# Patient Record
Sex: Female | Born: 1965 | Race: White | Hispanic: No | Marital: Married | Smoking: Never smoker
Health system: Southern US, Community
[De-identification: ages and names within clinical notes are randomized; demographics above are authoritative.]

## PROBLEM LIST (undated history)

## (undated) DIAGNOSIS — R569 Unspecified convulsions: Secondary | ICD-10-CM

## (undated) DIAGNOSIS — F419 Anxiety disorder, unspecified: Secondary | ICD-10-CM

## (undated) DIAGNOSIS — F32A Depression, unspecified: Secondary | ICD-10-CM

## (undated) DIAGNOSIS — M797 Fibromyalgia: Secondary | ICD-10-CM

## (undated) DIAGNOSIS — I209 Angina pectoris, unspecified: Secondary | ICD-10-CM

## (undated) DIAGNOSIS — M1990 Unspecified osteoarthritis, unspecified site: Secondary | ICD-10-CM

## (undated) DIAGNOSIS — Z803 Family history of malignant neoplasm of breast: Secondary | ICD-10-CM

## (undated) DIAGNOSIS — Z973 Presence of spectacles and contact lenses: Secondary | ICD-10-CM

## (undated) DIAGNOSIS — K859 Acute pancreatitis without necrosis or infection, unspecified: Secondary | ICD-10-CM

## (undated) DIAGNOSIS — Z8041 Family history of malignant neoplasm of ovary: Secondary | ICD-10-CM

## (undated) DIAGNOSIS — J189 Pneumonia, unspecified organism: Secondary | ICD-10-CM

## (undated) DIAGNOSIS — Q625 Duplication of ureter: Secondary | ICD-10-CM

## (undated) DIAGNOSIS — Z9889 Other specified postprocedural states: Secondary | ICD-10-CM

## (undated) DIAGNOSIS — R112 Nausea with vomiting, unspecified: Secondary | ICD-10-CM

## (undated) DIAGNOSIS — N200 Calculus of kidney: Secondary | ICD-10-CM

## (undated) DIAGNOSIS — E66811 Obesity, class 1: Secondary | ICD-10-CM

## (undated) DIAGNOSIS — E669 Obesity, unspecified: Secondary | ICD-10-CM

## (undated) DIAGNOSIS — Z800 Family history of malignant neoplasm of digestive organs: Secondary | ICD-10-CM

## (undated) DIAGNOSIS — T7840XA Allergy, unspecified, initial encounter: Secondary | ICD-10-CM

## (undated) DIAGNOSIS — IMO0001 Reserved for inherently not codable concepts without codable children: Secondary | ICD-10-CM

## (undated) DIAGNOSIS — I10 Essential (primary) hypertension: Secondary | ICD-10-CM

## (undated) DIAGNOSIS — Z87442 Personal history of urinary calculi: Secondary | ICD-10-CM

## (undated) DIAGNOSIS — S92009A Unspecified fracture of unspecified calcaneus, initial encounter for closed fracture: Secondary | ICD-10-CM

## (undated) DIAGNOSIS — I878 Other specified disorders of veins: Secondary | ICD-10-CM

## (undated) DIAGNOSIS — E119 Type 2 diabetes mellitus without complications: Secondary | ICD-10-CM

## (undated) DIAGNOSIS — C801 Malignant (primary) neoplasm, unspecified: Secondary | ICD-10-CM

## (undated) HISTORY — DX: Unspecified fracture of unspecified calcaneus, initial encounter for closed fracture: S92.009A

## (undated) HISTORY — PX: OTHER SURGICAL HISTORY: SHX169

## (undated) HISTORY — PX: TONSILLECTOMY: SHX5217

## (undated) HISTORY — PX: ABDOMINAL HYSTERECTOMY: SHX81

## (undated) HISTORY — PX: TONSILLECTOMY: SUR1361

## (undated) HISTORY — PX: KNEE CARTILAGE SURGERY: SHX688

## (undated) HISTORY — PX: LAMINECTOMY: SHX219

## (undated) HISTORY — DX: Family history of malignant neoplasm of digestive organs: Z80.0

## (undated) HISTORY — PX: CHOLECYSTECTOMY: SHX55

## (undated) HISTORY — DX: Obesity, class 1: E66.811

## (undated) HISTORY — DX: Allergy, unspecified, initial encounter: T78.40XA

## (undated) HISTORY — DX: Obesity, unspecified: E66.9

## (undated) HISTORY — PX: KNEE SURGERY: SHX244

## (undated) HISTORY — PX: SPINAL FUSION: SHX223

## (undated) HISTORY — DX: Family history of malignant neoplasm of ovary: Z80.41

## (undated) HISTORY — DX: Family history of malignant neoplasm of breast: Z80.3

---

## 1984-09-08 DIAGNOSIS — K859 Acute pancreatitis without necrosis or infection, unspecified: Secondary | ICD-10-CM

## 1984-09-08 DIAGNOSIS — R569 Unspecified convulsions: Secondary | ICD-10-CM

## 1984-09-08 HISTORY — DX: Acute pancreatitis without necrosis or infection, unspecified: K85.90

## 1984-09-08 HISTORY — DX: Unspecified convulsions: R56.9

## 1987-05-04 ENCOUNTER — Encounter: Payer: Self-pay | Admitting: Internal Medicine

## 1993-09-08 HISTORY — PX: BUNIONECTOMY: SHX129

## 1998-05-14 ENCOUNTER — Inpatient Hospital Stay (HOSPITAL_COMMUNITY): Admission: AD | Admit: 1998-05-14 | Discharge: 1998-05-20 | Payer: Self-pay | Admitting: Obstetrics and Gynecology

## 1998-06-27 ENCOUNTER — Encounter: Payer: Self-pay | Admitting: Urology

## 1998-06-27 ENCOUNTER — Ambulatory Visit (HOSPITAL_COMMUNITY): Admission: RE | Admit: 1998-06-27 | Discharge: 1998-06-27 | Payer: Self-pay | Admitting: Urology

## 1998-07-06 ENCOUNTER — Inpatient Hospital Stay (HOSPITAL_COMMUNITY): Admission: AD | Admit: 1998-07-06 | Discharge: 1998-07-06 | Payer: Self-pay | Admitting: Obstetrics and Gynecology

## 1998-07-10 ENCOUNTER — Inpatient Hospital Stay (HOSPITAL_COMMUNITY): Admission: AD | Admit: 1998-07-10 | Discharge: 1998-07-10 | Payer: Self-pay | Admitting: Obstetrics and Gynecology

## 1998-07-17 ENCOUNTER — Inpatient Hospital Stay (HOSPITAL_COMMUNITY): Admission: AD | Admit: 1998-07-17 | Discharge: 1998-07-17 | Payer: Self-pay | Admitting: *Deleted

## 1998-07-19 ENCOUNTER — Inpatient Hospital Stay (HOSPITAL_COMMUNITY): Admission: AD | Admit: 1998-07-19 | Discharge: 1998-07-22 | Payer: Self-pay | Admitting: Obstetrics and Gynecology

## 1998-07-19 ENCOUNTER — Inpatient Hospital Stay (HOSPITAL_COMMUNITY): Admission: AD | Admit: 1998-07-19 | Discharge: 1998-07-19 | Payer: Self-pay | Admitting: Obstetrics and Gynecology

## 1998-07-26 ENCOUNTER — Encounter (HOSPITAL_COMMUNITY): Admission: RE | Admit: 1998-07-26 | Discharge: 1998-10-24 | Payer: Self-pay | Admitting: *Deleted

## 1999-08-12 ENCOUNTER — Ambulatory Visit (HOSPITAL_COMMUNITY): Admission: RE | Admit: 1999-08-12 | Discharge: 1999-08-12 | Payer: Self-pay | Admitting: Internal Medicine

## 1999-08-12 ENCOUNTER — Encounter (INDEPENDENT_AMBULATORY_CARE_PROVIDER_SITE_OTHER): Payer: Self-pay

## 1999-08-13 ENCOUNTER — Encounter: Payer: Self-pay | Admitting: Emergency Medicine

## 1999-08-13 ENCOUNTER — Emergency Department (HOSPITAL_COMMUNITY): Admission: EM | Admit: 1999-08-13 | Discharge: 1999-08-14 | Payer: Self-pay | Admitting: Emergency Medicine

## 1999-08-23 ENCOUNTER — Inpatient Hospital Stay (HOSPITAL_COMMUNITY): Admission: EM | Admit: 1999-08-23 | Discharge: 1999-08-26 | Payer: Self-pay | Admitting: Emergency Medicine

## 1999-08-23 ENCOUNTER — Encounter (INDEPENDENT_AMBULATORY_CARE_PROVIDER_SITE_OTHER): Payer: Self-pay | Admitting: *Deleted

## 1999-08-24 ENCOUNTER — Encounter: Payer: Self-pay | Admitting: Internal Medicine

## 1999-08-24 ENCOUNTER — Encounter (INDEPENDENT_AMBULATORY_CARE_PROVIDER_SITE_OTHER): Payer: Self-pay | Admitting: *Deleted

## 1999-09-03 ENCOUNTER — Encounter: Payer: Self-pay | Admitting: Internal Medicine

## 1999-09-03 ENCOUNTER — Ambulatory Visit (HOSPITAL_COMMUNITY): Admission: RE | Admit: 1999-09-03 | Discharge: 1999-09-03 | Payer: Self-pay | Admitting: Internal Medicine

## 1999-12-29 ENCOUNTER — Emergency Department (HOSPITAL_COMMUNITY): Admission: EM | Admit: 1999-12-29 | Discharge: 1999-12-30 | Payer: Self-pay

## 2000-01-03 ENCOUNTER — Encounter: Payer: Self-pay | Admitting: Urology

## 2000-01-03 ENCOUNTER — Ambulatory Visit (HOSPITAL_COMMUNITY): Admission: RE | Admit: 2000-01-03 | Discharge: 2000-01-03 | Payer: Self-pay | Admitting: Urology

## 2003-07-27 ENCOUNTER — Encounter: Payer: Self-pay | Admitting: Internal Medicine

## 2003-08-17 ENCOUNTER — Encounter: Admission: RE | Admit: 2003-08-17 | Discharge: 2003-08-17 | Payer: Self-pay | Admitting: Internal Medicine

## 2003-08-17 ENCOUNTER — Encounter (INDEPENDENT_AMBULATORY_CARE_PROVIDER_SITE_OTHER): Payer: Self-pay | Admitting: *Deleted

## 2003-10-27 ENCOUNTER — Encounter: Payer: Self-pay | Admitting: Internal Medicine

## 2004-06-08 ENCOUNTER — Emergency Department (HOSPITAL_COMMUNITY): Admission: EM | Admit: 2004-06-08 | Discharge: 2004-06-09 | Payer: Self-pay | Admitting: Emergency Medicine

## 2005-03-02 ENCOUNTER — Emergency Department (HOSPITAL_COMMUNITY): Admission: EM | Admit: 2005-03-02 | Discharge: 2005-03-03 | Payer: Self-pay | Admitting: Emergency Medicine

## 2005-05-09 ENCOUNTER — Encounter: Admission: RE | Admit: 2005-05-09 | Discharge: 2005-05-09 | Payer: Self-pay | Admitting: Neurosurgery

## 2005-06-08 HISTORY — PX: LUMBAR LAMINECTOMY: SHX95

## 2005-06-12 ENCOUNTER — Encounter: Admission: RE | Admit: 2005-06-12 | Discharge: 2005-06-12 | Payer: Self-pay | Admitting: Neurosurgery

## 2005-07-01 ENCOUNTER — Ambulatory Visit (HOSPITAL_COMMUNITY): Admission: RE | Admit: 2005-07-01 | Discharge: 2005-07-02 | Payer: Self-pay | Admitting: Neurosurgery

## 2005-10-30 ENCOUNTER — Ambulatory Visit: Payer: Self-pay | Admitting: Family Medicine

## 2005-12-04 ENCOUNTER — Ambulatory Visit (HOSPITAL_BASED_OUTPATIENT_CLINIC_OR_DEPARTMENT_OTHER): Admission: RE | Admit: 2005-12-04 | Discharge: 2005-12-04 | Payer: Self-pay | Admitting: Urology

## 2006-04-08 ENCOUNTER — Encounter: Admission: RE | Admit: 2006-04-08 | Discharge: 2006-04-08 | Payer: Self-pay | Admitting: Neurosurgery

## 2007-08-02 ENCOUNTER — Ambulatory Visit: Payer: Self-pay | Admitting: Internal Medicine

## 2007-08-02 DIAGNOSIS — R42 Dizziness and giddiness: Secondary | ICD-10-CM | POA: Insufficient documentation

## 2007-08-02 DIAGNOSIS — J069 Acute upper respiratory infection, unspecified: Secondary | ICD-10-CM | POA: Insufficient documentation

## 2007-08-02 LAB — CONVERTED CEMR LAB: Rapid Strep: NEGATIVE

## 2007-08-03 ENCOUNTER — Telehealth (INDEPENDENT_AMBULATORY_CARE_PROVIDER_SITE_OTHER): Payer: Self-pay | Admitting: Internal Medicine

## 2007-09-06 ENCOUNTER — Ambulatory Visit (HOSPITAL_BASED_OUTPATIENT_CLINIC_OR_DEPARTMENT_OTHER): Admission: RE | Admit: 2007-09-06 | Discharge: 2007-09-06 | Payer: Self-pay | Admitting: Urology

## 2007-11-24 ENCOUNTER — Ambulatory Visit: Payer: Self-pay | Admitting: Family Medicine

## 2007-11-24 DIAGNOSIS — J309 Allergic rhinitis, unspecified: Secondary | ICD-10-CM | POA: Insufficient documentation

## 2007-11-24 DIAGNOSIS — J019 Acute sinusitis, unspecified: Secondary | ICD-10-CM | POA: Insufficient documentation

## 2007-11-26 ENCOUNTER — Encounter: Payer: Self-pay | Admitting: Internal Medicine

## 2007-11-26 DIAGNOSIS — K861 Other chronic pancreatitis: Secondary | ICD-10-CM | POA: Insufficient documentation

## 2007-11-26 DIAGNOSIS — J45909 Unspecified asthma, uncomplicated: Secondary | ICD-10-CM | POA: Insufficient documentation

## 2007-11-26 DIAGNOSIS — N200 Calculus of kidney: Secondary | ICD-10-CM | POA: Insufficient documentation

## 2007-11-26 DIAGNOSIS — K219 Gastro-esophageal reflux disease without esophagitis: Secondary | ICD-10-CM | POA: Insufficient documentation

## 2007-11-26 DIAGNOSIS — M129 Arthropathy, unspecified: Secondary | ICD-10-CM | POA: Insufficient documentation

## 2007-12-27 ENCOUNTER — Ambulatory Visit: Payer: Self-pay | Admitting: Family Medicine

## 2007-12-27 DIAGNOSIS — R55 Syncope and collapse: Secondary | ICD-10-CM | POA: Insufficient documentation

## 2007-12-27 DIAGNOSIS — L255 Unspecified contact dermatitis due to plants, except food: Secondary | ICD-10-CM | POA: Insufficient documentation

## 2008-01-11 ENCOUNTER — Ambulatory Visit: Payer: Self-pay | Admitting: Family Medicine

## 2008-01-11 DIAGNOSIS — R002 Palpitations: Secondary | ICD-10-CM | POA: Insufficient documentation

## 2008-03-21 ENCOUNTER — Ambulatory Visit: Payer: Self-pay | Admitting: Family Medicine

## 2008-04-04 ENCOUNTER — Encounter: Admission: RE | Admit: 2008-04-04 | Discharge: 2008-04-04 | Payer: Self-pay | Admitting: Sports Medicine

## 2008-05-16 ENCOUNTER — Ambulatory Visit: Payer: Self-pay | Admitting: Family Medicine

## 2008-05-16 DIAGNOSIS — R21 Rash and other nonspecific skin eruption: Secondary | ICD-10-CM | POA: Insufficient documentation

## 2008-05-17 ENCOUNTER — Telehealth (INDEPENDENT_AMBULATORY_CARE_PROVIDER_SITE_OTHER): Payer: Self-pay | Admitting: Internal Medicine

## 2008-06-14 ENCOUNTER — Encounter: Admission: RE | Admit: 2008-06-14 | Discharge: 2008-06-14 | Payer: Self-pay | Admitting: Sports Medicine

## 2008-06-21 ENCOUNTER — Emergency Department (HOSPITAL_COMMUNITY): Admission: EM | Admit: 2008-06-21 | Discharge: 2008-06-21 | Payer: Self-pay | Admitting: Emergency Medicine

## 2008-08-15 ENCOUNTER — Ambulatory Visit: Payer: Self-pay | Admitting: Family Medicine

## 2009-01-01 ENCOUNTER — Ambulatory Visit: Payer: Self-pay | Admitting: Family Medicine

## 2009-01-01 ENCOUNTER — Encounter (INDEPENDENT_AMBULATORY_CARE_PROVIDER_SITE_OTHER): Payer: Self-pay | Admitting: *Deleted

## 2009-01-01 DIAGNOSIS — I10 Essential (primary) hypertension: Secondary | ICD-10-CM | POA: Insufficient documentation

## 2009-01-10 ENCOUNTER — Ambulatory Visit: Payer: Self-pay | Admitting: Family Medicine

## 2009-01-23 ENCOUNTER — Ambulatory Visit: Payer: Self-pay | Admitting: Family Medicine

## 2009-03-01 ENCOUNTER — Telehealth (INDEPENDENT_AMBULATORY_CARE_PROVIDER_SITE_OTHER): Payer: Self-pay | Admitting: Internal Medicine

## 2009-03-02 ENCOUNTER — Ambulatory Visit: Payer: Self-pay | Admitting: Family Medicine

## 2009-05-10 ENCOUNTER — Ambulatory Visit: Payer: Self-pay | Admitting: Family Medicine

## 2009-05-10 LAB — CONVERTED CEMR LAB: Rapid Strep: NEGATIVE

## 2009-05-31 ENCOUNTER — Ambulatory Visit: Payer: Self-pay | Admitting: Family Medicine

## 2009-09-27 ENCOUNTER — Ambulatory Visit: Payer: Self-pay | Admitting: Family Medicine

## 2009-09-27 ENCOUNTER — Telehealth: Payer: Self-pay | Admitting: Family Medicine

## 2009-09-28 LAB — CONVERTED CEMR LAB
BUN: 12 mg/dL (ref 6–23)
CO2: 29 meq/L (ref 19–32)
Calcium: 8.8 mg/dL (ref 8.4–10.5)
Chloride: 103 meq/L (ref 96–112)
Creatinine, Ser: 0.6 mg/dL (ref 0.4–1.2)
GFR calc non Af Amer: 115.03 mL/min (ref >60)
Glucose, Bld: 101 mg/dL — ABNORMAL HIGH (ref 70–99)
Potassium: 3.8 meq/L (ref 3.5–5.1)
Sodium: 139 meq/L (ref 135–145)

## 2009-11-05 ENCOUNTER — Telehealth: Payer: Self-pay | Admitting: Family Medicine

## 2009-11-05 ENCOUNTER — Telehealth (INDEPENDENT_AMBULATORY_CARE_PROVIDER_SITE_OTHER): Payer: Self-pay | Admitting: *Deleted

## 2009-11-05 ENCOUNTER — Emergency Department (HOSPITAL_COMMUNITY): Admission: EM | Admit: 2009-11-05 | Discharge: 2009-11-05 | Payer: Self-pay | Admitting: Emergency Medicine

## 2009-11-05 DIAGNOSIS — R079 Chest pain, unspecified: Secondary | ICD-10-CM | POA: Insufficient documentation

## 2009-11-09 ENCOUNTER — Ambulatory Visit: Payer: Self-pay | Admitting: Cardiovascular Disease

## 2009-11-12 ENCOUNTER — Telehealth: Payer: Self-pay | Admitting: Family Medicine

## 2009-11-12 ENCOUNTER — Ambulatory Visit: Payer: Self-pay | Admitting: Internal Medicine

## 2009-11-12 ENCOUNTER — Encounter (INDEPENDENT_AMBULATORY_CARE_PROVIDER_SITE_OTHER): Payer: Self-pay | Admitting: *Deleted

## 2009-11-12 DIAGNOSIS — A088 Other specified intestinal infections: Secondary | ICD-10-CM | POA: Insufficient documentation

## 2009-12-17 ENCOUNTER — Ambulatory Visit: Payer: Self-pay | Admitting: Family Medicine

## 2009-12-24 ENCOUNTER — Telehealth: Payer: Self-pay | Admitting: Cardiovascular Disease

## 2010-01-22 DIAGNOSIS — R110 Nausea: Secondary | ICD-10-CM | POA: Insufficient documentation

## 2010-01-22 DIAGNOSIS — K2960 Other gastritis without bleeding: Secondary | ICD-10-CM | POA: Insufficient documentation

## 2010-01-22 DIAGNOSIS — Z87442 Personal history of urinary calculi: Secondary | ICD-10-CM | POA: Insufficient documentation

## 2010-01-22 DIAGNOSIS — F411 Generalized anxiety disorder: Secondary | ICD-10-CM | POA: Insufficient documentation

## 2010-01-22 DIAGNOSIS — K7689 Other specified diseases of liver: Secondary | ICD-10-CM | POA: Insufficient documentation

## 2010-01-22 DIAGNOSIS — R197 Diarrhea, unspecified: Secondary | ICD-10-CM | POA: Insufficient documentation

## 2010-01-22 DIAGNOSIS — F339 Major depressive disorder, recurrent, unspecified: Secondary | ICD-10-CM | POA: Insufficient documentation

## 2010-01-25 ENCOUNTER — Ambulatory Visit: Payer: Self-pay | Admitting: Internal Medicine

## 2010-01-25 DIAGNOSIS — M797 Fibromyalgia: Secondary | ICD-10-CM | POA: Insufficient documentation

## 2010-01-30 ENCOUNTER — Telehealth: Payer: Self-pay | Admitting: Family Medicine

## 2010-02-19 ENCOUNTER — Ambulatory Visit: Payer: Self-pay | Admitting: Internal Medicine

## 2010-02-19 ENCOUNTER — Encounter (INDEPENDENT_AMBULATORY_CARE_PROVIDER_SITE_OTHER): Payer: Self-pay | Admitting: *Deleted

## 2010-02-20 ENCOUNTER — Telehealth: Payer: Self-pay | Admitting: Internal Medicine

## 2010-02-21 ENCOUNTER — Encounter: Payer: Self-pay | Admitting: Internal Medicine

## 2010-04-11 ENCOUNTER — Telehealth: Payer: Self-pay | Admitting: Family Medicine

## 2010-04-25 ENCOUNTER — Telehealth: Payer: Self-pay | Admitting: Family Medicine

## 2010-04-26 ENCOUNTER — Ambulatory Visit: Payer: Self-pay | Admitting: Family Medicine

## 2010-04-26 DIAGNOSIS — R51 Headache: Secondary | ICD-10-CM | POA: Insufficient documentation

## 2010-04-26 DIAGNOSIS — R519 Headache, unspecified: Secondary | ICD-10-CM | POA: Insufficient documentation

## 2010-05-20 ENCOUNTER — Ambulatory Visit: Payer: Self-pay | Admitting: Family Medicine

## 2010-05-22 ENCOUNTER — Telehealth: Payer: Self-pay | Admitting: Family Medicine

## 2010-05-27 ENCOUNTER — Ambulatory Visit: Payer: Self-pay | Admitting: Family Medicine

## 2010-06-04 ENCOUNTER — Ambulatory Visit: Payer: Self-pay | Admitting: Family Medicine

## 2010-06-04 LAB — HM PAP SMEAR: HM Pap smear: NORMAL

## 2010-06-06 ENCOUNTER — Telehealth: Payer: Self-pay | Admitting: Family Medicine

## 2010-06-12 ENCOUNTER — Telehealth: Payer: Self-pay | Admitting: Family Medicine

## 2010-07-08 ENCOUNTER — Ambulatory Visit: Payer: Self-pay | Admitting: Family Medicine

## 2010-07-08 DIAGNOSIS — H6590 Unspecified nonsuppurative otitis media, unspecified ear: Secondary | ICD-10-CM

## 2010-09-28 ENCOUNTER — Encounter: Payer: Self-pay | Admitting: Internal Medicine

## 2010-09-28 ENCOUNTER — Encounter: Payer: Self-pay | Admitting: Neurosurgery

## 2010-10-07 ENCOUNTER — Ambulatory Visit
Admission: RE | Admit: 2010-10-07 | Discharge: 2010-10-07 | Payer: Self-pay | Source: Home / Self Care | Attending: Family Medicine | Admitting: Family Medicine

## 2010-10-07 ENCOUNTER — Encounter: Payer: Self-pay | Admitting: Family Medicine

## 2010-10-08 NOTE — Letter (Signed)
Summary: EGD Instructions  [facility]  [address] [place], [place] [address]   Phone: [phone]  Fax: [phone]       Melissa Allen    [birth year]    MRN: [id number]       Procedure Day /Date: 02/19/10 Tuesday     Arrival Time: 8:30 am     Procedure Time: 9:30 am     Location of Procedure:                    _ x _ [facility] (4th Floor)  PREPARATION FOR ENDOSCOPY   On 02/19/10 THE DAY OF THE PROCEDURE:  1.   No solid foods, milk or milk products are allowed after midnight the night before your procedure.  2.   Do not drink anything colored red or purple.  Avoid juices with pulp.  No orange juice.  3.  You may drink clear liquids until 7:30 am, which is 2 hours before your procedure.                                                                                                CLEAR LIQUIDS INCLUDE: Water Jello Ice Popsicles Tea (sugar ok, no milk/cream) Powdered fruit flavored drinks Coffee (sugar ok, no milk/cream) Gatorade Juice: apple, white grape, white cranberry  Lemonade Clear bullion, consomm, broth Carbonated beverages (any kind) Strained chicken noodle soup Hard Candy   MEDICATION INSTRUCTIONS  Unless otherwise instructed, you should take regular prescription medications with a small sip of water as early as possible the morning of your procedure.                   OTHER INSTRUCTIONS  You will need a responsible adult at least 46 years of age to accompany you and drive you home.   This person must remain in the waiting room during your procedure.  Wear loose fitting clothing that is easily removed.  Leave jewelry and other valuables at home.  However, you may wish to bring a book to read or an iPod/MP3 player to listen to music as you wait for your procedure to start.  Remove all body piercing jewelry and leave at home.  Total time from sign-in until discharge is approximately 2-3 hours.  You should go  home directly after your procedure and rest.  You can resume normal activities the day after your procedure.  The day of your procedure you should not:   Drive   Make legal decisions   Operate machinery   Drink alcohol   Return to work  You will receive specific instructions about eating, activities and medications before you leave.    The above instructions have been reviewed and explained to me by  Lamona Curl CMA [name])  Jan 25, 2010 5:16 PM     I fully understand and can verbalize these instructions _____________________________ Date 01/25/10

## 2010-10-08 NOTE — Assessment & Plan Note (Signed)
Summary: sinus problems   Vital Signs:  Patient profile:   45 year old female Height:      62.75 inches Weight:      162.4 pounds BMI:     29.10 Temp:     98.4 degrees F oral Pulse rate:   80 / minute Pulse rhythm:   regular BP sitting:   110 / 70  (left arm) Cuff size:   regular  Vitals Entered By: Benny Lennert CMA [name]) (December 17, 2009 11:30 AM)  History of Present Illness: Chief complaint sinus problems  45 year old female:  Threw up this morning no cong recently  Felt kind of weird this morning BP normal this morning  Felt a little dizzy. Face on R hurts   Some R sinus pain A little bit of runny some HA some R ear pain no diarrhea  REVIEW OF SYSTEMS GEN: Acute illness details above. CV: No chest pain or SOB GI: No noted N or V Otherwise, pertinent positives and negatives are noted in the HPI.   Allergies: 1)  ! Codeine 2)  ! * Tape Adhesive 3)  ! Hydrocodone 4)  ! Vicodin  Past History:  Past medical, surgical, family and social histories (including risk factors) reviewed, and no changes noted (except as noted below).  Past Medical History: Reviewed history from 11/12/2009 and no changes required. Allergic rhinitis Asthma GERD Calcaneal fracture -   MD rooster: ortho- [place] at [employer] [facility] [employer] [facility]  Past Surgical History: Reviewed history from 11/26/2007 and no changes required. tonsilectomy age 8 bunionectomy R) 13 years ago (11/04/05) 1st visit cholecystectomy 20 yrs ago back surg 10/06-- laminectomy L 4-5 --Ray knee surg x 2 Murphy/ Fannie Knee compressed pronator R)-- Sypher  Family History: Reviewed history and no changes required.  Social History: Reviewed history from 11/26/2007 and no changes required. 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  Throat clear, w/o exudate, + R LAD, R TM clear, L TM - good landmarks, No fluid present. rhinnorhea.  Left frontal and maxillary sinuses: nonTender Right frontal and maxillary sinuses: Tender, R  Neck: No ant or post LAD  CV: RRR, No M/G/R  Pulm: Breathing comfortably in no resp distress. no w/c/r  Abd: S,NT,ND,+BS  Extr: no c/c/e  Psych: full affect, pleasant    Impression & Recommendations:  Problem # 1:  SINUSITIS - ACUTE-NOS (ICD-461.9)  Her updated medication list for this problem includes:    Amoxicillin 500 Mg Tabs (Amoxicillin) .Marland KitchenMarland KitchenMarland KitchenMarland Kitchen 3 tabs by mouth two times a day (high dose sinusitis)  Complete Medication List: 1)  Sprintec 28 0.25-35 Mg-mcg Tabs (Norgestimate-eth estradiol) .... Take 1 tablet by mouth once a day 2)  Gabapentin 100 Mg Caps (Gabapentin) .... 2 by mouth once daily 3)  Trazodone Hcl 100 Mg Tabs (Trazodone hcl) .... Take 1 tablet at bedtime 4)  Cymbalta 60 Mg Cpep (Duloxetine hcl) .... Take 1 tab by mouth at bedtime 5)  Lisinopril 5 Mg Tabs (Lisinopril) .Marland Kitchen.. 1 daily forbp 6)  B-100 Tabs (Vitamins-lipotropics) .... Otc as directed. 7)  Hydrochlorothiazide 25 Mg Tabs (Hydrochlorothiazide) .... Take 1 tab by mouth every morning 8)  Nitrostat 0.4 Mg Subl (Nitroglycerin) .Marland Kitchen.. 1 tablet under tongue at onset of chest pain; you may repeat every 5 minutes for up to 3 doses. 9)  Diltiazem Hcl 30 Mg Tabs (Diltiazem hcl) .Marland Kitchen.. 1 tab by  mouth every 6 hours as needed 10)  Promethazine Hcl 25 Mg Tabs (Promethazine hcl) .Marland Kitchen.. 1 by mouth every 4 hours as needed for nausea or vomitting 11)  Amoxicillin 500 Mg Tabs (Amoxicillin) .... 3 tabs by mouth two times a day (high dose sinusitis) 12)  Diflucan 150 Mg Tabs (Fluconazole) .... Once daily  Patient Instructions: 1)  SINUSITIS 2)  Sinuses are cavities in facial skeleton that drain to nose. Impaired drainage and obstruction of sinus passages main cause. 3)  Treatment: 4)  1. Take all Antibiotics 5)  2. Open nasal and  sinus canals: Oral decongestant: Sudafed. (CAUTION IF HIGH BLOOD PRESSURE) 6)  3. Steam inhalation 7)  4. Humidifier in room 8)  5. Frequent nasal saline irrigation 9)  6. Moist heat compresses to face 10)  7. Tylenol or Ibuprofen for pain and fever, follow directions on bottle.  Prescriptions: DIFLUCAN 150 MG TABS (FLUCONAZOLE) once daily  #1 x 1   Entered and Authorized by:   Hannah Beat MD   Signed by:   Hannah Beat MD on 12/17/2009   Method used:   Electronically to        [facility]       [address]       [place], [place]  [address]       Ph: [phone]       Fax: [phone]   RxID:   [id number] AMOXICILLIN 500 MG TABS (AMOXICILLIN) 3 tabs by mouth two times a day (high dose sinusitis)  #60 x 0   Entered and Authorized by:   Hannah Beat MD   Signed by:   Hannah Beat MD on 12/17/2009   Method used:   Electronically to        [facility]       [address]       [place], [place]  [address]       Ph: [phone]       Fax: [phone]   RxID:   [id number]   Current Allergies (reviewed today): ! CODEINE ! * TAPE ADHESIVE ! HYDROCODONE ! VICODIN

## 2010-10-08 NOTE — Assessment & Plan Note (Signed)
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  She states that on Sunday, 5 days ago, she was awoken from sleep at 3 AM. She had 10 out of 10 severe mediastinal chest pain that lasted for 40 minutes. She did not seek help as she did not have any family close to her. She had a little bit of tightness during the day on Monday in her chest and at the urging of her husband, she went to [facility] emergency room. She had a chest x-ray, lab work and physical exam that was essentially normal. Through the course of the week, she denied any significant chest pain no state that it felt a little bit tight. No significant shortness of breath otherwise she feels back to normal.  She had a episode back in the fall with similar symptoms. She has a long history of GI erosions, pancreatitis though this was many years ago. She was on a cholesterol medicine though this has since been discontinued. She also has a history of fibromyalgia.  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  Preventive Screening-Counseling & Management  Alcohol-Tobacco     Alcohol drinks/day: <1     Smoking Status: never  Caffeine-Diet-Exercise     Caffeine use/day: 1 cup     Does Patient Exercise: no  Current Problems (verified): 1)  Chest Pain  (ICD-786.50) 2)  Hypertension  (ICD-401.9) 3)  Rash-nonvesicular  (ICD-782.1) 4)  Palpitations  (ICD-785.1) 5)  Contact Dermatitis&other Eczema Due To Plants  (ICD-692.6) 6)  Syncope  (ICD-780.2) 7)  Renal Calculus  (ICD-592.0) 8)  Arthritis  (ICD-716.90) 9)  Pancreatitis, Chronic  (ICD-577.1) 10)  Gerd  (ICD-530.81) 11)  Asthma  (ICD-493.90) 12)  Sinusitis- Acute-nos  (ICD-461.9) 13)  Allergic Rhinitis  (ICD-477.9) 14)  Uri   (ICD-465.9) 15)  Dizziness  (ICD-780.4)  Current Medications (verified): 1)  Sprintec 28 0.25-35 Mg-Mcg  Tabs (Norgestimate-Eth Estradiol) .... Take 1 Tablet By Mouth Once A Day 2)  Gabapentin 100 Mg Caps (Gabapentin) .... 2 By Mouth Once Daily 3)  Trazodone Hcl 100 Mg  Tabs (Trazodone Hcl) .... Take 1 Tablet At Bedtime 4)  Cymbalta 60 Mg  Cpep (Duloxetine Hcl) .... Take 1 Tab By Mouth At Bedtime 5)  Lisinopril 5 Mg Tabs (Lisinopril) .Marland Kitchen.. 1 Daily Forbp 6)  B-100  Tabs (Vitamins-Lipotropics) .... Otc As Directed. 7)  Hydrochlorothiazide 25 Mg  Tabs (Hydrochlorothiazide) .... Take 1 Tab By Mouth Every Morning  Allergies: 1)  ! Codeine 2)  ! * Tape Adhesive 3)  ! Hydrocodone 4)  ! 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  The patient denies fever, weight loss, weight gain, vision loss, decreased hearing, hoarseness, syncope, peripheral edema, prolonged cough, abdominal pain, incontinence, muscle weakness, depression, and enlarged lymph nodes.         Chest tightness  Vital Signs:  Patient profile:   45 year old female Height:      62.75 inches Weight:      166.25  pounds BMI:     29.79 Pulse rate:   96 / minute Pulse rhythm:   regular BP sitting:   124 / 86  (left arm) Cuff size:   regular  Vitals Entered By: Mercer Pod (November 09, 2009 3:13 PM)  Physical Exam  General:  well-appearing young woman in no apparent distress, HEENT exam is benign, neck is supple with no JVP or carotid bruits, heart sounds are regular with normal S1 and S2 and no murmurs appreciated, lungs are clear to auscultation with no wheezes Rales, abdominal exam is benign, she has no significant lower extremity edema, neurologic damage grossly nonfocal, skin is warm and dry, pulses are equal and symmetrical in her upper and lower extremities.    EKG  Procedure date:  11/09/2009  Findings:      normal sinus rhythm with rate of 96 beats per minute, no significant ST or T wave changes.  Impression & Recommendations:  Problem # 1:  CHEST PAIN (ICD-786.50) etiology of her chest pain is uncertain. It sounds somewhat atypical as it came on at nighttime at rest. She does have a remote GI history for a right lesion is/ulcers, pancreatitis. She's currently not on a proton pump inhibitor. She is also comment on having several months of recent minor discomfort in her left lower rib area like something is "moving". She's been active since her episode. EKG and cardiac enzymes on Monday, 24 hours after the event were normal.  her symptoms may be due to esophageal spasm or possibly even coronary spasm. It may even be due to a hiatal hernia. We have given her some nitroglycerin sublingual and diltiazem 30 mg that she did take on an as-needed basis. if she continues to have additional episodes of discomfort, we have suggested that she go to the emergency room after taking nitroglycerin. Additional items that should be ruled out include aortic dissection though we will hold off on any testing such as CT scan at this time given that she would be at low risk and is currently with no symptoms.  She also may need a EGD if she does have additional symptoms. Lastly we could always perform a stress test for repeat episodes and I have asked her to contact our office if we can be of further assistance.  Her updated medication list for this problem includes:    Lisinopril 5 Mg Tabs (Lisinopril) .Marland Kitchen... 1 daily forbp    Nitrostat 0.4 Mg Subl (Nitroglycerin) .Marland Kitchen... 1 tablet under tongue at onset of chest pain; you may repeat every 5 minutes for up to 3 doses.    Diltiazem Hcl 30 Mg Tabs (Diltiazem hcl) .Marland Kitchen... 1 tab by mouth every 6 hours as needed for chest pain  Problem # 2:  HYPERTENSION (ICD-401.9) blood pressure is well controlled on her current medication regimen.  Her updated medication list for this problem includes:    Lisinopril 5 Mg Tabs (Lisinopril) .Marland Kitchen... 1 daily forbp    Hydrochlorothiazide 25 Mg Tabs (Hydrochlorothiazide) .Marland KitchenMarland KitchenMarland KitchenMarland Kitchen  Take 1 tab by mouth every morning    Diltiazem Hcl 30 Mg Tabs (Diltiazem hcl) .Marland Kitchen... 1 tab by mouth every 6 hours as needed for chest pain Prescriptions: DILTIAZEM HCL 30 MG TABS (DILTIAZEM HCL) 1 tab by mouth every 6 hours as needed  #30 x 2   Entered by:   Charlena Cross, RN, BSN   Authorized by:   Dossie Arbour MD   Signed by:   Charlena Cross, RN, BSN on 11/09/2009   Method used:   Electronically to        [facility]       [address]       [place], [place]  [address]       Ph: [phone]       Fax: [phone]   RxID:   [id number] NITROSTAT 0.4 MG SUBL (NITROGLYCERIN) 1 tablet under tongue at onset of chest pain; you may repeat every 5 minutes for up to 3 doses.  #20 x 2   Entered by:   Charlena Cross, RN, BSN   Authorized by:   Dossie Arbour MD   Signed by:   Charlena Cross, RN, BSN on 11/09/2009   Method used:   [facility]       [address]       [place], [place]  [address]       Ph:  [phone]       Fax: [phone]   RxID:   [id number]

## 2010-10-08 NOTE — Assessment & Plan Note (Signed)
Summary: HURTS TO BREATHE   Vital Signs:  Patient profile:   45 year old female Height:      62.75 inches Weight:      175 pounds BMI:     31.36 Temp:     99.0 degrees F oral Pulse rate:   78 / minute Pulse rhythm:   regular BP sitting:   120 / 70  (right arm) Cuff size:   regular  Vitals Entered By: Linde Gillis CMA [name]) (June 04, 2010 12:20 PM) CC: hurts to breath   History of Present Illness: Pt here for pain with breathing and coughing on right side of chest. URI s/p Zpack, prednisone, finishing Augmentin. Overall feels much, much better but last two days, severe pain on right side of chest when she coughs or takes deep breaths.  No SOB>  Cough is no longer productive.     Current Medications (verified): 1)  Sprintec 28 0.25-35 Mg-Mcg  Tabs (Norgestimate-Eth Estradiol) .... Take 1 Tablet By Mouth Once A Day 2)  Gabapentin 400 Mg Caps (Gabapentin) .... One Tablet By Mouth Two Times A Day At Bedtime 3)  Trazodone Hcl 100 Mg  Tabs (Trazodone Hcl) .... Take 1 Tablet At Bedtime 4)  Cymbalta 60 Mg  Cpep (Duloxetine Hcl) .... Take 1 Tab By Mouth At Bedtime 5)  Lisinopril 10 Mg Tabs (Lisinopril) .... Take One Tablet By Mouth Daily 6)  B-100  Tabs (Vitamins-Lipotropics) .... Otc As Directed. 7)  Hydrochlorothiazide 25 Mg  Tabs (Hydrochlorothiazide) .... Take 1 Tab By Mouth Every Morning 8)  Nitrostat 0.4 Mg Subl (Nitroglycerin) .Marland Kitchen.. 1 Tablet Under Tongue At Onset of Chest Pain; You May Repeat Every 5 Minutes For Up To 3 Doses. 9)  Diltiazem Hcl 30 Mg Tabs (Diltiazem Hcl) .Marland Kitchen.. 1 Tab By Mouth Every 6 Hours As Needed 10)  Prilosec 20 Mg Cpdr (Omeprazole) .... Take 1 Tablet By Mouth Once A Day 11)  Carafate 1 Gm/31ml  Susp (Sucralfate) .Marland Kitchen.. 10 Cc Twice Daily X 2 Weeks Than As Needed 12)  Imitrex 100 Mg Tabs (Sumatriptan Succinate) .Marland Kitchen.. 1 By Mouth At The Onset of Migraine. May Repeat Dose in One Hour If Headache Not Resolved 13)  Tessalon Perles 100 Mg  Caps (Benzonatate) .Marland Kitchen.. 1  Tab By Mouth Three Times A Day As Needed Cough 14)  Proair Hfa 108 (90 Base) Mcg/act  Aers (Albuterol Sulfate) .... 2 Inh Q4h As Needed Shortness of Breath 15)  Prednisone 20 Mg Tabs (Prednisone) .... 3 Tabs By Mouth X 3 Days, 2 Tabs By Mouth X 3 Days, 1 Tab By Mouth X 2 Days Then Stop Dispense Qs 16)  Augmentin 875-125 Mg Tabs (Amoxicillin-Pot Clavulanate) .Marland Kitchen.. 1 By Mouth 2 Times Daily X 10 Days 17)  Tussin Pe 30-100 Mg/48ml Syrp (Pseudoephedrine-Guaifenesin) .... 5 Ml Two Times A Day As Needed Cough 18)  Tramadol Hcl 50 Mg  Tabs (Tramadol Hcl) .Marland Kitchen.. 1-2 Tab By Mouth Two Times A Day As Needed Pain  Allergies: 1)  ! Codeine 2)  ! * Tape Adhesive 3)  ! Hydrocodone 4)  ! Vicodin  Review of Systems      See HPI General:  Denies chills and fever. Resp:  Complains of cough; denies shortness of breath, sputum productive, and wheezing.  Physical Exam  General:  alert, well-developed, well-nourished, and cooperative to examination.    VSS, non toxic appearing. Ears:  much improved, R and L ear normal. Mouth:  teeth and gums in good repair; mucous membranes moist, without  lesions or ulcers. oropharynx clear without exudate,. Chest Wall:  Right inferior ribs TTP. Lungs:  Normal respiratory effort, chest expands symmetrically. Lungs are clear to auscultation, no crackles or wheezes. Heart:  Normal rate and regular rhythm. S1 and S2 normal without gallop, murmur, click, rub or other extra sounds. Psych:  normally interactive and good eye contact.     Impression & Recommendations:  Problem # 1:  RIB PAIN, RIGHT SIDED (ICD-786.50) Assessment New LIkely rib fracture.  Lung sounds clear, xray un necessary at this time as it will not change outcome and large percentage of rib fractures not visible on CXR. Will give cough suppressant, Tramadol as needed pain (codeine causes nausea). Follow up if symptoms worsen or do not improve over the next couple of weeks.  Complete Medication List: 1)   Sprintec 28 0.25-35 Mg-mcg Tabs (Norgestimate-eth estradiol) .... Take 1 tablet by mouth once a day 2)  Gabapentin 400 Mg Caps (Gabapentin) .... One tablet by mouth two times a day at bedtime 3)  Trazodone Hcl 100 Mg Tabs (Trazodone hcl) .... Take 1 tablet at bedtime 4)  Cymbalta 60 Mg Cpep (Duloxetine hcl) .... Take 1 tab by mouth at bedtime 5)  Lisinopril 10 Mg Tabs (Lisinopril) .... Take one tablet by mouth daily 6)  B-100 Tabs (Vitamins-lipotropics) .... Otc as directed. 7)  Hydrochlorothiazide 25 Mg Tabs (Hydrochlorothiazide) .... Take 1 tab by mouth every morning 8)  Nitrostat 0.4 Mg Subl (Nitroglycerin) .Marland Kitchen.. 1 tablet under tongue at onset of chest pain; you may repeat every 5 minutes for up to 3 doses. 9)  Diltiazem Hcl 30 Mg Tabs (Diltiazem hcl) .Marland Kitchen.. 1 tab by mouth every 6 hours as needed 10)  Prilosec 20 Mg Cpdr (Omeprazole) .... Take 1 tablet by mouth once a day 11)  Carafate 1 Gm/65ml Susp (Sucralfate) .Marland Kitchen.. 10 cc twice daily x 2 weeks than as needed 12)  Imitrex 100 Mg Tabs (Sumatriptan succinate) .Marland Kitchen.. 1 by mouth at the onset of migraine. may repeat dose in one hour if headache not resolved 13)  Tessalon Perles 100 Mg Caps (Benzonatate) .Marland Kitchen.. 1 tab by mouth three times a day as needed cough 14)  Proair Hfa 108 (90 Base) Mcg/act Aers (Albuterol sulfate) .... 2 inh q4h as needed shortness of breath 15)  Prednisone 20 Mg Tabs (Prednisone) .... 3 tabs by mouth x 3 days, 2 tabs by mouth x 3 days, 1 tab by mouth x 2 days then stop dispense qs 16)  Augmentin 875-125 Mg Tabs (Amoxicillin-pot clavulanate) .Marland Kitchen.. 1 by mouth 2 times daily x 10 days 17)  Tussin Pe 30-100 Mg/41ml Syrp (Pseudoephedrine-guaifenesin) .... 5 ml two times a day as needed cough 18)  Tramadol Hcl 50 Mg Tabs (Tramadol hcl) .Marland Kitchen.. 1-2 tab by mouth two times a day as needed pain Prescriptions: TRAMADOL HCL 50 MG  TABS (TRAMADOL HCL) 1-2 tab by mouth two times a day as needed pain  #60 x 0   Entered and Authorized by:   [name]  MD   Signed by:   [name] MD on 06/04/2010   Method used:   Print then Give to Patient   RxID:   [id number] TUSSIN PE 30-100 MG/5ML SYRP (PSEUDOEPHEDRINE-GUAIFENESIN) 5 ml two times a day as needed cough  #5 ounces x 0   Entered and Authorized by:   [name] MD   Signed by:   [name] MD on 06/04/2010   Method used:   Print then Give to Patient  RxID:   [id number]   Current Allergies (reviewed today): ! CODEINE ! * TAPE ADHESIVE ! HYDROCODONE ! VICODIN

## 2010-10-08 NOTE — Assessment & Plan Note (Signed)
Summary: BP ELEVATED, H/A,DIZZY/RI   Vital Signs:  Patient profile:   45 year old female Height:      62.75 inches Weight:      169.25 pounds BMI:     30.33 Temp:     98.7 degrees F oral Pulse rate:   88 / minute Pulse rhythm:   regular BP sitting:   142 / 94  (left arm) Cuff size:   regular  Vitals Entered By: Delilah Shan CMA (AAMA) (September 27, 2009 4:05 PM) CC: BP elevated, headache, dizzy   History of Present Illness: 45 yo with elevated BP, HA and dizziness for past few weeks. Has been taking HCTZ 12.5 mg and Lisinopril 5 mg for approx 2 years. BP at home has been in 150s/70s-90s. Has been having frontal headaches and a "floaty" feeling she had when she was first diagnosed with HTN. No focal neurological symptoms. No nasal congestion. No CP, SOB, LE edema.  Current Medications (verified): 1)  Sprintec 28 0.25-35 Mg-Mcg  Tabs (Norgestimate-Eth Estradiol) .... Take 1 Tablet By Mouth Once A Day 2)  Neurontin 100 Mg  Caps (Gabapentin) .... Take 1 Tab At Bedtime 3)  Trazodone Hcl 100 Mg  Tabs (Trazodone Hcl) .... Take 1 Tablet At Bedtime 4)  Cymbalta 60 Mg  Cpep (Duloxetine Hcl) .... Take 1 Tab By Mouth At Bedtime 5)  Lisinopril 5 Mg Tabs (Lisinopril) .Marland Kitchen.. 1 Daily Forbp 6)  B-100  Tabs (Vitamins-Lipotropics) .... Otc As Directed. 7)  Hydrochlorothiazide 25 Mg  Tabs (Hydrochlorothiazide) .... Take 1 Tab By Mouth Every Morning 8)  Hydrocodone-Acetaminophen 5-500 Mg Tabs (Hydrocodone-Acetaminophen) .Marland Kitchen.. 1 Tab Every 6 Hours As Needed For Pain. 9)  Meloxicam 15 Mg Tabs (Meloxicam) .... One By Mouth Daily  Allergies: 1)  ! Codeine 2)  ! * Tape Adhesive  Review of Systems      See HPI General:  Denies weakness. CV:  Denies chest pain or discomfort, lightheadness, near fainting, palpitations, shortness of breath with exertion, swelling of feet, swelling of hands, and weight gain. Neuro:  Complains of headaches; denies tremors, visual disturbances, and weakness.  Physical  Exam  General:  alert, well-developed, well-nourished, and well-hydrated.   Eyes:  pupils equal, pupils round, and no injection.   Neurologic:  alert & oriented X3 and gait normal.   Psych:  normally interactive and good eye contact.     Impression & Recommendations:  Problem # 1:  HYPERTENSION (ICD-401.9) Assessment Deteriorated Will increase HCTZ to 25 mg daily.  Have her keep a daily log and call me in one week.  Will also check a BMET today to rule out renal or electrolyte causes of increased BP that was previously controlled.  Pt expressed understanding with plan.   The following medications were removed from the medication list:    Hydrochlorothiazide 12.5 Mg Tabs (Hydrochlorothiazide) .Marland Kitchen... Take 1 tab  by mouth every morning Her updated medication list for this problem includes:    Lisinopril 5 Mg Tabs (Lisinopril) .Marland Kitchen... 1 daily forbp    Hydrochlorothiazide 25 Mg Tabs (Hydrochlorothiazide) .Marland Kitchen... Take 1 tab by mouth every morning  Orders: Venipuncture ([address]) TLB-BMP (Basic Metabolic Panel-BMET) (80048-METABOL)  Complete Medication List: 1)  Sprintec 28 0.25-35 Mg-mcg Tabs (Norgestimate-eth estradiol) .... Take 1 tablet by mouth once a day 2)  Neurontin 100 Mg Caps (Gabapentin) .... Take 1 tab at bedtime 3)  Trazodone Hcl 100 Mg Tabs (Trazodone hcl) .... Take 1 tablet at bedtime 4)  Cymbalta 60 Mg Cpep (Duloxetine hcl) .Marland KitchenMarland KitchenMarland Kitchen  Take 1 tab by mouth at bedtime 5)  Lisinopril 5 Mg Tabs (Lisinopril) .Marland Kitchen.. 1 daily forbp 6)  B-100 Tabs (Vitamins-lipotropics) .... Otc as directed. 7)  Hydrochlorothiazide 25 Mg Tabs (Hydrochlorothiazide) .... Take 1 tab by mouth every morning 8)  Hydrocodone-acetaminophen 5-500 Mg Tabs (Hydrocodone-acetaminophen) .Marland Kitchen.. 1 tab every 6 hours as needed for pain. 9)  Meloxicam 15 Mg Tabs (Meloxicam) .... One by mouth daily  Patient Instructions: 1)  We will increase HCTZ to 25 mg daily.  (can take two of your 12.5 mg until your run out). 2)  Check blood  pressure every day and call me in one week. Prescriptions: MELOXICAM 15 MG TABS (MELOXICAM) one by mouth daily  #30 x 0   Entered and Authorized by:   [name] MD   Signed by:   [name] MD on 09/27/2009   Method used:   Electronically to        [facility] ([facility]ail[facility]       [address]       , [place]  [address]       Ph: [phone]       Fax: [phone]   RxID:   [id number] HYDROCODONE-ACETAMINOPHEN 5-500 MG TABS (HYDROCODONE-ACETAMINOPHEN) 1 tab every 6 hours as needed for pain.  #30 x 0   Entered and Authorized by:   [name] MD   Signed by:   [name] MD on 09/27/2009   Method used:   Print then Give to Patient   RxID:   [id number] HYDROCHLOROTHIAZIDE 25 MG  TABS (HYDROCHLOROTHIAZIDE) Take 1 tab by mouth every morning  #90 x 3   Entered and Authorized by:   [name] MD   Signed by:   [name] MD on 09/27/2009   Method used:   Electronically to        [facility] ([facility])       [address]       [place], [place]  [address]       Ph: [phone]       Fax: [phone]   RxID:   [id number]   Current Allergies (reviewed today): ! CODEINE ! * TAPE ADHESIVE

## 2010-10-08 NOTE — Progress Notes (Signed)
  Called patient back this am 0800 courtesy call from procedure yesterday, patient states she is having pain "3" abdomen,but no fever,no swelling in abd. no n&v now,she did have some gas per pt. pt states she has not been to get carafate yet, so I encouraged her to get this medication and take this today also take it easy no heavy lifting and drink plenty of fluids today. Also told patient if she is not any better later today to call us back,patient seems to understand. She felt she would be ok she was going to work. Sherren Kerns RN  February 20, 2010 10:53 AM

## 2010-10-08 NOTE — Progress Notes (Signed)
----   Converted from flag ---- ---- 01/29/2010 11:58 AM, Melissa Beat MD wrote: Melissa Allen, I think that sounds reasonable. This is one of Melissa Allen's old pt's - I think Melissa Allen is her PCP now. I am going to forward on to her.   Thanks for all your help.  ---- 01/29/2010 11:00 AM, Melissa Arbour MD wrote: I was reviewing the chart on Melissa Allen. If she has a benign appearing EGD, I would consider a routine treadmill test. Don't think she would qualify for myoview given few risk factors and her age. Also if sx are frequent enough, we could consider long acting nitrates or diltiazem on regular basis for signs relief. ------------------------------

## 2010-10-08 NOTE — Letter (Signed)
Summary: Out of Work  [facility] at [facility]  [address] [place], [place] [address]   Phone: [phone]  Fax: [phone]    April 26, 2010   Employee:  Melissa Allen    To Whom It May Concern:   For Medical reasons, please excuse the above named employee from work for the following dates:  Start:   04/26/2010  End:   04/27/2010  If you need additional information, please feel free to contact our office.         Sincerely,    [name] MD

## 2010-10-08 NOTE — Progress Notes (Signed)
Summary: BP is up  Phone Note Call from Patient Call back at [phone]   Caller: Patient Call For: Melissa Mannan MD Summary of Call: Pt states her BP has been elevated.  Recent readings are 149/90, 140/99, 147/88.  She is asking if she needs to adjust her meds.  Follow-up for Phone Call        yes, let's increase her lisinopril to 10 mg daily. Please call in lisinopril 10 mg daily, #60, no refills. Melissa Mannan MD  April 11, 2010 9:30 AM  Left message on cell phone voicemail for patient to return call.  Linde Gillis CMA [name])  April 11, 2010 9:37 AM   Patient advised as instructed via telephone.  Rx sent to pharmacy.  Advised patient to keep a log of her BP readings and call us back in about 2 weeks with those readings.  Linde Gillis CMA [name])  April 11, 2010 11:47 AM      New/Updated Medications: LISINOPRIL 10 MG TABS (LISINOPRIL) take one tablet by mouth daily Prescriptions: LISINOPRIL 10 MG TABS (LISINOPRIL) take one tablet by mouth daily  #60 x 0   Entered by:   Linde Gillis CMA (AAMA)   Authorized by:   Melissa Mannan MD   Signed by:   Melissa Mannan MD on 04/11/2010   Method used:   Electronically to        [facility] ([facility])       [address]       [place], [place]  [address]       Ph: [phone]       Fax: [phone]   RxID:   [id number]   Current Allergies (reviewed today): ! CODEINE ! * TAPE ADHESIVE ! HYDROCODONE ! VICODIN

## 2010-10-08 NOTE — Progress Notes (Signed)
Summary: not any better  Phone Note Call from Patient Call back at [phone]   Caller: Patient Call For: [name] MD Summary of Call: Pt was seen on monday for a sinus infection.  She was given z-pack and tesselon and she says she is not any better.  Has a bad headache, taking imitrex for that. She says the headache is like the one she had a couple of weeks ago, that you are aware of.  I advised her to give abx more time to work but she says with z-pack she should feel better by now.  States the tesselon is not helping, has a lot of tightness and congestion in her chest.  Please advise.  [facility] [place]. Initial call taken by: Lowella Petties CMA,  May 22, 2010 8:08 AM  Follow-up for Phone Call        It has only been two days, abx need time to work.  This may also be viral, can take 7-10 days to resolve. [name] MD  May 22, 2010 8:15 AM  So [facility] to call.        Lowella Petties CMA  May 22, 2010 9:29 AM   Additional Follow-up for Phone Call Additional follow up Details #1::        Advised pt. Additional Follow-up by: Lowella Petties CMA,  May 22, 2010 1:06 PM

## 2010-10-08 NOTE — Assessment & Plan Note (Signed)
Summary: ear infection/dlo   Vital Signs:  Patient profile:   45 year old female Height:      62.75 inches Weight:      172 pounds BMI:     30.82 Temp:     98.6 degrees F oral Pulse rate:   84 / minute Pulse rhythm:   regular BP sitting:   110 / 70  (right arm) Cuff size:   regular  Vitals Entered By: Linde Gillis CMA [name]) (May 27, 2010 10:48 AM) CC: ear infection   History of Present Illness: Pt seen on 9/12 for URI symptoms.  Given Zpack, finished 3 days ago. Still coughing but less productive but sinus pressure and right ear pain are actually worse. Hearing diminished on right now. No fevers but very fatigued. Taking Sudafed with no relief of symptoms.  Current Medications (verified): 1)  Sprintec 28 0.25-35 Mg-Mcg  Tabs (Norgestimate-Eth Estradiol) .... Take 1 Tablet By Mouth Once A Day 2)  Gabapentin 400 Mg Caps (Gabapentin) .... One Tablet By Mouth Two Times A Day At Bedtime 3)  Trazodone Hcl 100 Mg  Tabs (Trazodone Hcl) .... Take 1 Tablet At Bedtime 4)  Cymbalta 60 Mg  Cpep (Duloxetine Hcl) .... Take 1 Tab By Mouth At Bedtime 5)  Lisinopril 10 Mg Tabs (Lisinopril) .... Take One Tablet By Mouth Daily 6)  B-100  Tabs (Vitamins-Lipotropics) .... Otc As Directed. 7)  Hydrochlorothiazide 25 Mg  Tabs (Hydrochlorothiazide) .... Take 1 Tab By Mouth Every Morning 8)  Nitrostat 0.4 Mg Subl (Nitroglycerin) .Marland Kitchen.. 1 Tablet Under Tongue At Onset of Chest Pain; You May Repeat Every 5 Minutes For Up To 3 Doses. 9)  Diltiazem Hcl 30 Mg Tabs (Diltiazem Hcl) .Marland Kitchen.. 1 Tab By Mouth Every 6 Hours As Needed 10)  Prilosec 20 Mg Cpdr (Omeprazole) .... Take 1 Tablet By Mouth Once A Day 11)  Carafate 1 Gm/90ml  Susp (Sucralfate) .Marland Kitchen.. 10 Cc Twice Daily X 2 Weeks Than As Needed 12)  Imitrex 100 Mg Tabs (Sumatriptan Succinate) .Marland Kitchen.. 1 By Mouth At The Onset of Migraine. May Repeat Dose in One Hour If Headache Not Resolved 13)  Tessalon Perles 100 Mg  Caps (Benzonatate) .Marland Kitchen.. 1 Tab By Mouth Three  Times A Day As Needed Cough 14)  Proair Hfa 108 (90 Base) Mcg/act  Aers (Albuterol Sulfate) .... 2 Inh Q4h As Needed Shortness of Breath 15)  Prednisone 20 Mg Tabs (Prednisone) .... 3 Tabs By Mouth X 3 Days, 2 Tabs By Mouth X 3 Days, 1 Tab By Mouth X 2 Days Then Stop Dispense Qs 16)  Augmentin 875-125 Mg Tabs (Amoxicillin-Pot Clavulanate) .Marland Kitchen.. 1 By Mouth 2 Times Daily X 10 Days  Allergies: 1)  ! Codeine 2)  ! * Tape Adhesive 3)  ! Hydrocodone 4)  ! Vicodin  Past History:  Past Medical History: Last updated: 01/22/2010 Allergic rhinitis Asthma GERD Calcaneal fracture -   MD roster: Jenna Luo at [facility]  Past Surgical History: Last updated: 01/22/2010 tonsillectomy age 39 bunionectomy R) 13 years ago (11/04/05) 1st visit cholecystectomy 20 yrs ago back surg 10/06-- laminectomy L 4-5 --Ray knee surg x 2 Murphy/ Fannie Knee compressed pronator R)-- Sypher multiple lithotripsies  Family History: Last updated: 01/25/2010 Family History of Heart Disease: Father Family History of Colon Cancer: Paternal Grandfather, Maternal Grandfather Family History of Colon Polyps: Father Family History of Uterine Cancer: Grandmother Family History of Breast Cancer: Grandmother Family History of Ovarian Cancer: Grandmother Family History of Diabetes: Mother  Social History: Last updated:  01/25/2010 Marital Status: Married Children: 3 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 ENT:  Complains of decreased hearing, earache, sinus pressure, and sore throat; denies ear discharge. Resp:  Complains of cough; denies wheezing.  Physical Exam  General:  alert,  well-developed, well-nourished, and cooperative to examination.    VSS, non toxic appearing. Ears:  L ear normal and R TM bulging.   Mouth:  teeth and gums in good repair; mucous membranes moist, without lesions or ulcers. oropharynx clear without exudate,. Lungs:  normal respiratory effort, no intercostal retractions or use of accessory muscles; scattered exp wheezes, right >left, no crackles. Heart:  Normal rate and regular rhythm. S1 and S2 normal without gallop, murmur, click, rub or other extra sounds. Extremities:  No clubbing, cyanosis, edema, or deformity noted with normal full range of motion of all joints.   Psych:  normally interactive and good eye contact.     Impression & Recommendations:  Problem # 1:  URI (ICD-465.9) Assessment Deteriorated Some signs of improvement with Zpack but right TM is now buldging with thick yellow fluid.  Will given Augmentin and short course of Prednisone.  Follow up next week, if no improvement, send to ENT for myringtomy. Her updated medication list for this problem includes:    Tessalon Perles 100 Mg Caps (Benzonatate) .Marland Kitchen... 1 tab by mouth three times a day as needed cough  Complete Medication List: 1)  Sprintec 28 0.25-35 Mg-mcg Tabs (Norgestimate-eth estradiol) .... Take 1 tablet by mouth once a day 2)  Gabapentin 400 Mg Caps (Gabapentin) .... One tablet by mouth two times a day at bedtime 3)  Trazodone Hcl 100 Mg Tabs (Trazodone hcl) .... Take 1 tablet at bedtime 4)  Cymbalta 60 Mg Cpep (Duloxetine hcl) .... Take 1 tab by mouth at bedtime 5)  Lisinopril 10 Mg Tabs (Lisinopril) .... Take one tablet by mouth daily 6)  B-100 Tabs (Vitamins-lipotropics) .... Otc as directed. 7)  Hydrochlorothiazide 25 Mg Tabs (Hydrochlorothiazide) .... Take 1 tab by mouth every morning 8)  Nitrostat 0.4 Mg Subl (Nitroglycerin) .Marland Kitchen.. 1 tablet under tongue at onset of chest pain; you may repeat every 5 minutes for up to 3 doses. 9)  Diltiazem Hcl 30 Mg Tabs  (Diltiazem hcl) .Marland Kitchen.. 1 tab by mouth every 6 hours as needed 10)  Prilosec 20 Mg Cpdr (Omeprazole) .... Take 1 tablet by mouth once a day 11)  Carafate 1 Gm/29ml Susp (Sucralfate) .Marland Kitchen.. 10 cc twice daily x 2 weeks than as needed 12)  Imitrex 100 Mg Tabs (Sumatriptan succinate) .Marland Kitchen.. 1 by mouth at the onset of migraine. may repeat dose in one hour if headache not resolved 13)  Tessalon Perles 100 Mg Caps (Benzonatate) .Marland Kitchen.. 1 tab by mouth three times a day as needed cough 14)  Proair Hfa 108 (90 Base) Mcg/act Aers (Albuterol sulfate) .... 2 inh q4h as needed shortness of breath 15)  Prednisone 20 Mg Tabs (Prednisone) .... 3 tabs by mouth x 3 days, 2 tabs by mouth x 3 days, 1 tab by mouth x 2 days then stop dispense qs 16)  Augmentin 875-125 Mg Tabs (Amoxicillin-pot clavulanate) .Marland Kitchen.. 1 by mouth 2 times  daily x 10 days Prescriptions: AUGMENTIN 875-125 MG TABS (AMOXICILLIN-POT CLAVULANATE) 1 by mouth 2 times daily x 10 days  #20 x 0   Entered and Authorized by:   [name] MD   Signed by:   [name] MD on 05/27/2010   Method used:   Electronically to        [facility] ([facility]       [address]       [place], [place]  [address]       Ph: [phone]       Fax: [phone]   RxID:   [id number] PREDNISONE 20 MG TABS (PREDNISONE) 3 tabs by mouth x 3 days, 2 tabs by mouth x 3 days, 1 tab by mouth x 2 days then stop dispense qs  #1 x 0   Entered and Authorized by:   [name] MD   Signed by:   [name] MD on 05/27/2010   Method used:   Electronically to        [facility] ([facility])       [address]       [place], [place]  [address]       Ph: [phone]       Fax: [phone]   RxID:   [id number]   Current Allergies (reviewed today): ! CODEINE ! * TAPE ADHESIVE ! HYDROCODONE ! VICODIN

## 2010-10-08 NOTE — Assessment & Plan Note (Signed)
Summary: DISCUSS BLOOD PRESSURE   Vital Signs:  Patient profile:   45 year old female Height:      62.75 inches Weight:      174.13 pounds BMI:     31.20 Temp:     98.2 degrees F oral Pulse rate:   68 / minute Pulse rhythm:   regular BP sitting:   130 / 92  (left arm) Cuff size:   regular  Vitals Entered By: Linde Gillis CMA [name]) (April 26, 2010 3:09 PM) CC: blood pressure concerns   History of Present Illness: 45 yo here to discuss blood pressure concerns.  Has been on HCTZ 25 mg and Lisinopril 10 mg daily with no issue. Past few days, she has had a headache and felt "funny."  Headache is associated with nausea and photophobia. When she checks her BP at home, the have been sporatic, anywhere for 150/110 to 122/70. No blurred vision, CP or LE edema.  Did have migraines when she was a teenager. Is under stress but no more than usual.  Current Medications (verified): 1)  Sprintec 28 0.25-35 Mg-Mcg  Tabs (Norgestimate-Eth Estradiol) .... Take 1 Tablet By Mouth Once A Day 2)  Gabapentin 400 Mg Caps (Gabapentin) .... One Tablet By Mouth Two Times A Day At Bedtime 3)  Trazodone Hcl 100 Mg  Tabs (Trazodone Hcl) .... Take 1 Tablet At Bedtime 4)  Cymbalta 60 Mg  Cpep (Duloxetine Hcl) .... Take 1 Tab By Mouth At Bedtime 5)  Lisinopril 10 Mg Tabs (Lisinopril) .... Take One Tablet By Mouth Daily 6)  B-100  Tabs (Vitamins-Lipotropics) .... Otc As Directed. 7)  Hydrochlorothiazide 25 Mg  Tabs (Hydrochlorothiazide) .... Take 1 Tab By Mouth Every Morning 8)  Nitrostat 0.4 Mg Subl (Nitroglycerin) .Marland Kitchen.. 1 Tablet Under Tongue At Onset of Chest Pain; You May Repeat Every 5 Minutes For Up To 3 Doses. 9)  Diltiazem Hcl 30 Mg Tabs (Diltiazem Hcl) .Marland Kitchen.. 1 Tab By Mouth Every 6 Hours As Needed 10)  Prilosec 20 Mg Cpdr (Omeprazole) .... Take 1 Tablet By Mouth Once A Day 11)  Carafate 1 Gm/44ml  Susp (Sucralfate) .Marland Kitchen.. 10 Cc Twice Daily X 2 Weeks Than As Needed 12)  Imitrex 100 Mg Tabs (Sumatriptan  Succinate) .Marland Kitchen.. 1 By Mouth At The Onset of Migraine. May Repeat Dose in One Hour If Headache Not Resolved  Allergies: 1)  ! Codeine 2)  ! * Tape Adhesive 3)  ! Hydrocodone 4)  ! Vicodin  Past History:  Past Medical History: Last updated: 01/22/2010 Allergic rhinitis Asthma GERD Calcaneal fracture -   MD roster: Jenna Luo at [facility]  Past Surgical History: Last updated: 01/22/2010 tonsillectomy age 72 bunionectomy R) 13 years ago (11/04/05) 1st visit cholecystectomy 20 yrs ago back surg 10/06-- laminectomy L 4-5 --Ray knee surg x 2 Murphy/ Fannie Knee compressed pronator R)-- Sypher multiple lithotripsies  Family History: Last updated: 01/25/2010 Family History of Heart Disease: Father Family History of Colon Cancer: Paternal Grandfather, Maternal Grandfather Family History of Colon Polyps: Father Family History of Uterine Cancer: Grandmother Family History of Breast Cancer: Grandmother Family History of Ovarian Cancer: Grandmother Family History of Diabetes: Mother  Social History: Last updated: 01/25/2010 Marital Status: Married Children: 3 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 Eyes:  Denies blurring. CV:  Denies chest pain or discomfort. Resp:  Denies shortness of breath. Neuro:  Complains of headaches; denies falling down, inability to speak, numbness, poor balance, seizures, sensation of room spinning, tingling, tremors, visual disturbances, and weakness.  Physical Exam  General:  alert, well-developed, well-nourished, and cooperative to examination.    Eyes:  No corneal or conjunctival inflammation noted. EOMI. Perrla. Funduscopic exam  benign, without hemorrhages, exudates or papilledema. Vision grossly normal. Lungs:  normal respiratory effort, no intercostal retractions or use of accessory muscles; normal breath sounds bilaterally - no crackles and no wheezes.    Heart:  slightly tachy rate, regular rhythm, no murmur, and no rub. BLE without edema.  normal cap refill in all 4 extremities    Extremities:  No clubbing, cyanosis, edema, or deformity noted with normal full range of motion of all joints.   Psych:  normally interactive and good eye contact.     Impression & Recommendations:  Problem # 1:  HEADACHE (ICD-784.0) Assessment New Seems most consistent with migraine headache. IM nubain given, Imitrex as needed over the weekend. Her updated medication list for this problem includes:    Imitrex 100 Mg Tabs (Sumatriptan succinate) .Marland Kitchen... 1 by mouth at the onset of migraine. may repeat dose in one hour if headache not resolved  Orders: Nubain/ Nalnuphine Inj. 10mg  (J2300) Admin of Therapeutic Inj  intramuscular or subcutaneous (04540)  Problem # 2:  HYPERTENSION (ICD-401.9) Assessment: Deteriorated Likely deteriorated from #1.  Advised to get an Omron cuff since they are usually more accurate and to bring her BP cuff to her next apponitment so we can compare. Her updated medication list for this problem includes:    Lisinopril 10 Mg Tabs (Lisinopril) .Marland Kitchen... Take one tablet by mouth daily    Hydrochlorothiazide 25 Mg Tabs (Hydrochlorothiazide) .Marland Kitchen... Take 1 tab by mouth every morning    Diltiazem Hcl 30 Mg Tabs (Diltiazem hcl) .Marland Kitchen... 1 tab by mouth every 6 hours as needed  Complete Medication List: 1)  Sprintec 28 0.25-35 Mg-mcg Tabs (Norgestimate-eth estradiol) .... Take 1 tablet by mouth once a day 2)  Gabapentin 400 Mg Caps (Gabapentin) .... One tablet by mouth two times a day at bedtime 3)  Trazodone Hcl 100 Mg Tabs (Trazodone hcl) .... Take 1 tablet at bedtime 4)  Cymbalta 60 Mg Cpep (Duloxetine hcl) .... Take 1  tab by mouth at bedtime 5)  Lisinopril 10 Mg Tabs (Lisinopril) .... Take one tablet by mouth daily 6)  B-100 Tabs (Vitamins-lipotropics) .... Otc as directed. 7)  Hydrochlorothiazide 25 Mg Tabs (Hydrochlorothiazide) .... Take 1 tab by mouth every morning 8)  Nitrostat 0.4 Mg Subl (Nitroglycerin) .Marland Kitchen.. 1 tablet under tongue at onset of chest pain; you may repeat every 5 minutes for up to 3 doses. 9)  Diltiazem Hcl 30 Mg Tabs (Diltiazem hcl) .Marland Kitchen.. 1 tab by mouth every 6 hours as needed 10)  Prilosec 20 Mg Cpdr (Omeprazole) .... Take 1 tablet by mouth once a day 11)  Carafate 1 Gm/56ml Susp (Sucralfate) .Marland Kitchen.. 10 cc twice daily x 2 weeks than as needed 12)  Imitrex 100 Mg Tabs (Sumatriptan succinate) .Marland Kitchen.. 1 by mouth at the onset of migraine. may repeat dose in one hour if headache not resolved  Patient Instructions: 1)  Omron blood pressure cuffs are more accurate. 2)  Please go to the ER of pain worsens over the weekend. Prescriptions: Willette Brace  100 MG TABS (SUMATRIPTAN SUCCINATE) 1 by mouth at the onset of migraine. May repeat dose in one hour if headache not resolved  #10 x 3   Entered and Authorized by:   [name] MD   Signed by:   [name] MD on 04/26/2010   Method used:   Electronically to        [facility]       [address]       [place], [place]  [address]       Ph: [phone]       Fax: [phone]   RxID:   [id number]   Current Allergies (reviewed today): ! CODEINE ! * TAPE ADHESIVE ! HYDROCODONE ! VICODIN   Medication Administration  Injection # 1:    Medication: Nubain/ Nalnuphine Inj. 10mg     Diagnosis: HEADACHE (ICD-784.0)    Route: IM    Site: RUOQ gluteus    Exp Date: 01/07/2011    Lot #: [id number]    Mfr: Hospira    Comments: 10 mg/mL given    Patient tolerated injection without complications    Given by: Linde Gillis CMA [name]) (April 26, 2010 3:57 PM)  Orders Added: 1)  Nubain/ Nalnuphine Inj.  10mg  [J2300] 2)  Admin of Therapeutic Inj  intramuscular or subcutaneous [96372] 3)  Est. Patient Level IV [25366]

## 2010-10-08 NOTE — Discharge Summary (Signed)
Summary: Nausea, Vomiting, Abdominal Pain                    [facility]  Patient:    Melissa Allen                       MRN: [id number] Adm. Date:  [id number] Disc. Date: 08/26/99 Attending:  Mervin Hack Dictator:   Dianah Field, P.A.                           Discharge Summary  ADMISSION DIAGNOSES:  1. Exacerbation of nausea, vomiting, abdominal pain.  2. Recent endoscopy showing bile reflux/alkaline gastritis.  3. Abdominal pain with low-grade fevers.  4. Dehydration secondary to protracted emesis.  5. 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  Periodic flares of these     symptoms.  6. Depression, anxiety.  7. Social emotional stressors, increased, contributing to exacerbation of her     gastrointestinal complaints.  8. Status post cholecystectomy.  9. History of nephrolithiasis versus ureterolithiasis, status post stent placement     with subsequent removal.  Rule out presenting symptoms secondary to     exacerbation of kidney stones.  Rule out urinary tract infection. 10. Status post childbirth x 3.  DISCHARGE DIAGNOSES:  1. Resolving exacerbation of gastrointestinal symptoms felt secondary to alkaline     gastritis.  2. Mild biliary ductal dilatation by CT imaging.  This may be normal     postcholecystectomy appearance versus pathological.  Liver function tests did     show a mild increase in total bilirubin.  Otherwise, these were within normal     limits.  3. Small hepatic and right renal cysts incidentally found on CT scan.  4. Dehydration, resolved.  CONSULTATIONS:  None.  PROCEDURES:  None.  LABORATORY DATA:  Amylase 117 and later measured 65, lipase 22.  Urinalysis showed normal specific gravity at 1.027 with no evidence for urine nitrites,  leukocyte  esterase, blood, or urine bilirubin.  Urinary ketones were increased at 80. CBC with hemoglobin of 15.7, hematocrit 44.8, and white blood cell count 11.5.  MCV  normal at 81.8 and platelets of 200.  Differential did show a left shift. Chemistries:  Sodium 138, potassium 3.6, glucose 97, BUN 14, creatinine 0.6. Bilirubin 1.1, alkaline phosphatase 63, AST 24, ALT 12.  IMAGING STUDIES:  CT scan of the abdomen and pelvis showing no evidence for pancreatic mass or pancreatitis.  Mild biliary ductal dilatation was noted, though no exact measurement given.  This made a normal postcholecystectomy appearance versus pathologic.  Small hepatic and right renal cyst noted.  In the pelvis, a  small amount of fluid in the cul-de-sac was noted.  This was probably physiologic. Otherwise, pelvic views within normal limits.  BRIEF HISTORY/HOSPITAL COURSE:  [name] is a 45 year old white female. She is a sixth grade teacher.  She has a long history of functional abdominal complaints, which have been severe at times, for which she has undergone multiple upper and  lower GI studies by Dr. Juanda Chance.  She has had CT scans, as well as ultrasounds, n the past.  She is status post cholecystectomy.  She has also been bothered by kidney stones and required stent placement in September 1999.  The patient has had recent flare of her upper GI symptoms with abdominal pain, nausea, and vomiting.  These symptoms had been quiescent for the past few years; however, there have four major deaths of people close to her and one of these being her loyal companion dog.  These deaths have occurred in the last three to four months and include two grandparents, the dog, and an aunt.  She says that her dog was "a better friend than her husband."  These losses, as well as marital strife, have contributed to emotional flares and seemed to have triggered increase in her upper GI symptoms.  She was seen by Dr.  Juanda Chance about 10 days ago and had upper endoscopy showing bile reflux gastritis.  Carafate was added to her medical regimen, which had included Prilosec and Phenergan and Mepergan as needed.  Two  days after the endoscopy, she was seen at the [facility] Emergency Room for the same symptoms with the pain being described as quite severe.  Her pain finally subsided after Dilaudid, along with some Phenergan.  She did not find Phenergan or Demerol alone as helpful as the Dilaudid.  Other new prescriptions include Lorazepam.  The patient has been seeing a clinical Child psychotherapist for marital discord and emotional problems.  She has been set up with a date to see a psychiatrist to assess for initiation of appropriate antidepressant therapy. In the meantime, she has been treated with the anxiolytics.  On the day of admission, the patient was able to eat only small amounts and keep some liquids down, but at about 2:30 in the afternoon she developed severe nausea and was continually throwing up bilious emesis.  She had been throwing up on and off for the past several days and it had moderate level abdominal pain. Symptoms became so severe that she came to the emergency room, where she was evaluated by Dr. Lina Sar and admitted for further care.  Once hospitalized on nontelemetry bed, she continued on IV fluids started in the emergency room.  Multiple labs had been obtained and were not very revealing, though she did have a leukocytosis and increased hemoglobin, which is probably secondary to some volume contraction related to her excessive emesis and inability to take adequate p.o.s.  the patient had not had any diarrhea.  In fact, she had had less output of stool owing to decreased p.o. intake.  Abdominal pain was subjectively intense, though on exam she did not display any guarding or rebound.  Over the course of about three days of admission, the patient slowly improved, o the  point where she was having some nausea but no further emesis.  The patients  bilirubin was slightly elevated and this, along with abdominal pain, prompted an abdominal pelvic CT.  This showed the ductal dilatation in the biliary ducts.  Although this is probably physiologic, it needs to be followed up.  No plans were made at present for ERCP studies.  After a couple of days, the patient was able to tolerate a diet consisting of soft foods.  IV fluids were slowly weaned and then discontinued.  Again, she was having some slight nausea on the day of discharge but was felt stable for return home.  She was to call the office for a followup office visit in about a weeks time with Dr. Juanda Chance.  DISCHARGE MEDICATIONS: 1. Prilosec 20 mg p.o. 1 to 2 daily. 2. Carafate 1 g p.o. a.c. and h.s. 3. Phenergan 25 mg p.o. q.4-6h.  p.r.n. 4. Mepergan p.r.n. 5. Ambien p.r.n. for sleep. 6. Lorazepam of unclear dosage as needed. 7. Klonopin as needed. 8. Oral contraceptive daily.  DIET:  Soft textured, bland foods until she was experiencing no nausea, at which point she could resume a regular diet.  ACTIVITIES:  As tolerated. DD:  08/26/99 TD:  08/28/99 Job: [id number]

## 2010-10-08 NOTE — Letter (Signed)
Summary: Out of Work  [facility]  [address] [place], [place] [address]   Phone: [phone]  Fax: [phone]    November 12, 2009   Employee:  MALERIE EAKINS    To Whom It May Concern:   For Medical reasons, please excuse the above named employee from work for the following dates:  Start: 11/12/09    End: 11/14/09, may return to work on Wednesday 11/14/09    If you need additional information, please feel free to contact our office.         Sincerely,    Dr. Rene Paci

## 2010-10-08 NOTE — Letter (Signed)
Summary: Patient [facility] Biopsy Results   Gastroenterology  [address] [place], [place] [address]   Phone: [phone]  Fax: [phone]        February 21, 2010 MRN: [id number]    ICELYN NAVARRETE [address] [place], [place]  [address]    Dear Ms. Neuner,  I am pleased to inform you that the biopsies taken during your recent endoscopic examination did not show any evidence of cancer upon pathologic examination.The biopsies show mild gastritis and esophagitis ddue to reflux,  Additional information/recommendations:  __No further action is needed at this time.  Please follow-up with      your primary care physician for your other healthcare needs.  __ Please call [phone] to schedule a return visit to review      your condition.  x__ Continue with the treatment plan as outlined on the day of your      exam.     Please call us if you are having persistent problems or have questions about your condition that have not been fully answered at this time.  Sincerely,  Hart Carwin MD  This letter has been electronically signed by your physician.  Appended Document: Patient Notice-Endo Biopsy Results letter mailed.

## 2010-10-08 NOTE — Progress Notes (Signed)
Summary: reporting BP readings  Phone Note Call from Patient Call back at [phone]   Caller: Patient Call For: [name] MD Summary of Call:  Pt called to report her BP readings.  She said she did well for about 4 days with the increased dose of lisinopril, but then her readings started going up.  ON 8/15 it was 147/97.  On 8/16 she got 142/97, 139/77, 118/104, 117/111, 126/84, 125/90, 139/92.  Yesterday it was 122/86, 147/108 and this morning 139/99.  She says she has a strange floaty feeling in her head, feels like a weight is on the back of her head. Please advise. Initial call taken by: Lowella Petties CMA,  April 25, 2010 9:00 AM  Follow-up for Phone Call        please have her make appt to be seen. [name] MD  April 25, 2010 9:10 AM  [facility] asking pt to call to schedule appt.        Lowella Petties CMA  April 25, 2010 9:25 AM   Additional Follow-up for Phone Call Additional follow up Details #1::        Appt scheduled                Lowella Petties CMA  April 25, 2010 11:00 AM

## 2010-10-08 NOTE — Progress Notes (Signed)
Summary: not any better  Phone Note Call from Patient Call back at Work Phone [phone]   Caller: Patient Call For: Melissa Mannan MD Summary of Call: Pt was seen the other day for cough and chest pain.  She is not any better, still has a lot of pain- even with pain meds.  Hurts to breathe.  Please advise. Initial call taken by: Lowella Petties CMA,  June 06, 2010 8:28 AM  Follow-up for Phone Call        Rib fractures take weeks to resolve.  Unfortunately there is nothing else we can do besides pain management.  If short of breath, not just because it hurts to take deep breaths, needs to be seen. Melissa Mannan MD  June 06, 2010 8:32 AM   Additional Follow-up for Phone Call Additional follow up Details #1::        Advised pt. Additional Follow-up by: Lowella Petties CMA,  June 06, 2010 8:42 AM

## 2010-10-08 NOTE — Assessment & Plan Note (Signed)
Summary: epigastric pain....em                    (4pm appt)   History of Present Illness Visit Type: consult  Primary GI MD: Lina Sar MD Primary Provider: [name], MD Requesting Provider: Antonieta Iba, MD  Chief Complaint: Chest pain, History of Present Illness:   This is a 45 year old white female with 2 episodes of chest pain. 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 The pain was in the midline and it was squeezing. It did not radiate to her back. She has a history of irritable bowel syndrome since 1986 when she was also hospitalized with pancreatitis. She had an open cholecystectomy and had recurrent episodes of nausea and vomiting. She had a normal  ERCP in 1986 and again in 2000 . The last ERCP  showed a mild distal common bile duct stricture vs. spasm. Patient's last upper endoscopy in November 2004 showed bile reflux gastritis. She has been recently evaluated by Dr.Gollan and felt to have a noncardiac chest pain; she denies any dysphagia. She has taken gabapentin, trazodone and srintec for fibromyalgia.   GI Review of Systems    Reports chest pain.      Denies abdominal pain, acid reflux, belching, bloating, dysphagia with liquids, dysphagia with solids, heartburn, loss of appetite, nausea, vomiting, vomiting blood, weight loss, and  weight gain.        Denies anal fissure, black tarry stools, change in bowel habit, constipation, diarrhea, diverticulosis, fecal incontinence, heme positive stool, hemorrhoids, irritable bowel syndrome, jaundice, light color stool, liver problems, rectal bleeding, and  rectal pain.    Current Medications (verified): 1)  Sprintec 28 0.25-35 Mg-Mcg  Tabs (Norgestimate-Eth Estradiol) .... Take 1 Tablet By Mouth Once A Day 2)  Gabapentin 400 Mg Caps (Gabapentin) .... One Tablet By Mouth Two Times A Day At Bedtime 3)  Trazodone Hcl  100 Mg  Tabs (Trazodone Hcl) .... Take 1 Tablet At Bedtime 4)  Cymbalta 60 Mg  Cpep (Duloxetine Hcl) .... Take 1 Tab By Mouth At Bedtime 5)  Lisinopril 5 Mg Tabs (Lisinopril) .Marland Kitchen.. 1 Daily Forbp 6)  B-100  Tabs (Vitamins-Lipotropics) .... Otc As Directed. 7)  Hydrochlorothiazide 25 Mg  Tabs (Hydrochlorothiazide) .... Take 1 Tab By Mouth Every Morning 8)  Nitrostat 0.4 Mg Subl (Nitroglycerin) .Marland Kitchen.. 1 Tablet Under Tongue At Onset of Chest Pain; You May Repeat Every 5 Minutes For Up To 3 Doses. 9)  Diltiazem Hcl 30 Mg Tabs (Diltiazem Hcl) .Marland Kitchen.. 1 Tab By Mouth Every 6 Hours As Needed  Allergies (verified): 1)  ! Codeine 2)  ! * Tape Adhesive 3)  ! Hydrocodone 4)  ! Vicodin  Past History:  Past Medical History: Reviewed history from 01/22/2010 and no changes required. Allergic rhinitis Asthma GERD Calcaneal fracture -   MD roster: Jenna Luo at [facility]  Past Surgical History: Reviewed history from 01/22/2010 and no changes required. tonsillectomy age 30 bunionectomy R) 13 years ago (11/04/05) 1st visit cholecystectomy 20 yrs ago back surg 10/06-- laminectomy L 4-5 --Ray knee surg x 2 Murphy/ Fannie Knee compressed pronator R)-- Sypher multiple lithotripsies  Family History: Reviewed history from 01/25/2010 and no changes required. 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  The patient denies anemia, blood in urine, breast changes/lumps, confusion, cough, coughing up blood, depression-new, fainting, fever, headaches-new, hearing problems, heart murmur, heart rhythm changes, itching, muscle pains/cramps, night sweats, nosebleeds, pregnancy symptoms, skin rash, sleeping problems, sore throat, swollen lymph glands, thirst - excessive , urination - excessive , urination changes/pain, urine leakage, and voice change.         Pertinent positive and negative review of systems were noted in the above HPI. All other ROS was otherwise negative.   Vital Signs:  Patient profile:   45 year old female Height:      62.75 inches Weight:      166 pounds BMI:     29.75 BSA:     1.78 Pulse rate:   88 / minute Pulse rhythm:   regular BP sitting:   108 / 76  (left arm) Cuff size:   regular  Vitals Entered By: Ok Anis CMA (Jan 25, 2010 3:43 PM)  Physical Exam  General:  Well developed, well nourished, no acute distress. Eyes:  PERRLA, no icterus. Mouth:  No deformity or lesions, dentition normal. Neck:  Supple; no masses or thyromegaly. Lungs:  Clear throughout to auscultation. Heart:  Regular rate and rhythm; no murmurs, rubs,  or bruits. Abdomen:  mildly protuberant abdomen. Soft. Tenderness in right middle quadrant. Post cholecystectomy scar in right upper quadrant. No fluid wave. Pulses:  Normal pulses noted. Extremities:  No clubbing, cyanosis, edema or deformities noted. Skin:  Intact without significant lesions or rashes. Psych:  Alert and cooperative. Normal mood and affect.   Impression & Recommendations:  Problem # 1:  Hx of REFLUX GASTRITIS (ICD-535.40) Patient has had recent episodes of chest pain suggestive of non-cardiac chest pain from gastroesophageal reflux. She has a long history of gastrointestinal functional disorder. We will proceed with  an upper endoscopy to assess for Barrett's esophagus, hiatal hernia or reflux esophagitis. We will start her on Prilosec 20 mg daily until she is able to schedule her  endoscopy when the  school ends on June 10.  Problem # 2:  DIARRHEA (ICD-787.91) Patient has irritable bowel syndrome by history. She has diarrhea alternating with constipation. There are no risk factors for colon cancer.   I suggest she have a screening colonoscopy at age 22.  Other Orders: EGD (EGD)  Patient Instructions: 1)  Prilosec 20 mg daily. 2)  Antireflux measures. 3)  Upper endoscopy scheduled after June 10. 4)  Consider HIDA scan to rule out possibility of common bile duct stones depending on results of upper endoscopy. 5)  Copy sent to : Dr Mariah Milling 6)  The medication list was reviewed and reconciled.  All changed / newly prescribed medications were explained.  A complete medication list was provided to the patient / caregiver. Prescriptions: PRILOSEC 20 MG CPDR (OMEPRAZOLE) Take 1 tablet by mouth once a day  #30 x 0   Entered by:   Lamona Curl CMA (AAMA)   Authorized by:   Hart Carwin MD  Signed by:   Lamona Curl CMA (AAMA) on 01/25/2010   Method used:   Electronically to        [facility] (retail)       [address]       [place], [place]  [address]       Ph: [phone]       Fax: [phone]   RxID:   [id number]

## 2010-10-08 NOTE — Procedures (Signed)
Summary: ENDOSCOPY   EGD  Procedure date:  07/27/2003  Findings:      Location: [facility]     Patient Name: Melissa Allen, Melissa Allen MRN:  Procedure Procedures: Panendoscopy (EGD) CPT: 43235.    with biopsy(s)/brushing(s). CPT: D1846139.  Personnel: Endoscopist: Kartier Bennison L. Juanda Chance, MD.  Exam Location: Exam performed in Outpatient Clinic. Outpatient  Patient Consent: Procedure, Alternatives, Risks and Benefits discussed, consent obtained, from patient. Consent was obtained by the RN.  Indications Symptoms: Weight loss. Chest Pain. Vomiting. Abdominal pain, location: epigastric.  Surveillance of: Last exam: Dec, 2000.  History  Current Medications: Patient is not currently taking Coumadin.  Pre-Exam Physical: Performed Jul 27, 2003  Cardio-pulmonary exam, HEENT exam, Abdominal exam, Extremity exam, Neurological exam, Mental status exam WNL.  Exam Exam Info: Maximum depth of insertion Duodenum, intended Duodenum. Vocal cords visualized. Gastric retroflexion performed. Images taken. ASA Classification: I. Tolerance: good.  Sedation Meds: Patient assessed and found to be appropriate for moderate (conscious) sedation. Fentanyl 100 mcg. given IV. Versed 8 mg. given IV. Cetacaine Spray 2 sprays given aerosolized.  Monitoring: BP and pulse monitoring done. Oximetry used. Supplemental O2 given  Findings - OTHER FINDING: bile reflyux in Body. RUT done, results pending.   - MUCOSAL ABNORMALITY: Body to Antrum. Erosions present. Hemorrhage (oozing). Erythematous mucosa. Friable mucosa. done, results pending.   Assessment Abnormal examination, see findings above.  Comments: no outlet obstruction Events  Unplanned Intervention: No unplanned interventions were required.  Unplanned Events: There were no complications. Plans Medication(s): Await pathology. PPI: Aciphex 20 mg BID, starting Jul 27, 2003  Carafate: 1gm QID, starting Jul 27, 2003  Zofran: 4mg  prn,  starting Jul 27, 2003  Librax: 1 QID, starting Jul 27, 2003   Comments: full liquid diet, consider adding reglan Disposition: After procedure patient sent to recovery. After recovery patient sent home.    CC: Kyra Manges.MD  This report was created from the original endoscopy report, which was reviewed and signed by the above listed endoscopist.   Appended Document: ENDOSCOPY RUT ---- NEGATIVE

## 2010-10-08 NOTE — Letter (Signed)
Summary: Out of Work  [facility]  [address] [place], [place] [address]   Phone: [phone]  Fax: [phone]    December 17, 2009   Employee:  BEKKA QIAN    To Whom It May Concern:   For Medical reasons, please excuse the above named employee from work for the following dates:  Start:   12/17/2009  End:   12/19/2009  If you need additional information, please feel free to contact our office.         Sincerely,    Hannah Beat MD

## 2010-10-08 NOTE — Procedures (Signed)
Summary: Colonoscopy/[place]  Colonoscopy/   Imported By: Sherian Rein 01/25/2010 10:51:55  _____________________________________________________________________  External Attachment:    Type:   Image     Comment:   External Document

## 2010-10-08 NOTE — Procedures (Signed)
Summary: Upper Endoscopy  Patient: Melissa Allen Note: All result statuses are Final unless otherwise noted.  Tests: (1) Upper Endoscopy (EGD)   EGD Upper Endoscopy       DONE     [facility]     [address]     [place], [place]  [address]           ENDOSCOPY PROCEDURE REPORT           PATIENT:  Melissa Allen, Melissa Allen  MR#:  [id number]     BIRTHDATE:  [birth year], 44 yrs. old  GENDER:  female           ENDOSCOPIST:  Hedwig Morton. Juanda Chance, MD     Referred by:  [name], M.D.           PROCEDURE DATE:  02/19/2010     PROCEDURE:  EGD with biopsy     ASA CLASS:  Class I     INDICATIONS:  chest pain, abdominal pain hx of pancreatitis 1986,           hx of functional abd. pain/IBS     last EGD 2004 bile gastritis           MEDICATIONS:   Versed 7 mg, Fentanyl 50 mcg     TOPICAL ANESTHETIC:  Exactacain Spray           DESCRIPTION OF PROCEDURE:   After the risks benefits and     alternatives of the procedure were thoroughly explained, informed     consent was obtained.  The LB [id number] [id number] endoscope was     introduced through the mouth and advanced to the second portion of     the duodenum, without limitations.  The instrument was slowly     withdrawn as the mucosa was fully examined.     <<PROCEDUREIMAGES>>           irregular Z-line. slightly irregular z-line but no evidence of     esophagitis or hiatal hernia With standard forceps, a biopsy was     obtained and sent to pathology (see image1 and image2).  Mild     gastritis was found. bile reflux With standard forceps, a biopsy     was obtained and sent to pathology. r/o H (see image8, image7,     image4, and image3).Pylori  Otherwise the examination was normal     (see image5 and image6).    Retroflexed views revealed no     abnormalities.    The scope was then withdrawn from the patient     and the procedure completed.           COMPLICATIONS:  None           ENDOSCOPIC IMPRESSION:     1) Irregular Z-line     2) Mild  gastritis     3) Otherwise normal examination     nothing to account for the chest pain,  possible acid or bile     reflux may cause chest pains,  would add Carafate 1 gm bid x 2     weeks,  then prn, # 30, 1 refill     continue Prelosec 20 mg po qd     RECOMMENDATIONS:     1) Await biopsy results           REPEAT EXAM:  In 0 year(s) for.  cc Dr Mariah Milling           ______________________________     Hedwig Morton.  Juanda Chance, MD           CC:           n.     eSIGNED:   Hedwig Morton. Phoenyx Paulsen at 02/19/2010 10:04 AM           Gavin Pound, [id number]  Note: An exclamation mark (!) indicates a result that was not dispersed into the flowsheet. Document Creation Date: 02/19/2010 10:05 AM _______________________________________________________________________  (1) Order result status: Final Collection or observation date-time: 02/19/2010 09:52 Requested date-time:  Receipt date-time:  Reported date-time:  Referring Physician:   Ordering Physician: Lina Sar [phone]) Specimen Source:  Source: Launa Grill Order Number: [id number] Lab site:

## 2010-10-08 NOTE — Progress Notes (Signed)
Summary: Elevated BP, h/a  Phone Note Call from Patient   Caller: Patient Call For: Dr. Dayton Martes Summary of Call: On 06/26/10 pt's BP was 140/90, pt said feeling bad, h/a, and sharp mid chest pain last night x1. today pt's BP is 150/90  with h/a, dizzy, no SOB and no chest pain today. Dr. Dayton Martes can see this afternoon at 4:00pm. Pt said was not sure could wait that long. Dr.  Dayton Martes said she would be glad to see pt at 4:00 but could not see this AM. All other providers were full this morning so Dr. Dayton Martes said if pt needed to be seen before $pm  pt could go to urgent care or ER. Pt said thought she could wait until 4pm to see Dr. Dayton Martes. Initial call taken by: Lewanda Rife LPN,  September 27, 2009 9:57 AM

## 2010-10-08 NOTE — Progress Notes (Signed)
Summary: needs referral   Phone Note Call from Patient Call back at [phone]   Caller: Patient Call For: Dr. Hetty Ely Summary of Call: Patient went to [facility] Emergency room. She said that everything that they did was negative. They told her she needed to follow up with cardiologist. She needs referral.  Initial call taken by: Melody Comas,  November 05, 2009 3:05 PM  Follow-up for Phone Call        Appt made with Dr Mariah Milling on 11/09/2009 at 2:45pm. Follow-up by: Carlton Adam,  November 05, 2009 4:49 PM  New Problems: CHEST PAIN (ICD-786.50)   New Problems: CHEST PAIN (ICD-786.50)

## 2010-10-08 NOTE — Miscellaneous (Signed)
Summary: rx for carafate  Clinical Lists Changes  Medications: Added new medication of CARAFATE 1 GM/10ML  SUSP (SUCRALFATE) 10 cc twice daily x 2 weeks than as needed - Signed Rx of CARAFATE 1 GM/10ML  SUSP (SUCRALFATE) 10 cc twice daily x 2 weeks than as needed;  #30 x 1;  Signed;  Entered by: Oda Cogan RN;  Authorized by: Hart Carwin MD;  Method used: Electronically to [facility] [address], [place], [place], [place]  [address], Ph: [phone], Fax: [phone]    Prescriptions: CARAFATE 1 GM/10ML  SUSP (SUCRALFATE) 10 cc twice daily x 2 weeks than as needed  #30 x 1   Entered by:   Oda Cogan RN   Authorized by:   Hart Carwin MD   Signed by:   Oda Cogan RN on 02/19/2010   Method used:   Electronically to        [facility]       [address]       [place], [place]  [address]       Ph: [phone]       Fax: [phone]   RxID:   [id number]

## 2010-10-08 NOTE — Assessment & Plan Note (Signed)
Summary: Gastroenterology  [facility]  MR#:  [id number] Page #  NAME:  Melissa Allen, Melissa Allen   OFFICE NO:  [id number]  DATE:  10/27/03  DOB: 2065-10-07   HISTORY OF PRESENT ILLNESS:   The patient returns today for followup of chronic abdominal pain, nausea, vomiting attributed to irritable bowel syndrome.  She had a consultation with Dr. Caryl Ada at [facility] on October 10, 2003 and her impression was that we were dealing with the possibility of irritable bowel syndrome vs malabsorption but her sprue profile was normal.  She felt that she had weight loss and chronic esophageal and epigastric pain due to irritable bowel syndrome.  Her lipase was mildly elevated but all the other tests were normal.  She advised her to stay on Aciphex 20 mg twice a day.  Her Helicobacter pylori was negative.    PHYSICAL EXAMINATION:   Blood pressure 130/86.  Pulse 80.  Weight 114 pounds which represents a two pound weight gain from last appointment four weeks ago.  She was in less distress than last time.  Skin was warm and dry.  She had some Raynaud's of her hands but good pulses.  Peripheral exam of the feet was normal.  Lungs were clear to auscultation.  COR with normal S1, normal S2.  Abdomen was soft with now minimal tenderness in the epigastrium.  Exam is improved from the last time.  Rectal exam not repeated.  Extremities:  There was trace edema bilaterally.  IMPRESSION:   1.   Irritable bowel syndrome, now somewhat improved.   2.   Mild elevation of serum lipase without clinical evidence of pancreatitis.  PLAN: 1.   Continue Aciphex 20 mg p.o. q.d. 2.   Continue small low fat meals. 3.   Return in six weeks.    Hedwig Morton. Juanda Chance, M.D.  [id number]  D:  10/27/03; T:  ; Job #[id number]

## 2010-10-08 NOTE — Letter (Signed)
Summary: Out of Work  [facility] at [facility]  [address] [place], [place] [address]   Phone: [phone]  Fax: [phone]    May 20, 2010   Employee:  ANEKA FAGERSTROM    To Whom It May Concern:   For Medical reasons, please excuse the above named employee from work for the following dates:  Start:   05/20/2010  End:   05/22/2010  If you need additional information, please feel free to contact our office.         Sincerely,    [name] MD

## 2010-10-08 NOTE — Progress Notes (Signed)
Summary: Nausea, vomiting, diarrhea  Phone Note Call from Patient Call back at Home Phone [phone]   Caller: Patient Call For: Dr. Dayton Martes  Summary of Call: Patient states that she started vomiting on Friday night.  Now she is having diarrhea, nausea, feels really flushed.  No body aches, she had chills on Friday night but does not now.  Feels hot and sweaty now.  Feels like she is dehydrated, has not been able to keep anything on her stomach, even water comes right back out of her.  I advised patient that we have no appt's but gave her the number to the [facility] office [phone], option 4 to see if they could schedule her to be seen today.   Initial call taken by: Linde Gillis CMA [facility]),  November 12, 2009 11:21 AM

## 2010-10-08 NOTE — Assessment & Plan Note (Signed)
Summary: EARS/CLE   Vital Signs:  Patient profile:   45 year old female Height:      62.75 inches Weight:      174 pounds BMI:     31.18 Temp:     98.8 degrees F oral Pulse rate:   120 / minute Pulse rhythm:   regular BP sitting:   120 / 78  (right arm) Cuff size:   regular  Vitals Entered By: Linde Gillis CMA [name]) (July 08, 2010 12:27 PM) CC: ear pain, headache   History of Present Illness: Pt here for worsening right ear pain, headache.  URI s/p Zpack, prednisone, Augmentin one month ago.   Overall, symptoms improved but over the weekend, right ear pain worsened, No discharge, decreased hearing. No fevers, chills or difficulty swallowing.  s/p rib fracture- pain improved.   Current Medications (verified): 1)  Sprintec 28 0.25-35 Mg-Mcg  Tabs (Norgestimate-Eth Estradiol) .... Take 1 Tablet By Mouth Once A Day 2)  Gabapentin 400 Mg Caps (Gabapentin) .... One Tablet By Mouth Two Times A Day At Bedtime 3)  Trazodone Hcl 100 Mg  Tabs (Trazodone Hcl) .... Take 1 Tablet At Bedtime 4)  Cymbalta 60 Mg  Cpep (Duloxetine Hcl) .... Take 1 Tab By Mouth At Bedtime 5)  Lisinopril 10 Mg Tabs (Lisinopril) .... Take One Tablet By Mouth Daily 6)  B-100  Tabs (Vitamins-Lipotropics) .... Otc As Directed. 7)  Hydrochlorothiazide 25 Mg  Tabs (Hydrochlorothiazide) .... Take 1 Tab By Mouth Every Morning 8)  Nitrostat 0.4 Mg Subl (Nitroglycerin) .Marland Kitchen.. 1 Tablet Under Tongue At Onset of Chest Pain; You May Repeat Every 5 Minutes For Up To 3 Doses. 9)  Diltiazem Hcl 30 Mg Tabs (Diltiazem Hcl) .Marland Kitchen.. 1 Tab By Mouth Every 6 Hours As Needed 10)  Prilosec 20 Mg Cpdr (Omeprazole) .... Take 1 Tablet By Mouth Once A Day 11)  Carafate 1 Gm/29ml  Susp (Sucralfate) .Marland Kitchen.. 10 Cc Twice Daily X 2 Weeks Than As Needed 12)  Imitrex 100 Mg Tabs (Sumatriptan Succinate) .Marland Kitchen.. 1 By Mouth At The Onset of Migraine. May Repeat Dose in One Hour If Headache Not Resolved 13)  Proair Hfa 108 (90 Base) Mcg/act  Aers (Albuterol  Sulfate) .... 2 Inh Q4h As Needed Shortness of Breath 14)  Tramadol Hcl 50 Mg  Tabs (Tramadol Hcl) .Marland Kitchen.. 1-2 Tab By Mouth Two Times A Day As Needed Pain 15)  Augmentin 875-125 Mg Tabs (Amoxicillin-Pot Clavulanate) .Marland Kitchen.. 1 By Mouth 2 Times Daily X 10 Days  Allergies: 1)  ! Codeine 2)  ! * Tape Adhesive 3)  ! Hydrocodone 4)  ! Vicodin  Review of Systems      See HPI General:  Denies fever. ENT:  Complains of earache, nasal congestion, and sore throat; denies decreased hearing, difficulty swallowing, and ear discharge. Resp:  Denies cough, sputum productive, and wheezing.  Physical Exam  General:  alert, well-developed, well-nourished, and cooperative to examination.    VSS, non toxic appearing. Ears:  Right ear- thick effusion, buldging L ear normal.   Nose:  nasal dischargemucosal pallor.   Mouth:  teeth and gums in good repair; mucous membranes moist, without lesions or ulcers. oropharynx clear without exudate,. Lungs:  Normal respiratory effort, chest expands symmetrically. Lungs are clear to auscultation, no crackles or wheezes. Heart:  Normal rate and regular rhythm. S1 and S2 normal without gallop, murmur, click, rub or other extra sounds. Extremities:  No clubbing, cyanosis, edema, or deformity noted with normal full range of motion of  all joints.   Psych:  normally interactive and good eye contact.      Impression & Recommendations:  Problem # 1:  OTITIS MEDIA, SEROUS, RIGHT (ICD-381.4) Assessment New Repeat course of Augmentin, Tramadol as needed pain. Follow up later this week if no improvement, will need ENT referral.  Complete Medication List: 1)  Sprintec 28 0.25-35 Mg-mcg Tabs (Norgestimate-eth estradiol) .... Take 1 tablet by mouth once a day 2)  Gabapentin 400 Mg Caps (Gabapentin) .... One tablet by mouth two times a day at bedtime 3)  Trazodone Hcl 100 Mg Tabs (Trazodone hcl) .... Take 1 tablet at bedtime 4)  Cymbalta 60 Mg Cpep (Duloxetine hcl) .... Take 1 tab  by mouth at bedtime 5)  Lisinopril 10 Mg Tabs (Lisinopril) .... Take one tablet by mouth daily 6)  B-100 Tabs (Vitamins-lipotropics) .... Otc as directed. 7)  Hydrochlorothiazide 25 Mg Tabs (Hydrochlorothiazide) .... Take 1 tab by mouth every morning 8)  Nitrostat 0.4 Mg Subl (Nitroglycerin) .Marland Kitchen.. 1 tablet under tongue at onset of chest pain; you may repeat every 5 minutes for up to 3 doses. 9)  Diltiazem Hcl 30 Mg Tabs (Diltiazem hcl) .Marland Kitchen.. 1 tab by mouth every 6 hours as needed 10)  Prilosec 20 Mg Cpdr (Omeprazole) .... Take 1 tablet by mouth once a day 11)  Carafate 1 Gm/70ml Susp (Sucralfate) .Marland Kitchen.. 10 cc twice daily x 2 weeks than as needed 12)  Imitrex 100 Mg Tabs (Sumatriptan succinate) .Marland Kitchen.. 1 by mouth at the onset of migraine. may repeat dose in one hour if headache not resolved 13)  Proair Hfa 108 (90 Base) Mcg/act Aers (Albuterol sulfate) .... 2 inh q4h as needed shortness of breath 14)  Tramadol Hcl 50 Mg Tabs (Tramadol hcl) .Marland Kitchen.. 1-2 tab by mouth two times a day as needed pain 15)  Augmentin 875-125 Mg Tabs (Amoxicillin-pot clavulanate) .Marland Kitchen.. 1 by mouth 2 times daily x 10 days Prescriptions: AUGMENTIN 875-125 MG TABS (AMOXICILLIN-POT CLAVULANATE) 1 by mouth 2 times daily x 10 days  #20 x 0   Entered and Authorized by:   [name] MD   Signed by:   [name] MD on 07/08/2010   Method used:   Electronically to        [facility]       [address]       [place], [place]  [address]       Ph: [phone]       Fax: [phone]   RxID:   [id number] IMITREX 100 MG TABS (SUMATRIPTAN SUCCINATE) 1 by mouth at the onset of migraine. May repeat dose in one hour if headache not resolved  #10 x 3   Entered and Authorized by:   [name] MD   Signed by:   [name] MD on 07/08/2010   Method used:   Electronically to        [facility]       [address]       [place],  [place]  [address]       Ph: [phone]       Fax: [phone]   RxID:   [id number] LISINOPRIL 10 MG TABS (LISINOPRIL) take one tablet by mouth daily  #60 x 6   Entered and Authorized by:   [name] MD   Signed by:   [name] MD on 07/08/2010   Method  used:   Electronically to        [facility]       [address]       [place], [place]  [address]       Ph: [phone]       Fax: [phone]   RxID:   [id number]    Orders Added: 1)  Est. Patient Level IV [[address]]    Current Allergies (reviewed today): ! CODEINE ! * TAPE ADHESIVE ! HYDROCODONE ! VICODIN

## 2010-10-08 NOTE — Assessment & Plan Note (Signed)
Summary: PER [facility] SCHED-DR ARON @ [facility]   Vital Signs:  Patient profile:   45 year old female Height:      62.75 inches (159.38 cm) Weight:      160.2 pounds (72.82 kg) O2 Sat:      97 % on Room air Temp:     98.7 degrees F (37.06 degrees C) oral Pulse rate:   89 / minute BP sitting:   100 / 78  (left arm) Cuff size:   regular  Vitals Entered By: Orlan Leavens (November 12, 2009 3:41 PM)  O2 Flow:  Room air CC: N/V and diarrhea since friday, Diarrhea Is Patient Diabetic? No Pain Assessment Patient in pain? no        Primary Care Provider:  [name], MD  CC:  N/V and diarrhea since friday and Diarrhea.  History of Present Illness:  Diarrhea      This is a 45 year old woman who presents with Diarrhea.  The symptoms began 4 days ago.  The severity is described as severe.  initially with vomitting at onset - this has since resolved but still with nausea.  The patient reports >6 stools per day, watery/unformed stools, fecal urgency, fecal soiling, and abrupt onset of symptoms, but denies blood in stool, mucus in stool, alternating diarrhea/constipation, bloating, and gassiness.  Associated symptoms include abdominal cramps, nausea, lightheadedness, and increased thirst.  The patient denies fever, abdominal pain, vomiting, joint pains, and mouth ulcers.  The symptoms are worse with any food.  The symptoms are better with fasting.  Patient's risk factors for diarrhea include sick contact, eating suspicious food, and eating shellfish just prior to onset of symptoms.    Current Medications (verified): 1)  Sprintec 28 0.25-35 Mg-Mcg  Tabs (Norgestimate-Eth Estradiol) .... Take 1 Tablet By Mouth Once A Day 2)  Gabapentin 100 Mg Caps (Gabapentin) .... 2 By Mouth Once Daily 3)  Trazodone Hcl 100 Mg  Tabs (Trazodone Hcl) .... Take 1 Tablet At Bedtime 4)  Cymbalta 60 Mg  Cpep (Duloxetine Hcl) .... Take 1 Tab By Mouth At Bedtime 5)  Lisinopril 5 Mg Tabs (Lisinopril) .Marland Kitchen.. 1  Daily Forbp 6)  B-100  Tabs (Vitamins-Lipotropics) .... Otc As Directed. 7)  Hydrochlorothiazide 25 Mg  Tabs (Hydrochlorothiazide) .... Take 1 Tab By Mouth Every Morning 8)  Nitrostat 0.4 Mg Subl (Nitroglycerin) .Marland Kitchen.. 1 Tablet Under Tongue At Onset of Chest Pain; You May Repeat Every 5 Minutes For Up To 3 Doses. 9)  Diltiazem Hcl 30 Mg Tabs (Diltiazem Hcl) .Marland Kitchen.. 1 Tab By Mouth Every 6 Hours As Needed  Allergies (verified): 1)  ! Codeine 2)  ! * Tape Adhesive 3)  ! Hydrocodone 4)  ! Vicodin  Past History:  Past Medical History: Allergic rhinitis Asthma GERD Calcaneal fracture -   MD rooster: ortho- [place] at [employer]  Review of Systems       The patient complains of anorexia.  The patient denies hoarseness, syncope, headaches, melena, and hematochezia.    Physical Exam  General:  alert, well-developed, well-nourished, and cooperative to examination.   mildly ill and tired Eyes:  vision grossly intact; pupils equal, round and reactive to light.  conjunctiva and lids normal.   no icterus Mouth:  teeth and gums in good repair; mucous membranes moist, without lesions or ulcers. oropharynx clear without exudate, no erythema.  Lungs:  normal respiratory effort, no intercostal retractions or use of accessory muscles; normal breath sounds bilaterally - no crackles and no  wheezes.    Heart:  slightly tachy rate, regular rhythm, no murmur, and no rub. BLE without edema.  normal cap refill in all 4 extremities    Abdomen:  soft, non-tender, normal bowel sounds, no distention; no masses and no appreciable hepatomegaly or splenomegaly.   Skin:  no rashes, vesicles, ulcers, or erythema. No nodules or irregularity to palpation. 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 Take 1 tablet by mouth once a day 2)  Gabapentin 100 Mg Caps (Gabapentin) .... 2 by mouth once daily 3)  Trazodone Hcl 100 Mg Tabs (Trazodone hcl) .... Take 1 tablet at bedtime 4)  Cymbalta 60 Mg Cpep (Duloxetine hcl) .... Take 1 tab by mouth at bedtime 5)  Lisinopril 5 Mg Tabs (Lisinopril) .Marland Kitchen.. 1 daily forbp 6)  B-100 Tabs (Vitamins-lipotropics) .... Otc as directed. 7)  Hydrochlorothiazide 25 Mg Tabs (Hydrochlorothiazide) .... Take 1 tab by mouth every morning 8)  Nitrostat 0.4 Mg Subl (Nitroglycerin) .Marland Kitchen.. 1 tablet under tongue at onset of chest pain; you may repeat every 5 minutes for up to 3 doses. 9)  Diltiazem Hcl 30 Mg Tabs (Diltiazem hcl) .Marland Kitchen.. 1 tab by mouth every 6 hours as needed 10)  Promethazine Hcl 25 Mg Tabs (Promethazine hcl) .Marland Kitchen.. 1 by mouth every 4 hours as needed for nausea or vomitting 11)  Lomotil 2.5-0.025 Mg Tabs (Diphenoxylate-atropine) .Marland Kitchen.. 1 by mouth every 6 hours as needed for diarrhea  Patient Instructions: 1)  it was good to see you today. 2)  medications for nausea and diarrhea symptoms as discussed - 3)  the main problem with gastroenteritis is dehydration. Drink clear liquids only for the next 24 hours, then slowly add other liquids and food as you  tolerate them. 4)  Get plenty of rest, drink lots of liquids, and use Tylenol or Ibuprofen for fever and comfort. Return in 7-10 days if you're not better:sooner if you're feeling worse. 5)  Recommended remaining out of work for next 24h - work note provided today and tomorrow 6)  if you develop worsening symptoms or fever, call us and we can reconsider antibiotics but it does not appear necessary to use any anitbiotic at this time  Prescriptions: LOMOTIL 2.5-0.025 MG TABS (DIPHENOXYLATE-ATROPINE) 1 by mouth every 6 hours as needed for diarrhea  #20 x 0   Entered and Authorized by:   Newt Lukes  MD   Signed by:   Newt Lukes MD on 11/12/2009   Method used:   Print then Give to Patient   RxID:   [id number] PROMETHAZINE HCL 25 MG TABS (PROMETHAZINE HCL) 1 by mouth every 4 hours as needed for nausea or vomitting  #20 x 1   Entered and Authorized by:   Newt Lukes MD   Signed by:   Newt Lukes MD on 11/12/2009   Method used:   Print then Give to Patient   RxID:   [id number]

## 2010-10-08 NOTE — Progress Notes (Signed)
Summary: chest pain.  ? GI origin  Phone Note Call from Patient   Summary of Call: pt called to state that she has had chest related pain today and has taken 2 NTG thus far.  Pt instructed to see ED for active CP.  Pt states that she has recently been there and work up was negative.  Based on last OV from Dr. Mariah Milling problems could be related to significant GI history.  Will refer pt. to Dr. Juanda Chance for w/u.  Initial call taken by: Charlena Cross, RN, BSN,  December 24, 2009 2:23 PM     Appended Document: chest pain.  ? GI origin appt June 9 @ 2:45 with Dr. Juanda Chance. Charlena Cross RN BSN

## 2010-10-08 NOTE — Assessment & Plan Note (Signed)
Summary: COUGHING, CONGESTION & EARS HURT / LFW   Vital Signs:  Patient profile:   45 year old female Height:      62.75 inches Weight:      162 pounds BMI:     29.03 Temp:     98.5 degrees F oral Pulse rate:   68 / minute Pulse rhythm:   regular BP sitting:   110 / 80  (left arm) Cuff size:   regular  Vitals Entered By: Linde Gillis CMA [name]) (May 20, 2010 12:26 PM) CC: cough, congestion, ear pain   History of Present Illness: 45 yo with cough, congestion, and right ear pain for over a week. Getting progressively worse. Last night had subjective fevers and chills. Cough is now productive, wheezing this morning. Sinus pressure, right greater than left. No CP or SOB.  Current Medications (verified): 1)  Sprintec 28 0.25-35 Mg-Mcg  Tabs (Norgestimate-Eth Estradiol) .... Take 1 Tablet By Mouth Once A Day 2)  Gabapentin 400 Mg Caps (Gabapentin) .... One Tablet By Mouth Two Times A Day At Bedtime 3)  Trazodone Hcl 100 Mg  Tabs (Trazodone Hcl) .... Take 1 Tablet At Bedtime 4)  Cymbalta 60 Mg  Cpep (Duloxetine Hcl) .... Take 1 Tab By Mouth At Bedtime 5)  Lisinopril 10 Mg Tabs (Lisinopril) .... Take One Tablet By Mouth Daily 6)  B-100  Tabs (Vitamins-Lipotropics) .... Otc As Directed. 7)  Hydrochlorothiazide 25 Mg  Tabs (Hydrochlorothiazide) .... Take 1 Tab By Mouth Every Morning 8)  Nitrostat 0.4 Mg Subl (Nitroglycerin) .Marland Kitchen.. 1 Tablet Under Tongue At Onset of Chest Pain; You May Repeat Every 5 Minutes For Up To 3 Doses. 9)  Diltiazem Hcl 30 Mg Tabs (Diltiazem Hcl) .Marland Kitchen.. 1 Tab By Mouth Every 6 Hours As Needed 10)  Prilosec 20 Mg Cpdr (Omeprazole) .... Take 1 Tablet By Mouth Once A Day 11)  Carafate 1 Gm/33ml  Susp (Sucralfate) .Marland Kitchen.. 10 Cc Twice Daily X 2 Weeks Than As Needed 12)  Imitrex 100 Mg Tabs (Sumatriptan Succinate) .Marland Kitchen.. 1 By Mouth At The Onset of Migraine. May Repeat Dose in One Hour If Headache Not Resolved 13)  Azithromycin 250 Mg  Tabs (Azithromycin) .... 2 By  Mouth  Today and Then 1 Daily For 4 Days 14)  Tessalon Perles 100 Mg  Caps (Benzonatate) .Marland Kitchen.. 1 Tab By Mouth Three Times A Day As Needed Cough 15)  Proair Hfa 108 (90 Base) Mcg/act  Aers (Albuterol Sulfate) .... 2 Inh Q4h As Needed Shortness of Breath  Allergies: 1)  ! Codeine 2)  ! * Tape Adhesive 3)  ! Hydrocodone 4)  ! Vicodin  Past History:  Past Medical History: Last updated: 01/22/2010 Allergic rhinitis Asthma GERD Calcaneal fracture -   MD roster: Jenna Luo at [facility]  Past Surgical History: Last updated: 01/22/2010 tonsillectomy age 47 bunionectomy R) 13 years ago (11/04/05) 1st visit cholecystectomy 20 yrs ago back surg 10/06-- laminectomy L 4-5 --Ray knee surg x 2 Murphy/ Fannie Knee compressed pronator R)-- Sypher multiple lithotripsies  Family History: Last updated: 01/25/2010 Family History of Heart Disease: Father Family History of Colon Cancer: Paternal Grandfather, Maternal Grandfather Family History of Colon Polyps: Father Family History of Uterine Cancer: Grandmother Family History of Breast Cancer: Grandmother Family History of Ovarian Cancer: Grandmother Family History of Diabetes: Mother  Social History: Last updated: 01/25/2010 Marital Status: Married Children: 3 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 ENT:  Complains of earache, nasal congestion, postnasal drainage, and sinus pressure; denies ear discharge and sore throat. CV:  Denies chest pain or discomfort. Resp:  Complains of cough, sputum productive, and wheezing; denies shortness of breath.  Physical Exam  General:  alert, well-developed, well-nourished, and  cooperative to examination.    VSS, non toxic appearing. Ears:  Right TM- thick fluid behind it, otherwise normal. Left TM normal Nose:  nasal dischargemucosal pallor.   Mouth:  teeth and gums in good repair; mucous membranes moist, without lesions or ulcers. oropharynx clear without exudate,. Lungs:  normal respiratory effort, no intercostal retractions or use of accessory muscles; scattered exp wheezes, right >left, no crackles. Heart:  Normal rate and regular rhythm. S1 and S2 normal without gallop, murmur, click, rub or other extra sounds. Skin:  no rashes, vesicles, ulcers, or erythema. No nodules or irregularity to palpation. no tenting Cervical Nodes:  No lymphadenopathy noted Psych:  normally interactive and good eye contact.     Impression & Recommendations:  Problem # 1:  URI (ICD-465.9) Assessment New Given duration and progression of symptoms, will treat with Zpack for sinusitis/bronchitis. Tessalon perles as needed cough. Albuterol as needed wheezing. Follow up if no improvement in 7-10 days. Her updated medication list for this problem includes:    Tessalon Perles 100 Mg Caps (Benzonatate) .Marland Kitchen... 1 tab by mouth three times a day as needed cough  Complete Medication List: 1)  Sprintec 28 0.25-35 Mg-mcg Tabs (Norgestimate-eth estradiol) .... Take 1 tablet by mouth once a day 2)  Gabapentin 400 Mg Caps (Gabapentin) .... One tablet by mouth two times a day at bedtime 3)  Trazodone Hcl 100 Mg Tabs (Trazodone hcl) .... Take 1 tablet at bedtime 4)  Cymbalta 60 Mg Cpep (Duloxetine hcl) .... Take 1 tab by mouth at bedtime 5)  Lisinopril 10 Mg Tabs (Lisinopril) .... Take one tablet by mouth daily 6)  B-100 Tabs (Vitamins-lipotropics) .... Otc as directed. 7)  Hydrochlorothiazide 25 Mg Tabs (Hydrochlorothiazide) .... Take 1 tab by mouth every morning 8)  Nitrostat 0.4 Mg Subl (Nitroglycerin) .Marland Kitchen.. 1 tablet under tongue at onset of chest pain; you may repeat every 5 minutes for up to  3 doses. 9)  Diltiazem Hcl 30 Mg Tabs (Diltiazem hcl) .Marland Kitchen.. 1 tab by mouth every 6 hours as needed 10)  Prilosec 20 Mg Cpdr (Omeprazole) .... Take 1 tablet by mouth once a day 11)  Carafate 1 Gm/10ml Susp (Sucralfate) .Marland Kitchen.. 10 cc twice daily x 2 weeks than as needed 12)  Imitrex 100 Mg Tabs (Sumatriptan succinate) .Marland Kitchen.. 1 by mouth at the onset of migraine. may repeat dose in one hour if headache not resolved 13)  Azithromycin 250 Mg Tabs (Azithromycin) .... 2 by  mouth today and then 1 daily for 4 days 14)  Tessalon Perles 100 Mg Caps (Benzonatate) .Marland Kitchen.. 1 tab by mouth three times a day as needed cough 15)  Proair Hfa 108 (90 Base) Mcg/act Aers (Albuterol sulfate) .... 2 inh q4h as needed shortness of breath Prescriptions: PROAIR HFA 108 (90 BASE) MCG/ACT  AERS (ALBUTEROL SULFATE) 2 inh q4h as needed shortness of breath  #1 x 0   Entered and Authorized by:   [name] MD   Signed by:  [name] MD on 05/20/2010   Method used:   Electronically to        [facility]       [address]       [place], [place]  [address]       Ph: [phone]       Fax: [phone]   RxID:   [id number] TESSALON PERLES 100 MG  CAPS (BENZONATATE) 1 tab by mouth three times a day as needed cough  #30 x 0   Entered and Authorized by:   [name] MD   Signed by:   [name] MD on 05/20/2010   Method used:   Electronically to        [facility]       [address]       [place], [place]  [address]       Ph: [phone]       Fax: [phone]   RxID:   [id number] AZITHROMYCIN 250 MG  TABS (AZITHROMYCIN) 2 by  mouth today and then 1 daily for 4 days  #6 x 0   Entered and Authorized by:   [name] MD   Signed by:   [name] MD on 05/20/2010   Method used:   Electronically to        [facility]       [address]       [place], [place]  [address]       Ph: [phone]       Fax: [phone]   RxID:   [id number]   Current Allergies (reviewed today): ! CODEINE ! * TAPE ADHESIVE ! HYDROCODONE ! VICODIN

## 2010-10-08 NOTE — Progress Notes (Signed)
Summary: wants something stronger for pain  Phone Note Call from Patient Call back at Work Phone [phone] Call back at [phone]   Caller: Patient Call For: [name] MD Summary of Call: Pt called and stated that she is still having a lot of pain, she knows that she has to give this time but the pain is bad at night and the tramadol is not helping at all.  She had vomiting last night from the pain.  She is asking if there is something else she can take.  [facility]. Initial call taken by: Lowella Petties CMA,  June 12, 2010 3:15 PM  Follow-up for Phone Call        She told me that she could not tolerate narcotics so there truly is nothing stronger that is not a narcotic.  Has she tried anything in past that she could tolerate? [name] MD  June 12, 2010 4:29 PM    Additional Follow-up for Phone Call Additional follow up Details #1::        She says she has had a demerol, tylenol combination that she can tolerate, she has this and torodol at home.  Should she try one of these?              Lowella Petties CMA  June 12, 2010 4:40 PM  Yes she can try it. [name] MD  June 12, 2010   Advised pt. Additional Follow-up by: Lowella Petties CMA,  June 13, 2010 8:12 AM

## 2010-10-08 NOTE — Progress Notes (Signed)
Summary: having chest pain   Phone Note Call from Patient Call back at [phone]   Caller: Patient Summary of Call: Patient says that she has been having chest pain for the past few days, she says that it will ease up and then it will get bad again. She says that if feels like someone is crushing her. Had shortness of breath on Saturday and Sunday, but today is just having the chest pain. II told patient that she should go to emergency room, but she says that she does not want to go to the emergency room. She want to know if she can just get a referral to go to cardiologist.  Initial call taken by: Melody Comas,  November 05, 2009 8:49 AM  Follow-up for Phone Call        Needs to be seen in the ER...could saveher damge to her heart that  could be irreversible. Pls go to ER. Follow-up by: Shaune Leeks MD,  November 05, 2009 9:04 AM  Additional Follow-up for Phone Call Additional follow up Details #1::        Advised patient to go to ER, she said that she would.  Additional Follow-up by: Melody Comas,  November 05, 2009 9:15 AM

## 2010-10-09 ENCOUNTER — Telehealth: Payer: Self-pay | Admitting: Family Medicine

## 2010-10-11 ENCOUNTER — Encounter: Payer: Self-pay | Admitting: Family Medicine

## 2010-10-11 ENCOUNTER — Ambulatory Visit (INDEPENDENT_AMBULATORY_CARE_PROVIDER_SITE_OTHER): Payer: BC Managed Care – PPO | Admitting: Family Medicine

## 2010-10-11 DIAGNOSIS — H6590 Unspecified nonsuppurative otitis media, unspecified ear: Secondary | ICD-10-CM

## 2010-10-11 NOTE — Progress Notes (Signed)
Summary: PHI  PHI   Imported By: Harlon Flor 11/12/2009 15:21:22  _____________________________________________________________________  External Attachment:    Type:   Image     Comment:   External Document

## 2010-10-16 NOTE — Assessment & Plan Note (Signed)
Summary: EARS/CLE   Vital Signs:  Patient profile:   45 year old female Height:      62.75 inches Weight:      173 pounds BMI:     31.00 Temp:     98.6 degrees F oral Pulse rate:   92 / minute Pulse rhythm:   regular BP sitting:   122 / 88  (left arm) Cuff size:   regular  Vitals Entered By: Linde Gillis CMA [name]) (October 07, 2010 10:52 AM) CC: ear pain, cough   History of Present Illness: Pt here for worsening right ear pain, headache, non productive cough. Symptoms started almost 2 weeks ago but getting worse since Saturday.  Had body aches last night.   No wheezing, CP or SOB (unless coughing). Has a proair inhaler at home but did not feel like she needed it yet. No fevers.     Current Medications (verified): 1)  Sprintec 28 0.25-35 Mg-Mcg  Tabs (Norgestimate-Eth Estradiol) .... Take 1 Tablet By Mouth Once A Day 2)  Gabapentin 400 Mg Caps (Gabapentin) .... One Tablet By Mouth Two Times A Day At Bedtime 3)  Trazodone Hcl 100 Mg  Tabs (Trazodone Hcl) .... Take 1 Tablet At Bedtime 4)  Cymbalta 60 Mg  Cpep (Duloxetine Hcl) .... Take 1 Tab By Mouth At Bedtime 5)  Lisinopril 10 Mg Tabs (Lisinopril) .... Take One Tablet By Mouth Daily 6)  B-100  Tabs (Vitamins-Lipotropics) .... Otc As Directed. 7)  Hydrochlorothiazide 25 Mg  Tabs (Hydrochlorothiazide) .... Take 1 Tab By Mouth Every Morning 8)  Nitrostat 0.4 Mg Subl (Nitroglycerin) .Marland Kitchen.. 1 Tablet Under Tongue At Onset of Chest Pain; You May Repeat Every 5 Minutes For Up To 3 Doses. 9)  Diltiazem Hcl 30 Mg Tabs (Diltiazem Hcl) .Marland Kitchen.. 1 Tab By Mouth Every 6 Hours As Needed 10)  Prilosec 20 Mg Cpdr (Omeprazole) .... Take 1 Tablet By Mouth Once A Day 11)  Carafate 1 Gm/30ml  Susp (Sucralfate) .Marland Kitchen.. 10 Cc Twice Daily X 2 Weeks Than As Needed 12)  Imitrex 100 Mg Tabs (Sumatriptan Succinate) .Marland Kitchen.. 1 By Mouth At The Onset of Migraine. May Repeat Dose in One Hour If Headache Not Resolved 13)  Proair Hfa 108 (90 Base) Mcg/act  Aers  (Albuterol Sulfate) .... 2 Inh Q4h As Needed Shortness of Breath 14)  Tramadol Hcl 50 Mg  Tabs (Tramadol Hcl) .Marland Kitchen.. 1-2 Tab By Mouth Two Times A Day As Needed Pain 15)  Augmentin 875-125 Mg Tabs (Amoxicillin-Pot Clavulanate) .Marland Kitchen.. 1 By Mouth 2 Times Daily X 10 Days 16)  Tussin Pe 30-100 Mg/76ml Syrp (Pseudoephedrine-Guaifenesin) .... 5 Ml By Mouth Two Times A Day As Needed Cough.  Allergies: 1)  ! Codeine 2)  ! * Tape Adhesive 3)  ! Hydrocodone 4)  ! Vicodin  Past History:  Past Medical History: Last updated: 01/22/2010 Allergic rhinitis Asthma GERD Calcaneal fracture -   MD roster: Jenna Luo at [facility]  Past Surgical History: Last updated: 01/22/2010 tonsillectomy age 43 bunionectomy R) 13 years ago (11/04/05) 1st visit cholecystectomy 20 yrs ago back surg 10/06-- laminectomy L 4-5 --Ray knee surg x 2 Murphy/ Fannie Knee compressed pronator R)-- Sypher multiple lithotripsies  Family History: Last updated: 01/25/2010 Family History of Heart Disease: Father Family History of Colon Cancer: Paternal Grandfather, Maternal Grandfather Family History of Colon Polyps: Father Family History of Uterine Cancer: Grandmother Family History of Breast Cancer: Grandmother Family History of Ovarian Cancer: Grandmother Family History of Diabetes: Mother  Social History: Last updated: 01/25/2010  Marital Status: Married Children: 3 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 ENT:  Complains of earache, nasal congestion, postnasal drainage, sinus pressure, and sore throat; denies ear discharge. Resp:  Complains of cough; denies shortness of breath, sputum  productive, and wheezing.  Physical Exam  General:  alert, well-developed, well-nourished, and cooperative to examination.    VSS, non toxic appearing. Head:  Normocephalic and atraumatic without obvious abnormalities. No apparent alopecia or balding. Ears:  Right ear- thick effusion, buldging L ear normal.   Nose:  nasal dischargemucosal pallor.   Mouth:  teeth and gums in good repair; mucous membranes moist, without lesions or ulcers. oropharynx clear without exudate,. Lungs:  Normal respiratory effort, chest expands symmetrically. Lungs are clear to auscultation, no crackles or wheezes. Heart:  Normal rate and regular rhythm. S1 and S2 normal without gallop, murmur, click, rub or other extra sounds. Extremities:  No clubbing, cyanosis, edema, or deformity noted with normal full range of motion of all joints.   Psych:  normally interactive and good eye contact.     Impression & Recommendations:  Problem # 1:  OTITIS MEDIA, SEROUS, RIGHT (ICD-381.4) Assessment New Augmentin x10 days. Supportive care as per pt instructions.  Complete Medication List: 1)  Sprintec 28 0.25-35 Mg-mcg Tabs (Norgestimate-eth estradiol) .... Take 1 tablet by mouth once a day 2)  Gabapentin 400 Mg Caps (Gabapentin) .... One tablet by mouth two times a day at bedtime 3)  Trazodone Hcl 100 Mg Tabs (Trazodone hcl) .... Take 1 tablet at bedtime 4)  Cymbalta 60 Mg Cpep (Duloxetine hcl) .... Take 1 tab by mouth at bedtime 5)  Lisinopril 10 Mg Tabs (Lisinopril) .... Take one tablet by mouth daily 6)  B-100 Tabs (Vitamins-lipotropics) .... Otc as directed. 7)  Hydrochlorothiazide 25 Mg Tabs (Hydrochlorothiazide) .... Take 1 tab by mouth every morning 8)  Nitrostat 0.4 Mg Subl (Nitroglycerin) .Marland Kitchen.. 1 tablet under tongue at onset of chest pain; you may repeat every 5 minutes for up to 3 doses. 9)  Diltiazem Hcl 30 Mg Tabs (Diltiazem hcl) .Marland Kitchen.. 1 tab by mouth every 6 hours as needed 10)  Prilosec 20 Mg Cpdr  (Omeprazole) .... Take 1 tablet by mouth once a day 11)  Carafate 1 Gm/53ml Susp (Sucralfate) .Marland Kitchen.. 10 cc twice daily x 2 weeks than as needed 12)  Imitrex 100 Mg Tabs (Sumatriptan succinate) .Marland Kitchen.. 1 by mouth at the onset of migraine. may repeat dose in one hour if headache not resolved 13)  Proair Hfa 108 (90 Base) Mcg/act Aers (Albuterol sulfate) .... 2 inh q4h as needed shortness of breath 14)  Tramadol Hcl 50 Mg Tabs (Tramadol hcl) .Marland Kitchen.. 1-2 tab by mouth two times a day as needed pain 15)  Augmentin 875-125 Mg Tabs (Amoxicillin-pot clavulanate) .Marland Kitchen.. 1 by mouth 2 times daily x 10 days 16)  Tussin Pe 30-100 Mg/41ml Syrp (Pseudoephedrine-guaifenesin) .... 5 ml by mouth two times a day as needed cough.  Patient Instructions: 1)  Take antibiotic as directed.  Drink lots of fluids.  Treat sympotmatically with Mucinex, nasal saline irrigation, and Tylenol/Ibuprofen. Also try claritin D or zyrtec D over the counter- two times a day  as needed ( have to sign for them at pharmacy). You can use warm compresses.  Cough suppressant at night. Call if not improving as expected in 5-7 days.  Prescriptions: AUGMENTIN 875-125 MG TABS (AMOXICILLIN-POT CLAVULANATE) 1 by mouth 2 times daily x 10 days  #20 x 0   Entered and Authorized by:   [name] MD   Signed by:   [name] MD on 10/07/2010   Method used:   [facility]        [facility]       [address]       [place], [place]  [address]       Ph: [phone]       Fax: [phone]   RxID:   [id number] TUSSIN PE 30-100 MG/5ML SYRP (PSEUDOEPHEDRINE-GUAIFENESIN) 5 ml by mouth two times a day as needed cough.  #5 ml x 0   Entered and Authorized by:   [name] MD   Signed by:   [name] MD on 10/07/2010   Method used:   Print then Give to Patient   RxID:   [id number] AUGMENTIN 875-125 MG TABS (AMOXICILLIN-POT CLAVULANATE) 1 by mouth 2 times daily x 10 days  #20 x 0   Entered and  Authorized by:   [name] MD   Signed by:   [name] MD on 10/07/2010   Method used:   Print then Give to Patient   RxID:   [id number]    Orders Added: 1)  Est. Patient Level III [56433]    Current Allergies (reviewed today): ! CODEINE ! * TAPE ADHESIVE ! HYDROCODONE ! VICODIN

## 2010-10-16 NOTE — Progress Notes (Signed)
Summary: needs something for ear pain  Phone Note Call from Patient Call back at [phone]   Caller: Patient Call For: [name] MD Summary of Call: Pt was seen on monday for ear pain.  She is taking 3 advil about every 5-6 hours and it's not helping the pain.  She is asking what else she can try.  [facility]. Initial call taken by: Lowella Petties CMA, AAMA,  October 09, 2010 9:49 AM  Follow-up for Phone Call        She is allergic to narcotics.  We can try tramadol if she would like. [name] MD  October 09, 2010 9:50 AM  Advised pt, she has some tramadol at home and will try that. Follow-up by: Lowella Petties CMA, AAMA,  October 09, 2010 11:44 AM

## 2010-10-16 NOTE — Assessment & Plan Note (Signed)
Summary: RIGHT EAR PAIN   Vital Signs:  Patient profile:   45 year old female Height:      62.75 inches Weight:      173 pounds BMI:     31.00 Temp:     98.7 degrees F oral Pulse rate:   84 / minute Pulse rhythm:   regular BP sitting:   104 / 80  (left arm) Cuff size:   regular  Vitals Entered By: Delilah Shan CMA  Dull) (October 11, 2010 2:19 PM) CC: Ear pain, throat hurts,"just don't feel good."   History of Present Illness: Prev seen and note reviewed.  On augmentin.  R thick effusion noted prev.  No fever, some chills.  Some nauea, likely from the ear pain.  Pain in ear with chewing.  L ear not painful.  cough improved.  Voice is improved.    Allergies: 1)  ! Codeine 2)  ! * Tape Adhesive 3)  ! Hydrocodone 4)  ! Vicodin  Review of Systems       See HPI.  Otherwise negative.    Physical Exam  General:  no apparent distress normocephalic atraumatic mucous membranes moist R tm w/o erythema but som noted. L tm wnl no pinna pain and mastoid not tender to palpation x2 nasal exam: stuffy R>L with erythema op wnl neck supple regular rate and rhythm clear to auscultation bilaterally    Impression & Recommendations:  Problem # 1:  OTITIS MEDIA, SEROUS, RIGHT (ICD-381.4) Improved but still likely with component of ETD.  Will start flonase and if not improved, she may need ENT eval.  call back as needed.  She feels better with local heat.  I would continue local head and use the tramadol as needed. Nontoxic, she agrees.   Orders: Prescription Created Electronically [phone])  Complete Medication List: 1)  Sprintec 28 0.25-35 Mg-mcg Tabs (Norgestimate-eth estradiol) .... Take 1 tablet by mouth once a day 2)  Gabapentin 400 Mg Caps (Gabapentin) .... One tablet by mouth two times a day at bedtime 3)  Trazodone Hcl 100 Mg Tabs (Trazodone hcl) .... Take 1 tablet at bedtime 4)  Cymbalta 60 Mg Cpep (Duloxetine hcl) .... Take 1 tab by mouth at bedtime 5)  Lisinopril 10  Mg Tabs (Lisinopril) .... Take one tablet by mouth daily 6)  B-100 Tabs (Vitamins-lipotropics) .... Otc as directed. 7)  Hydrochlorothiazide 25 Mg Tabs (Hydrochlorothiazide) .... Take 1 tab by mouth every morning 8)  Nitrostat 0.4 Mg Subl (Nitroglycerin) .Marland Kitchen.. 1 tablet under tongue at onset of chest pain; you may repeat every 5 minutes for up to 3 doses. 9)  Imitrex 100 Mg Tabs (Sumatriptan succinate) .Marland Kitchen.. 1 by mouth at the onset of migraine. may repeat dose in one hour if headache not resolved 10)  Proair Hfa 108 (90 Base) Mcg/act Aers (Albuterol sulfate) .... 2 inh q4h as needed shortness of breath 11)  Tramadol Hcl 50 Mg Tabs (Tramadol hcl) .Marland Kitchen.. 1-2 tab by mouth two times a day as needed pain 12)  Augmentin 875-125 Mg Tabs (Amoxicillin-pot clavulanate) .Marland Kitchen.. 1 by mouth 2 times daily x 10 days 13)  Tussin Pe 30-100 Mg/24ml Syrp (Pseudoephedrine-guaifenesin) .... 5 ml by mouth two times a day as needed cough. 14)  Flonase 50 Mcg/act Susp (Fluticasone propionate) .... 2 sprays per nostril per day.  Patient Instructions: 1)  Use the tramadol as needed.  Start the flonase and use 2 sprays in each nostril once daily.  Finish the antibiotics and let us know  if you aren't improved.  We may need to set you up with ENT.   Prescriptions: FLONASE 50 MCG/ACT SUSP (FLUTICASONE PROPIONATE) 2 sprays per nostril per day.  #1 x 1   Entered and Authorized by:   Crawford Givens MD   Signed by:   Crawford Givens MD on 10/11/2010   Method used:   Electronically to        [facility]       [address]       [place], [place]  [address]       Ph: [phone]       Fax: [phone]   RxID:   [id number]    Orders Added: 1)  Prescription Created Electronically [G8553] 2)  Est. Patient Level III [84132]    Current Allergies (reviewed today): ! CODEINE ! * TAPE ADHESIVE ! HYDROCODONE ! VICODIN

## 2010-10-16 NOTE — Letter (Signed)
Summary: Out of Work  [employer] at [facility]  [address] [place], [place] [address]   Phone: [phone]  Fax: [phone]    October 11, 2010   Employee:  RAENA PAU    To Whom It May Concern:   For Medical reasons, please excuse the above named employee from work for the following dates:  Start:   10/07/10  End:   return when ear pain resolved out until 10/13/10 at least.   If you need additional information, please feel free to contact our office.         Sincerely,    Crawford Givens MD

## 2010-10-16 NOTE — Letter (Signed)
Summary: Out of Work  [employer] at [facility]  [address] [place], [place] [address]   Phone: [phone]  Fax: [phone]    October 07, 2010   Employee:  SAI MOURA    To Whom It May Concern:   Ms. Gali was seen in our office today.  Please excuse her.    If you need additional information, please feel free to contact our office.         Sincerely,    [name] MD

## 2010-10-30 ENCOUNTER — Ambulatory Visit (INDEPENDENT_AMBULATORY_CARE_PROVIDER_SITE_OTHER): Payer: BC Managed Care – PPO | Admitting: Family Medicine

## 2010-10-30 ENCOUNTER — Encounter: Payer: Self-pay | Admitting: Family Medicine

## 2010-10-30 DIAGNOSIS — H6590 Unspecified nonsuppurative otitis media, unspecified ear: Secondary | ICD-10-CM

## 2010-11-05 NOTE — Assessment & Plan Note (Signed)
Summary: ear infection/alc   Vital Signs:  Patient profile:   45 year old female Height:      62.75 inches Weight:      178.25 pounds BMI:     31.94 Temp:     98.7 degrees F oral Pulse rate:   83 / minute Pulse rhythm:   regular BP sitting:   112 / 80  (left arm) Cuff size:   regular  Vitals Entered By: Linde Gillis CMA [name]) (October 30, 2010 11:59 AM) CC: right ear pain   History of Present Illness: 45 yo here for persistent right ear pain/pressure.  s/p Augmentin 1/30 for similar symptoms.  returned on 2/3, saw Dr. Para March, restarted flonase. Has not helped.  No fever, some chills. Pain in ear with chewing.  L ear not painful.  mild cough (improved). voice improved.  Current Medications (verified): 1)  Sprintec 28 0.25-35 Mg-Mcg  Tabs (Norgestimate-Eth Estradiol) .... Take 1 Tablet By Mouth Once A Day 2)  Gabapentin 400 Mg Caps (Gabapentin) .... One Tablet By Mouth Two Times A Day At Bedtime 3)  Trazodone Hcl 100 Mg  Tabs (Trazodone Hcl) .... Take 1 Tablet At Bedtime 4)  Cymbalta 60 Mg  Cpep (Duloxetine Hcl) .... Take 1 Tab By Mouth At Bedtime 5)  Lisinopril 10 Mg Tabs (Lisinopril) .... Take One Tablet By Mouth Daily 6)  B-100  Tabs (Vitamins-Lipotropics) .... Otc As Directed. 7)  Hydrochlorothiazide 25 Mg  Tabs (Hydrochlorothiazide) .... Take 1 Tab By Mouth Every Morning 8)  Nitrostat 0.4 Mg Subl (Nitroglycerin) .Marland Kitchen.. 1 Tablet Under Tongue At Onset of Chest Pain; You May Repeat Every 5 Minutes For Up To 3 Doses. 9)  Imitrex 100 Mg Tabs (Sumatriptan Succinate) .Marland Kitchen.. 1 By Mouth At The Onset of Migraine. May Repeat Dose in One Hour If Headache Not Resolved 10)  Proair Hfa 108 (90 Base) Mcg/act  Aers (Albuterol Sulfate) .... 2 Inh Q4h As Needed Shortness of Breath 11)  Azithromycin 250 Mg  Tabs (Azithromycin) .... 2 By  Mouth Today and Then 1 Daily For 4 Days 12)  Fluticasone Propionate 50 Mcg/act  Susp (Fluticasone Propionate) .... 2 Sprays Each Nostril Once  Daily  Allergies: 1)  ! Codeine 2)  ! * Tape Adhesive 3)  ! Hydrocodone 4)  ! Vicodin  Past History:  Past Medical History: Last updated: 01/22/2010 Allergic rhinitis Asthma GERD Calcaneal fracture -   MD roster: Jenna Luo at [facility]  Past Surgical History: Last updated: 01/22/2010 tonsillectomy age 45 bunionectomy R) 13 years ago (11/04/05) 1st visit cholecystectomy 20 yrs ago back surg 10/06-- laminectomy L 4-5 --Ray knee surg x 2 Murphy/ Fannie Knee compressed pronator R)-- Sypher multiple lithotripsies  Family History: Last updated: 01/25/2010 Family History of Heart Disease: Father Family History of Colon Cancer: Paternal Grandfather, Maternal Grandfather Family History of Colon Polyps: Father Family History of Uterine Cancer: Grandmother Family History of Breast Cancer: Grandmother Family History of Ovarian Cancer: Grandmother Family History of Diabetes: Mother  Social History: Last updated: 01/25/2010 Marital Status: Married Children: 3 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 ENT:  Complains of earache; denies ear discharge. Resp:  Complains of cough; denies  shortness of breath, sputum productive, and wheezing.  Physical Exam  General:  no apparent distress, non toxic normocephalic atraumatic mucous membranes moist R tm w/o erythema but som noted. L tm wnl nasal exam: stuffy R>L with erythema op wnl neck supple regular rate and rhythm clear to auscultation bilaterally    Impression & Recommendations:  Problem # 1:  OTITIS MEDIA, SEROUS, RIGHT (ICD-381.4) Assessment Unchanged At this point, will refer to ENT  for futher evaluation and tx. Zpack, continue flonase. Orders: ENT Referral (ENT)  Complete Medication List: 1)  Sprintec 28 0.25-35 Mg-mcg Tabs (Norgestimate-eth estradiol) .... Take 1 tablet by mouth once a day 2)  Gabapentin 400 Mg Caps (Gabapentin) .... One tablet by mouth two times a day at bedtime 3)  Trazodone Hcl 100 Mg Tabs (Trazodone hcl) .... Take 1 tablet at bedtime 4)  Cymbalta 60 Mg Cpep (Duloxetine hcl) .... Take 1 tab by mouth at bedtime 5)  Lisinopril 10 Mg Tabs (Lisinopril) .... Take one tablet by mouth daily 6)  B-100 Tabs (Vitamins-lipotropics) .... Otc as directed. 7)  Hydrochlorothiazide 25 Mg Tabs (Hydrochlorothiazide) .... Take 1 tab by mouth every morning 8)  Nitrostat 0.4 Mg Subl (Nitroglycerin) .Marland Kitchen.. 1 tablet under tongue at onset of chest pain; you may repeat every 5 minutes for up to 3 doses. 9)  Imitrex 100 Mg Tabs (Sumatriptan succinate) .Marland Kitchen.. 1 by mouth at the onset of migraine. may repeat dose in one hour if headache not resolved 10)  Proair Hfa 108 (90 Base) Mcg/act Aers (Albuterol sulfate) .... 2 inh q4h as needed shortness of breath 11)  Azithromycin 250 Mg Tabs (Azithromycin) .... 2 by  mouth today and then 1 daily for 4 days 12)  Fluticasone Propionate 50 Mcg/act Susp (Fluticasone propionate) .... 2 sprays each nostril once daily  Patient Instructions: 1)  please stop by to see Shirlee Limerick on your way out. Prescriptions: FLUTICASONE PROPIONATE 50 MCG/ACT  SUSP (FLUTICASONE PROPIONATE) 2 sprays each nostril once daily  #1 vial x 3   Entered and Authorized by:   [name] MD   Signed by:   [name] MD on 10/30/2010   Method used:   [facility]ron[facility] to        [facility]       [address]       [place], [place]  [address]       Ph: [phone]       Fax: [phone]   RxID:   [id number] AZITHROMYCIN 250 MG  TABS (AZITHROMYCIN) 2 by  mouth today and then 1 daily for 4 days  #6 x 0   Entered and  Authorized by:   [name] MD   Signed by:   [name] MD on 10/30/2010   Method used:   Electronically to        [facility]       [address]       [place], [place]  [address]       Ph: [phone]       Fax: [phone]   RxID:   [id number]    Orders Added: 1)  ENT Referral [ENT] 2)  Est. Patient Level IV [60109]    Current Allergies (reviewed today): ! CODEINE ! * TAPE ADHESIVE ! HYDROCODONE ! VICODIN

## 2010-11-07 ENCOUNTER — Encounter: Payer: Self-pay | Admitting: Family Medicine

## 2010-11-07 ENCOUNTER — Ambulatory Visit: Payer: BC Managed Care – PPO | Admitting: Family Medicine

## 2010-11-07 ENCOUNTER — Emergency Department (HOSPITAL_COMMUNITY): Payer: BC Managed Care – PPO

## 2010-11-07 ENCOUNTER — Emergency Department (HOSPITAL_COMMUNITY)
Admission: EM | Admit: 2010-11-07 | Discharge: 2010-11-07 | Disposition: A | Discharge status: Home / Self Care | Payer: BC Managed Care – PPO | Attending: Emergency Medicine | Admitting: Emergency Medicine

## 2010-11-07 DIAGNOSIS — R0602 Shortness of breath: Secondary | ICD-10-CM | POA: Insufficient documentation

## 2010-11-07 DIAGNOSIS — M129 Arthropathy, unspecified: Secondary | ICD-10-CM | POA: Insufficient documentation

## 2010-11-07 DIAGNOSIS — IMO0001 Reserved for inherently not codable concepts without codable children: Secondary | ICD-10-CM | POA: Insufficient documentation

## 2010-11-07 DIAGNOSIS — J45902 Unspecified asthma with status asthmaticus: Secondary | ICD-10-CM | POA: Insufficient documentation

## 2010-11-07 DIAGNOSIS — J45909 Unspecified asthma, uncomplicated: Secondary | ICD-10-CM

## 2010-11-07 DIAGNOSIS — R064 Hyperventilation: Secondary | ICD-10-CM | POA: Insufficient documentation

## 2010-11-07 DIAGNOSIS — J45901 Unspecified asthma with (acute) exacerbation: Secondary | ICD-10-CM | POA: Insufficient documentation

## 2010-11-07 DIAGNOSIS — I10 Essential (primary) hypertension: Secondary | ICD-10-CM | POA: Insufficient documentation

## 2010-11-11 ENCOUNTER — Encounter: Payer: Self-pay | Admitting: Family Medicine

## 2010-11-11 ENCOUNTER — Ambulatory Visit (INDEPENDENT_AMBULATORY_CARE_PROVIDER_SITE_OTHER): Payer: BC Managed Care – PPO | Admitting: Family Medicine

## 2010-11-11 DIAGNOSIS — I10 Essential (primary) hypertension: Secondary | ICD-10-CM

## 2010-11-11 DIAGNOSIS — J45902 Unspecified asthma with status asthmaticus: Secondary | ICD-10-CM

## 2010-11-14 ENCOUNTER — Ambulatory Visit (INDEPENDENT_AMBULATORY_CARE_PROVIDER_SITE_OTHER)
Admission: RE | Admit: 2010-11-14 | Discharge: 2010-11-14 | Disposition: A | Discharge status: Home / Self Care | Payer: BC Managed Care – PPO | Source: Ambulatory Visit | Attending: Family Medicine | Admitting: Family Medicine

## 2010-11-14 ENCOUNTER — Encounter: Payer: Self-pay | Admitting: Family Medicine

## 2010-11-14 ENCOUNTER — Ambulatory Visit (INDEPENDENT_AMBULATORY_CARE_PROVIDER_SITE_OTHER): Payer: BC Managed Care – PPO | Admitting: Family Medicine

## 2010-11-14 ENCOUNTER — Other Ambulatory Visit: Payer: Self-pay | Admitting: Family Medicine

## 2010-11-14 ENCOUNTER — Telehealth: Payer: Self-pay | Admitting: Family Medicine

## 2010-11-14 DIAGNOSIS — R059 Cough, unspecified: Secondary | ICD-10-CM | POA: Insufficient documentation

## 2010-11-14 DIAGNOSIS — R05 Cough: Secondary | ICD-10-CM

## 2010-11-14 DIAGNOSIS — I10 Essential (primary) hypertension: Secondary | ICD-10-CM

## 2010-11-14 DIAGNOSIS — J45909 Unspecified asthma, uncomplicated: Secondary | ICD-10-CM

## 2010-11-14 NOTE — Assessment & Plan Note (Signed)
Summary: Wheezing, shortness of breath   Vital Signs:  Patient profile:   45 year old female Height:      62.75 inches Temp:     99.1 degrees F tympanic Pulse rate:   150 / minute Pulse rhythm:   regular BP sitting:   160 / 100  (right arm) Cuff size:   regular  Vitals Entered By: Linde Gillis CMA [name]) (November 07, 2010 10:20 AM) CC: wheezing, shortness of breath   History of Present Illness: 45 year old female:  I am emergently pulled from an examination room to evaluate this patient in acute distress by our nursing staff.   "Felt like 2 asthma attacks this morning"  On my initial evaluation, she was breathing at 60 resps per minute. Increased work of breathing.  Feels very tight and actively wheezing. Pulse ox was 97%, Tachycardic to 120-150.  s/p 2 inhalations of albuterol at home.  Immediately initiated nebulized albuterol. Then given IM decadron, 8 mg. Placed on continuous pulse ox.  Ranged from 97-100%.  Continued to be respiring 50-60 times a minute, and with increased work of breathing. Had a sensation of tingling in her hands.  EMS called to emergently evaluate and transport to [facility].  No prior intubations or ICU stays for asthma.  Allergies: 1)  ! Codeine 2)  ! * Tape Adhesive 3)  ! Hydrocodone 4)  ! Vicodin  Past History:  Past medical, surgical, family and social histories (including risk factors) reviewed, and no changes noted (except as noted below).  Past Medical History: Reviewed history from 01/22/2010 and no changes required. Allergic rhinitis Asthma GERD Calcaneal fracture -   MD roster: Jenna Luo at [facility]  Past Surgical History: Reviewed history from 01/22/2010 and no changes required. tonsillectomy age 59 bunionectomy R) 13 years ago (11/04/05) 1st visit cholecystectomy 20 yrs ago back surg 10/06-- laminectomy L 4-5 --Ray knee surg x 2 Murphy/ Fannie Knee compressed pronator R)-- Sypher multiple  lithotripsies  Family History: Reviewed history from 01/25/2010 and no changes required. Family History of Heart Disease: Father Family History of Colon Cancer: Paternal Grandfather, Maternal Grandfather Family History of Colon Polyps: Father Family History of Uterine Cancer: Grandmother Family History of Breast Cancer: Grandmother Family History of Ovarian Cancer: Grandmother Family History of Diabetes: Mother  Social History: Reviewed history from 01/25/2010 and no changes required. 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  General:  Denies fever. CV:  Denies chest pain or discomfort. Resp:  Complains of cough, shortness of breath, and wheezing; denies sputum productive. GI:  Complains of nausea and vomiting. Psych:  Complains of anxiety.  Physical Exam  General:  alert, well-developed, and well-nourished.   Moderate respiratory distress. Head:  Normocephalic and atraumatic without obvious abnormalities. No apparent alopecia or balding. Ears:  External ear exam shows no significant lesions or deformities.  Otoscopic examination reveals clear canals, tympanic membranes are intact bilaterally without bulging, retraction, inflammation or discharge. Hearing is grossly normal bilaterally. Nose:  External nasal examination shows no deformity or inflammation. Nasal mucosa are pink and moist without lesions or exudates. Mouth:  Oral mucosa and oropharynx without lesions or exudates.  Teeth in good repair. Neck:  No deformities, masses, or tenderness noted. Lungs:  Decreased BS diffusely. increased accessory muscle and scalene use as well as  intercostal retractions. No crackles or rhonchi. Heart:  tachycardic to 130 no murmur, no gallop, and no rub.   Abdomen:  Bowel sounds positive,abdomen soft and non-tender without masses, organomegaly or hernias noted. Extremities:  tense fingers, mild tremor Neurologic:  gait normal.   Cervical Nodes:  No lymphadenopathy noted Psych:  anxious appearing   Impression & Recommendations:  Problem # 1:  STATUS ASTHMATICUS (ICD-493.91) Assessment New  Respiratory distress, minimal improvement in tachypnea and hyperventilation s/p alb nebs x 1 and alb / atrovent x 1 in the office.  Decadron given 8 mg IM.  Called EMS for emergent transport to [facility] for potential hypercapneic respiratory failure if high ventilation continues and needs high level care ASAP.  Her updated medication list for this problem includes:    Proair Hfa 108 (90 Base) Mcg/act Aers (Albuterol sulfate) .Marland Kitchen... 2 inh q4h as needed shortness of breath  Orders: Pulse Oximetry (94760)  Problem # 2:  HYPERVENTILATION (ICD-786.01) Assessment: New  Orders: Pulse Oximetry (94760)  Complete Medication List: 1)  Sprintec 28 0.25-35 Mg-mcg Tabs (Norgestimate-eth estradiol) .... Take 1 tablet by mouth once a day 2)  Gabapentin 400 Mg Caps (Gabapentin) .... One tablet by mouth two times a day at bedtime 3)  Trazodone Hcl 100 Mg Tabs (Trazodone hcl) .... Take 1 tablet at bedtime 4)  Cymbalta 60 Mg Cpep (Duloxetine hcl) .... Take 1 tab by mouth at bedtime 5)  Lisinopril 10 Mg Tabs (Lisinopril) .... Take one tablet by mouth daily 6)  B-100 Tabs (Vitamins-lipotropics) .... Otc as directed. 7)  Hydrochlorothiazide 25 Mg Tabs (Hydrochlorothiazide) .... Take 1 tab by mouth every morning 8)  Nitrostat 0.4 Mg Subl (Nitroglycerin) .Marland Kitchen.. 1 tablet under tongue at onset of chest pain; you may repeat every 5 minutes for up to 3 doses. 9)  Imitrex 100 Mg Tabs (Sumatriptan succinate) .Marland Kitchen.. 1 by mouth at the onset of migraine. may  repeat dose in one hour if headache not resolved 10)  Proair Hfa 108 (90 Base) Mcg/act Aers (Albuterol sulfate) .... 2 inh q4h as needed shortness of breath 11)  Fluticasone Propionate 50 Mcg/act Susp (Fluticasone propionate) .... 2 sprays each nostril once daily  Other Orders: Albuterol Sulfate Sol 1mg  unit dose (Z6109) Nebulizer Tx (60454)   Medication Administration  Injection # 1:    Medication: Dexamethasone Sodium Phosphate 1mg     Diagnosis: ASTHMA (ICD-493.90)    Route: IM    Site: L deltoid    Exp Date: 01/07/2012    Lot #: [id number]    Mfr: Sanofi Pasteur    Patient tolerated injection without complications    Given by: Benny Lennert CMA (AAMA) (November 07, 2010 10:26 AM)  Medication # 1:    Medication: Albuterol Sulfate Sol 1mg  unit dose    Diagnosis: ASTHMA (ICD-493.90)    Dose: 3mg      Route: inhaled    Patient tolerated medication without complications    Given by: Benny Lennert CMA [name]) (November 07, 2010 10:26 AM)  Orders Added: 1)  Albuterol Sulfate Sol 1mg  unit dose [J7613] 2)  Nebulizer Tx [94640] 3)  Est. Patient Level V [99215] 4)  Pulse Oximetry [94760]    Current Allergies (reviewed today): ! CODEINE ! * TAPE ADHESIVE ! HYDROCODONE ! VICODIN

## 2010-11-19 NOTE — Letter (Signed)
Summary: Out of Work  [facility] [employer] [facility] at [facility]  [address] [place], [place] [address]   Phone: [phone]  Fax: [phone]    November 14, 2010   Employee:  SIMONNE BOULOS    To Whom It May Concern:   For Medical reasons, please excuse the above named employee from work for the following dates:  Start:   11/14/2010  End:   11/18/2010  If you need additional information, please feel free to contact our office.         Sincerely,       Dr. [name]

## 2010-11-19 NOTE — Progress Notes (Signed)
Summary: still having SOB  Phone Note Call from Patient Call back at Work Phone [phone]   Caller: Patient Call For: [name] MD Summary of Call: Patient says that she is now finished with the prednisone, but she is still having alot of trouble breathing, last night she had to sit up to breathe. Still having some wheezing. She is asking if she needs to try something else. Uses [facility] [address]  Initial call taken by: Melody Comas,  November 14, 2010 8:43 AM  Follow-up for Phone Call        I will send in another rx for prednisone, she may just need a little more time on prednisone.  If this does not help, we need to refer to pulmonary. [name] MD  November 14, 2010 8:49 AM   Before I was able to call patient back she called and spoke with Lyla Son and scheduled appt for today at 9:45.  Follow-up by: Melody Comas,  November 14, 2010 9:01 AM    New/Updated Medications: PREDNISONE 20 MG TABS (PREDNISONE) 3 tabs by mouth x 3 days, 2 tabs by mouth x 3 days, 1 tab by mouth x 2 day, 1/2 tab by mouth x 1 day and stop. dispense qs Prescriptions: PREDNISONE 20 MG TABS (PREDNISONE) 3 tabs by mouth x 3 days, 2 tabs by mouth x 3 days, 1 tab by mouth x 2 day, 1/2 tab by mouth x 1 day and stop. dispense qs  #1 x 0   Entered and Authorized by:   [name] MD   Signed by:   [name] MD on 11/14/2010   Method used:   [facility]ronically to        [facility]       [address]       [place], [place]  [address]       Ph: [phone]       Fax: [phone]   RxID:   [id number]

## 2010-11-19 NOTE — Assessment & Plan Note (Signed)
Summary: DISCUSS ASTHMA TREATMENT   Vital Signs:  Patient profile:   45 year old female Height:      62.75 inches Weight:      182.50 pounds BMI:     32.70 Temp:     99.0 degrees F oral Pulse rate:   80 / minute Pulse rhythm:   regular BP sitting:   140 / 100  (right arm) Cuff size:   regular  Vitals Entered By: Linde Gillis CMA [name]) (November 11, 2010 3:06 PM) CC: ER follow up   History of Present Illness: 45 yo here for ER follow up.   Notes reviewed, we sent her ER via EMS on 11/07/2010 after she presented to our office with an asthma attack not responding to her inhalers. Given albuterol nebs and IM decadron in office, remain tachypneic and tachycardiac (normal O2 sats), Dr. Patsy Lager appriately called EMS to prevent hypercapneic respiratory failure.  In ER, receivied IV magnesium, Ativan and fluids. CXR negative. Sent home with by mouth prednisone, has two doses left- 20 mg tomorrow, 10 mg the following day.  Overall, feels much better.  Chest still tight. Has never had an asthma attack like this in past.  Usually only wheezes a couple of times a year when she has a URI.  Current Medications (verified): 1)  Sprintec 28 0.25-35 Mg-Mcg  Tabs (Norgestimate-Eth Estradiol) .... Take 1 Tablet By Mouth Once A Day 2)  Gabapentin 400 Mg Caps (Gabapentin) .... One Tablet By Mouth Two Times A Day At Bedtime 3)  Trazodone Hcl 100 Mg  Tabs (Trazodone Hcl) .... Take 1 Tablet At Bedtime 4)  Cymbalta 60 Mg  Cpep (Duloxetine Hcl) .... Take 1 Tab By Mouth At Bedtime 5)  B-100  Tabs (Vitamins-Lipotropics) .... Otc As Directed. 6)  Lisinopril-Hydrochlorothiazide 20-25 Mg Tabs (Lisinopril-Hydrochlorothiazide) .Marland Kitchen.. 1 Tab By Mouth Daily. 7)  Nitrostat 0.4 Mg Subl (Nitroglycerin) .Marland Kitchen.. 1 Tablet Under Tongue At Onset of Chest Pain; You May Repeat Every 5 Minutes For Up To 3 Doses. 8)  Imitrex 100 Mg Tabs (Sumatriptan Succinate) .Marland Kitchen.. 1 By Mouth At The Onset of Migraine. May Repeat Dose in One Hour If  Headache Not Resolved 9)  Proair Hfa 108 (90 Base) Mcg/act  Aers (Albuterol Sulfate) .... 2 Inh Q4h As Needed Shortness of Breath 10)  Fluticasone Propionate 50 Mcg/act  Susp (Fluticasone Propionate) .... 2 Sprays Each Nostril Once Daily 11)  Advair Diskus 250-50 Mcg/dose Misc (Fluticasone-Salmeterol) .Marland Kitchen.. 1 Puff 2 Times Daily 12)  Singulair 10 Mg Tabs (Montelukast Sodium) .Marland Kitchen.. 1 By Mouth Daily  Allergies: 1)  ! Codeine 2)  ! * Tape Adhesive 3)  ! Hydrocodone 4)  ! Vicodin  Past History:  Past Medical History: Last updated: 01/22/2010 Allergic rhinitis Asthma GERD Calcaneal fracture -   MD roster: Jenna Luo at [facility]  Past Surgical History: Last updated: 01/22/2010 tonsillectomy age 73 bunionectomy R) 13 years ago (11/04/05) 1st visit cholecystectomy 20 yrs ago back surg 10/06-- laminectomy L 4-5 --Ray knee surg x 2 Murphy/ Fannie Knee compressed pronator R)-- Sypher multiple lithotripsies  Family History: Last updated: 01/25/2010 Family History of Heart Disease: Father Family History of Colon Cancer: Paternal Grandfather, Maternal Grandfather Family History of Colon Polyps: Father Family History of Uterine Cancer: Grandmother Family History of Breast Cancer: Grandmother Family History of Ovarian Cancer: Grandmother Family History of Diabetes: Mother  Social History: Last updated: 01/25/2010 Marital Status: Married Children: 3 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 Resp:  Complains of cough and wheezing; denies shortness of breath and sputum productive.  Physical Exam  General:  alert, well-developed, and well-nourished.   VSS, no  increased WOB. Mouth:  Oral mucosa and oropharynx without lesions or exudates.  Teeth in good repair. Lungs:  Normal respiratory effort, chest expands symmetrically. Lungs are clear to auscultation, no crackles or wheezes. Heart:  tachycardic to 130 no murmur, no gallop, and no rub.   Extremities:  No clubbing, cyanosis, edema, or deformity noted with normal full range of motion of all joints.   Neurologic:  alert & oriented X3.   Psych:  anxious appearing   Impression & Recommendations:  Problem # 1:  STATUS ASTHMATICUS (ICD-493.91) Assessment Improved Discussed asthma maintenance from this point on.   Likely has a seasonal component, will add Singular and advair. Finish course of prednisone. Pt to follow up in 2 weeks. Her updated medication list for this problem includes:    Proair Hfa 108 (90 Base) Mcg/act Aers (Albuterol sulfate) .Marland Kitchen... 2 inh q4h as needed shortness of breath    Advair Diskus 250-50 Mcg/dose Misc (Fluticasone-salmeterol) .Marland Kitchen... 1 puff 2 times daily    Singulair 10 Mg Tabs (Montelukast sodium) .Marland Kitchen... 1 by mouth daily  Problem # 2:  HYPERTENSION (ICD-401.9) Assessment: Deteriorated likely due to prednisone, albuterol and asthma flare. will increase lisinopril for time being, will recheck in 2 weeks. Likely can wean back down once her asthma is better controlled. The following medications were removed from the medication list:    Lisinopril 10 Mg Tabs (Lisinopril) .Marland Kitchen... Take one tablet by mouth daily Her updated medication list for this problem includes:    Lisinopril-hydrochlorothiazide 20-25 Mg Tabs (Lisinopril-hydrochlorothiazide) .Marland Kitchen... 1 tab by mouth daily.  Complete Medication List: 1)  Sprintec 28 0.25-35 Mg-mcg Tabs (Norgestimate-eth estradiol) .... Take 1 tablet by mouth once a day 2)  Gabapentin 400 Mg Caps (Gabapentin) .... One tablet by mouth two times a day at bedtime 3)  Trazodone Hcl 100 Mg Tabs (Trazodone hcl) .... Take 1 tablet at bedtime 4)   Cymbalta 60 Mg Cpep (Duloxetine hcl) .... Take 1 tab by mouth at bedtime 5)  B-100 Tabs (Vitamins-lipotropics) .... Otc as directed. 6)  Lisinopril-hydrochlorothiazide 20-25 Mg Tabs (Lisinopril-hydrochlorothiazide) .Marland Kitchen.. 1 tab by mouth daily. 7)  Nitrostat 0.4 Mg Subl (Nitroglycerin) .Marland Kitchen.. 1 tablet under tongue at onset of chest pain; you may repeat every 5 minutes for up to 3 doses. 8)  Imitrex 100 Mg Tabs (Sumatriptan succinate) .Marland Kitchen.. 1 by mouth at the onset of migraine. may repeat dose in one hour if headache not resolved 9)  Proair Hfa 108 (90 Base) Mcg/act Aers (Albuterol sulfate) .... 2 inh q4h as needed shortness of breath 10)  Fluticasone Propionate 50 Mcg/act Susp (Fluticasone propionate) .... 2 sprays each nostril once daily 11)  Advair Diskus 250-50 Mcg/dose Misc (Fluticasone-salmeterol) .Marland Kitchen.. 1 puff 2 times daily 12)  Singulair 10 Mg Tabs (Montelukast sodium) .Marland Kitchen.. 1 by mouth daily  Patient Instructions: 1)  Good to see you, Melissa Allen. 2)  Let's start your Advair today- 1 puff twice daily. 3)  Add Singulair 10 mg daily. 4)  Call me in 2 weeks with an update. Prescriptions: LISINOPRIL-HYDROCHLOROTHIAZIDE 20-25 MG  TABS (LISINOPRIL-HYDROCHLOROTHIAZIDE) 1 tab by mouth daily.  #30 x 3   Entered and Authorized by:   [name] MD   Signed by:   [name] MD on 11/11/2010   Method used:   Electronically to        [facility]       [address]       [place], [place]  [address]       Ph: [phone]       Fax: [phone]   RxID:   [id number] ADVAIR DISKUS 250-50 MCG/DOSE MISC (FLUTICASONE-SALMETEROL) 1 puff 2 times daily  #1 x 1   Entered and Authorized by:   [name] MD   Signed by:   [name] MD on 11/11/2010   Method used:   Historical   RxID:   [id number] SINGULAIR 10 MG TABS (MONTELUKAST SODIUM) 1 by mouth daily  #90 x 3   Entered and Authorized by:   [name] MD   Signed by:   [name] MD on 11/11/2010    Method used:   Electronically to        [facility]       [address]       [place], [place]  [address]       Ph: [phone]       Fax: [phone]   RxID:   [id number]    Orders Added: 1)  Est. Patient Level IV [32355]    Current Allergies (reviewed today): ! CODEINE ! * TAPE ADHESIVE ! HYDROCODONE ! VICODIN

## 2010-11-19 NOTE — Assessment & Plan Note (Signed)
Summary: ASTHMA/CLE  BCBS   Vital Signs:  Patient profile:   45 year old female Height:      62.75 inches Weight:      182.50 pounds BMI:     32.70 O2 Sat:      98 % on Room air Temp:     98.5 degrees F oral Pulse rate:   97 / minute Pulse rhythm:   regular BP sitting:   130 / 94  (left arm) Cuff size:   regular  Vitals Entered By: Linde Gillis CMA [name]) (November 14, 2010 9:52 AM)  O2 Flow:  Room air CC: asthma   History of Present Illness: 45 yo here for persistent SOB.  Finished last dose of Prednisone and still feels SOB. Not wheezing but feels like she cannot get a deep breath.  Started Singulair 10 mg daily and Adviar 250-50 two times a day on 11/11/3010, has not noticed any improvement yet.  Has a dry cough still.    Does not feel nearly as bad as she did when she went to the ER.  Current Medications (verified): 1)  Sprintec 28 0.25-35 Mg-Mcg  Tabs (Norgestimate-Eth Estradiol) .... Take 1 Tablet By Mouth Once A Day 2)  Gabapentin 400 Mg Caps (Gabapentin) .... One Tablet By Mouth Two Times A Day At Bedtime 3)  Trazodone Hcl 100 Mg  Tabs (Trazodone Hcl) .... Take 1 Tablet At Bedtime 4)  Cymbalta 60 Mg  Cpep (Duloxetine Hcl) .... Take 1 Tab By Mouth At Bedtime 5)  B-100  Tabs (Vitamins-Lipotropics) .... Otc As Directed. 6)  Maxzide-25 37.5-25 Mg Tabs (Triamterene-Hctz) .Marland Kitchen.. 1 Tab By Mouth Daily. 7)  Nitrostat 0.4 Mg Subl (Nitroglycerin) .Marland Kitchen.. 1 Tablet Under Tongue At Onset of Chest Pain; You May Repeat Every 5 Minutes For Up To 3 Doses. 8)  Imitrex 100 Mg Tabs (Sumatriptan Succinate) .Marland Kitchen.. 1 By Mouth At The Onset of Migraine. May Repeat Dose in One Hour If Headache Not Resolved 9)  Proair Hfa 108 (90 Base) Mcg/act  Aers (Albuterol Sulfate) .... 2 Inh Q4h As Needed Shortness of Breath 10)  Fluticasone Propionate 50 Mcg/act  Susp (Fluticasone Propionate) .... 2 Sprays Each Nostril Once Daily 11)  Advair Diskus 250-50 Mcg/dose Misc (Fluticasone-Salmeterol) .Marland Kitchen.. 1 Puff 2 Times  Daily 12)  Singulair 10 Mg Tabs (Montelukast Sodium) .Marland Kitchen.. 1 By Mouth Daily 13)  Prednisone 20 Mg Tabs (Prednisone) .... As Directed  Allergies: 1)  ! Codeine 2)  ! * Tape Adhesive 3)  ! Hydrocodone 4)  ! Vicodin  Past History:  Past Medical History: Last updated: 01/22/2010 Allergic rhinitis Asthma GERD Calcaneal fracture -   MD roster: Jenna Luo at [facility]  Past Surgical History: Last updated: 01/22/2010 tonsillectomy age 14 bunionectomy R) 13 years ago (11/04/05) 1st visit cholecystectomy 20 yrs ago back surg 10/06-- laminectomy L 4-5 --Ray knee surg x 2 Murphy/ Fannie Knee compressed pronator R)-- Sypher multiple lithotripsies  Family History: Last updated: 01/25/2010 Family History of Heart Disease: Father Family History of Colon Cancer: Paternal Grandfather, Maternal Grandfather Family History of Colon Polyps: Father Family History of Uterine Cancer: Grandmother Family History of Breast Cancer: Grandmother Family History of Ovarian Cancer: Grandmother Family History of Diabetes: Mother  Social History: Last updated: 01/25/2010 Marital Status: Married Children: 3 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 ENT:  Denies nasal congestion and postnasal drainage. Resp:  Complains of cough and shortness of breath; denies sputum productive and wheezing.  Physical Exam  General:  alert, well-developed, and well-nourished.   VSS, no increased WOB. O2 sat 98% on RA Mouth:  Oral mucosa and oropharynx without lesions or exudates.  Teeth in good repair. Lungs:  Normal respiratory effort, chest expands symmetrically. Lungs are clear to auscultation, no  crackles or wheezes. Heart:  tachycardic to 97 no murmur, no gallop, and no rub.   Extremities:  No clubbing, cyanosis, edema, or deformity noted with normal full range of motion of all joints.   Psych:  anxious appearing   Impression & Recommendations:  Problem # 1:  ASTHMA (ICD-493.90) Assessment Deteriorated Lungs clear without wheezes or crackles. Given albuterol/atrovent nebs in office with mild improvement of symptoms. Since she is on an ACEI, will d/c this since we need to eliminate all possible aggrevating factors.  Also order CXR today. Extend course of prednisone and continue Singulair and Advair. If symptoms continue despite all of these changes, will refer to pulmonologist. The following medications were removed from the medication list:    Prednisone 20 Mg Tabs (Prednisone) .Marland KitchenMarland KitchenMarland KitchenMarland Kitchen 3 tabs by mouth x 3 days, 2 tabs by mouth x 3 days, 1 tab by mouth x 2 day, 1/2 tab by mouth x 1 day and stop. dispense qs Her updated medication list for this problem includes:    Proair Hfa 108 (90 Base) Mcg/act Aers (Albuterol sulfate) .Marland Kitchen... 2 inh q4h as needed shortness of breath    Advair Diskus 250-50 Mcg/dose Misc (Fluticasone-salmeterol) .Marland Kitchen... 1 puff 2 times daily    Singulair 10 Mg Tabs (Montelukast sodium) .Marland Kitchen... 1 by mouth daily    Prednisone 20 Mg Tabs (Prednisone) .Marland Kitchen... As directed  Orders: Albuterol Sulfate Sol 1mg  unit dose (Z6109) Ipratropium inhalation sol. unit dose (U0454) Nebulizer Tx (09811)  Problem # 2:  HYPERTENSION (ICD-401.9) Assessment: Unchanged see above. D/c lisinopril and add Trimaterene to HCTZ. Follow up in 2 weeks. Her updated medication list for this problem includes:    Maxzide-25 37.5-25 Mg Tabs (Triamterene-hctz) .Marland Kitchen... 1 tab by mouth daily.  Complete Medication List: 1)  Sprintec 28 0.25-35 Mg-mcg Tabs (Norgestimate-eth estradiol) .... Take 1 tablet by mouth once a day 2)  Gabapentin 400 Mg Caps (Gabapentin) .... One tablet by mouth two times a day at  bedtime 3)  Trazodone Hcl 100 Mg Tabs (Trazodone hcl) .... Take 1 tablet at bedtime 4)  Cymbalta 60 Mg Cpep (Duloxetine hcl) .... Take 1 tab by mouth at bedtime 5)  B-100 Tabs (Vitamins-lipotropics) .... Otc as directed. 6)  Maxzide-25 37.5-25 Mg Tabs (Triamterene-hctz) .Marland Kitchen.. 1 tab by mouth daily. 7)  Nitrostat 0.4 Mg Subl (Nitroglycerin) .Marland Kitchen.. 1 tablet under tongue at onset of chest pain; you may repeat every 5 minutes for up to 3 doses. 8)  Imitrex 100 Mg Tabs (Sumatriptan succinate) .Marland Kitchen.. 1 by mouth at the onset of migraine. may repeat dose in one hour if headache not resolved 9)  Proair Hfa 108 (90 Base) Mcg/act Aers (Albuterol sulfate) .... 2 inh q4h as needed shortness of breath 10)  Fluticasone Propionate 50 Mcg/act Susp (Fluticasone propionate) .... 2 sprays each nostril once daily 11)  Advair Diskus 250-50 Mcg/dose Misc (Fluticasone-salmeterol) .Marland Kitchen.. 1 puff 2 times daily 12)  Singulair 10 Mg Tabs (Montelukast  sodium) .Marland Kitchen.. 1 by mouth daily 13)  Prednisone 20 Mg Tabs (Prednisone) .... As directed  Other Orders: T-2 View CXR (71020TC)  Patient Instructions: 1)  Please stop taking the Lisinopirl-HCTZ. 2)  Start taking the Maxzide. 3)  Follow up with me in 2 weeks. Prescriptions: MAXZIDE-25 37.5-25 MG TABS (TRIAMTERENE-HCTZ) 1 tab by mouth daily.  #30 x 1   Entered and Authorized by:   [name] MD   Signed by:   [name] MD on 11/14/2010   Method used:   Electronically to        [facility]       [address]       [place], [place]  [address]       Ph: [phone]       Fax: [phone]   RxID:   [id number]    Medication Administration  Medication # 1:    Medication: Albuterol Sulfate Sol 1mg  unit dose    Diagnosis: ASTHMA (ICD-493.90)    Route: po    Exp Date: 03/08/2012    Lot #: [id number]    Mfr: Nephron    Comments: 2.5mg /25ml    Patient tolerated medication without complications    Given by: Linde Gillis  CMA [name]) (November 14, 2010 10:19 AM)  Medication # 2:    Medication: Ipratropium inhalation sol. unit dose    Diagnosis: ASTHMA (ICD-493.90)    Route: po    Exp Date: 03/08/2012    Lot #: [id number]    Mfr: Nephron    Comments: 0.5mg     Patient tolerated medication without complications    Given by: Linde Gillis CMA [name]) (November 14, 2010 10:19 AM)  Orders Added: 1)  T-2 View CXR [71020TC] 2)  Albuterol Sulfate Sol 1mg  unit dose [J7613] 3)  Ipratropium inhalation sol. unit dose [J7644] 4)  Nebulizer Tx [94640] 5)  Est. Patient Level IV [25366]    Current Allergies (reviewed today): ! CODEINE ! * TAPE ADHESIVE ! HYDROCODONE ! VICODIN

## 2010-11-23 ENCOUNTER — Encounter: Payer: Self-pay | Admitting: Family Medicine

## 2010-11-29 LAB — POCT I-STAT, CHEM 8
Chloride: 104 mEq/L (ref 96–112)
Creatinine, Ser: 0.7 mg/dL (ref 0.4–1.2)
Glucose, Bld: 88 mg/dL (ref 70–99)
Potassium: 3.6 mEq/L (ref 3.5–5.1)

## 2010-11-29 LAB — CBC
HCT: 41.8 % (ref 36.0–46.0)
Hemoglobin: 14.4 g/dL (ref 12.0–15.0)
MCHC: 34.5 g/dL (ref 30.0–36.0)
MCV: 89.2 fL (ref 78.0–100.0)
Platelets: 247 K/uL (ref 150–400)
RBC: 4.69 MIL/uL (ref 3.87–5.11)
RDW: 12.7 % (ref 11.5–15.5)
WBC: 6.6 K/uL (ref 4.0–10.5)

## 2010-11-29 LAB — DIFFERENTIAL
Basophils Absolute: 0 K/uL (ref 0.0–0.1)
Basophils Relative: 1 % (ref 0–1)
Eosinophils Absolute: 0.1 K/uL (ref 0.0–0.7)
Eosinophils Relative: 2 % (ref 0–5)
Lymphocytes Relative: 28 % (ref 12–46)
Lymphs Abs: 1.9 K/uL (ref 0.7–4.0)
Monocytes Absolute: 0.4 K/uL (ref 0.1–1.0)
Monocytes Relative: 6 % (ref 3–12)
Neutro Abs: 4.1 K/uL (ref 1.7–7.7)
Neutrophils Relative %: 63 % (ref 43–77)

## 2010-11-29 LAB — POCT CARDIAC MARKERS
CKMB, poc: <1 ng/mL — ABNORMAL LOW (ref 1.0–8.0)
Troponin i, poc: <0.05 ng/mL (ref 0.00–0.09)

## 2011-01-06 ENCOUNTER — Telehealth: Payer: Self-pay | Admitting: *Deleted

## 2011-01-06 ENCOUNTER — Ambulatory Visit (INDEPENDENT_AMBULATORY_CARE_PROVIDER_SITE_OTHER): Payer: BC Managed Care – PPO | Admitting: Family Medicine

## 2011-01-06 ENCOUNTER — Encounter: Payer: Self-pay | Admitting: *Deleted

## 2011-01-06 VITALS — BP 128/84 | HR 66 | Temp 98.8°F | Wt 174.8 lb

## 2011-01-06 DIAGNOSIS — R1110 Vomiting, unspecified: Secondary | ICD-10-CM

## 2011-01-06 MED ORDER — DULOXETINE HCL 60 MG PO CPEP: 60.0000 mg | ORAL_CAPSULE | Freq: Every day | ORAL | Status: DC

## 2011-01-06 MED ORDER — GABAPENTIN 400 MG PO CAPS: 400.0000 mg | ORAL_CAPSULE | Freq: Two times a day (BID) | ORAL | Status: DC

## 2011-01-06 MED ORDER — TRAZODONE HCL 100 MG PO TABS: 100.0000 mg | ORAL_TABLET | Freq: Every day | ORAL | Status: DC

## 2011-01-06 MED ORDER — ONDANSETRON HCL 4 MG PO TABS: 4.0000 mg | ORAL_TABLET | Freq: Three times a day (TID) | ORAL | Status: AC | PRN

## 2011-01-06 NOTE — Telephone Encounter (Signed)
Pharmacy notified.

## 2011-01-06 NOTE — Progress Notes (Signed)
  Subjective:     Melissa Allen is a 45 y.o. female who presents for evaluation of nausea and vomiting. Onset of symptoms was several days ago. Patient describes nausea as moderate. Vomiting has occurred 3 times over the past 3 days. Vomitus is described as normal gastric contents. Symptoms have been associated with diarrhea occurring several times per day.. Patient denies alcohol overuse, fever, hematemesis, melena and possibility of pregnancy. Symptoms have gradually improved. Evaluation to date has been none. Treatment to date has been none.   Ran out of her Cymbalta the day before these symptoms started. Her rheumatologist did not refill it over the weekend.   The PMH, PSH, Social History, Family History, Medications, and allergies have been reviewed in [facility], and have been updated if relevant.   Review of Systems Pertinent items are noted in HPI.   Objective:    BP 128/84  Pulse 66  Temp 98.8 F (37.1 C)  Wt 174 lb 12.8 oz (79.289 kg)  General Appearance:    Alert, cooperative, no distress, appears stated age  Head:    Normocephalic, without obvious abnormality, atraumatic  Eyes:    PERRL, conjunctiva/corneas clear, EOM's intact, fundi    benign, both eyes  Ears:    Normal TM's and external ear canals, both ears  Nose:   Nares normal, septum midline, mucosa normal, no drainage    or sinus tenderness  Throat:   Lips, mucosa, and tongue normal; teeth and gums normal  Neck:   Supple, symmetrical, trachea midline, no adenopathy;    thyroid:  no enlargement/tenderness/nodules; no carotid   bruit or JVD  Back:     Symmetric, no curvature, ROM normal, no CVA tenderness  Lungs:     Clear to auscultation bilaterally, respirations unlabored  Chest Wall:    No tenderness or deformity   Heart:    Regular rate and rhythm, S1 and S2 normal, no murmur, rub   or gallop     Abdomen:     Soft, non-tender, bowel sounds active all four quadrants,    no masses, no organomegaly  Extremities:    Extremities normal, atraumatic, no cyanosis or edema  Pulses:   2+ and symmetric all extremities  Skin:   Skin color, texture, turgor normal, no rashes or lesions  Lymph nodes:   Cervical, supraclavicular, and axillary nodes normal  Neurologic:   CNII-XII intact, normal strength, sensation and reflexes    throughout     Assessment:    Nausea and vomiting   Plan:    Antiemetics per medication orders.  I sent in refills for her Cymbalta and advised following up with rheum.

## 2011-01-06 NOTE — Telephone Encounter (Signed)
May need to clarify with patient since I am not typically the physician that prescribes this for her but I think it is 400 mg twice daily

## 2011-01-06 NOTE — Telephone Encounter (Signed)
[facility] in [place] is asking for clarification on gabapentin script, they have 2 sets of directions.

## 2011-01-14 ENCOUNTER — Other Ambulatory Visit: Payer: Self-pay | Admitting: Family Medicine

## 2011-01-21 NOTE — Op Note (Signed)
Melissa Allen, Melissa Allen               ACCOUNT NO.:  [id number]   MEDICAL RECORD NO.:  [id number]          PATIENT TYPE:  AMB   LOCATION:  [facility]                         FACILITY:  [facility]   PHYSICIAN:  Excell Seltzer. Annabell Howells, M.D.    DATE OF BIRTH:  04-23-1966   DATE OF PROCEDURE:  09/06/2007  DATE OF DISCHARGE:                               OPERATIVE REPORT   PROCEDURE:  Laparoscopic stone extraction.   PREOPERATIVE DIAGNOSIS:  Left ureterovesical junction stone.   POSTOPERATIVE DIAGNOSIS:  Left ureterovesical junction stone.   SURGEON:  Excell Seltzer. Annabell Howells, M.D.   ANESTHESIA:  General.   SPECIMEN:  Stone.   COMPLICATIONS:  None.   INDICATIONS:  [name] is a 45 year old white female with a history of  stones; who presented with severe bladder pressure and irritability.  She was found to have a 3 mm left distal ureteral stone, and elected  ureteroscopic stone extraction as her treatment option.  She was offered  observation and lithotripsy.   FINDINGS OF THE PROCEDURE:  The patient was taken to the operating room.  She received Cipro.  She was given a general anesthetic.  She was placed  in the lithotomy position.  Her perineum and genitalia were prepped with  Betadine solution.  She was draped in the usual sterile fashion.  The 6-  French short ureteroscope was passed per urethra.  Inspection revealed a  normal bladder and ureteral orifices were unremarkable.  The left  ureteral orifice was cannulated with a scope.  The stone was visualized;  it was grasped with a nitinol basket.  The scope was used to dilate the  distal ureter.  The stone was then removed without difficulty.  The  bladder was then drained using the cystoscope sheath.  She was taken  down from the lithotomy position.  Her anesthetic was reversed.  She was  moved to the recovery room in stable condition.  There were no  complications.      Excell Seltzer. Annabell Howells, M.D.  Electronically Signed     JJW/MEDQ  D:  09/06/2007  T:   09/06/2007  Job:  [id number]

## 2011-01-24 NOTE — H&P (Signed)
NAMEESTEPHANI, POPPER               ACCOUNT NO.:  [id number]   MEDICAL RECORD NO.:  [id number]          PATIENT TYPE:  AMB   LOCATION:  SDS                          FACILITY:  [facility]   PHYSICIAN:  Payton Doughty, M.D.      DATE OF BIRTH:  12/16/1965   DATE OF ADMISSION:  07/01/2005  DATE OF DISCHARGE:                                HISTORY & PHYSICAL   ADMISSION DIAGNOSIS:  Herniated disk disease L4-5.   HISTORY OF PRESENT ILLNESS:  [name] is a 45 year old right-handed white  female who says she has always been having pain in her back, mostly in her  right hip and leg. MRI demonstrates foraminal disk at L4-5 on the right.  Nothing on the right side and bladder is not affected. She has a small disk  at L1-2 which is also on the right but she declined epidural steroid  injection and is now admitted for diskectomy.   PAST MEDICAL HISTORY:  Benign.   MEDICATIONS:  Oral contraceptives and Ambien at night.   ALLERGIES:  She gets nausea with CODEINE AND VICODIN. She has been using a  lot of Advil.   PAST SURGICAL HISTORY:  Cholecystectomy 15 years ago. Tonsillectomy 15 years  ago. Pronator release in the right forearm by Dr. Marcy Salvo 10 years ago and  knee operations twice two years ago.   SOCIAL HISTORY:  She does not smoke or drink. She is a Copy.   FAMILY HISTORY:  Mom is 37, has lumbar spondylosis. Dad is 4, in good  health; he has had a myocardial infarction.   REVIEW OF SYSTEMS:  Remarkable for glasses, nasal congestion, kidney stones,  leg pain, back pain, arthritis and  nasal allergies.   PHYSICAL EXAMINATION:  HEENT:  Within normal limits.  NECK:  Full range of motion.  CHEST:  Clear.  CARDIAC EXAM:  Regular rate and rhythm.  ABDOMEN:  Nontender, no hepatosplenomegaly.  EXTREMITIES:  Without clubbing, cyanosis or edema. Peripheral pulses are  good.  GU EXAM:  Deferred.  NEUROLOGIC:  She is awake, alert and oriented. Cranial nerves are intact.  Motor  exam shows 5/5 strength throughout the upper and lower extremities.  Sensory dysesthesias at right L4-5 distribution. Reflexes are 2 at the left  knee, 1 at the right, 1 at the ankles bilaterally. Straight leg raise is  negative.   MRI shows spondylitic changes of small disk at L1-2, arthritis at foraminal  disk at 4-5,  __________ elevation of right L4 dorsal root ganglion.   CLINICAL IMPRESSION:  Right L-4 radiculopathy related to disk.   PLAN:  For lumbar laminectomy, diskectomy. This disk appears it could be  excised from inside the canal by an extraforaminal approach.  The risks and  benefits of this procedure have been discussed with her and she wishes to  proceed.           ______________________________  Payton Doughty, M.D.     MWR/MEDQ  D:  07/01/2005  T:  07/01/2005  Job:  [id number]

## 2011-01-24 NOTE — Op Note (Signed)
Melissa Allen, Melissa Allen               ACCOUNT NO.:  [id number]   MEDICAL RECORD NO.:  [id number]          PATIENT TYPE:  AMB   LOCATION:  [facility]                         FACILITY:  [facility]   PHYSICIAN:  Maretta Bees. Vonita Moss, M.D.DATE OF BIRTH:  [birth year]   DATE OF PROCEDURE:  12/04/2005  DATE OF DISCHARGE:                                 OPERATIVE REPORT   PREOPERATIVE DIAGNOSIS:  Left ureterovesical junction stone.   POSTOP DIAGNOSIS:  Left ureterovesical junction stone.   PROCEDURE:  1.  Cystoscopy.  2.  Left ureteroscopy.  3.  Ureteroscopic stone basketing.   SURGEON:  Maretta Bees. Vonita Moss, M.D.   ANESTHESIA:  General.   INDICATIONS:  This 45 year old lady with history of stones has had problems  with left flank pain; and more recently, suprapubic pain and pressure due to  a 6 x 3 mm stone in the very distal left ureter.  She has been on Flomax and  pain medicines and wishes relief.  She also has a left renal stone, and an  asymptomatic right 3 mm upper ureteral stone on the right.   DESCRIPTION OF PROCEDURE:  The patient was brought to the operating room,  placed in the lithotomy position and external genitalia were prepped and  draped in the usual fashion.  She was cystoscoped but only after undergoing  a urethral dilation.  Bladder was unremarkable except for a golden yellow  stone protruding from the left ureteral orifice.  I used the grasping  forceps and removed the distal half of the stone and then inserted the  straight, rigid ureteroscope; and then with a nitinol stone basket, I  retrieved the other half of the stone.  I then reinserted the ureteroscope  up the left ureter without any problem, and identified no residual stone  fragments.  At this point, I reinserted the cystoscope removed the stone  fragments, and then emptied the bladder and sent the patient to the recovery  room in good condition having tolerated the procedure well.      Maretta Bees. Vonita Moss, M.D.  Electronically Signed     LJP/MEDQ  D:  12/04/2005  T:  12/05/2005  Job:  [id number]

## 2011-01-24 NOTE — Op Note (Signed)
Melissa Allen, Melissa Allen NO.:  [id number]   MEDICAL RECORD NO.:  [id number]          PATIENT TYPE:  OIB   LOCATION:  3010                         FACILITY:  [facility]   PHYSICIAN:  Payton Doughty, M.D.      DATE OF BIRTH:  May 09, 1966   DATE OF PROCEDURE:  07/01/2005  DATE OF DISCHARGE:                                 OPERATIVE REPORT   PREOPERATIVE DIAGNOSIS:  Foraminal disk on the right side at L4-5.   POSTOPERATIVE DIAGNOSIS:  Foraminal disk on the right side at L4-5.   OPERATIVE PROCEDURE:  Right L4-5 laminectomy and diskectomy.   SURGEON:  Payton Doughty, M.D.   ANESTHESIA:  General endotracheal.   PREPARATION:  Betadine prep and scrub with alcohol wipe.   COMPLICATIONS:  None.   BODY OF TEXT:  This is a 45 year old right-handed white girl who has a  foraminal disk on the right at L4-5.  Taken to the operating room and  smoothly anesthetized and intubated, placed prone on the operating table.  Following shave, prep and drape in the usual sterile fashion, the skin was  infiltrated with 1% lidocaine with 1:400,000 epinephrine.  The skin was  incised from mid-L5 to mid-L4.  The laminae of L4 and L5 were exposed on the  right side in a subperiosteal plane.  Intraoperative x-ray showed that we  were under the lamina of L5.  Hemisemilaminectomy of L4 was carried out to  the top of the ligamentum flavum, which was removed in a retrograde fashion.  The lateral margin in the disk of the thecal sac was identified and the  laminectomy defect extended that direction.  The origin of the L5 root and  the L4 root were identified as they entered the neural foramen.  There was a  disk with a foraminal component extending out into the right neural foramen.  The annular fibers were divided and this disk material was removed,  resulting in decompression of the L4 root.  Several small fragments were  milked from underneath the annulus.  Following complete decompression, the  neural  foramen was carefully inspected and found to be open.  The nerve  roots and neural foramina were covered with Depo-Medrol-soaked fat.  The  subcutaneous tissue and fascia were reapproximated with 0 Vicryl in  interrupted fashion.  The subcuticular tissue was reapproximated with 2-0  Vicryl in interrupted fashion.  The skin was closed with 4-0 Vicryl in  interrupted subcuticular fashion.  Benzoin and Steri-Strips were placed,  made occlusive with Telfa and OpSite, and the patient returned to the  recovery room in good condition.           ______________________________  Payton Doughty, M.D.     MWR/MEDQ  D:  07/01/2005  T:  07/02/2005  Job:  [id number]

## 2011-01-24 NOTE — Op Note (Signed)
[place]  Patient:    Melissa Allen, Melissa Allen                      MRN: [id number] Proc. Date: 01/03/00 Adm. Date:  [id number] Disc. Date: [id number] Attending:  Evlyn Clines                           Operative Report  PROCEDURES: 1. Cystoscopy, left retrograde pyelogram with interpretation. 2. Left ureteroscopy. 3. Insertion of left stent.  PREOPERATIVE DIAGNOSIS:  Left ureteropelvic junction stone.  POSTOPERATIVE DIAGNOSIS:  Left ureteropelvic junction stone with interval passage and ureteral stricture.  SURGEON:  Excell Seltzer. Annabell Howells, M.D.  ANESTHESIA:  General.  DRAINS:  6-French x 24 cm double-J stent.  COMPLICATIONS:  None.  INDICATIONS:  [name] is a 45 year old white female, who has had a left distal ureteral stone with significant irritative voiding symptoms and pain. She has elected ureteroscopic stone extraction.  Of note, she reports that she passed a couple of little, very small fragments last night, but she continues to have urinary urgency and pain in the left lower quadrant.  This was confirmed on examination preoperatively.  It was felt that the cystoscopy and retrograde with evaluation for possible ureteroscopy was indicated.  FINDINGS AND PROCEDURE:  The patient was taken to the operating room, where a general anesthetic was induced.  She was placed in the lithotomy position and genitalia were prepped with Betadine solution.  She was draped in usual sterile fashion.  Cystoscopy was performed using the 22-French scope and the 12 and 70 degree lens.  Examination revealed a little erythema of the left ureteral orifice, but was otherwise unremarkable.  A 5-French open-end catheter was inserted and contrast was instilled in a retrograde fashion. This revealed a delicate distal ureter.  On a couple of instances, there was the suggestion of a filling defect in the distal ureter, but it drained well. Because of this suggestion  of filling defect, it was felt that ureteroscopy was indicated to rule out a small retained stone fragment.  The 6-French ureteroscope was passed without difficulty without need for dilation.  This revealed initially what I thought was a small retained stone fragment. However on attempt to basket extract this lesions, which was covered with clot, it was noted to be just a small blood clot with some slough mucosa. However, proximal to this there was a moderate ureteral stricture.  In light of need for instrumentation in ureteral stricture, it was felt that stenting was indicated.  The cystoscope was replaced, the wire was advanced through the kidney and a 6-French/24 cm double-J stent with string was passed without difficulty and this will be left in for about 48 hours.  At this point, with removal of the wire, a good coil was noted in the kidney and the bladder.  The bladder was drained, the cystoscope was removed.  The stent string was tied and cut to the appropriate length.  The patient was taken down from lithotomy position and moved to recovery in stable condition.  There were no complications. DD:  01/03/00 TD:  01/03/00 Job: [id number]

## 2011-01-24 NOTE — Discharge Summary (Signed)
[place]  Patient:    Melissa Allen                       MRN: [id number] Adm. Date:  [id number] Disc. Date: 08/26/99 Attending:  Mervin Hack Dictator:   Dianah Field, P.A.                           Discharge Summary  ADMISSION DIAGNOSES:  1. Exacerbation of nausea, vomiting, abdominal pain.  2. Recent endoscopy showing bile reflux/alkaline gastritis.  3. Abdominal pain with low-grade fevers.  4. Dehydration secondary to protracted emesis.  5. 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  Periodic flares of these     symptoms.  6. Depression, anxiety.  7. Social emotional stressors, increased, contributing to exacerbation of her     gastrointestinal complaints.  8. Status post cholecystectomy.  9. History of nephrolithiasis versus ureterolithiasis, status post stent placement     with subsequent removal.  Rule out presenting symptoms secondary to     exacerbation of kidney stones.  Rule out urinary tract infection. 10. Status post childbirth x 3.  DISCHARGE DIAGNOSES:  1. Resolving exacerbation of gastrointestinal symptoms felt secondary to alkaline     gastritis.  2. Mild biliary ductal dilatation by CT imaging.  This may be normal     postcholecystectomy appearance versus pathological.  Liver function tests did     show a mild increase in total bilirubin.  Otherwise, these were within normal     limits.  3. Small hepatic and right renal cysts incidentally found on CT scan.  4. Dehydration, resolved.  CONSULTATIONS:  None.  PROCEDURES:  None.  LABORATORY DATA:  Amylase 117 and later measured 65, lipase 22.  Urinalysis showed normal specific gravity at 1.027 with no evidence for urine nitrites, leukocyte  esterase, blood, or urine bilirubin.  Urinary  ketones were increased at 80. CBC with hemoglobin of 15.7, hematocrit 44.8, and white blood cell count 11.5.  MCV  normal at 81.8 and platelets of 200.  Differential did show a left shift. Chemistries:  Sodium 138, potassium 3.6, glucose 97, BUN 14, creatinine 0.6. Bilirubin 1.1, alkaline phosphatase 63, AST 24, ALT 12.  IMAGING STUDIES:  CT scan of the abdomen and pelvis showing no evidence for pancreatic mass or pancreatitis.  Mild biliary ductal dilatation was noted, though no exact measurement given.  This made a normal postcholecystectomy appearance versus pathologic.  Small hepatic and right renal cyst noted.  In the pelvis, a  small amount of fluid in the cul-de-sac was noted.  This was probably physiologic. Otherwise, pelvic views within normal limits.  BRIEF HISTORY/HOSPITAL COURSE:  [name] is a 45 year old white female. She is a sixth grade teacher.  She has a long history of functional abdominal complaints, which have been severe at times, for which she has undergone multiple upper and  lower GI studies by Dr. Juanda Chance.  She has had CT scans, as well as ultrasounds, n the past.  She is status post cholecystectomy.  She has also been bothered by kidney stones and required stent placement in September 1999.  The patient has had recent flare of her upper GI symptoms with abdominal pain, nausea, and vomiting.  These symptoms had been quiescent  for the past few years; however, there have four major deaths of people close to her and one of these being her loyal companion dog.  These deaths have occurred in the last three to four months and include two grandparents, the dog, and an aunt.  She says that her dog was "a better friend than her husband."  These losses, as well as marital strife, have contributed to emotional flares and seemed to have triggered increase in her upper GI symptoms.  She was seen by Dr. Juanda Chance about 10 days ago and had upper endoscopy showing  bile reflux gastritis.  Carafate was added to her medical regimen, which had included Prilosec and Phenergan and Mepergan as needed.  Two  days after the endoscopy, she was seen at the [facility] Emergency Room for the same symptoms with the pain being described as quite severe.  Her pain finally subsided after Dilaudid, along with some Phenergan.  She did not find Phenergan or Demerol alone as helpful as the Dilaudid.  Other new prescriptions include Lorazepam.  The patient has been seeing a clinical Child psychotherapist for marital discord and emotional problems.  She has been set up with a date to see a psychiatrist to assess for initiation of appropriate antidepressant therapy. In the meantime, she has been treated with the anxiolytics.  On the day of admission, the patient was able to eat only small amounts and keep some liquids down, but at about 2:30 in the afternoon she developed severe nausea and was continually throwing up bilious emesis.  She had been throwing up on and off for the past several days and it had moderate level abdominal pain. Symptoms became so severe that she came to the emergency room, where she was evaluated by Dr. Lina Sar and admitted for further care.  Once hospitalized on nontelemetry bed, she continued on IV fluids started in the emergency room.  Multiple labs had been obtained and were not very revealing, though she did have a leukocytosis and increased hemoglobin, which is probably secondary to some volume contraction related to her excessive emesis and inability to take adequate p.o.s.  the patient had not had any diarrhea.  In fact, she had had less output of stool owing to decreased p.o. intake.  Abdominal pain was subjectively intense, though on exam she did not display any guarding or rebound.  Over the course of about three days of admission, the patient slowly improved, o the point where she was having some nausea but no further emesis.   The patients  bilirubin was slightly elevated and this, along with abdominal pain, prompted an abdominal pelvic CT.  This showed the ductal dilatation in the biliary ducts.  Although this is probably physiologic, it needs to be followed up.  No plans were made at present for ERCP studies.  After a couple of days, the patient was able to tolerate a diet consisting of soft foods.  IV fluids were slowly weaned and then discontinued.  Again, she was having some slight nausea on the day of discharge but was felt stable for return home.  She was to call the office for a followup office visit in about a weeks time with Dr. Juanda Chance.  DISCHARGE MEDICATIONS: 1. Prilosec 20 mg p.o. 1 to 2 daily. 2. Carafate 1 g p.o. a.c. and h.s. 3. Phenergan 25 mg p.o. q.4-6h. p.r.n. 4. Mepergan p.r.n. 5. Ambien p.r.n. for sleep. 6. Lorazepam of unclear dosage as needed. 7. Klonopin as needed. 8. Oral contraceptive  daily.  DIET:  Soft textured, bland foods until she was experiencing no nausea, at which point she could resume a regular diet.  ACTIVITIES:  As tolerated. DD:  08/26/99 TD:  08/28/99 Job: [id number]

## 2011-02-07 ENCOUNTER — Other Ambulatory Visit: Payer: Self-pay | Admitting: Family Medicine

## 2011-02-10 ENCOUNTER — Ambulatory Visit (INDEPENDENT_AMBULATORY_CARE_PROVIDER_SITE_OTHER): Payer: BC Managed Care – PPO | Admitting: Family Medicine

## 2011-02-10 ENCOUNTER — Encounter: Payer: Self-pay | Admitting: Family Medicine

## 2011-02-10 VITALS — BP 128/84 | HR 88 | Temp 98.6°F | Wt 182.2 lb

## 2011-02-10 DIAGNOSIS — J45909 Unspecified asthma, uncomplicated: Secondary | ICD-10-CM

## 2011-02-10 MED ORDER — ALBUTEROL SULFATE (2.5 MG/3ML) 0.083% IN NEBU
2.5000 mg | INHALATION_SOLUTION | Freq: Once | RESPIRATORY_TRACT | Status: AC
  Administered 2011-02-10: 2.5 mg via RESPIRATORY_TRACT

## 2011-02-10 MED ORDER — MONTELUKAST SODIUM 10 MG PO TABS: 10.0000 mg | ORAL_TABLET | Freq: Every day | ORAL | Status: DC

## 2011-02-10 MED ORDER — PREDNISONE 10 MG PO KIT: PACK | ORAL | Status: DC

## 2011-02-10 NOTE — Assessment & Plan Note (Addendum)
D/w pt re: options.  She doesn't appear to need ER eval.  Will start prednisone, uses SABA prn, start back on singular and advair (gargle after use).  If worse tonight, then to ER.  She agrees.  No need for abx at this point.  >25 min spent with face to face with patient >50% counseling. Fu with PMD after this episode is improved BJ:YNWG term options for asthma.

## 2011-02-10 NOTE — Progress Notes (Signed)
Asthma.  Off advair and singulair.  Normally with seasonal allergies, but not this time of year.  Sx started ~1 week ago, inc in cough and wheeze in last few days. Voice change noted.  No FCNAV.  R ear pain. Minimal sputum.  Some relief with SABA.  Used SABA 10-12 puffs today with some relief that was brief.  Nonsmoker.  Was prev in ER with wheeze/asthma 3/12.  Was on prednisone with that episode and was doing well after that episode.    Came in today with RR in mid 20s in [place].  "I don't feel as bad as I did when I went to the ER."  Prev notes reviewed.  Meds, vitals, and allergies reviewed.   ROS: See HPI.  Otherwise, noncontributory.  Initially with RR in mid 20s with mild inc in wob, but was conversant and not hypoxic/cyanotic ncat Mmm R tm with SOM noted Nasal and oral exam o/w unremarkable. Initially mildly tachy but regular Dec in air movement but clear Ext well perfused  After treatment, RR slowed, inc in air movement noted with less wob- and she was more comfortable

## 2011-02-10 NOTE — Patient Instructions (Signed)
Take the prednisone with food.  Start back on the advair twice a day, gargle with use.  Take the singulair at night.  Use the albuterol every 4 hours as needed.  Make an appointment with Dr. Dayton Martes to discuss your long term asthma meds.  Take care. If you get short of breath and you inhaler doesn't help, go to the ER or dial 911.

## 2011-02-17 ENCOUNTER — Other Ambulatory Visit: Payer: Self-pay | Admitting: Family Medicine

## 2011-03-19 ENCOUNTER — Other Ambulatory Visit: Payer: Self-pay | Admitting: Family Medicine

## 2011-04-09 ENCOUNTER — Other Ambulatory Visit: Payer: Self-pay | Admitting: Family Medicine

## 2011-04-11 ENCOUNTER — Encounter: Payer: Self-pay | Admitting: Family Medicine

## 2011-05-03 ENCOUNTER — Other Ambulatory Visit: Payer: Self-pay | Admitting: Family Medicine

## 2011-05-14 ENCOUNTER — Ambulatory Visit (INDEPENDENT_AMBULATORY_CARE_PROVIDER_SITE_OTHER): Payer: BC Managed Care – PPO | Admitting: Family Medicine

## 2011-05-14 ENCOUNTER — Encounter: Payer: Self-pay | Admitting: Family Medicine

## 2011-05-14 DIAGNOSIS — J0100 Acute maxillary sinusitis, unspecified: Secondary | ICD-10-CM

## 2011-05-14 MED ORDER — AMOXICILLIN 875 MG PO TABS: 875.0000 mg | ORAL_TABLET | Freq: Two times a day (BID) | ORAL | Status: AC

## 2011-05-14 MED ORDER — TRAMADOL HCL 50 MG PO TABS: 50.0000 mg | ORAL_TABLET | Freq: Four times a day (QID) | ORAL | Status: AC | PRN

## 2011-05-14 NOTE — Patient Instructions (Signed)
Take tramadol for pain as needed.  It can make you drowsy.  Start the amoxil this weekend if you aren't improved.  Continue with your other meds in the meantime.

## 2011-05-14 NOTE — Progress Notes (Signed)
R ear pain.  R facial and tooth pain.  Started with ST Saturday.  Some postnasal gtt.  She usually has trouble this time of year with ragweed.  Congested but no rhinorrhea.  Using nasonex and her regular asthma meds along with pseudophed.  Some cough now.  R ear pain continues.  Can't pop her R ear, "it feels stopped up."  Eyes watering.  Voice change noted.  No fevers known.    Meds, vitals, and allergies reviewed.   ROS: See HPI.  Otherwise, noncontributory.  GEN: nad, alert and oriented HEENT: mucous membranes moist, tm w/o erythema, nasal exam w/o erythema, clear discharge noted,  OP with cobblestoning NECK: supple w/o LA CV: rrr.   PULM: ctab, no inc wob EXT: no edema SKIN: no acute rash

## 2011-05-14 NOTE — Assessment & Plan Note (Signed)
Hold abx for now in case this is viral, tramadol for pain and then continue other routine meds.  Nontoxic and lungs clear.  F/u prn.  She agrees.

## 2011-05-19 ENCOUNTER — Encounter: Payer: Self-pay | Admitting: Family Medicine

## 2011-05-19 ENCOUNTER — Ambulatory Visit: Payer: BC Managed Care – PPO | Admitting: Family Medicine

## 2011-05-19 ENCOUNTER — Ambulatory Visit (INDEPENDENT_AMBULATORY_CARE_PROVIDER_SITE_OTHER): Payer: BC Managed Care – PPO | Admitting: Family Medicine

## 2011-05-19 VITALS — BP 132/90 | HR 97 | Temp 98.3°F | Wt 181.0 lb

## 2011-05-19 DIAGNOSIS — J45909 Unspecified asthma, uncomplicated: Secondary | ICD-10-CM

## 2011-05-19 MED ORDER — IPRATROPIUM BROMIDE 0.02 % IN SOLN
0.5000 mg | Freq: Once | RESPIRATORY_TRACT | Status: AC
  Administered 2012-01-12: 0.5 mg via RESPIRATORY_TRACT

## 2011-05-19 MED ORDER — ALBUTEROL SULFATE (2.5 MG/3ML) 0.083% IN NEBU
2.5000 mg | INHALATION_SOLUTION | Freq: Once | RESPIRATORY_TRACT | Status: AC
  Administered 2012-01-12: 2.5 mg via RESPIRATORY_TRACT

## 2011-05-19 MED ORDER — DEXAMETHASONE SODIUM PHOSPHATE 10 MG/ML IJ SOLN
10.0000 mg | Freq: Once | INTRAMUSCULAR | Status: AC
  Administered 2011-05-19: 10 mg via INTRAMUSCULAR

## 2011-05-19 NOTE — Progress Notes (Signed)
Asthma.  Off advair and singulair.  Normally with seasonal allergies, but not this time of year.   Saw Dr. Para March last week for sinus and ear pressure.  Given amoxicillin rx to fill if symptoms progress. Symptoms worsened to wheezing and cough that were not relieved with SABA. Taking Amoxicillin as well. No fevers or chills. Cough is dry.   The PMH, PSH, Social History, Family History, Medications, and allergies have been reviewed in [facility], and have been updated if relevant.   ROS: See HPI.  Otherwise, noncontributory.  Physical exam: BP 132/90  Pulse 97  Temp(Src) 98.3 F (36.8 C) (Oral)  Wt 181 lb (82.101 kg)  LMP 05/19/2011  Initially with RR in low to mid 20s with mild inc in wob, but was conversant and not hypoxic/cyanotic Nasal and oral exam o/w unremarkable. Initially mildly tachy but regular Dec in air movement but clear Ext well perfused  After treatment, RR slowed, inc in air movement noted with less wob- and she was more comfortable   Assessment and Plan: 1. ASTHMA   New. Decadron 10 mg IM x 1 given in office today along with duo neb. Pt symptoms improved. Finish course of amoxicillin and restart Singulair.

## 2011-06-10 LAB — DIFFERENTIAL
Basophils Absolute: 0.1
Eosinophils Absolute: 0
Eosinophils Relative: 1
Lymphs Abs: 1.7

## 2011-06-10 LAB — COMPREHENSIVE METABOLIC PANEL
ALT: 18
AST: 26
CO2: 24
Chloride: 106
GFR calc Af Amer: >60
GFR calc non Af Amer: >60
Sodium: 139
Total Bilirubin: 0.9

## 2011-06-10 LAB — POCT CARDIAC MARKERS
Myoglobin, poc: 26.5
Troponin i, poc: <0.05

## 2011-06-10 LAB — D-DIMER, QUANTITATIVE: D-Dimer, Quant: 2.92 — ABNORMAL HIGH

## 2011-06-10 LAB — CBC
RBC: 4.71
WBC: 7.6

## 2011-06-13 LAB — POCT PREGNANCY, URINE
Operator id: 114531
Preg Test, Ur: NEGATIVE

## 2011-06-29 ENCOUNTER — Other Ambulatory Visit: Payer: Self-pay | Admitting: Family Medicine

## 2011-06-30 ENCOUNTER — Other Ambulatory Visit: Payer: Self-pay | Admitting: Family Medicine

## 2011-07-28 ENCOUNTER — Ambulatory Visit (INDEPENDENT_AMBULATORY_CARE_PROVIDER_SITE_OTHER): Payer: BC Managed Care – PPO | Admitting: Obstetrics & Gynecology

## 2011-07-28 ENCOUNTER — Encounter: Payer: Self-pay | Admitting: Obstetrics & Gynecology

## 2011-07-28 VITALS — BP 104/69 | HR 106 | Ht 64.0 in | Wt 180.1 lb

## 2011-07-28 DIAGNOSIS — Z1272 Encounter for screening for malignant neoplasm of vagina: Secondary | ICD-10-CM

## 2011-07-28 DIAGNOSIS — Z01419 Encounter for gynecological examination (general) (routine) without abnormal findings: Secondary | ICD-10-CM

## 2011-07-28 DIAGNOSIS — Z113 Encounter for screening for infections with a predominantly sexual mode of transmission: Secondary | ICD-10-CM

## 2011-07-28 DIAGNOSIS — Z0000 Encounter for general adult medical examination without abnormal findings: Secondary | ICD-10-CM

## 2011-07-28 MED ORDER — NORGESTIMATE-ETH ESTRADIOL 0.25-35 MG-MCG PO TABS: 1.0000 | ORAL_TABLET | Freq: Every day | ORAL | Status: DC

## 2011-07-28 NOTE — Progress Notes (Signed)
Addended by: Allie Bossier on: 07/28/2011 03:54 PM   Modules accepted: Orders
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
## 2011-08-04 ENCOUNTER — Other Ambulatory Visit (INDEPENDENT_AMBULATORY_CARE_PROVIDER_SITE_OTHER): Payer: BC Managed Care – PPO

## 2011-08-04 DIAGNOSIS — Z13220 Encounter for screening for lipoid disorders: Secondary | ICD-10-CM

## 2011-08-04 LAB — CBC WITH DIFFERENTIAL/PLATELET
Basophils Relative: 1 % (ref 0–1)
HCT: 43.2 % (ref 36.0–46.0)
Hemoglobin: 13.9 g/dL (ref 12.0–15.0)
Lymphocytes Relative: 30 % (ref 12–46)
Lymphs Abs: 2.3 10*3/uL (ref 0.7–4.0)
MCHC: 32.2 g/dL (ref 30.0–36.0)
Monocytes Absolute: 0.6 10*3/uL (ref 0.1–1.0)
Monocytes Relative: 8 % (ref 3–12)
Neutro Abs: 4.6 10*3/uL (ref 1.7–7.7)
RBC: 4.81 MIL/uL (ref 3.87–5.11)

## 2011-08-04 LAB — COMPREHENSIVE METABOLIC PANEL
Albumin: 3.9 g/dL (ref 3.5–5.2)
BUN: 11 mg/dL (ref 6–23)
CO2: 26 mEq/L (ref 19–32)
Calcium: 9.4 mg/dL (ref 8.4–10.5)
Chloride: 102 mEq/L (ref 96–112)
Glucose, Bld: 88 mg/dL (ref 70–99)
Potassium: 4.6 mEq/L (ref 3.5–5.3)
Sodium: 138 mEq/L (ref 135–145)
Total Protein: 6.5 g/dL (ref 6.0–8.3)

## 2011-08-04 LAB — LIPID PANEL
LDL Cholesterol: 141 mg/dL — ABNORMAL HIGH (ref 0–99)
Triglycerides: 159 mg/dL — ABNORMAL HIGH (ref <150)

## 2011-08-04 NOTE — Progress Notes (Signed)
Patient is here today for her fasting lab work.

## 2011-08-11 ENCOUNTER — Encounter: Payer: Self-pay | Admitting: *Deleted

## 2011-08-26 ENCOUNTER — Ambulatory Visit (INDEPENDENT_AMBULATORY_CARE_PROVIDER_SITE_OTHER): Payer: BC Managed Care – PPO | Admitting: Family Medicine

## 2011-08-26 ENCOUNTER — Encounter: Payer: Self-pay | Admitting: *Deleted

## 2011-08-26 ENCOUNTER — Encounter: Payer: Self-pay | Admitting: Family Medicine

## 2011-08-26 VITALS — BP 110/82 | HR 95 | Temp 98.5°F | Wt 181.5 lb

## 2011-08-26 DIAGNOSIS — J40 Bronchitis, not specified as acute or chronic: Secondary | ICD-10-CM

## 2011-08-26 MED ORDER — AZITHROMYCIN 250 MG PO TABS: ORAL_TABLET | ORAL | Status: AC

## 2011-08-26 MED ORDER — BENZONATATE 200 MG PO CAPS: 200.0000 mg | ORAL_CAPSULE | Freq: Two times a day (BID) | ORAL | Status: AC | PRN

## 2011-08-26 NOTE — Patient Instructions (Signed)
Take Apack as directed.  Drink lots of fluids.  Treat sympotmatically with Mucinex, nasal saline irrigation, and Tylenol/Ibuprofen. Use your inhalers regularly.  Cough suppressant at night. Call if not improving as expected in 5-7 days.

## 2011-08-26 NOTE — Progress Notes (Signed)
SUBJECTIVE:  Melissa Allen is a 45 y.o. female who complains of congestion, productive cough and myalgias for 3 days. She denies a history of fevers, nausea, shortness of breath and weakness and admits to a history of asthma. Patient denies smoke cigarettes.  Used advair and proair inhaler shortly before coming in today.  Patient Active Problem List  Diagnoses  . GASTROENTERITIS, VIRAL  . ANXIETY  . DEPRESSION  . OTITIS MEDIA, SEROUS, RIGHT  . HYPERTENSION  . Acute Sinusitis, Unspecified  . ALLERGIC RHINITIS  . ASTHMA  . GERD  . REFLUX GASTRITIS  . HEPATIC CYST  . PANCREATITIS, CHRONIC  . RENAL CALCULUS  . CONTACT DERMATITIS&OTHER ECZEMA DUE TO PLANTS  . ARTHRITIS  . FIBROMYALGIA  . SYNCOPE  . DIZZINESS  . RASH-NONVESICULAR  . HEADACHE  . PALPITATIONS  . Unspecified chest pain  . NAUSEA  . DIARRHEA  . NEPHROLITHIASIS, HX OF  . STATUS ASTHMATICUS  . HYPERVENTILATION  . COUGH  . Sinusitis, acute maxillary   Past Medical History  Diagnosis Date  . Allergy   . Asthma   . GERD (gastroesophageal reflux disease)   . Calcaneal fracture    Past Surgical History  Procedure Date  . Tonsillectomy age 24  . Bunionectomy 1995    Right  . Cholecystectomy late 1980's       . Lumbar laminectomy 10/06    L4-5, Ray  . Knee surgery     x 2, Murphy/Sue  . Lithotripsy     Multiple  . R compressed pronator     Sypher   History  Substance Use Topics  . Smoking status: Never Smoker   . Smokeless tobacco: Never Used  . Alcohol Use: 3.5 oz/week    7 drink(s) per week   Family History  Problem Relation Age of Onset  . Heart disease Father   . Colon cancer Paternal Grandfather   . Colon cancer Maternal Grandmother   . Colon polyps Father   . Uterine cancer      Grandmother  . Breast cancer      Grandmother  . Ovarian cancer      Grandmother  . Diabetes      mother   Allergies  Allergen Reactions  . Codeine     REACTION: vomiting  . Hydrocodone   .  Hydrocodone-Acetaminophen    Current Outpatient Prescriptions on File Prior to Visit  Medication Sig Dispense Refill  . albuterol (PROAIR HFA) 108 (90 BASE) MCG/ACT inhaler Inhale 2 puffs into the lungs every 4 (four) hours as needed. Shortness of breath       . CYMBALTA 60 MG capsule TAKE ONE CAPSULE BY MOUTH NIGHTLY AT BEDTIME  30 each  2  . fluticasone (FLONASE) 50 MCG/ACT nasal spray 2 sprays by Nasal route daily.        . Fluticasone-Salmeterol (ADVAIR DISKUS) 250-50 MCG/DOSE AEPB Inhale 1 puff into the lungs every 12 (twelve) hours.        . gabapentin (NEURONTIN) 400 MG capsule TAKE ONE CAPSULE BY MOUTH TWICE DAILY  60 capsule  0  . hydrochlorothiazide (,MICROZIDE/HYDRODIURIL,) 12.5 MG capsule Take 12.5 mg by mouth daily.        Marland Kitchen lisinopril (PRINIVIL,ZESTRIL) 20 MG tablet Take 20 mg by mouth daily.        . montelukast (SINGULAIR) 10 MG tablet Take 1 tablet (10 mg total) by mouth at bedtime.  30 tablet  5  . nitroGLYCERIN (NITROSTAT) 0.4 MG SL tablet Place 0.4 mg under  the tongue. 1 tablet under tongue at onset of chest pain, you may repeat every 5 minutes for up to 3 doses.       . norgestimate-ethinyl estradiol (SPRINTEC 28) 0.25-35 MG-MCG tablet Take 1 tablet by mouth daily.  1 Package  12  . SUMAtriptan (IMITREX) 100 MG tablet Take 100 mg by mouth. 1 by mouth at the onset of migraine, May repeat dose in one hour if headache not resolved.       . traZODone (DESYREL) 100 MG tablet TAKE ONE TABLET BY MOUTH NIGHTLY AT BEDTIME  30 tablet  3  . triamterene-hydrochlorothiazide (MAXZIDE-25) 37.5-25 MG per tablet TAKE ONE TABLET BY MOUTH ONE TIME DAILY  30 tablet  6  . Vitamins-Lipotropics (B-100) TABS Take 1 tablet by mouth. OTC as directed        Current Facility-Administered Medications on File Prior to Visit  Medication Dose Route Frequency Provider Last Rate Last Dose  . albuterol (PROVENTIL) (2.5 MG/3ML) 0.083% nebulizer solution 2.5 mg  2.5 mg Nebulization Once [name], MD      .  ipratropium (ATROVENT) 0.02 % nebulizer solution 0.5 mg  0.5 mg Nebulization Once [name], MD       The PMH, PSH, Social History, Family History, Medications, and allergies have been reviewed in [facility], and have been updated if relevant.    OBJECTIVE: BP 110/82  Pulse 95  Temp(Src) 98.5 F (36.9 C) (Oral)  Wt 181 lb 8 oz (82.328 kg)  LMP 07/13/2011  She appears well, vital signs are as noted. Ears normal.  Throat and pharynx normal.  Neck supple. No adenopathy in the neck. Nose is congested. Sinuses non tender. The chest is clear, without wheezes or rales,dry constant cough.  ASSESSMENT:  Bronchospasm-lungs clear currently but given her history, probable bronchitis.  PLAN: Zpack. Symptomatic therapy suggested: Tessalon perles, push fluids, rest and return office visit prn if symptoms persist or worsen.Call or return to clinic prn if these symptoms worsen or fail to improve as anticipated.

## 2011-09-09 HISTORY — PX: TENDON REPAIR: SHX5111

## 2011-09-25 ENCOUNTER — Other Ambulatory Visit: Payer: Self-pay | Admitting: Family Medicine

## 2011-09-25 ENCOUNTER — Telehealth: Payer: Self-pay

## 2011-09-25 NOTE — Telephone Encounter (Signed)
PATIENT NEEDS A REFILL AUTHORIZATION ON HER [employer] BIRTH CONTROL SHE TAKES TWO PILLS INSTEAD OF ONE AND SHE IS OUT. SHE WANTS TO KNOW IS THERE ANY WAY SHE CAN HAVE IT RE WRITTEN SO SHE WONT RUN INTO THIS ISSUES EVERY TIME SHE NEEDS IT FILLED. HER PHARMACY IS [facility] [address] IN [place].

## 2011-10-10 ENCOUNTER — Other Ambulatory Visit: Payer: Self-pay | Admitting: Dermatology

## 2011-10-24 ENCOUNTER — Other Ambulatory Visit: Payer: Self-pay | Admitting: Family Medicine

## 2011-10-31 ENCOUNTER — Encounter (HOSPITAL_COMMUNITY): Payer: Self-pay

## 2011-10-31 ENCOUNTER — Emergency Department (HOSPITAL_COMMUNITY)
Admission: EM | Admit: 2011-10-31 | Discharge: 2011-10-31 | Disposition: A | Discharge status: Home / Self Care | Payer: BC Managed Care – PPO | Source: Home / Self Care | Attending: Emergency Medicine | Admitting: Emergency Medicine

## 2011-10-31 DIAGNOSIS — I10 Essential (primary) hypertension: Secondary | ICD-10-CM

## 2011-10-31 DIAGNOSIS — H8110 Benign paroxysmal vertigo, unspecified ear: Secondary | ICD-10-CM

## 2011-10-31 LAB — POCT I-STAT, CHEM 8
BUN: 10 mg/dL (ref 6–23)
Calcium, Ion: 1.1 mmol/L — ABNORMAL LOW (ref 1.12–1.32)
Chloride: 103 mEq/L (ref 96–112)
Creatinine, Ser: 0.8 mg/dL (ref 0.50–1.10)
Glucose, Bld: 101 mg/dL — ABNORMAL HIGH (ref 70–99)
HCT: 47 % — ABNORMAL HIGH (ref 36.0–46.0)
Hemoglobin: 16 g/dL — ABNORMAL HIGH (ref 12.0–15.0)
Potassium: 3.7 mEq/L (ref 3.5–5.1)
Sodium: 137 mEq/L (ref 135–145)
TCO2: 25 mmol/L (ref 0–100)

## 2011-10-31 MED ORDER — MECLIZINE HCL 25 MG PO TABS: 25.0000 mg | ORAL_TABLET | Freq: Four times a day (QID) | ORAL | Status: AC

## 2011-10-31 NOTE — ED Notes (Signed)
I don't feel like myself, feel weak, dizzy, nauseated, but no vomiting; denies pain, but has a "blood pressure headache", fells like a blood vessel problem in back of head

## 2011-10-31 NOTE — Discharge Instructions (Signed)
Vertigo Vertigo means you feel like you or your surroundings are moving when they are not. Vertigo can be dangerous if it occurs when you are at work, driving, or performing difficult activities.   CAUSES   Vertigo occurs when there is a conflict of signals sent to your brain from the visual and sensory systems in your body. There are many different causes of vertigo, including:  Infections, especially in the inner ear.     A bad reaction to a drug or misuse of alcohol and medicines.     Withdrawal from drugs or alcohol.     Rapidly changing positions, such as lying down or rolling over in bed.     A migraine headache.     Decreased blood flow to the brain.     Increased pressure in the brain from a head injury, infection, tumor, or bleeding.  SYMPTOMS   You may feel as though the world is spinning around or you are falling to the ground. Because your balance is upset, vertigo can cause nausea and vomiting. You may have involuntary eye movements (nystagmus). DIAGNOSIS   Vertigo is usually diagnosed by physical exam. If the cause of your vertigo is unknown, your caregiver may perform imaging tests, such as an MRI scan (magnetic resonance imaging). TREATMENT   Most cases of vertigo resolve on their own, without treatment. Depending on the cause, your caregiver may prescribe certain medicines. If your vertigo is related to body position issues, your caregiver may recommend movements or procedures to correct the problem. In rare cases, if your vertigo is caused by certain inner ear problems, you may need surgery. HOME CARE INSTRUCTIONS    Follow your caregiver's instructions.     Avoid driving.     Avoid operating heavy machinery.     Avoid performing any tasks that would be dangerous to you or others during a vertigo episode.     Tell your caregiver if you notice that certain medicines seem to be causing your vertigo. Some of the medicines used to treat vertigo episodes can actually  make them worse in some people.  SEEK IMMEDIATE MEDICAL CARE IF:    Your medicines do not relieve your vertigo or are making it worse.     You develop problems with talking, walking, weakness, or using your arms, hands, or legs.     You develop severe headaches.     Your nausea or vomiting continues or gets worse.     You develop visual changes.     A family member notices behavioral changes.     Your condition gets worse.  MAKE SURE YOU:  Understand these instructions.     Will watch your condition.     Will get help right away if you are not doing well or get worse.  Document Released: 06/04/2005 Document Revised: 05/07/2011 Document Reviewed: 03/13/2011 [employer] Patient Information 2012 [employer]. 

## 2011-10-31 NOTE — ED Provider Notes (Signed)
Chief Complaint  Patient presents with  . Hypertension    History of Present Illness:  Melissa Allen is in tonight because she thinks her blood pressure might be up. For the past 2 days she's felt dizzy, lightheaded, and had a floating feeling. She denies any whirling vertigo or presyncope. She's felt somewhat nauseous. She feels heavy headed but denies any headache. She went to the pharmacy and her blood pressure was found to be 160/98. A couple days ago at home it was 150/90. She denies any diplopia or blurred vision. She did have an episode of chest pain last night lasting about 45 minutes associated with palpitations. She denies any associated nausea, diaphoresis, or shortness of breath. She was recently hospitalized with chest pain and had a complete cardiac workup which was negative. She denies any shortness of breath or cough, but her chest does feel somewhat tight. She has an inhaler and has used this a couple times. She has a history of asthma. She denies any weakness, paresthesias, or difficulty with speech, swallowing, or ambulation except that her gait is somewhat unsteady.  Review of Systems:  Other than noted above, the patient denies any of the following symptoms: Systemic:  No fever, chills, fatigue, or weight loss. Eye:  No blurred vision, visual change or diplopia. ENT:  No ear pain, tinnitus, hearing loss, nasal congestion, or rhinorrhea. Cardiac:  No chest pain, dyspnea, palpitations or syncope. Neuro:  No headache, paresthesias, weakness, trouble with speech, coordination or ambulation.  PMFSH:  Past medical history, family history, social history, meds, and allergies were reviewed.  Physical Exam:   Vital signs:  BP 170/90  Pulse 96  Temp(Src) 98.1 F (36.7 C) (Oral)  Resp 17  SpO2 97% Filed Vitals:   10/31/11 1803 10/31/11 1924 10/31/11 1925 10/31/11 1926  BP: 147/94 160/80 170/100 170/90  Pulse: 97 85 86 96  Temp: 98.1 F (36.7 C)     TempSrc: Oral     Resp: 17       SpO2: 97%      General:  Alert, oriented times 3, in no distress. Eye:  PERRL, full EOM, no nystagmus. ENT:  TMs and canals normal.  Nasal mucosa normal.  Pharynx clear. Neck:  No adenopathy, tenderness, or mass.  Thyroid normal.  No carotid bruit. Lungs:  Breath sounds clear and equal bilaterally.  No wheezes, rales or rhonchi. Heart:  Regular rhythm.  No gallops, murmers, or rubs. Neuro:  Alert and oriented times 3.  Cranial nerves intact.  No pronator drift.  Finger to nose normal.  No focal weakness.  Sensation intact to light touch.  Romberg's sign negative, gait normal.  Able to do tandem gait, but with some difficulty.  Dix-Hallpike maneuver was positive bilaterally, but more so on the right than the left.  Labs:   Results for orders placed during the hospital encounter of 10/31/11  POCT I-STAT, CHEM 8      Component Value Range   Sodium 137  135 - 145 (mEq/L)   Potassium 3.7  3.5 - 5.1 (mEq/L)   Chloride 103  96 - 112 (mEq/L)   BUN 10  6 - 23 (mg/dL)   Creatinine, Ser 1.61  0.50 - 1.10 (mg/dL)   Glucose, Bld 096 (*) 70 - 99 (mg/dL)   Calcium, Ion 0.45 (*) 1.12 - 1.32 (mmol/L)   TCO2 25  0 - 100 (mmol/L)   Hemoglobin 16.0 (*) 12.0 - 15.0 (g/dL)   HCT 40.9 (*) 81.1 - 46.0 (%)  Date: 10/31/2011  Rate: 88  Rhythm: normal sinus rhythm  QRS Axis: normal  Intervals: normal  ST/T Wave abnormalities: normal  Conduction Disutrbances:none  Narrative Interpretation: Normal sinus rhythm, low voltage QRS, otherwise normal.  Old EKG Reviewed: none available    Assessment:   Diagnoses that have been ruled out:  None  Diagnoses that are still under consideration:  None  Final diagnoses:  Benign paroxysmal positional vertigo  HYPERTENSION    Plan:   1.  The following meds were prescribed:   New Prescriptions   MECLIZINE (ANTIVERT) 25 MG TABLET    Take 1 tablet (25 mg total) by mouth 4 (four) times daily.   2.  The patient was instructed in symptomatic care and handouts  were given. 3.  The patient was told to return if becoming worse in any way, if no better in 3 or 4 days, and given some red flag symptoms that would indicate earlier return. 4.  She was given some Epley exercises to do and I suggested she followup with her primary care physician in about a week with regard to her blood pressure.   Roque Lias, MD 10/31/11 2128

## 2011-11-03 ENCOUNTER — Ambulatory Visit (INDEPENDENT_AMBULATORY_CARE_PROVIDER_SITE_OTHER): Payer: BC Managed Care – PPO | Admitting: Family Medicine

## 2011-11-03 ENCOUNTER — Encounter: Payer: Self-pay | Admitting: Family Medicine

## 2011-11-03 VITALS — BP 120/90 | HR 88 | Temp 98.3°F | Wt 192.0 lb

## 2011-11-03 DIAGNOSIS — R42 Dizziness and giddiness: Secondary | ICD-10-CM | POA: Insufficient documentation

## 2011-11-03 NOTE — Progress Notes (Signed)
46 yo with h/o HTN and HLD here for UC follow up. Was seen at UC three days ago for elevated BP and feeling dizzy, lightheaded, and had a floating feeling. She denies any whirling vertigo or presyncope.  Notes reviewed. BP at UC as below:  Vital signs: BP 170/90  Pulse 96  Temp(Src) 98.1 F (36.7 C) (Oral)  Resp 17  SpO2 97%  Filed Vitals:    10/31/11 1803  10/31/11 1924  10/31/11 1925  10/31/11 1926   BP:  147/94  160/80  170/100  170/90   Pulse:  97  85  86  96   Temp:  98.1 F (36.7 C)      TempSrc:  Oral      Resp:  17      SpO2:  97%        Dix-Hallpike maneuver was positive bilaterally, but more so on the right than the left.  Labs:  Results for orders placed during the hospital encounter of 10/31/11   POCT I-STAT, CHEM 8   Component  Value  Range    Sodium  137  135 - 145 (mEq/L)    Potassium  3.7  3.5 - 5.1 (mEq/L)    Chloride  103  96 - 112 (mEq/L)    BUN  10  6 - 23 (mg/dL)    Creatinine, Ser  1.61  0.50 - 1.10 (mg/dL)    Glucose, Bld  096 (*)  70 - 99 (mg/dL)    Calcium, Ion  0.45 (*)  1.12 - 1.32 (mmol/L)    TCO2  25  0 - 100 (mmol/L)    Hemoglobin  16.0 (*)  12.0 - 15.0 (g/dL)    HCT  40.9 (*)  81.1 - 46.0 (%)   Date: 10/31/2011  Rate: 88  Rhythm: normal sinus rhythm  QRS Axis: normal  Intervals: normal  ST/T Wave abnormalities: normal  Conduction Disutrbances:none  Narrative Interpretation: Normal sinus rhythm, low voltage QRS, otherwise normal.   Normotensive in office today and feels a little better.  ROS: No CP, SOB or HA.  Physical exam: BP 120/90  Pulse 88  Temp(Src) 98.3 F (36.8 C) (Oral)  Wt 192 lb (87.091 kg)  General:  Well-developed,well-nourished,in no acute distress; alert,appropriate and cooperative throughout examination Head:  normocephalic and atraumatic.   Eyes:  vision grossly intact, pupils equal, pupils round, and pupils reactive to light.   Ears:  R ear normal and L ear normal.   Nose:  no external deformity.   Mouth:   good dentition.   Lungs:  Normal respiratory effort, chest expands symmetrically. Lungs are clear to auscultation, no crackles or wheezes. Heart:  Normal rate and regular rhythm. S1 and S2 normal without gallop, murmur, click, rub or other extra sounds. Msk:  No deformity or scoliosis noted of thoracic or lumbar spine.   Extremities:  No clubbing, cyanosis, edema, or deformity noted with normal full range of motion of all joints.   Neurologic:  alert & oriented X3 and gait normal.   Skin:  Intact without suspicious lesions or rashes Psych:  Cognition and judgment appear intact. Alert and cooperative with normal attention span and concentration. No apparent delusions, illusions, hallucinations  Assessment and Plan: 1. Vertigo    New and now normotensive. Advised calling me in next couple to three days with an update of her symptoms, sooner if she feels worse. The patient indicates understanding of these issues and agrees with the plan.

## 2011-11-17 ENCOUNTER — Other Ambulatory Visit: Payer: Self-pay | Admitting: Family Medicine

## 2011-11-24 ENCOUNTER — Other Ambulatory Visit: Payer: Self-pay | Admitting: Family Medicine

## 2011-12-08 ENCOUNTER — Other Ambulatory Visit: Payer: Self-pay | Admitting: Dermatology

## 2011-12-12 ENCOUNTER — Other Ambulatory Visit: Payer: Self-pay | Admitting: Family Medicine

## 2011-12-15 ENCOUNTER — Ambulatory Visit (INDEPENDENT_AMBULATORY_CARE_PROVIDER_SITE_OTHER): Payer: BC Managed Care – PPO | Admitting: Obstetrics & Gynecology

## 2011-12-15 ENCOUNTER — Encounter: Payer: Self-pay | Admitting: Obstetrics & Gynecology

## 2011-12-15 VITALS — BP 148/96 | HR 101 | Ht 64.0 in | Wt 185.0 lb

## 2011-12-15 DIAGNOSIS — N938 Other specified abnormal uterine and vaginal bleeding: Secondary | ICD-10-CM

## 2011-12-15 DIAGNOSIS — N939 Abnormal uterine and vaginal bleeding, unspecified: Secondary | ICD-10-CM

## 2011-12-15 DIAGNOSIS — N926 Irregular menstruation, unspecified: Secondary | ICD-10-CM

## 2011-12-15 NOTE — Progress Notes (Signed)
Patient is here today for irregular bleeding and cramps even when her cycle is not on.  Last cycle lasted 10 days heavy.

## 2011-12-15 NOTE — Progress Notes (Signed)
  Subjective:    Patient ID: Melissa Allen, female    DOB: 1966/08/14, 46 y.o.   MRN: [id number]  HPI  Melissa Allen is a 46 yo lady who was here for her annual exam 11/12.  At that time she started taking her OCPs in a continuous fashion. She is here today because she had an episode of heavy bleeding about 2 weeks ago. She also complains of fairly bad cramps.  Review of Systems   2 weeks post op status post right ankle surgery Objective:   Physical Exam  Normal appearance of cervix Uterus tender, somewhat boggy and tender      Assessment & Plan:  DUB- I will check cervical cultures and a pelvic u/s. I have advised her to finish this pill pack in a non-continuous fashion.

## 2011-12-16 LAB — GC/CHLAMYDIA PROBE AMP, URINE: Chlamydia, Swab/Urine, PCR: NEGATIVE

## 2011-12-19 ENCOUNTER — Other Ambulatory Visit: Payer: Self-pay | Admitting: Obstetrics & Gynecology

## 2011-12-19 ENCOUNTER — Ambulatory Visit (HOSPITAL_COMMUNITY)
Admission: RE | Admit: 2011-12-19 | Discharge: 2011-12-19 | Disposition: A | Discharge status: Home / Self Care | Payer: BC Managed Care – PPO | Source: Ambulatory Visit | Attending: Obstetrics & Gynecology | Admitting: Obstetrics & Gynecology

## 2011-12-19 DIAGNOSIS — D251 Intramural leiomyoma of uterus: Secondary | ICD-10-CM | POA: Insufficient documentation

## 2011-12-19 DIAGNOSIS — N949 Unspecified condition associated with female genital organs and menstrual cycle: Secondary | ICD-10-CM | POA: Insufficient documentation

## 2011-12-19 DIAGNOSIS — D252 Subserosal leiomyoma of uterus: Secondary | ICD-10-CM | POA: Insufficient documentation

## 2011-12-19 DIAGNOSIS — N920 Excessive and frequent menstruation with regular cycle: Secondary | ICD-10-CM | POA: Insufficient documentation

## 2011-12-19 DIAGNOSIS — N938 Other specified abnormal uterine and vaginal bleeding: Secondary | ICD-10-CM

## 2011-12-25 ENCOUNTER — Ambulatory Visit (INDEPENDENT_AMBULATORY_CARE_PROVIDER_SITE_OTHER): Payer: BC Managed Care – PPO | Admitting: Obstetrics & Gynecology

## 2011-12-25 ENCOUNTER — Encounter: Payer: Self-pay | Admitting: Obstetrics & Gynecology

## 2011-12-25 VITALS — BP 141/91 | HR 121 | Ht 64.0 in | Wt 185.0 lb

## 2011-12-25 DIAGNOSIS — D219 Benign neoplasm of connective and other soft tissue, unspecified: Secondary | ICD-10-CM

## 2011-12-25 DIAGNOSIS — D259 Leiomyoma of uterus, unspecified: Secondary | ICD-10-CM

## 2011-12-25 DIAGNOSIS — R32 Unspecified urinary incontinence: Secondary | ICD-10-CM

## 2011-12-25 DIAGNOSIS — N946 Dysmenorrhea, unspecified: Secondary | ICD-10-CM

## 2011-12-25 NOTE — Progress Notes (Signed)
  Subjective:    Patient ID: Melissa Allen, female    DOB: 04-15-1966, 45 y.o.   MRN: [id number]  HPI  Melissa Allen was seen here for her annual exam recently. At that time her uterus was enlarged and she was complaining of severe cramping with and without her period and an occasion of DUB. I ordered an u/s and it showed a 4 cm fibroid. She would like to have her uterus removed. She also has mixed urinary incontinece. 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  Review of Systems     Objective:   Physical Exam        Assessment & Plan:  As above. Plan RATH/TVT/cystoscopy

## 2012-01-05 ENCOUNTER — Other Ambulatory Visit: Payer: Self-pay | Admitting: Family Medicine

## 2012-01-11 ENCOUNTER — Emergency Department (HOSPITAL_COMMUNITY)
Admission: EM | Admit: 2012-01-11 | Discharge: 2012-01-11 | Disposition: A | Discharge status: Home / Self Care | Payer: BC Managed Care – PPO | Attending: Emergency Medicine | Admitting: Emergency Medicine

## 2012-01-11 ENCOUNTER — Other Ambulatory Visit: Payer: Self-pay

## 2012-01-11 ENCOUNTER — Encounter (HOSPITAL_COMMUNITY): Payer: Self-pay | Admitting: *Deleted

## 2012-01-11 ENCOUNTER — Emergency Department (HOSPITAL_COMMUNITY): Payer: BC Managed Care – PPO

## 2012-01-11 DIAGNOSIS — I498 Other specified cardiac arrhythmias: Secondary | ICD-10-CM | POA: Insufficient documentation

## 2012-01-11 DIAGNOSIS — R110 Nausea: Secondary | ICD-10-CM | POA: Insufficient documentation

## 2012-01-11 DIAGNOSIS — R0602 Shortness of breath: Secondary | ICD-10-CM | POA: Insufficient documentation

## 2012-01-11 DIAGNOSIS — J45909 Unspecified asthma, uncomplicated: Secondary | ICD-10-CM | POA: Insufficient documentation

## 2012-01-11 MED ORDER — ONDANSETRON 4 MG PO TBDP
8.0000 mg | ORAL_TABLET | Freq: Once | ORAL | Status: AC
  Administered 2012-01-11: 8 mg via ORAL
  Filled 2012-01-11: qty 2

## 2012-01-11 MED ORDER — ALBUTEROL SULFATE (5 MG/ML) 0.5% IN NEBU
INHALATION_SOLUTION | RESPIRATORY_TRACT | Status: AC
  Administered 2012-01-11: 5 mg
  Filled 2012-01-11: qty 1

## 2012-01-11 MED ORDER — ALBUTEROL SULFATE (5 MG/ML) 0.5% IN NEBU
5.0000 mg | INHALATION_SOLUTION | Freq: Once | RESPIRATORY_TRACT | Status: AC
  Administered 2012-01-11: 5 mg via RESPIRATORY_TRACT
  Filled 2012-01-11: qty 1

## 2012-01-11 MED ORDER — PROMETHAZINE HCL 25 MG PO TABS: 25.0000 mg | ORAL_TABLET | Freq: Four times a day (QID) | ORAL | Status: DC | PRN

## 2012-01-11 MED ORDER — OXYCODONE-ACETAMINOPHEN 5-325 MG PO TABS
1.0000 | ORAL_TABLET | Freq: Once | ORAL | Status: AC
  Administered 2012-01-11: 1 via ORAL
  Filled 2012-01-11: qty 1

## 2012-01-11 MED ORDER — PROMETHAZINE HCL 25 MG PO TABS
25.0000 mg | ORAL_TABLET | Freq: Once | ORAL | Status: AC
  Administered 2012-01-11: 25 mg via ORAL
  Filled 2012-01-11: qty 1

## 2012-01-11 MED ORDER — PREDNISONE 20 MG PO TABS
60.0000 mg | ORAL_TABLET | Freq: Once | ORAL | Status: AC
  Administered 2012-01-11: 60 mg via ORAL
  Filled 2012-01-11: qty 3

## 2012-01-11 MED ORDER — IPRATROPIUM BROMIDE 0.02 % IN SOLN
RESPIRATORY_TRACT | Status: AC
  Administered 2012-01-11: 0.2 mg
  Filled 2012-01-11: qty 2.5

## 2012-01-11 MED ORDER — ONDANSETRON HCL 4 MG/2ML IJ SOLN
4.0000 mg | Freq: Once | INTRAMUSCULAR | Status: AC
  Administered 2012-01-11: 4 mg via INTRAVENOUS
  Filled 2012-01-11: qty 2

## 2012-01-11 MED ORDER — SODIUM CHLORIDE 0.9 % IV BOLUS (SEPSIS)
1000.0000 mL | Freq: Once | INTRAVENOUS | Status: AC
  Administered 2012-01-11: 1000 mL via INTRAVENOUS

## 2012-01-11 MED ORDER — MORPHINE SULFATE 4 MG/ML IJ SOLN
4.0000 mg | Freq: Once | INTRAMUSCULAR | Status: AC
  Administered 2012-01-11: 4 mg via INTRAVENOUS
  Filled 2012-01-11: qty 1

## 2012-01-11 MED ORDER — PREDNISONE 20 MG PO TABS: 40.0000 mg | ORAL_TABLET | Freq: Every day | ORAL | Status: DC

## 2012-01-11 NOTE — ED Notes (Signed)
Having cough, sob, asthma, no relief with inhalers, unable to answer questions at triage, HR 150 at triage.

## 2012-01-11 NOTE — ED Provider Notes (Signed)
History     CSN: [id number]  Arrival date & time 01/11/12  0719   First MD Initiated Contact with Patient 01/11/12 [phone]      Chief Complaint  Patient presents with  . Asthma  . Shortness of Breath     Patient is a 46 y.o. female presenting with asthma. The history is provided by the patient.  Asthma This is a recurrent problem. The current episode started 2 days ago. The problem occurs hourly. The problem has been rapidly worsening. Pertinent negatives include no chest pain. The symptoms are aggravated by exertion. The symptoms are relieved by rest. Treatments tried: albuterol. The treatment provided no relief.  Patient with cough/wheeze/shortness of breath for past 2 days.  She reports it is not improving.  She reports h/o asthma.  She denies h/o intubation.  No CP is reported.    Past Medical History  Diagnosis Date  . Allergy   . Asthma   . GERD (gastroesophageal reflux disease)   . Calcaneal fracture   . Obesity (BMI 30.0-34.9)     Past Surgical History  Procedure Date  . Tonsillectomy age 71  . Bunionectomy 1995    Right  . Cholecystectomy late 1980's       . Lumbar laminectomy 10/06    L4-5, Ray  . Knee surgery     x 2, Murphy/Sue  . Lithotripsy     Multiple  . R compressed pronator     Sypher  . Tendon repair 2013    Family History  Problem Relation Age of Onset  . Heart disease Father   . Colon cancer Paternal Grandfather   . Colon cancer Maternal Grandmother   . Colon polyps Father   . Uterine cancer      Grandmother  . Breast cancer      Grandmother  . Ovarian cancer      Grandmother  . Diabetes      mother    History  Substance Use Topics  . Smoking status: Never Smoker   . Smokeless tobacco: Never Used  . Alcohol Use: 3.5 oz/week    7 drink(s) per week    OB History    Grav Para Term Preterm Abortions TAB SAB Ect Mult Living                  Review of Systems  Constitutional: Negative for fever.  Respiratory: Positive for cough  and wheezing.   Cardiovascular: Negative for chest pain.  Psychiatric/Behavioral: Negative for confusion.  All other systems reviewed and are negative.    Allergies  Codeine; Hydrocodone; Hydrocodone-acetaminophen; and Latex  Home Medications   Current Outpatient Rx  Name Route Sig Dispense Refill  . ALBUTEROL SULFATE HFA 108 (90 BASE) MCG/ACT IN AERS Inhalation Inhale 2 puffs into the lungs every 4 (four) hours as needed. Shortness of breath     . CYMBALTA 60 MG PO CPEP  TAKE ONE CAPSULE BY MOUTH NIGHTLY AT BEDTIME 30 each 6  . FLUTICASONE PROPIONATE 50 MCG/ACT NA SUSP Nasal 2 sprays by Nasal route daily.      Marland Kitchen FLUTICASONE-SALMETEROL 250-50 MCG/DOSE IN AEPB Inhalation Inhale 1 puff into the lungs every 12 (twelve) hours.      Marland Kitchen GABAPENTIN 400 MG PO CAPS  TAKE ONE CAPSULE BY MOUTH TWICE DAILY 60 capsule 2  . MONTELUKAST SODIUM 10 MG PO TABS Oral Take 1 tablet (10 mg total) by mouth at bedtime. 30 tablet 5  . NORGESTIMATE-ETH ESTRADIOL 0.25-35 MG-MCG PO TABS  Oral Take 1 tablet by mouth daily. 1 Package 12  . TRAZODONE HCL 100 MG PO TABS  TAKE ONE TABLET BY MOUTH NIGHTLY AT BEDTIME 30 tablet 2  . TRIAMTERENE-HCTZ 37.5-25 MG PO TABS  TAKE ONE TABLET BY MOUTH ONE TIME DAILY 30 tablet 5  . B-100 PO TABS Oral Take 1 tablet by mouth daily.     Marland Kitchen NITROGLYCERIN 0.4 MG SL SUBL Sublingual Place 0.4 mg under the tongue. 1 tablet under tongue at onset of chest pain, you may repeat every 5 minutes for up to 3 doses.       BP 148/111  Pulse 149  Temp(Src) 99 F (37.2 C) (Oral)  Resp 24  SpO2 98%  LMP 12/21/2011  Physical Exam CONSTITUTIONAL: Well developed/well nourished HEAD AND FACE: Normocephalic/atraumatic EYES: EOMI/PERRL ENMT: Mucous membranes moist NECK: supple no meningeal signs SPINE:entire spine nontender CV: S1/S2 noted, no murmurs/rubs/gallops noted LUNGS: tachypneic, wheezing noted, poor air entry but she is able to speak to me ABDOMEN: soft, nontender, no rebound or  guarding GU:no cva tenderness NEURO: Pt is awake/alert, moves all extremitiesx4 EXTREMITIES: pulses normal, full ROM, no edema SKIN: warm, color normal PSYCH: no abnormalities of mood noted  ED Course  Procedures  7:58 AM Will treat with nebs and reassess.  Will follow closely 8:03 AM Pt improved, improved air entry but still with wheeze Will give another treatment 9:22 AM Her breathing has improved, able to ambulate without difficulty but now feeling nausea due to pain meds/prednisone and she is vomiting.  No pain complaints    MDM  Nursing notes reviewed and considered in documentation xrays reviewed and considered Previous records reviewed and considered         Date: 01/11/2012  Rate: 113  Rhythm: sinus tachycardia  QRS Axis: right  Intervals: normal  ST/T Wave abnormalities: nonspecific ST changes  Conduction Disutrbances:none  Narrative Interpretation:   Old EKG Reviewed: unchanged    Joya Gaskins, MD 01/12/12 [phone]

## 2012-01-11 NOTE — ED Provider Notes (Signed)
History     CSN: [id number]  Arrival date & time 01/11/12  0719   First MD Initiated Contact with Patient 01/11/12 [phone]      Chief Complaint  Patient presents with  . Asthma  . Shortness of Breath    (Consider location/radiation/quality/duration/timing/severity/associated sxs/prior treatment) HPI  Past Medical History  Diagnosis Date  . Allergy   . Asthma   . GERD (gastroesophageal reflux disease)   . Calcaneal fracture   . Obesity (BMI 30.0-34.9)     Past Surgical History  Procedure Date  . Tonsillectomy age 27  . Bunionectomy 1995    Right  . Cholecystectomy late 1980's       . Lumbar laminectomy 10/06    L4-5, Ray  . Knee surgery     x 2, Murphy/Sue  . Lithotripsy     Multiple  . R compressed pronator     Sypher  . Tendon repair 2013    Family History  Problem Relation Age of Onset  . Heart disease Father   . Colon cancer Paternal Grandfather   . Colon cancer Maternal Grandmother   . Colon polyps Father   . Uterine cancer      Grandmother  . Breast cancer      Grandmother  . Ovarian cancer      Grandmother  . Diabetes      mother    History  Substance Use Topics  . Smoking status: Never Smoker   . Smokeless tobacco: Never Used  . Alcohol Use: 3.5 oz/week    7 drink(s) per week    OB History    Grav Para Term Preterm Abortions TAB SAB Ect Mult Living                  Review of Systems  Allergies  Codeine; Hydrocodone; Hydrocodone-acetaminophen; and Latex  Home Medications   Current Outpatient Rx  Name Route Sig Dispense Refill  . ALBUTEROL SULFATE HFA 108 (90 BASE) MCG/ACT IN AERS Inhalation Inhale 2 puffs into the lungs every 4 (four) hours as needed. Shortness of breath     . CYMBALTA 60 MG PO CPEP  TAKE ONE CAPSULE BY MOUTH NIGHTLY AT BEDTIME 30 each 6  . FLUTICASONE PROPIONATE 50 MCG/ACT NA SUSP Nasal 2 sprays by Nasal route daily.      Marland Kitchen FLUTICASONE-SALMETEROL 250-50 MCG/DOSE IN AEPB Inhalation Inhale 1 puff into the lungs  every 12 (twelve) hours.      Marland Kitchen GABAPENTIN 400 MG PO CAPS  TAKE ONE CAPSULE BY MOUTH TWICE DAILY 60 capsule 2  . MONTELUKAST SODIUM 10 MG PO TABS Oral Take 1 tablet (10 mg total) by mouth at bedtime. 30 tablet 5  . NORGESTIMATE-ETH ESTRADIOL 0.25-35 MG-MCG PO TABS Oral Take 1 tablet by mouth daily. 1 Package 12  . TRAZODONE HCL 100 MG PO TABS  TAKE ONE TABLET BY MOUTH NIGHTLY AT BEDTIME 30 tablet 2  . TRIAMTERENE-HCTZ 37.5-25 MG PO TABS  TAKE ONE TABLET BY MOUTH ONE TIME DAILY 30 tablet 5  . B-100 PO TABS Oral Take 1 tablet by mouth daily.     Marland Kitchen NITROGLYCERIN 0.4 MG SL SUBL Sublingual Place 0.4 mg under the tongue. 1 tablet under tongue at onset of chest pain, you may repeat every 5 minutes for up to 3 doses.     Marland Kitchen PREDNISONE 20 MG PO TABS Oral Take 2 tablets (40 mg total) by mouth daily. 10 tablet 0  . PROMETHAZINE HCL 25 MG PO TABS Oral  Take 1 tablet (25 mg total) by mouth every 6 (six) hours as needed for nausea. 10 tablet 0    BP 120/57  Pulse 118  Temp(Src) 99 F (37.2 C) (Oral)  Resp 17  SpO2 95%  LMP 12/21/2011  Physical Exam  ED Course  Procedures (including critical care time)  Labs Reviewed - No data to display Dg Chest Port 1 View  01/11/2012  *RADIOLOGY REPORT*  Clinical Data: Shortness of breath  PORTABLE CHEST - 1 VIEW  Comparison: November 14, 2010  Findings: The cardiac silhouette, mediastinum, pulmonary vasculature are within normal limits.  Both lungs are clear. There is no acute bony abnormality.  IMPRESSION: There is no evidence of acute cardiac or pulmonary process.  Original Report Authenticated By: Brandon Melnick, M.D.     1. Asthma   2. Nausea       MDM  Pt if feeling better and tolerating po:pt is okay to go home:pt not longer wheezing:will send home with steriods        Teressa Lower, NP 01/11/12 1308

## 2012-01-12 ENCOUNTER — Encounter: Payer: Self-pay | Admitting: Family Medicine

## 2012-01-12 ENCOUNTER — Ambulatory Visit (INDEPENDENT_AMBULATORY_CARE_PROVIDER_SITE_OTHER): Payer: BC Managed Care – PPO | Admitting: Family Medicine

## 2012-01-12 VITALS — BP 120/84 | HR 126 | Temp 98.8°F | Wt 189.0 lb

## 2012-01-12 DIAGNOSIS — J45909 Unspecified asthma, uncomplicated: Secondary | ICD-10-CM

## 2012-01-12 NOTE — Progress Notes (Signed)
46 yo with h/o asthma here for ER follow up.  Notes reviewed.  Went to ER yesterday for SOB/Asthma exacerbation.  Taking her advair and singulair but this year has been a bad allergy season for her. Yesterday, dry cough got so bad, she couldn't catch her breath. CXR- neg Given po prednisone and nebs treatment. Advised to follow up here today.  Was feeling better but this morning, having a terrible dry coughing spell. Afebrile. No CP. Patient Active Problem List  Diagnoses  . GASTROENTERITIS, VIRAL  . ANXIETY  . DEPRESSION  . OTITIS MEDIA, SEROUS, RIGHT  . HYPERTENSION  . Acute Sinusitis, Unspecified  . ALLERGIC RHINITIS  . ASTHMA  . GERD  . REFLUX GASTRITIS  . HEPATIC CYST  . PANCREATITIS, CHRONIC  . RENAL CALCULUS  . CONTACT DERMATITIS&OTHER ECZEMA DUE TO PLANTS  . ARTHRITIS  . FIBROMYALGIA  . SYNCOPE  . DIZZINESS  . RASH-NONVESICULAR  . HEADACHE  . PALPITATIONS  . Unspecified chest pain  . NAUSEA  . DIARRHEA  . NEPHROLITHIASIS, HX OF  . STATUS ASTHMATICUS  . HYPERVENTILATION  . COUGH  . Sinusitis, acute maxillary  . Vertigo   Past Medical History  Diagnosis Date  . Allergy   . Asthma   . GERD (gastroesophageal reflux disease)   . Calcaneal fracture   . Obesity (BMI 30.0-34.9)    Past Surgical History  Procedure Date  . Tonsillectomy age 65  . Bunionectomy 1995    Right  . Cholecystectomy late 1980's       . Lumbar laminectomy 10/06    L4-5, Ray  . Knee surgery     x 2, Murphy/Sue  . Lithotripsy     Multiple  . R compressed pronator     Sypher  . Tendon repair 2013   History  Substance Use Topics  . Smoking status: Never Smoker   . Smokeless tobacco: Never Used  . Alcohol Use: 3.5 oz/week    7 drink(s) per week   Family History  Problem Relation Age of Onset  . Heart disease Father   . Colon cancer Paternal Grandfather   . Colon cancer Maternal Grandmother   . Colon polyps Father   . Uterine cancer      Grandmother  . Breast cancer       Grandmother  . Ovarian cancer      Grandmother  . Diabetes      mother   Allergies  Allergen Reactions  . Codeine     REACTION: vomiting  . Hydrocodone   . Hydrocodone-Acetaminophen   . Latex Hives   Current Outpatient Prescriptions on File Prior to Visit  Medication Sig Dispense Refill  . albuterol (PROAIR HFA) 108 (90 BASE) MCG/ACT inhaler Inhale 2 puffs into the lungs every 4 (four) hours as needed. Shortness of breath       . CYMBALTA 60 MG capsule TAKE ONE CAPSULE BY MOUTH NIGHTLY AT BEDTIME  30 each  6  . fluticasone (FLONASE) 50 MCG/ACT nasal spray 2 sprays by Nasal route daily.        . Fluticasone-Salmeterol (ADVAIR DISKUS) 250-50 MCG/DOSE AEPB Inhale 1 puff into the lungs every 12 (twelve) hours.        . gabapentin (NEURONTIN) 400 MG capsule TAKE ONE CAPSULE BY MOUTH TWICE DAILY  60 capsule  2  . montelukast (SINGULAIR) 10 MG tablet Take 1 tablet (10 mg total) by mouth at bedtime.  30 tablet  5  . nitroGLYCERIN (NITROSTAT) 0.4 MG SL tablet  Place 0.4 mg under the tongue. 1 tablet under tongue at onset of chest pain, you may repeat every 5 minutes for up to 3 doses.       . norgestimate-ethinyl estradiol (SPRINTEC 28) 0.25-35 MG-MCG tablet Take 1 tablet by mouth daily.  1 Package  12  . predniSONE (DELTASONE) 20 MG tablet Take 2 tablets (40 mg total) by mouth daily.  10 tablet  0  . promethazine (PHENERGAN) 25 MG tablet Take 1 tablet (25 mg total) by mouth every 6 (six) hours as needed for nausea.  10 tablet  0  . traZODone (DESYREL) 100 MG tablet TAKE ONE TABLET BY MOUTH NIGHTLY AT BEDTIME  30 tablet  2  . triamterene-hydrochlorothiazide (MAXZIDE-25) 37.5-25 MG per tablet TAKE ONE TABLET BY MOUTH ONE TIME DAILY  30 tablet  5  . Vitamins-Lipotropics (B-100) TABS Take 1 tablet by mouth daily.        Current Facility-Administered Medications on File Prior to Visit  Medication Dose Route Frequency Provider Last Rate Last Dose  . albuterol (PROVENTIL) (2.5 MG/3ML) 0.083%  nebulizer solution 2.5 mg  2.5 mg Nebulization Once Dianne Dun, MD      . ipratropium (ATROVENT) 0.02 % nebulizer solution 0.5 mg  0.5 mg Nebulization Once Dianne Dun, MD      . morphine 4 MG/ML injection 4 mg  4 mg Intravenous Once Joya Gaskins, MD   4 mg at 01/11/12 1024  . ondansetron (ZOFRAN) injection 4 mg  4 mg Intravenous Once Joya Gaskins, MD   4 mg at 01/11/12 0945  . ondansetron (ZOFRAN-ODT) disintegrating tablet 8 mg  8 mg Oral Once Joya Gaskins, MD   8 mg at 01/11/12 1047  . promethazine (PHENERGAN) tablet 25 mg  25 mg Oral Once Teressa Lower, NP   25 mg at 01/11/12 1344  . sodium chloride 0.9 % bolus 1,000 mL  1,000 mL Intravenous Once Joya Gaskins, MD   1,000 mL at 01/11/12 0940   The PMH, PSH, Social History, Family History, Medications, and allergies have been reviewed in [facility], and have been updated if relevant.     The PMH, PSH, Social History, Family History, Medications, and allergies have been reviewed in [facility], and have been updated if relevant.   ROS: See HPI.  Otherwise, noncontributory.  Physical exam: BP 120/84  Pulse 126  Temp(Src) 98.8 F (37.1 C) (Oral)  Wt 189 lb (85.73 kg)  SpO2 95%  LMP 12/21/2011  Gen:  Alert, coughing spell- cannot stop long enough to take deep breaths.  INasal and oral exam o/w unremarkable. CVS:  nitially mildly tachy but regular Resp:  After nebs, dec in air movement but clear Ext well perfused  After treatment, RR slowed, inc in air movement noted with less wob- and she was more comfortable   Assessment and Plan: 1. ASTHMA   .New- discussed ordering a nebulizer to have at home. Pt will call [employer] to see if it would be cheaper for her to rent it through a home health agency and call me back. Finish course of prednisone. No indication for abx at this time. The patient indicates understanding of these issues and agrees with the plan.

## 2012-01-12 NOTE — Patient Instructions (Signed)
Good to see you. Please finish your course of prednisone. Call your insurance company to see how they would prefer for [place] to order a nebulizer machine- i.e through [facility]?

## 2012-01-12 NOTE — ED Provider Notes (Signed)
Medical screening examination/treatment/procedure(s) were conducted as a shared visit with non-physician practitioner(s) and myself.  I personally evaluated the patient during the encounter   Joya Gaskins, MD 01/12/12 [phone]

## 2012-01-13 MED ORDER — ALBUTEROL SULFATE (5 MG/ML) 0.5% IN NEBU: 2.5000 mg | INHALATION_SOLUTION | Freq: Once | RESPIRATORY_TRACT | Status: DC

## 2012-01-13 MED ORDER — IPRATROPIUM BROMIDE 0.02 % IN SOLN: 0.5000 mg | Freq: Once | RESPIRATORY_TRACT | Status: DC

## 2012-01-13 NOTE — Progress Notes (Signed)
Addended by: Eliezer Bottom on: 01/13/2012 12:35 PM   Modules accepted: Orders

## 2012-02-01 HISTORY — PX: TENDON REPAIR: SHX5111

## 2012-02-16 ENCOUNTER — Other Ambulatory Visit: Payer: Self-pay | Admitting: Family Medicine

## 2012-02-16 ENCOUNTER — Encounter (HOSPITAL_COMMUNITY): Payer: Self-pay | Admitting: Pharmacist

## 2012-02-23 ENCOUNTER — Encounter (HOSPITAL_COMMUNITY)
Admission: RE | Admit: 2012-02-23 | Discharge: 2012-02-23 | Disposition: A | Discharge status: Home / Self Care | Payer: BC Managed Care – PPO | Source: Ambulatory Visit | Attending: Obstetrics & Gynecology | Admitting: Obstetrics & Gynecology

## 2012-02-23 ENCOUNTER — Encounter (HOSPITAL_COMMUNITY): Payer: Self-pay

## 2012-02-23 HISTORY — DX: Nausea with vomiting, unspecified: R11.2

## 2012-02-23 HISTORY — DX: Other specified postprocedural states: Z98.890

## 2012-02-23 HISTORY — DX: Angina pectoris, unspecified: I20.9

## 2012-02-23 HISTORY — DX: Fibromyalgia: M79.7

## 2012-02-23 HISTORY — DX: Essential (primary) hypertension: I10

## 2012-02-23 LAB — CBC
HCT: 43.1 % (ref 36.0–46.0)
Hemoglobin: 14.3 g/dL (ref 12.0–15.0)
MCHC: 33.2 g/dL (ref 30.0–36.0)

## 2012-02-23 LAB — BASIC METABOLIC PANEL
BUN: 12 mg/dL (ref 6–23)
GFR calc non Af Amer: 85 mL/min — ABNORMAL LOW (ref >90)
Glucose, Bld: 145 mg/dL — ABNORMAL HIGH (ref 70–99)
Potassium: 3.7 mEq/L (ref 3.5–5.1)

## 2012-02-23 NOTE — Pre-Procedure Instructions (Signed)
Pt states she is a difficult IV stick-has never needed picc line, was in ER 01/11/12-no problem when used lidocaine prior to stick, also no problem for tendon repair in May 13

## 2012-02-23 NOTE — Pre-Procedure Instructions (Signed)
Pt history reviewed with Dr Rodman Pickle, Asthma and recent ER Admit. Reviewed EKG, aware of history of angina-nonspecific 4-5 years ago with no re-occurrence.

## 2012-02-23 NOTE — Patient Instructions (Addendum)
   Your procedure is scheduled on: Monday June 24th  Enter through the Main Entrance of [facility] at:6am Pick up the phone at the desk and dial [phone] and inform us of your arrival.  Please call this number if you have any problems the morning of surgery: [phone]  Remember: Do not eat food after midnight: Sunday Do not drink clear liquids after:midnight Sunday Take these medicines the morning of surgery with a SIP OF WATER:blood pressure medicine, bring inhalers.  Do not wear jewelry, make-up, or FINGER nail polish Do not wear lotions, powders, perfumes or deodorant. Do not shave 48 hours prior to surgery. Do not bring valuables to the hospital. Contacts, dentures or bridgework may not be worn into surgery.  Leave suitcase in the car. After Surgery it may be brought to your room. For patients being admitted to the hospital, checkout time is 11:00am the day of discharge.  Patients discharged on the day of surgery will not be allowed to drive home.     Remember to use your hibiclens as instructed.Please shower with 1/2 bottle the evening before your surgery and the other 1/2 bottle the morning of surgery. Neck down avoiding private area.

## 2012-02-29 MED ORDER — CEFAZOLIN SODIUM-DEXTROSE 2-3 GM-% IV SOLR
2.0000 g | INTRAVENOUS | Status: AC
  Administered 2012-03-01: 2 g via INTRAVENOUS
  Filled 2012-02-29: qty 50

## 2012-03-01 ENCOUNTER — Encounter (HOSPITAL_COMMUNITY): Payer: Self-pay

## 2012-03-01 ENCOUNTER — Encounter (HOSPITAL_COMMUNITY)
Admission: RE | Disposition: A | Discharge status: Home / Self Care | Payer: Self-pay | Source: Ambulatory Visit | Attending: Obstetrics & Gynecology

## 2012-03-01 ENCOUNTER — Encounter (HOSPITAL_COMMUNITY): Payer: Self-pay | Admitting: Anesthesiology

## 2012-03-01 ENCOUNTER — Ambulatory Visit (HOSPITAL_COMMUNITY): Payer: BC Managed Care – PPO | Admitting: Anesthesiology

## 2012-03-01 ENCOUNTER — Ambulatory Visit (HOSPITAL_COMMUNITY)
Admission: RE | Admit: 2012-03-01 | Discharge: 2012-03-01 | Disposition: A | Discharge status: Home / Self Care | Payer: BC Managed Care – PPO | Source: Ambulatory Visit | Attending: Obstetrics & Gynecology | Admitting: Obstetrics & Gynecology

## 2012-03-01 DIAGNOSIS — N925 Other specified irregular menstruation: Secondary | ICD-10-CM

## 2012-03-01 DIAGNOSIS — N949 Unspecified condition associated with female genital organs and menstrual cycle: Secondary | ICD-10-CM

## 2012-03-01 DIAGNOSIS — Z01812 Encounter for preprocedural laboratory examination: Secondary | ICD-10-CM | POA: Insufficient documentation

## 2012-03-01 DIAGNOSIS — N393 Stress incontinence (female) (male): Secondary | ICD-10-CM

## 2012-03-01 DIAGNOSIS — N938 Other specified abnormal uterine and vaginal bleeding: Secondary | ICD-10-CM | POA: Insufficient documentation

## 2012-03-01 DIAGNOSIS — D259 Leiomyoma of uterus, unspecified: Secondary | ICD-10-CM

## 2012-03-01 DIAGNOSIS — Z01818 Encounter for other preprocedural examination: Secondary | ICD-10-CM

## 2012-03-01 HISTORY — PX: CYSTOSCOPY: SHX5120

## 2012-03-01 HISTORY — DX: Calculus of kidney: N20.0

## 2012-03-01 HISTORY — DX: Duplication of ureter: Q62.5

## 2012-03-01 HISTORY — PX: BLADDER SUSPENSION: SHX72

## 2012-03-01 LAB — PREGNANCY, URINE: Preg Test, Ur: NEGATIVE

## 2012-03-01 SURGERY — ROBOTIC ASSISTED TOTAL HYSTERECTOMY
Anesthesia: General | Site: Vagina | Wound class: Clean Contaminated

## 2012-03-01 MED ORDER — DIPHENHYDRAMINE HCL 50 MG/ML IJ SOLN: 12.5000 mg | Freq: Four times a day (QID) | INTRAMUSCULAR | Status: DC | PRN

## 2012-03-01 MED ORDER — ONDANSETRON HCL 4 MG/2ML IJ SOLN
INTRAMUSCULAR | Status: DC | PRN
  Administered 2012-03-01: 4 mg via INTRAVENOUS

## 2012-03-01 MED ORDER — ONDANSETRON HCL 4 MG/2ML IJ SOLN
INTRAMUSCULAR | Status: AC
  Filled 2012-03-01: qty 2

## 2012-03-01 MED ORDER — ARTIFICIAL TEARS OP OINT
TOPICAL_OINTMENT | OPHTHALMIC | Status: DC | PRN
  Administered 2012-03-01: 1 via OPHTHALMIC

## 2012-03-01 MED ORDER — FENTANYL CITRATE 0.05 MG/ML IJ SOLN
100.0000 ug | Freq: Once | INTRAMUSCULAR | Status: AC
  Administered 2012-03-01: 100 ug via INTRAVENOUS

## 2012-03-01 MED ORDER — DEXAMETHASONE SODIUM PHOSPHATE 4 MG/ML IJ SOLN
INTRAMUSCULAR | Status: DC | PRN
  Administered 2012-03-01: 10 mg via INTRAVENOUS

## 2012-03-01 MED ORDER — FENTANYL CITRATE 0.05 MG/ML IJ SOLN
25.0000 ug | INTRAMUSCULAR | Status: DC | PRN
  Administered 2012-03-01 (×4): 50 ug via INTRAVENOUS

## 2012-03-01 MED ORDER — NEOSTIGMINE METHYLSULFATE 1 MG/ML IJ SOLN
INTRAMUSCULAR | Status: AC
  Filled 2012-03-01: qty 10

## 2012-03-01 MED ORDER — SCOPOLAMINE 1 MG/3DAYS TD PT72
1.0000 | MEDICATED_PATCH | TRANSDERMAL | Status: DC
  Administered 2012-03-01: 1.5 mg via TRANSDERMAL

## 2012-03-01 MED ORDER — ONDANSETRON HCL 4 MG/2ML IJ SOLN: 4.0000 mg | Freq: Once | INTRAMUSCULAR | Status: DC | PRN

## 2012-03-01 MED ORDER — FENTANYL CITRATE 0.05 MG/ML IJ SOLN
INTRAMUSCULAR | Status: AC
  Filled 2012-03-01: qty 2

## 2012-03-01 MED ORDER — MEPERIDINE HCL 25 MG/ML IJ SOLN: 6.2500 mg | INTRAMUSCULAR | Status: DC | PRN

## 2012-03-01 MED ORDER — IBUPROFEN 800 MG PO TABS
800.0000 mg | ORAL_TABLET | Freq: Three times a day (TID) | ORAL | Status: DC
  Administered 2012-03-01: 800 mg via ORAL
  Filled 2012-03-01: qty 1

## 2012-03-01 MED ORDER — MIDAZOLAM HCL 2 MG/2ML IJ SOLN
INTRAMUSCULAR | Status: AC
  Filled 2012-03-01: qty 2

## 2012-03-01 MED ORDER — STERILE WATER FOR IRRIGATION IR SOLN
Status: DC | PRN
  Administered 2012-03-01: 1000 mL via INTRAVESICAL

## 2012-03-01 MED ORDER — LIDOCAINE HCL (CARDIAC) 20 MG/ML IV SOLN
INTRAVENOUS | Status: AC
  Filled 2012-03-01: qty 5

## 2012-03-01 MED ORDER — NEOSTIGMINE METHYLSULFATE 1 MG/ML IJ SOLN
INTRAMUSCULAR | Status: DC | PRN
  Administered 2012-03-01: 4 mg via INTRAVENOUS

## 2012-03-01 MED ORDER — GLYCOPYRROLATE 0.2 MG/ML IJ SOLN
INTRAMUSCULAR | Status: DC | PRN
  Administered 2012-03-01: .8 mg via INTRAVENOUS

## 2012-03-01 MED ORDER — KETOROLAC TROMETHAMINE 30 MG/ML IJ SOLN
INTRAMUSCULAR | Status: AC
  Filled 2012-03-01: qty 1

## 2012-03-01 MED ORDER — HYDROMORPHONE 0.3 MG/ML IV SOLN
INTRAVENOUS | Status: DC
  Administered 2012-03-01: 4.8 mg via INTRAVENOUS
  Administered 2012-03-01: 12:00:00 via INTRAVENOUS
  Filled 2012-03-01: qty 25

## 2012-03-01 MED ORDER — MIDAZOLAM HCL 2 MG/2ML IJ SOLN
2.0000 mg | Freq: Once | INTRAMUSCULAR | Status: AC
  Administered 2012-03-01: 2 mg via INTRAVENOUS

## 2012-03-01 MED ORDER — FLAVOXATE HCL 100 MG PO TABS
100.0000 mg | ORAL_TABLET | Freq: Three times a day (TID) | ORAL | Status: DC | PRN
  Administered 2012-03-01 (×2): 100 mg via ORAL
  Filled 2012-03-01 (×2): qty 1

## 2012-03-01 MED ORDER — BUPIVACAINE HCL (PF) 0.5 % IJ SOLN
INTRAMUSCULAR | Status: AC
  Filled 2012-03-01: qty 30

## 2012-03-01 MED ORDER — OXYCODONE HCL 10 MG PO TB12: 10.0000 mg | ORAL_TABLET | Freq: Two times a day (BID) | ORAL | Status: DC

## 2012-03-01 MED ORDER — DIPHENHYDRAMINE HCL 12.5 MG/5ML PO ELIX: 12.5000 mg | ORAL_SOLUTION | Freq: Four times a day (QID) | ORAL | Status: DC | PRN

## 2012-03-01 MED ORDER — LIDOCAINE HCL (CARDIAC) 20 MG/ML IV SOLN
INTRAVENOUS | Status: DC | PRN
  Administered 2012-03-01: 100 mg via INTRAVENOUS

## 2012-03-01 MED ORDER — MIDAZOLAM HCL 5 MG/5ML IJ SOLN
INTRAMUSCULAR | Status: DC | PRN
  Administered 2012-03-01: 2 mg via INTRAVENOUS

## 2012-03-01 MED ORDER — PROPOFOL 10 MG/ML IV EMUL
INTRAVENOUS | Status: AC
  Filled 2012-03-01: qty 20

## 2012-03-01 MED ORDER — NALOXONE HCL 0.4 MG/ML IJ SOLN: 0.4000 mg | INTRAMUSCULAR | Status: DC | PRN

## 2012-03-01 MED ORDER — ACETAMINOPHEN 10 MG/ML IV SOLN
1000.0000 mg | Freq: Four times a day (QID) | INTRAVENOUS | Status: DC
  Administered 2012-03-01: 1000 mg via INTRAVENOUS
  Filled 2012-03-01 (×4): qty 100

## 2012-03-01 MED ORDER — LACTATED RINGERS IV SOLN
INTRAVENOUS | Status: DC
  Administered 2012-03-01: 125 mL/h via INTRAVENOUS

## 2012-03-01 MED ORDER — BUPIVACAINE HCL (PF) 0.5 % IJ SOLN
INTRAMUSCULAR | Status: DC | PRN
  Administered 2012-03-01: 20 mL

## 2012-03-01 MED ORDER — EPHEDRINE 5 MG/ML INJ
INTRAVENOUS | Status: AC
  Filled 2012-03-01: qty 10

## 2012-03-01 MED ORDER — DIPHENHYDRAMINE HCL 25 MG PO CAPS
50.0000 mg | ORAL_CAPSULE | Freq: Once | ORAL | Status: AC
  Administered 2012-03-01: 50 mg via ORAL
  Filled 2012-03-01: qty 2

## 2012-03-01 MED ORDER — PHENYLEPHRINE HCL 10 MG/ML IJ SOLN
INTRAMUSCULAR | Status: DC | PRN
  Administered 2012-03-01 (×2): 80 ug via INTRAVENOUS

## 2012-03-01 MED ORDER — ROCURONIUM BROMIDE 100 MG/10ML IV SOLN
INTRAVENOUS | Status: DC | PRN
  Administered 2012-03-01: 10 mg via INTRAVENOUS
  Administered 2012-03-01: 50 mg via INTRAVENOUS

## 2012-03-01 MED ORDER — FENTANYL CITRATE 0.05 MG/ML IJ SOLN
INTRAMUSCULAR | Status: DC | PRN
  Administered 2012-03-01 (×2): 50 ug via INTRAVENOUS
  Administered 2012-03-01: 25 ug via INTRAVENOUS
  Administered 2012-03-01: 100 ug via INTRAVENOUS
  Administered 2012-03-01: 25 ug via INTRAVENOUS

## 2012-03-01 MED ORDER — KETOROLAC TROMETHAMINE 30 MG/ML IJ SOLN
15.0000 mg | Freq: Once | INTRAMUSCULAR | Status: AC | PRN
  Administered 2012-03-01: 30 mg via INTRAVENOUS

## 2012-03-01 MED ORDER — SCOPOLAMINE 1 MG/3DAYS TD PT72
MEDICATED_PATCH | TRANSDERMAL | Status: AC
  Administered 2012-03-01: 1.5 mg via TRANSDERMAL
  Filled 2012-03-01: qty 1

## 2012-03-01 MED ORDER — INDIGOTINDISULFONATE SODIUM 8 MG/ML IJ SOLN
INTRAMUSCULAR | Status: DC | PRN
  Administered 2012-03-01: 40 mg via INTRAVENOUS

## 2012-03-01 MED ORDER — EPHEDRINE SULFATE 50 MG/ML IJ SOLN
INTRAMUSCULAR | Status: DC | PRN
  Administered 2012-03-01 (×2): 5 mg via INTRAVENOUS

## 2012-03-01 MED ORDER — ROPIVACAINE HCL 5 MG/ML IJ SOLN
INTRAMUSCULAR | Status: DC | PRN
  Administered 2012-03-01: 60 mL via EPIDURAL

## 2012-03-01 MED ORDER — SODIUM CHLORIDE 0.9 % IJ SOLN: 9.0000 mL | INTRAMUSCULAR | Status: DC | PRN

## 2012-03-01 MED ORDER — ONDANSETRON HCL 4 MG/2ML IJ SOLN: 4.0000 mg | Freq: Four times a day (QID) | INTRAMUSCULAR | Status: DC | PRN

## 2012-03-01 MED ORDER — OXYCODONE HCL 10 MG PO TB12
10.0000 mg | ORAL_TABLET | Freq: Two times a day (BID) | ORAL | Status: DC
  Administered 2012-03-01: 10 mg via ORAL
  Filled 2012-03-01: qty 1

## 2012-03-01 MED ORDER — PROPOFOL 10 MG/ML IV BOLUS
INTRAVENOUS | Status: DC | PRN
  Administered 2012-03-01: 200 mg via INTRAVENOUS

## 2012-03-01 MED ORDER — ROPIVACAINE HCL 5 MG/ML IJ SOLN
INTRAMUSCULAR | Status: AC
  Filled 2012-03-01: qty 60

## 2012-03-01 MED ORDER — PROMETHAZINE HCL 25 MG PO TABS
25.0000 mg | ORAL_TABLET | Freq: Four times a day (QID) | ORAL | Status: DC | PRN
  Filled 2012-03-01: qty 1

## 2012-03-01 MED ORDER — PHENYLEPHRINE 40 MCG/ML (10ML) SYRINGE FOR IV PUSH (FOR BLOOD PRESSURE SUPPORT)
PREFILLED_SYRINGE | INTRAVENOUS | Status: AC
  Filled 2012-03-01: qty 10

## 2012-03-01 MED ORDER — GLYCOPYRROLATE 0.2 MG/ML IJ SOLN
INTRAMUSCULAR | Status: AC
  Filled 2012-03-01: qty 1

## 2012-03-01 MED ORDER — ROCURONIUM BROMIDE 50 MG/5ML IV SOLN
INTRAVENOUS | Status: AC
  Filled 2012-03-01: qty 1

## 2012-03-01 MED ORDER — IBUPROFEN 800 MG PO TABS: 800.0000 mg | ORAL_TABLET | Freq: Three times a day (TID) | ORAL | Status: AC

## 2012-03-01 MED ORDER — INDIGOTINDISULFONATE SODIUM 8 MG/ML IJ SOLN
INTRAMUSCULAR | Status: AC
  Filled 2012-03-01: qty 5

## 2012-03-01 MED ORDER — FENTANYL CITRATE 0.05 MG/ML IJ SOLN
INTRAMUSCULAR | Status: AC
  Filled 2012-03-01: qty 5

## 2012-03-01 MED ORDER — PHENYLEPHRINE HCL 10 MG/ML IJ SOLN: INTRAMUSCULAR | Status: DC | PRN

## 2012-03-01 MED ORDER — LACTATED RINGERS IR SOLN
Status: DC | PRN
  Administered 2012-03-01: 3000 mL

## 2012-03-01 MED ORDER — DEXAMETHASONE SODIUM PHOSPHATE 10 MG/ML IJ SOLN
INTRAMUSCULAR | Status: AC
  Filled 2012-03-01: qty 1

## 2012-03-01 SURGICAL SUPPLY — 86 items
BAG URINE DRAINAGE (UROLOGICAL SUPPLIES) ×5 IMPLANT
BARRIER ADHS 3X4 INTERCEED (GAUZE/BANDAGES/DRESSINGS) IMPLANT
BENZOIN TINCTURE PRP APPL 2/3 (GAUZE/BANDAGES/DRESSINGS) ×5 IMPLANT
BLADE LAPAROSCOPIC MORCELL KIT (BLADE) IMPLANT
BLADE SURG 11 STRL SS (BLADE) ×5 IMPLANT
BLADE SURG 15 STRL LF C SS BP (BLADE) ×4 IMPLANT
BLADE SURG 15 STRL SS (BLADE) ×1
CABLE HIGH FREQUENCY MONO STRZ (ELECTRODE) ×5 IMPLANT
CANISTER SUCTION 2500CC (MISCELLANEOUS) ×5 IMPLANT
CATH FOLEY 2WAY SLVR  5CC 18FR (CATHETERS) ×1
CATH FOLEY 2WAY SLVR 5CC 18FR (CATHETERS) ×4 IMPLANT
CATH FOLEY 3WAY  5CC 16FR (CATHETERS) ×1
CATH FOLEY 3WAY 5CC 16FR (CATHETERS) ×4 IMPLANT
CLOTH BEACON ORANGE TIMEOUT ST (SAFETY) ×5 IMPLANT
CONT PATH 16OZ SNAP LID 3702 (MISCELLANEOUS) ×5 IMPLANT
COVER MAYO STAND STRL (DRAPES) ×5 IMPLANT
COVER TABLE BACK 60X90 (DRAPES) ×10 IMPLANT
COVER TIP SHEARS 8 DVNC (MISCELLANEOUS) ×4 IMPLANT
COVER TIP SHEARS 8MM DA VINCI (MISCELLANEOUS) ×1
DECANTER SPIKE VIAL GLASS SM (MISCELLANEOUS) ×5 IMPLANT
DERMABOND ADVANCED (GAUZE/BANDAGES/DRESSINGS) ×2
DERMABOND ADVANCED .7 DNX12 (GAUZE/BANDAGES/DRESSINGS) ×8 IMPLANT
DEVICE TROCAR PUNCTURE CLOSURE (ENDOMECHANICALS) ×5 IMPLANT
DRAPE HUG U DISPOSABLE (DRAPE) ×5 IMPLANT
DRAPE HYSTEROSCOPY (DRAPE) ×5 IMPLANT
DRAPE LG THREE QUARTER DISP (DRAPES) ×10 IMPLANT
DRAPE MONITOR DA VINCI (DRAPE) IMPLANT
DRAPE WARM FLUID 44X44 (DRAPE) ×5 IMPLANT
ELECT REM PT RETURN 9FT ADLT (ELECTROSURGICAL) ×5
ELECTRODE REM PT RTRN 9FT ADLT (ELECTROSURGICAL) ×4 IMPLANT
EVACUATOR SMOKE 8.L (FILTER) ×5 IMPLANT
GAUZE VASELINE 3X9 (GAUZE/BANDAGES/DRESSINGS) IMPLANT
GLOVE BIOGEL PI IND STRL 6.5 (GLOVE) ×24 IMPLANT
GLOVE BIOGEL PI IND STRL 7.0 (GLOVE) ×8 IMPLANT
GLOVE BIOGEL PI INDICATOR 6.5 (GLOVE) ×6
GLOVE BIOGEL PI INDICATOR 7.0 (GLOVE) ×2
GLOVE SS N UNI LF 6.5 STRL (GLOVE) ×30 IMPLANT
GLOVE SS N UNI LF 7.0 STRL (GLOVE) ×10 IMPLANT
GOWN PREVENTION PLUS LG XLONG (DISPOSABLE) ×20 IMPLANT
GOWN STRL REIN XL XLG (GOWN DISPOSABLE) ×30 IMPLANT
HEMOSTAT SURGICEL 2X3 (HEMOSTASIS) IMPLANT
KIT ACCESSORY DA VINCI DISP (KITS) ×1
KIT ACCESSORY DVNC DISP (KITS) ×4 IMPLANT
KIT DISP ACCESSORY 4 ARM (KITS) IMPLANT
MANIPULATOR UTERINE 4.5 ZUMI (MISCELLANEOUS) IMPLANT
NEEDLE INSUFFLATION 14GA 120MM (NEEDLE) ×5 IMPLANT
NEEDLE SPNL 18GX3.5 QUINCKE PK (NEEDLE) ×5 IMPLANT
NS IRRIG 1000ML POUR BTL (IV SOLUTION) ×5 IMPLANT
OCCLUDER COLPOPNEUMO (BALLOONS) ×5 IMPLANT
PACK LAVH (CUSTOM PROCEDURE TRAY) ×5 IMPLANT
PACK VAGINAL WOMENS (CUSTOM PROCEDURE TRAY) ×5 IMPLANT
PAD PREP 24X48 CUFFED NSTRL (MISCELLANEOUS) ×10 IMPLANT
PLUG CATH AND CAP STER (CATHETERS) ×5 IMPLANT
PROTECTOR NERVE ULNAR (MISCELLANEOUS) ×10 IMPLANT
SET CYSTO W/LG BORE CLAMP LF (SET/KITS/TRAYS/PACK) ×5 IMPLANT
SET IRRIG TUBING LAPAROSCOPIC (IRRIGATION / IRRIGATOR) ×5 IMPLANT
SLING TRANS VAGINAL TAPE (Sling) ×1 IMPLANT
SLING UTERINE/ABD GYNECARE TVT (Sling) ×4 IMPLANT
SOLUTION ELECTROLUBE (MISCELLANEOUS) ×5 IMPLANT
SPONGE LAP 18X18 X RAY DECT (DISPOSABLE) IMPLANT
STRIP CLOSURE SKIN 1/2X4 (GAUZE/BANDAGES/DRESSINGS) ×5 IMPLANT
SUT VIC AB 0 CT1 27 (SUTURE) ×2
SUT VIC AB 0 CT1 27XBRD ANBCTR (SUTURE) ×8 IMPLANT
SUT VIC AB 0 CT1 27XBRD ANTBC (SUTURE) IMPLANT
SUT VIC AB 2-0 SH 27 (SUTURE) ×2
SUT VIC AB 2-0 SH 27XBRD (SUTURE) ×8 IMPLANT
SUT VIC AB 4-0 SH 27 (SUTURE)
SUT VIC AB 4-0 SH 27XANBCTRL (SUTURE) IMPLANT
SUT VICRYL 0 UR6 27IN ABS (SUTURE) ×10 IMPLANT
SUT VICRYL RAPIDE 4/0 PS 2 (SUTURE) ×10 IMPLANT
SUT VLOC 180 0 9IN  GS21 (SUTURE) ×1
SUT VLOC 180 0 9IN GS21 (SUTURE) ×4 IMPLANT
SYR 50ML LL SCALE MARK (SYRINGE) ×5 IMPLANT
SYSTEM CONVERTIBLE TROCAR (TROCAR) IMPLANT
TIP UTERINE 5.1X6CM LAV DISP (MISCELLANEOUS) IMPLANT
TIP UTERINE 6.7X10CM GRN DISP (MISCELLANEOUS) IMPLANT
TIP UTERINE 6.7X6CM WHT DISP (MISCELLANEOUS) IMPLANT
TIP UTERINE 6.7X8CM BLUE DISP (MISCELLANEOUS) ×5 IMPLANT
TOWEL OR 17X24 6PK STRL BLUE (TOWEL DISPOSABLE) ×10 IMPLANT
TRAY FOLEY CATH 14FR (SET/KITS/TRAYS/PACK) ×5 IMPLANT
TROCAR DISP BLADELESS 8 DVNC (TROCAR) ×4 IMPLANT
TROCAR DISP BLADELESS 8MM (TROCAR) ×1
TROCAR XCEL 12X100 BLDLESS (ENDOMECHANICALS) ×5 IMPLANT
TROCAR Z-THREAD 12X150 (TROCAR) ×5 IMPLANT
TUBING FILTER THERMOFLATOR (ELECTROSURGICAL) ×5 IMPLANT
WATER STERILE IRR 1000ML POUR (IV SOLUTION) ×15 IMPLANT

## 2012-03-01 NOTE — Discharge Instructions (Signed)
Hysterectomy Information  A hysterectomy is a procedure where your uterus is surgically removed. It will no longer be possible to have menstrual periods or to become pregnant. The tubes and ovaries can be removed (bilateral salpingo-oopherectomy) during this surgery as well.  REASONS FOR A HYSTERECTOMY  Persistent, abnormal bleeding.   Lasting (chronic) pelvic pain or infection.   The lining of the uterus (endometrium) starts growing outside the uterus (endometriosis).   The endometrium starts growing in the muscle of the uterus (adenomyosis).   The uterus falls down into the vagina (pelvic organ prolapse).   Symptomatic uterine fibroids.   Precancerous cells.   Cervical cancer or uterine cancer.  TYPES OF HYSTERECTOMIES  Supracervical hysterectomy. This type removes the top part of the uterus, but not the cervix.   Total hysterectomy. This type removes the uterus and cervix.   Radical hysterectomy. This type removes the uterus, cervix, and the fibrous tissue that holds the uterus in place in the pelvis (parametrium).  WAYS A HYSTERECTOMY CAN BE PERFORMED  Abdominal hysterectomy. A large surgical cut (incision) is made in the abdomen. The uterus is removed through this incision.   Vaginal hysterectomy. An incision is made in the vagina. The uterus is removed through this incision. There are no abdominal incisions.   Conventional laparoscopic hysterectomy. A thin, lighted tube with a camera (laparoscope) is inserted into 3 or 4 small incisions in the abdomen. The uterus is cut into small pieces. The small pieces are removed through the incisions, or they are removed through the vagina.   Laparoscopic assisted vaginal hysterectomy (LAVH). Three or four small incisions are made in the abdomen. Part of the surgery is performed laparoscopically and part vaginally. The uterus is removed through the vagina.   Robot-assisted laparoscopic hysterectomy. A laparoscope is inserted into 3 or 4  small incisions in the abdomen. A computer-controlled device is used to give the surgeon a 3D image. This allows for more precise movements of surgical instruments. The uterus is cut into small pieces and removed through the incisions or removed through the vagina.  RISKS OF HYSTERECTOMY   Bleeding and risk of blood transfusion. Tell your caregiver if you do not want to receive any blood products.   Blood clots in the legs or lung.   Infection.   Injury to surrounding organs.   Anesthesia problems or side effects.   Conversion to an abdominal hysterectomy.  WHAT TO EXPECT AFTER A HYSTERECTOMY  You will be given pain medicine.   You will need to have someone with you for the first 3 to 5 days after you go home.   You will need to follow up with your surgeon in 2 to 4 weeks after surgery to evaluate your progress.   You may have early menopause symptoms like hot flashes, night sweats, and insomnia.   If you had a hysterectomy for a problem that was not a cancer or a condition that could lead to cancer, then you no longer need Pap tests. However, even if you no longer need a Pap test, a regular exam is a good idea to make sure no other problems are starting.  Document Released: 02/18/2001 Document Revised: 08/14/2011 Document Reviewed: 04/05/2011 [employer] Patient Information 2012 [employer]. 

## 2012-03-01 NOTE — Op Note (Signed)
03/01/2012  9:43 AM  PATIENT:  Melissa Allen  46 y.o. female  PRE-OPERATIVE DIAGNOSIS:   DUB;fibroids;urinary incontinence  POST-OPERATIVE DIAGNOSIS:  DUB,fibroids, urinary incontinence  PROCEDURE:  Procedure(s) (LRB): ROBOTIC ASSISTED TOTAL HYSTERECTOMY (N/A) TRANSVAGINAL TAPE (TVT) PROCEDURE (N/A) CYSTOSCOPY (N/A) BILATERAL SALPINGECTOMY (Bilateral)  SURGEON:  Surgeon(s) and Role:    * Allie Bossier, MD - Primary   g  PHYSICIAN ASSISTANT:   ASSISTANTS: Scheryl Darter, MD   ANESTHESIA:   general  EBL:  Total I/O In: 600 [I.V.:600] Out: 300 [Urine:200; Blood:100]  BLOOD ADMINISTERED:none  DRAINS: none   LOCAL MEDICATIONS USED:  OTHER ropivicaine and marcaine  SPECIMEN:  Source of Specimen:  uterus and oviducts  DISPOSITION OF SPECIMEN:  PATHOLOGY  COUNTS:  YES  TOURNIQUET:  * No tourniquets in log *  DICTATION: .Dragon Dictation  PLAN OF CARE: outpatient with extended recovery  PATIENT DISPOSITION:  PACU - hemodynamically stable.   Delay start of Pharmacological VTE agent (>24hrs) due to surgical blood loss or risk of bleeding: not applicable  The risks, benefits, and alternatives of surgery were explained, understood, accepted. All questions were answered and consents were signed. I specifically told her that robotic approach has an increased incidence of vaginal cuff dehiscence.I also coated her normal risks associated with the surgery including, but not limited to, infection, bleeding, damage to bowel, bladder, ureters. In the operating room she was placed in the dorsal lithotomy position and general anesthesia was applied without complication. Her abdomen and vagina were prepped and draped in the usual sterile fashion after extensive careful tucking measures were performed. A bimanual exam revealed a 6 week's size uterus A Rum iuterine manipulator was placed. Please note that the uterus sounded to 9 cm. The cervix was dilated to accommodate the retractor. I did a  paravaginal cuff block with the dilute ropivacaine (10 cc). I placed a latex-free Foley catheter, and it drained clear urine throughout the case.  Gloves were changed and I turned my attention to the abdomen. A vertical umbilical incision was made and a varies needle was placed intraperitoneally. Low-flow CO2 was used to insufflate the abdomen to approximately 4 L. Patient abdominal pressure was always less than 15. Llaparoscopy confirmed correct placement. She was placed in steep Trendelenburg position. The pelvis was inspected. The ovaries had a normal appearance, slightly atrophic.Her uterus did appear to have a 4 cm fibroid. We then placed a 12 mm port in the right lower quadrant and two 8 mm ports approximately 12 cm lateral to the umbilicus. Prior to making the incisions, each incision site was injected with 10 mL of dilute ropivacaine. The ports were placed in the robot was docked. I then proceeded to the robotic console. The ureters position were were noted bilaterally throughout the case. The utero ovarian  ligaments were identified bilaterally and cauterized using the PK device. The round ligaments were also identified and ligated with the PK device. A bladder flap was created anteriorly, taking care to avoid damage to the bladder. An anterior colpotomy was made. This incision was carried around circumferentially.  Please note that prior to the colpotomy incision I ligated the uterine vessels bilaterally. The uterus was removed through  through the vagina. The vaginal cuff was closed with a 0 vicryl V-lock suture. Excellent hemostasis was noted at all pedicles and the vaginal cuff. Both ureters were noted to be functioning normally and of normal caliber. I then used the PK device to excise the oviducts.  We then closed the 12  mm system port site with a Endoloop closure.  The robot was undocked and all ports were removed.  The fascia at the supraumbilical camera port incision was closed with 0 Vicryl, and  the subcutaneous tissue at all sites was closed with 4-0 Vicryl suture. I then proceded with the TVT exact placement. I injected a total of 30 mL of 0.5% marcaine in the areas behind the symphysis pubis where the sling would be elevated and in the vaginal mucosa about 1 cm below the urethral meatus. I made an incision at this site and placed a bladder stylus for bladder manipulation. I disected the periurethral space with Metzenbaum scissors. I placed the TVT Exact device per standard and elevated the mesh, keeping a Kelly clamp between the mesh and the urethra (to keep it from being under tension). The vaginal mucosa was closed with 2-0 vicryl and the suprapubic sites were closed with Dermabond. She was given indigo carmine and cystoscopy was done. No bladder injury was noted and both ureteral orifices were noted to be effluxing blue urine. The instrument, sponge, and needle counts were correct. She tolerated the procedure well. She was extubated and taken to recovery in stable condition.

## 2012-03-01 NOTE — Anesthesia Procedure Notes (Signed)
Procedure Name: Intubation Date/Time: 03/01/2012 7:38 AM Performed by: Emoree Sasaki, Jannet Askew Pre-anesthesia Checklist: Patient identified, Patient being monitored, Emergency Drugs available, Timeout performed and Suction available Patient Re-evaluated:Patient Re-evaluated prior to inductionOxygen Delivery Method: Circle system utilized, Ambu bag, Simple face mask and Non-rebreather mask Preoxygenation: Pre-oxygenation with 100% oxygen Intubation Type: IV induction Ventilation: Mask ventilation without difficulty Laryngoscope Size: Mac and 3 Grade View: Grade I Tube type: Oral Tube size: 7.0 mm Number of attempts: 1 Airway Equipment and Method: Patient positioned with wedge pillow Placement Confirmation: ETT inserted through vocal cords under direct vision,  breath sounds checked- equal and bilateral and positive ETCO2 Secured at: 22 cm Dental Injury: Teeth and Oropharynx as per pre-operative assessment

## 2012-03-01 NOTE — H&P (Signed)
Melissa Allen is an 46 y.o. female. She complains of irregular periods, bleeds 5-7 days with her period. She will then experience a second bleeding episode sometime during the month. Sometimes with severe cramping. She has been on twice daily OCPs to help this problem. Her ultrasound showed a 4 cm fibroid. She does have some hot flashes and has skipped a period this year. Her embx was normal.  Her other complaint is that of GSUI. It has been present for the last year, very problematic with her asthma attacks. She would like a sling. Pertinent Gynecological History: Menses: flow is moderate Bleeding: intermenstrual bleeding Contraception: OCPs DES exposure: denies Blood transfusions: none Sexually transmitted diseases: no past history Previous GYN Procedures: n/a  Last mammogram: normal Date: 10/2011 Last pap: normal Date: 2013 OB History: G3, P3 ( 27, 25, 13)  Menstrual History: Menarche age:14No LMP recorded.    Past Medical History  Diagnosis Date  . Allergy   . Calcaneal fracture   . Obesity (BMI 30.0-34.9)   . Asthma     recent admit to ER 01/11/12 for exac of asthma  . Hypertension   . Fibromyalgia   . Anginal pain     one episode 4-5 years ago-[facility] [place]-nonspecific-no problems since-  . PONV (postoperative nausea and vomiting)   . Kidney stones   . Duplicated ureter, right     Past Surgical History  Procedure Date  . Tonsillectomy age 80  . Bunionectomy 1995    Right  . Cholecystectomy late 1980's       . Lumbar laminectomy 10/06    L4-5, Ray  . Knee surgery     x 2, Murphy/Sue  . Lithotripsy     Multiple  . R compressed pronator     Sypher  . Tendon repair 2013  . Tendon repair 02/01/12    Family History  Problem Relation Age of Onset  . Heart disease Father   . Colon cancer Paternal Grandfather   . Colon cancer Maternal Grandmother   . Colon polyps Father   . Uterine cancer      Grandmother  . Breast cancer      Grandmother  .  Ovarian cancer      Grandmother  . Diabetes      mother    Social History:  reports that she has never smoked. She has never used smokeless tobacco. She reports that she drinks about 3.5 ounces of alcohol per week. She reports that she does not use illicit drugs.  Allergies:  Allergies  Allergen Reactions  . Codeine     REACTION: vomiting  . Hydrocodone Nausea And Vomiting  . Latex Hives    Prescriptions prior to admission  Medication Sig Dispense Refill  . albuterol (PROAIR HFA) 108 (90 BASE) MCG/ACT inhaler Inhale 2 puffs into the lungs every 4 (four) hours as needed. Shortness of breath       . CYMBALTA 60 MG capsule TAKE ONE CAPSULE BY MOUTH NIGHTLY AT BEDTIME  30 each  6  . fluticasone (FLONASE) 50 MCG/ACT nasal spray 2 sprays by Nasal route daily.        . Fluticasone-Salmeterol (ADVAIR DISKUS) 250-50 MCG/DOSE AEPB Inhale 1 puff into the lungs every 12 (twelve) hours as needed. For asthma      . gabapentin (NEURONTIN) 400 MG capsule Take 400 mg by mouth at bedtime.      . norgestimate-ethinyl estradiol (SPRINTEC 28) 0.25-35 MG-MCG tablet Take 1 tablet by mouth daily.  1 Package  12  . traZODone (DESYREL) 100 MG tablet TAKE ONE TABLET BY MOUTH NIGHTLY AT BEDTIME  30 tablet  1  . triamterene-hydrochlorothiazide (MAXZIDE-25) 37.5-25 MG per tablet TAKE ONE TABLET BY MOUTH ONE TIME DAILY  30 tablet  5  . Vitamins-Lipotropics (B-100) TABS Take 1 tablet by mouth daily.       . montelukast (SINGULAIR) 10 MG tablet Take 1 tablet (10 mg total) by mouth at bedtime.  30 tablet  5  . nitroGLYCERIN (NITROSTAT) 0.4 MG SL tablet Place 0.4 mg under the tongue. 1 tablet under tongue at onset of chest pain, you may repeat every 5 minutes for up to 3 doses.       . promethazine (PHENERGAN) 25 MG tablet Take 1 tablet (25 mg total) by mouth every 6 (six) hours as needed for nausea.  10 tablet  0    Review of Systems  HENT: Neck pain: OCPs.    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 rate 16, SpO2 99.00%. Physical Exam  Heart- rrr Lungs- CTAB Abd- benign   Results for orders placed during the hospital encounter of 03/01/12 (from the past 24 hour(s))  PREGNANCY, URINE     Status: Normal   Collection Time   03/01/12  6:00 AM      Component Value Range   Preg Test, Ur NEGATIVE  NEGATIVE    No results found.  Assessment/Plan: DUB with fibroid- She would like a hysterectomy and would like the robotic approach. She underastands there are risks associated with surgery including but not limited to infection, bleeding, damage to bowel, bladder, ureters (she has 3). Risk of DVT discussed. She also is aware of the risk of laparatomy.  She would also like a sling for her incontinence. I have made her aware of the 5% risk of new onset urge incontinence as well as the risk of not being able to void temporarily and the need for a catheter. She is also aware of the risk of damage to bowel, bladder.  Eriel Dunckel C. 03/01/2012, 7:08 AM

## 2012-03-01 NOTE — Anesthesia Preprocedure Evaluation (Signed)
Anesthesia Evaluation  Patient identified by MRN, date of birth, ID band Patient awake    Reviewed: Allergy & Precautions, H&P , NPO status , Patient's Chart, lab work & pertinent test results  Airway Mallampati: III TM Distance: >3 FB Neck ROM: full    Dental No notable dental hx. (+) Teeth Intact   Pulmonary neg pulmonary ROS,    Pulmonary exam normal       Cardiovascular     Neuro/Psych negative neurological ROS     GI/Hepatic Neg liver ROS,   Endo/Other  negative endocrine ROS  Renal/GU negative Renal ROS     Musculoskeletal   Abdominal Normal abdominal exam  (+)   Peds  Hematology negative hematology ROS (+)   Anesthesia Other Findings   Reproductive/Obstetrics negative OB ROS                           Anesthesia Physical Anesthesia Plan  ASA: III  Anesthesia Plan: General   Post-op Pain Management:    Induction: Intravenous  Airway Management Planned: Oral ETT  Additional Equipment:   Intra-op Plan:   Post-operative Plan: Extubation in OR  Informed Consent: I have reviewed the patients History and Physical, chart, labs and discussed the procedure including the risks, benefits and alternatives for the proposed anesthesia with the patient or authorized representative who has indicated his/her understanding and acceptance.   Dental Advisory Given  Plan Discussed with: CRNA and Surgeon  Anesthesia Plan Comments:         Anesthesia Quick Evaluation

## 2012-03-01 NOTE — Progress Notes (Signed)
She is now about 6 hours post op. She wants to go home but is unable to void. She is willing to have go home with a catheter and have a voiding trial tomorrow. Her vital signs are stable and her physical exam is normal.

## 2012-03-01 NOTE — Transfer of Care (Signed)
Immediate Anesthesia Transfer of Care Note  Patient: Melissa Allen  Procedure(s) Performed: Procedure(s) (LRB): ROBOTIC ASSISTED TOTAL HYSTERECTOMY (N/A) TRANSVAGINAL TAPE (TVT) PROCEDURE (N/A) CYSTOSCOPY (N/A) BILATERAL SALPINGECTOMY (Bilateral)  Patient Location: PACU  Anesthesia Type: General  Level of Consciousness: awake, alert  and oriented  Airway & Oxygen Therapy: Patient Spontanous Breathing and Patient connected to nasal cannula oxygen  Post-op Assessment: Report given to PACU RN  Post vital signs: Reviewed and stable  Complications: No apparent anesthesia complications

## 2012-03-01 NOTE — Anesthesia Postprocedure Evaluation (Signed)
Anesthesia Post Note  Patient: Melissa Allen  Procedure(s) Performed: Procedure(s) (LRB): ROBOTIC ASSISTED TOTAL HYSTERECTOMY (N/A) TRANSVAGINAL TAPE (TVT) PROCEDURE (N/A) CYSTOSCOPY (N/A) BILATERAL SALPINGECTOMY (Bilateral)  Anesthesia type: General  Patient location: PACU  Post pain: Pain level controlled  Post assessment: Post-op Vital signs reviewed  Last Vitals:  Filed Vitals:   03/01/12 1030  BP: 137/79  Pulse: 99  Temp:   Resp: 19    Post vital signs: Reviewed  Level of consciousness: sedated  Complications: No apparent anesthesia complicationsfj

## 2012-03-02 ENCOUNTER — Encounter (HOSPITAL_COMMUNITY): Payer: Self-pay | Admitting: Obstetrics & Gynecology

## 2012-03-02 ENCOUNTER — Ambulatory Visit (INDEPENDENT_AMBULATORY_CARE_PROVIDER_SITE_OTHER): Payer: BC Managed Care – PPO | Admitting: Obstetrics & Gynecology

## 2012-03-02 DIAGNOSIS — Z9889 Other specified postprocedural states: Secondary | ICD-10-CM

## 2012-03-02 MED ORDER — FLAVOXATE HCL 100 MG PO TABS: 100.0000 mg | ORAL_TABLET | Freq: Three times a day (TID) | ORAL | Status: DC | PRN

## 2012-03-02 NOTE — Progress Notes (Signed)
  Subjective:    Patient ID: Sandar Krinke, female    DOB: 10-10-1965, 46 y.o.   MRN: [id number]  HPI  Mrs. Berkland is POD #1 s/p RATH/TVT exact. She is here today for a voiding trial. She voided 50 cc here and a u/s shows about 300 cc. I have offered a foley catheter now versus her going home and trying to void in the shower. She will come back in 1-2 hours to re check her bladder scan.   Review of Systems     Objective:   Physical Exam NABS, ND, NT Incisions- c/d/i       Assessment & Plan:  Voiding trial- will repeat it in 1-2 hours.   2:00 PM She was able to void a little at home but u/s here now shows about 400 cc in bladder. I will place a foley and prescribe urispas. She will come back for a voiding trial in two days.

## 2012-03-17 ENCOUNTER — Other Ambulatory Visit: Payer: Self-pay | Admitting: Family Medicine

## 2012-03-20 ENCOUNTER — Inpatient Hospital Stay (HOSPITAL_COMMUNITY): Payer: BC Managed Care – PPO

## 2012-03-20 ENCOUNTER — Inpatient Hospital Stay (HOSPITAL_COMMUNITY)
Admission: AD | Admit: 2012-03-20 | Discharge: 2012-03-20 | Disposition: A | Discharge status: Home / Self Care | Payer: BC Managed Care – PPO | Source: Ambulatory Visit | Attending: Family Medicine | Admitting: Family Medicine

## 2012-03-20 ENCOUNTER — Encounter (HOSPITAL_COMMUNITY): Payer: Self-pay | Admitting: *Deleted

## 2012-03-20 DIAGNOSIS — N949 Unspecified condition associated with female genital organs and menstrual cycle: Secondary | ICD-10-CM | POA: Insufficient documentation

## 2012-03-20 DIAGNOSIS — IMO0002 Reserved for concepts with insufficient information to code with codable children: Secondary | ICD-10-CM

## 2012-03-20 DIAGNOSIS — N925 Other specified irregular menstruation: Secondary | ICD-10-CM | POA: Insufficient documentation

## 2012-03-20 DIAGNOSIS — R109 Unspecified abdominal pain: Secondary | ICD-10-CM

## 2012-03-20 DIAGNOSIS — N938 Other specified abnormal uterine and vaginal bleeding: Secondary | ICD-10-CM | POA: Insufficient documentation

## 2012-03-20 LAB — URINE MICROSCOPIC-ADD ON

## 2012-03-20 LAB — URINALYSIS, ROUTINE W REFLEX MICROSCOPIC
Bilirubin Urine: NEGATIVE
Glucose, UA: NEGATIVE mg/dL
Nitrite: NEGATIVE
Specific Gravity, Urine: 1.025 (ref 1.005–1.030)
pH: 6 (ref 5.0–8.0)

## 2012-03-20 MED ORDER — HYDROMORPHONE HCL PF 1 MG/ML IJ SOLN
2.0000 mg | Freq: Once | INTRAMUSCULAR | Status: AC
  Administered 2012-03-20: 2 mg via INTRAMUSCULAR

## 2012-03-20 MED ORDER — DIPHENHYDRAMINE HCL 25 MG PO CAPS
25.0000 mg | ORAL_CAPSULE | Freq: Once | ORAL | Status: AC
  Administered 2012-03-20: 25 mg via ORAL
  Filled 2012-03-20: qty 1

## 2012-03-20 MED ORDER — ONDANSETRON 8 MG PO TBDP
8.0000 mg | ORAL_TABLET | Freq: Once | ORAL | Status: AC
  Administered 2012-03-20: 8 mg via ORAL
  Filled 2012-03-20: qty 1

## 2012-03-20 MED ORDER — HYDROMORPHONE HCL PF 1 MG/ML IJ SOLN
2.0000 mg | Freq: Once | INTRAMUSCULAR | Status: DC
  Filled 2012-03-20: qty 2

## 2012-03-20 MED ORDER — DIPHENHYDRAMINE HCL 50 MG/ML IJ SOLN: 25.0000 mg | Freq: Once | INTRAMUSCULAR | Status: DC

## 2012-03-20 NOTE — MAU Note (Signed)
Returned from u/s. Pain much better but nose itching some from Dilaudid. States that happened when had Dilaudid after hysterectomy. Requests Benedryl. Will notify Lilyan Punt NP.

## 2012-03-20 NOTE — Progress Notes (Signed)
Lilyan Punt NP in earlier to discuss u/s results and d/c plan.

## 2012-03-20 NOTE — MAU Note (Signed)
Had lap assist vag hyst 6/24 with bladder repair. Had alittle trouble voiding but then started voiding fine and has done well since then. Last night husband was getting out of bed and lost balance and fell back onto bed. Tried to catch himself and his elbow went into pt's lower abd. Since then pt has been having lower abd cramping. Was returning from beach this afternoon and stopped to go to [place]. HAd blood on panties and pants. Is unsure if was from bladder or vagina. Has not seen any bleeding since then.

## 2012-03-20 NOTE — Progress Notes (Signed)
Written and verbal d/c instructions given and understanding voiced. 

## 2012-03-20 NOTE — MAU Provider Note (Signed)
History     CSN: [id number]  Arrival date and time: 03/20/12 1932   First Provider Initiated Contact with Patient 03/20/12 2022      Chief Complaint  Patient presents with  . Abdominal Cramping  . Vaginal Bleeding   HPI Melissa Allen 46 y.o. Comes to MAU with red vaginal bleeding through her clothing.  Hx of lap assist vag hyst 6/24 with bladder repair.  Was out of town and last night husband was getting up out of bed.  Lost balance and fell with elbow going into client's abdomen.  Has had increasing pain since the incident and with current vaginal bleeding, client is very worried.  Has not taken any pain medication today.  Is having nausea due to pain.  OB History    Grav Para Term Preterm Abortions TAB SAB Ect Mult Living   3 3 2 1  0 0 0 0 0 3      Past Medical History  Diagnosis Date  . Allergy   . Calcaneal fracture   . Obesity (BMI 30.0-34.9)   . Asthma     recent admit to ER 01/11/12 for exac of asthma  . Hypertension   . Fibromyalgia   . Anginal pain     one episode 4-5 years ago-[facility] [place]-nonspecific-no problems since-  . PONV (postoperative nausea and vomiting)   . Kidney stones   . Duplicated ureter, right     Past Surgical History  Procedure Date  . Tonsillectomy age 67  . Bunionectomy 1995    Right  . Cholecystectomy late 1980's       . Lumbar laminectomy 10/06    L4-5, Ray  . Knee surgery     x 2, Murphy/Sue  . Lithotripsy     Multiple  . R compressed pronator     Sypher  . Tendon repair 2013  . Tendon repair 02/01/12  . Bladder suspension 03/01/2012    Procedure: TRANSVAGINAL TAPE (TVT) PROCEDURE;  Surgeon: Allie Bossier, MD;  Location: [facility];  Service: Gynecology;  Laterality: N/A;  . Cystoscopy 03/01/2012    Procedure: CYSTOSCOPY;  Surgeon: Allie Bossier, MD;  Location: [facility];  Service: Gynecology;  Laterality: N/A;  . Abdominal hysterectomy     Family History  Problem Relation Age of Onset  . Heart disease Father   . Colon  polyps Father   . Colon cancer Paternal Grandfather   . Colon cancer Maternal Grandmother   . Uterine cancer      Grandmother  . Breast cancer      Grandmother  . Ovarian cancer      Grandmother  . Diabetes      mother  . Other Neg Hx     History  Substance Use Topics  . Smoking status: Never Smoker   . Smokeless tobacco: Never Used  . Alcohol Use: 3.5 oz/week    7 drink(s) per week    Allergies:  Allergies  Allergen Reactions  . Codeine     REACTION: vomiting  . Hydrocodone Nausea And Vomiting  . Latex Hives    Prescriptions prior to admission  Medication Sig Dispense Refill  . CYMBALTA 60 MG capsule TAKE ONE CAPSULE BY MOUTH NIGHTLY AT BEDTIME  30 each  6  . flavoxATE (URISPAS) 100 MG tablet Take 1 tablet (100 mg total) by mouth 3 (three) times daily as needed.  30 tablet  0  . gabapentin (NEURONTIN) 400 MG capsule Take 400 mg by mouth at bedtime.      Marland Kitchen  ibuprofen (ADVIL,MOTRIN) 800 MG tablet Take 800 mg by mouth every 8 (eight) hours as needed.      Marland Kitchen oxyCODONE (OXYCONTIN) 10 MG 12 hr tablet Take 1 tablet (10 mg total) by mouth every 12 (twelve) hours.  30 tablet  0  . traZODone (DESYREL) 100 MG tablet TAKE ONE TABLET BY MOUTH NIGHTLY AT BEDTIME  30 tablet  0  . triamterene-hydrochlorothiazide (MAXZIDE-25) 37.5-25 MG per tablet TAKE ONE TABLET BY MOUTH ONE TIME DAILY  30 tablet  5  . Vitamins-Lipotropics (B-100) TABS Take 1 tablet by mouth daily.       Marland Kitchen albuterol (PROAIR HFA) 108 (90 BASE) MCG/ACT inhaler Inhale 2 puffs into the lungs every 4 (four) hours as needed. Shortness of breath       . fluticasone (FLONASE) 50 MCG/ACT nasal spray 2 sprays by Nasal route daily.        . Fluticasone-Salmeterol (ADVAIR DISKUS) 250-50 MCG/DOSE AEPB Inhale 1 puff into the lungs every 12 (twelve) hours as needed. For asthma      . montelukast (SINGULAIR) 10 MG tablet Take 1 tablet (10 mg total) by mouth at bedtime.  30 tablet  5  . nitroGLYCERIN (NITROSTAT) 0.4 MG SL tablet Place  0.4 mg under the tongue. 1 tablet under tongue at onset of chest pain, you may repeat every 5 minutes for up to 3 doses.       . promethazine (PHENERGAN) 25 MG tablet Take 1 tablet (25 mg total) by mouth every 6 (six) hours as needed for nausea.  10 tablet  0    Review of Systems  Constitutional: Negative for fever.  Gastrointestinal: Positive for nausea and abdominal pain. Negative for vomiting.  Genitourinary:       Vaginal bleeding   Physical Exam   Blood pressure 126/66, pulse 95, temperature 98 F (36.7 C), resp. rate 20, height 5\' 3"  (1.6 m), weight 192 lb 9.6 oz (87.363 kg), last menstrual period 12/07/2011.  Physical Exam  Nursing note and vitals reviewed. Constitutional: She is oriented to person, place, and time. She appears well-developed and well-nourished.  HENT:  Head: Normocephalic.  Eyes: EOM are normal.  Neck: Neck supple.  GI: Soft. There is tenderness.       Very tender in all quadrants with exam.  Client tearful.  Unable to fully examine.    Genitourinary:       Speculum exam done.  Very gentle exam.  Minimal bleeding seen in vagina.  Client very tender and unable to fully open speculum.  Vaginal cuff not visualized and further attempts not done as client's pain was worsening with exam.  Musculoskeletal: Normal range of motion.  Neurological: She is alert and oriented to person, place, and time.  Skin: Skin is warm and dry.  Psychiatric: She has a normal mood and affect.    MAU Course  Procedures Clinical Data: Post hysterectomy and bladder pack procedures  03/01/2012. Trauma yesterday. Bleeding.  TRANSABDOMINAL ULTRASOUND OF PELVIS  Technique: Transabdominal ultrasound examination of the pelvis was  performed including evaluation of the uterus, ovaries, adnexal  regions, and pelvic cul-de-sac.  Comparison: 12/19/2011.  Findings:  Post hysterectomy. Bladder visualized. No well-defined fluid  collection seen separate from the bladder. Evaluation for  clotted  blood or postsurgery complication is limited by ultrasound.  Ovaries not visualized.  IMPRESSION:  Post hysterectomy. Bladder visualized. No well-defined fluid  collection seen separate from the bladder.  Ovaries not visualized.   MDM 2025  Consult with Dr. Shawnie Pons re:  plan of care Dilaudid 2 mg IM given for pain and Zofran 8 mg ODT for nausea. 2250  Feeling much better from pain medication.  Nose is itching from dilaudid and Benadryl 25 mg given PO.  Assessment and Plan  Abdominal pain Vaginal bleeding S/P laparoscopic vag assisted hysterectomy on 03-01-12.  Plan Continue pain medication through the night and tomorrow. Be seen in the office on Monday if not improving Return if having vaginal bleeding or severe pain.  Marnesha Gagen 03/20/2012, 8:38 PM

## 2012-03-25 ENCOUNTER — Inpatient Hospital Stay (HOSPITAL_COMMUNITY): Payer: BC Managed Care – PPO

## 2012-03-25 ENCOUNTER — Inpatient Hospital Stay (HOSPITAL_COMMUNITY)
Admission: AD | Admit: 2012-03-25 | Discharge: 2012-03-26 | Disposition: A | Discharge status: Home / Self Care | Payer: BC Managed Care – PPO | Source: Ambulatory Visit | Attending: Family Medicine | Admitting: Family Medicine

## 2012-03-25 ENCOUNTER — Encounter (HOSPITAL_COMMUNITY): Payer: Self-pay | Admitting: *Deleted

## 2012-03-25 DIAGNOSIS — R109 Unspecified abdominal pain: Secondary | ICD-10-CM | POA: Insufficient documentation

## 2012-03-25 DIAGNOSIS — N898 Other specified noninflammatory disorders of vagina: Secondary | ICD-10-CM | POA: Insufficient documentation

## 2012-03-25 DIAGNOSIS — R1084 Generalized abdominal pain: Secondary | ICD-10-CM

## 2012-03-25 LAB — URINALYSIS, ROUTINE W REFLEX MICROSCOPIC
Glucose, UA: NEGATIVE mg/dL
Ketones, ur: NEGATIVE mg/dL
Protein, ur: NEGATIVE mg/dL
Urobilinogen, UA: 0.2 mg/dL (ref 0.0–1.0)

## 2012-03-25 MED ORDER — HYDROMORPHONE HCL PF 1 MG/ML IJ SOLN
2.0000 mg | Freq: Once | INTRAMUSCULAR | Status: AC
  Administered 2012-03-26: 2 mg via INTRAMUSCULAR
  Filled 2012-03-25: qty 2

## 2012-03-25 MED ORDER — PROMETHAZINE HCL 25 MG/ML IJ SOLN
12.5000 mg | Freq: Once | INTRAMUSCULAR | Status: AC
  Administered 2012-03-26: 12.5 mg via INTRAVENOUS
  Filled 2012-03-25: qty 1

## 2012-03-25 MED ORDER — KETOROLAC TROMETHAMINE 60 MG/2ML IM SOLN
60.0000 mg | Freq: Once | INTRAMUSCULAR | Status: AC
  Administered 2012-03-25: 60 mg via INTRAMUSCULAR
  Filled 2012-03-25: qty 2

## 2012-03-25 MED ORDER — ONDANSETRON 4 MG PO TBDP
4.0000 mg | ORAL_TABLET | Freq: Four times a day (QID) | ORAL | Status: DC | PRN
  Administered 2012-03-25: 4 mg via ORAL
  Filled 2012-03-25: qty 1

## 2012-03-25 MED ORDER — ALPRAZOLAM 0.25 MG PO TABS
0.5000 mg | ORAL_TABLET | Freq: Once | ORAL | Status: AC
  Administered 2012-03-25: 0.5 mg via ORAL
  Filled 2012-03-25 (×2): qty 1

## 2012-03-25 MED ORDER — DIPHENHYDRAMINE HCL 25 MG PO CAPS
50.0000 mg | ORAL_CAPSULE | Freq: Once | ORAL | Status: AC
  Administered 2012-03-26: 50 mg via ORAL
  Filled 2012-03-25: qty 2

## 2012-03-25 NOTE — MAU Note (Signed)
Melissa Allen IN ROOM  FOR IV  START.

## 2012-03-25 NOTE — MAU Note (Signed)
Pt s/p hysterectomy on 06/24, seen on 07/13 for vaginal bleeding. Today about 1 pm had spotting, then about 8 pm had gush of bright red bleeding, cramping and pressure.

## 2012-03-25 NOTE — MAU Provider Note (Signed)
History     CSN: [id number]  Arrival date and time: 03/25/12 2048   First Provider Initiated Contact with Patient 03/25/12 2130      Chief Complaint  Patient presents with  . Vaginal Bleeding  . Abdominal Pain   Vaginal Bleeding The patient's pertinent negatives include no genital itching. This is a new problem. The current episode started today. Associated symptoms include abdominal pain. Pertinent negatives include no chills, constipation, diarrhea, fever or vomiting. Painful intercourse: no intercourse. The symptoms are aggravated by activity. She has tried NSAIDs for the symptoms. The treatment provided mild relief. She is not sexually active. She uses nothing for contraception. Abdominal surgery: s/p RATH on 03/01/12 by Dr. Marice Potter.  Abdominal Pain Pertinent negatives include no constipation, diarrhea, fever or vomiting. Abdominal surgery: s/p RATH on 03/01/12 by Dr. Marice Potter.  Reports pain is carmpy in nature and is in the LQ's bilaterally.   Past Medical History  Diagnosis Date  . Allergy   . Calcaneal fracture   . Obesity (BMI 30.0-34.9)   . Asthma     recent admit to ER 01/11/12 for exac of asthma  . Hypertension   . Fibromyalgia   . Anginal pain     one episode 4-5 years ago-[facility] [place]-nonspecific-no problems since-  . PONV (postoperative nausea and vomiting)   . Kidney stones   . Duplicated ureter, right     Past Surgical History  Procedure Date  . Tonsillectomy age 27  . Bunionectomy 1995    Right  . Cholecystectomy late 1980's       . Lumbar laminectomy 10/06    L4-5, Ray  . Knee surgery     x 2, Murphy/Sue  . Lithotripsy     Multiple  . R compressed pronator     Sypher  . Tendon repair 2013  . Tendon repair 02/01/12  . Bladder suspension 03/01/2012    Procedure: TRANSVAGINAL TAPE (TVT) PROCEDURE;  Surgeon: Allie Bossier, MD;  Location: [facility];  Service: Gynecology;  Laterality: N/A;  . Cystoscopy 03/01/2012    Procedure: CYSTOSCOPY;  Surgeon:  Allie Bossier, MD;  Location: [facility];  Service: Gynecology;  Laterality: N/A;  . Abdominal hysterectomy     Family History  Problem Relation Age of Onset  . Heart disease Father   . Colon polyps Father   . Colon cancer Paternal Grandfather   . Colon cancer Maternal Grandmother   . Uterine cancer      Grandmother  . Breast cancer      Grandmother  . Ovarian cancer      Grandmother  . Diabetes      mother  . Other Neg Hx     History  Substance Use Topics  . Smoking status: Never Smoker   . Smokeless tobacco: Never Used  . Alcohol Use: 3.5 oz/week    7 drink(s) per week    Allergies:  Allergies  Allergen Reactions  . Codeine     REACTION: vomiting  . Hydrocodone Nausea And Vomiting  . Latex Hives    Prescriptions prior to admission  Medication Sig Dispense Refill  . albuterol (PROAIR HFA) 108 (90 BASE) MCG/ACT inhaler Inhale 2 puffs into the lungs every 4 (four) hours as needed. Shortness of breath       . CYMBALTA 60 MG capsule TAKE ONE CAPSULE BY MOUTH NIGHTLY AT BEDTIME  30 each  6  . flavoxATE (URISPAS) 100 MG tablet Take 1 tablet (100 mg total) by mouth 3 (  three) times daily as needed.  30 tablet  0  . fluticasone (FLONASE) 50 MCG/ACT nasal spray 2 sprays by Nasal route daily.        . Fluticasone-Salmeterol (ADVAIR DISKUS) 250-50 MCG/DOSE AEPB Inhale 1 puff into the lungs every 12 (twelve) hours as needed. For asthma      . gabapentin (NEURONTIN) 400 MG capsule Take 400 mg by mouth at bedtime.      Marland Kitchen ibuprofen (ADVIL,MOTRIN) 800 MG tablet Take 800 mg by mouth every 8 (eight) hours as needed.      . montelukast (SINGULAIR) 10 MG tablet Take 1 tablet (10 mg total) by mouth at bedtime.  30 tablet  5  . nitroGLYCERIN (NITROSTAT) 0.4 MG SL tablet Place 0.4 mg under the tongue. 1 tablet under tongue at onset of chest pain, you may repeat every 5 minutes for up to 3 doses.       Marland Kitchen oxyCODONE (OXYCONTIN) 10 MG 12 hr tablet Take 1 tablet (10 mg total) by mouth every 12  (twelve) hours.  30 tablet  0  . promethazine (PHENERGAN) 25 MG tablet Take 1 tablet (25 mg total) by mouth every 6 (six) hours as needed for nausea.  10 tablet  0  . traZODone (DESYREL) 100 MG tablet TAKE ONE TABLET BY MOUTH NIGHTLY AT BEDTIME  30 tablet  0  . triamterene-hydrochlorothiazide (MAXZIDE-25) 37.5-25 MG per tablet TAKE ONE TABLET BY MOUTH ONE TIME DAILY  30 tablet  5  . Vitamins-Lipotropics (B-100) TABS Take 1 tablet by mouth daily.         Review of Systems  Constitutional: Negative for fever and chills.  Respiratory: Negative for shortness of breath.   Cardiovascular: Negative for chest pain and palpitations.  Gastrointestinal: Positive for abdominal pain. Negative for vomiting, diarrhea and constipation.  Genitourinary: Positive for vaginal bleeding.  Psychiatric/Behavioral: Negative for depression. The patient is nervous/anxious.    Physical Exam   Blood pressure 144/101, pulse 113, temperature 99.2 F (37.3 C), temperature source Oral, resp. rate 20, height 5\' 3"  (1.6 m), last menstrual period 12/07/2011, SpO2 100.00%.  Physical Exam  Vitals reviewed. Constitutional: She is oriented to person, place, and time. She appears well-developed and well-nourished.  HENT:  Head: Normocephalic and atraumatic.  Eyes: No scleral icterus.  Neck: Neck supple.  Cardiovascular: Normal rate and regular rhythm.   Respiratory: Effort normal.  GI: Soft. She exhibits no distension and no mass. There is no rebound and no guarding.  Genitourinary: Vagina normal.       Vaginal cuff is intact.  There is moderate amount of bleeding in the vaginal area.  Area of TVT noted to have suture in vagina, but no evidence of erosion.   Musculoskeletal: She exhibits no edema and no tenderness.  Neurological: She is alert and oriented to person, place, and time.  Skin: Skin is warm and dry.  Psychiatric: She has a normal mood and affect.    MAU Course  Procedures  CT/Abd/Pelvis--WNL no evidence  of fluid collection IM Toradol. Required IM Dilaudid, benadryl, zofran, xanax.  Assessment and Plan  No evidence of any pathology.  Kethan Papadopoulos S 03/25/2012, 9:39 PM

## 2012-03-26 ENCOUNTER — Encounter: Payer: Self-pay | Admitting: Obstetrics & Gynecology

## 2012-03-26 ENCOUNTER — Ambulatory Visit (INDEPENDENT_AMBULATORY_CARE_PROVIDER_SITE_OTHER): Payer: BC Managed Care – PPO | Admitting: Obstetrics & Gynecology

## 2012-03-26 VITALS — BP 120/91 | HR 114 | Ht 64.0 in | Wt 188.0 lb

## 2012-03-26 DIAGNOSIS — Z9889 Other specified postprocedural states: Secondary | ICD-10-CM

## 2012-03-26 MED ORDER — IOHEXOL 300 MG/ML  SOLN
100.0000 mL | Freq: Once | INTRAMUSCULAR | Status: AC | PRN
  Administered 2012-03-26: 100 mL via INTRAVENOUS

## 2012-03-26 NOTE — MAU Note (Signed)
FEELS BETTER- NO NAUSEA-  HAS STARTED DRINKING   CONTRAST

## 2012-03-26 NOTE — MAU Note (Signed)
DRINKING 2ND CUP OF CONTRAST

## 2012-03-26 NOTE — MAU Note (Signed)
BACK FROM CT

## 2012-03-26 NOTE — Progress Notes (Signed)
  Subjective:    Patient ID: Melissa Allen, female    DOB: 15-Jan-1966, 46 y.o.   MRN: [id number]  HPI  Melissa Allen is now 3 1/2 weeks status post RATH/mid urethral sling. She is here today for follow up after a visit to the MAU last night because of what she described as heavy vaginal bleeding. A CT was normal last night. Today the vaginal bleeding has stopped.  Review of Systems     Objective:   Physical Exam  Well-healed abdominal incisions, vaginal cuff, and vaginal mucosa at site of sling. The vicryl in the sling area is dissolving. There is not a speck of blood noted. Bimanual exam is entirely normal, no weakness at vaginal cuff.      Assessment & Plan:   Post op stable. RTC 2 weeks/prn sooner.

## 2012-03-29 ENCOUNTER — Encounter: Payer: BC Managed Care – PPO | Admitting: Obstetrics & Gynecology

## 2012-04-08 ENCOUNTER — Encounter: Payer: Self-pay | Admitting: Obstetrics & Gynecology

## 2012-04-08 ENCOUNTER — Ambulatory Visit (INDEPENDENT_AMBULATORY_CARE_PROVIDER_SITE_OTHER): Payer: BC Managed Care – PPO | Admitting: Obstetrics & Gynecology

## 2012-04-08 ENCOUNTER — Encounter: Payer: BC Managed Care – PPO | Admitting: Obstetrics & Gynecology

## 2012-04-08 VITALS — BP 111/84 | HR 94 | Ht 63.0 in | Wt 189.0 lb

## 2012-04-08 DIAGNOSIS — Z9889 Other specified postprocedural states: Secondary | ICD-10-CM

## 2012-04-08 DIAGNOSIS — Z09 Encounter for follow-up examination after completed treatment for conditions other than malignant neoplasm: Secondary | ICD-10-CM

## 2012-04-08 NOTE — Progress Notes (Signed)
  Subjective:    Patient ID: Melissa Allen, female    DOB: 05-17-1966, 46 y.o.   MRN: [id number]  HPI  Mrs. Purdie is now 5 1/2 weeks status post RATH/TVT/BL salpingectomy. 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 Has not has sex yet, but husband has PO 6weeks marked on calendar. She has returned to work at her "second job".   Review of Systems     Objective:   Physical Exam  Well healed cuff, incisions. Benign bimanual exam.      Assessment & Plan:  Post op stable. May return to sex. Function next week. RTC 1 year.

## 2012-04-22 ENCOUNTER — Other Ambulatory Visit: Payer: Self-pay | Admitting: Dermatology

## 2012-04-29 ENCOUNTER — Other Ambulatory Visit: Payer: Self-pay | Admitting: Family Medicine

## 2012-04-29 NOTE — Telephone Encounter (Signed)
Yes please schedule CPX but ok to refill BP meds since she has been seen here.

## 2012-04-29 NOTE — Telephone Encounter (Signed)
Pt has not been seen here recently, except for acute visits, has been seeing gyn.  Does pt need to schedule office visit with you?

## 2012-04-30 NOTE — Telephone Encounter (Signed)
Advised patient.  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  Scheduled appt for 9/30 for 30 minute follow up.  Medicine refilled.

## 2012-04-30 NOTE — Telephone Encounter (Signed)
Left message asking pt to call back. 

## 2012-05-03 ENCOUNTER — Other Ambulatory Visit: Payer: Self-pay | Admitting: *Deleted

## 2012-05-03 MED ORDER — DULOXETINE HCL 60 MG PO CPEP: ORAL_CAPSULE | ORAL | Status: DC

## 2012-06-07 ENCOUNTER — Ambulatory Visit (INDEPENDENT_AMBULATORY_CARE_PROVIDER_SITE_OTHER): Payer: BC Managed Care – PPO | Admitting: Family Medicine

## 2012-06-07 ENCOUNTER — Encounter: Payer: Self-pay | Admitting: Family Medicine

## 2012-06-07 VITALS — BP 130/80 | HR 72 | Temp 98.3°F

## 2012-06-07 DIAGNOSIS — IMO0001 Reserved for inherently not codable concepts without codable children: Secondary | ICD-10-CM

## 2012-06-07 MED ORDER — GABAPENTIN 400 MG PO CAPS: 400.0000 mg | ORAL_CAPSULE | Freq: Every day | ORAL | Status: DC

## 2012-06-07 NOTE — Patient Instructions (Addendum)
Good to see you. Please increase your gabapentin to at least 400 mg at bedtime, you can take this up to three times daily.  Please call your insurance company to find about a nebulizer machine.

## 2012-06-07 NOTE — Progress Notes (Signed)
Subjective:    Patient ID: Melissa Allen, female    DOB: 1966/06/16, 46 y.o.   MRN: [id number]  HPI  Very pleasant 46 yo female here for follow up Fibromyalgia.  Had been followed by rheumatology for years.  Can no longer afford co pay and has not been back in years.  On Gabapentin 200 mg qhs, Trazadone 100 mg qhs and Cymbalta 60 mg daily. Lately she feels her fibromyalgia is getting worse- "I hurt everywhere." Has never been on any supplements or any other medication for fibromyalgia.  Patient Active Problem List  Diagnosis  . ANXIETY  . DEPRESSION  . HYPERTENSION  . ALLERGIC RHINITIS  . ASTHMA  . GERD  . REFLUX GASTRITIS  . HEPATIC CYST  . PANCREATITIS, CHRONIC  . RENAL CALCULUS  . CONTACT DERMATITIS&OTHER ECZEMA DUE TO PLANTS  . ARTHRITIS  . FIBROMYALGIA  . HEADACHE  . PALPITATIONS  . NEPHROLITHIASIS, HX OF   Past Medical History  Diagnosis Date  . Allergy   . Calcaneal fracture   . Obesity (BMI 30.0-34.9)   . Asthma     recent admit to ER 01/11/12 for exac of asthma  . Hypertension   . Fibromyalgia   . Anginal pain     one episode 4-5 years ago-[facility] [place]-nonspecific-no problems since-  . PONV (postoperative nausea and vomiting)   . Kidney stones   . Duplicated ureter, right    Past Surgical History  Procedure Date  . Tonsillectomy age 72  . Bunionectomy 1995    Right  . Cholecystectomy late 1980's       . Lumbar laminectomy 10/06    L4-5, Ray  . Knee surgery     x 2, Murphy/Sue  . Lithotripsy     Multiple  . R compressed pronator     Sypher  . Tendon repair 2013  . Tendon repair 02/01/12  . Bladder suspension 03/01/2012    Procedure: TRANSVAGINAL TAPE (TVT) PROCEDURE;  Surgeon: Allie Bossier, MD;  Location: [facility];  Service: Gynecology;  Laterality: N/A;  . Cystoscopy 03/01/2012    Procedure: CYSTOSCOPY;  Surgeon: Allie Bossier, MD;  Location: [facility];  Service: Gynecology;  Laterality: N/A;  . Abdominal hysterectomy    History    Substance Use Topics  . Smoking status: Never Smoker   . Smokeless tobacco: Never Used  . Alcohol Use: 3.5 oz/week    7 drink(s) per week   Family History  Problem Relation Age of Onset  . Heart disease Father   . Colon polyps Father   . Colon cancer Paternal Grandfather   . Colon cancer Maternal Grandmother   . Uterine cancer      Grandmother  . Breast cancer      Grandmother  . Ovarian cancer      Grandmother  . Diabetes      mother  . Other Neg Hx    Allergies  Allergen Reactions  . Codeine     REACTION: vomiting  . Hydrocodone Nausea And Vomiting  . Latex Hives   Current Outpatient Prescriptions on File Prior to Visit  Medication Sig Dispense Refill  . albuterol (PROAIR HFA) 108 (90 BASE) MCG/ACT inhaler Inhale 2 puffs into the lungs every 4 (four) hours as needed. Shortness of breath       . DULoxetine (CYMBALTA) 60 MG capsule Take one by mouth nightly at bedtime.  30 capsule  1  . fluticasone (FLONASE) 50 MCG/ACT nasal spray 2 sprays by Nasal route  daily.        . Fluticasone-Salmeterol (ADVAIR DISKUS) 250-50 MCG/DOSE AEPB Inhale 1 puff into the lungs every 12 (twelve) hours as needed. For asthma      . ibuprofen (ADVIL,MOTRIN) 800 MG tablet Take 800 mg by mouth every 8 (eight) hours as needed.      . montelukast (SINGULAIR) 10 MG tablet Take 1 tablet (10 mg total) by mouth at bedtime.  30 tablet  5  . nitroGLYCERIN (NITROSTAT) 0.4 MG SL tablet Place 0.4 mg under the tongue. 1 tablet under tongue at onset of chest pain, you may repeat every 5 minutes for up to 3 doses.       . OXYCONTIN 10 MG 12 hr tablet       . traZODone (DESYREL) 100 MG tablet TAKE ONE TABLET BY MOUTH NIGHTLY AT BEDTIME  30 tablet  0  . triamterene-hydrochlorothiazide (MAXZIDE-25) 37.5-25 MG per tablet TAKE ONE TABLET BY MOUTH ONE TIME DAILY  30 tablet  1  . Vitamins-Lipotropics (B-100) TABS Take 1 tablet by mouth daily.        The PMH, PSH, Social History, Family History, Medications, and  allergies have been reviewed in [facility], and have been updated if relevant.    Review of Systems    See ROS Objective:   Physical Exam BP 130/80  Pulse 72  Temp 98.3 F (36.8 C)  LMP 12/07/2011 Gen:  Alert, pleasant, NAD Resp:  CTA bilaterally CVS: RRR Psych:  Alert, pleasant, not anxious or depressed appearing     Assessment & Plan:   1. FIBROMYALGIA    Deteriorated. Given Dr. Fatima Sanger handout about fibromyalgia and supplements. Also on low dose of Gabapentin and could titrate up dose. The patient indicates understanding of these issues and agrees with the plan.

## 2012-06-11 ENCOUNTER — Other Ambulatory Visit: Payer: Self-pay | Admitting: Family Medicine

## 2012-06-24 ENCOUNTER — Encounter: Payer: Self-pay | Admitting: Obstetrics & Gynecology

## 2012-06-28 ENCOUNTER — Other Ambulatory Visit: Payer: Self-pay | Admitting: Family Medicine

## 2012-06-30 ENCOUNTER — Other Ambulatory Visit: Payer: Self-pay | Admitting: Family Medicine

## 2012-06-30 NOTE — Telephone Encounter (Signed)
Refill request for Cymbalta 60 mg. Ok to refill?

## 2012-07-08 ENCOUNTER — Other Ambulatory Visit: Payer: Self-pay | Admitting: Family Medicine

## 2012-07-08 NOTE — Telephone Encounter (Signed)
Electronic refill request

## 2012-07-09 NOTE — Telephone Encounter (Signed)
Rx called in as prescribed 

## 2012-07-09 NOTE — Telephone Encounter (Signed)
Okay to refill for a year 

## 2012-07-27 ENCOUNTER — Other Ambulatory Visit: Payer: Self-pay | Admitting: Family Medicine

## 2012-08-28 ENCOUNTER — Other Ambulatory Visit: Payer: Self-pay | Admitting: Family Medicine

## 2012-09-28 ENCOUNTER — Encounter: Payer: Self-pay | Admitting: Family Medicine

## 2012-09-28 ENCOUNTER — Ambulatory Visit (INDEPENDENT_AMBULATORY_CARE_PROVIDER_SITE_OTHER): Payer: BC Managed Care – PPO | Admitting: Family Medicine

## 2012-09-28 VITALS — BP 120/74 | HR 100 | Temp 98.7°F | Ht 63.0 in | Wt 187.0 lb

## 2012-09-28 DIAGNOSIS — R0789 Other chest pain: Secondary | ICD-10-CM | POA: Insufficient documentation

## 2012-09-28 DIAGNOSIS — R071 Chest pain on breathing: Secondary | ICD-10-CM

## 2012-09-28 MED ORDER — OXYCODONE-ACETAMINOPHEN 5-325 MG PO TABS: 1.0000 | ORAL_TABLET | Freq: Every evening | ORAL | Status: DC | PRN

## 2012-09-28 MED ORDER — MELOXICAM 15 MG PO TABS: 15.0000 mg | ORAL_TABLET | Freq: Every day | ORAL | Status: DC

## 2012-09-28 MED ORDER — LIDOCAINE 5 % EX PTCH: 1.0000 | MEDICATED_PATCH | CUTANEOUS | Status: DC

## 2012-09-28 NOTE — Assessment & Plan Note (Signed)
Possible rib fracture following injury. Pt hypersensitive to pain given fibromyalgia history. Will use NSAID during day for pain, percocet at night (cannot tolerate hydrocodone and codeine, tramadol not helpful.)  Lidoderm patch if pain still not controlled.Follow up with Dr. Dayton Martes if not improving.

## 2012-09-28 NOTE — Progress Notes (Signed)
  Subjective:    Patient ID: Melissa Allen, female    DOB: 1966/05/21, 47 y.o.   MRN: [id number]  HPI  47 year old pre-menopausal female of Dr. Dayton Martes ( had hysterectomy but still has ovaries) with fibroyalgia presents following fall against dresser  On 1/16. Tripped on something on the floor and hit left side. Saw bruising.  Pain with deep breaths, moving arms and twisting at waist. Pain scale 8/10.  Has been using advil for pain. Tried tramadol which did not help with pain. Codeine makes her nauseous.  No family history of osteoporosis. On cymbalta, gabapentin and trazodone ... For fibro, still poor control.    Review of Systems  Constitutional: Negative for fever and fatigue.  HENT: Negative for ear pain.   Eyes: Negative for pain.  Respiratory: Negative for chest tightness and shortness of breath.   Cardiovascular: Negative for chest pain, palpitations and leg swelling.  Gastrointestinal: Negative for abdominal pain.  Genitourinary: Negative for dysuria.       Objective:   Physical Exam  Constitutional: Vital signs are normal. She appears well-developed and well-nourished. She is cooperative.  Non-toxic appearance. She does not appear ill. No distress.  HENT:  Head: Normocephalic.  Right Ear: Hearing, tympanic membrane, external ear and ear canal normal. Tympanic membrane is not erythematous, not retracted and not bulging.  Left Ear: Hearing, tympanic membrane, external ear and ear canal normal. Tympanic membrane is not erythematous, not retracted and not bulging.  Nose: No mucosal edema or rhinorrhea. Right sinus exhibits no maxillary sinus tenderness and no frontal sinus tenderness. Left sinus exhibits no maxillary sinus tenderness and no frontal sinus tenderness.  Mouth/Throat: Uvula is midline, oropharynx is clear and moist and mucous membranes are normal.  Eyes: Conjunctivae normal, EOM and lids are normal. Pupils are equal, round, and reactive to light. No foreign bodies  found.  Neck: Trachea normal and normal range of motion. Neck supple. Carotid bruit is not present. No mass and no thyromegaly present.  Cardiovascular: Normal rate, regular rhythm, S1 normal, S2 normal, normal heart sounds, intact distal pulses and normal pulses.  Exam reveals no gallop and no friction rub.   No murmur heard. Pulmonary/Chest: Effort normal and breath sounds normal. Not tachypneic. No respiratory distress. She has no decreased breath sounds. She has no wheezes. She has no rhonchi. She has no rales.       Diffuse ttp over lateral chest wall, PT WEEPING in PAIN during palpation  Abdominal: Soft. Normal appearance and bowel sounds are normal. There is no tenderness.  Neurological: She is alert.  Skin: Skin is warm, dry and intact. No rash noted.       Bruise on left abdomen  Psychiatric: Her speech is normal and behavior is normal. Judgment and thought content normal. Her mood appears not anxious. Cognition and memory are normal. She does not exhibit a depressed mood.          Assessment & Plan:

## 2012-09-28 NOTE — Patient Instructions (Addendum)
Work on deep breathing. Stretch as able , ice over area. Stop advil. Start meloxicam during the day, can use percocet for severe pain or at night. If pain not controlled then can use lidoderm patches over tender area. Follow up with Dr. Dayton Martes if not improvoing in 2-3 weeks.

## 2012-10-22 ENCOUNTER — Other Ambulatory Visit: Payer: Self-pay | Admitting: Family Medicine

## 2012-11-01 ENCOUNTER — Ambulatory Visit (INDEPENDENT_AMBULATORY_CARE_PROVIDER_SITE_OTHER): Payer: BC Managed Care – PPO | Admitting: Family Medicine

## 2012-11-01 ENCOUNTER — Encounter: Payer: Self-pay | Admitting: Family Medicine

## 2012-11-01 VITALS — BP 124/90 | HR 112 | Temp 98.1°F | Wt 188.0 lb

## 2012-11-01 DIAGNOSIS — J45901 Unspecified asthma with (acute) exacerbation: Secondary | ICD-10-CM | POA: Insufficient documentation

## 2012-11-01 DIAGNOSIS — J45909 Unspecified asthma, uncomplicated: Secondary | ICD-10-CM

## 2012-11-01 MED ORDER — ALBUTEROL SULFATE (2.5 MG/3ML) 0.083% IN NEBU
2.5000 mg | INHALATION_SOLUTION | Freq: Once | RESPIRATORY_TRACT | Status: AC
  Administered 2012-11-01: 2.5 mg via RESPIRATORY_TRACT

## 2012-11-01 MED ORDER — ALBUTEROL SULFATE HFA 108 (90 BASE) MCG/ACT IN AERS: 2.0000 | INHALATION_SPRAY | RESPIRATORY_TRACT | Status: DC | PRN

## 2012-11-01 MED ORDER — IPRATROPIUM BROMIDE 0.02 % IN SOLN
0.5000 mg | Freq: Once | RESPIRATORY_TRACT | Status: AC
  Administered 2012-11-01: 0.5 mg via RESPIRATORY_TRACT

## 2012-11-01 MED ORDER — PREDNISONE 20 MG PO TABS: ORAL_TABLET | ORAL | Status: DC

## 2012-11-01 MED ORDER — BUDESONIDE-FORMOTEROL FUMARATE 160-4.5 MCG/ACT IN AERO: 2.0000 | INHALATION_SPRAY | Freq: Two times a day (BID) | RESPIRATORY_TRACT | Status: DC

## 2012-11-01 NOTE — Assessment & Plan Note (Addendum)
Has not been using advair 2/2 taste intolerance. Trial of symbicort, discussed breo. Discussed importance of controller med. Refilled albuterol.

## 2012-11-01 NOTE — Patient Instructions (Addendum)
For asthma flare - treat with steroid course.  Use albuterol 2 puffs every 4-6 hours for next 2 days. Watch for fever > 101, worsening productive cough.   If short of breath or worsening cough despite treatment, please seek urgent care. Once feeling better, try symbicort.  Very important to take daily controller medicine to help prevent flares.

## 2012-11-01 NOTE — Assessment & Plan Note (Addendum)
Very tight on presentation today - treated with albuterol/atrovent nebulizer. Afterwards, improved air movement.   Treat with prednisone course as well as regular use of albuterol. Nontoxic, no fevers, no productive cough, no indication of bacterial infection so no indication for abx. Discussed importance of controller medication - see above.

## 2012-11-01 NOTE — Progress Notes (Signed)
  Subjective:    Patient ID: Melissa Allen, female    DOB: 1966/02/03, 46 y.o.   MRN: [id number]  HPI CC: asthma flare  47 yo with h/o allergic rhinitis and asthma and ER visit for asthma exac 01/2012 presents with 3d h/o what feels like asthma flare - cough, wheezy, short of breath, chest tightness.  In am with phlegm, trouble coughing it up.  Out in yard this weekend - thinks asthma flared from this.  Asthma year round.  Denies fevers/chills, no sinus congestion or productive cough.  Used albuterol yesterday and again twice this morning.  Usually rarely uses.  Doesn't use advair prescribed 2/2 doesn't like metallic taste. Takes singulair nightly.  No smokers at home.  No sick contacts recently.  Past Medical History  Diagnosis Date  . Allergy   . Calcaneal fracture   . Obesity (BMI 30.0-34.9)   . Asthma     recent admit to ER 01/11/12 for exac of asthma  . Hypertension   . Fibromyalgia   . Anginal pain     one episode 4-5 years ago- [facility] [place]-nonspecific-no problems since-  . PONV (postoperative nausea and vomiting)   . Kidney stones   . Duplicated ureter, right      Review of Systems Per HPI    Objective:   Physical Exam  Nursing note and vitals reviewed. Constitutional: She appears well-developed and well-nourished. No distress.  HENT:  Head: Normocephalic and atraumatic.  Right Ear: Hearing, tympanic membrane, external ear and ear canal normal.  Left Ear: Hearing, tympanic membrane, external ear and ear canal normal.  Nose: Nose normal. No mucosal edema or rhinorrhea. Right sinus exhibits no maxillary sinus tenderness and no frontal sinus tenderness. Left sinus exhibits no maxillary sinus tenderness and no frontal sinus tenderness.  Mouth/Throat: Uvula is midline, oropharynx is clear and moist and mucous membranes are normal. No oropharyngeal exudate, posterior oropharyngeal edema, posterior oropharyngeal erythema or tonsillar abscesses.  Eyes:  Conjunctivae and EOM are normal. Pupils are equal, round, and reactive to light. No scleral icterus.  Neck: Normal range of motion. Neck supple.  Cardiovascular: Normal rate, regular rhythm, normal heart sounds and intact distal pulses.   No murmur heard. Pulmonary/Chest: No respiratory distress. She has decreased breath sounds. She has no wheezes. She has no rhonchi. She has no rales.  Very tight, poor air movement.  Lymphadenopathy:    She has no cervical adenopathy.  Skin: Skin is warm and dry. No rash noted.   After first breathing treatment, improved air movement but still tight. After second breathing treatment, improved air movement, breathing easier.  Able to speak in complete sentences.    Assessment & Plan:

## 2012-11-01 NOTE — Addendum Note (Signed)
Addended by: Josph Macho A on: 11/01/2012 02:07 PM   Modules accepted: Orders

## 2012-11-09 ENCOUNTER — Other Ambulatory Visit: Payer: Self-pay | Admitting: Family Medicine

## 2012-11-28 ENCOUNTER — Other Ambulatory Visit: Payer: Self-pay | Admitting: Family Medicine

## 2012-11-29 ENCOUNTER — Encounter: Payer: Self-pay | Admitting: Family Medicine

## 2012-11-29 ENCOUNTER — Ambulatory Visit (INDEPENDENT_AMBULATORY_CARE_PROVIDER_SITE_OTHER): Payer: BC Managed Care – PPO | Admitting: Family Medicine

## 2012-11-29 VITALS — BP 132/90 | HR 88 | Temp 98.1°F | Wt 187.0 lb

## 2012-11-29 DIAGNOSIS — J45909 Unspecified asthma, uncomplicated: Secondary | ICD-10-CM

## 2012-11-29 DIAGNOSIS — R030 Elevated blood-pressure reading, without diagnosis of hypertension: Secondary | ICD-10-CM | POA: Insufficient documentation

## 2012-11-29 MED ORDER — POCKET SPACER DEVI: Status: DC

## 2012-11-29 NOTE — Progress Notes (Signed)
  Subjective:    Patient ID: Melissa Allen, female    DOB: 12-May-1966, 47 y.o.   MRN: [id number]  47 yo with h/o allergic rhinitis and asthma here for ? Elevated BP.  Asthma has been worse these past few months.  Saw Dr. Reece Agar last month- treated with course of prednisone and switched to symbicort from Advair.  Having to use her albuterol quite regularly and has had some tachycardia.  Today at school, felt "swimmy headed."  RN at school checked bp and was 150/110 so she came in.  Feels better now.    Denies fevers/chills, no sinus congestion or productive cough.   No smokers at home.  No sick contacts recently.  Past Medical History  Diagnosis Date  . Allergy   . Calcaneal fracture   . Obesity (BMI 30.0-34.9)   . Asthma     recent admit to ER 01/11/12 for exac of asthma  . Hypertension   . Fibromyalgia   . Anginal pain     one episode 4-5 years ago-[facility] [place]-nonspecific-no problems since-  . PONV (postoperative nausea and vomiting)   . Kidney stones   . Duplicated ureter, right      Review of Systems Per HPI    Objective:   Physical Exam  BP 132/90  Pulse 88  Temp(Src) 98.1 F (36.7 C)  Wt 187 lb (84.823 kg)  BMI 33.13 kg/m2  LMP 12/07/2011  Constitutional: She appears well-developed and well-nourished. No distress.  HENT:  Head: Normocephalic and atraumatic.  Right Ear: Hearing, tympanic membrane, external ear and ear canal normal.  Left Ear: Hearing, tympanic membrane, external ear and ear canal normal.  Nose: Nose normal. No mucosal edema or rhinorrhea. Right sinus exhibits no maxillary sinus tenderness and no frontal sinus tenderness. Left sinus exhibits no maxillary sinus tenderness and no frontal sinus tenderness.  Mouth/Throat: Uvula is midline, oropharynx is clear and moist and mucous membranes are normal. No oropharyngeal exudate, posterior oropharyngeal edema, posterior oropharyngeal erythema or tonsillar abscesses.  Eyes: Conjunctivae and EOM  are normal. Pupils are equal, round, and reactive to light. No scleral icterus.  Neck: Normal range of motion. Neck supple.  Cardiovascular: Normal rate, regular rhythm, normal heart sounds and intact distal pulses.   No murmur heard. Pulmonary/Chest: No respiratory distress. She has no wheezes. She has no rhonchi. She has no rales.  Lymphadenopathy:    She has no cervical adenopathy.  Skin: Skin is warm and dry. No rash noted.  After first breathing treatment, improved air movement but still tight. After second breathing treatment, improved air movement, breathing easier.  Able to speak in complete sentences.    Assessment & Plan:  1. ASTHMA Lungs clear today but remains difficult to control.  Will refer to pulmonary.   - Ambulatory referral to Pulmonology  2. Elevated blood pressure (not hypertension) Normotensive today. Likely due to increased use of rescue inhaler.  She will check BP at school again tomorrow and call me with an update.

## 2012-11-29 NOTE — Patient Instructions (Addendum)
Good to see you. Shirlee Limerick will call you with your lung doctor appointment.

## 2012-11-29 NOTE — Addendum Note (Signed)
Addended by: Dianne Dun on: 11/29/2012 12:09 PM   Modules accepted: Level of Service

## 2012-12-06 ENCOUNTER — Telehealth: Payer: Self-pay | Admitting: *Deleted

## 2012-12-06 NOTE — Telephone Encounter (Signed)
Pt called stating that her BP is 168/110 at work today.  She is asking what she should do.

## 2012-12-06 NOTE — Telephone Encounter (Signed)
Patient didn't use albuterol today.  She has appt scheduled for tomorrow at 10:45.

## 2012-12-06 NOTE — Telephone Encounter (Signed)
Did she use more albuterol today?  This can often elevated heart rate and blood pressure.  Was normotensive when we saw her here last week.  I am hesitant to increase her BP medication and cause her BP to go too low if this is due to medication.

## 2012-12-07 ENCOUNTER — Encounter: Payer: Self-pay | Admitting: Family Medicine

## 2012-12-07 ENCOUNTER — Ambulatory Visit (INDEPENDENT_AMBULATORY_CARE_PROVIDER_SITE_OTHER): Payer: BC Managed Care – PPO | Admitting: Family Medicine

## 2012-12-07 VITALS — BP 140/100 | HR 72 | Temp 98.1°F | Wt 184.0 lb

## 2012-12-07 DIAGNOSIS — R030 Elevated blood-pressure reading, without diagnosis of hypertension: Secondary | ICD-10-CM

## 2012-12-07 MED ORDER — AMLODIPINE BESYLATE 5 MG PO TABS: 5.0000 mg | ORAL_TABLET | Freq: Every day | ORAL | Status: DC

## 2012-12-07 NOTE — Progress Notes (Signed)
  Subjective:    Patient ID: Melissa Allen, female    DOB: 1966-03-30, 47 y.o.   MRN: [id number]  47 yo with h/o allergic rhinitis and asthma here for ? Elevated BP.  On 3/27 BP normal here.  RN at worked checked it yesterday and it was 168/110.  She has a headache, feels swimmy headed.  No smokers at home.  No sick contacts recently.  Asthma has been worse over this past year, but has not needed her albuterol inhaler recently.  She is taking Triamterene- HCTZ 37.5-25 mg daily.  Lisinopril gave her dry cough in past.    No CP or SOB.  Past Medical History  Diagnosis Date  . Allergy   . Calcaneal fracture   . Obesity (BMI 30.0-34.9)   . Asthma     recent admit to ER 01/11/12 for exac of asthma  . Hypertension   . Fibromyalgia   . Anginal pain     one episode 4-5 years ago-[facility] [place]-nonspecific-no problems since-  . PONV (postoperative nausea and vomiting)   . Kidney stones   . Duplicated ureter, right      Review of Systems Per HPI    Objective:   Physical Exam  BP 140/100  Pulse 72  Temp(Src) 98.1 F (36.7 C)  Wt 184 lb (83.462 kg)  BMI 32.6 kg/m2  LMP 12/07/2011  Constitutional: She appears well-developed and well-nourished. No distress.  HENT:  Head: Normocephalic and atraumatic.  Right Ear: Hearing, tympanic membrane, external ear and ear canal normal.  Left Ear: Hearing, tympanic membrane, external ear and ear canal normal.  Nose: Nose normal. No mucosal edema or rhinorrhea. Right sinus exhibits no maxillary sinus tenderness and no frontal sinus tenderness. Left sinus exhibits no maxillary sinus tenderness and no frontal sinus tenderness.  Mouth/Throat: Uvula is midline, oropharynx is clear and moist and mucous membranes are normal. No oropharyngeal exudate, posterior oropharyngeal edema, posterior oropharyngeal erythema or tonsillar abscesses.  Eyes: Conjunctivae and EOM are normal. Pupils are equal, round, and reactive to light. No scleral  icterus.  Neck: Normal range of motion. Neck supple.  Cardiovascular: Normal rate, regular rhythm, normal heart sounds and intact distal pulses.   No murmur heard. Pulmonary/Chest: No respiratory distress. She has no wheezes. She has no rhonchi. She has no rales.  Lymphadenopathy:    She has no cervical adenopathy.  Skin: Skin is warm and dry. No rash noted.  After first breathing treatment, improved air movement but still tight. After second breathing treatment, improved air movement, breathing easier.  Able to speak in complete sentences.    Assessment & Plan:  1. ASTHMA Lungs clear today but remains difficult to control. She has appt with Dr. Sherene Sires this month.   2. Elevated blood pressure (not hypertension) Persistently elevated. Will add norvasc 5 mg daily.  She will have nurse check her BP and call me with readings. The patient indicates understanding of these issues and agrees with the plan.

## 2012-12-07 NOTE — Patient Instructions (Addendum)
We are adding amlodopine 5 mg daily (or norvasc) to your medications. Please have nurse at school check it- a few times a week for two weeks.   Call me with the readings.  Ranges we are looking are in 140s (to low 150s)/ 90s.

## 2012-12-13 ENCOUNTER — Encounter: Payer: Self-pay | Admitting: *Deleted

## 2012-12-13 ENCOUNTER — Telehealth: Payer: Self-pay

## 2012-12-13 ENCOUNTER — Encounter: Payer: Self-pay | Admitting: Family Medicine

## 2012-12-13 ENCOUNTER — Ambulatory Visit (INDEPENDENT_AMBULATORY_CARE_PROVIDER_SITE_OTHER): Payer: BC Managed Care – PPO | Admitting: Family Medicine

## 2012-12-13 VITALS — BP 128/80 | HR 94 | Temp 98.5°F | Wt 186.8 lb

## 2012-12-13 DIAGNOSIS — I10 Essential (primary) hypertension: Secondary | ICD-10-CM

## 2012-12-13 MED ORDER — LOSARTAN POTASSIUM 50 MG PO TABS: 50.0000 mg | ORAL_TABLET | Freq: Every day | ORAL | Status: DC

## 2012-12-13 NOTE — Assessment & Plan Note (Signed)
Persistently elevated blood pressure readings along with associated headache and lightheadedness. Has been on amlodipine 5mg  for 1 week, with no effect on BP.  Takes both bp meds in am. Will change amlodipine to losartan 50mg  daily. Return in 1 week for labwork, in 2-3 weeks for f/u with PCP. S/p hysterectomy.  Lisinopril caused dry cough. Pt agrees with plan.

## 2012-12-13 NOTE — Patient Instructions (Addendum)
I'd like [place] to stop amlodipine, start losartan 50mg  daily - sent into pharmacy. Lots of water, lots of fruits/vegetables for potassium rich foods. Return in 1 week for blood work, in 3 weeks for follow up with Dr. Dayton Martes. Good to see you today, call us with questions.

## 2012-12-13 NOTE — Telephone Encounter (Signed)
Will see today.  

## 2012-12-13 NOTE — Progress Notes (Signed)
  Subjective:    Patient ID: Melissa Allen, female    DOB: 07/14/1966, 47 y.o.   MRN: [id number]  HPI CC: HTN, HA  For last few weeks, BP has been trending upwards recently.  Last week started on amlodipine 5mg  daily.  Tolerating med well.  This morning BP 163/112.  At home and at school checks blood pressure - consistently 140-170s/95-110.  HA - associated with dizziness described as lightheadedness "swimmy headedness".  No vertigo or presyncope. Denies chest pain, leg swelling. Some SOB and chest tightness at baseline with asthma.  Good water, fruits/vegetables daily.  Avoids salt/sodium.  BP Readings from Last 3 Encounters:  12/13/12 138/90  12/07/12 140/100  11/29/12 132/90   Past Medical History  Diagnosis Date  . Allergy   . Calcaneal fracture   . Obesity (BMI 30.0-34.9)   . Asthma     recent admit to ER 01/11/12 for exac of asthma  . Hypertension   . Fibromyalgia   . Anginal pain     one episode 4-5 years ago-[facility] [place]-nonspecific-no problems since-  . PONV (postoperative nausea and vomiting)   . Kidney stones   . Duplicated ureter, right     Review of Systems Per HPI    Objective:   Physical Exam  Nursing note and vitals reviewed. Constitutional: She appears well-developed and well-nourished. No distress.  HENT:  Head: Normocephalic and atraumatic.  Mouth/Throat: Oropharynx is clear and moist. No oropharyngeal exudate.  Eyes: Conjunctivae and EOM are normal. Pupils are equal, round, and reactive to light. No scleral icterus.  Neck: Normal range of motion. Neck supple. Carotid bruit is not present. No thyromegaly present.  Cardiovascular: Normal rate, regular rhythm, normal heart sounds and intact distal pulses.   No murmur heard. Pulmonary/Chest: Effort normal and breath sounds normal. No respiratory distress. She has no wheezes. She has no rales.  Abdominal: Soft. Bowel sounds are normal. There is no tenderness.  No abd/renal bruit   Musculoskeletal: She exhibits no edema.  Lymphadenopathy:    She has no cervical adenopathy.  Skin: Skin is warm and dry. No rash noted.  Psychiatric: She has a normal mood and affect.      Assessment & Plan:

## 2012-12-13 NOTE — Telephone Encounter (Signed)
Pt seen 12/07/12; today BP 163/112; pt having dizziness,h/a on and off for several days but worse this AM. Pt taking Maxzide and amlodipine as instructed, no missed doses.No CP but some SOB but pt said she has asthma so SOB not new.pt request appt to be seen, Dr Dayton Martes not in office today; pt to see Dr Reece Agar today at 10:30. Pt said will rest till appt and if condition changes or worsens pt will callback. Pt husband will bring her to appt.

## 2012-12-15 ENCOUNTER — Encounter: Payer: Self-pay | Admitting: Internal Medicine

## 2012-12-15 ENCOUNTER — Ambulatory Visit (INDEPENDENT_AMBULATORY_CARE_PROVIDER_SITE_OTHER): Payer: BC Managed Care – PPO | Admitting: Internal Medicine

## 2012-12-15 VITALS — BP 124/84 | HR 99 | Temp 98.2°F | Ht 64.0 in | Wt 189.0 lb

## 2012-12-15 DIAGNOSIS — J45909 Unspecified asthma, uncomplicated: Secondary | ICD-10-CM

## 2012-12-15 MED ORDER — FAMOTIDINE 20 MG PO TABS: ORAL_TABLET | ORAL | Status: DC

## 2012-12-15 NOTE — Patient Instructions (Addendum)
GERD (REFLUX)  is an extremely common cause of respiratory symptoms like yours, many times with no significant heartburn at all.    It can be treated with medication, but also with lifestyle changes including avoidance of late meals, excessive alcohol, smoking cessation, and avoid fatty foods, chocolate, peppermint, colas, red wine, and acidic juices such as orange juice.  NO MINT OR MENTHOL PRODUCTS SO NO COUGH DROPS  USE SUGARLESS CANDY INSTEAD (jolley ranchers or Stover's)  NO OIL BASED VITAMINS - use powdered substitutes.  Add pepcid 20 mg one at bedtime until next office visit   Work on inhaler technique:  relax and gently blow all the way out then take a nice smooth deep breath back in, triggering the inhaler at same time you start breathing in.  Hold for up to 5 seconds if you can.  Rinse and gargle with water when done   If your mouth or throat starts to bother you,   I suggest you time the inhaler to your dental care and after using the inhaler(s) brush teeth and tongue with a baking soda containing toothpaste and when you rinse this out, gargle with it first to see if this helps your mouth and throat.     Please schedule a follow up office visit in 4 weeks, sooner if needed

## 2012-12-15 NOTE — Progress Notes (Signed)
  Subjective:    Patient ID: Melissa Allen, female    DOB: [birth year], MRN: [id number]  HPI  60 yowf   never smoker never childhood problems with onset of ? 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  12/15/2012 1st pulmonary cc seasonal symptoms as above until developing around 2007 and started attacks of asthma since around 2012 despite upper respiratory better since then using benadryl.  Maintained on symbicort now with less attacks less need for albuterol last espisode in March 2014. Prior to that in er or admitted frequently for asthma last time 02/2012.  Cough esp in am, also with singing, assoc with hoarsness worse on advair.  Sleeping ok without nocturnal  flare  of respiratory  c/o's or need for noct saba. Also denies any obvious fluctuation of symptoms with weather or environmental changes or other aggravating or alleviating factors except as outlined above.  C/o doe even when not actively wheezing but her greatest problem with sob occurs with voice use.   Review of Systems  Constitutional: Negative for fever, chills and unexpected weight change.  HENT: Positive for sneezing and voice change. Negative for ear pain, nosebleeds, congestion, sore throat, rhinorrhea, trouble swallowing, dental problem, postnasal drip and sinus pressure.   Eyes: Negative for visual disturbance.  Respiratory: Positive for cough and shortness of breath. Negative for choking.   Cardiovascular: Negative for chest pain and leg swelling.  Gastrointestinal: Negative for vomiting, abdominal pain and diarrhea.  Genitourinary: Negative for difficulty urinating.  Musculoskeletal: Negative for arthralgias.  Skin: Negative for rash.  Neurological: Negative for tremors, syncope and headaches.  Hematological: Does not bruise/bleed easily.        Objective:   Physical Exam  Wt Readings from Last 3 Encounters:  12/15/12 189 lb (85.73 kg)  12/13/12 186 lb 12 oz (84.709 kg)  12/07/12 184 lb (83.462 kg)    Obese amb wf with voice fatigue HEENT: nl dentition, turbinates, and orophanx. Nl external ear canals without cough reflex   NECK :  without JVD/Nodes/TM/ nl carotid upstrokes bilaterally   LUNGS: no acc muscle use, clear to A and P bilaterally without cough on insp or exp maneuvers   CV:  RRR  no s3 or murmur or increase in P2, no edema   ABD:  soft and nontender with nl excursion in the supine position. No bruits or organomegaly, bowel sounds nl  MS:  warm without deformities, calf tenderness, cyanosis or clubbing  SKIN: warm and dry without lesions    NEURO:  alert, approp, no deficits     pCXR 01/11/12  There is no evidence of acute cardiac or pulmonary process.      Assessment & Plan:

## 2012-12-15 NOTE — Assessment & Plan Note (Signed)
DDX of  difficult airways managment all start with A and  include Adherence, Ace Inhibitors, Acid Reflux, Active Sinus Disease, Alpha 1 Antitripsin deficiency, Anxiety masquerading as Airways dz,  ABPA,  allergy(esp in young), Aspiration (esp in elderly), Adverse effects of DPI,  Active smokers, plus two Bs  = Bronchiectasis and Beta blocker use..and one C= CHF  Adherence is always the initial "prime suspect" and is a multilayered concern that requires a "trust but verify" approach in every patient - starting with knowing how to use medications, especially inhalers, correctly, keeping up with refills and understanding the fundamental difference between maintenance and prns vs those medications only taken for a very short course and then stopped and not refilled. The proper method of use, as well as anticipated side effects, of a metered-dose inhaler are discussed and demonstrated to the patient. Improved effectiveness after extensive coaching during this visit to a level of approximately  75% but still needs lots of work.  ? Acid reflux > diet / hs pepcid to see if am cough improves  ? Allergy > no correlation at this point but need to keep in mind may be candidate for xolair if truly becomes refractory    Each maintenance medication was reviewed in detail including most importantly the difference between maintenance and as needed and under what circumstances the prns are to be used.  Please see instructions for details which were reviewed in writing and the patient given a copy.

## 2012-12-20 ENCOUNTER — Other Ambulatory Visit (INDEPENDENT_AMBULATORY_CARE_PROVIDER_SITE_OTHER): Payer: BC Managed Care – PPO

## 2012-12-20 DIAGNOSIS — I10 Essential (primary) hypertension: Secondary | ICD-10-CM

## 2012-12-20 LAB — BASIC METABOLIC PANEL
BUN: 16 mg/dL (ref 6–23)
CO2: 29 mEq/L (ref 19–32)
Chloride: 100 mEq/L (ref 96–112)
Creatinine, Ser: 0.7 mg/dL (ref 0.4–1.2)

## 2012-12-22 ENCOUNTER — Encounter: Payer: Self-pay | Admitting: Family Medicine

## 2012-12-22 ENCOUNTER — Ambulatory Visit (INDEPENDENT_AMBULATORY_CARE_PROVIDER_SITE_OTHER): Payer: BC Managed Care – PPO | Admitting: Family Medicine

## 2012-12-22 VITALS — BP 140/92 | HR 82 | Temp 98.5°F | Wt 191.5 lb

## 2012-12-22 DIAGNOSIS — I10 Essential (primary) hypertension: Secondary | ICD-10-CM

## 2012-12-22 DIAGNOSIS — R350 Frequency of micturition: Secondary | ICD-10-CM

## 2012-12-22 DIAGNOSIS — R42 Dizziness and giddiness: Secondary | ICD-10-CM

## 2012-12-22 LAB — POCT URINALYSIS DIPSTICK
Blood, UA: NEGATIVE
Glucose, UA: NEGATIVE
Nitrite, UA: NEGATIVE
Protein, UA: NEGATIVE
Spec Grav, UA: 1.02
Urobilinogen, UA: 0.2

## 2012-12-22 NOTE — Progress Notes (Signed)
  Subjective:    Patient ID: Melissa Allen, female    DOB: 20-Apr-1966, 47 y.o.   MRN: [id number]  HPI CC: UTI?  See prior note for details.  Last visit had HTN associated with HA and dizziness.  bp meds changed (amlodipine -> losartan), with improvement in blood pressure and improvement in headaches and overall improvement in lightheadedness.  Still endorses mild occasional dizziness. Denies presyncope, no vertigo, no orthostatic lightheadedness. bp at home running good (120/80 range)  Also noticed cloudy urine over last 1-2 days, noticing increased frequency.   Denies dysuria, urgency, hematuria.  No flank or abd pain, nausea/vomiting, fevers/chills.  Thinks staying hydrated with water.  H/o kidney stones but denies pain, does not feel like this.  Past Medical History  Diagnosis Date  . Allergy   . Calcaneal fracture   . Obesity (BMI 30.0-34.9)   . Asthma     recent admit to ER 01/11/12 for exac of asthma  . Hypertension   . Fibromyalgia   . Anginal pain     one episode 4-5 years ago-[facility]-nonspecific-no problems since-  . PONV (postoperative nausea and vomiting)   . Kidney stones   . Duplicated ureter, right      Review of Systems Per HPI    Objective:   Physical Exam  Nursing note and vitals reviewed. Constitutional: She is oriented to person, place, and time. She appears well-developed and well-nourished. No distress.  Obese. Body mass index is 32.85 kg/(m^2).   HENT:  Head: Normocephalic and atraumatic.  Mouth/Throat: Oropharynx is clear and moist. No oropharyngeal exudate.  Eyes: Conjunctivae and EOM are normal. Pupils are equal, round, and reactive to light. No scleral icterus.  Neck: Normal range of motion. Neck supple. Carotid bruit is not present. No thyromegaly present.  Cardiovascular: Normal rate, regular rhythm, normal heart sounds and intact distal pulses.   No murmur heard. Pulmonary/Chest: Effort normal and breath sounds normal. No  respiratory distress. She has no wheezes. She has no rales.  Abdominal: Soft. Bowel sounds are normal. She exhibits no distension. There is no tenderness. There is no rebound and no guarding.  Musculoskeletal: She exhibits no edema.  Lymphadenopathy:    She has no cervical adenopathy.  Neurological: She is alert and oriented to person, place, and time. She has normal strength. No cranial nerve deficit or sensory deficit. Coordination normal.  CN 2-12 intact Normal FTN  Skin: Skin is warm and dry. No rash noted.  Psychiatric: She has a normal mood and affect.       Assessment & Plan:

## 2012-12-22 NOTE — Assessment & Plan Note (Signed)
UA normal today. Recommended increased fluid intake (water and cranberry juice). ?mild dehydration.  No evidence of infection today.

## 2012-12-22 NOTE — Assessment & Plan Note (Signed)
Overall improved control and improved sxs on losartan - continue this.

## 2012-12-22 NOTE — Patient Instructions (Addendum)
Urine checked today and looking ok. I'd increase fluid intake (water and cranberry juice) and monitor symptoms. Good to see you today. No changes to meds today.

## 2012-12-22 NOTE — Assessment & Plan Note (Signed)
Mild , intermittent, overall improved with better BP control.  recommended continue to monitor and increase fluid intake to r/o dehydration as cause of this. Not consistent with presyncope, vertigo or imbalance. nonfocal neurological exam. Pt agrees with plan.

## 2013-01-03 ENCOUNTER — Ambulatory Visit (INDEPENDENT_AMBULATORY_CARE_PROVIDER_SITE_OTHER): Payer: BC Managed Care – PPO | Admitting: Family Medicine

## 2013-01-03 ENCOUNTER — Encounter: Payer: Self-pay | Admitting: Family Medicine

## 2013-01-03 VITALS — BP 120/80 | HR 86 | Temp 98.8°F | Wt 187.0 lb

## 2013-01-03 DIAGNOSIS — I10 Essential (primary) hypertension: Secondary | ICD-10-CM

## 2013-01-03 NOTE — Patient Instructions (Addendum)
Your blood pressure looks good. Please keep check your blood pressure and pulse twice a day.  Leave me the readings next week.

## 2013-01-03 NOTE — Progress Notes (Signed)
  Subjective:    Patient ID: Melissa Allen, female    DOB: 1966-06-10, 47 y.o.   MRN: [id number]  HPI 47 yo here for follow up HTN.    Amlodipine d/c'd on 4/16, started cozaar 50 mg daily.  Brings in BP log.  Bp much better controlled.    Good water, fruits/vegetables daily.  Avoids salt/sodium.  BP Readings from Last 3 Encounters:  01/03/13 120/80  12/22/12 140/92  12/15/12 124/84   Past Medical History  Diagnosis Date  . Allergy   . Calcaneal fracture   . Obesity (BMI 30.0-34.9)   . Asthma     recent admit to ER 01/11/12 for exac of asthma  . Hypertension   . Fibromyalgia   . Anginal pain     one episode 4-5 years ago-[facility]-nonspecific-no problems since-  . PONV (postoperative nausea and vomiting)   . Kidney stones   . Duplicated ureter, right     Review of Systems Per HPI    Objective:   Physical Exam  BP 120/80  Pulse 86  Temp(Src) 98.8 F (37.1 C)  Wt 187 lb (84.823 kg)  BMI 32.08 kg/m2  LMP 12/07/2011  Nursing note and vitals reviewed. Constitutional: She appears well-developed and well-nourished. No distress.  HENT:  Head: Normocephalic and atraumatic.  Mouth/Throat: Oropharynx is clear and moist. No oropharyngeal exudate.  Eyes: Conjunctivae and EOM are normal. Pupils are equal, round, and reactive to light. No scleral icterus.  Neck: Normal range of motion. Neck supple. Carotid bruit is not present. No thyromegaly present.  Cardiovascular: Normal rate, regular rhythm, normal heart sounds and intact distal pulses.   No murmur heard. Pulmonary/Chest: Effort normal and breath sounds normal. No respiratory distress. She has no wheezes. She has no rales.  Musculoskeletal: She exhibits no edema.  Lymphadenopathy:    She has no cervical adenopathy.  Skin: Skin is warm and dry. No rash noted.  Psychiatric: She has a normal mood and affect.      Assessment & Plan:  1. HYPERTENSION Improved.  Continue cozaar.  Has had a few elevated heart  rates, she will continue to check. She is under more stress lately at work.

## 2013-01-04 ENCOUNTER — Ambulatory Visit (INDEPENDENT_AMBULATORY_CARE_PROVIDER_SITE_OTHER): Payer: BC Managed Care – PPO | Admitting: Family Medicine

## 2013-01-04 ENCOUNTER — Encounter: Payer: Self-pay | Admitting: Family Medicine

## 2013-01-04 VITALS — BP 118/82 | HR 125 | Temp 98.3°F | Wt 187.0 lb

## 2013-01-04 DIAGNOSIS — J45909 Unspecified asthma, uncomplicated: Secondary | ICD-10-CM

## 2013-01-04 MED ORDER — DEXAMETHASONE SOD PHOSPHATE PF 10 MG/ML IJ SOLN
10.0000 mg | Freq: Once | INTRAMUSCULAR | Status: AC
  Administered 2013-01-04: 10 mg via INTRAMUSCULAR

## 2013-01-04 MED ORDER — IPRATROPIUM-ALBUTEROL 0.5-2.5 (3) MG/3ML IN SOLN: 3.0000 mL | RESPIRATORY_TRACT | Status: DC | PRN

## 2013-01-04 MED ORDER — IPRATROPIUM BROMIDE 0.02 % IN SOLN
0.5000 mg | Freq: Once | RESPIRATORY_TRACT | Status: AC
  Administered 2013-01-04: 0.5 mg via RESPIRATORY_TRACT

## 2013-01-04 MED ORDER — ALBUTEROL SULFATE (5 MG/ML) 0.5% IN NEBU
2.5000 mg | INHALATION_SOLUTION | Freq: Once | RESPIRATORY_TRACT | Status: AC
  Administered 2013-01-04: 2.5 mg via RESPIRATORY_TRACT

## 2013-01-04 NOTE — Patient Instructions (Addendum)
Good to see you, Melissa Allen. Please call me if no improvement by the end of the day. Take your prescription to [facility] for your nebulizer, I have sent the medication for the nebulizer to your pharmacy.

## 2013-01-04 NOTE — Addendum Note (Signed)
Addended by: Eliezer Bottom on: 01/04/2013 11:39 AM   Modules accepted: Orders

## 2013-01-04 NOTE — Progress Notes (Signed)
  Subjective:    Patient ID: Melissa Allen, female    DOB: 27-Feb-1966, 47 y.o.   MRN: [id number]   47 yo pleasant female with h/o allergic rhinitis and asthma here for acute onset of asthma flare - cough, wheezy, short of breath, chest tightness.  In am with phlegm, trouble coughing it up.  Out in yard this weekend - thinks asthma flared from this.  Asthma year round.  I saw her yesterday for BP follow up and lungs clear and she was asymptomatic at that time.  Woke up with persistent cough since this morning.  Cough and wheezing persisting despite albuterol use.    Denies fevers/chills, no sinus congestion or productive cough.  Takes symbicort with as needed albuterol.  Recently established with Dr. Sherene Sires.  OV note reviewed.  Added pepcid.  Past Medical History  Diagnosis Date  . Allergy   . Calcaneal fracture   . Obesity (BMI 30.0-34.9)   . Asthma     recent admit to ER 01/11/12 for exac of asthma  . Hypertension   . Fibromyalgia   . Anginal pain     one episode 4-5 years ago-[facility]-nonspecific-no problems since-  . PONV (postoperative nausea and vomiting)   . Kidney stones   . Duplicated ureter, right      Review of Systems Per HPI    Objective:   Physical Exam  BP 118/82  Pulse 125  Temp(Src) 98.3 F (36.8 C)  Wt 187 lb (84.823 kg)  BMI 32.08 kg/m2  SpO2 98%  LMP 12/07/2011  Constitutional: She appears well-developed and well-nourished. No distress.  HENT:  Head: Normocephalic and atraumatic.  Right Ear: Hearing, tympanic membrane, external ear and ear canal normal.  Left Ear: Hearing, tympanic membrane, external ear and ear canal normal.  Nose: Nose normal. No mucosal edema or rhinorrhea. Right sinus exhibits no maxillary sinus tenderness and no frontal sinus tenderness. Left sinus exhibits no maxillary sinus tenderness and no frontal sinus tenderness.  Mouth/Throat: Uvula is midline, oropharynx is clear and moist and mucous membranes are  normal. No oropharyngeal exudate, posterior oropharyngeal edema, posterior oropharyngeal erythema or tonsillar abscesses.  Eyes: Conjunctivae and EOM are normal. Pupils are equal, round, and reactive to light. No scleral icterus.  Neck: Normal range of motion. Neck supple.  Cardiovascular: Normal rate, regular rhythm, normal heart sounds and intact distal pulses.   No murmur heard. Pulmonary/Chest: No respiratory distress. She has decreased breath sounds. She has no wheezes. She has no rhonchi. She has no rales.  Very tight, poor air movement, improved after duoneb.  Unable to take deep breaths without coughing.  Lymphadenopathy:    She has no cervical adenopathy.  Skin: Skin is warm and dry. No rash noted.  After first breathing treatment, improved air movement but still tight. After second breathing treatment, improved air movement, breathing easier.  Able to speak in complete sentences.    Assessment & Plan:  1. ASTHMA Deteriorated- acute exacerbation. Continue Symbicort.  Very tight on exam, improved after duonebs. Also treat with Decadron IM in office today- she was resistant to course of oral steroids but will call if symptoms persistent.  Call or return to clinic prn if these symptoms worsen or fail to improve as anticipated. The patient indicates understanding of these issues and agrees with the plan. Tachycardic but did already have 3 doses of albuterol since early this morning.  Rx given for home nebulizer and duonebs sent to pharmacy.

## 2013-01-07 ENCOUNTER — Telehealth: Payer: Self-pay | Admitting: Internal Medicine

## 2013-01-07 NOTE — Telephone Encounter (Signed)
lmomtcb x1 

## 2013-01-07 NOTE — Telephone Encounter (Signed)
I spoke with Melissa Allen. Made her aware that MW and RB both have to agree with the switch. She stated she did not think MW spent enough time with her or listened to her reasoning she wants to changed. Please advise MW thanks

## 2013-01-07 NOTE — Telephone Encounter (Signed)
l °

## 2013-01-08 NOTE — Telephone Encounter (Signed)
Fine with me

## 2013-01-10 ENCOUNTER — Ambulatory Visit: Payer: BC Managed Care – PPO | Admitting: Internal Medicine

## 2013-01-10 NOTE — Telephone Encounter (Signed)
Ok with me 

## 2013-01-10 NOTE — Telephone Encounter (Signed)
lmomtcb x1 

## 2013-01-10 NOTE — Telephone Encounter (Signed)
Please advise Dr. Byrum thanks 

## 2013-01-11 NOTE — Telephone Encounter (Signed)
lmomtcb x 2  

## 2013-01-12 NOTE — Telephone Encounter (Signed)
LMTCB and will sign per triage protocol  

## 2013-02-12 ENCOUNTER — Other Ambulatory Visit: Payer: Self-pay | Admitting: Family Medicine

## 2013-02-21 ENCOUNTER — Ambulatory Visit (INDEPENDENT_AMBULATORY_CARE_PROVIDER_SITE_OTHER)
Admission: RE | Admit: 2013-02-21 | Discharge: 2013-02-21 | Disposition: A | Discharge status: Home / Self Care | Payer: BC Managed Care – PPO | Source: Ambulatory Visit | Attending: Family Medicine | Admitting: Family Medicine

## 2013-02-21 ENCOUNTER — Encounter: Payer: Self-pay | Admitting: Family Medicine

## 2013-02-21 ENCOUNTER — Ambulatory Visit (INDEPENDENT_AMBULATORY_CARE_PROVIDER_SITE_OTHER): Payer: BC Managed Care – PPO | Admitting: Family Medicine

## 2013-02-21 VITALS — BP 124/88 | HR 106 | Temp 98.7°F | Ht 64.0 in | Wt 182.0 lb

## 2013-02-21 DIAGNOSIS — M545 Low back pain, unspecified: Secondary | ICD-10-CM

## 2013-02-21 MED ORDER — CYCLOBENZAPRINE HCL 10 MG PO TABS: 10.0000 mg | ORAL_TABLET | Freq: Every day | ORAL | Status: DC

## 2013-02-21 MED ORDER — OXYCODONE-ACETAMINOPHEN 5-325 MG PO TABS: 1.0000 | ORAL_TABLET | ORAL | Status: DC | PRN

## 2013-02-21 NOTE — Progress Notes (Signed)
Nature conservation officer at [facility] [address] [place] [address] Phone: [phone] Fax: [phone]  Date:  02/21/2013   Name:  Melissa Allen   DOB:  08-16-1966   MRN:  [id number] Gender: female Age: 47 y.o.  Primary Physician:  [name], MD  Evaluating MD: Hannah Beat, MD   Chief Complaint: Back Pain   History of Present Illness:  Melissa Allen is a 47 y.o. pleasant patient who presents with the following:  S/p L4-5 laminectomy by Dr. Channing Mutters:  Back is really hurting her. Started for a couple of days and last night was really bad. 10/10 pain, worse than childbirth. Worse than kidney stones. Did have some hip posterior pain. No LE radiculopathy. Could not really sit down - something was catching. Feels better to be standing. No numbness or radiculopathy like she had prior to her previous spine surgery, but the pain is worse. Overall, feels terrible - almost went to the ER last night. No bowel or bladder incontinence.   Fell at school 3 weeks ago, no probs after that. None until the last few days per her report.     Patient Active Problem List   Diagnosis Date Noted  . Urine frequency 12/22/2012  . Dizziness 12/22/2012  . Asthma with acute exacerbation 11/01/2012  . Chest wall pain 09/28/2012  . HEADACHE 04/26/2010  . FIBROMYALGIA 01/25/2010  . ANXIETY 01/22/2010  . DEPRESSION 01/22/2010  . REFLUX GASTRITIS 01/22/2010  . HEPATIC CYST 01/22/2010  . NEPHROLITHIASIS, HX OF 01/22/2010  . HYPERTENSION 01/01/2009  . PALPITATIONS 01/11/2008  . CONTACT DERMATITIS&OTHER ECZEMA DUE TO PLANTS 12/27/2007  . ASTHMA 11/26/2007  . GERD 11/26/2007  . PANCREATITIS, CHRONIC 11/26/2007  . RENAL CALCULUS 11/26/2007  . ARTHRITIS 11/26/2007  . ALLERGIC RHINITIS 11/24/2007    Past Medical History  Diagnosis Date  . Allergy   . Calcaneal fracture   . Obesity (BMI 30.0-34.9)   . Asthma     recent admit to ER 01/11/12 for exac of asthma  . Hypertension   . Fibromyalgia   .  Anginal pain     one episode 4-5 years ago-[facility]-nonspecific-no problems since-  . PONV (postoperative nausea and vomiting)   . Kidney stones   . Duplicated ureter, right     Past Surgical History  Procedure Laterality Date  . Tonsillectomy  age 67  . Bunionectomy  1995    Right  . Cholecystectomy  late 1980's       . Lumbar laminectomy  10/06    L4-5, Ray  . Knee surgery      x 2, Murphy/Sue  . Lithotripsy      Multiple  . R compressed pronator      Sypher  . Tendon repair  2013  . Tendon repair  02/01/12  . Bladder suspension  03/01/2012    Procedure: TRANSVAGINAL TAPE (TVT) PROCEDURE;  Surgeon: Allie Bossier, MD;  Location: [facility];  Service: Gynecology;  Laterality: N/A;  . Cystoscopy  03/01/2012    Procedure: CYSTOSCOPY;  Surgeon: Allie Bossier, MD;  Location: [facility];  Service: Gynecology;  Laterality: N/A;  . Abdominal hysterectomy      History   Social History  . Marital Status: Married    Spouse Name: N/A    Number of Children: 3  . Years of Education: N/A   Occupational History  . 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 Smoking status:  Never Smoker   . Smokeless tobacco: Never Used  . Alcohol Use: 3.5 oz/week    7 drink(s) per week     Comment: Occasional  . Drug Use: No  . Sexually Active: Yes    Birth Control/ Protection: Surgical   Other Topics Concern  . Not on file   Social History Narrative   Caffeine use- 2 daily    Family History  Problem Relation Age of Onset  . Heart disease Father   . Colon polyps Father   . Colon cancer Paternal Grandfather   . Colon cancer Maternal Grandmother   . Uterine cancer      Grandmother  . Breast cancer      Grandmother  . Ovarian cancer      Grandmother  . Diabetes      mother  . Other Neg Hx     Allergies  Allergen Reactions  . Codeine     REACTION: vomiting  . Hydrocodone Nausea And Vomiting  . Latex Hives  . Lisinopril     Cough      Medication list has been reviewed and updated.  Outpatient Prescriptions Prior to Visit  Medication Sig Dispense Refill  . albuterol (PROAIR HFA) 108 (90 BASE) MCG/ACT inhaler Inhale 2 puffs into the lungs every 4 (four) hours as needed. Shortness of breath  1 Inhaler  11  . budesonide-formoterol (SYMBICORT) 160-4.5 MCG/ACT inhaler Inhale 2 puffs into the lungs 2 (two) times daily.  1 Inhaler  11  . cholecalciferol (VITAMIN D) 1000 UNITS tablet Take 1,000 Units by mouth daily.      Marland Kitchen co-enzyme Q-10 30 MG capsule Take 30 mg by mouth daily.      . DULoxetine (CYMBALTA) 60 MG capsule TAKE ONE CAPSULE BY MOUTH NIGHTLY AT BEDTIME  30 capsule  2  . famotidine (PEPCID) 20 MG tablet One at bedtime  30 tablet  11  . fluticasone (FLONASE) 50 MCG/ACT nasal spray Place 2 sprays into the nose daily as needed.       . gabapentin (NEURONTIN) 400 MG capsule Take 1 capsule (400 mg total) by mouth at bedtime.  30 capsule  3  . ipratropium-albuterol (DUONEB) 0.5-2.5 (3) MG/3ML SOLN Take 3 mLs by nebulization every 4 (four) hours as needed.  360 mL  1  . losartan (COZAAR) 50 MG tablet Take 1 tablet (50 mg total) by mouth daily.  30 tablet  3  . meloxicam (MOBIC) 15 MG tablet Take 15 mg by mouth daily as needed.      . montelukast (SINGULAIR) 10 MG tablet Take 1 tablet (10 mg total) by mouth at bedtime.  30 tablet  5  . Spacer/Aero-Holding Chambers (POCKET SPACER) DEVI Use as directed.  1 each  0  . thiamine (VITAMIN B-1) 100 MG tablet Take 100 mg by mouth daily.      . traZODone (DESYREL) 100 MG tablet TAKE ONE TABLET BY MOUTH NIGHTLY AT BEDTIME  30 tablet  11  . triamterene-hydrochlorothiazide (MAXZIDE-25) 37.5-25 MG per tablet TAKE ONE TABLET BY MOUTH ONE TIME DAILY  30 tablet  5   No facility-administered medications prior to visit.    Review of Systems:   GEN: No fevers, chills. Nontoxic. Primarily MSK c/o today. MSK: Detailed in the HPI GI: tolerating PO intake without difficulty Neuro: detailed  above Otherwise the pertinent positives of the ROS are noted above.    Physical Examination: BP 124/88  Pulse 106  Temp(Src) 98.7 F (37.1 C) (Oral)  Ht  5\' 4"  (1.626 m)  Wt 182 lb (82.555 kg)  BMI 31.22 kg/m2  SpO2 97%  LMP 12/07/2011  Ideal Body Weight: Weight in (lb) to have BMI = 25: 145.3   GEN: Well-developed,well-nourished,in no acute distress; alert,appropriate and cooperative throughout examination HEENT: Normocephalic and atraumatic without obvious abnormalities. Ears, externally no deformities PULM: Breathing comfortably in no respiratory distress EXT: No clubbing, cyanosis, or edema PSYCH: Normally interactive. Cooperative during the interview. Pleasant. Friendly and conversant. Not anxious or depressed appearing. Normal, full affect.  Range of motion at  the waist: Flexion, extension, lateral bending and rotation: unable to flex at all, no extension, minimal lateral bending, very little rotation.  No echymosis or edema Rises to examination table with considerable difficulty Gait: minimally antalgic  Inspection/Deformity: N Paraspinus Tenderness: l3-s1  B Ankle Dorsiflexion (L5,4): 5/5 B Great Toe Dorsiflexion (L5,4): 5/5 Heel Walk (L5): WNL Toe Walk (S1): WNL Rise/Squat (L4): WNL, mild pain  SENSORY B Medial Foot (L4): WNL B Dorsum (L5): WNL B Lateral (S1): WNL Light Touch: WNL Pinprick: WNL  REFLEXES Knee (L4): 2+ Ankle (S1): 2+  B SLR, seated: posterior buttocks pain B SLR, supine: unable to lie supine B Greater Troch: NT B Log Roll: neg B Sciatic Notch: TTP  Dg Lumbar Spine Complete  02/21/2013   *RADIOLOGY REPORT*  Clinical Data: Low back pain.  LUMBAR SPINE - COMPLETE 4+ VIEW  Comparison: 01/25/2013  Findings: Large Schmorl's node at the L2 vertebral body, stable since prior CT.  Mild degenerative spurring at T11-12 anteriorly. No fracture.  No malalignment.  Disc spaces are maintained.  IMPRESSION: No acute bony abnormality.   Original Report  Authenticated By: Charlett Nose, M.D.    Assessment and Plan:  Low back pain - Plan: DG Lumbar Spine Complete  10/10 back pain. Prior spine surgery Challenging case xr grossly normal  Cont nsaids Prn percocet Prn flexeril  Suggested steroids, but she declined.   If cont to do poorly, NSG involvement may be a good idea.  Orders Today:  Orders Placed This Encounter  Procedures  . DG Lumbar Spine Complete    Standing Status: Future     Number of Occurrences: 1     Standing Expiration Date: 04/23/2014    Order Specific Question:  Preferred imaging location?    Answer:  [facility]    Order Specific Question:  Reason for exam:    Answer:  back pain    Updated Medication List: (Includes new medications, updates to list, dose adjustments) Meds ordered this encounter  Medications  . amLODipine (NORVASC) 5 MG tablet    Sig:   . DISCONTD: cyclobenzaprine (FLEXERIL) 10 MG tablet    Sig: Take 1 tablet (10 mg total) by mouth at bedtime.    Dispense:  30 tablet    Refill:  1  . oxyCODONE-acetaminophen (PERCOCET/ROXICET) 5-325 MG per tablet    Sig: Take 1 tablet by mouth every 4 (four) hours as needed for pain.    Dispense:  50 tablet    Refill:  0  . cyclobenzaprine (FLEXERIL) 10 MG tablet    Sig: Take 1 tablet (10 mg total) by mouth at bedtime.    Dispense:  30 tablet    Refill:  1    Medications Discontinued: Medications Discontinued During This Encounter  Medication Reason  . cyclobenzaprine (FLEXERIL) 10 MG tablet Reorder      Signed, Karleen Hampshire T. Alletta Mattos, MD 02/21/2013 10:15 AM

## 2013-03-26 ENCOUNTER — Other Ambulatory Visit: Payer: Self-pay | Admitting: Family Medicine

## 2013-03-28 ENCOUNTER — Encounter: Payer: Self-pay | Admitting: Family Medicine

## 2013-03-28 ENCOUNTER — Ambulatory Visit (INDEPENDENT_AMBULATORY_CARE_PROVIDER_SITE_OTHER): Payer: BC Managed Care – PPO | Admitting: Family Medicine

## 2013-03-28 ENCOUNTER — Telehealth: Payer: Self-pay | Admitting: Family Medicine

## 2013-03-28 VITALS — BP 110/74 | HR 112 | Temp 98.3°F | Wt 189.0 lb

## 2013-03-28 DIAGNOSIS — J441 Chronic obstructive pulmonary disease with (acute) exacerbation: Secondary | ICD-10-CM

## 2013-03-28 DIAGNOSIS — J209 Acute bronchitis, unspecified: Secondary | ICD-10-CM

## 2013-03-28 MED ORDER — PREDNISONE 20 MG PO TABS: ORAL_TABLET | ORAL | Status: DC

## 2013-03-28 MED ORDER — AZITHROMYCIN 250 MG PO TABS: ORAL_TABLET | ORAL | Status: DC

## 2013-03-28 NOTE — Telephone Encounter (Signed)
Patient Information:  Caller Name: [name]  Phone: [phone]  Patient: Melissa Allen, Melissa Allen  Gender: Female  DOB: 12/29/1965  Age: 47 Years  PCP: [name] [facility])  Pregnant: No  Office Follow Up:  Does the office need to follow up with this patient?: No  Instructions For The Office: N/A  RN Note:  History of Hysterectomy. Patient states she developed chest congestion, cough. Onset 03/24/13. Patient states she is expectorating large thick green mucous. Patient states she has increased fatigue. States she has discomfort in the middle of her chest with deep inspiration. Patient states she has wheezing with cough. Care advice given per guidelines. Patient advised may take Mucinex per package instructions.  Call back parameters reviewed. Patient verbalizes understanding.  Symptoms  Reason For Call & Symptoms: Cough, expectorating green sputum  Reviewed Health History In EMR: Yes  Reviewed Medications In EMR: Yes  Reviewed Allergies In EMR: Yes  Reviewed Surgeries / Procedures: Yes  Date of Onset of Symptoms: 03/24/2013  Treatments Tried: Albuterol Inhaler  Treatments Tried Worked: No OB / GYN:  LMP: Unknown  Guideline(s) Used:  Cough  Asthma Attack  Disposition Per Guideline:   See Today in Office  Reason For Disposition Reached:   Patient wants to be seen  Advice Given:  Reassurance  Coughing is the way that our lungs remove irritants and mucus. It helps protect our lungs from getting pneumonia.  Coughing Spasms:  Drink warm fluids. Inhale warm mist (Reason: both relax the airway and loosen up the phlegm).  Prevent Dehydration:  Drink adequate liquids.  This will help soothe an irritated or dry throat and loosen up the phlegm.  Avoid Tobacco Smoke:  Smoking or being exposed to smoke makes coughs much worse.  Call Back If:  Difficulty breathing  You become worse.  Humidifier:   If the air is dry, use a cool mist humidifier to prevent drying of the upper  airway.  Drinking Liquids:  Try to drink normal amount of liquids (e.g., water). Being adequately hydrated makes it easier to cough up the sticky lung mucus.  Call Back If:  You become worse.  Patient Will Follow Care Advice:  YES  Appointment Scheduled:  03/28/2013 11:45:00 Appointment Scheduled Provider:  Hannah Beat [facility])

## 2013-03-28 NOTE — Progress Notes (Signed)
Nature conservation officer at [facility] [address] [place] [address] Phone: [phone] Fax: [phone]  Date:  03/28/2013   Name:  Melissa Allen   DOB:  1966/06/26   MRN:  [id number] Gender: female Age: 47 y.o.  Primary Physician:  [name], MD  Evaluating MD: Hannah Beat, MD   Chief Complaint: Cough, chest congestion, headache   History of Present Illness:  Melissa Allen is a 47 y.o. pleasant patient who presents with the following:  HA, coughing, using her inhaler. Using symbicort bid, and then also is using her albuterol, 1-3 hours.  Feels like cannot get any air movement. Pulse ox 98. Coughing up green chunks of mucous.   Patient Active Problem List   Diagnosis Date Noted  . Urine frequency 12/22/2012  . Dizziness 12/22/2012  . Asthma with acute exacerbation 11/01/2012  . Chest wall pain 09/28/2012  . HEADACHE 04/26/2010  . FIBROMYALGIA 01/25/2010  . ANXIETY 01/22/2010  . DEPRESSION 01/22/2010  . REFLUX GASTRITIS 01/22/2010  . HEPATIC CYST 01/22/2010  . NEPHROLITHIASIS, HX OF 01/22/2010  . HYPERTENSION 01/01/2009  . PALPITATIONS 01/11/2008  . CONTACT DERMATITIS&OTHER ECZEMA DUE TO PLANTS 12/27/2007  . ASTHMA 11/26/2007  . GERD 11/26/2007  . PANCREATITIS, CHRONIC 11/26/2007  . RENAL CALCULUS 11/26/2007  . ARTHRITIS 11/26/2007  . ALLERGIC RHINITIS 11/24/2007    Past Medical History  Diagnosis Date  . Allergy   . Calcaneal fracture   . Obesity (BMI 30.0-34.9)   . Asthma     recent admit to ER 01/11/12 for exac of asthma  . Hypertension   . Fibromyalgia   . Anginal pain     one episode 4-5 years ago-[facility]-nonspecific-no problems since-  . PONV (postoperative nausea and vomiting)   . Kidney stones   . Duplicated ureter, right     Past Surgical History  Procedure Laterality Date  . Tonsillectomy  age 30  . Bunionectomy  1995    Right  . Cholecystectomy  late 1980's       . Lumbar laminectomy  10/06    L4-5, Ray    . Knee surgery      x 2, Murphy/Sue  . Lithotripsy      Multiple  . R compressed pronator      Sypher  . Tendon repair  2013  . Tendon repair  02/01/12  . Bladder suspension  03/01/2012    Procedure: TRANSVAGINAL TAPE (TVT) PROCEDURE;  Surgeon: Allie Bossier, MD;  Location: [facility];  Service: Gynecology;  Laterality: N/A;  . Cystoscopy  03/01/2012    Procedure: CYSTOSCOPY;  Surgeon: Allie Bossier, MD;  Location: [facility];  Service: Gynecology;  Laterality: N/A;  . Abdominal hysterectomy      History   Social History  . Marital Status: Married    Spouse Name: N/A    Number of Children: 3  . Years of Education: N/A   Occupational History  . 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 Smoking status: Never Smoker   . Smokeless tobacco: Never Used  . Alcohol Use: 3.5 oz/week    7 drink(s) per week     Comment: Occasional  . Drug Use: No  . Sexually Active: Yes    Birth Control/ Protection: Surgical   Other Topics Concern  . Not on file   Social History Narrative   Caffeine use- 2 daily    Family History  Problem Relation Age of  Onset  . Heart disease Father   . Colon polyps Father   . Colon cancer Paternal Grandfather   . Colon cancer Maternal Grandmother   . Uterine cancer      Grandmother  . Breast cancer      Grandmother  . Ovarian cancer      Grandmother  . Diabetes      mother  . Other Neg Hx     Allergies  Allergen Reactions  . Codeine     REACTION: vomiting  . Hydrocodone Nausea And Vomiting  . Latex Hives  . Lisinopril     Cough     Medication list has been reviewed and updated.  Outpatient Prescriptions Prior to Visit  Medication Sig Dispense Refill  . albuterol (PROAIR HFA) 108 (90 BASE) MCG/ACT inhaler Inhale 2 puffs into the lungs every 4 (four) hours as needed. Shortness of breath  1 Inhaler  11  . amLODipine (NORVASC) 5 MG tablet       . budesonide-formoterol (SYMBICORT) 160-4.5 MCG/ACT inhaler Inhale 2 puffs  into the lungs 2 (two) times daily.  1 Inhaler  11  . cholecalciferol (VITAMIN D) 1000 UNITS tablet Take 1,000 Units by mouth daily.      Marland Kitchen co-enzyme Q-10 30 MG capsule Take 30 mg by mouth daily.      . DULoxetine (CYMBALTA) 60 MG capsule TAKE ONE CAPSULE BY MOUTH   NIGHTLY AT BEDTIME  30 capsule  2  . famotidine (PEPCID) 20 MG tablet One at bedtime  30 tablet  11  . fluticasone (FLONASE) 50 MCG/ACT nasal spray Place 2 sprays into the nose daily as needed.       . gabapentin (NEURONTIN) 400 MG capsule Take 1 capsule (400 mg total) by mouth at bedtime.  30 capsule  3  . ipratropium-albuterol (DUONEB) 0.5-2.5 (3) MG/3ML SOLN Take 3 mLs by nebulization every 4 (four) hours as needed.  360 mL  1  . losartan (COZAAR) 50 MG tablet Take 1 tablet (50 mg total) by mouth daily.  30 tablet  3  . montelukast (SINGULAIR) 10 MG tablet Take 1 tablet (10 mg total) by mouth at bedtime.  30 tablet  5  . Spacer/Aero-Holding Chambers (POCKET SPACER) DEVI Use as directed.  1 each  0  . thiamine (VITAMIN B-1) 100 MG tablet Take 100 mg by mouth daily.      . traZODone (DESYREL) 100 MG tablet TAKE ONE TABLET BY MOUTH NIGHTLY AT BEDTIME  30 tablet  11  . triamterene-hydrochlorothiazide (MAXZIDE-25) 37.5-25 MG per tablet TAKE ONE TABLET BY MOUTH ONE TIME DAILY  30 tablet  5  . cyclobenzaprine (FLEXERIL) 10 MG tablet Take 1 tablet (10 mg total) by mouth at bedtime.  30 tablet  1  . meloxicam (MOBIC) 15 MG tablet Take 15 mg by mouth daily as needed.      Marland Kitchen oxyCODONE-acetaminophen (PERCOCET/ROXICET) 5-325 MG per tablet Take 1 tablet by mouth every 4 (four) hours as needed for pain.  50 tablet  0   No facility-administered medications prior to visit.    Review of Systems:  ROS: GEN: Acute illness details above GI: Tolerating PO intake GU: maintaining adequate hydration and urination Pulm: as above Interactive and getting along well at home.  Otherwise, ROS is as per the HPI.   Physical Examination: BP 110/74   Pulse 112  Temp(Src) 98.3 F (36.8 C)  Wt 189 lb (85.73 kg)  BMI 32.43 kg/m2  SpO2 98%  LMP 12/07/2011  Ideal  Body Weight:     GEN: A and O x 3. WDWN. NAD.    ENT: Nose clear, ext NML.  No LAD.  No JVD.  TM's clear. Oropharynx clear.  PULM: Normal WOB, no distress. Decreased breath sounds with rare wheezes. CV: RRR, no M/G/R, No rubs, No JVD.   EXT: warm and well-perfused, No c/c/e. PSYCH: Pleasant and conversant.   Assessment and Plan:  Asthma, chronic obstructive, with acute exacerbation  Acute bronchitis  Steroids and abx  Orders Today:  No orders of the defined types were placed in this encounter.    Updated Medication List: (Includes new medications, updates to list, dose adjustments) Meds ordered this encounter  Medications  . predniSONE (DELTASONE) 20 MG tablet    Sig: 2 tabs po daily for 5 days, then 1 tab po daily for 3 days    Dispense:  13 tablet    Refill:  0  . azithromycin (ZITHROMAX Z-PAK) 250 MG tablet    Sig: Take 2 tablets (500 mg) on  Day 1,  followed by 1 tablet (250 mg) once daily on Days 2 through 5.    Dispense:  6 each    Refill:  0    Medications Discontinued: Medications Discontinued During This Encounter  Medication Reason  . cyclobenzaprine (FLEXERIL) 10 MG tablet Error  . meloxicam (MOBIC) 15 MG tablet Error  . oxyCODONE-acetaminophen (PERCOCET/ROXICET) 5-325 MG per tablet Error      Signed, Jaiyon Wander T. Sayre Witherington, MD 03/28/2013 12:14 PM

## 2013-03-30 ENCOUNTER — Other Ambulatory Visit: Payer: Self-pay | Admitting: Family Medicine

## 2013-04-13 ENCOUNTER — Other Ambulatory Visit: Payer: Self-pay | Admitting: Family Medicine

## 2013-04-13 NOTE — Telephone Encounter (Signed)
Received refill request electronically. Last office visit 03/28/13. See allergy/contraindication. Is it okay to refill medication?

## 2013-05-17 ENCOUNTER — Encounter: Payer: Self-pay | Admitting: Family Medicine

## 2013-05-17 ENCOUNTER — Ambulatory Visit (INDEPENDENT_AMBULATORY_CARE_PROVIDER_SITE_OTHER): Payer: BC Managed Care – PPO | Admitting: Family Medicine

## 2013-05-17 ENCOUNTER — Ambulatory Visit (INDEPENDENT_AMBULATORY_CARE_PROVIDER_SITE_OTHER)
Admission: RE | Admit: 2013-05-17 | Discharge: 2013-05-17 | Disposition: A | Discharge status: Home / Self Care | Payer: BC Managed Care – PPO | Source: Ambulatory Visit | Attending: Family Medicine | Admitting: Family Medicine

## 2013-05-17 VITALS — BP 120/82 | HR 95 | Temp 98.3°F | Ht 64.0 in | Wt 189.0 lb

## 2013-05-17 DIAGNOSIS — M25561 Pain in right knee: Secondary | ICD-10-CM | POA: Insufficient documentation

## 2013-05-17 DIAGNOSIS — M25569 Pain in unspecified knee: Secondary | ICD-10-CM

## 2013-05-17 MED ORDER — MELOXICAM 15 MG PO TABS: 15.0000 mg | ORAL_TABLET | Freq: Every day | ORAL | Status: DC

## 2013-05-17 MED ORDER — TRAMADOL HCL 50 MG PO TABS: 50.0000 mg | ORAL_TABLET | Freq: Three times a day (TID) | ORAL | Status: DC | PRN

## 2013-05-17 NOTE — Progress Notes (Signed)
Subjective:    Patient ID: Melissa Allen, female    DOB: 08/22/1966, 47 y.o.   MRN: [id number]  HPI  47 year old female pt of Dr. Elmer Sow presents following a fall yesterday. She reports that she slipped on water... She fell forward on to hands and knees... Primary impact on right knee.  immediately she noted swelling, pain and redness. She was unable to weight bear immediately after, but after a few minutes she was able to walk to car. Once she got home she took advil 600 mg, iced knee.  She was unable to sleep due to pain, most pain though is with bending knee, but also with weight bearing and walking.  No popping or clicking.  she does also have medial pain in left knee.   Swelling has gone down some.   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 Later followed by arthroscopic surgery.  Review of Systems  Constitutional: Negative for fever and fatigue.  HENT: Negative for ear pain.   Eyes: Negative for pain.  Respiratory: Negative for chest tightness and shortness of breath.   Cardiovascular: Negative for chest pain, palpitations and leg swelling.  Gastrointestinal: Negative for abdominal pain.  Genitourinary: Negative for dysuria.       Objective:   Physical Exam  Constitutional: Vital signs are normal. She appears well-developed and well-nourished. She is cooperative.  Non-toxic appearance. She does not appear ill. No distress.  HENT:  Head: Normocephalic.  Right Ear: Hearing, tympanic membrane, external ear and ear canal normal. Tympanic membrane is not erythematous, not retracted and not bulging.  Left Ear: Hearing, tympanic membrane, external ear and ear canal normal. Tympanic membrane is not erythematous, not retracted and not bulging.  Nose: No mucosal edema or rhinorrhea. Right sinus exhibits no maxillary sinus tenderness and no frontal sinus tenderness. Left sinus exhibits no maxillary sinus tenderness and no frontal sinus tenderness.   Mouth/Throat: Uvula is midline, oropharynx is clear and moist and mucous membranes are normal.  Eyes: Conjunctivae, EOM and lids are normal. Pupils are equal, round, and reactive to light. Lids are everted and swept, no foreign bodies found.  Neck: Trachea normal and normal range of motion. Neck supple. Carotid bruit is not present. No mass and no thyromegaly present.  Cardiovascular: Normal rate, regular rhythm, S1 normal, S2 normal, normal heart sounds, intact distal pulses and normal pulses.  Exam reveals no gallop and no friction rub.   No murmur heard. Pulmonary/Chest: Effort normal and breath sounds normal. Not tachypneic. No respiratory distress. She has no decreased breath sounds. She has no wheezes. She has no rhonchi. She has no rales.  Abdominal: Soft. Normal appearance and bowel sounds are normal. There is no tenderness.  Musculoskeletal:       Right knee: She exhibits decreased range of motion, swelling, effusion, ecchymosis, erythema, abnormal patellar mobility and bony tenderness. She exhibits normal meniscus and no MCL laxity. Tenderness found. No medial joint line, no lateral joint line, no MCL, no LCL and no patellar tendon tenderness noted.       Left knee: She exhibits normal range of motion, no swelling, no effusion, no bony tenderness, normal meniscus and no MCL laxity. Tenderness found. Medial joint line and lateral joint line tenderness noted. No MCL, no LCL and no patellar tendon tenderness noted.  Neurological: She is alert.  Skin: Skin is warm, dry and intact. No rash noted.  Psychiatric: Her speech is normal and behavior is normal. Judgment and  thought content normal. Her mood appears not anxious. Cognition and memory are normal. She does not exhibit a depressed mood.          Assessment & Plan:

## 2013-05-17 NOTE — Assessment & Plan Note (Signed)
Will eval with X-rays for fracture given fall. Most likely deep bone bruise. Treat with NSAIDs, ice and gentle stretching.

## 2013-05-17 NOTE — Patient Instructions (Signed)
Meloxicam for pain and swelling. Ice 2-3 times daily. Gentle stretching. We will call with X-ray results. (we will call [phone]) Follow up with PCP in 2 weeks if not better.

## 2013-05-27 ENCOUNTER — Ambulatory Visit (INDEPENDENT_AMBULATORY_CARE_PROVIDER_SITE_OTHER): Payer: BC Managed Care – PPO | Admitting: Family Medicine

## 2013-05-27 ENCOUNTER — Encounter: Payer: Self-pay | Admitting: Family Medicine

## 2013-05-27 VITALS — BP 124/84 | HR 92 | Temp 98.4°F | Wt 194.2 lb

## 2013-05-27 DIAGNOSIS — R609 Edema, unspecified: Secondary | ICD-10-CM

## 2013-05-27 LAB — BASIC METABOLIC PANEL
BUN: 15 mg/dL (ref 6–23)
CO2: 30 mEq/L (ref 19–32)
Chloride: 102 mEq/L (ref 96–112)
GFR: 84.89 mL/min (ref >60.00)
Glucose, Bld: 88 mg/dL (ref 70–99)
Potassium: 3.5 mEq/L (ref 3.5–5.1)
Sodium: 136 mEq/L (ref 135–145)

## 2013-05-27 MED ORDER — BUDESONIDE-FORMOTEROL FUMARATE 160-4.5 MCG/ACT IN AERO: 2.0000 | INHALATION_SPRAY | Freq: Two times a day (BID) | RESPIRATORY_TRACT | Status: DC

## 2013-05-27 NOTE — Progress Notes (Signed)
Needed an rx for symbicort to use with a coupon.  BLE edema.  Had been swelling for the last week, episodically.  Worse at night, better in the AM. Worse last night.  On feet a lot. Swelling is better today.  She denies salt loads. On amlodipine. No change in meds. No med change in the last week. No CP. Not SOB, other than typical allergy/asthma sx.  Hasn't used compression stockings.   Meds, vitals, and allergies reviewed.   ROS: See HPI.  Otherwise, noncontributory.  GEN: nad, alert and oriented HEENT: mucous membranes moist NECK: supple w/o LA CV: rrr. PULM: ctab, no inc wob ABD: soft, +bs EXT: trace edema in BLE SKIN: no acute rash

## 2013-05-27 NOTE — Patient Instructions (Signed)
Use some compression stockings or support hose for now.  Elevate your feet as much as possible.  Go to the lab on the way out.  We'll contact you with your lab report. Don't change your meds for now.

## 2013-05-29 DIAGNOSIS — R609 Edema, unspecified: Secondary | ICD-10-CM | POA: Insufficient documentation

## 2013-05-29 NOTE — Assessment & Plan Note (Signed)
Use compression stockings or support hose for now.  Elevate feet as much as possible.  Labs unremarkable.   Note to PCP, consider amlodipine change if continues.  Didn't change meds today as BP is okay.

## 2013-06-14 ENCOUNTER — Encounter: Payer: Self-pay | Admitting: Family Medicine

## 2013-06-14 ENCOUNTER — Ambulatory Visit (INDEPENDENT_AMBULATORY_CARE_PROVIDER_SITE_OTHER): Payer: BC Managed Care – PPO | Admitting: Family Medicine

## 2013-06-14 VITALS — BP 126/96 | HR 96 | Temp 98.3°F | Wt 186.0 lb

## 2013-06-14 DIAGNOSIS — R609 Edema, unspecified: Secondary | ICD-10-CM

## 2013-06-14 NOTE — Patient Instructions (Signed)
Stop the amlodipine for about 1 week.  Elevate your legs as much as possible.  Update [place] next week.  Take care.

## 2013-06-15 NOTE — Progress Notes (Signed)
BLE edema continues, worse as the day goes on.  Not SOB and no orthopnea.  No fevers.  No CP, no leg trauma.  Last note reviewed.  No NSAID use.  BMET wnl prev.   R TM feels full and her allergy sx are worse as she has missed some singulair doses.   Meds, vitals, and allergies reviewed.   ROS: See HPI.  Otherwise, noncontributory.  nad ncat TMs wnl rrr ctab abd soft Ext with 1+ BLE edema Normal DP pulses No calf asymmetry, no chords.

## 2013-06-15 NOTE — Assessment & Plan Note (Signed)
Unlikely to be from DVT.  All neg on well's criteria.  More likely from CCB.  Stop amlodipine, elevate legs, update [place] in a few days.  She agrees.

## 2013-06-16 ENCOUNTER — Telehealth: Payer: Self-pay | Admitting: Family Medicine

## 2013-06-16 ENCOUNTER — Ambulatory Visit (INDEPENDENT_AMBULATORY_CARE_PROVIDER_SITE_OTHER): Payer: BC Managed Care – PPO | Admitting: Family Medicine

## 2013-06-16 ENCOUNTER — Emergency Department (HOSPITAL_COMMUNITY)
Admission: EM | Admit: 2013-06-16 | Discharge: 2013-06-16 | Disposition: A | Discharge status: Home / Self Care | Payer: BC Managed Care – PPO | Attending: Emergency Medicine | Admitting: Emergency Medicine

## 2013-06-16 ENCOUNTER — Emergency Department (HOSPITAL_COMMUNITY): Payer: BC Managed Care – PPO

## 2013-06-16 ENCOUNTER — Encounter: Payer: Self-pay | Admitting: Family Medicine

## 2013-06-16 ENCOUNTER — Ambulatory Visit: Payer: Self-pay | Admitting: Internal Medicine

## 2013-06-16 VITALS — BP 110/80 | HR 116 | Temp 98.4°F | Ht 64.0 in | Wt 194.0 lb

## 2013-06-16 DIAGNOSIS — I10 Essential (primary) hypertension: Secondary | ICD-10-CM | POA: Insufficient documentation

## 2013-06-16 DIAGNOSIS — Z8781 Personal history of (healed) traumatic fracture: Secondary | ICD-10-CM | POA: Insufficient documentation

## 2013-06-16 DIAGNOSIS — J45901 Unspecified asthma with (acute) exacerbation: Secondary | ICD-10-CM | POA: Insufficient documentation

## 2013-06-16 DIAGNOSIS — M25569 Pain in unspecified knee: Secondary | ICD-10-CM | POA: Insufficient documentation

## 2013-06-16 DIAGNOSIS — M7989 Other specified soft tissue disorders: Secondary | ICD-10-CM

## 2013-06-16 DIAGNOSIS — Q628 Other congenital malformations of ureter: Secondary | ICD-10-CM | POA: Insufficient documentation

## 2013-06-16 DIAGNOSIS — E669 Obesity, unspecified: Secondary | ICD-10-CM | POA: Insufficient documentation

## 2013-06-16 DIAGNOSIS — M25561 Pain in right knee: Secondary | ICD-10-CM

## 2013-06-16 DIAGNOSIS — Z87442 Personal history of urinary calculi: Secondary | ICD-10-CM | POA: Insufficient documentation

## 2013-06-16 DIAGNOSIS — Z91040 Latex allergy status: Secondary | ICD-10-CM | POA: Insufficient documentation

## 2013-06-16 DIAGNOSIS — Z9889 Other specified postprocedural states: Secondary | ICD-10-CM | POA: Insufficient documentation

## 2013-06-16 DIAGNOSIS — M79605 Pain in left leg: Secondary | ICD-10-CM

## 2013-06-16 DIAGNOSIS — M79609 Pain in unspecified limb: Secondary | ICD-10-CM

## 2013-06-16 DIAGNOSIS — IMO0002 Reserved for concepts with insufficient information to code with codable children: Secondary | ICD-10-CM | POA: Insufficient documentation

## 2013-06-16 DIAGNOSIS — Z79899 Other long term (current) drug therapy: Secondary | ICD-10-CM | POA: Insufficient documentation

## 2013-06-16 MED ORDER — IBUPROFEN 800 MG PO TABS
800.0000 mg | ORAL_TABLET | Freq: Once | ORAL | Status: AC
  Administered 2013-06-16: 800 mg via ORAL
  Filled 2013-06-16: qty 1

## 2013-06-16 MED ORDER — FUROSEMIDE 20 MG PO TABS
40.0000 mg | ORAL_TABLET | Freq: Once | ORAL | Status: AC
  Administered 2013-06-16: 40 mg via ORAL
  Filled 2013-06-16: qty 2

## 2013-06-16 MED ORDER — FUROSEMIDE 40 MG PO TABS: 40.0000 mg | ORAL_TABLET | Freq: Every day | ORAL | Status: DC

## 2013-06-16 NOTE — Telephone Encounter (Signed)
Patient Information:  Caller Name: Delailah  Phone: [phone]  Patient: Melissa Allen, Melissa Allen  Gender: Female  DOB: [birth year]  Age: 47 Years  PCP: Crawford Givens Clelia Croft) [facility])  Pregnant: No  Office Follow Up:  Does the office need to follow up with this patient?: No  Instructions For The Office: N/A  RN Note:  Has been in the office x 2 in the past 2 weeks d/t edema of left leg and foot.  Last appt 06/13/13.  Edema if from below the knee to the top of her foot.  Area is twice the normal size.  Very red and painful.  States is has worsened since being seen in the office.  Nurse advised ED.  Pt refuses and requests appt today.  No appts at [facility] location.  Called office and spoke with Hansel Starling.  Adrienne advised scheduling at [facility] location with Rene Kocher if pt refuses ED.  Appt scheduled for today at 4:15pm.  Symptoms  Reason For Call & Symptoms: Leg edema  Reviewed Health History In EMR: Yes  Reviewed Medications In EMR: Yes  Reviewed Allergies In EMR: Yes  Reviewed Surgeries / Procedures: Yes  Date of Onset of Symptoms: 06/02/2013  Treatments Tried: elevation  Treatments Tried Worked: No OB / GYN:  LMP: Unknown  Guideline(s) Used:  Leg Swelling and Edema  Disposition Per Guideline:   Go to ED Now (or to Office with PCP Approval)  Reason For Disposition Reached:   Thigh or calf pain and only 1 side and present > 1 hour  Advice Given:  Call Back If:  You become worse.  Patient Refused Recommendation:  Patient Refused Care Advice  Pt refuses ED and requests appt.

## 2013-06-16 NOTE — Telephone Encounter (Signed)
error 

## 2013-06-16 NOTE — ED Notes (Signed)
Pt back from x-ray.

## 2013-06-16 NOTE — ED Provider Notes (Signed)
CSN: [id number]     Arrival date & time 06/16/13  1817 History   First MD Initiated Contact with Patient 06/16/13 1827     Chief Complaint  Patient presents with  . Leg Pain    L  . Leg Swelling    L   (Consider location/radiation/quality/duration/timing/severity/associated sxs/prior Treatment) The history is provided by the patient.  Melissa Allen is a 47 y.o. female here with L leg swelling. Left leg swelling progressively worse the last 2 weeks. She accidentally hit her left leg the bed frame several days ago and swelling got worse. She saw her PMD 2 days ago and was not thought to have a DVT. Today saw PMD again and sent here for DVT study. She has some shortness of breath but that's her baseline from her asthma. Denies history of blood clots or recent travel. 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    Past Medical History  Diagnosis Date  . Allergy   . Calcaneal fracture   . Obesity (BMI 30.0-34.9)   . Asthma     recent admit to ER 01/11/12 for exac of asthma  . Hypertension   . Fibromyalgia   . Anginal pain     one episode 4-5 years ago-[facility]-nonspecific-no problems since-  . PONV (postoperative nausea and vomiting)   . Kidney stones   . Duplicated ureter, right    Past Surgical History  Procedure Laterality Date  . Tonsillectomy  age 58  . Bunionectomy  1995    Right  . Cholecystectomy  late 1980's       . Lumbar laminectomy  10/06    L4-5, Ray  . Knee surgery      x 2, Murphy/Sue  . Lithotripsy      Multiple  . R compressed pronator      Sypher  . Tendon repair  2013  . Tendon repair  02/01/12  . Bladder suspension  03/01/2012    Procedure: TRANSVAGINAL TAPE (TVT) PROCEDURE;  Surgeon: Allie Bossier, MD;  Location: [facility];  Service: Gynecology;  Laterality: N/A;  . Cystoscopy  03/01/2012    Procedure: CYSTOSCOPY;  Surgeon: Allie Bossier, MD;  Location: [facility];  Service: Gynecology;  Laterality: N/A;  . Abdominal hysterectomy     Family  History  Problem Relation Age of Onset  . Heart disease Father   . Colon polyps Father   . Colon cancer Paternal Grandfather   . Colon cancer Maternal Grandmother   . Uterine cancer      Grandmother  . Breast cancer      Grandmother  . Ovarian cancer      Grandmother  . Diabetes      mother  . Other Neg Hx   . Cancer Mother     pancreatic cancer   History  Substance Use Topics  . Smoking status: Never Smoker   . Smokeless tobacco: Never Used  . Alcohol Use: 3.5 oz/week    7 drink(s) per week     Comment: Occasional   OB History   Grav Para Term Preterm Abortions TAB SAB Ect Mult Living   3 3 2 1  0 0 0 0 0 3     Review of Systems  Musculoskeletal:       Leg swelling   All other systems reviewed and are negative.    Allergies  Codeine; Hydrocodone; Latex; and Lisinopril  Home Medications   Current Outpatient Rx  Name  Route  Sig  Dispense  Refill  . albuterol (PROAIR HFA) 108 (90 BASE) MCG/ACT inhaler   Inhalation   Inhale 2 puffs into the lungs every 4 (four) hours as needed. Shortness of breath   1 Inhaler   11   . budesonide-formoterol (SYMBICORT) 160-4.5 MCG/ACT inhaler   Inhalation   Inhale 2 puffs into the lungs 2 (two) times daily.   1 Inhaler   0   . cholecalciferol (VITAMIN D) 1000 UNITS tablet   Oral   Take 1,000 Units by mouth daily.         . DULoxetine (CYMBALTA) 60 MG capsule   Oral   Take 60 mg by mouth at bedtime.         . fluticasone (FLONASE) 50 MCG/ACT nasal spray   Nasal   Place 2 sprays into the nose daily as needed for allergies.          Marland Kitchen ipratropium-albuterol (DUONEB) 0.5-2.5 (3) MG/3ML SOLN   Nebulization   Take 3 mLs by nebulization every 4 (four) hours as needed.   360 mL   1   . losartan (COZAAR) 50 MG tablet   Oral   Take 50 mg by mouth daily.         . montelukast (SINGULAIR) 10 MG tablet   Oral   Take 1 tablet (10 mg total) by mouth at bedtime.   30 tablet   5   . thiamine (VITAMIN B-1) 100 MG  tablet   Oral   Take 100 mg by mouth daily.         . traZODone (DESYREL) 100 MG tablet   Oral   Take 100 mg by mouth at bedtime.         . triamterene-hydrochlorothiazide (MAXZIDE-25) 37.5-25 MG per tablet   Oral   Take 1 tablet by mouth daily.          BP 149/98  Pulse 112  Temp(Src) 98.7 F (37.1 C) (Oral)  Resp 18  SpO2 96%  LMP 12/07/2011 Physical Exam  Nursing note and vitals reviewed. Constitutional: She is oriented to person, place, and time. She appears well-developed and well-nourished.  HENT:  Head: Normocephalic.  Mouth/Throat: Oropharynx is clear and moist.  Eyes: Conjunctivae are normal. Pupils are equal, round, and reactive to light.  Neck: Normal range of motion. Neck supple.  Cardiovascular: Normal rate, regular rhythm and normal heart sounds.   Pulmonary/Chest: Effort normal and breath sounds normal. No respiratory distress. She has no wheezes. She has no rales.  Abdominal: Soft. Bowel sounds are normal. She exhibits no distension. There is no tenderness. There is no rebound.  Musculoskeletal:  L leg swollen, mild calf tenderness. + abrasion on lower tibia with slight tenderness but no fluctuance or erythema. 2+ pulses. Nl plantar flexion. L knee nl ROM. 1+ edema R leg no calf tenderness   Neurological: She is alert and oriented to person, place, and time.  Skin: Skin is warm and dry.  Psychiatric: She has a normal mood and affect. Her behavior is normal. Judgment and thought content normal.    ED Course  Procedures (including critical care time) Labs Review Labs Reviewed - No data to display Imaging Review Dg Tibia/fibula Left  06/16/2013   CLINICAL DATA:  Left anterior lower leg swelling.  EXAM: LEFT TIBIA AND FIBULA - 2 VIEW  COMPARISON:  Left knee dated 05/17/2013.  FINDINGS: Distal soft tissue swelling, most pronounced anteriorly. No underlying bony abnormality.  IMPRESSION: Distal soft tissue swelling  without underlying bony abnormality.    Electronically Signed   By: Gordan Payment M.D.   On: 06/16/2013 19:50    EKG Interpretation   None       MDM   1. Left leg swelling   2. Bilateral knee pain    Melissa Allen is a 47 y.o. female here with L leg pain and swelling. Will get duplex to r/o DVT. Will get xray given recent injury. I doubt cellulitis.   8:11 PM Xray showed no fracture. US showed no DVT. I think she likely has dependent edema. Will give short course of lasix for diuresis. She will f/u with PMD to recheck BMP in a week.   Richardean Canal, MD 06/16/13 2012

## 2013-06-16 NOTE — Progress Notes (Signed)
  Subjective:    Patient ID: Melissa Allen, female    DOB: 04-Dec-1965, 47 y.o.   MRN: [id number]  HPI  47 year old female with history of fibromyalgia, HTN presents with worsening left leg edema.  She was seen for B leg edema on 10/7 by PCP... felt no DVT, recommended elevation and stopping amlodipine ( did not help at all)  Not SOB and no orthopnea. No fevers. No CP, no leg trauma (except hit front of ankle 2 weeks ago on metal bed frame). Last note reviewed. No NSAID use. BMET wnl prev.    Usually right leg is a little swollen but left leg is very tender and very swollen. She is having pain in calf and ankle and foot.   BP Readings from Last 3 Encounters:  06/16/13 110/80  06/14/13 126/96  05/27/13 124/84   No recent on trip, No recent surgery. No change in mobility.  Pulse is elevated today.  Nonsmoker, no OCPs.     Review of Systems  Constitutional: Negative for fever and fatigue.  HENT: Negative for ear pain.   Eyes: Negative for pain.  Respiratory: Negative for chest tightness and shortness of breath.   Cardiovascular: Negative for chest pain, palpitations and leg swelling.  Gastrointestinal: Negative for abdominal pain.  Genitourinary: Negative for dysuria.       Objective:   Physical Exam  Constitutional: Vital signs are normal. She appears well-developed and well-nourished. She is cooperative.  Non-toxic appearance. She does not appear ill. No distress.  HENT:  Head: Normocephalic.  Right Ear: Hearing, tympanic membrane, external ear and ear canal normal. Tympanic membrane is not erythematous, not retracted and not bulging.  Left Ear: Hearing, tympanic membrane, external ear and ear canal normal. Tympanic membrane is not erythematous, not retracted and not bulging.  Nose: No mucosal edema or rhinorrhea. Right sinus exhibits no maxillary sinus tenderness and no frontal sinus tenderness. Left sinus exhibits no maxillary sinus tenderness and no frontal sinus tenderness.   Mouth/Throat: Uvula is midline, oropharynx is clear and moist and mucous membranes are normal.  Eyes: Conjunctivae, EOM and lids are normal. Pupils are equal, round, and reactive to light. Lids are everted and swept, no foreign bodies found.  Neck: Trachea normal and normal range of motion. Neck supple. Carotid bruit is not present. No mass and no thyromegaly present.  Cardiovascular: Normal rate, regular rhythm, S1 normal, S2 normal, normal heart sounds, intact distal pulses and normal pulses.  Exam reveals no gallop and no friction rub.   No murmur heard. Left leg swollen and tender in calf and ankle and foot, increase warmth.  Pulmonary/Chest: Effort normal and breath sounds normal. Not tachypneic. No respiratory distress. She has no decreased breath sounds. She has no wheezes. She has no rhonchi. She has no rales.  Abdominal: Soft. Normal appearance and bowel sounds are normal. There is no tenderness.  Neurological: She is alert.  Skin: Skin is warm, dry and intact. No rash noted.  Psychiatric: Her speech is normal and behavior is normal. Judgment and thought content normal. Her mood appears not anxious. Cognition and memory are normal. She does not exhibit a depressed mood.          Assessment & Plan:  Left leg swelling and severe pain, increase warmth. Worsening. Concern for DVT. Sen for doppler STAT.  No clear triggers.  Only miuld chrinic swelling in right leg.

## 2013-06-16 NOTE — Patient Instructions (Addendum)
Stop at front to get [place] scheduled for today.

## 2013-06-16 NOTE — ED Notes (Addendum)
Pt reports swelling and LLE tenderness x 2 weeks that has progressively gotten worse and moved up her leg to the back of her calf. Pt verbalizes "she may have hit it on a bed frame." LLE and Foot are edematous and and TTP.

## 2013-06-16 NOTE — ED Notes (Signed)
US at bedside

## 2013-06-16 NOTE — Progress Notes (Signed)
Left lower extremity venous duplex completed.  Left:  No evidence of DVT, superficial thrombosis, or Baker's cyst.  Right:  Negative for DVT in the common femoral vein.  

## 2013-06-16 NOTE — ED Notes (Signed)
Pt taken to xray 

## 2013-06-22 ENCOUNTER — Other Ambulatory Visit: Payer: Self-pay | Admitting: Family Medicine

## 2013-06-22 ENCOUNTER — Other Ambulatory Visit: Payer: Self-pay | Admitting: Internal Medicine

## 2013-06-22 NOTE — Telephone Encounter (Signed)
Last filled 05/21/2013

## 2013-06-30 ENCOUNTER — Ambulatory Visit (INDEPENDENT_AMBULATORY_CARE_PROVIDER_SITE_OTHER): Payer: BC Managed Care – PPO | Admitting: Family Medicine

## 2013-06-30 ENCOUNTER — Encounter: Payer: Self-pay | Admitting: Family Medicine

## 2013-06-30 VITALS — BP 124/80 | HR 107 | Temp 98.5°F | Ht 64.0 in | Wt 193.5 lb

## 2013-06-30 DIAGNOSIS — M79605 Pain in left leg: Secondary | ICD-10-CM

## 2013-06-30 DIAGNOSIS — M7989 Other specified soft tissue disorders: Secondary | ICD-10-CM

## 2013-06-30 DIAGNOSIS — M79609 Pain in unspecified limb: Secondary | ICD-10-CM

## 2013-06-30 MED ORDER — DOXYCYCLINE HYCLATE 100 MG PO TABS: 100.0000 mg | ORAL_TABLET | Freq: Two times a day (BID) | ORAL | Status: DC

## 2013-06-30 NOTE — Assessment & Plan Note (Signed)
Now swelling is very different from before.. Very focal almost cystic like swelling in left anteriro calf.  concerning for infeciton.  Will treat with doxy x 10 days to cover for MRSA ( daughter has had MRSA in past). If not improving with antibitoics will consider repeat US but over area of concern or CT left leg. Discussed plan with Dr. Para March who will follow up with her early next week.

## 2013-06-30 NOTE — Progress Notes (Signed)
  Subjective:    Patient ID: Melissa Allen, female    DOB: 1965/12/13, 47 y.o.   MRN: [id number]  HPI  47 year old female with history of fibromyalgia, HTN presents with worsening left leg edema and pain.  She was seen for B leg edema on 10/7 by PCP... felt no DVT, recommended elevation and stopping amlodipine ( did not help at all). Not SOB and no orthopnea. No fevers. No CP, no leg trauma (except hit front of ankle 4 weeks ago on metal bed frame, she also had an abrasion  months ago in same area). Last note reviewed. No NSAID use. BMET wnl prev.   She had negative dopplers and neg X-ray tib/fib... On 10/9.  At this point swelling is primarily over left anterior tibia. Area is red and slightly bruised.  No known bites.  She is elevating it rarely. She cannot use lasix given it makes her pee too much.  Using ibuprofen occasionally, but not helping. Tramadol doesn't help either.    Review of Systems  Constitutional: Negative for fever and fatigue.  HENT: Negative for ear pain.   Eyes: Negative for pain.  Respiratory: Negative for chest tightness and shortness of breath.   Cardiovascular: Positive for leg swelling. Negative for chest pain and palpitations.  Gastrointestinal: Negative for abdominal pain.  Genitourinary: Negative for dysuria.       Objective:   Physical Exam  Constitutional: Vital signs are normal. She appears well-developed and well-nourished. She is cooperative.  Non-toxic appearance. She does not appear ill. No distress.  HENT:  Head: Normocephalic.  Right Ear: Hearing, tympanic membrane, external ear and ear canal normal. Tympanic membrane is not erythematous, not retracted and not bulging.  Left Ear: Hearing, tympanic membrane, external ear and ear canal normal. Tympanic membrane is not erythematous, not retracted and not bulging.  Nose: No mucosal edema or rhinorrhea. Right sinus exhibits no maxillary sinus tenderness and no frontal sinus tenderness. Left  sinus exhibits no maxillary sinus tenderness and no frontal sinus tenderness.  Mouth/Throat: Uvula is midline, oropharynx is clear and moist and mucous membranes are normal.  Eyes: Conjunctivae, EOM and lids are normal. Pupils are equal, round, and reactive to light. Lids are everted and swept, no foreign bodies found.  Neck: Trachea normal and normal range of motion. Neck supple. Carotid bruit is not present. No mass and no thyromegaly present.  Cardiovascular: Normal rate, regular rhythm, S1 normal, S2 normal, normal heart sounds, intact distal pulses and normal pulses.  Exam reveals no gallop and no friction rub.   No murmur heard. Bilateral slight edema  Diffusely in ankles  Pulmonary/Chest: Effort normal and breath sounds normal. Not tachypneic. No respiratory distress. She has no decreased breath sounds. She has no wheezes. She has no rhonchi. She has no rales.  Abdominal: Soft. Normal appearance and bowel sounds are normal. There is no tenderness.  Neurological: She is alert.  Skin: Skin is warm, dry and intact. No rash noted.  Focal area of swelling  Left tibia... Diameter of calf below swelling 21 cm... Diameter at swelling 23 cm  Very tender, minimal erythema, slight warmth. No central pore  Psychiatric: Her speech is normal and behavior is normal. Judgment and thought content normal. Her mood appears not anxious. Cognition and memory are normal. She does not exhibit a depressed mood.          Assessment & Plan:

## 2013-06-30 NOTE — Patient Instructions (Addendum)
Start and complete antibiotics x 10 days.  Elevated leg above heart when able.  Follow up on Monday with Dr, Para March.

## 2013-07-04 ENCOUNTER — Ambulatory Visit
Admission: RE | Admit: 2013-07-04 | Discharge: 2013-07-04 | Disposition: A | Discharge status: Home / Self Care | Payer: BC Managed Care – PPO | Source: Ambulatory Visit | Attending: Family Medicine | Admitting: Family Medicine

## 2013-07-04 ENCOUNTER — Encounter: Payer: Self-pay | Admitting: Family Medicine

## 2013-07-04 ENCOUNTER — Ambulatory Visit (INDEPENDENT_AMBULATORY_CARE_PROVIDER_SITE_OTHER): Payer: BC Managed Care – PPO | Admitting: Family Medicine

## 2013-07-04 VITALS — BP 118/78 | HR 88 | Temp 98.4°F | Wt 183.0 lb

## 2013-07-04 DIAGNOSIS — M79609 Pain in unspecified limb: Secondary | ICD-10-CM

## 2013-07-04 DIAGNOSIS — M79605 Pain in left leg: Secondary | ICD-10-CM

## 2013-07-04 MED ORDER — GADOBENATE DIMEGLUMINE 529 MG/ML IV SOLN
17.0000 mL | Freq: Once | INTRAVENOUS | Status: AC | PRN
  Administered 2013-07-04: 17 mL via INTRAVENOUS

## 2013-07-04 NOTE — Assessment & Plan Note (Signed)
I called [facility] and MR w/ and w/o would be recommended.   Will arrange for imaging.  Continue abx for now.  D/w pt.

## 2013-07-04 NOTE — Progress Notes (Signed)
Lump on anterior L lower shin isn't better.  No fevers.  On abx, but not clearly improved.  No R sided sx.  Still tender.    Meds, vitals, and allergies reviewed.   ROS: See HPI.  Otherwise, noncontributory.  nad ncat rrr ctab L lower shin with lump on anterior side, not red but ttp.  Distally nv intact but pain at the lump with ROM of the foot.

## 2013-07-04 NOTE — Patient Instructions (Signed)
Go see Shirlee Limerick about your referral. Take care.  We'll be in touch.

## 2013-07-06 ENCOUNTER — Ambulatory Visit (INDEPENDENT_AMBULATORY_CARE_PROVIDER_SITE_OTHER): Payer: BC Managed Care – PPO | Admitting: Family Medicine

## 2013-07-06 ENCOUNTER — Encounter: Payer: Self-pay | Admitting: Family Medicine

## 2013-07-06 VITALS — BP 124/88 | HR 96 | Temp 98.4°F

## 2013-07-06 DIAGNOSIS — M79605 Pain in left leg: Secondary | ICD-10-CM

## 2013-07-06 DIAGNOSIS — M79609 Pain in unspecified limb: Secondary | ICD-10-CM

## 2013-07-06 MED ORDER — OXYCODONE HCL 5 MG PO TABS: 2.5000 mg | ORAL_TABLET | Freq: Three times a day (TID) | ORAL | Status: DC | PRN

## 2013-07-06 NOTE — Patient Instructions (Signed)
Try to keep your leg elevated as much as possible, use the oxycodone for pain (1/2 tab at a time) and let me talk with Dr. Ermalene Searing.  We'll be in touch as soon as possible.  Take care.

## 2013-07-07 ENCOUNTER — Encounter: Payer: Self-pay | Admitting: Family Medicine

## 2013-07-07 NOTE — Assessment & Plan Note (Signed)
No complications to unsuccessful aspiration.  I was in the correct area with the needle placement.  The needle wasn't obstructed.  The material in the mass is likely to viscous or not liquid for aspiration.  I didn't perform wider I&D.  D/w pt.  Will d/w Dr. Ermalene Searing.  Given oxycodone for prn pain control, use 1/2 tab as needed.  We may need to refer to surgery for eval if this isn't self resolving.   Will notify pt after talking with Dr. Ermalene Searing.

## 2013-07-07 NOTE — Progress Notes (Signed)
Continues to have pain at the mass on the L anterior shin.  No fevers.  Still on abx.  MR reviewed, thought to be liquifed blood.  Here for attempted aspiration.  Informed consent d/w pt.  Discussed possible infection, bleeding, damage to nearby organs and possibly "dry tap".  She agreed to proceed as draining would likely improved her comfort level.   Meds, vitals, and allergies reviewed.   ROS: See HPI.  Otherwise, noncontributory.  nad Localized tender swelling on L anterior shin.    Prepped and draped, etoh and betadine.  Topical anesthesia with ethyl chloride Sterile tech- 18g needed advanced into the mass x2, unable to aspirate any material.  Procedure aborted, area dressed.  Complications.

## 2013-07-08 ENCOUNTER — Telehealth: Payer: Self-pay | Admitting: Family Medicine

## 2013-07-08 DIAGNOSIS — R2240 Localized swelling, mass and lump, unspecified lower limb: Secondary | ICD-10-CM

## 2013-07-08 NOTE — Telephone Encounter (Signed)
Please call pt.  I talked with Dr. Ermalene Searing about this.  We both agree that if still bothersome then we should set her up with general surgery.  The referral is in.  Thanks.

## 2013-07-08 NOTE — Telephone Encounter (Signed)
Patient advised.   She will call back on Monday with the name of who she would like to see.

## 2013-07-12 ENCOUNTER — Other Ambulatory Visit: Payer: Self-pay | Admitting: Family Medicine

## 2013-07-12 NOTE — Telephone Encounter (Signed)
Ok to refill, potassium normal

## 2013-07-12 NOTE — Telephone Encounter (Signed)
See Warnings on Medication.  Ok to refill?

## 2013-07-14 ENCOUNTER — Other Ambulatory Visit: Payer: Self-pay

## 2013-07-18 ENCOUNTER — Telehealth (INDEPENDENT_AMBULATORY_CARE_PROVIDER_SITE_OTHER): Payer: Self-pay

## 2013-07-18 ENCOUNTER — Encounter (HOSPITAL_BASED_OUTPATIENT_CLINIC_OR_DEPARTMENT_OTHER): Payer: Self-pay | Admitting: *Deleted

## 2013-07-18 ENCOUNTER — Ambulatory Visit (INDEPENDENT_AMBULATORY_CARE_PROVIDER_SITE_OTHER): Payer: BC Managed Care – PPO | Admitting: Surgery

## 2013-07-18 ENCOUNTER — Encounter (INDEPENDENT_AMBULATORY_CARE_PROVIDER_SITE_OTHER): Payer: Self-pay | Admitting: Surgery

## 2013-07-18 VITALS — BP 142/82 | HR 86 | Resp 18 | Ht 64.0 in | Wt 194.0 lb

## 2013-07-18 DIAGNOSIS — S8012XA Contusion of left lower leg, initial encounter: Secondary | ICD-10-CM

## 2013-07-18 DIAGNOSIS — S8010XA Contusion of unspecified lower leg, initial encounter: Secondary | ICD-10-CM

## 2013-07-18 DIAGNOSIS — M7989 Other specified soft tissue disorders: Secondary | ICD-10-CM

## 2013-07-18 NOTE — Progress Notes (Signed)
Patient ID: Melissa Allen, female   DOB: 12/31/1965, 47 y.o.   MRN: [id number]  Chief Complaint  Patient presents with  . Other    Eval left leg mass    HPI Melissa Allen is a 47 y.o. female.  Referred by Dr. Graham Duncan for evaluation of left lower extremity swelling  HPI This is a 47-year-old female who presents with a 4 week history of painful swelling in her left lower extremity. She remembers a history of bumping her left shin on the side of the bed during the night about 5 weeks ago. She denies any significant swelling or bruising in this area but a few days later it began swelling and has become extremely tight and painful.Plain films showed no sign of fracture.  Her study was negative for lower extremity DVT. An MRI showed a fluid collection in this area consistent with a hematoma. Attempts at aspiration were unsuccessful.  Past Medical History  Diagnosis Date  . Allergy   . Calcaneal fracture   . Obesity (BMI 30.0-34.9)   . Asthma     recent admit to ER 01/11/12 for exac of asthma  . Hypertension   . Fibromyalgia   . Anginal pain     one episode 4-5 years ago-[facility] [place]-nonspecific-no problems since-  . PONV (postoperative nausea and vomiting)   . Kidney stones   . Duplicated ureter, right     Past Surgical History  Procedure Laterality Date  . Tonsillectomy  age 21  . Bunionectomy  1995    Right  . Cholecystectomy  late 1980's       . Lumbar laminectomy  10/06    L4-5, Ray  . Knee surgery      x 2, Murphy/Sue  . Lithotripsy      Multiple  . R compressed pronator      Sypher  . Tendon repair  2013  . Tendon repair  02/01/12  . Bladder suspension  03/01/2012    Procedure: TRANSVAGINAL TAPE (TVT) PROCEDURE;  Surgeon: Myra C Dove, MD;  Location: [facility];  Service: Gynecology;  Laterality: N/A;  . Cystoscopy  03/01/2012    Procedure: CYSTOSCOPY;  Surgeon: Myra C Dove, MD;  Location: [facility];  Service: Gynecology;  Laterality: N/A;  . Abdominal  hysterectomy      Family History  Problem Relation Age of Onset  . Heart disease Father   . Colon polyps Father   . Colon cancer Paternal Grandfather   . Colon cancer Maternal Grandmother   . Uterine cancer      Grandmother  . Breast cancer      Grandmother  . Ovarian cancer      Grandmother  . Diabetes      mother  . Other Neg Hx   . Cancer Mother     pancreatic cancer    Social History History  Substance Use Topics  . Smoking status: Never Smoker   . Smokeless tobacco: Never Used  . Alcohol Use: 3.5 oz/week    7 drink(s) per week     Comment: Occasional    Allergies  Allergen Reactions  . Codeine     REACTION: vomiting  . Hydrocodone Nausea And Vomiting  . Latex Hives  . Lisinopril     Cough     Current Outpatient Prescriptions  Medication Sig Dispense Refill  . albuterol (PROAIR HFA) 108 (90 BASE) MCG/ACT inhaler Inhale 2 puffs into the lungs every 4 (four) hours as needed. Shortness of breath    1 Inhaler  11  . budesonide-formoterol (SYMBICORT) 160-4.5 MCG/ACT inhaler Inhale 2 puffs into the lungs 2 (two) times daily.  1 Inhaler  0  . cholecalciferol (VITAMIN D) 1000 UNITS tablet Take 1,000 Units by mouth daily.      . DULoxetine (CYMBALTA) 60 MG capsule Take 60 mg by mouth at bedtime.      . furosemide (LASIX) 40 MG tablet Take 1 tablet (40 mg total) by mouth daily.  15 tablet  0  . ipratropium-albuterol (DUONEB) 0.5-2.5 (3) MG/3ML SOLN Take 3 mLs by nebulization every 4 (four) hours as needed.  360 mL  1  . losartan (COZAAR) 50 MG tablet TAKE ONE TABLET BY MOUTH ONE TIME DAILY   30 tablet  5  . montelukast (SINGULAIR) 10 MG tablet Take 1 tablet (10 mg total) by mouth at bedtime.  30 tablet  5  . oxyCODONE (ROXICODONE) 5 MG immediate release tablet Take 0.5 tablets (2.5 mg total) by mouth every 8 (eight) hours as needed for pain.  20 tablet  0  . thiamine (VITAMIN B-1) 100 MG tablet Take 100 mg by mouth daily.      . traZODone (DESYREL) 100 MG tablet Take  one tablet by mouth   nightly at bedtime  30 tablet  10  . triamterene-hydrochlorothiazide (MAXZIDE-25) 37.5-25 MG per tablet Take 1 tablet by mouth daily.      . doxycycline (VIBRA-TABS) 100 MG tablet Take 1 tablet (100 mg total) by mouth 2 (two) times daily.  20 tablet  0  . fluticasone (FLONASE) 50 MCG/ACT nasal spray Place 2 sprays into the nose daily as needed for allergies.        No current facility-administered medications for this visit.    Review of Systems Review of Systems  Constitutional: Negative for fever, chills and unexpected weight change.  HENT: Negative for congestion, hearing loss, sore throat, trouble swallowing and voice change.   Eyes: Negative for visual disturbance.  Respiratory: Negative for cough and wheezing.   Cardiovascular: Negative for chest pain, palpitations and leg swelling.  Gastrointestinal: Negative for nausea, vomiting, abdominal pain, diarrhea, constipation, blood in stool, abdominal distention and anal bleeding.  Genitourinary: Negative for hematuria, vaginal bleeding and difficulty urinating.  Musculoskeletal: Positive for gait problem and myalgias. Negative for arthralgias.  Skin: Negative for rash and wound.  Neurological: Negative for seizures, syncope and headaches.  Hematological: Negative for adenopathy. Does not bruise/bleed easily.  Psychiatric/Behavioral: Negative for confusion.    Blood pressure 142/82, pulse 86, resp. rate 18, height 5' 4" (1.626 m), weight 194 lb (87.998 kg), last menstrual period 12/07/2011.  Physical Exam Physical Exam WDWN in NAD HEENT:  EOMI, sclera anicteric Neck:  No masses, no thyromegaly Lungs:  CTA bilaterally; normal respiratory effort CV:  Regular rate and rhythm; no murmurs Abd:  +bowel sounds, soft, non-tender, no masses Ext:  Well-perfused; no edema;  Anterior LLE shows 5 cm area of swelling with slight erythema;  Very tender to palpation Skin - warm and dry with no sign of jaundice  Data  Reviewed Mr Tibia Fibula Left W Wo Contrast  07/05/2013   CLINICAL DATA:  Injured leg 6 weeks ago. Persistent pain and swelling.  EXAM: MRI OF LOWER LEFT EXTREMITY WITHOUT AND WITH CONTRAST  TECHNIQUE: Multiplanar, multisequence MR imaging of the lower left extremity was performed both before and after administration of intravenous contrast.  CONTRAST:  17mL MULTIHANCE GADOBENATE DIMEGLUMINE 529 MG/ML IV SOLN  COMPARISON:  06/16/2013 radiographs.  FINDINGS: There is   a moderate-sized fluid collection overlying the distal tibial shaft. This measures 5.4 x 3.7 x 1.5 cm. No significant discussed that minimal rim-like enhancement is noted. I think this is most likely a liquified hematoma given history of previous trauma. Minimal surrounding soft tissue inflammation. The underlying tibia is normal. No fracture. The anterior tendons are intact. The ankle joints are unremarkable.  IMPRESSION: 5 cm fluid collection overlying the anterior aspect of the distal tibial shaft is most likely a liquified hematoma.  No acute bony findings.   Electronically Signed   By: Mark  Gallerani M.D.   On: 07/05/2013 10:48      Assessment    Hematoma - left anterior left leg    Plan    Incision and drainage of left leg hematoma with possible drain placement.  The surgical procedure has been discussed with the patient.  Potential risks, benefits, alternative treatments, and expected outcomes have been explained.  All of the patient's questions at this time have been answered.  The likelihood of reaching the patient's treatment goal is good.  The patient understand the proposed surgical procedure and wishes to proceed.        Timberlyn Pickford K. 07/18/2013, 3:24 PM    

## 2013-07-18 NOTE — Progress Notes (Signed)
Add on for tomorrow-will need istat and ekg-

## 2013-07-18 NOTE — Telephone Encounter (Signed)
Spoke to pt about moving her appt time up today per the request of Dr Corliss Skains for her to arrive at 2:15 to register. 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 The pt will call back to ask for me today.

## 2013-07-19 ENCOUNTER — Encounter (HOSPITAL_BASED_OUTPATIENT_CLINIC_OR_DEPARTMENT_OTHER): Payer: BC Managed Care – PPO | Admitting: Anesthesiology

## 2013-07-19 ENCOUNTER — Ambulatory Visit (HOSPITAL_BASED_OUTPATIENT_CLINIC_OR_DEPARTMENT_OTHER): Payer: BC Managed Care – PPO | Admitting: Anesthesiology

## 2013-07-19 ENCOUNTER — Ambulatory Visit (HOSPITAL_BASED_OUTPATIENT_CLINIC_OR_DEPARTMENT_OTHER)
Admission: RE | Admit: 2013-07-19 | Discharge: 2013-07-19 | Disposition: A | Discharge status: Home / Self Care | Payer: BC Managed Care – PPO | Source: Ambulatory Visit | Attending: Surgery | Admitting: Surgery

## 2013-07-19 ENCOUNTER — Encounter (HOSPITAL_BASED_OUTPATIENT_CLINIC_OR_DEPARTMENT_OTHER): Payer: Self-pay | Admitting: *Deleted

## 2013-07-19 ENCOUNTER — Encounter (HOSPITAL_BASED_OUTPATIENT_CLINIC_OR_DEPARTMENT_OTHER)
Admission: RE | Disposition: A | Discharge status: Home / Self Care | Payer: Self-pay | Source: Ambulatory Visit | Attending: Surgery

## 2013-07-19 DIAGNOSIS — IMO0002 Reserved for concepts with insufficient information to code with codable children: Secondary | ICD-10-CM | POA: Insufficient documentation

## 2013-07-19 DIAGNOSIS — J45909 Unspecified asthma, uncomplicated: Secondary | ICD-10-CM | POA: Insufficient documentation

## 2013-07-19 DIAGNOSIS — S8012XA Contusion of left lower leg, initial encounter: Secondary | ICD-10-CM

## 2013-07-19 DIAGNOSIS — E669 Obesity, unspecified: Secondary | ICD-10-CM | POA: Insufficient documentation

## 2013-07-19 DIAGNOSIS — IMO0001 Reserved for inherently not codable concepts without codable children: Secondary | ICD-10-CM | POA: Insufficient documentation

## 2013-07-19 DIAGNOSIS — Z79899 Other long term (current) drug therapy: Secondary | ICD-10-CM | POA: Insufficient documentation

## 2013-07-19 DIAGNOSIS — R229 Localized swelling, mass and lump, unspecified: Secondary | ICD-10-CM | POA: Insufficient documentation

## 2013-07-19 DIAGNOSIS — S8010XA Contusion of unspecified lower leg, initial encounter: Secondary | ICD-10-CM | POA: Insufficient documentation

## 2013-07-19 DIAGNOSIS — I10 Essential (primary) hypertension: Secondary | ICD-10-CM | POA: Insufficient documentation

## 2013-07-19 DIAGNOSIS — T148XXA Other injury of unspecified body region, initial encounter: Secondary | ICD-10-CM

## 2013-07-19 HISTORY — DX: Other specified disorders of veins: I87.8

## 2013-07-19 HISTORY — DX: Presence of spectacles and contact lenses: Z97.3

## 2013-07-19 HISTORY — PX: INCISION AND DRAINAGE ABSCESS: SHX5864

## 2013-07-19 SURGERY — INCISION AND DRAINAGE, ABSCESS
Anesthesia: General | Site: Leg Lower | Laterality: Left | Wound class: Clean

## 2013-07-19 MED ORDER — BUPIVACAINE-EPINEPHRINE 0.25% -1:200000 IJ SOLN
INTRAMUSCULAR | Status: DC | PRN
  Administered 2013-07-19: 10 mL

## 2013-07-19 MED ORDER — FENTANYL CITRATE 0.05 MG/ML IJ SOLN
INTRAMUSCULAR | Status: AC
  Filled 2013-07-19: qty 6

## 2013-07-19 MED ORDER — OXYCODONE HCL 5 MG/5ML PO SOLN: 5.0000 mg | Freq: Once | ORAL | Status: DC | PRN

## 2013-07-19 MED ORDER — ONDANSETRON HCL 4 MG/2ML IJ SOLN: INTRAMUSCULAR | Status: DC | PRN

## 2013-07-19 MED ORDER — PROPOFOL 10 MG/ML IV BOLUS
INTRAVENOUS | Status: DC | PRN
  Administered 2013-07-19: 270 mg via INTRAVENOUS

## 2013-07-19 MED ORDER — BUPIVACAINE-EPINEPHRINE PF 0.25-1:200000 % IJ SOLN
INTRAMUSCULAR | Status: AC
  Filled 2013-07-19: qty 30

## 2013-07-19 MED ORDER — OXYCODONE-ACETAMINOPHEN 5-325 MG PO TABS: 1.0000 | ORAL_TABLET | ORAL | Status: DC | PRN

## 2013-07-19 MED ORDER — FENTANYL CITRATE 0.05 MG/ML IJ SOLN
INTRAMUSCULAR | Status: DC | PRN
  Administered 2013-07-19: 100 ug via INTRAVENOUS

## 2013-07-19 MED ORDER — PROPOFOL 10 MG/ML IV BOLUS
INTRAVENOUS | Status: AC
  Filled 2013-07-19: qty 20

## 2013-07-19 MED ORDER — DEXAMETHASONE SODIUM PHOSPHATE 4 MG/ML IJ SOLN
INTRAMUSCULAR | Status: DC | PRN
  Administered 2013-07-19: 10 mg via INTRAVENOUS

## 2013-07-19 MED ORDER — HYDROMORPHONE HCL PF 1 MG/ML IJ SOLN
INTRAMUSCULAR | Status: AC
  Filled 2013-07-19: qty 1

## 2013-07-19 MED ORDER — ONDANSETRON HCL 4 MG/2ML IJ SOLN: 4.0000 mg | Freq: Once | INTRAMUSCULAR | Status: DC | PRN

## 2013-07-19 MED ORDER — MIDAZOLAM HCL 2 MG/2ML IJ SOLN
INTRAMUSCULAR | Status: AC
  Filled 2013-07-19: qty 2

## 2013-07-19 MED ORDER — HYDROMORPHONE HCL PF 1 MG/ML IJ SOLN
INTRAMUSCULAR | Status: AC
Start: 2013-07-19 — End: 2013-07-19
  Filled 2013-07-19: qty 1

## 2013-07-19 MED ORDER — LIDOCAINE HCL (CARDIAC) 20 MG/ML IV SOLN
INTRAVENOUS | Status: DC | PRN
  Administered 2013-07-19: 50 mg via INTRAVENOUS

## 2013-07-19 MED ORDER — OXYCODONE HCL 5 MG PO TABS: 5.0000 mg | ORAL_TABLET | Freq: Once | ORAL | Status: DC | PRN

## 2013-07-19 MED ORDER — MIDAZOLAM HCL 5 MG/5ML IJ SOLN
INTRAMUSCULAR | Status: DC | PRN
  Administered 2013-07-19: 2 mg via INTRAVENOUS

## 2013-07-19 MED ORDER — ONDANSETRON HCL 4 MG/2ML IJ SOLN
INTRAMUSCULAR | Status: DC | PRN
  Administered 2013-07-19: 4 mg via INTRAVENOUS

## 2013-07-19 MED ORDER — LACTATED RINGERS IV SOLN
INTRAVENOUS | Status: DC
  Administered 2013-07-19: 12:00:00 via INTRAVENOUS

## 2013-07-19 MED ORDER — HYDROMORPHONE HCL PF 1 MG/ML IJ SOLN
0.2500 mg | INTRAMUSCULAR | Status: DC | PRN
  Administered 2013-07-19 (×4): 0.5 mg via INTRAVENOUS

## 2013-07-19 MED ORDER — ONDANSETRON HCL 4 MG/2ML IJ SOLN: 4.0000 mg | INTRAMUSCULAR | Status: DC | PRN

## 2013-07-19 MED ORDER — MORPHINE SULFATE 2 MG/ML IJ SOLN: 2.0000 mg | INTRAMUSCULAR | Status: DC | PRN

## 2013-07-19 MED ORDER — CEFAZOLIN SODIUM 1-5 GM-% IV SOLN
INTRAVENOUS | Status: AC
  Filled 2013-07-19: qty 100

## 2013-07-19 MED ORDER — HYDROMORPHONE HCL PF 1 MG/ML IJ SOLN: 0.2500 mg | INTRAMUSCULAR | Status: DC | PRN

## 2013-07-19 SURGICAL SUPPLY — 44 items
BANDAGE CONFORM 3  STR LF (GAUZE/BANDAGES/DRESSINGS) ×2 IMPLANT
BENZOIN TINCTURE PRP APPL 2/3 (GAUZE/BANDAGES/DRESSINGS) IMPLANT
BLADE HEX COATED 2.75 (ELECTRODE) ×2 IMPLANT
BLADE SURG 15 STRL LF DISP TIS (BLADE) ×1 IMPLANT
BLADE SURG 15 STRL SS (BLADE) ×1
BNDG COHESIVE 3X5 TAN STRL LF (GAUZE/BANDAGES/DRESSINGS) ×2 IMPLANT
CANISTER SUCT 1200ML W/VALVE (MISCELLANEOUS) IMPLANT
CHLORAPREP W/TINT 26ML (MISCELLANEOUS) ×2 IMPLANT
COVER MAYO STAND STRL (DRAPES) ×2 IMPLANT
COVER TABLE BACK 60X90 (DRAPES) ×2 IMPLANT
DECANTER SPIKE VIAL GLASS SM (MISCELLANEOUS) IMPLANT
DRAIN PENROSE 1/4X12 LTX STRL (WOUND CARE) ×2 IMPLANT
DRAPE PED LAPAROTOMY (DRAPES) ×2 IMPLANT
DRAPE UTILITY XL STRL (DRAPES) ×2 IMPLANT
DRSG TEGADERM 4X4.75 (GAUZE/BANDAGES/DRESSINGS) IMPLANT
ELECT REM PT RETURN 9FT ADLT (ELECTROSURGICAL) ×2
ELECTRODE REM PT RTRN 9FT ADLT (ELECTROSURGICAL) ×1 IMPLANT
GAUZE SPONGE 4X4 12PLY STRL LF (GAUZE/BANDAGES/DRESSINGS) ×2 IMPLANT
GLOVE BIO SURGEON STRL SZ7 (GLOVE) IMPLANT
GLOVE BIOGEL PI IND STRL 7.5 (GLOVE) IMPLANT
GLOVE BIOGEL PI INDICATOR 7.5 (GLOVE)
GLOVE SURG SS PI 6.5 STRL IVOR (GLOVE) ×2 IMPLANT
GLOVE SURG SS PI 7.0 STRL IVOR (GLOVE) ×2 IMPLANT
GOWN PREVENTION PLUS XLARGE (GOWN DISPOSABLE) ×4 IMPLANT
NEEDLE HYPO 25X1 1.5 SAFETY (NEEDLE) ×2 IMPLANT
NS IRRIG 1000ML POUR BTL (IV SOLUTION) ×2 IMPLANT
PACK BASIN DAY SURGERY FS (CUSTOM PROCEDURE TRAY) ×2 IMPLANT
PENCIL BUTTON HOLSTER BLD 10FT (ELECTRODE) ×2 IMPLANT
SLEEVE SCD COMPRESS KNEE MED (MISCELLANEOUS) IMPLANT
SPONGE LAP 4X18 X RAY DECT (DISPOSABLE) ×2 IMPLANT
STRIP CLOSURE SKIN 1/2X4 (GAUZE/BANDAGES/DRESSINGS) IMPLANT
SUT CHROMIC 3 0 SH 27 (SUTURE) IMPLANT
SUT ETHILON 2 0 FS 18 (SUTURE) ×2 IMPLANT
SUT MON AB 4-0 PC3 18 (SUTURE) IMPLANT
SUT VIC AB 3-0 SH 27 (SUTURE) ×1
SUT VIC AB 3-0 SH 27X BRD (SUTURE) ×1 IMPLANT
SWAB COLLECTION DEVICE MRSA (MISCELLANEOUS) IMPLANT
SYR BULB 3OZ (MISCELLANEOUS) ×2 IMPLANT
SYR CONTROL 10ML LL (SYRINGE) ×2 IMPLANT
TOWEL OR 17X24 6PK STRL BLUE (TOWEL DISPOSABLE) ×2 IMPLANT
TOWEL OR NON WOVEN STRL DISP B (DISPOSABLE) IMPLANT
TUBE ANAEROBIC SPECIMEN COL (MISCELLANEOUS) IMPLANT
TUBE CONNECTING 20X1/4 (TUBING) IMPLANT
YANKAUER SUCT BULB TIP NO VENT (SUCTIONS) ×2 IMPLANT

## 2013-07-19 NOTE — Interval H&P Note (Signed)
History and Physical Interval Note:  07/19/2013 12:44 PM  Melissa Allen  has presented today for surgery, with the diagnosis of left lower exterimity hematoma  The various methods of treatment have been discussed with the patient and family. After consideration of risks, benefits and other options for treatment, the patient has consented to  Procedure(s): INCISION AND DRAINAGE LEFT LOWER EXTERMITY HEMATOMA (Left) as a surgical intervention .  The patient's history has been reviewed, patient examined, no change in status, stable for surgery.  I have reviewed the patient's chart and labs.  Questions were answered to the patient's satisfaction.     Clary Meeker K.

## 2013-07-19 NOTE — Anesthesia Preprocedure Evaluation (Signed)
Anesthesia Evaluation  Patient identified by MRN, date of birth, ID band Patient awake    Reviewed: Allergy & Precautions, H&P , NPO status , Patient's Chart, lab work & pertinent test results  History of Anesthesia Complications (+) PONV  Airway Mallampati: I TM Distance: >3 FB Neck ROM: Full    Dental   Pulmonary asthma ,          Cardiovascular hypertension, Pt. on medications     Neuro/Psych    GI/Hepatic GERD-  Medicated and Controlled,  Endo/Other    Renal/GU      Musculoskeletal  (+) Fibromyalgia -  Abdominal   Peds  Hematology   Anesthesia Other Findings   Reproductive/Obstetrics                           Anesthesia Physical Anesthesia Plan  ASA: II  Anesthesia Plan: General   Post-op Pain Management:    Induction: Intravenous  Airway Management Planned: LMA  Additional Equipment:   Intra-op Plan:   Post-operative Plan: Extubation in OR  Informed Consent: I have reviewed the patients History and Physical, chart, labs and discussed the procedure including the risks, benefits and alternatives for the proposed anesthesia with the patient or authorized representative who has indicated his/her understanding and acceptance.     Plan Discussed with: CRNA and Surgeon  Anesthesia Plan Comments:         Anesthesia Quick Evaluation

## 2013-07-19 NOTE — H&P (View-Only) (Signed)
Patient ID: Melissa Allen, female   DOB: [birth year], 47 y.o.   MRN: [id number]  Chief Complaint  Patient presents with  . Other    Eval left leg mass    HPI Melissa Allen is a 47 y.o. female.  Referred by Dr. Crawford Givens for evaluation of left lower extremity swelling  HPI This is a 47 year old female who presents with a 4 week history of painful swelling in her left lower extremity. She remembers a history of bumping her left shin on the side of the bed during the night about 5 weeks ago. She denies any significant swelling or bruising in this area but a few days later it began swelling and has become extremely tight and painful.Plain films showed no sign of fracture.  Her study was negative for lower extremity DVT. An MRI showed a fluid collection in this area consistent with a hematoma. Attempts at aspiration were unsuccessful.  Past Medical History  Diagnosis Date  . Allergy   . Calcaneal fracture   . Obesity (BMI 30.0-34.9)   . Asthma     recent admit to ER 01/11/12 for exac of asthma  . Hypertension   . Fibromyalgia   . Anginal pain     one episode 4-5 years ago-[place] [facility]-nonspecific-no problems since-  . PONV (postoperative nausea and vomiting)   . Kidney stones   . Duplicated ureter, right     Past Surgical History  Procedure Laterality Date  . Tonsillectomy  age 22  . Bunionectomy  1995    Right  . Cholecystectomy  late 1980's       . Lumbar laminectomy  10/06    L4-5, Ray  . Knee surgery      x 2, Murphy/Sue  . Lithotripsy      Multiple  . R compressed pronator      Sypher  . Tendon repair  2013  . Tendon repair  02/01/12  . Bladder suspension  03/01/2012    Procedure: TRANSVAGINAL TAPE (TVT) PROCEDURE;  Surgeon: Allie Bossier, MD;  Location: [facility];  Service: Gynecology;  Laterality: N/A;  . Cystoscopy  03/01/2012    Procedure: CYSTOSCOPY;  Surgeon: Allie Bossier, MD;  Location: [facility];  Service: Gynecology;  Laterality: N/A;  . Abdominal  hysterectomy      Family History  Problem Relation Age of Onset  . Heart disease Father   . Colon polyps Father   . Colon cancer Paternal Grandfather   . Colon cancer Maternal Grandmother   . Uterine cancer      Grandmother  . Breast cancer      Grandmother  . Ovarian cancer      Grandmother  . Diabetes      mother  . Other Neg Hx   . Cancer Mother     pancreatic cancer    Social History History  Substance Use Topics  . Smoking status: Never Smoker   . Smokeless tobacco: Never Used  . Alcohol Use: 3.5 oz/week    7 drink(s) per week     Comment: Occasional    Allergies  Allergen Reactions  . Codeine     REACTION: vomiting  . Hydrocodone Nausea And Vomiting  . Latex Hives  . Lisinopril     Cough     Current Outpatient Prescriptions  Medication Sig Dispense Refill  . albuterol (PROAIR HFA) 108 (90 BASE) MCG/ACT inhaler Inhale 2 puffs into the lungs every 4 (four) hours as needed. Shortness of breath  1 Inhaler  11  . budesonide-formoterol (SYMBICORT) 160-4.5 MCG/ACT inhaler Inhale 2 puffs into the lungs 2 (two) times daily.  1 Inhaler  0  . cholecalciferol (VITAMIN D) 1000 UNITS tablet Take 1,000 Units by mouth daily.      . DULoxetine (CYMBALTA) 60 MG capsule Take 60 mg by mouth at bedtime.      . furosemide (LASIX) 40 MG tablet Take 1 tablet (40 mg total) by mouth daily.  15 tablet  0  . ipratropium-albuterol (DUONEB) 0.5-2.5 (3) MG/3ML SOLN Take 3 mLs by nebulization every 4 (four) hours as needed.  360 mL  1  . losartan (COZAAR) 50 MG tablet TAKE ONE TABLET BY MOUTH ONE TIME DAILY   30 tablet  5  . montelukast (SINGULAIR) 10 MG tablet Take 1 tablet (10 mg total) by mouth at bedtime.  30 tablet  5  . oxyCODONE (ROXICODONE) 5 MG immediate release tablet Take 0.5 tablets (2.5 mg total) by mouth every 8 (eight) hours as needed for pain.  20 tablet  0  . thiamine (VITAMIN B-1) 100 MG tablet Take 100 mg by mouth daily.      . traZODone (DESYREL) 100 MG tablet Take  one tablet by mouth   nightly at bedtime  30 tablet  10  . triamterene-hydrochlorothiazide (MAXZIDE-25) 37.5-25 MG per tablet Take 1 tablet by mouth daily.      Marland Kitchen doxycycline (VIBRA-TABS) 100 MG tablet Take 1 tablet (100 mg total) by mouth 2 (two) times daily.  20 tablet  0  . fluticasone (FLONASE) 50 MCG/ACT nasal spray Place 2 sprays into the nose daily as needed for allergies.        No current facility-administered medications for this visit.    Review of Systems Review of Systems  Constitutional: Negative for fever, chills and unexpected weight change.  HENT: Negative for congestion, hearing loss, sore throat, trouble swallowing and voice change.   Eyes: Negative for visual disturbance.  Respiratory: Negative for cough and wheezing.   Cardiovascular: Negative for chest pain, palpitations and leg swelling.  Gastrointestinal: Negative for nausea, vomiting, abdominal pain, diarrhea, constipation, blood in stool, abdominal distention and anal bleeding.  Genitourinary: Negative for hematuria, vaginal bleeding and difficulty urinating.  Musculoskeletal: Positive for gait problem and myalgias. Negative for arthralgias.  Skin: Negative for rash and wound.  Neurological: Negative for seizures, syncope and headaches.  Hematological: Negative for adenopathy. Does not bruise/bleed easily.  Psychiatric/Behavioral: Negative for confusion.    Blood pressure 142/82, pulse 86, resp. rate 18, height 5\' 4"  (1.626 m), weight 194 lb (87.998 kg), last menstrual period 12/07/2011.  Physical Exam Physical Exam WDWN in NAD HEENT:  EOMI, sclera anicteric Neck:  No masses, no thyromegaly Lungs:  CTA bilaterally; normal respiratory effort CV:  Regular rate and rhythm; no murmurs Abd:  +bowel sounds, soft, non-tender, no masses Ext:  Well-perfused; no edema;  Anterior LLE shows 5 cm area of swelling with slight erythema;  Very tender to palpation Skin - warm and dry with no sign of jaundice  Data  Reviewed Mr Tibia Fibula Left W Wo Contrast  07/05/2013   CLINICAL DATA:  Injured leg 6 weeks ago. Persistent pain and swelling.  EXAM: MRI OF LOWER LEFT EXTREMITY WITHOUT AND WITH CONTRAST  TECHNIQUE: Multiplanar, multisequence MR imaging of the lower left extremity was performed both before and after administration of intravenous contrast.  CONTRAST:  17mL MULTIHANCE GADOBENATE DIMEGLUMINE 529 MG/ML IV SOLN  COMPARISON:  06/16/2013 radiographs.  FINDINGS: There is  a moderate-sized fluid collection overlying the distal tibial shaft. This measures 5.4 x 3.7 x 1.5 cm. No significant discussed that minimal rim-like enhancement is noted. I think this is most likely a liquified hematoma given history of previous trauma. Minimal surrounding soft tissue inflammation. The underlying tibia is normal. No fracture. The anterior tendons are intact. The ankle joints are unremarkable.  IMPRESSION: 5 cm fluid collection overlying the anterior aspect of the distal tibial shaft is most likely a liquified hematoma.  No acute bony findings.   Electronically Signed   By: Loralie Champagne M.D.   On: 07/05/2013 10:48      Assessment    Hematoma - left anterior left leg    Plan    Incision and drainage of left leg hematoma with possible drain placement.  The surgical procedure has been discussed with the patient.  Potential risks, benefits, alternative treatments, and expected outcomes have been explained.  All of the patient's questions at this time have been answered.  The likelihood of reaching the patient's treatment goal is good.  The patient understand the proposed surgical procedure and wishes to proceed.        Bryona Foxworthy K. 07/18/2013, 3:24 PM

## 2013-07-19 NOTE — Transfer of Care (Signed)
Immediate Anesthesia Transfer of Care Note  Patient: Melissa Allen  Procedure(s) Performed: Procedure(s) with comments: INCISION AND DRAINAGE LEFT LOWER EXTERMITY HEMATOMA (Left) - Excision left lower leg mass  Patient Location: PACU  Anesthesia Type:General  Level of Consciousness: awake, alert  and oriented  Airway & Oxygen Therapy: Patient Spontanous Breathing and Patient connected to face mask oxygen  Post-op Assessment: Report given to PACU RN, Post -op Vital signs reviewed and stable and Patient moving all extremities  Post vital signs: Reviewed and stable  Complications: No apparent anesthesia complications

## 2013-07-19 NOTE — Anesthesia Procedure Notes (Signed)
Procedure Name: Intubation Date/Time: 07/19/2013 1:02 PM Performed by: Zenia Resides D Pre-anesthesia Checklist: Patient identified, Emergency Drugs available, Suction available and Patient being monitored Patient Re-evaluated:Patient Re-evaluated prior to inductionOxygen Delivery Method: Circle System Utilized Preoxygenation: Pre-oxygenation with 100% oxygen Intubation Type: IV induction Ventilation: Mask ventilation without difficulty LMA: LMA inserted LMA Size: 4.0 Tube type: Oral Number of attempts: 1 Airway Equipment and Method: stylet and oral airway Placement Confirmation: ETT inserted through vocal cords under direct vision,  positive ETCO2,  breath sounds checked- equal and bilateral and CO2 detector Tube secured with: Tape Dental Injury: Teeth and Oropharynx as per pre-operative assessment

## 2013-07-19 NOTE — OR Nursing (Signed)
Dr. Corliss Skains placed a latex penrose drain in left lower leg. Aware of latex allergy, but states not a true allergy.

## 2013-07-19 NOTE — Op Note (Signed)
Pre-op Diagnosis: Left lower leg hematoma Post-op Diagnosis:  Left lower leg mass Procedure:  Excision of left lower leg mass - 4 cm - subcutaneous Surgeon:  Wynona Luna. Anesthesia:  GETT Indications:  This is a 47 yo female who presents with a four week history of an enlarging painful mass in her left leg where she had bumped it against her bed.  MRI showed a 5.4 x 3.7 x 1.5 cm fluid collection with normal underlying bone.  She presents now for excision.  Description of procedure: The patient was brought to the operating room and placed in a supine position on the operating room table. After an adequate level of general anesthesia was obtained her left leg was prepped with ChloraPrep and draped in sterile fashion. A timeout was taken to ensure the proper patient proper procedure. We infiltrated the area around the mass with 0.25% Marcaine with epinephrine. We made a longitudinal incision over the mass. Dissection was carried down through the dermis sharply. We encountered a fibrotic, gelatinous appearing mass that corresponded to the hematoma seen on the MRI. We were able to dissect around this mass. This mass was sitting on the underlying fascia but did not invade the fascia. We excised the mass and sent for pathologic examination. We inspected for hemostasis. We irrigated the wound. I placed a quarter inch Penrose drain into the cavity and secured with 2-0 nylon. The wound was then loosely closed with 2-0 nylon. A dry dressing was applied. The patient was then extubated and brought to the recovery room in stable condition. All sponge, initially, and needle counts are correct.  Wilmon Arms. Corliss Skains, MD, [facility] Surgery  General/ Trauma Surgery  07/19/2013 1:38 PM

## 2013-07-19 NOTE — Anesthesia Postprocedure Evaluation (Signed)
  Anesthesia Post-op Note  Patient: Melissa Allen  Procedure(s) Performed: Procedure(s) with comments: INCISION AND DRAINAGE LEFT LOWER EXTERMITY HEMATOMA (Left) - Excision left lower leg mass  Patient Location: PACU  Anesthesia Type:General  Level of Consciousness: awake, alert  and oriented  Airway and Oxygen Therapy: Patient Spontanous Breathing  Post-op Pain: mild  Post-op Assessment: Post-op Vital signs reviewed  Post-op Vital Signs: Reviewed  Complications: No apparent anesthesia complications

## 2013-07-20 ENCOUNTER — Telehealth (INDEPENDENT_AMBULATORY_CARE_PROVIDER_SITE_OTHER): Payer: Self-pay | Admitting: General Surgery

## 2013-07-20 ENCOUNTER — Encounter (HOSPITAL_BASED_OUTPATIENT_CLINIC_OR_DEPARTMENT_OTHER): Payer: Self-pay | Admitting: Surgery

## 2013-07-20 LAB — POCT I-STAT, CHEM 8
BUN: 11 mg/dL (ref 6–23)
Calcium, Ion: 1.15 mmol/L (ref 1.12–1.23)
Chloride: 109 mEq/L (ref 96–112)
HCT: 42 % (ref 36.0–46.0)
Hemoglobin: 14.3 g/dL (ref 12.0–15.0)
Sodium: 138 mEq/L (ref 135–145)
TCO2: 24 mmol/L (ref 0–100)

## 2013-07-20 NOTE — Telephone Encounter (Signed)
Called patient and LMOM on cell phone to let her know to let her call back up here at the office and ask for [facility], I wanted to know that her path is normal that it showed an old hematoma, no sign of cancer or anything unusual, per Dr Corliss Skains.

## 2013-07-20 NOTE — Telephone Encounter (Signed)
Pt returned call and was given the pathology update.

## 2013-07-24 ENCOUNTER — Telehealth (INDEPENDENT_AMBULATORY_CARE_PROVIDER_SITE_OTHER): Payer: Self-pay | Admitting: General Surgery

## 2013-07-24 MED ORDER — AMOXICILLIN-POT CLAVULANATE 875-125 MG PO TABS: 1.0000 | ORAL_TABLET | Freq: Two times a day (BID) | ORAL | Status: AC

## 2013-07-24 NOTE — Telephone Encounter (Signed)
She called saying that her incision was more painful and had some warmth and erythema.  The drainage is clear and not purulent. No fevers or chills. I explained that it sounds like the wound is infected and the treatment would be opening the wound and packing it.  I recommended that she come to the ER for evaluation and treatment of this but she declined for financial reasons.  She expressed understanding that this was the treatment and the recommendation but she declined and will come in to the office tomorrow.  I called in augmentin to her pharmacy and recommended that if the redness increases she should come to ER immediately.

## 2013-07-25 ENCOUNTER — Ambulatory Visit (INDEPENDENT_AMBULATORY_CARE_PROVIDER_SITE_OTHER): Payer: BC Managed Care – PPO | Admitting: Surgery

## 2013-07-25 ENCOUNTER — Encounter (INDEPENDENT_AMBULATORY_CARE_PROVIDER_SITE_OTHER): Payer: Self-pay | Admitting: Surgery

## 2013-07-25 VITALS — BP 130/92 | HR 100 | Temp 98.0°F | Resp 15 | Ht 64.0 in | Wt 194.2 lb

## 2013-07-25 DIAGNOSIS — S8012XA Contusion of left lower leg, initial encounter: Secondary | ICD-10-CM

## 2013-07-25 DIAGNOSIS — S8010XA Contusion of unspecified lower leg, initial encounter: Secondary | ICD-10-CM

## 2013-07-25 MED ORDER — OXYCODONE-ACETAMINOPHEN 5-325 MG PO TABS: 1.0000 | ORAL_TABLET | ORAL | Status: DC | PRN

## 2013-07-25 NOTE — Progress Notes (Signed)
The patient is 6 days status post excision of a old hematoma from her anterior left leg. The wound was closed with 2-0 nylon sutures over a Penrose drain. The patient is having thin serous drainage from the wound.  She has some erythema around the incision. The pain is different than before surgery but is still fairly severe.  Filed Vitals:   07/25/13 1435  BP: 130/92  Pulse: 100  Temp: 98 F (36.7 C)  Resp: 15   Her left low cavity shows the minimal edema. The skin around her incision is erythematous but this seems to be secondary to tightness and swelling rather than an infection. The drainage coming from the incision is thin and serous. After discussing it with the patient, we removed the drain as well as all the sutures. The wound will Open but should relieve the tension. She will treat this with daily dressing changes until healed. I will recheck her later this week. We refilled her pain medication. She is currently on antibiotics and should finish these.  Wilmon Arms. Corliss Skains, MD, [facility] Surgery  General/ Trauma Surgery  07/25/2013 3:55 PM

## 2013-07-25 NOTE — Telephone Encounter (Signed)
The pt called in and reports she spoke to Dr Biagio Quint this weekend.  He called in Amoxicillin for her.  Her leg is red and swollen and the penrose drain has increased drainage.  She wants to know if Dr Corliss Skains wants to see her sooner in the day than her scheduled appointment.

## 2013-07-25 NOTE — Progress Notes (Signed)
The patient is 6 days status post excision of a

## 2013-07-29 ENCOUNTER — Encounter (INDEPENDENT_AMBULATORY_CARE_PROVIDER_SITE_OTHER): Payer: Self-pay | Admitting: Surgery

## 2013-07-29 ENCOUNTER — Ambulatory Visit (INDEPENDENT_AMBULATORY_CARE_PROVIDER_SITE_OTHER): Payer: BC Managed Care – PPO | Admitting: Surgery

## 2013-07-29 VITALS — BP 132/72 | HR 112 | Resp 16 | Ht 64.0 in | Wt 193.4 lb

## 2013-07-29 DIAGNOSIS — S8010XA Contusion of unspecified lower leg, initial encounter: Secondary | ICD-10-CM

## 2013-07-29 DIAGNOSIS — M7989 Other specified soft tissue disorders: Secondary | ICD-10-CM

## 2013-07-29 DIAGNOSIS — S8012XA Contusion of left lower leg, initial encounter: Secondary | ICD-10-CM

## 2013-07-29 NOTE — Progress Notes (Signed)
The patient is here for recheck of her left leg. The wound is open and is draining some thin clear fluid. The erythema is decreasing. She still has significant swelling and tenderness around this area. She should continue keeping the leg elevated as much as possible. Neosporin and a dry dressing over the incision. Followup 2 weeks.  Wilmon Arms. Corliss Skains, MD, [facility] Surgery  General/ Trauma Surgery  07/29/2013 9:22 AM

## 2013-08-08 ENCOUNTER — Encounter (INDEPENDENT_AMBULATORY_CARE_PROVIDER_SITE_OTHER): Payer: Self-pay | Admitting: Surgery

## 2013-08-08 ENCOUNTER — Ambulatory Visit (INDEPENDENT_AMBULATORY_CARE_PROVIDER_SITE_OTHER): Payer: BC Managed Care – PPO | Admitting: Surgery

## 2013-08-08 ENCOUNTER — Encounter (INDEPENDENT_AMBULATORY_CARE_PROVIDER_SITE_OTHER): Payer: BC Managed Care – PPO | Admitting: Surgery

## 2013-08-08 DIAGNOSIS — S8012XA Contusion of left lower leg, initial encounter: Secondary | ICD-10-CM

## 2013-08-08 DIAGNOSIS — S8010XA Contusion of unspecified lower leg, initial encounter: Secondary | ICD-10-CM

## 2013-08-08 NOTE — Progress Notes (Signed)
Recheck of her leg. She still has some residual swelling with muscular tenderness medial to her incision. The edges of the incision remained mildly inflamed but slightly improved. She has some fibrinous exudate within the wound but this seems to be slowly healing. Continue with Neosporin and dry dressings. Followup in one and half weeks.  Wilmon Arms. Corliss Skains, MD, [facility] Surgery  General/ Trauma Surgery  08/08/2013 3:55 PM

## 2013-08-15 ENCOUNTER — Encounter (INDEPENDENT_AMBULATORY_CARE_PROVIDER_SITE_OTHER): Payer: BC Managed Care – PPO | Admitting: Surgery

## 2013-08-19 ENCOUNTER — Encounter (INDEPENDENT_AMBULATORY_CARE_PROVIDER_SITE_OTHER): Payer: Self-pay | Admitting: Surgery

## 2013-08-19 ENCOUNTER — Ambulatory Visit (INDEPENDENT_AMBULATORY_CARE_PROVIDER_SITE_OTHER): Payer: BC Managed Care – PPO | Admitting: Surgery

## 2013-08-19 VITALS — BP 122/80 | HR 77 | Temp 98.8°F | Resp 16 | Ht 64.0 in | Wt 194.2 lb

## 2013-08-19 DIAGNOSIS — S8010XA Contusion of unspecified lower leg, initial encounter: Secondary | ICD-10-CM

## 2013-08-19 DIAGNOSIS — S8012XA Contusion of left lower leg, initial encounter: Secondary | ICD-10-CM

## 2013-08-19 DIAGNOSIS — M7989 Other specified soft tissue disorders: Secondary | ICD-10-CM

## 2013-08-19 NOTE — Progress Notes (Signed)
Her leg incision is much improved.  It is still fairly swollen and tender but there is much less erythema. The drainage has almost completely stopped. She does have some surrounding tenderness but she states that this is improved from last visit. Continue with Neosporin and a dry dressing. She keep her leg elevated as much as possible. Recheck in 3 weeks.  Wilmon Arms. Corliss Skains, MD, [facility] Surgery  General/ Trauma Surgery  08/19/2013 12:11 PM

## 2013-08-26 ENCOUNTER — Other Ambulatory Visit: Payer: Self-pay | Admitting: Family Medicine

## 2013-08-26 NOTE — Telephone Encounter (Signed)
Pt request refill symbicort to [facility]; carrie notified pt done.

## 2013-09-12 ENCOUNTER — Ambulatory Visit (INDEPENDENT_AMBULATORY_CARE_PROVIDER_SITE_OTHER): Payer: BC Managed Care – PPO | Admitting: Internal Medicine

## 2013-09-12 ENCOUNTER — Encounter (INDEPENDENT_AMBULATORY_CARE_PROVIDER_SITE_OTHER): Payer: BC Managed Care – PPO | Admitting: Surgery

## 2013-09-12 ENCOUNTER — Encounter: Payer: Self-pay | Admitting: Internal Medicine

## 2013-09-12 ENCOUNTER — Telehealth: Payer: Self-pay | Admitting: Family Medicine

## 2013-09-12 VITALS — BP 120/88 | HR 110 | Temp 98.7°F | Wt 198.0 lb

## 2013-09-12 DIAGNOSIS — J45901 Unspecified asthma with (acute) exacerbation: Secondary | ICD-10-CM

## 2013-09-12 DIAGNOSIS — J209 Acute bronchitis, unspecified: Secondary | ICD-10-CM

## 2013-09-12 MED ORDER — AZITHROMYCIN 250 MG PO TABS: ORAL_TABLET | ORAL | Status: DC

## 2013-09-12 MED ORDER — BENZONATATE 200 MG PO CAPS: 200.0000 mg | ORAL_CAPSULE | Freq: Three times a day (TID) | ORAL | Status: DC | PRN

## 2013-09-12 MED ORDER — PREDNISONE 20 MG PO TABS: 40.0000 mg | ORAL_TABLET | Freq: Every day | ORAL | Status: DC

## 2013-09-12 NOTE — Progress Notes (Signed)
Subjective:    Patient ID: Melissa Allen, female    DOB: 1965-09-09, 48 y.o.   MRN: [id number]  HPI Feels her asthma is working up Thinks there is bronchitis also Coughing up green sputum---"gobs of it" Chest is tight Needing her inhalers and nebs--helps the tightness but not the cough Some wheezing and rattling in chest  Delsym and other OTC not helping  Started 2 weeks ago or so Constant cough Does feel SOB No fever Some sweats at night No sore throat or ear pain Not congested in head  Current Outpatient Prescriptions on File Prior to Visit  Medication Sig Dispense Refill  . albuterol (PROAIR HFA) 108 (90 BASE) MCG/ACT inhaler Inhale 2 puffs into the lungs every 4 (four) hours as needed. Shortness of breath  1 Inhaler  11  . cholecalciferol (VITAMIN D) 1000 UNITS tablet Take 1,000 Units by mouth daily.      . DULoxetine (CYMBALTA) 60 MG capsule TAKE ONE CAPSULE BY MOUTH NIGHTLY AT BEDTIME   30 capsule  3  . fluticasone (FLONASE) 50 MCG/ACT nasal spray Place 2 sprays into the nose daily as needed for allergies.       . furosemide (LASIX) 40 MG tablet Take 1 tablet (40 mg total) by mouth daily.  15 tablet  0  . ipratropium-albuterol (DUONEB) 0.5-2.5 (3) MG/3ML SOLN Take 3 mLs by nebulization every 4 (four) hours as needed.  360 mL  1  . losartan (COZAAR) 50 MG tablet TAKE ONE TABLET BY MOUTH ONE TIME DAILY   30 tablet  5  . montelukast (SINGULAIR) 10 MG tablet Take 1 tablet (10 mg total) by mouth at bedtime.  30 tablet  5  . oxyCODONE (ROXICODONE) 5 MG immediate release tablet Take 0.5 tablets (2.5 mg total) by mouth every 8 (eight) hours as needed for pain.  20 tablet  0  . oxyCODONE-acetaminophen (PERCOCET/ROXICET) 5-325 MG per tablet Take 1 tablet by mouth every 4 (four) hours as needed for severe pain.  40 tablet  0  . SYMBICORT 160-4.5 MCG/ACT inhaler INHALE TWO PUFFS BY MOUTH TWICE DAILY   10.2 g  6  . thiamine (VITAMIN B-1) 100 MG tablet Take 100 mg by mouth daily.      .  traZODone (DESYREL) 100 MG tablet Take one tablet by mouth   nightly at bedtime  30 tablet  10  . triamterene-hydrochlorothiazide (MAXZIDE-25) 37.5-25 MG per tablet Take 1 tablet by mouth daily.       No current facility-administered medications on file prior to visit.    Allergies  Allergen Reactions  . Codeine     REACTION: vomiting  . Hydrocodone Nausea And Vomiting  . Latex Hives  . Lisinopril     Cough     Past Medical History  Diagnosis Date  . Allergy   . Calcaneal fracture   . Obesity (BMI 30.0-34.9)   . Asthma     recent admit to ER 01/11/12 for exac of asthma  . Hypertension   . Fibromyalgia   . Anginal pain     one episode 4-5 years ago- [facility] [place]-nonspecific-no problems since-  . PONV (postoperative nausea and vomiting)   . Kidney stones   . Duplicated ureter, right   . Poor venous access     hard to start iv-  . Wears glasses     Past Surgical History  Procedure Laterality Date  . Tonsillectomy  age 64  . Bunionectomy  1995    Right  .  Cholecystectomy  late 1980's       . Lumbar laminectomy  10/06    L4-5, Ray  . Knee surgery      x 2, Murphy/Sue  . Lithotripsy      Multiple  . R compressed pronator      Sypher  . Tendon repair  2013  . Tendon repair  02/01/12  . Bladder suspension  03/01/2012    Procedure: TRANSVAGINAL TAPE (TVT) PROCEDURE;  Surgeon: Emily Filbert, MD;  Location: [facility];  Service: Gynecology;  Laterality: N/A;  . Cystoscopy  03/01/2012    Procedure: CYSTOSCOPY;  Surgeon: Emily Filbert, MD;  Location: [facility];  Service: Gynecology;  Laterality: N/A;  . Abdominal hysterectomy    . Incision and drainage abscess Left 07/19/2013    Procedure: INCISION AND DRAINAGE LEFT LOWER EXTERMITY HEMATOMA;  Surgeon: Imogene Burn. Georgette Dover, MD;  Location: [place];  Service: General;  Laterality: Left;  Excision left lower leg mass    Family History  Problem Relation Age of Onset  . Heart disease Father   . Colon polyps  Father   . Colon cancer Paternal Grandfather   . Colon cancer Maternal Grandmother   . Uterine cancer      Grandmother  . Breast cancer      Grandmother  . Ovarian cancer      Grandmother  . Diabetes      mother  . Other Neg Hx   . Cancer Mother     pancreatic cancer    History   Social History  . Marital Status: Married    Spouse Name: N/A    Number of Children: 3  . Years of Education: N/A   Occupational History  . 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 Smoking status: Never Smoker   . Smokeless tobacco: Never Used  . Alcohol Use: 3.5 oz/week    7 drink(s) per week     Comment: Occasional  . Drug Use: No  . Sexual Activity: Yes    Birth Control/ Protection: Surgical   Other Topics Concern  . Not on file   Social History Narrative   Caffeine use- 2 daily   Review of Systems No rash Taking the symbicort regularly No vomiting or diarrhea Gets choked on all the mucus    Objective:   Physical Exam  Constitutional: She appears well-developed and well-nourished.  HENT:  Mouth/Throat: Oropharynx is clear and moist. No oropharyngeal exudate.  No sinus tenderness TMs normal Mild nasal inflammation  Neck: Normal range of motion. Neck supple. No thyromegaly present.  Pulmonary/Chest: Effort normal and breath sounds normal. No respiratory distress. She has no wheezes. She has no rales.  Lymphadenopathy:    She has no cervical adenopathy.          Assessment & Plan:

## 2013-09-12 NOTE — Telephone Encounter (Signed)
Pt left v/m requesting cb. Left message at pts work, she was on another call for pt to cb. Marlynn Perking is trying to reach pt also; scheduled appt for today with Dr Silvio Pate at 3:30 pm.

## 2013-09-12 NOTE — Patient Instructions (Signed)

## 2013-09-12 NOTE — Assessment & Plan Note (Signed)
Not tight but shallow breathing Not wheezing Will give prednisone burst

## 2013-09-12 NOTE — Assessment & Plan Note (Signed)
Sick for 2 weeks Sounds bacterial Hasn't done well with amoxil Will try z-pak and benzonatate

## 2013-09-12 NOTE — Telephone Encounter (Signed)
Will evaluate at OV today

## 2013-09-12 NOTE — Telephone Encounter (Signed)
Patient Information:  Caller Name: Deyra  Phone: [phone]  Patient: Melissa Allen, Melissa Allen  Gender: Female  DOB: [birth year]  Age: 48 Years  PCP: Arnette Norris [facility])  Pregnant: No  Office Follow Up:  Does the office need to follow up with this patient?: No  Instructions For The Office: N/A  RN Note:  Pt was advised to go to ED but she REFUSED. No appts are available at the office today. Pt requested an appt for tomorrow--> scheduled an appt for tomorrow(09/13/13) @ 1045 with Dr Diona Browner. Spoke to scheduler at the office and put pt on waiting list for today to call if someone cancels an appt per caller request. Home care advice given. RN again strongly advised pt to go to ED.  Symptoms  Reason For Call & Symptoms: Pt calling about chest congestion,cough--coughing up green sputum and has a bad metallic taste,chest pain when coughing and taking a deep breath,chest tightness. Has had a cough x 2 weeks. Hx of asthma.  Reviewed Health History In EMR: Yes  Reviewed Medications In EMR: Yes  Reviewed Allergies In EMR: Yes  Reviewed Surgeries / Procedures: Yes  Date of Onset of Symptoms: 09/05/2013  Treatments Tried: inhaler,neb treatments,Delsym  Treatments Tried Worked: No OB / GYN:  LMP: Unknown  Guideline(s) Used:  Cough  Asthma Attack  Disposition Per Guideline:   Go to ED Now (or to Office with PCP Approval)  Reason For Disposition Reached:   Patient sounds very sick or weak to the triager  Advice Given:  Quick-Relief Asthma Medicine:   Start your quick-relief medicine (e.g., albuterol, salbutamol) at the first sign of any coughing or shortness of breath (don't wait for wheezing). Use your inhaler (2 puffs each time) or nebulizer every 4 hours. Continue the quick-relief medicine until you have not wheezed or coughed for 48 hours.  Long-Term-Control Asthma Medicine:  If you are using a controller medicine (e.g., inhaled steroids or cromolyn), continue to take it as directed.  Drinking Liquids:  Try to drink normal amount of liquids (e.g., water). Being adequately hydrated makes it easier to cough up the sticky lung mucus.  Call Back If:  You become worse.  Patient Refused Recommendation:  Patient Will Follow Up With Office Later  pt refused to go to ED or UC and requested an appt for tomorrow since there are no appts available today

## 2013-09-13 ENCOUNTER — Ambulatory Visit: Payer: Self-pay | Admitting: Family Medicine

## 2013-09-18 ENCOUNTER — Emergency Department (HOSPITAL_COMMUNITY): Payer: BC Managed Care – PPO

## 2013-09-18 ENCOUNTER — Encounter (HOSPITAL_COMMUNITY): Payer: Self-pay | Admitting: Emergency Medicine

## 2013-09-18 ENCOUNTER — Inpatient Hospital Stay (HOSPITAL_COMMUNITY)
Admission: EM | Admit: 2013-09-18 | Discharge: 2013-09-20 | DRG: 202 | Disposition: A | Discharge status: Home / Self Care | Payer: BC Managed Care – PPO | Attending: Internal Medicine | Admitting: Internal Medicine

## 2013-09-18 DIAGNOSIS — E669 Obesity, unspecified: Secondary | ICD-10-CM | POA: Diagnosis present

## 2013-09-18 DIAGNOSIS — F3289 Other specified depressive episodes: Secondary | ICD-10-CM

## 2013-09-18 DIAGNOSIS — K219 Gastro-esophageal reflux disease without esophagitis: Secondary | ICD-10-CM

## 2013-09-18 DIAGNOSIS — IMO0001 Reserved for inherently not codable concepts without codable children: Secondary | ICD-10-CM

## 2013-09-18 DIAGNOSIS — K589 Irritable bowel syndrome without diarrhea: Secondary | ICD-10-CM | POA: Diagnosis present

## 2013-09-18 DIAGNOSIS — J4521 Mild intermittent asthma with (acute) exacerbation: Secondary | ICD-10-CM

## 2013-09-18 DIAGNOSIS — Z87442 Personal history of urinary calculi: Secondary | ICD-10-CM | POA: Diagnosis present

## 2013-09-18 DIAGNOSIS — M7989 Other specified soft tissue disorders: Secondary | ICD-10-CM

## 2013-09-18 DIAGNOSIS — E876 Hypokalemia: Secondary | ICD-10-CM | POA: Diagnosis present

## 2013-09-18 DIAGNOSIS — M797 Fibromyalgia: Secondary | ICD-10-CM | POA: Diagnosis present

## 2013-09-18 DIAGNOSIS — I517 Cardiomegaly: Secondary | ICD-10-CM | POA: Diagnosis present

## 2013-09-18 DIAGNOSIS — R350 Frequency of micturition: Secondary | ICD-10-CM

## 2013-09-18 DIAGNOSIS — F329 Major depressive disorder, single episode, unspecified: Secondary | ICD-10-CM

## 2013-09-18 DIAGNOSIS — K861 Other chronic pancreatitis: Secondary | ICD-10-CM

## 2013-09-18 DIAGNOSIS — S8012XA Contusion of left lower leg, initial encounter: Secondary | ICD-10-CM

## 2013-09-18 DIAGNOSIS — Z833 Family history of diabetes mellitus: Secondary | ICD-10-CM

## 2013-09-18 DIAGNOSIS — Z23 Encounter for immunization: Secondary | ICD-10-CM

## 2013-09-18 DIAGNOSIS — R609 Edema, unspecified: Secondary | ICD-10-CM

## 2013-09-18 DIAGNOSIS — J309 Allergic rhinitis, unspecified: Secondary | ICD-10-CM

## 2013-09-18 DIAGNOSIS — Z6834 Body mass index (BMI) 34.0-34.9, adult: Secondary | ICD-10-CM

## 2013-09-18 DIAGNOSIS — N200 Calculus of kidney: Secondary | ICD-10-CM

## 2013-09-18 DIAGNOSIS — R0789 Other chest pain: Secondary | ICD-10-CM

## 2013-09-18 DIAGNOSIS — L255 Unspecified contact dermatitis due to plants, except food: Secondary | ICD-10-CM

## 2013-09-18 DIAGNOSIS — Z8249 Family history of ischemic heart disease and other diseases of the circulatory system: Secondary | ICD-10-CM

## 2013-09-18 DIAGNOSIS — M129 Arthropathy, unspecified: Secondary | ICD-10-CM

## 2013-09-18 DIAGNOSIS — Z79899 Other long term (current) drug therapy: Secondary | ICD-10-CM

## 2013-09-18 DIAGNOSIS — F411 Generalized anxiety disorder: Secondary | ICD-10-CM

## 2013-09-18 DIAGNOSIS — M25561 Pain in right knee: Secondary | ICD-10-CM

## 2013-09-18 DIAGNOSIS — Z885 Allergy status to narcotic agent status: Secondary | ICD-10-CM

## 2013-09-18 DIAGNOSIS — K7689 Other specified diseases of liver: Secondary | ICD-10-CM | POA: Diagnosis present

## 2013-09-18 DIAGNOSIS — R51 Headache: Secondary | ICD-10-CM

## 2013-09-18 DIAGNOSIS — I10 Essential (primary) hypertension: Secondary | ICD-10-CM | POA: Diagnosis present

## 2013-09-18 DIAGNOSIS — R002 Palpitations: Secondary | ICD-10-CM

## 2013-09-18 DIAGNOSIS — M79605 Pain in left leg: Secondary | ICD-10-CM

## 2013-09-18 DIAGNOSIS — Z91040 Latex allergy status: Secondary | ICD-10-CM

## 2013-09-18 DIAGNOSIS — R42 Dizziness and giddiness: Secondary | ICD-10-CM

## 2013-09-18 DIAGNOSIS — J45909 Unspecified asthma, uncomplicated: Secondary | ICD-10-CM | POA: Diagnosis present

## 2013-09-18 DIAGNOSIS — K2960 Other gastritis without bleeding: Secondary | ICD-10-CM | POA: Diagnosis present

## 2013-09-18 DIAGNOSIS — R079 Chest pain, unspecified: Secondary | ICD-10-CM

## 2013-09-18 DIAGNOSIS — J209 Acute bronchitis, unspecified: Secondary | ICD-10-CM

## 2013-09-18 DIAGNOSIS — M25562 Pain in left knee: Secondary | ICD-10-CM

## 2013-09-18 DIAGNOSIS — J45901 Unspecified asthma with (acute) exacerbation: Secondary | ICD-10-CM

## 2013-09-18 LAB — PREGNANCY, URINE: Preg Test, Ur: NEGATIVE

## 2013-09-18 LAB — CBC
HCT: 44.5 % (ref 36.0–46.0)
HEMOGLOBIN: 15.4 g/dL — AB (ref 12.0–15.0)
MCH: 31.8 pg (ref 26.0–34.0)
MCHC: 34.6 g/dL (ref 30.0–36.0)
MCV: 91.8 fL (ref 78.0–100.0)
Platelets: 251 10*3/uL (ref 150–400)
RBC: 4.85 MIL/uL (ref 3.87–5.11)
RDW: 13.4 % (ref 11.5–15.5)
WBC: 8.9 10*3/uL (ref 4.0–10.5)

## 2013-09-18 LAB — URINALYSIS, ROUTINE W REFLEX MICROSCOPIC
Bilirubin Urine: NEGATIVE
GLUCOSE, UA: NEGATIVE mg/dL
Hgb urine dipstick: NEGATIVE
KETONES UR: NEGATIVE mg/dL
Nitrite: NEGATIVE
PH: 7.5 (ref 5.0–8.0)
Protein, ur: NEGATIVE mg/dL
Specific Gravity, Urine: 1.015 (ref 1.005–1.030)
Urobilinogen, UA: 0.2 mg/dL (ref 0.0–1.0)

## 2013-09-18 LAB — URINE MICROSCOPIC-ADD ON

## 2013-09-18 LAB — BASIC METABOLIC PANEL
BUN: 14 mg/dL (ref 6–23)
CALCIUM: 9.3 mg/dL (ref 8.4–10.5)
CHLORIDE: 94 meq/L — AB (ref 96–112)
CO2: 27 mEq/L (ref 19–32)
Creatinine, Ser: 0.67 mg/dL (ref 0.50–1.10)
GFR calc Af Amer: >90 mL/min (ref >90)
GFR calc non Af Amer: >90 mL/min (ref >90)
GLUCOSE: 94 mg/dL (ref 70–99)
Potassium: 3.2 mEq/L — ABNORMAL LOW (ref 3.7–5.3)
SODIUM: 134 meq/L — AB (ref 137–147)

## 2013-09-18 LAB — GLUCOSE, CAPILLARY: Glucose-Capillary: 85 mg/dL (ref 70–99)

## 2013-09-18 LAB — POCT I-STAT TROPONIN I: TROPONIN I, POC: 0.01 ng/mL (ref 0.00–0.08)

## 2013-09-18 LAB — TROPONIN I

## 2013-09-18 MED ORDER — ONDANSETRON HCL 4 MG/2ML IJ SOLN
4.0000 mg | Freq: Once | INTRAMUSCULAR | Status: AC
  Administered 2013-09-18: 4 mg via INTRAVENOUS
  Filled 2013-09-18: qty 2

## 2013-09-18 MED ORDER — MORPHINE SULFATE 4 MG/ML IJ SOLN
4.0000 mg | Freq: Once | INTRAMUSCULAR | Status: AC
  Administered 2013-09-18: 4 mg via INTRAVENOUS
  Filled 2013-09-18: qty 1

## 2013-09-18 MED ORDER — NITROGLYCERIN 0.4 MG/SPRAY TL SOLN
1.0000 | Freq: Once | Status: DC
  Filled 2013-09-18: qty 4.9

## 2013-09-18 MED ORDER — BUDESONIDE-FORMOTEROL FUMARATE 160-4.5 MCG/ACT IN AERO
2.0000 | INHALATION_SPRAY | Freq: Two times a day (BID) | RESPIRATORY_TRACT | Status: DC
  Filled 2013-09-18: qty 6

## 2013-09-18 MED ORDER — TRIAMTERENE-HCTZ 37.5-25 MG PO TABS
1.0000 | ORAL_TABLET | Freq: Every day | ORAL | Status: DC
  Administered 2013-09-19 – 2013-09-20 (×2): 1 via ORAL
  Filled 2013-09-18 (×2): qty 1

## 2013-09-18 MED ORDER — OXYCODONE HCL 5 MG PO TABS
2.5000 mg | ORAL_TABLET | Freq: Three times a day (TID) | ORAL | Status: DC | PRN
  Administered 2013-09-18 – 2013-09-19 (×4): 2.5 mg via ORAL
  Administered 2013-09-20: 12:00:00 via ORAL
  Filled 2013-09-18 (×6): qty 1

## 2013-09-18 MED ORDER — LOSARTAN POTASSIUM 50 MG PO TABS
50.0000 mg | ORAL_TABLET | Freq: Every day | ORAL | Status: DC
  Administered 2013-09-19 – 2013-09-20 (×2): 50 mg via ORAL
  Filled 2013-09-18 (×2): qty 1

## 2013-09-18 MED ORDER — NITROGLYCERIN 0.4 MG SL SUBL
0.4000 mg | SUBLINGUAL_TABLET | SUBLINGUAL | Status: DC | PRN
  Administered 2013-09-18 (×2): 0.4 mg via SUBLINGUAL

## 2013-09-18 MED ORDER — IPRATROPIUM-ALBUTEROL 0.5-2.5 (3) MG/3ML IN SOLN
3.0000 mL | RESPIRATORY_TRACT | Status: DC
  Administered 2013-09-18 – 2013-09-19 (×2): 3 mL via RESPIRATORY_TRACT
  Filled 2013-09-18 (×2): qty 3

## 2013-09-18 MED ORDER — BIOTENE DRY MOUTH MT LIQD
15.0000 mL | Freq: Three times a day (TID) | OROMUCOSAL | Status: DC
  Administered 2013-09-18 – 2013-09-20 (×7): 15 mL via OROMUCOSAL

## 2013-09-18 MED ORDER — METHYLPREDNISOLONE SODIUM SUCC 125 MG IJ SOLR
60.0000 mg | Freq: Four times a day (QID) | INTRAMUSCULAR | Status: DC
  Administered 2013-09-18 – 2013-09-19 (×4): 60 mg via INTRAVENOUS
  Filled 2013-09-18 (×7): qty 0.96

## 2013-09-18 MED ORDER — KETOROLAC TROMETHAMINE 15 MG/ML IJ SOLN
15.0000 mg | Freq: Once | INTRAMUSCULAR | Status: AC
  Administered 2013-09-18: 15 mg via INTRAVENOUS
  Filled 2013-09-18: qty 1

## 2013-09-18 MED ORDER — FLUTICASONE PROPIONATE 50 MCG/ACT NA SUSP
2.0000 | Freq: Every day | NASAL | Status: DC | PRN
  Filled 2013-09-18: qty 16

## 2013-09-18 MED ORDER — MONTELUKAST SODIUM 10 MG PO TABS
10.0000 mg | ORAL_TABLET | Freq: Every day | ORAL | Status: DC
  Administered 2013-09-18 – 2013-09-19 (×2): 10 mg via ORAL
  Filled 2013-09-18 (×3): qty 1

## 2013-09-18 MED ORDER — DULOXETINE HCL 60 MG PO CPEP
60.0000 mg | ORAL_CAPSULE | Freq: Every day | ORAL | Status: DC
  Administered 2013-09-18 – 2013-09-19 (×2): 60 mg via ORAL
  Filled 2013-09-18 (×3): qty 1

## 2013-09-18 MED ORDER — TRAZODONE HCL 100 MG PO TABS
100.0000 mg | ORAL_TABLET | Freq: Every day | ORAL | Status: DC
  Administered 2013-09-18 – 2013-09-19 (×2): 100 mg via ORAL
  Filled 2013-09-18 (×3): qty 1

## 2013-09-18 MED ORDER — MORPHINE SULFATE 2 MG/ML IJ SOLN
2.0000 mg | Freq: Once | INTRAMUSCULAR | Status: AC
  Administered 2013-09-18: 2 mg via INTRAVENOUS
  Filled 2013-09-18: qty 1

## 2013-09-18 MED ORDER — IPRATROPIUM-ALBUTEROL 0.5-2.5 (3) MG/3ML IN SOLN: 3.0000 mL | RESPIRATORY_TRACT | Status: DC

## 2013-09-18 MED ORDER — HEPARIN SODIUM (PORCINE) 5000 UNIT/ML IJ SOLN
5000.0000 [IU] | Freq: Three times a day (TID) | INTRAMUSCULAR | Status: DC
Start: 2013-09-18 — End: 2013-09-20
  Administered 2013-09-18 – 2013-09-20 (×7): 5000 [IU] via SUBCUTANEOUS
  Filled 2013-09-18 (×9): qty 1

## 2013-09-18 MED ORDER — ASPIRIN 81 MG PO CHEW
324.0000 mg | CHEWABLE_TABLET | Freq: Once | ORAL | Status: AC
  Administered 2013-09-18: 324 mg via ORAL
  Filled 2013-09-18: qty 4

## 2013-09-18 MED ORDER — NITROGLYCERIN 0.4 MG/SPRAY TL SOLN: 1.0000 | Status: DC | PRN

## 2013-09-18 MED ORDER — ALBUTEROL SULFATE (2.5 MG/3ML) 0.083% IN NEBU
2.5000 mg | INHALATION_SOLUTION | RESPIRATORY_TRACT | Status: DC
  Administered 2013-09-18: 2.5 mg via RESPIRATORY_TRACT
  Filled 2013-09-18: qty 3

## 2013-09-18 MED ORDER — DIPHENHYDRAMINE HCL 25 MG PO TABS
25.0000 mg | ORAL_TABLET | Freq: Four times a day (QID) | ORAL | Status: DC | PRN
  Administered 2013-09-19: 25 mg via ORAL
  Filled 2013-09-18 (×2): qty 1

## 2013-09-18 MED ORDER — PNEUMOCOCCAL VAC POLYVALENT 25 MCG/0.5ML IJ INJ
0.5000 mL | INJECTION | INTRAMUSCULAR | Status: DC
  Filled 2013-09-18: qty 0.5

## 2013-09-18 MED ORDER — BUDESONIDE-FORMOTEROL FUMARATE 160-4.5 MCG/ACT IN AERO
2.0000 | INHALATION_SPRAY | Freq: Two times a day (BID) | RESPIRATORY_TRACT | Status: DC
  Administered 2013-09-19 – 2013-09-20 (×3): 2 via RESPIRATORY_TRACT
  Filled 2013-09-18: qty 6

## 2013-09-18 MED ORDER — MORPHINE SULFATE 4 MG/ML IJ SOLN: 4.0000 mg | Freq: Once | INTRAMUSCULAR | Status: DC

## 2013-09-18 NOTE — H&P (Signed)
[facility] History and Physical  Melissa Allen [id number] DOB: 01-Aug-1966 DOA: 09/18/2013  Referring physician: ED PCP: Arnette Norris, MD  Specialists: none currently  Chief Complaint: chest pressure  HPI: Melissa Allen is a 48 y.o. female came to [facility] ed 09/18/2013 with chest pressure-patient carries a significant diagnosis of asthma since age of 54, but was seen last at Dr. Durel Salts 09/12/13-thought that she had potentialbronchitis. She had been treated with amoxicillin but then when seen in the office this was transitioned toZ-Pak as well as Best boy. She was given a prednisone burst of 40 twice a day which she obtained on1/5/15 and she was posterior transition to one tablet orally at came to the emergency room because she started having chest pain last night around 11 PM. She states that it was 10 out 10 pain not worsened by any activity or moving was a squeezing pressing type of pain going through and through to her back and felt more like a pressure. She states that she was unable to take a deep breath and every time she does it causes pain She states that the pain was 10 on 10 initially but then became 8/10 after being given morphine and Toradol in the emergency room. She has a distant history of being seen 01/31/2012by Dr. Ida Rogue of cardiology and it was thought that she had noncardiogenic chest pain as she has a long history of GI erosions and pancreatitis many years ago For this she was evaluated by Dr. Maurene Capes who thought that she had irritable bowel syndrome by history with diarrhea alternating with constipation in addition to moderate severe reflux. I cannot find the report of the endoscopy  Her workup in the emergency room revealed EKG showing sinus rhythm PR interval 0.12 QRS axis 90, no ST-T wave L.   POCtroponin negative Pregnancy test negative Hypokalemia 3.2 Chest x-ray =cardiomegaly with vascular congestion, no prior to compare with   Review of Systems: The  patient denies  Current fever chills sputum sick contacts diarrhea dysuria dark vomiting, dark stool, falls, dizziness, rash  Past Medical History  Diagnosis Date  . Allergy   . Calcaneal fracture   . Obesity (BMI 30.0-34.9)   . Asthma     recent admit to ER 01/11/12 for exac of asthma  . Hypertension   . Fibromyalgia   . Anginal pain     one episode 4-5 years ago-[facility]-nonspecific-no problems since-  . PONV (postoperative nausea and vomiting)   . Kidney stones   . Duplicated ureter, right   . Poor venous access     hard to start iv-  . Wears glasses    Past Surgical History  Procedure Laterality Date  . Tonsillectomy  age 58  . Bunionectomy  1995    Right  . Cholecystectomy  late 1980's       . Lumbar laminectomy  10/06    L4-5, Ray  . Knee surgery      x 2, Murphy/Sue  . Lithotripsy      Multiple  . R compressed pronator      Sypher  . Tendon repair  2013  . Tendon repair  02/01/12  . Bladder suspension  03/01/2012    Procedure: TRANSVAGINAL TAPE (TVT) PROCEDURE;  Surgeon: Emily Filbert, MD;  Location: [facility];  Service: Gynecology;  Laterality: N/A;  . Cystoscopy  03/01/2012    Procedure: CYSTOSCOPY;  Surgeon: Emily Filbert, MD;  Location: [facility];  Service: Gynecology;  Laterality:  N/A;  . Abdominal hysterectomy    . Incision and drainage abscess Left 07/19/2013    Procedure: INCISION AND DRAINAGE LEFT LOWER EXTERMITY HEMATOMA;  Surgeon: Imogene Burn. 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 Codeine     REACTION: vomiting  . Hydrocodone Nausea And Vomiting  . Latex Hives  . Lisinopril     Cough     Family History   Problem Relation Age of Onset  . Heart disease Father   . Colon polyps Father   . Colon cancer Paternal Grandfather   . Colon cancer Maternal Grandmother   . Uterine cancer      Grandmother  . Breast cancer      Grandmother  . Ovarian cancer      Grandmother  . Diabetes      mother  . Other Neg Hx   . Cancer Mother     pancreatic cancer     Prior to Admission medications   Medication Sig Start Date End Date Taking? Authorizing Provider  albuterol (PROAIR HFA) 108 (90 BASE) MCG/ACT inhaler Inhale 2 puffs into the lungs every 4 (four) hours as needed. Shortness of breath 11/01/12  Yes Ria Bush, MD  benzonatate (TESSALON) 200 MG capsule Take 1 capsule (200 mg total) by mouth 3 (three) times daily as needed for cough. 09/12/13  Yes Venia Carbon, MD  cholecalciferol (VITAMIN D) 1000 UNITS tablet Take 1,000 Units by mouth daily.   Yes Historical Provider, MD  diphenhydrAMINE (BENADRYL) 25 MG tablet Take 25 mg by mouth every 6 (six) hours as needed.   Yes Historical Provider, MD  DULoxetine (CYMBALTA) 60 MG capsule TAKE ONE CAPSULE BY MOUTH NIGHTLY AT BEDTIME  08/26/13  Yes Webb Silversmith, NP  fluticasone (FLONASE) 50 MCG/ACT nasal spray Place 2 sprays into the nose daily as needed for allergies.    Yes Historical Provider, MD  furosemide (LASIX) 40 MG tablet Take 40 mg by mouth daily as needed for edema.   Yes Historical Provider, MD  ipratropium-albuterol (DUONEB) 0.5-2.5 (3) MG/3ML SOLN Take 3 mLs by nebulization every 4 (four) hours as needed. 01/04/13  Yes Lucille Passy, MD  losartan (COZAAR) 50 MG tablet TAKE ONE TABLET BY MOUTH ONE TIME DAILY  07/12/13  Yes Lucille Passy, MD  montelukast (SINGULAIR) 10 MG tablet Take 1 tablet (10 mg total) by mouth at bedtime. 02/10/11  Yes Tonia Ghent, MD  oxyCODONE (ROXICODONE) 5 MG immediate release tablet Take 0.5 tablets (2.5 mg total) by mouth every 8 (eight) hours as needed for pain. 07/06/13  Yes Tonia Ghent, MD   oxyCODONE-acetaminophen (PERCOCET/ROXICET) 5-325 MG per tablet Take 1 tablet by mouth every 4 (four) hours as needed for severe pain. 07/25/13  Yes Imogene Burn. Tsuei, MD  predniSONE (DELTASONE) 20 MG tablet Take 2 tablets (40 mg total) by mouth daily. 09/12/13  Yes Venia Carbon, MD  SYMBICORT 160-4.5 MCG/ACT inhaler INHALE TWO PUFFS BY MOUTH TWICE DAILY  08/26/13  Yes Lucille Passy, MD  thiamine (VITAMIN B-1) 100 MG tablet Take 100 mg by mouth daily.   Yes  Historical Provider, MD  traZODone (DESYREL) 100 MG tablet Take one tablet by mouth   nightly at bedtime 06/22/13  Yes Lucille Passy, MD  triamterene-hydrochlorothiazide (MAXZIDE-25) 37.5-25 MG per tablet Take 1 tablet by mouth daily.   Yes Historical Provider, MD  azithromycin (ZITHROMAX) 250 MG tablet 2 tabs today, then 1 tab daily for 4 days 09/12/13   Venia Carbon, MD   Physical Exam: Filed Vitals:   09/18/13 1145 09/18/13 1315 09/18/13 1441 09/18/13 1457  BP: 110/59 121/74 111/51 111/56  Pulse: 90 86 99 104  Temp:      TempSrc:      Resp: 14 9 24 14   Height:      Weight:      SpO2: 95% 95% 92% 98%     General:  EOMI, NCAT, no murmur rub or gallop  Eyes: see above  ENT: thick neck Mallampati 5  Neck: no JVD no bruit  Cardiovascular: S1-S2 no murmur rub or gallop no added sound  Respiratory: decreased air entry no Rales rhonchi no crackles  Abdomen: soft nontender obese  Skin: mild lower extremity edema trace  Musculoskeletal: range of motion intact  Psychiatric: euthymic  Neurologic: gross intact with all 4 limbs equally  Labs on Admission:  Basic Metabolic Panel:  Recent Labs Lab 09/18/13 1233  NA 134*  K 3.2*  CL 94*  CO2 27  GLUCOSE 94  BUN 14  CREATININE 0.67  CALCIUM 9.3   Liver Function Tests: No results found for this basename: AST, ALT, ALKPHOS, BILITOT, PROT, ALBUMIN,  in the last 168 hours No results found for this basename: LIPASE, AMYLASE,  in the last 168 hours No results found for  this basename: AMMONIA,  in the last 168 hours CBC:  Recent Labs Lab 09/18/13 1233  WBC 8.9  HGB 15.4*  HCT 44.5  MCV 91.8  PLT 251   Cardiac Enzymes: No results found for this basename: CKTOTAL, CKMB, CKMBINDEX, TROPONINI,  in the last 168 hours  BNP (last 3 results) No results found for this basename: PROBNP,  in the last 8760 hours CBG: No results found for this basename: GLUCAP,  in the last 168 hours  Radiological Exams on Admission: Dg Chest 2 View  09/18/2013   CLINICAL DATA:  Chest pain  EXAM: CHEST  2 VIEW  COMPARISON:  01/11/2012  FINDINGS: Cardiomegaly with mild vascular congestion. Minimal basilar atelectasis. Right hemidiaphragm is mildly elevated. No CHF or pneumonia. Negative for effusion or pneumothorax. Trachea midline. Minor degenerative changes of the spine. Prior cholecystectomy.  IMPRESSION: Cardiomegaly with vascular congestion  Low lung volumes with basilar atelectasis   Electronically Signed   By: Daryll Brod M.D.   On: 09/18/2013 11:05    EKG: Independently reviewed. See above  Assessment/Plan Principal Problem:   Chest pain with low risk of acute coronary syndrome-heart score = 1-2.  we will rule out Chest pain pathway. In addition get echocardiogram given suggestion of pulmonary edema.  Chest pain has not been relieved by Nitroglycerin really nor morphine and I think there is a functional component to this, nevertheless we should rule out objectively the reason for her pain-consult cardiology if needed in the morning or if pain is not improved. Active Problems:     Asthma with acute exacerbation-nebulized every 4 scheduled,   ASTHMA-moderate persistent by history-peak flow every shift, inhaled albuterol schedule every 4 hours he, her symptoms sound somewhat more respiratory in terms of the pain and pressure in the chest being  worse with deep reading and difficulty taking a deep breath. Continue IV steroids Solu-Medrol as has been given in the emergency  room here it she states slight intolerance to prednisone as it causes some insomnia.   REFLUX GASTRITIS-continue PPI   HEPATIC CYST-stable-   PANCREATITIS,CHRONICn stable, possibly a functional component of thisas she also has IBS   FIBROMYALGIA-continue Cymbalta each bedtime, trazodone   NEPHROLITHIASIS, HX OF   Traumatic hematoma of left lower leg   Time spent: [place], [facility] Pager [phone]  If 7PM-7AM, please contact night-coverage [email or web address] Password [email or web address] 09/18/2013, 3:19 PM

## 2013-09-18 NOTE — ED Notes (Signed)
Dr Samtani at bedside 

## 2013-09-18 NOTE — ED Provider Notes (Signed)
CSN: [id number]     Arrival date & time 09/18/13  1030 History   First MD Initiated Contact with Patient 09/18/13 1138     Chief Complaint  Patient presents with  . Chest Pain   (Consider location/radiation/quality/duration/timing/severity/associated sxs/prior Treatment) HPI Comments: Pt had CP last night which started at approx 2300 and it lasted for 15-20 minutes. This AM pain began 1015 while at church.    Pt has h/o asthma and bronchitis.  Pain today is severe and different than asthma and bronchitis symptoms.    Pt was seen last week for chest congestion and asthma.  These symptoms have improved. Pt received azithromycin rx.    Pt has h/o HTN, COPD, asthma, allergies, Fibromyalgia, poor venous access, and multiple surgeries.    Patient is a 48 y.o. female presenting with chest pain. The history is provided by the patient and the spouse.  Chest Pain Pain location:  Substernal area Pain quality: aching   Pain radiates to the back: no   Pain severity:  Severe Onset quality:  Gradual (started at 1015) Duration:  2 hours Timing:  Constant Progression:  Improving Chronicity:  Recurrent Relieved by:  None tried Worsened by:  Nothing tried Ineffective treatments:  None tried Associated symptoms: cough and nausea   Associated symptoms: no abdominal pain, no palpitations, no shortness of breath and not vomiting   Risk factors: hypertension and obesity   Risk factors: no aortic disease, no birth control, no coronary artery disease, no diabetes mellitus, no high cholesterol, not pregnant, no prior DVT/PE, no smoking and no surgery     Past Medical History  Diagnosis Date  . Allergy   . Calcaneal fracture   . Obesity (BMI 30.0-34.9)   . Asthma     recent admit to ER 01/11/12 for exac of asthma  . Hypertension   . Fibromyalgia   . Anginal pain     one episode 4-5 years ago-[facility]-nonspecific-no problems since-  . PONV (postoperative nausea and vomiting)   .  Kidney stones   . Duplicated ureter, right   . Poor venous access     hard to start iv-  . Wears glasses    Past Surgical History  Procedure Laterality Date  . Tonsillectomy  age 27  . Bunionectomy  1995    Right  . Cholecystectomy  late 1980's       . Lumbar laminectomy  10/06    L4-5, Ray  . Knee surgery      x 2, Murphy/Sue  . Lithotripsy      Multiple  . R compressed pronator      Sypher  . Tendon repair  2013  . Tendon repair  02/01/12  . Bladder suspension  03/01/2012    Procedure: TRANSVAGINAL TAPE (TVT) PROCEDURE;  Surgeon: Emily Filbert, MD;  Location: [facility];  Service: Gynecology;  Laterality: N/A;  . Cystoscopy  03/01/2012    Procedure: CYSTOSCOPY;  Surgeon: Emily Filbert, MD;  Location: [facility];  Service: Gynecology;  Laterality: N/A;  . Abdominal hysterectomy    . Incision and drainage abscess Left 07/19/2013    Procedure: INCISION AND DRAINAGE LEFT LOWER EXTERMITY HEMATOMA;  Surgeon: Imogene Burn. Georgette Dover, MD;  Location: [facility];  Service: General;  Laterality: Left;  Excision left lower leg mass   Family History  Problem Relation Age of Onset  . Heart disease Father   . Colon polyps Father   . Colon cancer Paternal Grandfather   .  Colon cancer Maternal Grandmother   . Uterine cancer      Grandmother  . Breast cancer      Grandmother  . Ovarian cancer      Grandmother  . Diabetes      mother  . Other Neg Hx   . Cancer Mother     pancreatic cancer   History  Substance Use Topics  . Smoking status: Never Smoker   . Smokeless tobacco: Never Used  . Alcohol Use: 3.5 oz/week    7 drink(s) per week     Comment: Occasional   OB History   Grav Para Term Preterm Abortions TAB SAB Ect Mult Living   3 3 2 1  0 0 0 0 0 3     Review of Systems  Constitutional: Positive for activity change.       Increased CP on exertion.    HENT: Negative.   Eyes: Negative.   Respiratory: Positive for cough. Negative for apnea, choking, chest tightness,  shortness of breath, wheezing and stridor.   Cardiovascular: Positive for chest pain. Negative for palpitations and leg swelling.  Gastrointestinal: Positive for nausea. Negative for vomiting, abdominal pain, diarrhea, constipation, blood in stool, abdominal distention, anal bleeding and rectal pain.  Endocrine: Negative.   Genitourinary: Negative.   Musculoskeletal: Negative.   Skin: Negative.   Allergic/Immunologic: Negative.   Neurological: Negative.   Hematological: Negative.   Psychiatric/Behavioral: Negative.   All other systems reviewed and are negative.    Allergies  Codeine; Hydrocodone; Latex; and Lisinopril  Home Medications   Current Outpatient Rx  Name  Route  Sig  Dispense  Refill  . albuterol (PROAIR HFA) 108 (90 BASE) MCG/ACT inhaler   Inhalation   Inhale 2 puffs into the lungs every 4 (four) hours as needed. Shortness of breath   1 Inhaler   11   . benzonatate (TESSALON) 200 MG capsule   Oral   Take 1 capsule (200 mg total) by mouth 3 (three) times daily as needed for cough.   30 capsule   0   . cholecalciferol (VITAMIN D) 1000 UNITS tablet   Oral   Take 1,000 Units by mouth daily.         . diphenhydrAMINE (BENADRYL) 25 MG tablet   Oral   Take 25 mg by mouth every 6 (six) hours as needed.         . DULoxetine (CYMBALTA) 60 MG capsule      TAKE ONE CAPSULE BY MOUTH NIGHTLY AT BEDTIME    30 capsule   3   . fluticasone (FLONASE) 50 MCG/ACT nasal spray   Nasal   Place 2 sprays into the nose daily as needed for allergies.          . furosemide (LASIX) 40 MG tablet   Oral   Take 40 mg by mouth daily as needed for edema.         Marland Kitchen ipratropium-albuterol (DUONEB) 0.5-2.5 (3) MG/3ML SOLN   Nebulization   Take 3 mLs by nebulization every 4 (four) hours as needed.   360 mL   1   . losartan (COZAAR) 50 MG tablet      TAKE ONE TABLET BY MOUTH ONE TIME DAILY    30 tablet   5   . montelukast (SINGULAIR) 10 MG tablet   Oral   Take 1  tablet (10 mg total) by mouth at bedtime.   30 tablet   5   . oxyCODONE (ROXICODONE) 5 MG immediate  release tablet   Oral   Take 0.5 tablets (2.5 mg total) by mouth every 8 (eight) hours as needed for pain.   20 tablet   0   . oxyCODONE-acetaminophen (PERCOCET/ROXICET) 5-325 MG per tablet   Oral   Take 1 tablet by mouth every 4 (four) hours as needed for severe pain.   40 tablet   0   . predniSONE (DELTASONE) 20 MG tablet   Oral   Take 2 tablets (40 mg total) by mouth daily.   15 tablet   0     For 5 days, then 1 tab daily for 5 days   . SYMBICORT 160-4.5 MCG/ACT inhaler      INHALE TWO PUFFS BY MOUTH TWICE DAILY    10.2 g   6   . thiamine (VITAMIN B-1) 100 MG tablet   Oral   Take 100 mg by mouth daily.         . traZODone (DESYREL) 100 MG tablet      Take one tablet by mouth   nightly at bedtime   30 tablet   10   . triamterene-hydrochlorothiazide (MAXZIDE-25) 37.5-25 MG per tablet   Oral   Take 1 tablet by mouth daily.         Marland Kitchen azithromycin (ZITHROMAX) 250 MG tablet      2 tabs today, then 1 tab daily for 4 days   6 tablet   0    BP 121/74  Pulse 86  Temp(Src) 97.7 F (36.5 C) (Oral)  Resp 9  Ht 5\' 4"  (1.626 m)  Wt 185 lb (83.915 kg)  BMI 31.74 kg/m2  SpO2 95%  LMP 12/07/2011 Physical Exam  Nursing note and vitals reviewed. Constitutional: She is oriented to person, place, and time. She appears well-developed and well-nourished. No distress.  HENT:  Head: Normocephalic and atraumatic.  Right Ear: External ear normal.  Left Ear: External ear normal.  Mouth/Throat: No oropharyngeal exudate.  Eyes: Conjunctivae are normal. Right eye exhibits no discharge. Left eye exhibits no discharge.  Neck: Normal range of motion. Neck supple.  Cardiovascular: Normal rate and regular rhythm.   Pulmonary/Chest: Effort normal and breath sounds normal. No respiratory distress. She has no wheezes. She has no rales. She exhibits no tenderness.  Abdominal:  Soft. Bowel sounds are normal. She exhibits no distension and no mass. There is no tenderness. There is no rebound and no guarding.  Musculoskeletal: Normal range of motion. She exhibits no edema and no tenderness.  Neurological: She is alert and oriented to person, place, and time. She has normal reflexes.  Skin: Skin is warm and dry.    ED Course  Procedures (including critical care time) Labs Review Labs Reviewed  CBC - Abnormal; Notable for the following:    Hemoglobin 15.4 (*)    All other components within normal limits  BASIC METABOLIC PANEL - Abnormal; Notable for the following:    Sodium 134 (*)    Potassium 3.2 (*)    Chloride 94 (*)    All other components within normal limits  URINALYSIS, ROUTINE W REFLEX MICROSCOPIC - Abnormal; Notable for the following:    APPearance CLOUDY (*)    Leukocytes, UA TRACE (*)    All other components within normal limits  URINE MICROSCOPIC-ADD ON - Abnormal; Notable for the following:    Squamous Epithelial / LPF MANY (*)    Bacteria, UA MANY (*)    All other components within normal limits  URINE CULTURE  PREGNANCY,  URINE  POCT I-STAT TROPONIN I   Imaging Review Dg Chest 2 View  09/18/2013   CLINICAL DATA:  Chest pain  EXAM: CHEST  2 VIEW  COMPARISON:  01/11/2012  FINDINGS: Cardiomegaly with mild vascular congestion. Minimal basilar atelectasis. Right hemidiaphragm is mildly elevated. No CHF or pneumonia. Negative for effusion or pneumothorax. Trachea midline. Minor degenerative changes of the spine. Prior cholecystectomy.  IMPRESSION: Cardiomegaly with vascular congestion  Low lung volumes with basilar atelectasis   Electronically Signed   By: Daryll Brod M.D.   On: 09/18/2013 11:05    EKG Interpretation    Date/Time:  Sunday September 18 2013 10:38:25 EST Ventricular Rate:  94 PR Interval:  142 QRS Duration: 80 QT Interval:  352 QTC Calculation: 440 R Axis:   97 Text Interpretation:  Normal sinus rhythm Rightward axis Low  voltage QRS Borderline ECG Confirmed by MASNERI  MD, DAVID ([id number]) on 09/18/2013 12:03:35 PM Also confirmed by [facility]  MD, [place] ([id number])  on 09/18/2013 1:49:03 PM           Results for orders placed during the hospital encounter of 09/18/13  CBC      Result Value Range   WBC 8.9  4.0 - 10.5 K/uL   RBC 4.85  3.87 - 5.11 MIL/uL   Hemoglobin 15.4 (*) 12.0 - 15.0 g/dL   HCT 44.5  36.0 - 46.0 %   MCV 91.8  78.0 - 100.0 fL   MCH 31.8  26.0 - 34.0 pg   MCHC 34.6  30.0 - 36.0 g/dL   RDW 13.4  11.5 - 15.5 %   Platelets 251  150 - 400 K/uL  BASIC METABOLIC PANEL      Result Value Range   Sodium 134 (*) 137 - 147 mEq/L   Potassium 3.2 (*) 3.7 - 5.3 mEq/L   Chloride 94 (*) 96 - 112 mEq/L   CO2 27  19 - 32 mEq/L   Glucose, Bld 94  70 - 99 mg/dL   BUN 14  6 - 23 mg/dL   Creatinine, Ser 0.67  0.50 - 1.10 mg/dL   Calcium 9.3  8.4 - 10.5 mg/dL   GFR calc non Af Amer >90  >90 mL/min   GFR calc Af Amer >90  >90 mL/min  PREGNANCY, URINE      Result Value Range   Preg Test, Ur NEGATIVE  NEGATIVE  URINALYSIS, ROUTINE W REFLEX MICROSCOPIC      Result Value Range   Color, Urine YELLOW  YELLOW   APPearance CLOUDY (*) CLEAR   Specific Gravity, Urine 1.015  1.005 - 1.030   pH 7.5  5.0 - 8.0   Glucose, UA NEGATIVE  NEGATIVE mg/dL   Hgb urine dipstick NEGATIVE  NEGATIVE   Bilirubin Urine NEGATIVE  NEGATIVE   Ketones, ur NEGATIVE  NEGATIVE mg/dL   Protein, ur NEGATIVE  NEGATIVE mg/dL   Urobilinogen, UA 0.2  0.0 - 1.0 mg/dL   Nitrite NEGATIVE  NEGATIVE   Leukocytes, UA TRACE (*) NEGATIVE  URINE MICROSCOPIC-ADD ON      Result Value Range   Squamous Epithelial / LPF MANY (*) RARE   WBC, UA 3-6  <3 WBC/hpf   RBC / HPF 0-2  <3 RBC/hpf   Bacteria, UA MANY (*) RARE  POCT I-STAT TROPONIN I      Result Value Range   Troponin i, poc 0.01  0.00 - 0.08 ng/mL   Comment 3  Date: 09/18/2013 @ 1440 EKG # 2  Rate: 85  Rhythm: normal sinus rhythm  QRS Axis: right  Intervals: normal  ST/T  Wave abnormalities: normal  Conduction Disutrbances:none  Narrative Interpretation:   Old EKG Reviewed: unchanged    MDM   1. Chest pain    48 year old female presents emergency department chief complaint chest pain. Patient states the chest pain began last night and resolved. She woke this morning and developed chest pain while at church. She states the pain is worsened by exertion.  Patient has never been risk stratified. She has not seen a cardiologist.  ER course: ER workup negative to include chest x-ray, EKG, troponin, baseline labs. Patient had persistent chest pain which was initially treated with morphine and Toradol. Plan to give nitroglycerin sublingual to see if that improves symptoms. Consult hospitalist  for admission. Patient and her husband were updated. Hospitalist to see patient in the ER and continue further management.  Will defer [facility] or [facility] to inpatient team.    Pt's CP did improve with nitroglycerin.    Elmer Sow, MD 09/18/13 [phone]

## 2013-09-18 NOTE — Progress Notes (Signed)
**Note De-Identified  Obfuscation** RT note: Patient states that she has severe chest pain and demonstrated poor effort with performing peak flow test. PEFR to low to register with several attempts.

## 2013-09-18 NOTE — ED Notes (Signed)
Pt is here with chest pain that radiates through to her back since last nite.  Reports that she was recently treated for asthma and bronchitis but does not feel like this pain is related.

## 2013-09-19 ENCOUNTER — Encounter (HOSPITAL_COMMUNITY): Payer: Self-pay | Admitting: *Deleted

## 2013-09-19 ENCOUNTER — Inpatient Hospital Stay (HOSPITAL_COMMUNITY): Payer: BC Managed Care – PPO

## 2013-09-19 DIAGNOSIS — J45901 Unspecified asthma with (acute) exacerbation: Principal | ICD-10-CM

## 2013-09-19 DIAGNOSIS — J209 Acute bronchitis, unspecified: Secondary | ICD-10-CM

## 2013-09-19 LAB — COMPREHENSIVE METABOLIC PANEL
ALT: 40 U/L — ABNORMAL HIGH (ref 0–35)
AST: 40 U/L — ABNORMAL HIGH (ref 0–37)
Albumin: 3.7 g/dL (ref 3.5–5.2)
Alkaline Phosphatase: 111 U/L (ref 39–117)
BUN: 20 mg/dL (ref 6–23)
CALCIUM: 9 mg/dL (ref 8.4–10.5)
CHLORIDE: 97 meq/L (ref 96–112)
CO2: 25 meq/L (ref 19–32)
CREATININE: 0.7 mg/dL (ref 0.50–1.10)
GFR calc Af Amer: >90 mL/min (ref >90)
Glucose, Bld: 179 mg/dL — ABNORMAL HIGH (ref 70–99)
Potassium: 4 mEq/L (ref 3.7–5.3)
Sodium: 136 mEq/L — ABNORMAL LOW (ref 137–147)
Total Bilirubin: 0.2 mg/dL — ABNORMAL LOW (ref 0.3–1.2)
Total Protein: 7.1 g/dL (ref 6.0–8.3)

## 2013-09-19 LAB — PROTIME-INR
INR: 0.92 (ref 0.00–1.49)
Prothrombin Time: 12.2 seconds (ref 11.6–15.2)

## 2013-09-19 LAB — URINE CULTURE: Colony Count: 4000

## 2013-09-19 LAB — TROPONIN I: Troponin I: <0.3 ng/mL (ref <0.30)

## 2013-09-19 LAB — CBC
HCT: 42.1 % (ref 36.0–46.0)
Hemoglobin: 13.9 g/dL (ref 12.0–15.0)
MCH: 30 pg (ref 26.0–34.0)
MCHC: 33 g/dL (ref 30.0–36.0)
MCV: 90.9 fL (ref 78.0–100.0)
PLATELETS: 234 10*3/uL (ref 150–400)
RBC: 4.63 MIL/uL (ref 3.87–5.11)
RDW: 13.5 % (ref 11.5–15.5)
WBC: 6.9 10*3/uL (ref 4.0–10.5)

## 2013-09-19 LAB — GLUCOSE, CAPILLARY: Glucose-Capillary: 124 mg/dL — ABNORMAL HIGH (ref 70–99)

## 2013-09-19 MED ORDER — BENZONATATE 100 MG PO CAPS
200.0000 mg | ORAL_CAPSULE | Freq: Three times a day (TID) | ORAL | Status: DC
  Administered 2013-09-19 – 2013-09-20 (×4): 200 mg via ORAL
  Filled 2013-09-19 (×8): qty 2

## 2013-09-19 MED ORDER — PREDNISONE 50 MG PO TABS
50.0000 mg | ORAL_TABLET | Freq: Every day | ORAL | Status: DC
  Administered 2013-09-19: 50 mg via ORAL
  Filled 2013-09-19 (×4): qty 1

## 2013-09-19 MED ORDER — IPRATROPIUM-ALBUTEROL 0.5-2.5 (3) MG/3ML IN SOLN
3.0000 mL | Freq: Four times a day (QID) | RESPIRATORY_TRACT | Status: DC
  Administered 2013-09-19 – 2013-09-20 (×5): 3 mL via RESPIRATORY_TRACT
  Filled 2013-09-19 (×5): qty 3

## 2013-09-19 MED ORDER — IOHEXOL 350 MG/ML SOLN
100.0000 mL | Freq: Once | INTRAVENOUS | Status: AC | PRN
  Administered 2013-09-19: 100 mL via INTRAVENOUS

## 2013-09-19 MED ORDER — ALBUTEROL SULFATE (2.5 MG/3ML) 0.083% IN NEBU
2.5000 mg | INHALATION_SOLUTION | RESPIRATORY_TRACT | Status: DC | PRN
  Administered 2013-09-19: 2.5 mg via RESPIRATORY_TRACT
  Filled 2013-09-19: qty 3

## 2013-09-19 MED ORDER — MORPHINE SULFATE 2 MG/ML IJ SOLN
2.0000 mg | INTRAMUSCULAR | Status: DC | PRN
  Administered 2013-09-19 – 2013-09-20 (×3): 2 mg via INTRAVENOUS
  Filled 2013-09-19 (×3): qty 1

## 2013-09-19 MED ORDER — IBUPROFEN 600 MG PO TABS
600.0000 mg | ORAL_TABLET | Freq: Four times a day (QID) | ORAL | Status: DC | PRN
  Administered 2013-09-19: 600 mg via ORAL
  Filled 2013-09-19 (×2): qty 1

## 2013-09-19 NOTE — Progress Notes (Signed)
Wasted 2.5mg  oxycodone po with Marcella RN in the pyxis; discarded in sharps.

## 2013-09-19 NOTE — Progress Notes (Signed)
Patient refused 3rd Troponin level. She stated that she has already had three levels drawn.

## 2013-09-19 NOTE — Progress Notes (Signed)
[facility] PROGRESS NOTE  Melissa Allen [id number] DOB: January 02, 1966 DOA: 09/18/2013 PCP: Arnette Norris, MD  Assessment/Plan: 1. Acute bronchitis/asthma exacerbation: CXR with no pneumonia. Afebrile. Continue symbicort, duoneb, solumdrol, singulair. Will taper down solumderol as there is no wheezing on exam today. Start oral prednisone.  Continues to have significant coughing with conversation. Will need to check 02 sats without 02 and ambulatory sat. 2. Cardiomegaly and possible vascular congestion: ECHO pending 3. Chest pain: improved this am CE's negative, no ekg changes 4. GERD: PPI 5. Pancreatitis: chronic 6. Fibromyalgia 7. Traumatic hematoma of left lower leg  Code Status: full Family Communication: spoke with patient, husband and pastor Disposition Plan: continue   Consultants:  none  Procedures:  none  Antibiotics:  none  HPI/Subjective: Feeling better this morning.  Breathing improved. Chest pain improved.  Objective: Filed Vitals:   09/19/13 0511  BP: 118/66  Pulse: 80  Temp: 98.2 F (36.8 C)  Resp: 16    Intake/Output Summary (Last 24 hours) at 09/19/13 1308 Last data filed at 09/18/13 1755  Gross per 24 hour  Intake    120 ml  Output      0 ml  Net    120 ml   Filed Weights   09/18/13 1039 09/18/13 1703 09/19/13 0511  Weight: 83.915 kg (185 lb) 86.183 kg (190 lb) 88.497 kg (195 lb 1.6 oz)    Exam:   General:  NAD  Cardiovascular: tachy, regular, no mrg  Respiratory: CTAB, short shallow resps  Abdomen: BS+, soft, non tender  Musculoskeletal: no edema  Data Reviewed: Basic Metabolic Panel:  Recent Labs Lab 09/18/13 1233 09/19/13 0107  NA 134* 136*  K 3.2* 4.0  CL 94* 97  CO2 27 25  GLUCOSE 94 179*  BUN 14 20  CREATININE 0.67 0.70  CALCIUM 9.3 9.0   Liver Function Tests:  Recent Labs Lab 09/19/13 0107  AST 40*  ALT 40*  ALKPHOS 111  BILITOT 0.2*  PROT 7.1  ALBUMIN 3.7   No results found for this basename:  LIPASE, AMYLASE,  in the last 168 hours No results found for this basename: AMMONIA,  in the last 168 hours CBC:  Recent Labs Lab 09/18/13 1233 09/19/13 0107  WBC 8.9 6.9  HGB 15.4* 13.9  HCT 44.5 42.1  MCV 91.8 90.9  PLT 251 234   Cardiac Enzymes:  Recent Labs Lab 09/18/13 1825 09/19/13 0107  TROPONINI <0.30 <0.30   BNP (last 3 results) No results found for this basename: PROBNP,  in the last 8760 hours CBG:  Recent Labs Lab 09/18/13 1712 09/19/13 0647  GLUCAP 85 124*    No results found for this or any previous visit (from the past 240 hour(s)).   Studies: Dg Chest 2 View  09/18/2013   CLINICAL DATA:  Chest pain  EXAM: CHEST  2 VIEW  COMPARISON:  01/11/2012  FINDINGS: Cardiomegaly with mild vascular congestion. Minimal basilar atelectasis. Right hemidiaphragm is mildly elevated. No CHF or pneumonia. Negative for effusion or pneumothorax. Trachea midline. Minor degenerative changes of the spine. Prior cholecystectomy.  IMPRESSION: Cardiomegaly with vascular congestion  Low lung volumes with basilar atelectasis   Electronically Signed   By: Daryll Brod M.D.   On: 09/18/2013 11:05    Scheduled Meds: . antiseptic oral rinse  15 mL Mouth Rinse TID PC & HS  . budesonide-formoterol  2 puff Inhalation BID  . DULoxetine  60 mg Oral QHS  . heparin  5,000 Units Subcutaneous Q8H  .  ipratropium-albuterol  3 mL Nebulization Q6H  . losartan  50 mg Oral Daily  . methylPREDNISolone (SOLU-MEDROL) injection  60 mg Intravenous Q6H  . montelukast  10 mg Oral QHS  . pneumococcal 23 valent vaccine  0.5 mL Intramuscular Tomorrow-1000  . traZODone  100 mg Oral QHS  . triamterene-hydrochlorothiazide  1 tablet Oral Daily   Continuous Infusions:   Principal Problem:   Chest pain with low risk of acute coronary syndrome Active Problems:   ASTHMA   REFLUX GASTRITIS   HEPATIC CYST   PANCREATITIS, CHRONIC   FIBROMYALGIA   NEPHROLITHIASIS, HX OF   Asthma with acute exacerbation    Traumatic hematoma of left lower leg    Time spent: 35 minutes    [facility] Pager [phone]. If 7PM-7AM, please contact night-coverage at [email or web address], password [facility] 09/19/2013, 1:08 PM  LOS: 1 day

## 2013-09-20 DIAGNOSIS — I519 Heart disease, unspecified: Secondary | ICD-10-CM

## 2013-09-20 LAB — BASIC METABOLIC PANEL
BUN: 14 mg/dL (ref 6–23)
CO2: 28 mEq/L (ref 19–32)
CREATININE: 0.58 mg/dL (ref 0.50–1.10)
Calcium: 9.3 mg/dL (ref 8.4–10.5)
Chloride: 99 mEq/L (ref 96–112)
GFR calc Af Amer: >90 mL/min (ref >90)
GLUCOSE: 128 mg/dL — AB (ref 70–99)
Potassium: 4.4 mEq/L (ref 3.7–5.3)
Sodium: 140 mEq/L (ref 137–147)

## 2013-09-20 LAB — CBC
HCT: 42.1 % (ref 36.0–46.0)
HEMOGLOBIN: 13.9 g/dL (ref 12.0–15.0)
MCH: 30.4 pg (ref 26.0–34.0)
MCHC: 33 g/dL (ref 30.0–36.0)
MCV: 92.1 fL (ref 78.0–100.0)
Platelets: 261 10*3/uL (ref 150–400)
RBC: 4.57 MIL/uL (ref 3.87–5.11)
RDW: 13.7 % (ref 11.5–15.5)
WBC: 16.3 10*3/uL — ABNORMAL HIGH (ref 4.0–10.5)

## 2013-09-20 LAB — GLUCOSE, CAPILLARY: Glucose-Capillary: 121 mg/dL — ABNORMAL HIGH (ref 70–99)

## 2013-09-20 MED ORDER — AZITHROMYCIN 500 MG PO TABS
500.0000 mg | ORAL_TABLET | Freq: Every day | ORAL | Status: AC
  Administered 2013-09-20: 500 mg via ORAL
  Filled 2013-09-20: qty 1

## 2013-09-20 MED ORDER — GUAIFENESIN ER 600 MG PO TB12
1200.0000 mg | ORAL_TABLET | Freq: Two times a day (BID) | ORAL | Status: DC
  Administered 2013-09-20: 1200 mg via ORAL
  Filled 2013-09-20 (×2): qty 2

## 2013-09-20 MED ORDER — AZITHROMYCIN 250 MG PO TABS: 250.0000 mg | ORAL_TABLET | Freq: Every day | ORAL | Status: DC

## 2013-09-20 MED ORDER — AZITHROMYCIN 250 MG PO TABS: 500.0000 mg | ORAL_TABLET | Freq: Every day | ORAL | Status: DC

## 2013-09-20 MED ORDER — GUAIFENESIN 200 MG PO TABS: 200.0000 mg | ORAL_TABLET | ORAL | Status: DC | PRN

## 2013-09-20 NOTE — Progress Notes (Signed)
  Echocardiogram 2D Echocardiogram has been performed.  Foster, Leslie Langille 07/11/2014, 11:59 AM 

## 2013-09-21 NOTE — Discharge Summary (Signed)
Physician Discharge Summary  Melissa Allen [id number] DOB: 05/25/1966 DOA: 09/18/2013  PCP: Melissa Norris, MD  Admit date: 09/18/2013 Discharge date: 09/21/2013  Time spent:35 minutes  Recommendations for Outpatient Follow-up:  1. Follow up with PCP within 2 weeks of discharge 2. Consider follow up with Pulmonology as outpatient given recurrent bouts of bronchitis/asthma exacerbation  Discharge Diagnoses:  Principal Problem:   Chest pain with low risk of acute coronary syndrome Active Problems:   ASTHMA   REFLUX GASTRITIS   HEPATIC CYST   PANCREATITIS, CHRONIC   FIBROMYALGIA   NEPHROLITHIASIS, HX OF   Asthma with acute exacerbation   Traumatic hematoma of left lower leg   Discharge Condition: stable  Diet recommendation: regular  Filed Weights   09/18/13 1703 09/19/13 0511 09/20/13 0548  Weight: 86.183 kg (190 lb) 88.497 kg (195 lb 1.6 oz) 89.9 kg (198 lb 3.1 oz)    History of present illness:  Melissa Allen is a 48 y.o. female came to [facility] ed 09/18/2013 with chest pressure-patient carries a significant diagnosis of asthma since age of 70, but was seen last at Dr. Durel Salts 09/12/13-thought that she had potentialbronchitis. She had been treated with amoxicillin but then when seen in the office this was transitioned toZ-Pak as well as Best boy. She was given a prednisone burst of 40 twice a day which she obtained on1/5/15 and she was posterior transition to one tablet orally at came to the emergency room because she started having chest pain last night around 11 PM. She states that it was 10 out 10 pain not worsened by any activity or moving was a squeezing pressing type of pain going through and through to her back and felt more like a pressure.  Hospital Course:   1.   Chest pain with low risk of acute coronary syndrome:  Presentation initially worrisome for ACS.  There were not changes on EKG, no events on telemetry and cardiac enzymes negative x 3.  A 2D ECHO was performed  to evaluate for potential cardiac causes of shortness of breath which showed a normal ejection fraction, no valvular abnormalities and the possibility of grade 1 diastolic dysfunction. BNP was normal.   CTA negative for PE or pneumonia. 2.   ASTHMA with acute exacerbation: Cough, shortness of breath and chest pain likely due to asthma exacerbation likely triggered by viral bronchitis.  She is discharged on prednisone taper, cough suppression, mucinex and zpac.  She will folllow up with her PCP and would consider follow up with outpatient Pulmonolgy as well.  At the time of discharge she is breathing easily, 02 sats are in the high 90% at rest and down as low as 94% with exertion.    Procedures:  2D ECHO  Consultations:  none  Discharge Exam: Filed Vitals:   09/20/13 1501  BP: 104/51  Pulse: 116  Temp: 98.2 F (36.8 C)  Resp: 18    General: NAD Cardiovascular: RRR no mrg Respiratory: CTAB, good air movement, no wheezes, rhonchi or rales  Discharge Instructions  Discharge Orders   Future Appointments Provider Department Dept Phone   09/30/2013 11:50 AM Imogene Burn. Georgette Dover, [facility], [place] [phone]   Future Orders Complete By Expires   Call MD for:  difficulty breathing, headache or visual disturbances  As directed    Call MD for:  extreme fatigue  As directed    Call MD for:  severe uncontrolled pain  As directed    Call MD for:  temperature >  100.4  As directed    Diet - low sodium heart healthy  As directed    Discharge instructions  As directed    Increase activity slowly  As directed        Medication List    STOP taking these medications       thiamine 100 MG tablet  Commonly known as:  VITAMIN B-1      TAKE these medications       albuterol 108 (90 BASE) MCG/ACT inhaler  Commonly known as:  PROAIR HFA  Inhale 2 puffs into the lungs every 4 (four) hours as needed. Shortness of breath     azithromycin 250 MG tablet  Commonly known as:   ZITHROMAX  Take 2 tablets (500 mg total) by mouth daily.     benzonatate 200 MG capsule  Commonly known as:  TESSALON  Take 1 capsule (200 mg total) by mouth 3 (three) times daily as needed for cough.     cholecalciferol 1000 UNITS tablet  Commonly known as:  VITAMIN D  Take 1,000 Units by mouth daily.     diphenhydrAMINE 25 MG tablet  Commonly known as:  BENADRYL  Take 25 mg by mouth every 6 (six) hours as needed.     DULoxetine 60 MG capsule  Commonly known as:  CYMBALTA  TAKE ONE CAPSULE BY MOUTH NIGHTLY AT BEDTIME     fluticasone 50 MCG/ACT nasal spray  Commonly known as:  FLONASE  Place 2 sprays into the nose daily as needed for allergies.     furosemide 40 MG tablet  Commonly known as:  LASIX  Take 40 mg by mouth daily as needed for edema.     guaiFENesin 200 MG tablet  Take 1 tablet (200 mg total) by mouth every 4 (four) hours as needed for cough or to loosen phlegm.     ipratropium-albuterol 0.5-2.5 (3) MG/3ML Soln  Commonly known as:  DUONEB  Take 3 mLs by nebulization every 4 (four) hours as needed.     losartan 50 MG tablet  Commonly known as:  COZAAR  TAKE ONE TABLET BY MOUTH ONE TIME DAILY     montelukast 10 MG tablet  Commonly known as:  SINGULAIR  Take 1 tablet (10 mg total) by mouth at bedtime.     oxyCODONE 5 MG immediate release tablet  Commonly known as:  ROXICODONE  Take 0.5 tablets (2.5 mg total) by mouth every 8 (eight) hours as needed for pain.     oxyCODONE-acetaminophen 5-325 MG per tablet  Commonly known as:  PERCOCET/ROXICET  Take 1 tablet by mouth every 4 (four) hours as needed for severe pain.     predniSONE 20 MG tablet  Commonly known as:  DELTASONE  Take 2 tablets (40 mg total) by mouth daily.     SYMBICORT 160-4.5 MCG/ACT inhaler  Generic drug:  budesonide-formoterol  INHALE TWO PUFFS BY MOUTH TWICE DAILY     traZODone 100 MG tablet  Commonly known as:  DESYREL  Take one tablet by mouth   nightly at bedtime      triamterene-hydrochlorothiazide 37.5-25 MG per tablet  Commonly known as:  MAXZIDE-25  Take 1 tablet by mouth daily.       Allergies  Allergen Reactions  . Codeine     REACTION: vomiting  . Hydrocodone Nausea And Vomiting  . Latex Hives  . Lisinopril     Cough       The results of significant diagnostics from this hospitalization (including  imaging, microbiology, ancillary and laboratory) are listed below for reference.    Significant Diagnostic Studies: Dg Chest 2 View  09/18/2013   CLINICAL DATA:  Chest pain  EXAM: CHEST  2 VIEW  COMPARISON:  01/11/2012  FINDINGS: Cardiomegaly with mild vascular congestion. Minimal basilar atelectasis. Right hemidiaphragm is mildly elevated. No CHF or pneumonia. Negative for effusion or pneumothorax. Trachea midline. Minor degenerative changes of the spine. Prior cholecystectomy.  IMPRESSION: Cardiomegaly with vascular congestion  Low lung volumes with basilar atelectasis   Electronically Signed   By: Daryll Brod M.D.   On: 09/18/2013 11:05   Ct Angio Chest Pe W/cm &/or Wo Cm  09/19/2013   CLINICAL DATA:  Chest pain, shortness of breath  EXAM: CT ANGIOGRAPHY CHEST WITH CONTRAST  TECHNIQUE: Multidetector CT imaging of the chest was performed using the standard protocol during bolus administration of intravenous contrast. Multiplanar CT image reconstructions including MIPs were obtained to evaluate the vascular anatomy.  CONTRAST:  153mL OMNIPAQUE IOHEXOL 350 MG/ML SOLN  COMPARISON:  Prior radiograph from 09/18/2013  FINDINGS: No pathologically enlarged mediastinal, hilar, or axillary lymph nodes are identified.  Aorta and great vessels are normal. Heart size is within normal limits. Trace pericardial fluid is noted.  Pulmonary arterial tree is well opacified. No filling defect to suggest acute pulmonary embolism identified. Re-formatted imaging confirms these findings.  Mosaic ground-glass attenuation within the lower lobes bilaterally is likely related  to air trapping. More linear opacity within the lingula and right middle lobe is most consistent with atelectasis. No focal infiltrate identified. No pulmonary edema or pleural effusion.  Hypodense lesions within the liver are again noted, unchanged unchanged. Partially visualized upper abdomen is otherwise unremarkable.  No acute osseous abnormality identified. No focal lytic or blastic osseous lesions.  Bilateral breast implants are in place. The left breast implant is partially decompressed.  IMPRESSION: 1. No CT evidence of acute pulmonary embolism. 2. No acute abnormality identified within the chest.   Electronically Signed   By: Jeannine Boga M.D.   On: 09/19/2013 19:21    Microbiology: Recent Results (from the past 240 hour(s))  URINE CULTURE     Status: None   Collection Time    09/18/13 12:42 PM      Result Value Range Status   Specimen Description URINE, CLEAN CATCH   Final   Special Requests NONE   Final   Culture  Setup Time     Final   Value: 09/18/2013 18:59     Performed at [facility]     Final   Value: 4,000 COLONIES/ML     Performed at [facility]   Culture     Final   Value: INSIGNIFICANT GROWTH     Performed at [facility]   Report Status 09/19/2013 FINAL   Final     Labs: Basic Metabolic Panel:  Recent Labs Lab 09/18/13 1233 09/19/13 0107 09/20/13 0522  NA 134* 136* 140  K 3.2* 4.0 4.4  CL 94* 97 99  CO2 27 25 28   GLUCOSE 94 179* 128*  BUN 14 20 14   CREATININE 0.67 0.70 0.58  CALCIUM 9.3 9.0 9.3   Liver Function Tests:  Recent Labs Lab 09/19/13 0107  AST 40*  ALT 40*  ALKPHOS 111  BILITOT 0.2*  PROT 7.1  ALBUMIN 3.7   No results found for this basename: LIPASE, AMYLASE,  in the last 168 hours No results found for this basename: AMMONIA,  in  the last 168 hours CBC:  Recent Labs Lab 09/18/13 1233 09/19/13 0107 09/20/13 0522  WBC 8.9 6.9 16.3*  HGB 15.4* 13.9 13.9  HCT 44.5 42.1 42.1   MCV 91.8 90.9 92.1  PLT 251 234 261   Cardiac Enzymes:  Recent Labs Lab 09/18/13 1825 09/19/13 0107  TROPONINI <0.30 <0.30   BNP: BNP (last 3 results) No results found for this basename: PROBNP,  in the last 8760 hours CBG:  Recent Labs Lab 09/18/13 1712 09/19/13 0647 09/20/13 0545  GLUCAP 85 124* 121*       Signed:  Zack Crager  [facility] 09/21/2013, 8:30 PM

## 2013-09-23 ENCOUNTER — Other Ambulatory Visit: Payer: Self-pay | Admitting: Family Medicine

## 2013-09-23 ENCOUNTER — Other Ambulatory Visit: Payer: Self-pay | Admitting: *Deleted

## 2013-09-23 MED ORDER — LOSARTAN POTASSIUM 50 MG PO TABS: ORAL_TABLET | ORAL | Status: DC

## 2013-09-30 ENCOUNTER — Encounter (INDEPENDENT_AMBULATORY_CARE_PROVIDER_SITE_OTHER): Payer: BC Managed Care – PPO | Admitting: Surgery

## 2013-09-30 ENCOUNTER — Encounter: Payer: Self-pay | Admitting: Family Medicine

## 2013-09-30 ENCOUNTER — Ambulatory Visit (INDEPENDENT_AMBULATORY_CARE_PROVIDER_SITE_OTHER): Payer: BC Managed Care – PPO | Admitting: Family Medicine

## 2013-09-30 VITALS — BP 142/82 | HR 138 | Temp 98.0°F | Wt 195.8 lb

## 2013-09-30 DIAGNOSIS — R079 Chest pain, unspecified: Secondary | ICD-10-CM

## 2013-09-30 DIAGNOSIS — J45901 Unspecified asthma with (acute) exacerbation: Secondary | ICD-10-CM

## 2013-09-30 DIAGNOSIS — G4700 Insomnia, unspecified: Secondary | ICD-10-CM | POA: Insufficient documentation

## 2013-09-30 DIAGNOSIS — J45909 Unspecified asthma, uncomplicated: Secondary | ICD-10-CM

## 2013-09-30 MED ORDER — ZOLPIDEM TARTRATE 10 MG PO TABS: 10.0000 mg | ORAL_TABLET | Freq: Every evening | ORAL | Status: DC | PRN

## 2013-09-30 MED ORDER — DEXAMETHASONE SODIUM PHOSPHATE 10 MG/ML IJ SOLN
10.0000 mg | Freq: Once | INTRAMUSCULAR | Status: AC
  Administered 2013-09-30: 10 mg via INTRAVENOUS

## 2013-09-30 NOTE — Assessment & Plan Note (Signed)
Deteriorated. Will add Ambien- she has had it past and is aware of addiction potential.

## 2013-09-30 NOTE — Assessment & Plan Note (Signed)
Improved.  Likely due to asthma exacerbation.

## 2013-09-30 NOTE — Addendum Note (Signed)
Addended by: Modena Nunnery on: 09/30/2013 04:33 PM   Modules accepted: Orders

## 2013-09-30 NOTE — Progress Notes (Signed)
Pre-visit discussion using our clinic review tool. No additional management support is needed unless otherwise documented below in the visit note.  

## 2013-09-30 NOTE — Patient Instructions (Signed)
Good to see you. We will call you with your pulmonary appointment.  Say hi to your mom and dad for me.

## 2013-09-30 NOTE — Assessment & Plan Note (Signed)
Not tight but shallow breathing now off pred.  She does not want to restart oral pred if she doesn't have to take it.  Decadron 10 mg IM to help reduce inflammation. Continue inhalers and nebs. Refer back to pulmonary- see phone note- Dr. Malvin Johns did agree to see her. She will call me on Monday with an update.

## 2013-09-30 NOTE — Progress Notes (Signed)
Subjective:    Patient ID: Melissa Allen, female    DOB: Jan 05, 1966, 48 y.o.   MRN: [id number]  HPI Very pleasant 48 yo female with significant history of asthma here for hospital follow up. Notes reviewed- admitted to [facility] 1/11- 09/21/2013 with chest pain.  Was seen in our office on 09/12/2013 by Dr. Silvio Pate for bronchitis/asthma exacerbation.  Appropriately given Zpack, Tessalon and steroid burst.   Came to ER on 1/11 with chest pain- 10/10, non exertional.  EKG and CE were negative. 2 decho unremarkable BNP normal CTA chest neg for PE or PNA.  Felt that CP likely due to her acute asthma exacerbation.   D/c'd home on prednisone taper, mucinex and zpack.  Finished prednisone taper and symptoms have been progressively worsening since.    Ribs hurt.  Constantly has phlegm in her throat.  Using her nebs regularly.  Using symbicort and singulair.  Can't sleep at night.  Has been taking Trazodone for years but no longer effective.  Current Outpatient Prescriptions on File Prior to Visit  Medication Sig Dispense Refill  . albuterol (PROAIR HFA) 108 (90 BASE) MCG/ACT inhaler Inhale 2 puffs into the lungs every 4 (four) hours as needed. Shortness of breath  1 Inhaler  11  . azithromycin (ZITHROMAX) 250 MG tablet Take 2 tablets (500 mg total) by mouth daily.  5 tablet  0  . benzonatate (TESSALON) 200 MG capsule Take 1 capsule (200 mg total) by mouth 3 (three) times daily as needed for cough.  30 capsule  0  . cholecalciferol (VITAMIN D) 1000 UNITS tablet Take 1,000 Units by mouth daily.      . diphenhydrAMINE (BENADRYL) 25 MG tablet Take 25 mg by mouth every 6 (six) hours as needed.      . DULoxetine (CYMBALTA) 60 MG capsule TAKE ONE CAPSULE BY MOUTH NIGHTLY AT BEDTIME   30 capsule  3  . fluticasone (FLONASE) 50 MCG/ACT nasal spray Place 2 sprays into the nose daily as needed for allergies.       . furosemide (LASIX) 40 MG tablet Take 40 mg by mouth daily as needed for edema.      Marland Kitchen guaiFENesin  200 MG tablet Take 1 tablet (200 mg total) by mouth every 4 (four) hours as needed for cough or to loosen phlegm.  30 suppository  0  . ipratropium-albuterol (DUONEB) 0.5-2.5 (3) MG/3ML SOLN Take 3 mLs by nebulization every 4 (four) hours as needed.  360 mL  1  . losartan (COZAAR) 50 MG tablet TAKE ONE TABLET BY MOUTH ONE TIME DAILY  90 tablet  1  . montelukast (SINGULAIR) 10 MG tablet Take 1 tablet (10 mg total) by mouth at bedtime.  30 tablet  5  . oxyCODONE (ROXICODONE) 5 MG immediate release tablet Take 0.5 tablets (2.5 mg total) by mouth every 8 (eight) hours as needed for pain.  20 tablet  0  . oxyCODONE-acetaminophen (PERCOCET/ROXICET) 5-325 MG per tablet Take 1 tablet by mouth every 4 (four) hours as needed for severe pain.  40 tablet  0  . predniSONE (DELTASONE) 20 MG tablet Take 2 tablets (40 mg total) by mouth daily.  15 tablet  0  . SYMBICORT 160-4.5 MCG/ACT inhaler INHALE TWO PUFFS BY MOUTH TWICE DAILY   10.2 g  6  . traZODone (DESYREL) 100 MG tablet Take one tablet by mouth   nightly at bedtime  30 tablet  10  . triamterene-hydrochlorothiazide (MAXZIDE-25) 37.5-25 MG per tablet TAKE 1 TABLET BY  MOUTH DAILY  30 tablet  5   No current facility-administered medications on file prior to visit.    Allergies  Allergen Reactions  . Codeine     REACTION: vomiting  . Hydrocodone Nausea And Vomiting  . Latex Hives  . Lisinopril     Cough     Past Medical History  Diagnosis Date  . Allergy   . Calcaneal fracture   . Obesity (BMI 30.0-34.9)   . Asthma     recent admit to ER 01/11/12 for exac of asthma  . Hypertension   . Fibromyalgia   . Anginal pain     one episode 4-5 years ago-[facility]-nonspecific-no problems since-  . PONV (postoperative nausea and vomiting)   . Kidney stones   . Duplicated ureter, right   . Poor venous access     hard to start iv-  . Wears glasses     Past Surgical History  Procedure Laterality Date  . Tonsillectomy  age 23  .  Bunionectomy  1995    Right  . Cholecystectomy  late 1980's       . Lumbar laminectomy  10/06    L4-5, Ray  . Knee surgery      x 2, Murphy/Sue  . Lithotripsy      Multiple  . R compressed pronator      Sypher  . Tendon repair  2013  . Tendon repair  02/01/12  . Bladder suspension  03/01/2012    Procedure: TRANSVAGINAL TAPE (TVT) PROCEDURE;  Surgeon: Emily Filbert, MD;  Location: [facility];  Service: Gynecology;  Laterality: N/A;  . Cystoscopy  03/01/2012    Procedure: CYSTOSCOPY;  Surgeon: Emily Filbert, MD;  Location: [facility];  Service: Gynecology;  Laterality: N/A;  . Abdominal hysterectomy    . Incision and drainage abscess Left 07/19/2013    Procedure: INCISION AND DRAINAGE LEFT LOWER EXTERMITY HEMATOMA;  Surgeon: Imogene Burn. Georgette Dover, MD;  Location: [facility];  Service: General;  Laterality: Left;  Excision left lower leg mass    Family History  Problem Relation Age of Onset  . Heart disease Father   . Colon polyps Father   . Colon cancer Paternal Grandfather   . Colon cancer Maternal Grandmother   . Uterine cancer      Grandmother  . Breast cancer      Grandmother  . Ovarian cancer      Grandmother  . Diabetes      mother  . Other Neg Hx   . Cancer Mother     pancreatic cancer    History   Social History  . Marital Status: Married    Spouse Name: N/A    Number of Children: 3  . Years of Education: N/A   Occupational History  . 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 Smoking status: Never Smoker   . Smokeless tobacco: Never Used  . Alcohol Use: 3.5 oz/week    7 drink(s) per week     Comment: Occasional  . Drug Use: No  . Sexual Activity: Yes    Birth Control/ Protection: Surgical   Other Topics Concern  . 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  Looks tired HENT:  Mouth/Throat: Oropharynx is clear and moist. No oropharyngeal exudate.  TMs clear bilaterally Neck: Normal range of motion. Neck supple. No thyromegaly present.  Pulmonary/Chest: Effort normal and breath sounds normal. No respiratory distress. She has no wheezes but taking shallow breaths. She has no rales.  Lymphadenopathy:    She has no cervical adenopathy.     Assessment & Plan:

## 2013-10-19 ENCOUNTER — Institutional Professional Consult (permissible substitution): Payer: BC Managed Care – PPO | Admitting: Emergency Medicine

## 2013-10-27 ENCOUNTER — Institutional Professional Consult (permissible substitution): Payer: BC Managed Care – PPO | Admitting: Emergency Medicine

## 2013-10-30 ENCOUNTER — Other Ambulatory Visit: Payer: Self-pay | Admitting: Family Medicine

## 2013-10-31 NOTE — Telephone Encounter (Signed)
Received refill request electronically. Last refill 02/10/11. Is it okay to refill medication?

## 2013-11-09 ENCOUNTER — Ambulatory Visit (INDEPENDENT_AMBULATORY_CARE_PROVIDER_SITE_OTHER): Payer: BC Managed Care – PPO | Admitting: Emergency Medicine

## 2013-11-09 ENCOUNTER — Encounter: Payer: Self-pay | Admitting: Emergency Medicine

## 2013-11-09 VITALS — BP 128/84 | HR 101 | Ht 64.0 in | Wt 196.0 lb

## 2013-11-09 DIAGNOSIS — K219 Gastro-esophageal reflux disease without esophagitis: Secondary | ICD-10-CM

## 2013-11-09 DIAGNOSIS — J45909 Unspecified asthma, uncomplicated: Secondary | ICD-10-CM

## 2013-11-09 MED ORDER — MONTELUKAST SODIUM 10 MG PO TABS: ORAL_TABLET | ORAL | Status: DC

## 2013-11-09 MED ORDER — OMEPRAZOLE 40 MG PO CPDR: 40.0000 mg | DELAYED_RELEASE_CAPSULE | Freq: Every day | ORAL | Status: DC

## 2013-11-09 NOTE — Patient Instructions (Addendum)
Continue your Symbicort. Gargle and rinse after you use it.  Use albuterol as needed Please continue your singulair and benadryl.  Start using an OTC decongestant that contains chlorpheniramine as directed.  Continue your nasal saline washes daily Increase your fluticasone nasal spray to 2 sprays each nostril twice a day Start omeprazole 40mg  daily until next visit (a prescription was sent to your pharmacy) We will perform full pulmonary function testing at your next office visit Follow with Dr Lamonte Sakai next available with full PFT

## 2013-11-09 NOTE — Assessment & Plan Note (Addendum)
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 These are both being driven by rhinitis, GERD. Lengthy and good discussion about the two issues, their crossover, their contributions to her sx and their treatments.  - will continue symbicort for now although this may be a contributor to UA disease.  - aggressively treat both GERD and PND - full PFT to gauge contributions of both issues.

## 2013-11-09 NOTE — Assessment & Plan Note (Signed)
Start omeprazole

## 2013-11-09 NOTE — Progress Notes (Signed)
Subjective:    Patient ID: Melissa Allen, female    DOB: 10-Jul-1966, 48 y.o.   MRN: [id number]  HPI 48 yo woman, never smoker, hx allergic rhinitis, HTN, fibromyalgia, obesity, renal calculi, PUD. Carries a dx of asthma since early 20's and has seen Dr Melvyn Novas in the past, last in 4/14. Her sx include cough, dyspnea, chest tightness, loses her voice espec with talking, singing.  She has wheezing. Sx are poorly controlled.  On symbicort bid, albuterol prn. Uses duonebs prn.  She is on fluticasone nasal spray. Does nasal washes. Singulair. She takes benadryl - has been on zyrtec and allegra in the past. She has never done allergy shots, has tested positive to trees, ragweed (a long time ago). GERD not treated. She has been on prednisone in the past, last time was in January and Feb. Multiple hospitalizations.    Review of Systems  Constitutional: Negative for fever and unexpected weight change.  HENT: Positive for postnasal drip. Negative for congestion, dental problem, ear pain, nosebleeds, rhinorrhea, sinus pressure, sneezing, sore throat and trouble swallowing.   Eyes: Negative for redness and itching.  Respiratory: Positive for cough, chest tightness, shortness of breath and wheezing.   Cardiovascular: Negative for palpitations and leg swelling.  Gastrointestinal: Negative for nausea and vomiting.  Genitourinary: Negative for dysuria.  Musculoskeletal: Positive for joint swelling.       Stiffness   Skin: Negative for rash.  Neurological: Negative for headaches.  Hematological: Does not bruise/bleed easily.  Psychiatric/Behavioral: Negative for dysphoric mood. The patient is nervous/anxious.    Past Medical History  Diagnosis Date  . Allergy   . Calcaneal fracture   . Obesity (BMI 30.0-34.9)   . Asthma     recent admit to ER 01/11/12 for exac of asthma  . Hypertension   . Fibromyalgia   . Anginal pain     one episode 4-5 years ago-[facility]-nonspecific-no problems  since-  . PONV (postoperative nausea and vomiting)   . Kidney stones   . Duplicated ureter, right   . Poor venous access     hard to start iv-  . Wears glasses      Family History  Problem Relation Age of Onset  . Heart disease Father   . Colon polyps Father   . Colon cancer Paternal Grandfather   . Colon cancer Maternal Grandmother   . Uterine cancer      Grandmother  . Breast cancer      Grandmother  . Ovarian cancer      Grandmother  . Diabetes      mother  . Other Neg Hx   . Cancer Mother     pancreatic cancer     History   Social History  . Marital Status: Married    Spouse Name: N/A    Number of Children: 3  . Years of Education: N/A   Occupational History  . 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 Smoking status: Never Smoker   . Smokeless tobacco: Never Used  . Alcohol Use: 3.5 oz/week    7 drink(s) per week     Comment: Occasional  . Drug Use: No  . Sexual Activity: Yes    Birth Control/ Protection: Surgical   Other Topics Concern  . 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 Codeine     REACTION: vomiting  . Hydrocodone Nausea And Vomiting  . Latex Hives  . Lisinopril     Cough      Outpatient Prescriptions Prior to Visit  Medication Sig Dispense Refill  . albuterol (PROAIR HFA) 108 (90 BASE) MCG/ACT inhaler Inhale 2 puffs into the lungs every 4 (four) hours as needed. Shortness of breath  1 Inhaler  11  . benzonatate (TESSALON) 200 MG capsule Take 1 capsule (200 mg total) by mouth 3 (three) times daily as needed for cough.  30 capsule  0  . cholecalciferol (VITAMIN D) 1000 UNITS tablet Take 1,000 Units by mouth daily.      . diphenhydrAMINE (BENADRYL) 25 MG tablet Take 25 mg by mouth every 6 (six) hours as  needed.      . DULoxetine (CYMBALTA) 60 MG capsule TAKE ONE CAPSULE BY MOUTH NIGHTLY AT BEDTIME   30 capsule  3  . fluticasone (FLONASE) 50 MCG/ACT nasal spray Place 2 sprays into the nose daily as needed for allergies.       . furosemide (LASIX) 40 MG tablet Take 40 mg by mouth daily as needed for edema.      Marland Kitchen guaiFENesin 200 MG tablet Take 1 tablet (200 mg total) by mouth every 4 (four) hours as needed for cough or to loosen phlegm.  30 suppository  0  . ipratropium-albuterol (DUONEB) 0.5-2.5 (3) MG/3ML SOLN Take 3 mLs by nebulization every 4 (four) hours as needed.  360 mL  1  . losartan (COZAAR) 50 MG tablet TAKE ONE TABLET BY MOUTH ONE TIME DAILY  90 tablet  1  . SYMBICORT 160-4.5 MCG/ACT inhaler INHALE TWO PUFFS BY MOUTH TWICE DAILY   10.2 g  6  . traZODone (DESYREL) 100 MG tablet Take one tablet by mouth   nightly at bedtime  30 tablet  10  . triamterene-hydrochlorothiazide (MAXZIDE-25) 37.5-25 MG per tablet TAKE 1 TABLET BY MOUTH DAILY  30 tablet  5  . zolpidem (AMBIEN) 10 MG tablet Take 1 tablet (10 mg total) by mouth at bedtime as needed for sleep.  30 tablet  1  . oxyCODONE-acetaminophen (PERCOCET/ROXICET) 5-325 MG per tablet Take 1 tablet by mouth every 4 (four) hours as needed for severe pain.  40 tablet  0  . SINGULAIR 10 MG tablet Take one tablet by mouth   nightly at bedtime  30 tablet  4  . oxyCODONE (ROXICODONE) 5 MG immediate release tablet Take 0.5 tablets (2.5 mg total) by mouth every 8 (eight) hours as needed for pain.  20 tablet  0   No facility-administered medications prior to visit.         Objective:   Physical Exam Filed Vitals:   11/09/13 1618  BP: 128/84  Pulse: 101  Height: 5\' 4"  (1.626 m)  Weight: 196 lb (88.905 kg)  SpO2: 98%   Gen: Pleasant, overwt, in no distress,  normal affect  ENT: No lesions,  mouth clear,  oropharynx clear, mild PND, hoarse voice, freq cough and throat clearing  Neck: No JVD, no TMG, no carotid bruits, no stridor  Lungs:  No use of accessory muscles, clear without rales or rhonchi  Cardiovascular: RRR, heart sounds normal, no murmur or gallops, no peripheral edema  Musculoskeletal: No deformities, no cyanosis or clubbing  Neuro: alert, non focal  Skin: Warm,  no lesions or rashes     Assessment & Plan:  GERD Start omeprazole  ASTHMA 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 These are both being driven by rhinitis, GERD. Lengthy and good discussion about the two issues, their crossover, their contributions to her sx and their treatments.  - will continue symbicort for now although this may be a contributor to UA disease.  - aggressively treat both GERD and PND - full PFT to gauge contributions of both issues.

## 2013-11-21 ENCOUNTER — Ambulatory Visit (INDEPENDENT_AMBULATORY_CARE_PROVIDER_SITE_OTHER): Payer: BC Managed Care – PPO | Admitting: Internal Medicine

## 2013-11-21 ENCOUNTER — Encounter: Payer: Self-pay | Admitting: Internal Medicine

## 2013-11-21 VITALS — BP 126/88 | HR 110 | Temp 98.5°F | Wt 189.8 lb

## 2013-11-21 DIAGNOSIS — J209 Acute bronchitis, unspecified: Secondary | ICD-10-CM

## 2013-11-21 MED ORDER — AZITHROMYCIN 250 MG PO TABS: ORAL_TABLET | ORAL | Status: DC

## 2013-11-21 NOTE — Patient Instructions (Signed)

## 2013-11-21 NOTE — Progress Notes (Signed)
Pre visit review using our clinic review tool, if applicable. No additional management support is needed unless otherwise documented below in the visit note. 

## 2013-11-21 NOTE — Progress Notes (Signed)
HPI  Pt presents to the clinic today with c/o cough, shortness of breath, wheezing and ear fullness. She reports this started 3 days  ago. The cough is productive of thick green mucous. She denies fever, but has had chills and body aches. She has tried her inhalers and nebulizers without much relief. She has tried leftover tessalon without much relief. She recently saw pulmonology who thought her asthma might be misdiagnosed. He is currently treating her for reflux and PND.  Review of Systems      Past Medical History  Diagnosis Date  . Allergy   . Calcaneal fracture   . Obesity (BMI 30.0-34.9)   . Asthma     recent admit to ER 01/11/12 for exac of asthma  . Hypertension   . Fibromyalgia   . Anginal pain     one episode 4-5 years ago-[facility]-nonspecific-no problems since-  . PONV (postoperative nausea and vomiting)   . Kidney stones   . Duplicated ureter, right   . Poor venous access     hard to start iv-  . Wears glasses     Family History  Problem Relation Age of Onset  . Heart disease Father   . Colon polyps Father   . Colon cancer Paternal Grandfather   . Colon cancer Maternal Grandmother   . Uterine cancer      Grandmother  . Breast cancer      Grandmother  . Ovarian cancer      Grandmother  . Diabetes      mother  . Other Neg Hx   . Cancer Mother     pancreatic cancer    History   Social History  . Marital Status: Married    Spouse Name: N/A    Number of Children: 3  . Years of Education: N/A   Occupational History  . 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 Smoking status: Never Smoker   . Smokeless tobacco: Never Used  . Alcohol Use: 3.5 oz/week    7 drink(s) per week     Comment: Occasional  . Drug Use: No  . Sexual Activity: Yes    Birth Control/ Protection: Surgical   Other Topics Concern  . 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 Codeine     REACTION: vomiting  . Hydrocodone Nausea And Vomiting  . Latex Hives  . Lisinopril     Cough      Constitutional: Positive headache, fatigue. Denies fever or abrupt weight changes.  HEENT:  Positive sore throat. Denies eye redness, eye pain, pressure behind the eyes, facial pain, nasal congestion, ear pain, ringing in the ears, wax buildup, runny nose or bloody nose. Respiratory: Positive cough. Denies difficulty breathing or shortness of breath.  Cardiovascular: Denies chest pain, chest tightness, palpitations or swelling in the hands or feet.   No other specific complaints in a complete review of systems (except as listed in HPI above).  Objective:   BP 126/88  Pulse 110  Temp(Src) 98.5 F (36.9 C) (Oral)  Wt 189 lb 12 oz (86.07 kg)  SpO2 98%  LMP 12/07/2011 Wt Readings from Last 3 Encounters:  11/21/13 189 lb 12  oz (86.07 kg)  11/09/13 196 lb (88.905 kg)  09/30/13 195 lb 12 oz (88.792 kg)     General: Appears her stated age, well developed, well nourished in NAD. HEENT: Head: normal shape and size; Eyes: sclera white, no icterus, conjunctiva pink, PERRLA and EOMs intact; Ears: Tm's gray and intact, normal light reflex; Nose: mucosa pink and moist, septum midline; Throat/Mouth: + PND. Teeth present, mucosa erythematous and moist, no exudate noted, no lesions or ulcerations noted.  Neck: Mild cervical lymphadenopathy. Neck supple, trachea midline. No massses, lumps or thyromegaly present.  Cardiovascular: Normal rate and rhythm. S1,S2 noted.  No murmur, rubs or gallops noted. No JVD or BLE edema. No carotid bruits noted. Pulmonary/Chest: Normal effort and wheezing noted in the RUL, scattered rhonchi in the basis. No respiratory distress. No wheezes, rales or ronchi noted.      Assessment & Plan:   Acute  Bronchitis:  Get some rest and drink plenty of water Do salt water gargles for the sore throat eRx for Azithromax x 5 days 80 mg Depo IM today  RTC as needed or if symptoms persist.

## 2013-11-22 ENCOUNTER — Other Ambulatory Visit: Payer: Self-pay

## 2013-11-22 MED ORDER — ZOLPIDEM TARTRATE 10 MG PO TABS: 10.0000 mg | ORAL_TABLET | Freq: Every evening | ORAL | Status: DC | PRN

## 2013-11-22 MED ORDER — METHYLPREDNISOLONE ACETATE 40 MG/ML IJ SUSP
80.0000 mg | Freq: Once | INTRAMUSCULAR | Status: AC
  Administered 2013-11-22: 80 mg via INTRAMUSCULAR

## 2013-11-22 NOTE — Addendum Note (Signed)
Addended by: Lurlean Nanny on: 11/22/2013 08:42 AM   Modules accepted: Orders

## 2013-11-22 NOTE — Telephone Encounter (Signed)
Pt was seen by [facility] yesterday and wanted me to send in a request for a refill on her Ambien--last filled 09/30/13--please advise if okay to phone in

## 2013-11-22 NOTE — Telephone Encounter (Signed)
Spoke to pt and informed her Rx has been called in to requested pharmacy 

## 2013-12-23 ENCOUNTER — Other Ambulatory Visit: Payer: Self-pay | Admitting: Family Medicine

## 2013-12-23 NOTE — Telephone Encounter (Signed)
Spoke to pt and informed her Rx has been called in to requested pharmacy 

## 2013-12-23 NOTE — Telephone Encounter (Signed)
Pt requesting medication refill. Last ov 01/23-acute with no f/u appt scheduled. pls advise

## 2013-12-29 ENCOUNTER — Other Ambulatory Visit: Payer: Self-pay | Admitting: Internal Medicine

## 2013-12-30 ENCOUNTER — Other Ambulatory Visit: Payer: Self-pay | Admitting: Internal Medicine

## 2013-12-30 NOTE — Telephone Encounter (Signed)
Last OV with you was 09/30/13--Rx was last refilled 12/14 by [facility] with 3 refills--please advise

## 2014-01-12 ENCOUNTER — Ambulatory Visit (INDEPENDENT_AMBULATORY_CARE_PROVIDER_SITE_OTHER): Payer: BC Managed Care – PPO | Admitting: Internal Medicine

## 2014-01-12 ENCOUNTER — Encounter: Payer: Self-pay | Admitting: Internal Medicine

## 2014-01-12 ENCOUNTER — Ambulatory Visit (INDEPENDENT_AMBULATORY_CARE_PROVIDER_SITE_OTHER)
Admission: RE | Admit: 2014-01-12 | Discharge: 2014-01-12 | Disposition: A | Discharge status: Home / Self Care | Payer: BC Managed Care – PPO | Source: Ambulatory Visit | Attending: Internal Medicine | Admitting: Internal Medicine

## 2014-01-12 VITALS — BP 126/98 | HR 113 | Temp 98.3°F | Wt 186.2 lb

## 2014-01-12 DIAGNOSIS — M542 Cervicalgia: Secondary | ICD-10-CM

## 2014-01-12 DIAGNOSIS — F32A Depression, unspecified: Secondary | ICD-10-CM

## 2014-01-12 DIAGNOSIS — F3289 Other specified depressive episodes: Secondary | ICD-10-CM

## 2014-01-12 DIAGNOSIS — F329 Major depressive disorder, single episode, unspecified: Secondary | ICD-10-CM

## 2014-01-12 DIAGNOSIS — M5412 Radiculopathy, cervical region: Secondary | ICD-10-CM

## 2014-01-12 MED ORDER — PREDNISONE 10 MG PO TABS: ORAL_TABLET | ORAL | Status: DC

## 2014-01-12 NOTE — Progress Notes (Signed)
Pre visit review using our clinic review tool, if applicable. No additional management support is needed unless otherwise documented below in the visit note. 

## 2014-01-12 NOTE — Progress Notes (Signed)
Subjective:    Patient ID: Melissa Allen, female    DOB: 12/27/1965, 48 y.o.   MRN: [id number]  HPI  Pt presents to the clinic today with c/o neck pain and right arm pain. She has numbness that radiates into the down the arm. This started 2 months ago but has gotten worse in the last 2 weeks. She reports she has been helping to take care of her mother, who is very ill and quite heavy. She is not sure is she pulled something or injured something while trying to lift up her mother. She has tried advil, heat and ice without any relief. Additionally, she is very upset today. Her mother is actively dying at [facility] from cancer. This is causing her to be very stressed and and depressed.  Review of Systems      Past Medical History  Diagnosis Date  . Allergy   . Calcaneal fracture   . Obesity (BMI 30.0-34.9)   . Asthma     recent admit to ER 01/11/12 for exac of asthma  . Hypertension   . Fibromyalgia   . Anginal pain     one episode 4-5 years ago-[facility]-nonspecific-no problems since-  . PONV (postoperative nausea and vomiting)   . Kidney stones   . Duplicated ureter, right   . Poor venous access     hard to start iv-  . Wears glasses     Current Outpatient Prescriptions  Medication Sig Dispense Refill  . albuterol (PROAIR HFA) 108 (90 BASE) MCG/ACT inhaler Inhale 2 puffs into the lungs every 4 (four) hours as needed. Shortness of breath  1 Inhaler  11  . chlorpheniramine (CHLOR-TRIMETON) 4 MG tablet Take 4 mg by mouth 2 (two) times daily as needed for allergies.      . DULoxetine (CYMBALTA) 60 MG capsule TAKE ONE CAPSULE BY MOUTH NIGHTLY AT BEDTIME   30 capsule  2  . fluticasone (FLONASE) 50 MCG/ACT nasal spray Place 2 sprays into the nose daily as needed for allergies.       . furosemide (LASIX) 40 MG tablet Take 40 mg by mouth daily as needed for edema.      Marland Kitchen ipratropium-albuterol (DUONEB) 0.5-2.5 (3) MG/3ML SOLN Take 3 mLs by nebulization every 4 (four)  hours as needed.  360 mL  1  . losartan (COZAAR) 50 MG tablet TAKE ONE TABLET BY MOUTH ONE TIME DAILY  90 tablet  1  . montelukast (SINGULAIR) 10 MG tablet Take one tablet by mouth   nightly at bedtime  30 tablet  4  . omeprazole (PRILOSEC) 40 MG capsule Take 1 capsule (40 mg total) by mouth daily.  30 capsule  3  . oxyCODONE (ROXICODONE) 5 MG immediate release tablet Take 0.5 tablets (2.5 mg total) by mouth every 8 (eight) hours as needed for pain.  20 tablet  0  . SYMBICORT 160-4.5 MCG/ACT inhaler INHALE TWO PUFFS BY MOUTH TWICE DAILY   10.2 g  6  . traZODone (DESYREL) 100 MG tablet Take one tablet by mouth   nightly at bedtime  30 tablet  10  . triamterene-hydrochlorothiazide (MAXZIDE-25) 37.5-25 MG per tablet TAKE 1 TABLET BY MOUTH DAILY  30 tablet  5  . zolpidem (AMBIEN) 10 MG tablet TAKE ONE TABLET AT BEDTIME DAILY AS NEEDED FOR SLEEP  30 tablet  0   No current facility-administered medications for this visit.    Allergies  Allergen Reactions  . Codeine     REACTION:  vomiting  . Hydrocodone Nausea And Vomiting  . Latex Hives  . Lisinopril     Cough     Family History  Problem Relation Age of Onset  . Heart disease Father   . Colon polyps Father   . Colon cancer Paternal Grandfather   . Colon cancer Maternal Grandmother   . Uterine cancer      Grandmother  . Breast cancer      Grandmother  . Ovarian cancer      Grandmother  . Diabetes      mother  . Other Neg Hx   . Cancer Mother     pancreatic cancer    History   Social History  . Marital Status: Married    Spouse Name: N/A    Number of Children: 3  . Years of Education: N/A   Occupational History  . 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 Smoking status: Never Smoker   . Smokeless tobacco: Never Used  . Alcohol Use: 3.5 oz/week    7 drink(s) per week     Comment: Occasional  . Drug Use: No  . Sexual Activity: Yes    Birth Control/ Protection: Surgical   Other  Topics Concern  . 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  Musculoskeletal: Pt reports neck pain. Denies , difficulty with gait, or joint pain and swelling.   Neurological: Pt reports radiating pain from right arm to her neck .Denies dizziness, difficulty with memory, difficulty with speech or problems with balance and coordination.  Psych: Pt reports depression and anxiety. Denies SI/HI.  No other specific complaints in a complete review of systems (except as listed in HPI above).  Objective:   Physical Exam   BP 126/98  Pulse 113  Temp(Src) 98.3 F (36.8 C) (Oral)  Wt 186 lb 4 oz (84.482 kg)  SpO2 97%  LMP 12/07/2011 Wt Readings from Last 3 Encounters:  01/12/14 186 lb 4 oz (84.482 kg)  11/21/13 189 lb 12 oz (86.07 kg)  11/09/13 196 lb (88.905 kg)    General: Appears her stated age, obese well developed, well nourished in NAD. Cardiovascular: Normal rate and rhythm. S1,S2 noted.  No murmur, rubs or gallops noted. No JVD or BLE edema. No carotid bruits noted. Pulmonary/Chest: Normal effort and positive vesicular breath sounds. No respiratory distress. No wheezes, rales or ronchi noted.  Musculoskeletal: Decreased internal rotation secondary to pain. Negative drop can test. She has pain in the supraspinous muscle and glenohumeral joint. Strength 5/5 BUE. Normal flexion, extension and lateral rotation of the neck.  Psych: Very tearful, mood depressed. Judgement and thought content normal.   BMET    Component Value Date/Time   NA 140 09/20/2013 0522   K 4.4 09/20/2013 0522   CL 99 09/20/2013 0522   CO2 28 09/20/2013 0522   GLUCOSE 128* 09/20/2013 0522   BUN 14 09/20/2013 0522   CREATININE 0.58 09/20/2013 0522   CREATININE 0.74  08/04/2011 1757   CALCIUM 9.3 09/20/2013 0522   GFRNONAA >90 09/20/2013 0522   GFRAA >90 09/20/2013 0522    Lipid Panel     Component Value Date/Time  CHOL 244* 08/04/2011 1757   TRIG 159* 08/04/2011 1757   HDL 71 08/04/2011 1757   CHOLHDL 3.4 08/04/2011 1757   VLDL 32 08/04/2011 1757   LDLCALC 141* 08/04/2011 1757    CBC    Component Value Date/Time   WBC 16.3* 09/20/2013 0522   RBC 4.57 09/20/2013 0522   HGB 13.9 09/20/2013 0522   HCT 42.1 09/20/2013 0522   PLT 261 09/20/2013 0522   MCV 92.1 09/20/2013 0522   MCH 30.4 09/20/2013 0522   MCHC 33.0 09/20/2013 0522   RDW 13.7 09/20/2013 0522   LYMPHSABS 2.3 08/04/2011 1757   MONOABS 0.6 08/04/2011 1757   EOSABS 0.2 08/04/2011 1757   BASOSABS 0.1 08/04/2011 1757    Hgb A1C No results found for this basename: HGBA1C        Assessment & Plan:   Cervical radiculopathy:  Will obtain xray of cervical spine today May need MRI eRx for prednisone x 6 days Take tramadol for comfort A heating pad may help  Depression, situational:  She is very upset today She is on cymbalta and declines starting zoloft today  RTC as needed or if pain persist or worsens

## 2014-01-12 NOTE — Patient Instructions (Addendum)
Cervical Radiculopathy  Cervical radiculopathy happens when a nerve in the neck is pinched or bruised by a slipped (herniated) disk or by arthritic changes in the bones of the cervical spine. This can occur due to an injury or as part of the normal aging process. Pressure on the cervical nerves can cause pain or numbness that runs from your neck all the way down into your arm and fingers.  CAUSES   There are many possible causes, including:  · Injury.  · Muscle tightness in the neck from overuse.  · Swollen, painful joints (arthritis).  · Breakdown or degeneration in the bones and joints of the spine (spondylosis) due to aging.  · Bone spurs that may develop near the cervical nerves.  SYMPTOMS   Symptoms include pain, weakness, or numbness in the affected arm and hand. Pain can be severe or irritating. Symptoms may be worse when extending or turning the neck.  DIAGNOSIS   Your caregiver will ask about your symptoms and do a physical exam. He or she may test your strength and reflexes. X-rays, CT scans, and MRI scans may be needed in cases of injury or if the symptoms do not go away after a period of time. Electromyography (EMG) or nerve conduction testing may be done to study how your nerves and muscles are working.  TREATMENT   Your caregiver may recommend certain exercises to help relieve your symptoms. Cervical radiculopathy can, and often does, get better with time and treatment. If your problems continue, treatment options may include:  · Wearing a soft collar for short periods of time.  · Physical therapy to strengthen the neck muscles.  · Medicines, such as nonsteroidal anti-inflammatory drugs (NSAIDs), oral corticosteroids, or spinal injections.  · Surgery. Different types of surgery may be done depending on the cause of your problems.  HOME CARE INSTRUCTIONS   · Put ice on the affected area.  · Put ice in a plastic bag.  · Place a towel between your skin and the bag.  · Leave the ice on for 15-20 minutes,  03-04 times a day or as directed by your caregiver.  · If ice does not help, you can try using heat. Take a warm shower or bath, or use a hot water bottle as directed by your caregiver.  · You may try a gentle neck and shoulder massage.  · Use a flat pillow when you sleep.  · Only take over-the-counter or prescription medicines for pain, discomfort, or fever as directed by your caregiver.  · If physical therapy was prescribed, follow your caregiver's directions.  · If a soft collar was prescribed, use it as directed.  SEEK IMMEDIATE MEDICAL CARE IF:   · Your pain gets much worse and cannot be controlled with medicines.  · You have weakness or numbness in your hand, arm, face, or leg.  · You have a high fever or a stiff, rigid neck.  · You lose bowel or bladder control (incontinence).  · You have trouble with walking, balance, or speaking.  MAKE SURE YOU:   · Understand these instructions.  · Will watch your condition.  · Will get help right away if you are not doing well or get worse.  Document Released: 05/20/2001 Document Revised: 11/17/2011 Document Reviewed: 04/08/2011  Exit[employer]® Patient Information ©2014 [employer].

## 2014-01-12 NOTE — Addendum Note (Signed)
Addended by: Lurlean Nanny on: 01/12/2014 05:03 PM   Modules accepted: Orders

## 2014-01-23 ENCOUNTER — Other Ambulatory Visit: Payer: Self-pay | Admitting: Family Medicine

## 2014-01-23 NOTE — Telephone Encounter (Signed)
Pt requesting medication refill. Last ov 01/2014-acute with no future appts scheduled. pls advise

## 2014-01-23 NOTE — Telephone Encounter (Signed)
Spoke to pt and informed her Rx has been sent to requested pharmacy 

## 2014-01-26 ENCOUNTER — Encounter: Payer: Self-pay | Admitting: Family Medicine

## 2014-01-26 ENCOUNTER — Ambulatory Visit (INDEPENDENT_AMBULATORY_CARE_PROVIDER_SITE_OTHER): Payer: BC Managed Care – PPO | Admitting: Family Medicine

## 2014-01-26 VITALS — BP 126/66 | HR 125 | Temp 98.5°F | Wt 186.5 lb

## 2014-01-26 DIAGNOSIS — F411 Generalized anxiety disorder: Secondary | ICD-10-CM

## 2014-01-26 DIAGNOSIS — F4321 Adjustment disorder with depressed mood: Secondary | ICD-10-CM

## 2014-01-26 MED ORDER — ALPRAZOLAM 0.25 MG PO TABS
ORAL_TABLET | ORAL | Status: DC
Start: 2014-01-26 — End: 2014-03-07

## 2014-01-26 MED ORDER — DULOXETINE HCL 60 MG PO CPEP: ORAL_CAPSULE | ORAL | Status: DC

## 2014-01-26 NOTE — Assessment & Plan Note (Signed)
>  25 minutes spent in face to face time with patient, >50% spent in counselling or coordination of care She is not depressed or anxious- reassured her that this is all normal part of acute process of watching her mother die. Will give rx for xanax to use prn- discussed sedation and addiction potential. She will update me.

## 2014-01-26 NOTE — Patient Instructions (Signed)
Please call me anytime- [phone]  Give my love to your family.

## 2014-01-26 NOTE — Progress Notes (Signed)
Pre visit review using our clinic review tool, if applicable. No additional management support is needed unless otherwise documented below in the visit note. 

## 2014-01-26 NOTE — Progress Notes (Signed)
Subjective:   Patient ID: Melissa Allen, female    DOB: 11-02-1965, 48 y.o.   MRN: [id number]  KARELY HURTADO is a pleasant 48 y.o. year old female who presents to clinic today with Anxiety  on 01/26/2014  HPI: Here for ? Anxiety.  Her mother is actively dying- she is at the hospice home. Elianna is tired, constantly anxious and tearful. Not sleeping well.  Has accepted her mom is ready to die.  Her mom is crying out in pain, not eating or talking.  Very distended due to fluid.  Her father and sister are in denial.  She is taking her cymbalta (for fibromyalgia) nightly.  Patient Active Problem List   Diagnosis Date Noted  . Grief 01/26/2014  . Insomnia 09/30/2013  . Chest pain with low risk of acute coronary syndrome 09/18/2013  . Traumatic hematoma of left lower leg 07/18/2013  . Left leg swelling 06/16/2013  . Left leg pain 06/16/2013  . Edema 05/29/2013  . Bilateral knee pain 05/17/2013  . Urine frequency 12/22/2012  . Dizziness 12/22/2012  . Chest wall pain 09/28/2012  . HEADACHE 04/26/2010  . FIBROMYALGIA 01/25/2010  . ANXIETY 01/22/2010  . DEPRESSION 01/22/2010  . REFLUX GASTRITIS 01/22/2010  . HEPATIC CYST 01/22/2010  . NEPHROLITHIASIS, HX OF 01/22/2010  . HYPERTENSION 01/01/2009  . PALPITATIONS 01/11/2008  . CONTACT DERMATITIS&OTHER ECZEMA DUE TO PLANTS 12/27/2007  . ASTHMA 11/26/2007  . GERD 11/26/2007  . PANCREATITIS, CHRONIC 11/26/2007  . RENAL CALCULUS 11/26/2007  . ARTHRITIS 11/26/2007  . ALLERGIC RHINITIS 11/24/2007   Past Medical History  Diagnosis Date  . Allergy   . Calcaneal fracture   . Obesity (BMI 30.0-34.9)   . Asthma     recent admit to ER 01/11/12 for exac of asthma  . Hypertension   . Fibromyalgia   . Anginal pain     one episode 4-5 years ago-[facility] [place]-nonspecific-no problems since-  . PONV (postoperative nausea and vomiting)   . Kidney stones   . Duplicated ureter, right   . Poor venous access     hard to start  iv-  . Wears glasses    Past Surgical History  Procedure Laterality Date  . Tonsillectomy  age 56  . Bunionectomy  1995    Right  . Cholecystectomy  late 1980's       . Lumbar laminectomy  10/06    L4-5, Ray  . Knee surgery      x 2, Murphy/Sue  . Lithotripsy      Multiple  . R compressed pronator      Sypher  . Tendon repair  2013  . Tendon repair  02/01/12  . Bladder suspension  03/01/2012    Procedure: TRANSVAGINAL TAPE (TVT) PROCEDURE;  Surgeon: Emily Filbert, MD;  Location: [facility];  Service: Gynecology;  Laterality: N/A;  . Cystoscopy  03/01/2012    Procedure: CYSTOSCOPY;  Surgeon: Emily Filbert, MD;  Location: [facility];  Service: Gynecology;  Laterality: N/A;  . Abdominal hysterectomy    . Incision and drainage abscess Left 07/19/2013    Procedure: INCISION AND DRAINAGE LEFT LOWER EXTERMITY HEMATOMA;  Surgeon: Imogene Burn. Georgette Dover, MD;  Location: [facility];  Service: General;  Laterality: Left;  Excision left lower leg mass   History  Substance Use Topics  . Smoking status: Never Smoker   . Smokeless tobacco: Never Used  . Alcohol Use: 3.5 oz/week    7 drink(s) per week  Comment: Occasional   Family History  Problem Relation Age of Onset  . Heart disease Father   . Colon polyps Father   . Colon cancer Paternal Grandfather   . Colon cancer Maternal Grandmother   . Uterine cancer      Grandmother  . Breast cancer      Grandmother  . Ovarian cancer      Grandmother  . Diabetes      mother  . Other Neg Hx   . Cancer Mother     pancreatic cancer   Allergies  Allergen Reactions  . Codeine     REACTION: vomiting  . Hydrocodone Nausea And Vomiting  . Latex Hives  . Lisinopril     Cough    Current Outpatient Prescriptions on File Prior to Visit  Medication Sig Dispense Refill  . albuterol (PROAIR HFA) 108 (90 BASE) MCG/ACT inhaler Inhale 2 puffs into the lungs every 4 (four) hours as needed. Shortness of breath  1 Inhaler  11  . chlorpheniramine  (CHLOR-TRIMETON) 4 MG tablet Take 4 mg by mouth 2 (two) times daily as needed for allergies.      . fluticasone (FLONASE) 50 MCG/ACT nasal spray Place 2 sprays into the nose daily as needed for allergies.       . furosemide (LASIX) 40 MG tablet Take 40 mg by mouth daily as needed for edema.      Marland Kitchen ipratropium-albuterol (DUONEB) 0.5-2.5 (3) MG/3ML SOLN Take 3 mLs by nebulization every 4 (four) hours as needed.  360 mL  1  . losartan (COZAAR) 50 MG tablet TAKE ONE TABLET BY MOUTH ONE TIME DAILY  90 tablet  1  . montelukast (SINGULAIR) 10 MG tablet Take one tablet by mouth   nightly at bedtime  30 tablet  4  . omeprazole (PRILOSEC) 40 MG capsule Take 1 capsule (40 mg total) by mouth daily.  30 capsule  3  . SYMBICORT 160-4.5 MCG/ACT inhaler INHALE TWO PUFFS BY MOUTH TWICE DAILY   10.2 g  6  . traZODone (DESYREL) 100 MG tablet Take one tablet by mouth   nightly at bedtime  30 tablet  10  . triamterene-hydrochlorothiazide (MAXZIDE-25) 37.5-25 MG per tablet TAKE 1 TABLET BY MOUTH DAILY  30 tablet  5  . zolpidem (AMBIEN) 10 MG tablet TAKE ONE TABLET AT BEDTIME DAILY AS NEEDED FOR SLEEP  30 tablet  0   No current facility-administered medications on file prior to visit.   The PMH, PSH, Social History, Family History, Medications, and allergies have been reviewed in [facility], and have been updated if relevant.   Review of Systems See HPI No SI No HI     Objective:    BP 126/66  Pulse 125  Temp(Src) 98.5 F (36.9 C) (Oral)  Wt 186 lb 8 oz (84.596 kg)  SpO2 97%  LMP 12/07/2011   Physical Exam Gen:  Alert, pleasant, tearful Psych:  Good eye contact, tearful but appropriate       Assessment & Plan:   Grief No Follow-up on file.

## 2014-02-25 ENCOUNTER — Other Ambulatory Visit: Payer: Self-pay | Admitting: Family Medicine

## 2014-02-27 NOTE — Telephone Encounter (Signed)
Spoke to pt and informed her Rx has been faxed to requested pharmacy 

## 2014-02-27 NOTE — Telephone Encounter (Signed)
Last ov 01/26/14, rx last written 01/23/14.

## 2014-03-03 ENCOUNTER — Ambulatory Visit (INDEPENDENT_AMBULATORY_CARE_PROVIDER_SITE_OTHER): Payer: BC Managed Care – PPO | Admitting: Obstetrics & Gynecology

## 2014-03-03 ENCOUNTER — Encounter: Payer: Self-pay | Admitting: Obstetrics & Gynecology

## 2014-03-03 VITALS — BP 119/81 | HR 122 | Ht 63.0 in | Wt 187.2 lb

## 2014-03-03 DIAGNOSIS — Z01419 Encounter for gynecological examination (general) (routine) without abnormal findings: Secondary | ICD-10-CM

## 2014-03-03 DIAGNOSIS — Z0000 Encounter for general adult medical examination without abnormal findings: Secondary | ICD-10-CM

## 2014-03-03 NOTE — Progress Notes (Signed)
Subjective:    Melissa Allen is a 48 y.o. female who presents for an annual exam. The patient has no complaints today. The patient  sexually active. GYN screening history: last pap: was normal. The patient wears seatbelts: yes. The patient participates in regular exercise: no. Has the patient ever been transfused or tattooed?: no. The patient reports that there is not domestic violence in her life.   Menstrual History: OB History   Grav Para Term Preterm Abortions TAB SAB Ect Mult Living   3 3 2 1  0 0 0 0 0 3      Menarche age: 60  Patient's last menstrual period was 12/07/2011.    The following portions of the patient's history were reviewed and updated as appropriate: allergies, current medications, past family history, past medical history, past social history, past surgical history and problem list.  Review of Systems A comprehensive review of systems was negative. Her mother died of pancreas cancer 5 weeks ago.   Objective:    BP 119/81  Pulse 122  Ht 5\' 3"  (1.6 m)  Wt 187 lb 3.2 oz (84.913 kg)  BMI 33.17 kg/m2  LMP 12/07/2011  General Appearance:    Alert, cooperative, no distress, appears stated age  Head:    Normocephalic, without obvious abnormality, atraumatic  Eyes:    PERRL, conjunctiva/corneas clear, EOM's intact, fundi    benign, both eyes  Ears:    Normal TM's and external ear canals, both ears  Nose:   Nares normal, septum midline, mucosa normal, no drainage    or sinus tenderness  Throat:   Lips, mucosa, and tongue normal; teeth and gums normal  Neck:   Supple, symmetrical, trachea midline, no adenopathy;    thyroid:  no enlargement/tenderness/nodules; no carotid   bruit or JVD  Back:     Symmetric, no curvature, ROM normal, no CVA tenderness  Lungs:     Clear to auscultation bilaterally, respirations unlabored  Chest Wall:    No tenderness or deformity   Heart:    Regular rate and rhythm, S1 and S2 normal, no murmur, rub   or gallop  Breast Exam:    No  tenderness, masses, or nipple abnormality  Abdomen:     Soft, non-tender, bowel sounds active all four quadrants,    no masses, no organomegaly  Genitalia:    Normal female without lesion, discharge or tenderness, no masses or tenderness with bimanual exam      Extremities:   Extremities normal, atraumatic, no cyanosis or edema  Pulses:   2+ and symmetric all extremities  Skin:   Skin color, texture, turgor normal, no rashes or lesions  Lymph nodes:   Cervical, supraclavicular, and axillary nodes normal  Neurologic:   CNII-XII intact, normal strength, sensation and reflexes    throughout  .    Assessment:    Healthy female exam.    Plan:     Breast self exam technique reviewed and patient encouraged to perform self-exam monthly. Mammogram.

## 2014-03-06 ENCOUNTER — Telehealth: Payer: Self-pay

## 2014-03-06 NOTE — Telephone Encounter (Signed)
PA has been submitted through covermymeds and awaiting response

## 2014-03-07 ENCOUNTER — Other Ambulatory Visit: Payer: Self-pay | Admitting: *Deleted

## 2014-03-07 ENCOUNTER — Telehealth: Payer: Self-pay | Admitting: *Deleted

## 2014-03-07 MED ORDER — ALPRAZOLAM 0.25 MG PO TABS: ORAL_TABLET | ORAL | Status: DC

## 2014-03-07 NOTE — Telephone Encounter (Signed)
Attempted to contact pt. Per covermymeds, pts Rx for Lorrin Mais has been denied.

## 2014-03-07 NOTE — Telephone Encounter (Signed)
Pt is requesting medication refill. Last f/u appt 01/2014 with no future appts scheduled. pls advise

## 2014-03-08 NOTE — Telephone Encounter (Signed)
Lm on pts vm informing her Rx has been called in to requested pharmacy 

## 2014-03-09 MED ORDER — ZOLPIDEM TARTRATE 10 MG PO TABS: 10.0000 mg | ORAL_TABLET | Freq: Every evening | ORAL | Status: DC | PRN

## 2014-03-09 NOTE — Telephone Encounter (Signed)
Spoke to pt and advised her that i had re-submitted the PA for [place]. Response was given indicating that the pt was able to have zolpidem tartrate without a PA. New Rx called in to requested pharmacy

## 2014-03-09 NOTE — Addendum Note (Signed)
Addended by: Modena Nunnery on: 03/09/2014 08:31 AM   Modules accepted: Orders

## 2014-03-16 ENCOUNTER — Other Ambulatory Visit: Payer: Self-pay | Admitting: Family Medicine

## 2014-05-01 ENCOUNTER — Ambulatory Visit: Payer: Self-pay | Admitting: Podiatry

## 2014-05-02 ENCOUNTER — Telehealth: Payer: Self-pay

## 2014-05-02 MED ORDER — ALBUTEROL SULFATE HFA 108 (90 BASE) MCG/ACT IN AERS: 2.0000 | INHALATION_SPRAY | RESPIRATORY_TRACT | Status: DC | PRN

## 2014-05-02 NOTE — Telephone Encounter (Signed)
Amy with [facility] said pts husband at pharmacy now to pick up albuterol/proair inhaler; pt is completely out of inhaler; Amy not sure if pt having a lot of SOB or what symptoms are. Refill x 1 and if symptoms continue or worsen pt needs to call for appt. Amy voiced understanding and will notify pt husband. Please advise.

## 2014-05-04 ENCOUNTER — Ambulatory Visit (INDEPENDENT_AMBULATORY_CARE_PROVIDER_SITE_OTHER): Payer: BC Managed Care – PPO

## 2014-05-04 ENCOUNTER — Encounter: Payer: Self-pay | Admitting: Podiatry

## 2014-05-04 ENCOUNTER — Ambulatory Visit (INDEPENDENT_AMBULATORY_CARE_PROVIDER_SITE_OTHER): Payer: BC Managed Care – PPO | Admitting: Podiatry

## 2014-05-04 VITALS — BP 166/113 | HR 86 | Resp 16 | Wt 190.0 lb

## 2014-05-04 DIAGNOSIS — M722 Plantar fascial fibromatosis: Secondary | ICD-10-CM

## 2014-05-04 MED ORDER — METHYLPREDNISOLONE (PAK) 4 MG PO TABS: ORAL_TABLET | ORAL | Status: DC

## 2014-05-04 MED ORDER — MELOXICAM 15 MG PO TABS: 15.0000 mg | ORAL_TABLET | Freq: Every day | ORAL | Status: DC

## 2014-05-04 NOTE — Progress Notes (Signed)
She presents today states that she has pain to her right heel. She also has pain right in here she points to the posterior tibial tendon of the right foot were she is previously had surgery. She's also had surgery to the first metatarsophalangeal joint. She relates it is been hurting him for quite some time.  Objective: Vital signs are stable she is alert and oriented x3 mildly hypertensive today secondary to her white coat syndrome. I have reviewed her past medical history medications allergies surgeries and social history. Pulses are strongly palpable bilateral. Neurologic sensorium is intact per Semmes-Weinstein monofilament. Deep tendon reflexes are intact bilateral. Muscle strength is 5 over 5 dorsiflexors plantar flexors inverters everters all intrinsic musculature is intact. Orthopedic evaluation demonstrates pain on palpation with edema overlying the posterior tibial tendon and she also has pain on palpation medial continued tubercle of the right heel. Radiographic evaluation does demonstrate soft tissue increase in density plantar fascial calcaneal insertion site.  Assessment: Posterior tibial tendinitis with plantar fasciitis right.  Plan: Injected her plantar fascial today with Kenalog and local anesthetic. Placed her in a night splint. Wrote her prescription for Medrol Dosepak to be followed by meloxicam. She will also start applying ice to the foot and wear tennis shoes more frequently.

## 2014-05-05 NOTE — Telephone Encounter (Signed)
Patient Information:  Caller Name: Syndi  Phone: [phone]  Patient: Melissa Allen, Melissa Allen  Gender: Female  DOB: 30-Nov-1965  Age: 48 Years  PCP: Arnette Norris [facility])  Pregnant: No  Office Follow Up:  Does the office need to follow up with this patient?: No  Instructions For The Office: N/A  RN Note:  Patient is calling regarding elevation in BP.  She was seen at [facility] office on 05/04/14 and BP was up.  Diagnosed with Plantar Fasciitis and given prednisone injection and PO med. Today blood pressure is 160/95.  Denies SOB, chest pain, blurred vision or change in gait.  Appointment is scheduled for 05/10/14 with Dr. Deborra Medina.  Symptoms  Reason For Call & Symptoms: HTN follow up  Reviewed Health History In EMR: Yes  Reviewed Medications In EMR: Yes  Reviewed Allergies In EMR: Yes  Reviewed Surgeries / Procedures: Yes  Date of Onset of Symptoms: 05/04/2014 OB / GYN:  LMP: Unknown  Guideline(s) Used:  High Blood Pressure  Disposition Per Guideline:   See Within 2 Weeks in Office  Reason For Disposition Reached:   BP > 160/100  Advice Given:  Lifestyle Changes  Eat a diet high in fresh fruits and low-fat dairy products. Limit your intake of saturated and total fat. Choose foods that are lower in salt.  Call Back If:  Headache, blurred vision, difficulty talking, or difficulty walking occurs  Chest pain or difficulty breathing occurs  You want to go in to the office for a blood pressure check  You become worse.  Patient Will Follow Care Advice:  YES

## 2014-05-05 NOTE — Telephone Encounter (Signed)
Agreed- refill times one - if sob or other symptoms let us know

## 2014-05-08 ENCOUNTER — Other Ambulatory Visit: Payer: Self-pay | Admitting: Family Medicine

## 2014-05-09 NOTE — Telephone Encounter (Signed)
Rx called in to requested pharmacy 

## 2014-05-09 NOTE — Telephone Encounter (Signed)
Pt requesting medication refill.Last f/u appt 01/2014 with upcoming 05/2014 appt. pls advise

## 2014-05-10 ENCOUNTER — Ambulatory Visit (INDEPENDENT_AMBULATORY_CARE_PROVIDER_SITE_OTHER): Payer: BC Managed Care – PPO | Admitting: Family Medicine

## 2014-05-10 VITALS — BP 148/78 | HR 112 | Temp 98.2°F | Wt 191.5 lb

## 2014-05-10 DIAGNOSIS — G4700 Insomnia, unspecified: Secondary | ICD-10-CM

## 2014-05-10 DIAGNOSIS — F4321 Adjustment disorder with depressed mood: Secondary | ICD-10-CM

## 2014-05-10 DIAGNOSIS — I10 Essential (primary) hypertension: Secondary | ICD-10-CM

## 2014-05-10 MED ORDER — ALBUTEROL SULFATE HFA 108 (90 BASE) MCG/ACT IN AERS: 2.0000 | INHALATION_SPRAY | RESPIRATORY_TRACT | Status: DC | PRN

## 2014-05-10 MED ORDER — FLUTICASONE PROPIONATE 50 MCG/ACT NA SUSP: 2.0000 | Freq: Every day | NASAL | Status: DC | PRN

## 2014-05-10 NOTE — Progress Notes (Signed)
Subjective:   Patient ID: Melissa Allen, female    DOB: 07/16/1966, 48 y.o.   MRN: [id number]  Melissa Allen is a pleasant 48 y.o. year old female who presents to clinic today with Follow-up  on 05/10/2014  HPI:  Has plantar fascitis- went to see Dr. Milinda Pointer.   He was concerned about her blood pressure. BP Readings from Last 3 Encounters:  05/10/14 148/78  05/04/14 166/113  03/03/14 119/81    HTN-  On Maxzide 37.5-25 mg and Cozaar 50 mg daily.  Did feel "hot and flushed" when her BP was elevated at Dr. Stephenie Acres office.  She was also in a lot of pain.  On prednisone and under significant stressors- mom just died and school just started back.  She is in grief counseling through hospice- a lot of "family issues" she is working through.  Not sleeping well but she thinks that is from the prednisone, not grief.  Taking Ambien nightly. Denies SI or HI.  Current Outpatient Prescriptions on File Prior to Visit  Medication Sig Dispense Refill  . ALPRAZolam (XANAX) 0.25 MG tablet TAKE 1-2 TABLETS BY MOUTH DAILY AS NEEDED FOR ANXIETY  90 tablet  0  . chlorpheniramine (CHLOR-TRIMETON) 4 MG tablet Take 4 mg by mouth 2 (two) times daily as needed for allergies.      . DULoxetine (CYMBALTA) 60 MG capsule TAKE ONE CAPSULE BY MOUTH NIGHTLY AT BEDTIME  90 capsule  3  . furosemide (LASIX) 40 MG tablet Take 40 mg by mouth daily as needed for edema.      Marland Kitchen ipratropium-albuterol (DUONEB) 0.5-2.5 (3) MG/3ML SOLN Take 3 mLs by nebulization every 4 (four) hours as needed.  360 mL  1  . losartan (COZAAR) 50 MG tablet TAKE 1 TABLET BY MOUTH DAILY  90 tablet  0  . meloxicam (MOBIC) 15 MG tablet Take 1 tablet (15 mg total) by mouth daily.  30 tablet  2  . methylPREDNIsolone (MEDROL DOSPACK) 4 MG tablet 6 day tapering dose-follow package instructions  21 tablet  0  . montelukast (SINGULAIR) 10 MG tablet Take one tablet by mouth   nightly at bedtime  30 tablet  4  . omeprazole (PRILOSEC) 40 MG capsule Take 1  capsule (40 mg total) by mouth daily.  30 capsule  3  . SYMBICORT 160-4.5 MCG/ACT inhaler INHALE TWO PUFFS BY MOUTH TWICE DAILY   10.2 g  6  . triamterene-hydrochlorothiazide (MAXZIDE-25) 37.5-25 MG per tablet TAKE 1 TABLET BY MOUTH DAILY  90 tablet  0  . zolpidem (AMBIEN) 10 MG tablet Take 10 mg by mouth at bedtime.       No current facility-administered medications on file prior to visit.    Allergies  Allergen Reactions  . Codeine     REACTION: vomiting  . Hydrocodone Nausea And Vomiting  . Latex Hives  . Lisinopril     Cough     Past Medical History  Diagnosis Date  . Allergy   . Calcaneal fracture   . Obesity (BMI 30.0-34.9)   . Asthma     recent admit to ER 01/11/12 for exac of asthma  . Hypertension   . Fibromyalgia   . Anginal pain     one episode 4-5 years ago-[facility] [place]-nonspecific-no problems since-  . PONV (postoperative nausea and vomiting)   . Kidney stones   . Duplicated ureter, right   . Poor venous access     hard to start iv-  . Wears glasses  Past Surgical History  Procedure Laterality Date  . Tonsillectomy  age 36  . Bunionectomy  1995    Right  . Cholecystectomy  late 1980's       . Lumbar laminectomy  10/06    L4-5, Ray  . Knee surgery      x 2, Murphy/Sue  . Lithotripsy      Multiple  . R compressed pronator      Sypher  . Tendon repair  2013  . Tendon repair  02/01/12  . Bladder suspension  03/01/2012    Procedure: TRANSVAGINAL TAPE (TVT) PROCEDURE;  Surgeon: Emily Filbert, MD;  Location: [facility];  Service: Gynecology;  Laterality: N/A;  . Cystoscopy  03/01/2012    Procedure: CYSTOSCOPY;  Surgeon: Emily Filbert, MD;  Location: [facility];  Service: Gynecology;  Laterality: N/A;  . Abdominal hysterectomy    . Incision and drainage abscess Left 07/19/2013    Procedure: INCISION AND DRAINAGE LEFT LOWER EXTERMITY HEMATOMA;  Surgeon: Imogene Burn. Georgette Dover, MD;  Location: [facility];  Service: General;  Laterality: Left;   Excision left lower leg mass    Family History  Problem Relation Age of Onset  . Heart disease Father   . Colon polyps Father   . Colon cancer Paternal Grandfather   . Colon cancer Maternal Grandmother   . Uterine cancer      Grandmother  . Breast cancer      Grandmother  . Ovarian cancer      Grandmother  . Diabetes      mother  . Other Neg Hx   . Cancer Mother     pancreatic cancer    History   Social History  . Marital Status: Married    Spouse Name: N/A    Number of Children: 3  . Years of Education: N/A   Occupational History  . 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 Smoking status: Never Smoker   . Smokeless tobacco: Never Used  . Alcohol Use: 3.5 oz/week    7 drink(s) per week     Comment: Occasional  . Drug Use: No  . Sexual Activity: Yes    Birth Control/ Protection: Surgical   Other Topics Concern  . 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    Review of Systems See HPI No HA No blurred vision No CP  No SOB    Objective:    BP 148/78  Pulse 112  Temp(Src) 98.2 F (36.8 C) (Oral)  Wt 191 lb 8 oz (86.864 kg)  SpO2 96%  LMP 12/07/2011   Physical Exam  Nursing note and vitals reviewed. Constitutional: She is oriented to person, place, and time. She appears well-developed and well-nourished. No distress.  HENT:  Head: Normocephalic.  Cardiovascular: Normal rate.   Musculoskeletal: Normal range of motion.  Neurological: She is alert and oriented to person, place, and time.  Skin: Skin is warm and dry.  Psychiatric: She has a normal mood and affect. Her behavior is normal. Judgment and thought content  normal.  Assessment & Plan:   HYPERTENSION  Grief  Insomnia No Follow-up on file.

## 2014-05-10 NOTE — Progress Notes (Signed)
Pre visit review using our clinic review tool, if applicable. No additional management support is needed unless otherwise documented below in the visit note. 

## 2014-05-10 NOTE — Assessment & Plan Note (Addendum)
Deteriorated at Dr. Stephenie Acres office but I do think this is likely due to stressors, pain and prednisone. >25 minutes spent in face to face time with patient, >50% spent in counselling or coordination of care  BP much better here- still slightly elevated. Advised getting home cuff, like OMRON. Take a xanax when she feels "flushed" to see if helps with her BP. The patient indicates understanding of these issues and agrees with the plan. Call or return to clinic prn if these symptoms worsen or fail to improve as anticipated.

## 2014-05-19 ENCOUNTER — Other Ambulatory Visit: Payer: Self-pay | Admitting: Family Medicine

## 2014-05-19 NOTE — Telephone Encounter (Signed)
Ok to fill per Dr Aron. Rx faxed to requested pharmacy 

## 2014-06-01 ENCOUNTER — Ambulatory Visit: Payer: BC Managed Care – PPO | Admitting: Podiatry

## 2014-06-03 ENCOUNTER — Other Ambulatory Visit: Payer: Self-pay | Admitting: Family Medicine

## 2014-06-05 ENCOUNTER — Other Ambulatory Visit: Payer: Self-pay

## 2014-06-05 MED ORDER — ALPRAZOLAM 0.25 MG PO TABS: ORAL_TABLET | ORAL | Status: DC

## 2014-06-05 NOTE — Telephone Encounter (Signed)
[place] with [facility] left v/m; [facility] got message that pt received alprazolam # 90 on 05/09/14. [place] pulled hard copy and alprazolam was called to [facility] for # 60 on 05/09/14. [place] request cb.

## 2014-06-05 NOTE — Telephone Encounter (Signed)
Rx called in to requested pharmacy 

## 2014-06-16 ENCOUNTER — Other Ambulatory Visit: Payer: Self-pay | Admitting: Family Medicine

## 2014-06-19 NOTE — Telephone Encounter (Signed)
Pt requesting medication refill. Last f/u appt 05/2014. Ok to fill per Dr Deborra Medina. Rx to be faxed to requested pharmacy before end of day, today.

## 2014-06-23 ENCOUNTER — Other Ambulatory Visit: Payer: Self-pay | Admitting: Family Medicine

## 2014-07-10 ENCOUNTER — Encounter: Payer: Self-pay | Admitting: Podiatry

## 2014-07-10 ENCOUNTER — Telehealth: Payer: Self-pay | Admitting: Family Medicine

## 2014-07-10 DIAGNOSIS — M545 Low back pain, unspecified: Secondary | ICD-10-CM

## 2014-07-10 NOTE — Telephone Encounter (Signed)
Pt calling in and wants you to schedule her for an MRI fr her back. Pt has an appt to see Neurosurgeon Dr Trenton Gammon on 07/19/2014 for back issues, sciatica, left leg is weak and numb. Please advise. Thank you

## 2014-07-11 NOTE — Telephone Encounter (Signed)
Order entered

## 2014-07-12 ENCOUNTER — Telehealth: Payer: Self-pay | Admitting: Family Medicine

## 2014-07-12 NOTE — Telephone Encounter (Signed)
Order cancelled

## 2014-07-12 NOTE — Telephone Encounter (Signed)
Called Dr Irven [place] office where patient has an appt on 07/19/14. They said she had back surgery by them in 2006 and that they can order the MRI after they see her for the appt on 07/19/14. I told them we haven't seen her for her back pain at all and they said it wasn't a problem to see her without the MRI. Called the patient and left her a VM regarding what Dr Irven [place] office has said. Please cancel your order in Epic.

## 2014-07-15 ENCOUNTER — Other Ambulatory Visit: Payer: Self-pay | Admitting: Family Medicine

## 2014-07-17 NOTE — Telephone Encounter (Signed)
Pt requesting medication refill. Last OV 05/2014. Ok to fill per Dr Deborra Medina. Rx to be faxed to requested pharmacy by end of day, today.

## 2014-07-18 ENCOUNTER — Other Ambulatory Visit: Payer: Self-pay | Admitting: Family Medicine

## 2014-08-07 ENCOUNTER — Other Ambulatory Visit: Payer: Self-pay | Admitting: Family Medicine

## 2014-08-07 NOTE — Telephone Encounter (Signed)
Pt requesting medication refill. Last f/u appt 05/2014. pls advise 

## 2014-08-07 NOTE — Telephone Encounter (Signed)
Rx called in to requested pharmacy 

## 2014-08-14 ENCOUNTER — Other Ambulatory Visit: Payer: Self-pay | Admitting: Family Medicine

## 2014-08-15 NOTE — Telephone Encounter (Signed)
Pt requesting medication refill. Last f/u appt 01/2014. pls advise 

## 2014-08-15 NOTE — Telephone Encounter (Signed)
Rx called in to requested pharmacy 

## 2014-09-13 ENCOUNTER — Other Ambulatory Visit: Payer: Self-pay | Admitting: Family Medicine

## 2014-09-13 NOTE — Telephone Encounter (Signed)
Pt requesting medication refill. Pt has not had recent follow up visit. pls advise

## 2014-09-13 NOTE — Telephone Encounter (Signed)
Rx called in to requested pharmacy 

## 2014-09-26 ENCOUNTER — Ambulatory Visit (INDEPENDENT_AMBULATORY_CARE_PROVIDER_SITE_OTHER): Payer: BC Managed Care – PPO | Admitting: Internal Medicine

## 2014-09-26 ENCOUNTER — Encounter: Payer: Self-pay | Admitting: Internal Medicine

## 2014-09-26 VITALS — BP 128/80 | HR 120 | Temp 98.2°F | Wt 195.0 lb

## 2014-09-26 DIAGNOSIS — J4520 Mild intermittent asthma, uncomplicated: Secondary | ICD-10-CM

## 2014-09-26 DIAGNOSIS — J020 Streptococcal pharyngitis: Secondary | ICD-10-CM

## 2014-09-26 DIAGNOSIS — J069 Acute upper respiratory infection, unspecified: Secondary | ICD-10-CM | POA: Insufficient documentation

## 2014-09-26 LAB — POCT RAPID STREP A (OFFICE): Rapid Strep A Screen: NEGATIVE

## 2014-09-26 NOTE — Progress Notes (Signed)
Pre visit review using our clinic review tool, if applicable. No additional management support is needed unless otherwise documented below in the visit note. 

## 2014-09-26 NOTE — Patient Instructions (Signed)
Use over-the-counter  "cold" medicines  such as "Tylenol cold" , "Advil cold",  "Mucinex" or" Mucinex D"  for cough and congestion.   Avoid decongestants if you have high blood pressure and use "Afrin" nasal spray for nasal congestion as directed instead. Use" Delsym" or" Robitussin" cough syrup varietis for cough.  You can use plain "Tylenol" or "Advil" for fever, chills and achyness.   "Common cold" symptoms are usually triggered by a virus.  The antibiotics are usually not necessary. On average, a" viral cold" illness would take 4-7 days to resolve. Please, make an appointment if you are not better or if you're worse.   

## 2014-09-26 NOTE — Assessment & Plan Note (Signed)
Continue with current prescription therapy as reflected on the Med list.  

## 2014-09-26 NOTE — Assessment & Plan Note (Signed)
Strep test See meds

## 2014-09-26 NOTE — Progress Notes (Signed)
   Subjective:    Patient ID: Melissa Allen, female    DOB: September 14, 1965, 49 y.o.   MRN: [id number]  URI  This is a new problem. The current episode started yesterday. The problem has been gradually worsening. There has been no fever. Associated symptoms include chest pain, coughing, headaches, rhinorrhea, sinus pain, a sore throat and swollen glands. Pertinent negatives include no abdominal pain or rash. She has tried antihistamine and acetaminophen for the symptoms. The treatment provided no relief.    C/o URI sx's  Review of Systems  HENT: Positive for rhinorrhea and sore throat.   Respiratory: Positive for cough.   Cardiovascular: Positive for chest pain.  Gastrointestinal: Negative for abdominal pain.  Skin: Negative for rash.  Neurological: Positive for headaches.       Objective:   Physical Exam  Constitutional: She appears well-developed. No distress.  HENT:  Head: Normocephalic.  Right Ear: External ear normal.  Left Ear: External ear normal.  Mouth/Throat: Oropharynx is clear and moist. No oropharyngeal exudate.  Eyes: Conjunctivae are normal. Pupils are equal, round, and reactive to light. Right eye exhibits no discharge. Left eye exhibits no discharge.  Neck: Normal range of motion. Neck supple. No JVD present. No tracheal deviation present. No thyromegaly present.  Cardiovascular: Normal rate, regular rhythm and normal heart sounds.   Pulmonary/Chest: No stridor. No respiratory distress. She has no wheezes.  Abdominal: Soft. Bowel sounds are normal. She exhibits no distension and no mass. There is no tenderness. There is no rebound and no guarding.  Musculoskeletal: She exhibits no edema or tenderness.  Lymphadenopathy:    She has no cervical adenopathy.  Neurological: She displays normal reflexes. No cranial nerve deficit. She exhibits normal muscle tone. Coordination normal.  Skin: No rash noted. No erythema.  Psychiatric: She has a normal mood and affect. Her  behavior is normal. Judgment and thought content normal.  Submand LNs enlarged Eryth throat      Assessment & Plan:

## 2014-09-26 NOTE — Addendum Note (Signed)
Addended by: Cresenciano Lick on: 09/26/2014 11:36 AM   Modules accepted: Orders

## 2014-10-02 ENCOUNTER — Other Ambulatory Visit: Payer: Self-pay | Admitting: Family Medicine

## 2014-10-02 NOTE — Telephone Encounter (Signed)
Received refill request electronically from pharmacy. See allergy/contraindication Cozaar. Is it okay to refill medication?

## 2014-10-09 ENCOUNTER — Other Ambulatory Visit: Payer: Self-pay | Admitting: Family Medicine

## 2014-10-11 NOTE — Telephone Encounter (Signed)
Pt has not had recent f/u visit. pls advise

## 2014-10-11 NOTE — Telephone Encounter (Signed)
Rx called in to requested pharmacy 

## 2014-10-25 ENCOUNTER — Telehealth: Payer: Self-pay | Admitting: Family Medicine

## 2014-10-25 ENCOUNTER — Ambulatory Visit (INDEPENDENT_AMBULATORY_CARE_PROVIDER_SITE_OTHER): Payer: BC Managed Care – PPO | Admitting: Family Medicine

## 2014-10-25 ENCOUNTER — Encounter: Payer: Self-pay | Admitting: Family Medicine

## 2014-10-25 VITALS — BP 138/90 | HR 108 | Temp 98.4°F | Ht 63.0 in | Wt 199.0 lb

## 2014-10-25 DIAGNOSIS — I10 Essential (primary) hypertension: Secondary | ICD-10-CM

## 2014-10-25 MED ORDER — LOSARTAN POTASSIUM 100 MG PO TABS: 100.0000 mg | ORAL_TABLET | Freq: Every day | ORAL | Status: DC

## 2014-10-25 NOTE — Telephone Encounter (Signed)
I saw her today -bp was down at her visit

## 2014-10-25 NOTE — Patient Instructions (Signed)
Increase losartan to 100 mg  bp is improved but still a little high  If your bp goes too low (90s/50s) - or dizzy -please cut it back and let me know  Follow up with Dr Deborra Medina in 2-3 weeks - alert [place] if problems before then

## 2014-10-25 NOTE — Telephone Encounter (Signed)
Patient Name: Melissa Allen DOB: [birth year] Initial Comment caller states her bp is 160/110 - with medication - she feels weird . She has an appt at 12:30 today Nurse Assessment Nurse: Vallery Sa, RN, [place] Date/Time (Eastern Time): 10/25/2014 10:57:46 AM Confirm and document reason for call. If symptomatic, describe symptoms. ---Caller states her blood pressure was 160/100 about 45 minutes ago. No severe breathing difficulty. No injury in the past 3 days. She has taken her high blood pressure medication the way the MDs advised. Has the patient traveled out of the country within the last 30 days? ---No Does the patient require triage? ---Yes Related visit to physician within the last 2 weeks? ---No Does the PT have any chronic conditions? (i.e. diabetes, asthma, etc.) ---Yes List chronic conditions. ---Fibromylagia, High Blood Pressure, Back problems, Asthma (no wheezing) Did the patient indicate they were pregnant? ---No Guidelines Guideline Title Affirmed Question Affirmed Notes High Blood Pressure [1] BP # 160 / 100 AND [2] cardiac or neurologic symptoms (e.g., chest pain, difficulty breathing, unsteady gait, blurred vision) dizzy, unsteady, feels like she is floating Final Disposition User Go to ED Now Vallery Sa, RN, [employer] declined the Go to ER disposition. She states that she has a 12:30pm appointment and plans to see her MD then. Encouraged to call back as needed. Called the office backline and notified that Morey Hummingbird that Robynne declined the Go to ER disposition and plans to keep the 12:30pm appointment.

## 2014-10-25 NOTE — Progress Notes (Signed)
Subjective:    Patient ID: Melissa Allen, female    DOB: 04-Sep-1966, 49 y.o.   MRN: [id number]  HPI Here for elevated bp   Was 160/110 this am and she did not feel good  Felt dizzy/headache/floaty feeling  Not anxious /nervous about anything at the time  Had taken her med this am   Nurse checked it at school [employer]) with a reliable cuff   Feels better than she did this am   BP Readings from Last 3 Encounters:  10/25/14 146/80  09/26/14 128/80  05/10/14 148/78    Chronic pain with hx of ruptured disc   Some stress - mother died last year with pancreatic cancer-getting through grief   Patient Active Problem List   Diagnosis Date Noted  . Acute URI 09/26/2014  . Grief 01/26/2014  . Insomnia 09/30/2013  . Chest pain with low risk of acute coronary syndrome 09/18/2013  . Traumatic hematoma of left lower leg 07/18/2013  . Left leg swelling 06/16/2013  . Left leg pain 06/16/2013  . Edema 05/29/2013  . Bilateral knee pain 05/17/2013  . Urine frequency 12/22/2012  . Dizziness 12/22/2012  . Chest wall pain 09/28/2012  . HEADACHE 04/26/2010  . FIBROMYALGIA 01/25/2010  . ANXIETY 01/22/2010  . DEPRESSION 01/22/2010  . REFLUX GASTRITIS 01/22/2010  . HEPATIC CYST 01/22/2010  . NEPHROLITHIASIS, HX OF 01/22/2010  . HYPERTENSION 01/01/2009  . PALPITATIONS 01/11/2008  . CONTACT DERMATITIS&OTHER ECZEMA DUE TO PLANTS 12/27/2007  . Asthma 11/26/2007  . GERD 11/26/2007  . PANCREATITIS, CHRONIC 11/26/2007  . RENAL CALCULUS 11/26/2007  . ARTHRITIS 11/26/2007  . ALLERGIC RHINITIS 11/24/2007   Past Medical History  Diagnosis Date  . Allergy   . Calcaneal fracture   . Obesity (BMI 30.0-34.9)   . Asthma     recent admit to ER 01/11/12 for exac of asthma  . Hypertension   . Fibromyalgia   . Anginal pain     one episode 4-5 years ago-[facility]-nonspecific-no problems since-  . PONV (postoperative nausea and vomiting)   . Kidney stones   . Duplicated ureter,  right   . Poor venous access     hard to start iv-  . Wears glasses    Past Surgical History  Procedure Laterality Date  . Tonsillectomy  age 52  . Bunionectomy  1995    Right  . Cholecystectomy  late 1980's       . Lumbar laminectomy  10/06    L4-5, Ray  . Knee surgery      x 2, Murphy/Sue  . Lithotripsy      Multiple  . R compressed pronator      Sypher  . Tendon repair  2013  . Tendon repair  02/01/12  . Bladder suspension  03/01/2012    Procedure: TRANSVAGINAL TAPE (TVT) PROCEDURE;  Surgeon: Emily Filbert, MD;  Location: [facility];  Service: Gynecology;  Laterality: N/A;  . Cystoscopy  03/01/2012    Procedure: CYSTOSCOPY;  Surgeon: Emily Filbert, MD;  Location: [facility];  Service: Gynecology;  Laterality: N/A;  . Abdominal hysterectomy    . Incision and drainage abscess Left 07/19/2013    Procedure: INCISION AND DRAINAGE LEFT LOWER EXTERMITY HEMATOMA;  Surgeon: Imogene Burn. Georgette Dover, MD;  Location: [facility];  Service: General;  Laterality: Left;  Excision left lower leg mass   History  Substance Use Topics  . Smoking status: Never Smoker   . Smokeless tobacco: Never Used  . Alcohol  Use: 3.5 oz/week    7 drink(s) per week     Comment: Occasional   Family History  Problem Relation Age of Onset  . Heart disease Father   . Colon polyps Father   . Colon cancer Paternal Grandfather   . Colon cancer Maternal Grandmother   . Uterine cancer      Grandmother  . Breast cancer      Grandmother  . Ovarian cancer      Grandmother  . Diabetes      mother  . Other Neg Hx   . Cancer Mother     pancreatic cancer   Allergies  Allergen Reactions  . Codeine     REACTION: vomiting  . Hydrocodone Nausea And Vomiting  . Latex Hives  . Lisinopril     Cough    Current Outpatient Prescriptions on File Prior to Visit  Medication Sig Dispense Refill  . ALPRAZolam (XANAX) 0.25 MG tablet TAKE 1 TO 2 TABLETS BY MOUTH EVERY DAY AS NEEDED AS DIRECTED. 60 tablet 0  .  chlorpheniramine (CHLOR-TRIMETON) 4 MG tablet Take 4 mg by mouth 2 (two) times daily as needed for allergies.    . DULoxetine (CYMBALTA) 60 MG capsule TAKE ONE CAPSULE BY MOUTH NIGHTLY AT BEDTIME 90 capsule 3  . fluticasone (FLONASE) 50 MCG/ACT nasal spray USE 2 SPRAYS IN EACH NOSTRIL ONCE DAILY AS NEEDED. FOR ALLERGIES 16 g 5  . ipratropium-albuterol (DUONEB) 0.5-2.5 (3) MG/3ML SOLN Take 3 mLs by nebulization every 4 (four) hours as needed. 360 mL 1  . losartan (COZAAR) 50 MG tablet TAKE 1 TABLET BY MOUTH DAILY 90 tablet 1  . meloxicam (MOBIC) 15 MG tablet Take 1 tablet (15 mg total) by mouth daily. 30 tablet 2  . PROAIR HFA 108 (90 BASE) MCG/ACT inhaler INHALE TWO PUFFS EVERY FOUR HOURS AS NEEDED SHORTNESS OF BREATH 8.5 g 2  . SYMBICORT 160-4.5 MCG/ACT inhaler INHALE 2 PUFFS EVERY 12 HOURS TO PREVENTCOUGH OR WHEEZING--RINSE, GARGLE, & SPITAFTER EACH USE. 10.2 g 3  . triamterene-hydrochlorothiazide (MAXZIDE-25) 37.5-25 MG per tablet TAKE 1 TABLET BY MOUTH DAILY 90 tablet 1  . zolpidem (AMBIEN) 10 MG tablet TAKE ONE TABLET BY MOUTH EVERY NIGHT AT BEDTIME 30 tablet 0  . furosemide (LASIX) 40 MG tablet Take 40 mg by mouth daily as needed for edema.    . methylPREDNIsolone (MEDROL DOSPACK) 4 MG tablet 6 day tapering dose-follow package instructions (Patient not taking: Reported on 09/26/2014) 21 tablet 0   No current facility-administered medications on file prior to visit.    Review of Systems    Review of Systems  Constitutional: Negative for fever, appetite change, fatigue and unexpected weight change.  Eyes: Negative for pain and visual disturbance.  Respiratory: Negative for cough and shortness of breath.   Cardiovascular: Negative for cp or palpitations    Gastrointestinal: Negative for nausea, diarrhea and constipation.  Genitourinary: Negative for urgency and frequency.  Skin: Negative for pallor or rash   Neurological: Negative for weakness, numbness and pos for  headaches.    Hematological: Negative for adenopathy. Does not bruise/bleed easily.  Psychiatric/Behavioral: Negative for dysphoric mood. The patient is not nervous/anxious.      Objective:   Physical Exam  Constitutional: She appears well-developed and well-nourished. No distress.  obese and well appearing   HENT:  Head: Normocephalic and atraumatic.  Mouth/Throat: Oropharynx is clear and moist.  Eyes: Conjunctivae and EOM are normal. Pupils are equal, round, and reactive to light. No scleral icterus.  Neck: Normal range of motion. Neck supple. No JVD present. Carotid bruit is not present. No thyromegaly present.  Cardiovascular: Normal rate, regular rhythm, normal heart sounds and intact distal pulses.  Exam reveals no gallop.   Pulmonary/Chest: Effort normal and breath sounds normal. No respiratory distress. She has no wheezes. She has no rales.  Abdominal: Soft. Bowel sounds are normal. She exhibits no distension, no abdominal bruit and no mass. There is no tenderness.  Genitourinary: Vaginal discharge found.  Musculoskeletal: She exhibits no edema.  Lymphadenopathy:    She has no cervical adenopathy.  Neurological: She is alert. She has normal reflexes. No cranial nerve deficit. She exhibits normal muscle tone. Coordination normal.  Skin: Skin is warm and dry. No rash noted. No erythema. No pallor.  Psychiatric: She has a normal mood and affect.          Assessment & Plan:   Problem List Items Addressed This Visit      Cardiovascular and Mediastinum   Essential hypertension - Primary    Reviewed recent and past hx  Some stressors BP reading here is better from out of the office Will inc losartan to 100 mg daily-disc poss side eff incl hypotension Rev lifestyle change for HTN incl DASH diet F/u with PCP planned       Relevant Medications   losartan (COZAAR) tablet

## 2014-10-25 NOTE — Progress Notes (Signed)
Pre visit review using our clinic review tool, if applicable. No additional management support is needed unless otherwise documented below in the visit note. 

## 2014-10-26 NOTE — Assessment & Plan Note (Signed)
Reviewed recent and past hx  Some stressors BP reading here is better from out of the office Will inc losartan to 100 mg daily-disc poss side eff incl hypotension Rev lifestyle change for HTN incl DASH diet F/u with PCP planned

## 2014-10-27 ENCOUNTER — Telehealth: Payer: Self-pay | Admitting: Family Medicine

## 2014-10-27 NOTE — Telephone Encounter (Signed)
emmi emailed °

## 2014-10-30 ENCOUNTER — Other Ambulatory Visit: Payer: Self-pay | Admitting: Family Medicine

## 2014-10-30 NOTE — Telephone Encounter (Signed)
Last f/u appt 01/2014 

## 2014-10-30 NOTE — Telephone Encounter (Signed)
Rx called in to requested pharmacy 

## 2014-10-31 NOTE — Telephone Encounter (Signed)
Mr Esguerra left v/m checking on status of alprazolam refill; Mr Burpee advised done on 10/30/14 and Mr Mikrut said just notified by pharmacy rx ready for pick up.

## 2014-11-07 ENCOUNTER — Other Ambulatory Visit: Payer: Self-pay | Admitting: Family Medicine

## 2014-11-07 NOTE — Telephone Encounter (Signed)
Pt has not had recent f/u. pls advise

## 2014-11-08 NOTE — Telephone Encounter (Signed)
Rx called in to requested pharmacy 

## 2014-11-13 ENCOUNTER — Ambulatory Visit: Payer: BC Managed Care – PPO | Admitting: Family Medicine

## 2014-11-15 ENCOUNTER — Ambulatory Visit (INDEPENDENT_AMBULATORY_CARE_PROVIDER_SITE_OTHER): Payer: BC Managed Care – PPO | Admitting: Family Medicine

## 2014-11-15 ENCOUNTER — Encounter: Payer: Self-pay | Admitting: Family Medicine

## 2014-11-15 VITALS — BP 132/92 | HR 107 | Temp 98.3°F | Wt 197.0 lb

## 2014-11-15 DIAGNOSIS — I10 Essential (primary) hypertension: Secondary | ICD-10-CM

## 2014-11-15 DIAGNOSIS — Z23 Encounter for immunization: Secondary | ICD-10-CM

## 2014-11-15 DIAGNOSIS — F4321 Adjustment disorder with depressed mood: Secondary | ICD-10-CM

## 2014-11-15 NOTE — Patient Instructions (Signed)
Good to see you. Hang in there. Please continue your blood pressure medication dosage.

## 2014-11-15 NOTE — Assessment & Plan Note (Signed)
Improved control. Continue current rx. Follow up prn. The patient indicates understanding of these issues and agrees with the plan.

## 2014-11-15 NOTE — Progress Notes (Signed)
Pre visit review using our clinic review tool, if applicable. No additional management support is needed unless otherwise documented below in the visit note. 

## 2014-11-15 NOTE — Addendum Note (Signed)
Addended by: Modena Nunnery on: 11/15/2014 12:55 PM   Modules accepted: Orders

## 2014-11-15 NOTE — Progress Notes (Signed)
Subjective:   Patient ID: Melissa Allen, female    DOB: Aug 01, 1966, 49 y.o.   MRN: [id number]  Melissa Allen is a pleasant 49 y.o. year old female who presents to clinic today with Follow-up  on 11/15/2014  HPI: HTN-   Saw Dr. Glori Bickers on 2/17 for worsening blood pressure. Note reviewed.  Normotensive in office- was 160/110 at home and felt like it was elevated. Nurse at school checked it the day she came in and it was elevated then as well.  Dr. Glori Bickers doubled her losartan to 100 mg daily.  She does feel it has helped. Has had a lot of pain lately- seeing Dr. Trenton Gammon later today.  Having back surgery.  BP Readings from Last 3 Encounters:  11/15/14 132/92  10/25/14 138/90  09/26/14 128/80   Current Outpatient Prescriptions on File Prior to Visit  Medication Sig Dispense Refill  . ALPRAZolam (XANAX) 0.25 MG tablet TAKE 1 TO 2 TABLETS BY MOUTH EVERY DAY. 60 tablet 0  . chlorpheniramine (CHLOR-TRIMETON) 4 MG tablet Take 4 mg by mouth 2 (two) times daily as needed for allergies.    . DULoxetine (CYMBALTA) 60 MG capsule TAKE ONE CAPSULE BY MOUTH NIGHTLY AT BEDTIME 90 capsule 3  . fluticasone (FLONASE) 50 MCG/ACT nasal spray USE 2 SPRAYS IN EACH NOSTRIL ONCE DAILY AS NEEDED. FOR ALLERGIES 16 g 5  . ipratropium-albuterol (DUONEB) 0.5-2.5 (3) MG/3ML SOLN Take 3 mLs by nebulization every 4 (four) hours as needed. 360 mL 1  . losartan (COZAAR) 100 MG tablet Take 1 tablet (100 mg total) by mouth daily. 30 tablet 1  . PROAIR HFA 108 (90 BASE) MCG/ACT inhaler INHALE TWO PUFFS EVERY FOUR HOURS AS NEEDED SHORTNESS OF BREATH 8.5 g 2  . SYMBICORT 160-4.5 MCG/ACT inhaler INHALE 2 PUFFS EVERY 12 HOURS TO PREVENTCOUGH OR WHEEZING--RINSE, GARGLE, & SPITAFTER EACH USE. 10.2 g 3  . triamterene-hydrochlorothiazide (MAXZIDE-25) 37.5-25 MG per tablet TAKE 1 TABLET BY MOUTH DAILY 90 tablet 1  . zolpidem (AMBIEN) 10 MG tablet TAKE ONE TABLET BY MOUTH EVERY NIGHT AT BEDTIME 30 tablet 0   No current  facility-administered medications on file prior to visit.    Allergies  Allergen Reactions  . Codeine     REACTION: vomiting  . Hydrocodone Nausea And Vomiting  . Latex Hives  . Lisinopril     Cough     Past Medical History  Diagnosis Date  . Allergy   . Calcaneal fracture   . Obesity (BMI 30.0-34.9)   . Asthma     recent admit to ER 01/11/12 for exac of asthma  . Hypertension   . Fibromyalgia   . Anginal pain     one episode 4-5 years ago-[facility]-nonspecific-no problems since-  . PONV (postoperative nausea and vomiting)   . Kidney stones   . Duplicated ureter, right   . Poor venous access     hard to start iv-  . Wears glasses     Past Surgical History  Procedure Laterality Date  . Tonsillectomy  age 72  . Bunionectomy  1995    Right  . Cholecystectomy  late 1980's       . Lumbar laminectomy  10/06    L4-5, Ray  . Knee surgery      x 2, Murphy/Sue  . Lithotripsy      Multiple  . R compressed pronator      Sypher  . Tendon repair  2013  . Tendon repair  02/01/12  .  Bladder suspension  03/01/2012    Procedure: TRANSVAGINAL TAPE (TVT) PROCEDURE;  Surgeon: Emily Filbert, MD;  Location: [facility];  Service: Gynecology;  Laterality: N/A;  . Cystoscopy  03/01/2012    Procedure: CYSTOSCOPY;  Surgeon: Emily Filbert, MD;  Location: [facility];  Service: Gynecology;  Laterality: N/A;  . Abdominal hysterectomy    . Incision and drainage abscess Left 07/19/2013    Procedure: INCISION AND DRAINAGE LEFT LOWER EXTERMITY HEMATOMA;  Surgeon: Imogene Burn. Georgette Dover, MD;  Location: [facility];  Service: General;  Laterality: Left;  Excision left lower leg mass    Family History  Problem Relation Age of Onset  . Heart disease Father   . Colon polyps Father   . Colon cancer Paternal Grandfather   . Colon cancer Maternal Grandmother   . Uterine cancer      Grandmother  . Breast cancer      Grandmother  . Ovarian cancer      Grandmother  . Diabetes       mother  . Other Neg Hx   . Cancer Mother     pancreatic cancer    History   Social History  . Marital Status: Married    Spouse Name: N/A  . Number of Children: 3  . Years of Education: N/A   Occupational History  . 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 Smoking status: Never Smoker   . Smokeless tobacco: Never Used  . Alcohol Use: 3.5 oz/week    7 drink(s) per week     Comment: Occasional  . Drug Use: No  . Sexual Activity: Yes    Birth Control/ Protection: Surgical   Other Topics Concern  . 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    Review of Systems  Constitutional: Negative.   HENT: Negative.   Respiratory: Negative.   Cardiovascular: Negative.   Musculoskeletal: Positive for back pain.  Psychiatric/Behavioral: Negative.   All other systems reviewed and are negative.      Objective:    BP 132/92 mmHg  Pulse 107  Temp(Src) 98.3 F (36.8 C) (Oral)  Wt 197 lb (89.359 kg)  SpO2 96%  LMP 12/07/2011   Physical Exam   General:  Well-developed,well-nourished,in no acute distress; alert,appropriate and cooperative throughout examination Head:  normocephalic and atraumatic.   Lungs:  Normal respiratory effort, chest expands symmetrically. Lungs are clear to auscultation, no crackles or wheezes. Heart:  Normal rate and regular rhythm. S1 and S2 normal without gallop, murmur, click, rub or other extra sounds. Msk:  No deformity or scoliosis noted of thoracic or lumbar spine.   Extremities:  No clubbing, cyanosis, edema, or deformity noted with normal full range of motion of all joints.   Neurologic:  alert & oriented X3 and  gait normal.   Skin:  Intact without suspicious lesions or rashes Psych:  Cognition and judgment appear intact. Alert and cooperative with normal attention span and concentration. No apparent delusions, illusions, hallucinations       Assessment & Plan:   Essential hypertension No Follow-up on file.

## 2014-12-06 ENCOUNTER — Other Ambulatory Visit: Payer: Self-pay | Admitting: Family Medicine

## 2014-12-06 MED ORDER — ZOLPIDEM TARTRATE 10 MG PO TABS: 10.0000 mg | ORAL_TABLET | Freq: Every day | ORAL | Status: DC

## 2014-12-06 NOTE — Telephone Encounter (Signed)
Ambien called in per Dr Deborra Medina ok.

## 2014-12-06 NOTE — Addendum Note (Signed)
Addended by: Amado Coe on: 12/06/2014 02:05 PM   Modules accepted: Orders

## 2014-12-06 NOTE — Telephone Encounter (Signed)
Last f/u appt 11/2014 

## 2014-12-15 ENCOUNTER — Encounter (HOSPITAL_COMMUNITY)
Admission: AD | Disposition: A | Discharge status: Home / Self Care | Payer: Self-pay | Source: Ambulatory Visit | Attending: Urology

## 2014-12-15 ENCOUNTER — Ambulatory Visit (HOSPITAL_COMMUNITY)
Admission: AD | Admit: 2014-12-15 | Discharge: 2014-12-15 | Disposition: A | Discharge status: Home / Self Care | Payer: BC Managed Care – PPO | Source: Ambulatory Visit | Attending: Urology | Admitting: Urology

## 2014-12-15 ENCOUNTER — Ambulatory Visit (HOSPITAL_COMMUNITY): Payer: BC Managed Care – PPO | Admitting: Anesthesiology

## 2014-12-15 ENCOUNTER — Other Ambulatory Visit: Payer: Self-pay | Admitting: Urology

## 2014-12-15 ENCOUNTER — Encounter (HOSPITAL_COMMUNITY): Payer: Self-pay | Admitting: *Deleted

## 2014-12-15 DIAGNOSIS — Z886 Allergy status to analgesic agent status: Secondary | ICD-10-CM | POA: Diagnosis not present

## 2014-12-15 DIAGNOSIS — M797 Fibromyalgia: Secondary | ICD-10-CM | POA: Insufficient documentation

## 2014-12-15 DIAGNOSIS — J45909 Unspecified asthma, uncomplicated: Secondary | ICD-10-CM | POA: Insufficient documentation

## 2014-12-15 DIAGNOSIS — Z90710 Acquired absence of both cervix and uterus: Secondary | ICD-10-CM | POA: Insufficient documentation

## 2014-12-15 DIAGNOSIS — Z9049 Acquired absence of other specified parts of digestive tract: Secondary | ICD-10-CM | POA: Diagnosis not present

## 2014-12-15 DIAGNOSIS — N201 Calculus of ureter: Secondary | ICD-10-CM | POA: Diagnosis not present

## 2014-12-15 HISTORY — PX: CYSTOSCOPY W/ URETERAL STENT PLACEMENT: SHX1429

## 2014-12-15 LAB — BASIC METABOLIC PANEL
Anion gap: 13 (ref 5–15)
BUN: 18 mg/dL (ref 6–23)
CALCIUM: 9.6 mg/dL (ref 8.4–10.5)
CO2: 24 mmol/L (ref 19–32)
Chloride: 101 mmol/L (ref 96–112)
Creatinine, Ser: 0.85 mg/dL (ref 0.50–1.10)
GFR, EST NON AFRICAN AMERICAN: 79 mL/min — AB (ref >90)
Glucose, Bld: 97 mg/dL (ref 70–99)
Potassium: 3.4 mmol/L — ABNORMAL LOW (ref 3.5–5.1)
SODIUM: 138 mmol/L (ref 135–145)

## 2014-12-15 LAB — CBC
HCT: 44.6 % (ref 36.0–46.0)
HEMOGLOBIN: 14.8 g/dL (ref 12.0–15.0)
MCH: 31 pg (ref 26.0–34.0)
MCHC: 33.2 g/dL (ref 30.0–36.0)
MCV: 93.3 fL (ref 78.0–100.0)
PLATELETS: 276 10*3/uL (ref 150–400)
RBC: 4.78 MIL/uL (ref 3.87–5.11)
RDW: 13.1 % (ref 11.5–15.5)
WBC: 10.3 10*3/uL (ref 4.0–10.5)

## 2014-12-15 SURGERY — CYSTOSCOPY, WITH RETROGRADE PYELOGRAM AND URETERAL STENT INSERTION
Anesthesia: General | Laterality: Left

## 2014-12-15 MED ORDER — LACTATED RINGERS IV SOLN: INTRAVENOUS | Status: DC

## 2014-12-15 MED ORDER — MIDAZOLAM HCL 5 MG/5ML IJ SOLN
INTRAMUSCULAR | Status: DC | PRN
  Administered 2014-12-15: 2 mg via INTRAVENOUS

## 2014-12-15 MED ORDER — LIDOCAINE HCL (CARDIAC) 20 MG/ML IV SOLN
INTRAVENOUS | Status: AC
  Filled 2014-12-15: qty 5

## 2014-12-15 MED ORDER — FENTANYL CITRATE 0.05 MG/ML IJ SOLN
INTRAMUSCULAR | Status: AC
  Filled 2014-12-15: qty 2

## 2014-12-15 MED ORDER — ONDANSETRON HCL 4 MG/2ML IJ SOLN
INTRAMUSCULAR | Status: AC
  Filled 2014-12-15: qty 2

## 2014-12-15 MED ORDER — MIDAZOLAM HCL 2 MG/2ML IJ SOLN
INTRAMUSCULAR | Status: AC
  Filled 2014-12-15: qty 2

## 2014-12-15 MED ORDER — CEFAZOLIN SODIUM-DEXTROSE 2-3 GM-% IV SOLR
INTRAVENOUS | Status: AC
  Filled 2014-12-15: qty 50

## 2014-12-15 MED ORDER — FENTANYL CITRATE 0.05 MG/ML IJ SOLN
25.0000 ug | INTRAMUSCULAR | Status: DC | PRN
  Administered 2014-12-15 (×2): 50 ug via INTRAVENOUS

## 2014-12-15 MED ORDER — LIDOCAINE HCL (CARDIAC) 20 MG/ML IV SOLN
INTRAVENOUS | Status: DC | PRN
  Administered 2014-12-15: 50 mg via INTRAVENOUS

## 2014-12-15 MED ORDER — SODIUM CHLORIDE 0.9 % IR SOLN
Status: DC | PRN
  Administered 2014-12-15: 3000 mL

## 2014-12-15 MED ORDER — BELLADONNA ALKALOIDS-OPIUM 16.2-60 MG RE SUPP
RECTAL | Status: AC
Start: 2014-12-15 — End: 2014-12-15
  Filled 2014-12-15: qty 1

## 2014-12-15 MED ORDER — ONDANSETRON HCL 4 MG/2ML IJ SOLN
INTRAMUSCULAR | Status: DC | PRN
  Administered 2014-12-15: 4 mg via INTRAVENOUS

## 2014-12-15 MED ORDER — FENTANYL CITRATE 0.05 MG/ML IJ SOLN
INTRAMUSCULAR | Status: DC
Start: 2014-12-15 — End: 2014-12-15
  Filled 2014-12-15: qty 2

## 2014-12-15 MED ORDER — BELLADONNA ALKALOIDS-OPIUM 16.2-60 MG RE SUPP
RECTAL | Status: DC | PRN
  Administered 2014-12-15: 1 via RECTAL

## 2014-12-15 MED ORDER — LACTATED RINGERS IV SOLN
INTRAVENOUS | Status: DC | PRN
  Administered 2014-12-15: 17:00:00 via INTRAVENOUS

## 2014-12-15 MED ORDER — LIDOCAINE HCL 2 % EX GEL
CUTANEOUS | Status: AC
  Filled 2014-12-15: qty 10

## 2014-12-15 MED ORDER — PROPOFOL 10 MG/ML IV BOLUS
INTRAVENOUS | Status: AC
  Filled 2014-12-15: qty 20

## 2014-12-15 MED ORDER — OXYCODONE HCL 5 MG PO TABS
5.0000 mg | ORAL_TABLET | ORAL | Status: DC | PRN
  Administered 2014-12-15: 5 mg via ORAL

## 2014-12-15 MED ORDER — PROPOFOL 10 MG/ML IV BOLUS
INTRAVENOUS | Status: DC | PRN
  Administered 2014-12-15: 30 mg via INTRAVENOUS
  Administered 2014-12-15: 200 mg via INTRAVENOUS

## 2014-12-15 MED ORDER — OXYCODONE HCL 5 MG PO TABS
ORAL_TABLET | ORAL | Status: AC
  Filled 2014-12-15: qty 1

## 2014-12-15 MED ORDER — FENTANYL CITRATE 0.05 MG/ML IJ SOLN
INTRAMUSCULAR | Status: DC | PRN
  Administered 2014-12-15 (×4): 25 ug via INTRAVENOUS

## 2014-12-15 MED ORDER — CEFAZOLIN SODIUM-DEXTROSE 2-3 GM-% IV SOLR
2.0000 g | INTRAVENOUS | Status: AC
  Administered 2014-12-15: 2 g via INTRAVENOUS

## 2014-12-15 SURGICAL SUPPLY — 25 items
BAG URO CATCHER STRL LF (DRAPE) ×3 IMPLANT
BASKET LASER NITINOL 1.9FR (BASKET) ×3 IMPLANT
BASKET STONE NCOMPASS (UROLOGICAL SUPPLIES) IMPLANT
CATH URET 5FR 28IN OPEN ENDED (CATHETERS) ×3 IMPLANT
CLOTH BEACON ORANGE TIMEOUT ST (SAFETY) IMPLANT
EXTRACTOR STONE NITINOL NGAGE (UROLOGICAL SUPPLIES) IMPLANT
FIBER LASER FLEXIVA 1000 (UROLOGICAL SUPPLIES) IMPLANT
FIBER LASER FLEXIVA 200 (UROLOGICAL SUPPLIES) IMPLANT
FIBER LASER FLEXIVA 365 (UROLOGICAL SUPPLIES) IMPLANT
FIBER LASER FLEXIVA 550 (UROLOGICAL SUPPLIES) IMPLANT
FIBER LASER TRAC TIP (UROLOGICAL SUPPLIES) IMPLANT
GLOVE SURG SS PI 8.0 STRL IVOR (GLOVE) IMPLANT
GOWN STRL REUS W/TWL XL LVL3 (GOWN DISPOSABLE) ×6 IMPLANT
GUIDEWIRE STR DUAL SENSOR (WIRE) ×3 IMPLANT
IV NS 1000ML (IV SOLUTION) ×2
IV NS 1000ML BAXH (IV SOLUTION) ×1 IMPLANT
IV NS IRRIG 3000ML ARTHROMATIC (IV SOLUTION) ×3 IMPLANT
MANIFOLD NEPTUNE II (INSTRUMENTS) ×3 IMPLANT
PACK CYSTO (CUSTOM PROCEDURE TRAY) ×3 IMPLANT
SHEATH ACCESS URETERAL 24CM (SHEATH) ×3 IMPLANT
SHEATH ACCESS URETERAL 38CM (SHEATH) IMPLANT
SHEATH URET ACCESS 10/12FR (MISCELLANEOUS) IMPLANT
SHIELD EYE BINOCULAR (MISCELLANEOUS) IMPLANT
TUBING CONNECTING 10 (TUBING) ×2 IMPLANT
TUBING CONNECTING 10' (TUBING) ×1

## 2014-12-15 NOTE — Anesthesia Postprocedure Evaluation (Signed)
  Anesthesia Post-op Note  Patient: Melissa Allen  Procedure(s) Performed: Procedure(s): CYSTOSCOPY, LEFT URETEROSCOPYU WITH STONE EXTRACTION (Left)  Patient Location: PACU  Anesthesia Type:General  Level of Consciousness: awake and alert   Airway and Oxygen Therapy: Patient Spontanous Breathing  Post-op Pain: moderate  Post-op Assessment: Post-op Vital signs reviewed, Patient's Cardiovascular Status Stable and Respiratory Function Stable  Post-op Vital Signs: Reviewed  Filed Vitals:   12/15/14 1750  BP:   Pulse:   Temp: 36.7 C  Resp:     Complications: No apparent anesthesia complications

## 2014-12-15 NOTE — Transfer of Care (Signed)
Immediate Anesthesia Transfer of Care Note  Patient: Melissa Allen  Procedure(s) Performed: Procedure(s) (LRB): CYSTOSCOPY, LEFT URETEROSCOPYU WITH STONE EXTRACTION (Left)  Patient Location: PACU  Anesthesia Type: General  Level of Consciousness: sedated, patient cooperative and responds to stimulation  Airway & Oxygen Therapy: Patient Spontanous Breathing and Patient connected to face mask oxgen  Post-op Assessment: Report given to PACU RN and Post -op Vital signs reviewed and stable  Post vital signs: Reviewed and stable  Complications: No apparent anesthesia complications

## 2014-12-15 NOTE — Discharge Instructions (Signed)
CYSTOSCOPY HOME CARE INSTRUCTIONS  Activity: Rest for the remainder of the day.  Do not drive or operate equipment today.  You may resume normal activities in one to two days as instructed by your physician.   Meals: Drink plenty of liquids and eat light foods such as gelatin or soup this evening.  You may return to a normal meal plan tomorrow.  Return to Work: You may return to work in one to two days or as instructed by your physician.  Special Instructions / Symptoms: Call your physician if any of these symptoms occur:   -persistent or heavy bleeding  -bleeding which continues after first few urination  -large blood clots that are difficult to pass  -urine stream diminishes or stops completely  -fever equal to or higher than 101 degrees Farenheit.  -cloudy urine with a strong, foul odor  -severe pain  Females should always wipe from front to back after elimination.  You may feel some burning pain when you urinate.  This should disappear with time.  Applying moist heat to the lower abdomen or a hot tub bath may help relieve the pain. \   Call for an appointment to arrange follow-up.  Patient Signature:  ________________________________________________________  Nurse's Signature:  ________________________________________________________

## 2014-12-15 NOTE — Anesthesia Procedure Notes (Signed)
Procedure Name: LMA Insertion Date/Time: 12/15/2014 5:21 PM Performed by: Anne Fu Pre-anesthesia Checklist: Patient identified, Emergency Drugs available, Suction available, Patient being monitored and Timeout performed Patient Re-evaluated:Patient Re-evaluated prior to inductionOxygen Delivery Method: Circle system utilized Preoxygenation: Pre-oxygenation with 100% oxygen Intubation Type: IV induction Ventilation: Mask ventilation without difficulty LMA: LMA inserted LMA Size: 4.0 Number of attempts: 1 Placement Confirmation: positive ETCO2 and breath sounds checked- equal and bilateral Tube secured with: Tape

## 2014-12-15 NOTE — Anesthesia Preprocedure Evaluation (Addendum)
Anesthesia Evaluation  Patient identified by MRN, date of birth, ID band Patient awake    Reviewed: Allergy & Precautions, H&P , NPO status , Patient's Chart, lab work & pertinent test results  History of Anesthesia Complications (+) PONV  Airway Mallampati: II  TM Distance: >3 FB Neck ROM: Full    Dental no notable dental hx. (+) Teeth Intact, Dental Advisory Given   Pulmonary asthma ,  breath sounds clear to auscultation  Pulmonary exam normal       Cardiovascular hypertension, Pt. on medications Rhythm:Regular Rate:Normal     Neuro/Psych  Headaches, Anxiety Depression    GI/Hepatic negative GI ROS, Neg liver ROS,   Endo/Other  negative endocrine ROS  Renal/GU Renal disease  negative genitourinary   Musculoskeletal  (+) Fibromyalgia -  Abdominal   Peds  Hematology negative hematology ROS (+)   Anesthesia Other Findings   Reproductive/Obstetrics negative OB ROS                            Anesthesia Physical Anesthesia Plan  ASA: III  Anesthesia Plan: General   Post-op Pain Management:    Induction: Intravenous  Airway Management Planned: LMA  Additional Equipment:   Intra-op Plan:   Post-operative Plan: Extubation in OR  Informed Consent: I have reviewed the patients History and Physical, chart, labs and discussed the procedure including the risks, benefits and alternatives for the proposed anesthesia with the patient or authorized representative who has indicated his/her understanding and acceptance.   Dental advisory given  Plan Discussed with: CRNA  Anesthesia Plan Comments:         Anesthesia Quick Evaluation

## 2014-12-15 NOTE — H&P (Signed)
Reason For Visit Seen today as a work-in for worsening "kidney stone pain."   Active Problems Problems  1. Calculus of left ureter (N20.1)   Assessed By: Jimmey Ralph (Urology); Last Assessed: 15 Dec 2014 2. Left flank pain (R10.9)   Assessed By: Jimmey Ralph (Urology); Last Assessed: 15 Dec 2014 3. Microscopic hematuria (R31.2)   Assessed By: Jimmey Ralph (Urology); Last Assessed: 15 Dec 2014 4. Nausea (R11.0)   Assessed By: Jimmey Ralph (Urology); Last Assessed: 15 Dec 2014  History of Present Illness 49 YO female patient of Dr. Lyndal Rainbow seen today as a work-in for worsening "kidney stone pain." Last seen 4 days ago with 3-4 day history of bladder discomfort and spasms with associated frequency of urination. She states symptoms feel like her episode in 2014. Her symptoms are intermittent and of moderate severity. Shes denies fevers, nausea and vomiting, dysuria, incontinence or gross hematuria. She also denies pain. Of note, pt did have a lumbar laminectomy on 3/21 making her ability to sit for long periods of time difficult. Patient hasn't had flank pain but again recently underwent back surgery and has some pain associated with that.     KUB shows a 4-5 mm stone in the bladder may be in the left ureterovesical junction. This appears to be a stone that was in the left kidney in 2014.     Interval Hx:   Today states she has been so nauseated for the past 24 hrs has had no solid foods and has only sipped on some lemonade. Worsening left flank/back pain radiating into LLQ. Has not been able to strain very consistently with recent back surgery. Has been controlling pain with PRN Oxycodone.   Past Medical History Problems  1. History of Asthma (J45.909) 2. History of Calculus of ureter (N20.1) 3. History of Fibromyalgia (M79.7) 4. History of kidney stones (W58.099)  Surgical History Problems  1. History of Ankle Surgery 2. History of Back Surgery 3. History of Back  Surgery 4. History of Cholecystectomy 5. History of Cystoscopy With Ureteroscopy Right 6. History of Cystoscopy With Ureteroscopy With Removal Of Calculus 7. History of Hysterectomy  Current Meds 1. AeroChamber Plus Flo-Vu Miscellaneous;  Therapy: [id number] to Recorded 2. AmLODIPine Besylate 5 MG Oral Tablet;  Therapy: 01Apr2014 to Recorded 3. Cymbalta 60 MG Oral Capsule Delayed Release Particles;  Therapy: (Recorded:14Jan2009) to Recorded 4. Famotidine 20 MG Oral Tablet;  Therapy: 09Apr2014 to Recorded 5. Gabapentin 300 MG Oral Capsule;  Therapy: (Recorded:04Apr2016) to Recorded 6. Ipratropium-Albuterol 0.5-2.5 (3) MG/3ML Inhalation Solution;  Therapy: 29Apr2014 to Recorded 7. Losartan Potassium 50 MG Oral Tablet;  Therapy: 07Apr2014 to Recorded 8. Meloxicam 15 MG Oral Tablet;  Therapy: 97QBH4193 to Recorded 9. Ondansetron 8 MG Oral Tablet Dispersible; TAKE 8 MG 3 times daily PRN;  Therapy: 79KWI0973 to (Last ZH:29JME2683)  Requested for: 41DQQ2297 Ordered 10. Oxycodone-Acetaminophen 5-325 MG Oral Tablet; take 1 or 2 tablets q 4-6 hours prn   pain;   Therapy: 98XQJ1941 to (Last DE:08XKG8185) Ordered 11. Symbicort 160-4.5 MCG/ACT Inhalation Aerosol;   Therapy: 63JSH7026 to Recorded 12. TraZODone HCl - 100 MG Oral Tablet;   Therapy: (Recorded:08Dec2008) to Recorded 13. Triamterene-HCTZ 37.5-25 MG Oral Tablet;   Therapy: 22Oct2013 to Recorded 14. Ventolin HFA 108 (90 Base) MCG/ACT Inhalation Aerosol Solution;   Therapy: 37CHY8502 to Recorded  Allergies Medication  1. Adhesive Tape TAPE 2. Codeine Derivatives  Family History Problems  1. Family history of Acute Myocardial Infarction : Father 2. Family history of Diabetes Mellitus : Mother 3.  Family history of Family Health Status Number Of Children   Three daughters 4. Family history of Hypertension : Mother  Social History Problems  1. Denied: Alcohol Use 2. Caffeine Use   One per day 3. Marital History -  Divorced 4. Never a smoker 5. Occupation:   Pharmacist, hospital 6. Denied: Tobacco Use  Review of Systems Genitourinary, constitutional, skin, eye, otolaryngeal, hematologic/lymphatic, cardiovascular, pulmonary, endocrine, musculoskeletal, gastrointestinal, neurological and psychiatric system(s) were reviewed and pertinent findings if present are noted and are otherwise negative.  Gastrointestinal: nausea, flank pain and abdominal pain.  Musculoskeletal: back pain.    Vitals Vital Signs [Data Includes: Last 1 Day]  Recorded: 08Apr2016 01:21PM  Blood Pressure: 124 / 85, Sitting Temperature: 98.2 F, Oral Heart Rate: 120 Respiration: 18  Physical Exam Constitutional: Well nourished and well developed . In acute distress. The patient appears well hydrated. Crying and pacing in exam room.  ENT:. The ears and nose are normal in appearance.  Neck: The appearance of the neck is normal.  Pulmonary: No respiratory distress.  Cardiovascular: Heart rate and rhythm are normal.  Abdomen: The abdomen is mildly obese. Moderate tenderness in the LLQ is present, but no tenderness in the RLQ. No right CVA tenderness and moderate left CVA tenderness.  Skin: Normal skin turgor.  Neuro/Psych:. Mood and affect are appropriate.    Results/Data Urine [Data Includes: Last 1 Day]   08Apr2016  COLOR GREEN   APPEARANCE CLEAR   SPECIFIC GRAVITY 1.025   pH 6.5   GLUCOSE NEG mg/dL  BILIRUBIN NEG   KETONE NEG mg/dL  BLOOD MOD   PROTEIN TRACE mg/dL  UROBILINOGEN 0.2 mg/dL  NITRITE NEG   LEUKOCYTE ESTERASE NEG   SQUAMOUS EPITHELIAL/HPF RARE   WBC 0-2 WBC/hpf  RBC 7-10 RBC/hpf  BACTERIA RARE   CRYSTALS NONE SEEN   CASTS NONE SEEN    The following images/tracing/specimen were independently visualized:  KUB: today is unchanged from 4 days ago. There is a calcification in lower pelvis which could be a stone w/in the bladder but no bilateral ureteral calculus noted. Dr. Junious Silk has reviewed KUB today and agrees  that this is unchanged and would proceed with CT urogram to fully evaluate. CT urogram shows that there is a left ureteral calculus is at UVJ but no overt hydronephrosis or hydroureter noted.  The following clinical lab reports were reviewed:  UA: microscopic hematuria. Selected Results  AU CT-STONE PROTOCOL 08Apr2016 12:00AM Jimmey Ralph   Test Name Result Flag Reference  CT-STONE PROTOCOL (Report)    ** RADIOLOGY REPORT BY [facility] **   CLINICAL DATA: Left flank pain, microhematuria, history of renal stones. Prior cholecystectomy, appendectomy, and hysterectomy.  EXAM: CT ABDOMEN AND PELVIS WITHOUT CONTRAST  TECHNIQUE: Multidetector CT imaging of the abdomen and pelvis was performed following the standard protocol without IV contrast.  COMPARISON: 11/03/2012  FINDINGS: Lower chest: Lung bases are clear.  Bilateral breast augmentation. Left breast implant is deflated.  Hepatobiliary: 5.1 x 6.6 cm cyst in the right hepatic dome (series 2/image 9), previously 5.1 x 5.6 cm. Two additional 10 mm cysts in the left hepatic lobe (series 2/ images 10-11).  Status post cholecystectomy. No intrahepatic or extrahepatic ductal dilatation.  Pancreas: Within normal limits.  Spleen: Within normal limits.  Adrenals/Urinary Tract: Left adrenal gland is within normal limits.  Stable 12 mm right adrenal nodule (series 2/image 20), likely reflecting a benign adrenal adenoma.  Three nonobstructing right lower pole renal calculi measuring 1-2 mm.  Two nonobstructing  left renal calculi measuring 2-3 mm.  4 mm distal left ureteral calculus at the UVJ (series 2/image 83).  Bladder is underdistended but unremarkable.  Stomach/Bowel: Stomach is within normal limits.  No evidence of bowel obstruction.  Normal appendix.  Vascular/Lymphatic: New Atherosclerotic calcifications of the abdominal aorta and branch vessels.  No suspicious abdominopelvic  lymphadenopathy.  Reproductive: Status post hysterectomy.  Left ovary is notable for a 1.9 cm left ovarian cyst/ follicle.  Right ovary is within normal limits.  Other: No abdominopelvic ascites.  Musculoskeletal: Degenerative changes of the visualized thoracolumbar spine.  IMPRESSION: 4 mm distal left ureteral calculus at the UVJ. No hydronephrosis.  Bilateral nonobstructing calculi measuring up to 3 mm.  Additional stable ancillary findings as above.   Electronically Signed  By: Julian Hy M.D.  On: 12/15/2014 15:08   Assessment Assessed  1. Nausea (R11.0) 2. Calculus of left ureter (N20.1) 3. Left flank pain (R10.9) 4. Microscopic hematuria (R31.2)  Plan  Calculus of left ureter  1. Administer: Ketorolac Tromethamine 60 MG/2ML Injection Solution; INJECT 60  MG  Intramuscular; To Be Done: 08Apr2016 2. KUB; Status:Complete;   Done: 08Apr2016 12:00AM Calculus of left ureter, Left flank pain, Microscopic hematuria, Nausea  3. Administered: Ketorolac Tromethamine 60 MG/2ML Intramuscular Solution 4. Administered: Promethazine HCl 25 MG/ML Injection Solution Health Maintenance  5. UA With REFLEX; [Do Not Release]; Status:Complete;   Done: 92KMQ2863 01:08PM Nausea  6. Administer: Promethazine HCl 25 MG/ML Injection Solution; INJECT 25 MG  Intramuscular; To Be Done: 08Apr2016  Ketorolac 60 mg IM today  Promethazine 25 mg IM today   Dr. Junious Silk discussed with Dr. Jeffie Pollock and they both agree since pt is so uncomfortable and stone has not progressed in 4 days will proceed with urgent cystourethroscopy, L RPG, stone extraction, and possible double J stent placement this afternoon. Risk and benefits discussed which include: general anesthesia, hematuria, infection, and possible injury to bladder or ureter. She voices understanding and demands to proceed.  AU CT-STONE PROTOCOL; Status:Resulted - Requires Verification;  Done: 81RRN1657 12:00AM Due:10Apr2016; Marked  Important;Ordered; Today;  XUX:YBFX flank pain; Ordered OV:ANVBTY, Diane;   Electronics engineer signed by : Jimmey Ralph, Trey Paula; Dec 15 2014  3:44PM EST

## 2014-12-15 NOTE — Brief Op Note (Signed)
12/15/2014  5:36 PM  PATIENT:  [name]  49 y.o. female  PRE-OPERATIVE DIAGNOSIS:  Left distal stone  POST-OPERATIVE DIAGNOSIS:  same PROCEDURE:  Procedure(s): CYSTOSCOPY WITH LEFT URETEROSCOPIC STONE EXTRACTION.  SURGEON:  Surgeon(s) and Role:    * Irine Seal, MD - Primary  PHYSICIAN ASSISTANT:   ASSISTANTS: none   ANESTHESIA:   general  EBL:     BLOOD ADMINISTERED:none  DRAINS: none   LOCAL MEDICATIONS USED:  NONE  SPECIMEN:  Source of Specimen:  left ureteral stone  DISPOSITION OF SPECIMEN:  to family to bring to office  COUNTS:  YES  TOURNIQUET:  * No tourniquets in log *  DICTATION: .Other Dictation: Dictation Number [id number]  PLAN OF CARE: Discharge to home after PACU  PATIENT DISPOSITION:  PACU - hemodynamically stable.   Delay start of Pharmacological VTE agent (>24hrs) due to surgical blood loss or risk of bleeding: not applicable

## 2014-12-16 NOTE — Op Note (Signed)
Melissa Allen, Melissa Allen                 ACCOUNT NO.:  [id number]  MEDICAL RECORD NO.:  [id number]  LOCATION:  [facility]                         FACILITY:  [facility]  PHYSICIAN:  Marshall Cork. Jeffie Pollock, M.D.    DATE OF BIRTH:  02-26-1966  DATE OF PROCEDURE:  12/15/2014 DATE OF DISCHARGE:  12/15/2014                              OPERATIVE REPORT   PROCEDURE:  Cystoscopy with left ureteroscopic stone extraction.  PREOPERATIVE DIAGNOSIS:  Left distal ureteral stone.  POSTOPERATIVE DIAGNOSIS:  Left distal ureteral stone.  SURGEON:  Marshall Cork. Jeffie Pollock, M.D.  ANESTHESIA:  General.  SPECIMENS:  None.  DRAINS:  None.  COMPLICATIONS:  None.  BLOOD LOSS:  None.  INDICATIONS:  [name] is a 49 year old white female with a history of stones.  She has a left distal ureteral stone, which has failed to pass with conservative management.  She presented today with increased pain and has elected to undergo ureteroscopy.  FINDINGS AND PROCEDURE:  She was taken to the operating room where general anesthetic was induced.  She was given Ancef for perioperative antibiotic prophylaxis.  She was placed in lithotomy position.  PAS hose were not placed because the IV had to be placed in her foot.  Her perineum and genitalia were prepped with Betadine solution.  She was draped in usual sterile fashion.  Cystoscopy was performed using the 22- [place] scope and 30-degree lens.  Examination revealed a normal urethra. The bladder wall was smooth and pale without tumor, stones, or inflammation.  The right ureteral orifice was unremarkable.  The left ureteral orifice was somewhat heaped up, suggestive of a small ureterocele, the stone was not impacted at the orifice.  A guidewire was passed to the kidney under fluoroscopic guidance.  The stone could be seen along the wire in the distal ureter.  The cystoscope was removed and a 12-French digital access sheath inner core was used to dilate the ureter over the wire.  The ureter  dilated easily.  A 6.5-French short ureteroscope was then passed alongside the wire.  The stone was visualized, grasped, and removed without difficulty using a Nitinol basket.  The guidewire was then removed as it was not felt a stent was indicated.  The bladder was drained.  B and O suppository was placed. She was taken down from lithotomy position.  Her anesthetic was reversed.  She was moved to recovery room in a stable condition.  There were no complications.     Marshall Cork. Jeffie Pollock, M.D.     JJW/MEDQ  D:  12/15/2014  T:  12/16/2014  Job:  [id number]

## 2014-12-18 ENCOUNTER — Encounter (HOSPITAL_COMMUNITY): Payer: Self-pay | Admitting: Urology

## 2015-01-03 ENCOUNTER — Other Ambulatory Visit: Payer: Self-pay | Admitting: Family Medicine

## 2015-01-03 NOTE — Telephone Encounter (Signed)
Rx called in to requested pharmacy 

## 2015-01-03 NOTE — Telephone Encounter (Signed)
Last f/u appt 11/2014 

## 2015-01-04 ENCOUNTER — Other Ambulatory Visit: Payer: Self-pay | Admitting: Family Medicine

## 2015-01-05 NOTE — Telephone Encounter (Signed)
Pt has not had recent f/u appt

## 2015-01-05 NOTE — Telephone Encounter (Signed)
Rx called in to requested pharmacy 

## 2015-01-31 ENCOUNTER — Other Ambulatory Visit: Payer: Self-pay | Admitting: Family Medicine

## 2015-01-31 NOTE — Telephone Encounter (Signed)
Rx called in to requested pharmacy 

## 2015-01-31 NOTE — Telephone Encounter (Signed)
Last f/u appt 01/2014 

## 2015-02-12 ENCOUNTER — Other Ambulatory Visit: Payer: Self-pay | Admitting: Family Medicine

## 2015-02-13 NOTE — Telephone Encounter (Signed)
Last f/u 01/2014

## 2015-02-13 NOTE — Telephone Encounter (Signed)
Rx called in to requested pharmacy 

## 2015-03-05 ENCOUNTER — Other Ambulatory Visit: Payer: Self-pay | Admitting: Family Medicine

## 2015-03-05 NOTE — Telephone Encounter (Signed)
Rx called in to requested pharmacy 

## 2015-03-05 NOTE — Telephone Encounter (Signed)
Last f/u appt 11/2014 

## 2015-03-30 ENCOUNTER — Other Ambulatory Visit: Payer: Self-pay | Admitting: Family Medicine

## 2015-03-30 NOTE — Telephone Encounter (Signed)
Last f/u appt 11/2014 

## 2015-03-30 NOTE — Telephone Encounter (Signed)
Rx called in to requested pharmacy 

## 2015-04-02 ENCOUNTER — Other Ambulatory Visit: Payer: Self-pay | Admitting: Family Medicine

## 2015-04-02 NOTE — Telephone Encounter (Signed)
Rx called in to requested pharmacy 

## 2015-04-02 NOTE — Telephone Encounter (Signed)
Last f/u appt 11/2014 

## 2015-04-12 ENCOUNTER — Other Ambulatory Visit: Payer: Self-pay | Admitting: Neurosurgery

## 2015-04-23 ENCOUNTER — Other Ambulatory Visit: Payer: Self-pay

## 2015-04-23 MED ORDER — ALPRAZOLAM 0.25 MG PO TABS: ORAL_TABLET | ORAL | Status: DC

## 2015-04-23 NOTE — Telephone Encounter (Signed)
Rx called in to requested pharmacy 

## 2015-04-23 NOTE — Telephone Encounter (Signed)
Pt left v/m requesting refill alprazolam to [facility]. rx last printed # 60 on 03/30/15. Pt last seen for f/u on 11/15/2014. Pt has 7-8 days of med left but is scheduled to have back surgery on 05/01/15 and will not be able to come to office for appt prior to surgery. Pt request cb.

## 2015-04-30 ENCOUNTER — Encounter (HOSPITAL_COMMUNITY): Payer: Self-pay

## 2015-04-30 ENCOUNTER — Encounter (HOSPITAL_COMMUNITY)
Admission: RE | Admit: 2015-04-30 | Discharge: 2015-04-30 | Disposition: A | Discharge status: Home / Self Care | Payer: BC Managed Care – PPO | Source: Ambulatory Visit | Attending: Neurosurgery | Admitting: Neurosurgery

## 2015-04-30 HISTORY — DX: Acute pancreatitis without necrosis or infection, unspecified: K85.90

## 2015-04-30 HISTORY — DX: Unspecified convulsions: R56.9

## 2015-04-30 HISTORY — DX: Reserved for inherently not codable concepts without codable children: IMO0001

## 2015-04-30 HISTORY — DX: Anxiety disorder, unspecified: F41.9

## 2015-04-30 HISTORY — DX: Unspecified osteoarthritis, unspecified site: M19.90

## 2015-04-30 LAB — CBC WITH DIFFERENTIAL/PLATELET
BASOS ABS: 0.1 10*3/uL (ref 0.0–0.1)
Basophils Relative: 1 % (ref 0–1)
EOS ABS: 0.2 10*3/uL (ref 0.0–0.7)
EOS PCT: 3 % (ref 0–5)
HCT: 43.5 % (ref 36.0–46.0)
Hemoglobin: 14.7 g/dL (ref 12.0–15.0)
Lymphocytes Relative: 34 % (ref 12–46)
Lymphs Abs: 2 10*3/uL (ref 0.7–4.0)
MCH: 30.9 pg (ref 26.0–34.0)
MCHC: 33.8 g/dL (ref 30.0–36.0)
MCV: 91.6 fL (ref 78.0–100.0)
Monocytes Absolute: 0.4 10*3/uL (ref 0.1–1.0)
Monocytes Relative: 7 % (ref 3–12)
Neutro Abs: 3.2 10*3/uL (ref 1.7–7.7)
Neutrophils Relative %: 55 % (ref 43–77)
PLATELETS: 215 10*3/uL (ref 150–400)
RBC: 4.75 MIL/uL (ref 3.87–5.11)
RDW: 13.4 % (ref 11.5–15.5)
WBC: 5.9 10*3/uL (ref 4.0–10.5)

## 2015-04-30 LAB — BASIC METABOLIC PANEL
Anion gap: 9 (ref 5–15)
BUN: 8 mg/dL (ref 6–20)
CALCIUM: 9.6 mg/dL (ref 8.9–10.3)
CO2: 27 mmol/L (ref 22–32)
CREATININE: 0.73 mg/dL (ref 0.44–1.00)
Chloride: 102 mmol/L (ref 101–111)
GFR calc Af Amer: >60 mL/min (ref >60)
Glucose, Bld: 101 mg/dL — ABNORMAL HIGH (ref 65–99)
Potassium: 4 mmol/L (ref 3.5–5.1)
SODIUM: 138 mmol/L (ref 135–145)

## 2015-04-30 LAB — SURGICAL PCR SCREEN
MRSA, PCR: NEGATIVE
Staphylococcus aureus: POSITIVE — AB

## 2015-04-30 LAB — ABO/RH: ABO/RH(D): AB POS

## 2015-04-30 LAB — TYPE AND SCREEN
ABO/RH(D): AB POS
Antibody Screen: NEGATIVE

## 2015-04-30 MED ORDER — CEFAZOLIN SODIUM-DEXTROSE 2-3 GM-% IV SOLR
2.0000 g | INTRAVENOUS | Status: AC
  Administered 2015-05-01: 2 g via INTRAVENOUS
  Filled 2015-04-30: qty 50

## 2015-04-30 MED ORDER — DEXAMETHASONE SODIUM PHOSPHATE 10 MG/ML IJ SOLN
10.0000 mg | INTRAMUSCULAR | Status: AC
  Administered 2015-05-01: 10 mg via INTRAVENOUS
  Filled 2015-04-30: qty 1

## 2015-04-30 NOTE — Pre-Procedure Instructions (Signed)
TAYLORE HINDE  04/30/2015      [facility] [place] [address], [place] [address] Phone: [phone] Fax: [phone]  [id number] [place], [place] [address] [place] [address] Phone: [phone] Fax: [phone]  [place], [place] [address] [place] [address] Phone: [phone] Fax: [phone]    Your procedure is scheduled on  05/01/2015  Report to [facility] Admitting at 6:00 A.M.  Call this number if you have problems the morning of surgery:  [phone]   Remember:  Do not eat food or drink liquids after midnight.  Take these medicines the morning of surgery with A SIP OF WATER : Xanax, Symbicort, ..........  if desired take pain medicine &/or use nasal spray   Do not wear jewelry, make-up or nail polish.  Do not wear lotions, powders, or perfumes.  You may wear deodorant.  Do not shave 48 hours prior to surgery.    Do not bring valuables to the hospital.  [facility] is not responsible for any belongings or valuables.  Contacts, dentures or bridgework may not be worn into surgery.  Leave your suitcase in the car.  After surgery it may be brought to your room.  For patients admitted to the hospital, discharge time will be determined by your treatment team.  Patients discharged the day of surgery will not be allowed to drive home.   Name and phone number of your driver:   /w spouse Special instructions:  Special Instructions: [facility] - Preparing for Surgery  Before surgery, you can play an important role.  Because skin is not sterile, your skin needs to be as free of germs as possible.  You can reduce the number of germs on you skin by washing with CHG (chlorahexidine gluconate) soap before surgery.  CHG is an antiseptic cleaner which kills germs  and bonds with the skin to continue killing germs even after washing.  Please DO NOT use if you have an allergy to CHG or antibacterial soaps.  If your skin becomes reddened/irritated stop using the CHG and inform your nurse when you arrive at Short Stay.  Do not shave (including legs and underarms) for at least 48 hours prior to the first CHG shower.  You may shave your face.  Please follow these instructions carefully:   1.  Shower with CHG Soap the night before surgery and the  morning of Surgery.  2.  If you choose to wash your hair, wash your hair first as usual with your  normal shampoo.  3.  After you shampoo, rinse your hair and body thoroughly to remove the  Shampoo.  4.  Use CHG as you would any other liquid soap.  You can apply chg directly to the skin and wash gently with scrungie or a clean washcloth.  5.  Apply the CHG Soap to your body ONLY FROM THE NECK DOWN.    Do not use on open wounds or open sores.  Avoid contact with your eyes, ears, mouth and genitals (private parts).  Wash genitals (private parts)   with your normal soap.  6.  Wash thoroughly, paying special attention to the area where your surgery will be performed.  7.  Thoroughly rinse your body with warm water from the neck down.  8.  DO NOT shower/wash with your normal soap after using and rinsing off   the CHG Soap.  9.  Pat yourself dry with a clean towel.            10.  Wear clean pajamas.            11.  Place clean sheets on your bed the night of your first shower and do not sleep with pets.  Day of Surgery  Do not apply any lotions/deodorants the morning of surgery.  Please wear clean clothes to the hospital/surgery center.  Please read over the following fact sheets that you were given. Pain Booklet, Coughing and Deep Breathing, Blood Transfusion Information, MRSA Information and Surgical Site Infection Prevention

## 2015-05-01 ENCOUNTER — Inpatient Hospital Stay (HOSPITAL_COMMUNITY): Payer: BC Managed Care – PPO

## 2015-05-01 ENCOUNTER — Inpatient Hospital Stay (HOSPITAL_COMMUNITY)
Admission: AD | Admit: 2015-05-01 | Discharge: 2015-05-03 | DRG: 460 | Disposition: A | Discharge status: Home / Self Care | Payer: BC Managed Care – PPO | Source: Ambulatory Visit | Attending: Neurosurgery | Admitting: Neurosurgery

## 2015-05-01 ENCOUNTER — Encounter (HOSPITAL_COMMUNITY): Payer: Self-pay | Admitting: *Deleted

## 2015-05-01 ENCOUNTER — Inpatient Hospital Stay (HOSPITAL_COMMUNITY): Payer: BC Managed Care – PPO | Admitting: Anesthesiology

## 2015-05-01 ENCOUNTER — Encounter (HOSPITAL_COMMUNITY)
Admission: AD | Disposition: A | Discharge status: Home / Self Care | Payer: BC Managed Care – PPO | Source: Ambulatory Visit | Attending: Neurosurgery

## 2015-05-01 DIAGNOSIS — Z79891 Long term (current) use of opiate analgesic: Secondary | ICD-10-CM

## 2015-05-01 DIAGNOSIS — Z888 Allergy status to other drugs, medicaments and biological substances status: Secondary | ICD-10-CM

## 2015-05-01 DIAGNOSIS — Z981 Arthrodesis status: Secondary | ICD-10-CM

## 2015-05-01 DIAGNOSIS — Z885 Allergy status to narcotic agent status: Secondary | ICD-10-CM | POA: Diagnosis not present

## 2015-05-01 DIAGNOSIS — M4806 Spinal stenosis, lumbar region: Secondary | ICD-10-CM | POA: Diagnosis present

## 2015-05-01 DIAGNOSIS — M797 Fibromyalgia: Secondary | ICD-10-CM | POA: Diagnosis present

## 2015-05-01 DIAGNOSIS — Z91048 Other nonmedicinal substance allergy status: Secondary | ICD-10-CM

## 2015-05-01 DIAGNOSIS — Z419 Encounter for procedure for purposes other than remedying health state, unspecified: Secondary | ICD-10-CM

## 2015-05-01 DIAGNOSIS — Z79899 Other long term (current) drug therapy: Secondary | ICD-10-CM

## 2015-05-01 DIAGNOSIS — Z7951 Long term (current) use of inhaled steroids: Secondary | ICD-10-CM | POA: Diagnosis not present

## 2015-05-01 DIAGNOSIS — I10 Essential (primary) hypertension: Secondary | ICD-10-CM | POA: Diagnosis present

## 2015-05-01 DIAGNOSIS — F419 Anxiety disorder, unspecified: Secondary | ICD-10-CM | POA: Diagnosis present

## 2015-05-01 DIAGNOSIS — M545 Low back pain: Secondary | ICD-10-CM | POA: Diagnosis present

## 2015-05-01 DIAGNOSIS — Z01812 Encounter for preprocedural laboratory examination: Secondary | ICD-10-CM | POA: Diagnosis not present

## 2015-05-01 DIAGNOSIS — M48062 Spinal stenosis, lumbar region with neurogenic claudication: Secondary | ICD-10-CM | POA: Diagnosis present

## 2015-05-01 DIAGNOSIS — M5136 Other intervertebral disc degeneration, lumbar region: Principal | ICD-10-CM | POA: Diagnosis present

## 2015-05-01 DIAGNOSIS — Z452 Encounter for adjustment and management of vascular access device: Secondary | ICD-10-CM

## 2015-05-01 DIAGNOSIS — M4310 Spondylolisthesis, site unspecified: Secondary | ICD-10-CM | POA: Diagnosis present

## 2015-05-01 SURGERY — POSTERIOR LUMBAR FUSION 1 LEVEL
Anesthesia: General | Site: Spine Lumbar

## 2015-05-01 MED ORDER — ACETAMINOPHEN 650 MG RE SUPP: 650.0000 mg | RECTAL | Status: DC | PRN

## 2015-05-01 MED ORDER — EPHEDRINE SULFATE 50 MG/ML IJ SOLN
INTRAMUSCULAR | Status: AC
  Filled 2015-05-01: qty 2

## 2015-05-01 MED ORDER — SUCCINYLCHOLINE CHLORIDE 20 MG/ML IJ SOLN
INTRAMUSCULAR | Status: AC
  Filled 2015-05-01: qty 1

## 2015-05-01 MED ORDER — FLUTICASONE PROPIONATE 50 MCG/ACT NA SUSP
1.0000 | Freq: Every day | NASAL | Status: DC
  Administered 2015-05-02: 1 via NASAL
  Filled 2015-05-01: qty 16

## 2015-05-01 MED ORDER — SODIUM CHLORIDE 0.9 % IJ SOLN: 3.0000 mL | Freq: Two times a day (BID) | INTRAMUSCULAR | Status: DC

## 2015-05-01 MED ORDER — BUDESONIDE-FORMOTEROL FUMARATE 160-4.5 MCG/ACT IN AERO
2.0000 | INHALATION_SPRAY | Freq: Two times a day (BID) | RESPIRATORY_TRACT | Status: DC
  Administered 2015-05-01 – 2015-05-02 (×2): 2 via RESPIRATORY_TRACT
  Filled 2015-05-01: qty 6

## 2015-05-01 MED ORDER — VANCOMYCIN HCL 1000 MG IV SOLR
INTRAVENOUS | Status: DC | PRN
  Administered 2015-05-01: 1000 mg via TOPICAL

## 2015-05-01 MED ORDER — HYDROMORPHONE HCL 1 MG/ML IJ SOLN
INTRAMUSCULAR | Status: AC
  Filled 2015-05-01: qty 1

## 2015-05-01 MED ORDER — HYDROCODONE-ACETAMINOPHEN 5-325 MG PO TABS: 1.0000 | ORAL_TABLET | ORAL | Status: DC | PRN

## 2015-05-01 MED ORDER — 0.9 % SODIUM CHLORIDE (POUR BTL) OPTIME
TOPICAL | Status: DC | PRN
  Administered 2015-05-01: 1000 mL

## 2015-05-01 MED ORDER — HYDROMORPHONE HCL 1 MG/ML IJ SOLN
0.5000 mg | INTRAMUSCULAR | Status: DC | PRN
  Administered 2015-05-01 – 2015-05-02 (×8): 1 mg via INTRAVENOUS
  Filled 2015-05-01 (×7): qty 1

## 2015-05-01 MED ORDER — MIDAZOLAM HCL 2 MG/2ML IJ SOLN
INTRAMUSCULAR | Status: AC
  Filled 2015-05-01: qty 4

## 2015-05-01 MED ORDER — FENTANYL CITRATE (PF) 250 MCG/5ML IJ SOLN
INTRAMUSCULAR | Status: AC
  Filled 2015-05-01: qty 5

## 2015-05-01 MED ORDER — LACTATED RINGERS IV SOLN
INTRAVENOUS | Status: DC | PRN
  Administered 2015-05-01 (×2): via INTRAVENOUS

## 2015-05-01 MED ORDER — PHENYLEPHRINE 40 MCG/ML (10ML) SYRINGE FOR IV PUSH (FOR BLOOD PRESSURE SUPPORT)
PREFILLED_SYRINGE | INTRAVENOUS | Status: AC
  Filled 2015-05-01: qty 10

## 2015-05-01 MED ORDER — SODIUM CHLORIDE 0.9 % IV SOLN
250.0000 mL | INTRAVENOUS | Status: DC
Start: 2015-05-01 — End: 2015-05-03

## 2015-05-01 MED ORDER — ONDANSETRON HCL 4 MG/2ML IJ SOLN
INTRAMUSCULAR | Status: DC | PRN
  Administered 2015-05-01: 4 mg via INTRAVENOUS

## 2015-05-01 MED ORDER — HYDROMORPHONE HCL 1 MG/ML IJ SOLN
0.2500 mg | INTRAMUSCULAR | Status: DC | PRN
  Administered 2015-05-01 (×2): 0.5 mg via INTRAVENOUS

## 2015-05-01 MED ORDER — PROPOFOL 10 MG/ML IV BOLUS
INTRAVENOUS | Status: AC
  Filled 2015-05-01: qty 20

## 2015-05-01 MED ORDER — PHENYLEPHRINE HCL 10 MG/ML IJ SOLN
INTRAMUSCULAR | Status: DC | PRN
  Administered 2015-05-01: 40 ug via INTRAVENOUS
  Administered 2015-05-01 (×3): 80 ug via INTRAVENOUS

## 2015-05-01 MED ORDER — ALPRAZOLAM 0.25 MG PO TABS
0.2500 mg | ORAL_TABLET | Freq: Two times a day (BID) | ORAL | Status: DC | PRN
Start: 2015-05-01 — End: 2015-05-03

## 2015-05-01 MED ORDER — TRIAMTERENE-HCTZ 37.5-25 MG PO TABS
1.0000 | ORAL_TABLET | Freq: Every day | ORAL | Status: DC
  Administered 2015-05-01 – 2015-05-03 (×3): 1 via ORAL
  Filled 2015-05-01 (×3): qty 1

## 2015-05-01 MED ORDER — VITAMIN B-12 1000 MCG PO TABS
1000.0000 ug | ORAL_TABLET | Freq: Every day | ORAL | Status: DC
  Administered 2015-05-01 – 2015-05-03 (×3): 1000 ug via ORAL
  Filled 2015-05-01 (×3): qty 1

## 2015-05-01 MED ORDER — LIDOCAINE HCL (CARDIAC) 20 MG/ML IV SOLN
INTRAVENOUS | Status: AC
  Filled 2015-05-01: qty 5

## 2015-05-01 MED ORDER — MAGNESIUM GLUCONATE 500 MG PO TABS
250.0000 mg | ORAL_TABLET | Freq: Every day | ORAL | Status: DC
  Administered 2015-05-02 – 2015-05-03 (×2): 250 mg via ORAL
  Filled 2015-05-01 (×2): qty 1

## 2015-05-01 MED ORDER — ROCURONIUM BROMIDE 100 MG/10ML IV SOLN
INTRAVENOUS | Status: DC | PRN
  Administered 2015-05-01: 30 mg via INTRAVENOUS
  Administered 2015-05-01: 50 mg via INTRAVENOUS

## 2015-05-01 MED ORDER — PHENYLEPHRINE HCL 10 MG/ML IJ SOLN
INTRAMUSCULAR | Status: AC
  Filled 2015-05-01: qty 1

## 2015-05-01 MED ORDER — SCOPOLAMINE 1 MG/3DAYS TD PT72
MEDICATED_PATCH | TRANSDERMAL | Status: AC
  Filled 2015-05-01: qty 1

## 2015-05-01 MED ORDER — SODIUM CHLORIDE 0.9 % IR SOLN
Status: DC | PRN
  Administered 2015-05-01: 09:00:00

## 2015-05-01 MED ORDER — ONDANSETRON HCL 4 MG/2ML IJ SOLN: 4.0000 mg | INTRAMUSCULAR | Status: DC | PRN

## 2015-05-01 MED ORDER — SUGAMMADEX SODIUM 500 MG/5ML IV SOLN
INTRAVENOUS | Status: DC | PRN
  Administered 2015-05-01: 200 mg via INTRAVENOUS

## 2015-05-01 MED ORDER — PROMETHAZINE HCL 25 MG/ML IJ SOLN: 6.2500 mg | INTRAMUSCULAR | Status: DC | PRN

## 2015-05-01 MED ORDER — SODIUM CHLORIDE 0.9 % IV SOLN
10.0000 mg | INTRAVENOUS | Status: DC | PRN
  Administered 2015-05-01: 15 ug/min via INTRAVENOUS

## 2015-05-01 MED ORDER — ALBUTEROL SULFATE (2.5 MG/3ML) 0.083% IN NEBU: 3.0000 mL | INHALATION_SOLUTION | RESPIRATORY_TRACT | Status: DC | PRN

## 2015-05-01 MED ORDER — TURMERIC 500 MG PO CAPS: 500.0000 mg | ORAL_CAPSULE | Freq: Every day | ORAL | Status: DC

## 2015-05-01 MED ORDER — PHENOL 1.4 % MT LIQD: 1.0000 | OROMUCOSAL | Status: DC | PRN

## 2015-05-01 MED ORDER — MAGNESIUM 200 MG PO TABS
250.0000 mg | ORAL_TABLET | Freq: Every day | ORAL | Status: DC
  Filled 2015-05-01: qty 2

## 2015-05-01 MED ORDER — VANCOMYCIN HCL 1000 MG IV SOLR
INTRAVENOUS | Status: AC
  Filled 2015-05-01: qty 1000

## 2015-05-01 MED ORDER — ACETAMINOPHEN 325 MG PO TABS: 650.0000 mg | ORAL_TABLET | ORAL | Status: DC | PRN

## 2015-05-01 MED ORDER — PROPOFOL 10 MG/ML IV BOLUS
INTRAVENOUS | Status: DC | PRN
  Administered 2015-05-01: 200 mg via INTRAVENOUS

## 2015-05-01 MED ORDER — CEFAZOLIN SODIUM 1-5 GM-% IV SOLN
1.0000 g | Freq: Three times a day (TID) | INTRAVENOUS | Status: AC
  Administered 2015-05-01 (×2): 1 g via INTRAVENOUS
  Filled 2015-05-01 (×2): qty 50

## 2015-05-01 MED ORDER — FENTANYL CITRATE (PF) 100 MCG/2ML IJ SOLN
INTRAMUSCULAR | Status: DC | PRN
  Administered 2015-05-01 (×3): 50 ug via INTRAVENOUS

## 2015-05-01 MED ORDER — SCOPOLAMINE 1 MG/3DAYS TD PT72
MEDICATED_PATCH | TRANSDERMAL | Status: DC | PRN
  Administered 2015-05-01: 1 via TRANSDERMAL

## 2015-05-01 MED ORDER — THROMBIN 20000 UNITS EX SOLR
CUTANEOUS | Status: DC | PRN
  Administered 2015-05-01: 09:00:00 via TOPICAL

## 2015-05-01 MED ORDER — VITAMIN D3 25 MCG (1000 UNIT) PO TABS
1000.0000 [IU] | ORAL_TABLET | Freq: Every day | ORAL | Status: DC
Start: 2015-05-01 — End: 2015-05-03
  Administered 2015-05-01 – 2015-05-03 (×3): 1000 [IU] via ORAL
  Filled 2015-05-01 (×3): qty 1

## 2015-05-01 MED ORDER — MIDAZOLAM HCL 5 MG/5ML IJ SOLN
INTRAMUSCULAR | Status: DC | PRN
  Administered 2015-05-01: 2 mg via INTRAVENOUS

## 2015-05-01 MED ORDER — LIDOCAINE HCL (CARDIAC) 20 MG/ML IV SOLN
INTRAVENOUS | Status: DC | PRN
  Administered 2015-05-01: 100 mg via INTRAVENOUS

## 2015-05-01 MED ORDER — SENNA-DOCUSATE SODIUM 8.6-50 MG PO TABS
1.0000 | ORAL_TABLET | Freq: Every day | ORAL | Status: DC
Start: 2015-05-02 — End: 2015-05-03
  Administered 2015-05-02 – 2015-05-03 (×2): 1 via ORAL
  Filled 2015-05-01 (×3): qty 1

## 2015-05-01 MED ORDER — OXYCODONE-ACETAMINOPHEN 5-325 MG PO TABS
1.0000 | ORAL_TABLET | ORAL | Status: DC | PRN
  Administered 2015-05-01 – 2015-05-03 (×8): 2 via ORAL
  Filled 2015-05-01 (×8): qty 2

## 2015-05-01 MED ORDER — LACTATED RINGERS IV SOLN
INTRAVENOUS | Status: DC | PRN
Start: 2015-05-01 — End: 2015-05-01
  Administered 2015-05-01: 08:00:00 via INTRAVENOUS

## 2015-05-01 MED ORDER — MUPIROCIN 2 % EX OINT
1.0000 "application " | TOPICAL_OINTMENT | Freq: Two times a day (BID) | CUTANEOUS | Status: DC
  Administered 2015-05-01 – 2015-05-02 (×3): 1 via NASAL

## 2015-05-01 MED ORDER — IPRATROPIUM-ALBUTEROL 0.5-2.5 (3) MG/3ML IN SOLN: 3.0000 mL | RESPIRATORY_TRACT | Status: DC | PRN

## 2015-05-01 MED ORDER — ROCURONIUM BROMIDE 50 MG/5ML IV SOLN
INTRAVENOUS | Status: AC
  Filled 2015-05-01: qty 1

## 2015-05-01 MED ORDER — SUGAMMADEX SODIUM 200 MG/2ML IV SOLN
INTRAVENOUS | Status: AC
  Filled 2015-05-01: qty 2

## 2015-05-01 MED ORDER — SODIUM CHLORIDE 0.9 % IJ SOLN: 3.0000 mL | INTRAMUSCULAR | Status: DC | PRN

## 2015-05-01 MED ORDER — DIAZEPAM 5 MG PO TABS
5.0000 mg | ORAL_TABLET | Freq: Four times a day (QID) | ORAL | Status: DC | PRN
  Administered 2015-05-01 – 2015-05-02 (×3): 5 mg via ORAL
  Administered 2015-05-02 – 2015-05-03 (×3): 10 mg via ORAL
  Filled 2015-05-01: qty 1
  Filled 2015-05-01 (×3): qty 2
  Filled 2015-05-01 (×2): qty 1

## 2015-05-01 MED ORDER — MENTHOL 3 MG MT LOZG: 1.0000 | LOZENGE | OROMUCOSAL | Status: DC | PRN

## 2015-05-01 MED FILL — Heparin Sodium (Porcine) Inj 1000 Unit/ML: INTRAMUSCULAR | Qty: 30 | Status: AC

## 2015-05-01 MED FILL — Sodium Chloride IV Soln 0.9%: INTRAVENOUS | Qty: 1000 | Status: AC

## 2015-05-01 SURGICAL SUPPLY — 54 items
BAG DECANTER FOR FLEXI CONT (MISCELLANEOUS) ×3 IMPLANT
BENZOIN TINCTURE PRP APPL 2/3 (GAUZE/BANDAGES/DRESSINGS) ×3 IMPLANT
BLADE CLIPPER SURG (BLADE) IMPLANT
BRUSH SCRUB EZ PLAIN DRY (MISCELLANEOUS) ×3 IMPLANT
BUR CUTTER 7.0 ROUND (BURR) ×3 IMPLANT
BUR MATCHSTICK NEURO 3.0 LAGG (BURR) ×3 IMPLANT
CAGE CAPSTONE 10X22X6 (Cage) ×6 IMPLANT
CANISTER SUCT 3000ML PPV (MISCELLANEOUS) ×3 IMPLANT
CAP LCK SPNE (Orthopedic Implant) ×4 IMPLANT
CAP LOCK SPINE RADIUS (Orthopedic Implant) ×4 IMPLANT
CAP LOCKING (Orthopedic Implant) ×8 IMPLANT
CLOSURE WOUND 1/2 X4 (GAUZE/BANDAGES/DRESSINGS) ×1
CONT SPEC 4OZ CLIKSEAL STRL BL (MISCELLANEOUS) ×3 IMPLANT
COVER BACK TABLE 60X90IN (DRAPES) ×3 IMPLANT
DECANTER SPIKE VIAL GLASS SM (MISCELLANEOUS) IMPLANT
DRAPE C-ARM 42X72 X-RAY (DRAPES) ×6 IMPLANT
DRAPE LAPAROTOMY 100X72X124 (DRAPES) ×3 IMPLANT
DRAPE POUCH INSTRU U-SHP 10X18 (DRAPES) ×3 IMPLANT
DRAPE PROXIMA HALF (DRAPES) IMPLANT
DRAPE SURG 17X23 STRL (DRAPES) ×12 IMPLANT
DRSG OPSITE POSTOP 4X6 (GAUZE/BANDAGES/DRESSINGS) ×3 IMPLANT
DURAPREP 26ML APPLICATOR (WOUND CARE) ×3 IMPLANT
ELECT REM PT RETURN 9FT ADLT (ELECTROSURGICAL) ×3
ELECTRODE REM PT RTRN 9FT ADLT (ELECTROSURGICAL) ×1 IMPLANT
EVACUATOR 1/8 PVC DRAIN (DRAIN) IMPLANT
GAUZE SPONGE 4X4 12PLY STRL (GAUZE/BANDAGES/DRESSINGS) ×3 IMPLANT
GAUZE SPONGE 4X4 16PLY XRAY LF (GAUZE/BANDAGES/DRESSINGS) IMPLANT
GLOVE ECLIPSE 9.0 STRL (GLOVE) ×9 IMPLANT
GOWN STRL REUS W/ TWL LRG LVL3 (GOWN DISPOSABLE) ×2 IMPLANT
GOWN STRL REUS W/ TWL XL LVL3 (GOWN DISPOSABLE) ×3 IMPLANT
GOWN STRL REUS W/TWL 2XL LVL3 (GOWN DISPOSABLE) IMPLANT
GOWN STRL REUS W/TWL LRG LVL3 (GOWN DISPOSABLE) ×4
GOWN STRL REUS W/TWL XL LVL3 (GOWN DISPOSABLE) ×6
KIT BASIN OR (CUSTOM PROCEDURE TRAY) ×3 IMPLANT
KIT ROOM TURNOVER OR (KITS) ×3 IMPLANT
LIQUID BAND (GAUZE/BANDAGES/DRESSINGS) ×3 IMPLANT
NEEDLE HYPO 22GX1.5 SAFETY (NEEDLE) ×3 IMPLANT
NS IRRIG 1000ML POUR BTL (IV SOLUTION) ×3 IMPLANT
PACK LAMINECTOMY NEURO (CUSTOM PROCEDURE TRAY) ×3 IMPLANT
ROD RADIUS 40MM (Neuro Prosthesis/Implant) ×4 IMPLANT
ROD SPNL 40X5.5XNS TI RDS (Neuro Prosthesis/Implant) ×2 IMPLANT
SCREW 5.75 X 635 (Screw) ×3 IMPLANT
SCREW 5.75X40M (Screw) ×9 IMPLANT
SPONGE SURGIFOAM ABS GEL 100 (HEMOSTASIS) ×3 IMPLANT
STRIP CLOSURE SKIN 1/2X4 (GAUZE/BANDAGES/DRESSINGS) ×2 IMPLANT
SUT VIC AB 0 CT1 18XCR BRD8 (SUTURE) ×1 IMPLANT
SUT VIC AB 0 CT1 8-18 (SUTURE) ×2
SUT VIC AB 2-0 CT1 18 (SUTURE) ×3 IMPLANT
SUT VIC AB 3-0 SH 8-18 (SUTURE) ×3 IMPLANT
SYR 20ML ECCENTRIC (SYRINGE) ×3 IMPLANT
TOWEL OR 17X24 6PK STRL BLUE (TOWEL DISPOSABLE) ×3 IMPLANT
TOWEL OR 17X26 10 PK STRL BLUE (TOWEL DISPOSABLE) ×3 IMPLANT
TRAY FOLEY W/METER SILVER 14FR (SET/KITS/TRAYS/PACK) ×3 IMPLANT
WATER STERILE IRR 1000ML POUR (IV SOLUTION) ×3 IMPLANT

## 2015-05-01 NOTE — H&P (Signed)
Melissa Allen is an 49 y.o. female.   Chief Complaint: Back pain HPI: 48 year old female with progressive lower back pain with intermittent radiation into her lower extremities. Patient is status post previous right-sided L4-5 laminotomy and microdiscectomy patient has disc degeneration and a degenerative grade 1 spondylolisthesis at L4-5. She is failed conservative management and presents now for decompression and fusion.  Past Medical History  Diagnosis Date  . Allergy   . Calcaneal fracture   . Obesity (BMI 30.0-34.9)   . Asthma     recent admit to ER 01/11/12 for exac of asthma  . Hypertension   . Fibromyalgia   . Anginal pain     one episode 4-5 years ago-[facility]-nonspecific-no problems since-  . PONV (postoperative nausea and vomiting)   . Kidney stones   . Duplicated ureter, right   . Poor venous access     hard to start iv-  . Wears glasses   . Shortness of breath dyspnea   . Anxiety   . 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 Arthritis     multiple areas of arthritis- DDD  . Pancreatitis 1986    Past Surgical History  Procedure Laterality Date  . Tonsillectomy  age 70  . Bunionectomy  1995    Right  . Cholecystectomy  late 1980's       . Lumbar laminectomy  10/06    L4-5, Ray  . Knee surgery Left     x 2, Murphy/Sue, corrected Patella displacement   . R compressed pronator Right     Sypher, R arm  . Tendon repair  2013  . Tendon repair Right 02/01/12  . Bladder suspension  03/01/2012    Procedure: TRANSVAGINAL TAPE (TVT) PROCEDURE;  Surgeon: Emily Filbert, MD;  Location: [facility];  Service: Gynecology;  Laterality: N/A;  . Cystoscopy  03/01/2012    Procedure: CYSTOSCOPY;  Surgeon: Emily Filbert, MD;  Location: [facility];  Service: Gynecology;  Laterality: N/A;  . Abdominal hysterectomy    . Incision and drainage abscess Left 07/19/2013    Procedure: INCISION AND DRAINAGE LEFT LOWER EXTERMITY  HEMATOMA;  Surgeon: Imogene Burn. Georgette Dover, MD;  Location: [facility];  Service: General;  Laterality: Left;  Excision left lower leg mass  . Cystoscopy w/ ureteral stent placement Left 12/15/2014    Procedure: CYSTOSCOPY, LEFT URETEROSCOPYU WITH STONE EXTRACTION;  Surgeon: Irine Seal, MD;  Location: [facility];  Service: Urology;  Laterality: Left;    Family History  Problem Relation Age of Onset  . Heart disease Father   . Colon polyps Father   . Colon cancer Paternal Grandfather   . Colon cancer Maternal Grandmother   . Uterine cancer      Grandmother  . Breast cancer      Grandmother  . Ovarian cancer      Grandmother  . Diabetes      mother  . Other Neg Hx   . Cancer Mother     pancreatic cancer   Social History:  reports that she has never smoked. She has never used smokeless tobacco. She reports that she drinks about 3.5 oz of alcohol per week. She reports that she does not use illicit drugs.  Allergies:  Allergies  Allergen Reactions  . Lisinopril Cough       . Codeine Nausea And Vomiting  . Hydrocodone Itching    Causes  minor itching/ pt is currently taking (04/27/15)  . Tape Rash    Irritation to inside of thigh, /w PINK tape, can tolerate paper tape     Medications Prior to Admission  Medication Sig Dispense Refill  . ALPRAZolam (XANAX) 0.25 MG tablet TAKE ONE OR TWO TABLETS PER DAY --MUST BE SEEN FOR ADDITIONAL REFILLS (Patient taking differently: Take 0.25 mg by mouth 2 (two) times daily as needed for anxiety. Must be seen for additional refills) 60 tablet 0  . cholecalciferol (VITAMIN D) 1000 UNITS tablet Take 1,000 Units by mouth daily.    . cyclobenzaprine (FLEXERIL) 10 MG tablet Take 10 mg by mouth See admin instructions. Take 1 tablet (10 mg) daily at bedtime, may take an additional tablet during the day as needed for muscle spasms    . DULoxetine (CYMBALTA) 60 MG capsule TAKE ONE CAPSULE BY MOUTH EVERY NIGHT ATBEDTIME *OFFICE VISIT REQUIRED FOR  ADDITIONAL REFILLS* (Patient taking differently: TAKE ONE CAPSULE BY MOUTH EVERY NIGHT AT BEDTIME *OFFICE VISIT REQUIRED FOR ADDITIONAL REFILLS*) 90 capsule 0  . fluticasone (FLONASE) 50 MCG/ACT nasal spray USE 2 SPRAYS IN EACH NOSTRIL ONCE DAILY AS NEEDED. FOR ALLERGIES (Patient taking differently: USE 2 SPRAYS IN EACH NOSTRIL ONCE DAILY FOR ALLERGIES) 16 g 5  . HYDROcodone-acetaminophen (NORCO) 10-325 MG per tablet Take 2 tablets by mouth every 4 (four) hours as needed for moderate pain or severe pain.     . Magnesium 250 MG TABS Take 250 mg by mouth daily.    . sennosides-docusate sodium (SENOKOT-S) 8.6-50 MG tablet Take 1 tablet by mouth daily before breakfast.    . SYMBICORT 160-4.5 MCG/ACT inhaler INHALE 2 PUFFS EVERY 12 HOURS TO PREVENTCOUGH OR WHEEZING--RINSE, GARGLE, & SPITAFTER EACH USE. (Patient taking differently: INHALE 2 PUFFS EVERY MORNING) 10.2 g 3  . triamterene-hydrochlorothiazide (MAXZIDE-25) 37.5-25 MG per tablet TAKE 1 TABLET BY MOUTH DAILY 90 tablet 1  . Turmeric 500 MG CAPS Take 500 mg by mouth daily.    . vitamin B-12 (CYANOCOBALAMIN) 1000 MCG tablet Take 1,000 mcg by mouth daily.    Marland Kitchen zolpidem (AMBIEN) 10 MG tablet TAKE 1 TABLET BY MOUTH EVERY NIGHT AT BEDTIME AS NEEDED. (Patient taking differently: TAKE 1 TABLET BY MOUTH DAILY AT BEDTIME) 30 tablet 0  . chlorpheniramine (CHLOR-TRIMETON) 4 MG tablet Take 4 mg by mouth 2 (two) times daily as needed for allergies.     Marland Kitchen ipratropium-albuterol (DUONEB) 0.5-2.5 (3) MG/3ML SOLN Take 3 mLs by nebulization every 4 (four) hours as needed. (Patient taking differently: Take 3 mLs by nebulization every 4 (four) hours as needed (shortness of breath/wheezing). ) 360 mL 1  . PROAIR HFA 108 (90 BASE) MCG/ACT inhaler INHALE TWO PUFFS EVERY FOUR HOURS AS NEEDED SHORTNESS OF BREATH 8.5 g 2    Results for orders placed or performed during the hospital encounter of 04/30/15 (from the past 48 hour(s))  Basic metabolic panel     Status: Abnormal    Collection Time: 04/30/15 12:27 PM  Result Value Ref Range   Sodium 138 135 - 145 mmol/L   Potassium 4.0 3.5 - 5.1 mmol/L    Comment: SPECIMEN HEMOLYZED. HEMOLYSIS MAY AFFECT INTEGRITY OF RESULTS.   Chloride 102 101 - 111 mmol/L   CO2 27 22 - 32 mmol/L   Glucose, Bld 101 (H) 65 - 99 mg/dL   BUN 8 6 - 20 mg/dL   Creatinine, Ser 0.73 0.44 - 1.00 mg/dL   Calcium 9.6 8.9 - 10.3 mg/dL   GFR calc  non Af Amer >60 >60 mL/min   GFR calc Af Amer >60 >60 mL/min    Comment: (NOTE) The eGFR has been calculated using the CKD EPI equation. This calculation has not been validated in all clinical situations. eGFR's persistently <60 mL/min signify possible Chronic Kidney Disease.    Anion gap 9 5 - 15  CBC WITH DIFFERENTIAL     Status: None   Collection Time: 04/30/15 12:27 PM  Result Value Ref Range   WBC 5.9 4.0 - 10.5 K/uL   RBC 4.75 3.87 - 5.11 MIL/uL   Hemoglobin 14.7 12.0 - 15.0 g/dL   HCT 43.5 36.0 - 46.0 %   MCV 91.6 78.0 - 100.0 fL   MCH 30.9 26.0 - 34.0 pg   MCHC 33.8 30.0 - 36.0 g/dL   RDW 13.4 11.5 - 15.5 %   Platelets 215 150 - 400 K/uL   Neutrophils Relative % 55 43 - 77 %   Neutro Abs 3.2 1.7 - 7.7 K/uL   Lymphocytes Relative 34 12 - 46 %   Lymphs Abs 2.0 0.7 - 4.0 K/uL   Monocytes Relative 7 3 - 12 %   Monocytes Absolute 0.4 0.1 - 1.0 K/uL   Eosinophils Relative 3 0 - 5 %   Eosinophils Absolute 0.2 0.0 - 0.7 K/uL   Basophils Relative 1 0 - 1 %   Basophils Absolute 0.1 0.0 - 0.1 K/uL  Surgical pcr screen     Status: Abnormal   Collection Time: 04/30/15 12:27 PM  Result Value Ref Range   MRSA, PCR NEGATIVE NEGATIVE   Staphylococcus aureus POSITIVE (A) NEGATIVE    Comment:        The Xpert SA Assay (FDA approved for NASAL specimens in patients over 28 years of age), is one component of a comprehensive surveillance program.  Test performance has been validated by [facility] for patients greater than or equal to 1 year old. It is not intended to diagnose infection  nor to guide or monitor treatment.   Type and screen     Status: None   Collection Time: 04/30/15 12:35 PM  Result Value Ref Range   ABO/RH(D) AB POS    Antibody Screen NEG    Sample Expiration 05/14/2015   ABO/Rh     Status: None   Collection Time: 04/30/15 12:35 PM  Result Value Ref Range   ABO/RH(D) AB POS    No results found.    Blood pressure 121/72, pulse 114, temperature 97.3 F (36.3 C), resp. rate 20, weight 89.359 kg (197 lb), last menstrual period 12/07/2011, SpO2 97 %. awake and alert. Oriented and appropriate. Cranial nerve function intact. Motor and sensory function of the extremities normal. Reflexes hypoactive in both lower extremities. Straight raising equivocal. Gait and posture normal. Wound clean and dry. Chest and abdomen benign. Extremities free from injury or deformity.     Assessment/Plan L4-5 degenerative grade 1 spondylolisthesis with foraminal stenosis and intractable back pain. Plan L4-5 posterior lumbar interbody fusion utilizing interbody peek cages and local autograft coupled with posterior lateral arthrodesis utilizing nonsegmental pedicle screw instrumentation. Risks and benefits of been explained. Patient wishes to proceed.   Jahlil Ziller A 05/01/2015, 7:44 AM

## 2015-05-01 NOTE — Anesthesia Preprocedure Evaluation (Signed)
Anesthesia Evaluation  Patient identified by MRN, date of birth, ID band Patient awake    Reviewed: Allergy & Precautions, NPO status , Patient's Chart, lab work & pertinent test results  History of Anesthesia Complications (+) PONV and history of anesthetic complications  Airway Mallampati: II  TM Distance: >3 FB Neck ROM: Full    Dental  (+) Teeth Intact, Dental Advisory Given   Pulmonary shortness of breath, asthma ,    Pulmonary exam normal       Cardiovascular hypertension, Normal cardiovascular exam    Neuro/Psych Seizures -, Well Controlled,  PSYCHIATRIC DISORDERS Anxiety Depression  Neuromuscular disease    GI/Hepatic Neg liver ROS, GERD-  ,  Endo/Other  negative endocrine ROS  Renal/GU      Musculoskeletal   Abdominal   Peds  Hematology negative hematology ROS (+)   Anesthesia Other Findings   Reproductive/Obstetrics                             Anesthesia Physical Anesthesia Plan  ASA: III  Anesthesia Plan: General   Post-op Pain Management:    Induction: Intravenous  Airway Management Planned: Oral ETT  Additional Equipment:   Intra-op Plan:   Post-operative Plan: Extubation in OR  Informed Consent: I have reviewed the patients History and Physical, chart, labs and discussed the procedure including the risks, benefits and alternatives for the proposed anesthesia with the patient or authorized representative who has indicated his/her understanding and acceptance.   Dental advisory given  Plan Discussed with: CRNA, Anesthesiologist and Surgeon  Anesthesia Plan Comments:         Anesthesia Quick Evaluation

## 2015-05-01 NOTE — Op Note (Signed)
Date of procedure: 05/01/2015  Date of dictation: Same  Service: Neurosurgery  Preoperative diagnosis: L4-5 grade 1 degenerative spondylolisthesis with stenosis and radiculopathy  Postoperative diagnosis: Same  Procedure Name: L4-L5 decompressive laminectomy, facetectomies and foraminotomies (Gill procedure), more than would be required for simple interbody fusion alone.  L4-5 posterior lumbar interbody fusion utilizing interbody peek cages and locally harvested autograft  L4-5 posterior lateral arthrodesis utilizing nonsegmental pedicle screw instrumentation and local autograft  Surgeon:Nellie Pester A.Conn Trombetta, M.D.  Asst. Surgeon: Kathyrn Sheriff  Anesthesia: General  Indication: 49 year old female status post previous right-sided L4-5 laminotomy microdiscectomy remotely in the past. Patient also status post left-sided L4-5 extraforaminal microdiscectomy presents now with worsening back pain and radicular symptoms. Workup demonstrates evidence of a progressive unstable grade 1 degenerative spondylolisthesis with marked foraminal stenosis. Patient presents now for decompression and fusion in hopes of improving her symptoms.  Operative note: After induction of anesthesia, patient position prone onto Wilson frame and a properly padded. Lumbar region prepped and draped. Incision performed overlying L4-5. Dissection performed. Retractor placed. Fluoroscopy used. Levels confirmed. Previous laminotomy on the right at L4-5 dissected free. Complete decompressive laminectomy with removal of the entire L4 lamina and bilateral L4 facet joints was performed bilaterally. Superior facetectomies of L5 were performed bilaterally. Ligament flavum and epidural scar were elevated and resected. Decompressive foraminotomies were completed on the course the exiting L4 and L5 nerve roots bilaterally. Bilateral discectomy was were then performed at L4-5. Disc space then distracted on the left-sided L4-5. With the disc space  distracted on the left disc space was reamed and scraped with various curettes. Soft tissue was removed from the interspace. A 10 mm x 23 mm x 6 capstone implant was packed with locally harvested autograft. This was impacted into position and rotated into final place. Distractor was removed patient's contralateral side. Once again the disc space was prepared. Soft tissue was removed and interspace. Morselize autograft packed into the interspace. A second cage was impacted in position and rotated into final place. Pedicles of L4 and L5 were done 5 using surface landmarks and intraoperative fluoroscopy. Superficial bone was removed overlying the pedicle. Each pedicle was then probed using a pedicle awl each pedicle awl track was then tapped with a screw tap. Each screw tap was probed and found to be solidly within the bone. 5.75 x 40 mm radius brand screws from [employer] were placed at L4 and 35 mm screw was placed on the right at L4-5 and 40 mm screws placed in the left at L5. Transverse processes and residual facet joint were decorticated using high-speed drill. Morselized autograft was packed posterior laterally. Short segment of titanium rods and placed over the screw heads at L4 and L5. Locking caps and placed over the screws. Locking caps were then engaged with the construct under compression. Final images revealed good position the bone graft and hardware at proper upper level with normal alignment of the spine. Wound is then irrigated one final time. Vancomycin powder was placed the deep wound space as well as Gelfoam. Wound is then closed in a typical fashion. Steri-Strips and sterile dressing were applied. No apparent complications. Patient tolerated the procedure well and she returns to the recovery room postop.

## 2015-05-01 NOTE — Anesthesia Procedure Notes (Addendum)
Procedure Name: Intubation Date/Time: 05/01/2015 8:02 AM Performed by: Rogers Blocker Pre-anesthesia Checklist: Patient identified, Timeout performed, Emergency Drugs available, Suction available and Patient being monitored Patient Re-evaluated:Patient Re-evaluated prior to inductionOxygen Delivery Method: Circle system utilized Preoxygenation: Pre-oxygenation with 100% oxygen Intubation Type: IV induction Ventilation: Mask ventilation without difficulty Laryngoscope Size: Mac and 3 Grade View: Grade II Tube type: Oral Tube size: 7.0 mm Number of attempts: 1 Airway Equipment and Method: Stylet Placement Confirmation: ETT inserted through vocal cords under direct vision,  breath sounds checked- equal and bilateral,  positive ETCO2 and CO2 detector Secured at: 21 cm Tube secured with: Tape Dental Injury: Teeth and Oropharynx as per pre-operative assessment  Comments: Intubation by Harless Nakayama, SRNA

## 2015-05-01 NOTE — Evaluation (Signed)
Occupational Therapy Evaluation Patient Details Name: Melissa Allen MRN: [id number] DOB: May 14, 1966 Today's Date: 05/01/2015    History of Present Illness 49 y.o. s/p LUMBAR FOUR-FIVE POSTERIOR LUMBAR INTERBODY FUSION. Pt has had a previous laminectomy and microdisectomy.   Clinical Impression   Pt s/p above. Pt independent with ADLs, PTA. Feel pt will benefit from acute OT to increase independence prior to d/c. Plan to practice LB dressing with AE next session and reinforce precautions.    Follow Up Recommendations  No OT follow up;Supervision - Intermittent    Equipment Recommendations  3 in 1 bedside comode;Other (comment) (AE)    Recommendations for Other Services       Precautions / Restrictions Precautions Precautions: Back;Fall Precaution Booklet Issued: Yes (comment) Precaution Comments: educated on back precautions Required Braces or Orthoses: Spinal Brace Spinal Brace: Lumbar corset;Applied in sitting position (adjusted in standing) Restrictions Weight Bearing Restrictions: No      Mobility Bed Mobility Overal bed mobility: Needs Assistance Bed Mobility: Rolling;Sidelying to Sit;Sit to Sidelying Rolling: Supervision;Modified independent (Device/Increase time) Sidelying to sit: Supervision     Sit to sidelying: Supervision General bed mobility comments: cues to prevent twisting.  Transfers Overall transfer level: Needs assistance   Transfers: Sit to/from Stand Sit to Stand: Min guard;Mod assist (Min guard-sit to stand;Min-Mod assist 2 person hand held assist-stand to sit to bed)              Balance      2 person hand held assist for ambulation-Mod assist.                                       ADL Overall ADL's : Needs assistance/impaired                 Upper Body Dressing : Minimal assistance;Sitting (adjusted brace in standing)   Lower Body Dressing: Maximal assistance;Sit to/from stand   Toilet Transfer: Moderate  assistance;Ambulation (2 person hand held assist for ambulation; sit to stand from bed-Min guard; Min-Mod assist 2 person hand held assist for stand to sit to bed)           Functional mobility during ADLs: Moderate assistance (+2 hand held assist) General ADL Comments: Discussed incorporating precautions into functional activities. Educated on back brace.  Educated on AE. Educated on positioning of pillows.     Vision     Perception     Praxis      Pertinent Vitals/Pain Pain Assessment: 0-10 Pain Score: 10-Worst pain ever Pain Location: back Pain Descriptors / Indicators: Aching;Sore Pain Intervention(s): Limited activity within patient's tolerance;Monitored during session;Patient requesting pain meds-RN notified;Repositioned     Hand Dominance Right   Extremity/Trunk Assessment Upper Extremity Assessment Upper Extremity Assessment: Overall WFL for tasks assessed   Lower Extremity Assessment Lower Extremity Assessment: Defer to PT evaluation       Communication Communication Communication: No difficulties   Cognition Arousal/Alertness: Awake/alert Behavior During Therapy: Anxious Overall Cognitive Status: Within Functional Limits for tasks assessed                     General Comments       Exercises       Shoulder Instructions      Home Living Family/patient expects to be discharged to:: Private residence Living Arrangements: Spouse/significant other (37 y.o. daughter there part of the time) Available Help at Discharge: Family;Available 24 hours/day  Type of Home: House Home Access: Stairs to enter [employer] of Steps: 1 Entrance Stairs-Rails: None Home Layout: Two level;Able to live on main level with bedroom/bathroom     Bathroom Shower/Tub: Occupational psychologist: Standard     Home Equipment: Adaptive equipment (has walker and cane-unsure of type) Adaptive Equipment: Reacher;Long-handled shoe horn        Prior  Functioning/Environment Level of Independence: Independent        Comments: reports daughter holds onto pt when stepping off curbs    OT Diagnosis: Acute pain   OT Problem List: Decreased strength;Decreased activity tolerance;Pain;Decreased knowledge of precautions;Decreased knowledge of use of DME or AE;Impaired balance (sitting and/or standing);Decreased range of motion   OT Treatment/Interventions: Self-care/ADL training;DME and/or AE instruction;Therapeutic activities;Balance training;Patient/family education    OT Goals(Current goals can be found in the care plan section) Acute Rehab OT Goals Patient Stated Goal: not stated OT Goal Formulation: With patient Time For Goal Achievement: 05/08/15 Potential to Achieve Goals: Good ADL Goals Pt Will Perform Lower Body Dressing: with set-up;with supervision;with adaptive equipment;sit to/from stand Pt Will Transfer to Toilet: with supervision;ambulating (3 in 1 over toilet) Pt Will Perform Toileting - Clothing Manipulation and hygiene: with set-up;sit to/from stand Additional ADL Goal #1: Pt will independently verbalize 3/3 back precautions and maintain during session.  OT Frequency: Min 2X/week   Barriers to D/C:            Co-evaluation              End of Session Equipment Utilized During Treatment: Gait belt;Rolling walker Nurse Communication: Patient requests pain meds (pain)  Activity Tolerance: Patient limited by pain Patient left: in bed;with call bell/phone within reach;with SCD's reapplied;with family/visitor present   Time: 0601-5615 OT Time Calculation (min): 19 min Charges:  OT General Charges $OT Visit: 1 Procedure OT Evaluation $Initial OT Evaluation Tier I: 1 Procedure G-CodesBenito Mccreedy OTR/L [id number] 05/01/2015, 2:26 PM

## 2015-05-01 NOTE — Anesthesia Postprocedure Evaluation (Signed)
Anesthesia Post Note  Patient: Melissa Allen  Procedure(s) Performed: Procedure(s) (LRB): LUMBAR FOUR-FIVE POSTERIOR LUMBAR INTERBODY FUSION (N/A)  Anesthesia type: general  Patient location: PACU  Post pain: Pain level controlled  Post assessment: Patient's Cardiovascular Status Stable  Last Vitals:  Filed Vitals:   05/01/15 1138  BP: 151/94  Pulse: 116  Temp: 36.9 C  Resp: 16    Post vital signs: Reviewed and stable  Level of consciousness: sedated  Complications: No apparent anesthesia complications

## 2015-05-01 NOTE — Transfer of Care (Signed)
Immediate Anesthesia Transfer of Care Note  Patient: Melissa Allen  Procedure(s) Performed: Procedure(s): LUMBAR FOUR-FIVE POSTERIOR LUMBAR INTERBODY FUSION (N/A)  Patient Location: PACU  Anesthesia Type:General  Level of Consciousness: patient cooperative and responds to stimulation  Airway & Oxygen Therapy: Patient Spontanous Breathing and Patient connected to face mask oxygen  Post-op Assessment: Report given to RN, Post -op Vital signs reviewed and stable and Patient moving all extremities  Post vital signs: Reviewed and stable  Last Vitals:  Filed Vitals:   05/01/15 0643  BP: 121/72  Pulse: 114  Temp: 36.3 C  Resp: 20    Complications: No apparent anesthesia complications

## 2015-05-01 NOTE — Brief Op Note (Signed)
05/01/2015  10:11 AM  PATIENT:  Melissa Allen  49 y.o. female  PRE-OPERATIVE DIAGNOSIS:  Spondylolisthesis  POST-OPERATIVE DIAGNOSIS:  Spondylolisthesis  PROCEDURE:  Procedure(s): LUMBAR FOUR-FIVE POSTERIOR LUMBAR INTERBODY FUSION (N/A)  SURGEON:  Surgeon(s) and Role:    * Earnie Larsson, MD - Primary    * Consuella Lose, MD - Assisting  PHYSICIAN ASSISTANT:   ASSISTANTS:    ANESTHESIA:   general  EBL:  Total I/O In: 1300 [I.V.:1300] Out: 300 [Urine:100; Blood:200]  BLOOD ADMINISTERED:none  DRAINS: none   LOCAL MEDICATIONS USED:  MARCAINE     SPECIMEN:  No Specimen  DISPOSITION OF SPECIMEN:    COUNTS:  YES  TOURNIQUET:  * No tourniquets in log *  DICTATION: .Dragon Dictation  PLAN OF CARE: Admit to inpatient   PATIENT DISPOSITION:  PACU - hemodynamically stable.   Delay start of Pharmacological VTE agent (>24hrs) due to surgical blood loss or risk of bleeding: yes

## 2015-05-02 MED ORDER — HYDROMORPHONE HCL 2 MG PO TABS
2.0000 mg | ORAL_TABLET | ORAL | Status: DC | PRN
  Administered 2015-05-02 – 2015-05-03 (×2): 2 mg via ORAL
  Filled 2015-05-02 (×2): qty 1

## 2015-05-02 NOTE — Progress Notes (Signed)
OT Cancellation Note  Patient Details Name: Melissa Allen MRN: [id number] DOB: March 02, 1966   Cancelled Treatment:    Reason Eval/Treat Not Completed: Patient declined, no reason specified;Other (comment) Pt resting and reports, "I had a really rough night and am just now able to rest." Pt requests OT come back later. OT to reattempt as schedule permits.  Hortencia Pilar 05/02/2015, 11:03 AM

## 2015-05-02 NOTE — Progress Notes (Signed)
Postop day 1.  Patient with quite a bit of lumbar pain. No radicular pain. Restless night secondary to pain. Up ambulating with physical therapy this morning.  Afebrile. Vitals are stable. Awake and alert. Oriented and appropriate. Motor and sensory intact. Dressing dry.  Progressing slowly following lumbar decompression and fusion. Patient requests additional day in the hospital for further pain control and mobilization. Hopefully discharge tomorrow.

## 2015-05-02 NOTE — Evaluation (Signed)
Physical Therapy Evaluation Patient Details Name: Melissa Allen MRN: [id number] DOB: 04-Jul-1966 Today's Date: 05/02/2015   History of Present Illness  49 y.o. s/p LUMBAR FOUR-FIVE POSTERIOR LUMBAR INTERBODY FUSION. Pt has had a previous laminectomy and microdisectomy.  Clinical Impression  Pt was seen for evaluation of bed mob and gait, noted pain is her greatest limitation and affecting her tolerance for gait distances.  Will plan to see tomorrow with expectation that she can go home afterward, and so will try a step with her.  Pt needs to walk with nursing later today if her pain and time allows.    Follow Up Recommendations Home health PT;Supervision/Assistance - 24 hour    Equipment Recommendations  None recommended by PT    Recommendations for Other Services       Precautions / Restrictions Precautions Precautions: Back;Fall Precaution Booklet Issued: Yes (comment) Precaution Comments: educated on back precautions Required Braces or Orthoses: Spinal Brace Spinal Brace: Lumbar corset;Other (comment);Applied in sitting position (instructed pt on application) Restrictions Weight Bearing Restrictions: No      Mobility  Bed Mobility Overal bed mobility: Needs Assistance Bed Mobility: Supine to Sit;Sit to Supine Rolling: Min guard Sidelying to sit: Min guard Supine to sit: Min guard Sit to supine: Min guard Sit to sidelying: Min guard    Transfers Overall transfer level: Needs assistance Equipment used: Rolling walker (2 wheeled);1 person hand held assist Transfers: Sit to/from [facility] Sit to Stand: Min guard Stand pivot transfers: Min guard       General transfer comment: Pt aware of hand placement  Ambulation/Gait Ambulation/Gait assistance: Min guard Ambulation Distance (Feet): 100 Feet Assistive device: Rolling walker (2 wheeled);1 person hand held assist Gait Pattern/deviations: Step-through pattern;Wide base of support Gait velocity:  reduced Gait velocity interpretation: Below normal speed for age/gender General Gait Details: has limited lifting of feet on floor but does clear feet  Stairs            Wheelchair Mobility    Modified Rankin (Stroke Patients Only)       Balance Overall balance assessment: Needs assistance Sitting-balance support: Feet supported Sitting balance-Leahy Scale: Fair   Postural control: Posterior lean Standing balance support: Bilateral upper extremity supported Standing balance-Leahy Scale: Poor                               Pertinent Vitals/Pain Pain Assessment: 0-10 Pain Score: 8  Pain Location: low back Pain Descriptors / Indicators: Operative site guarding Pain Intervention(s): Limited activity within patient's tolerance;Monitored during session;Premedicated before session;Repositioned    Home Living Family/patient expects to be discharged to:: Private residence Living Arrangements: Spouse/significant other Available Help at Discharge: Family;Available 24 hours/day Type of Home: House Home Access: Stairs to enter Entrance Stairs-Rails: None Entrance Stairs-Number of Steps: 1 Home Layout: Two level;Able to live on main level with bedroom/bathroom Home Equipment: Gilford Rile - 2 wheels;Cane - single point;Shower seat - built in      Prior Function Level of Independence: Independent         Comments: reports daughter holds onto pt when stepping off curbs     Hand Dominance   Dominant Hand: Right    Extremity/Trunk Assessment   Upper Extremity Assessment: Overall WFL for tasks assessed           Lower Extremity Assessment: Generalized weakness      Cervical / Trunk Assessment: Normal  Communication   Communication: No difficulties  Cognition Arousal/Alertness: Awake/alert Behavior During Therapy: WFL for tasks assessed/performed Overall Cognitive Status: Within Functional Limits for tasks assessed                       General Comments General comments (skin integrity, edema, etc.): Pt is having some significant pain even on meds and did work on review and positioning of back to get her comfortable on her side in bed    Exercises        Assessment/Plan    PT Assessment Patient needs continued PT services  PT Diagnosis Acute pain   PT Problem List Decreased strength;Decreased range of motion;Decreased activity tolerance;Decreased balance;Decreased mobility;Decreased coordination;Decreased skin integrity;Pain  PT Treatment Interventions DME instruction;Gait training;Stair training;Functional mobility training;Therapeutic activities;Therapeutic exercise;Balance training;Neuromuscular re-education;Patient/family education   PT Goals (Current goals can be found in the Care Plan section) Acute Rehab PT Goals Patient Stated Goal: to hurt less and get home PT Goal Formulation: With patient Time For Goal Achievement: 05/16/15 Potential to Achieve Goals: Good    Frequency Min 2X/week   Barriers to discharge Inaccessible home environment (stairs)      Co-evaluation               End of Session   Activity Tolerance: Patient tolerated treatment well;Patient limited by fatigue;Patient limited by pain Patient left: in bed;with call bell/phone within reach;with nursing/sitter in room Nurse Communication: Mobility status         Time: 3428-7681 PT Time Calculation (min) (ACUTE ONLY): 33 min   Charges:   PT Evaluation $Initial PT Evaluation Tier I: 1 Procedure PT Treatments $Gait Training: 8-22 mins   PT G CodesRamond Dial 2015/05/27, 10:38 AM   Mee Hives, PT MS Acute Rehab Dept. Number: [facility] [id number] and [place] [phone]

## 2015-05-03 MED ORDER — OXYCODONE-ACETAMINOPHEN 5-325 MG PO TABS: 1.0000 | ORAL_TABLET | ORAL | Status: DC | PRN

## 2015-05-03 MED ORDER — DIAZEPAM 5 MG PO TABS: 5.0000 mg | ORAL_TABLET | Freq: Four times a day (QID) | ORAL | Status: DC | PRN

## 2015-05-03 NOTE — Discharge Instructions (Signed)

## 2015-05-03 NOTE — Discharge Summary (Signed)
Physician Discharge Summary  Patient ID: Melissa Allen MRN: [id number] DOB/AGE: 50-Apr-1967 50 y.o.  Admit date: 05/01/2015 Discharge date: 05/03/2015  Admission Diagnoses:  Discharge Diagnoses:  Principal Problem:   Spinal stenosis, lumbar region, with neurogenic claudication Active Problems:   Degenerative spondylolisthesis   Discharged Condition: good  Hospital Course: The patient was admitted the hospital where she underwent an uncomplicated G9-2 decompression infusion. Postoperative she is doing well. Preoperative back and leg pain are improved. Patient mobilized with physical therapy without difficulty. Ready for discharge home.  Consults:   Significant Diagnostic Studies:   Treatments:   Discharge Exam: Blood pressure 132/70, pulse 108, temperature 98.4 F (36.9 C), temperature source Oral, resp. rate 20, weight 89.359 kg (197 lb), last menstrual period 12/07/2011, SpO2 97 %.   Disposition: 01-Home or Self Care     Medication List    TAKE these medications        ALPRAZolam 0.25 MG tablet  Commonly known as:  XANAX  TAKE ONE OR TWO TABLETS PER DAY --MUST BE SEEN FOR ADDITIONAL REFILLS     chlorpheniramine 4 MG tablet  Commonly known as:  CHLOR-TRIMETON  Take 4 mg by mouth 2 (two) times daily as needed for allergies.     cholecalciferol 1000 UNITS tablet  Commonly known as:  VITAMIN D  Take 1,000 Units by mouth daily.     cyclobenzaprine 10 MG tablet  Commonly known as:  FLEXERIL  Take 10 mg by mouth See admin instructions. Take 1 tablet (10 mg) daily at bedtime, may take an additional tablet during the day as needed for muscle spasms     diazepam 5 MG tablet  Commonly known as:  VALIUM  Take 1-2 tablets (5-10 mg total) by mouth every 6 (six) hours as needed for muscle spasms.     DULoxetine 60 MG capsule  Commonly known as:  CYMBALTA  TAKE ONE CAPSULE BY MOUTH EVERY NIGHT ATBEDTIME *OFFICE VISIT REQUIRED FOR ADDITIONAL REFILLS*     fluticasone 50  MCG/ACT nasal spray  Commonly known as:  FLONASE  USE 2 SPRAYS IN EACH NOSTRIL ONCE DAILY AS NEEDED. FOR ALLERGIES     HYDROcodone-acetaminophen 10-325 MG per tablet  Commonly known as:  NORCO  Take 2 tablets by mouth every 4 (four) hours as needed for moderate pain or severe pain.     ipratropium-albuterol 0.5-2.5 (3) MG/3ML Soln  Commonly known as:  DUONEB  Take 3 mLs by nebulization every 4 (four) hours as needed.     Magnesium 250 MG Tabs  Take 250 mg by mouth daily.     oxyCODONE-acetaminophen 5-325 MG per tablet  Commonly known as:  PERCOCET/ROXICET  Take 1-2 tablets by mouth every 4 (four) hours as needed for moderate pain.     PROAIR HFA 108 (90 BASE) MCG/ACT inhaler  Generic drug:  albuterol  INHALE TWO PUFFS EVERY FOUR HOURS AS NEEDED SHORTNESS OF BREATH     sennosides-docusate sodium 8.6-50 MG tablet  Commonly known as:  SENOKOT-S  Take 1 tablet by mouth daily before breakfast.     SYMBICORT 160-4.5 MCG/ACT inhaler  Generic drug:  budesonide-formoterol  INHALE 2 PUFFS EVERY 12 HOURS TO PREVENTCOUGH OR WHEEZING--RINSE, GARGLE, & SPITAFTER EACH USE.     triamterene-hydrochlorothiazide 37.5-25 MG per tablet  Commonly known as:  MAXZIDE-25  TAKE 1 TABLET BY MOUTH DAILY     Turmeric 500 MG Caps  Take 500 mg by mouth daily.     vitamin B-12 1000 MCG tablet  Commonly known as:  CYANOCOBALAMIN  Take 1,000 mcg by mouth daily.     zolpidem 10 MG tablet  Commonly known as:  AMBIEN  TAKE 1 TABLET BY MOUTH EVERY NIGHT AT BEDTIME AS NEEDED.           Follow-up Information    Follow up with Charlie Pitter, MD.   Specialty:  Neurosurgery   Contact information:   [address] [place] [address] [phone]       Signed: Charlie Pitter 05/03/2015, 9:29 AM

## 2015-05-03 NOTE — Progress Notes (Signed)
Patient alert and oriented, mae's well, voiding adequate amount of urine, swallowing without difficulty, c/o mild pain. Patient discharged home with family. Script and discharged instructions given to patient. Patient and family stated understanding of d/c instructions given and has an appointment with MD.   

## 2015-05-03 NOTE — Progress Notes (Signed)
Patient called RN in room and stated that she accidentally pulled her Right IJ out. Pressure dressing applied to site, no bleeding noted. IV team notified.

## 2015-05-03 NOTE — Progress Notes (Signed)
Physical Therapy Treatment Patient Details Name: Melissa Allen MRN: [id number] DOB: 04-Aug-1966 Today's Date: 05/03/2015    History of Present Illness 49 y.o. s/p LUMBAR FOUR-FIVE POSTERIOR LUMBAR INTERBODY FUSION. Pt has had a previous laminectomy and microdisectomy.    PT Comments    Patient mobilizing well despite increased fatigue with increased distance. At this time, anticipate patient will be safe for d/c home with supervision. Patient does continue to require ocassional cues for safety and compliance with precautions. Will continue to see as indicated and progress as tolerated.   Follow Up Recommendations  Supervision/Assistance - 24 hour     Equipment Recommendations  None recommended by PT    Recommendations for Other Services       Precautions / Restrictions Precautions Precautions: Back;Fall Precaution Booklet Issued: Yes (comment) Precaution Comments: educated on back precautions Required Braces or Orthoses: Spinal Brace Spinal Brace: Lumbar corset;Other (comment);Applied in sitting position Restrictions Weight Bearing Restrictions: No    Mobility  Bed Mobility Overal bed mobility: Modified Independent             General bed mobility comments: no physical assist or cues required to return to supine. good technique and compliance with precautions for bed mobility. patient performed in right sidelying  Transfers Overall transfer level: Needs assistance Equipment used: Rolling walker (2 wheeled);1 person hand held assist Transfers: Sit to/from Stand Sit to Stand: Supervision         General transfer comment: VCs for hand placement and safety, no physical assist required  Ambulation/Gait Ambulation/Gait assistance: Supervision Ambulation Distance (Feet): 200 Feet Assistive device: Rolling walker (2 wheeled);1 person hand held assist Gait Pattern/deviations: Step-through pattern;Wide base of support Gait velocity: decreased Gait velocity  interpretation: Below normal speed for age/gender General Gait Details: patient cues several times for relaxation and increased speed with mobility. Patient with flexed posture at times both verbal and tactile cues provided to initiate upright posture   Stairs Stairs: Yes Stairs assistance: Min assist Stair Management: Step to pattern Number of Stairs: 3 General stair comments: assist with hand held provided with VCs for safety secondary to no rails.   Wheelchair Mobility    Modified Rankin (Stroke Patients Only)       Balance   Sitting-balance support: Feet supported Sitting balance-Leahy Scale: Fair     Standing balance support: Bilateral upper extremity supported Standing balance-Leahy Scale: Fair                      Cognition Arousal/Alertness: Awake/alert Behavior During Therapy: WFL for tasks assessed/performed Overall Cognitive Status: Within Functional Limits for tasks assessed                      Exercises      General Comments General comments (skin integrity, edema, etc.): Patient required intermittent cues for compliance with precautions when standing and useing RW. Patient cued to avoid twisiting as well as bending over to pick up clothing.       Pertinent Vitals/Pain Pain Assessment: 0-10 Pain Score: 7  Pain Location: low back Pain Descriptors / Indicators: Sore Pain Intervention(s): Limited activity within patient's tolerance;Monitored during session;Premedicated before session;Repositioned    Home Living                      Prior Function            PT Goals (current goals can now be found in the care plan section) Acute Rehab PT  Goals Patient Stated Goal: to hurt less and get home PT Goal Formulation: With patient Time For Goal Achievement: 05/16/15 Potential to Achieve Goals: Good Progress towards PT goals: Progressing toward goals    Frequency  Min 5X/week    PT Plan Current plan remains appropriate     Co-evaluation             End of Session   Activity Tolerance: Patient tolerated treatment well;Patient limited by fatigue;Patient limited by pain Patient left: in bed;with call bell/phone within reach;with nursing/sitter in room     Time: 0740-0758 PT Time Calculation (min) (ACUTE ONLY): 18 min  Charges:  $Gait Training: 8-22 mins                    G CodesDuncan Dull 05-11-2015, 9:17 AM [name], PT DPT  [phone]

## 2015-05-08 ENCOUNTER — Other Ambulatory Visit: Payer: Self-pay | Admitting: Family Medicine

## 2015-05-08 NOTE — Telephone Encounter (Signed)
Pt has not had any recent f/u appts 

## 2015-05-09 NOTE — Telephone Encounter (Signed)
Rx called in to requested pharmacy 

## 2015-05-27 ENCOUNTER — Other Ambulatory Visit: Payer: Self-pay | Admitting: Family Medicine

## 2015-05-28 NOTE — Telephone Encounter (Signed)
Rx called in to requested pharmacy 

## 2015-05-28 NOTE — Telephone Encounter (Signed)
Last f/u 11/2014 

## 2015-06-03 ENCOUNTER — Other Ambulatory Visit: Payer: Self-pay | Admitting: Family Medicine

## 2015-06-05 NOTE — Telephone Encounter (Signed)
Last f/u appt 11/2014 

## 2015-06-05 NOTE — Telephone Encounter (Signed)
Rx called in to requested pharmacy 

## 2015-06-22 ENCOUNTER — Other Ambulatory Visit: Payer: Self-pay | Admitting: Family Medicine

## 2015-06-26 ENCOUNTER — Other Ambulatory Visit: Payer: Self-pay | Admitting: Family Medicine

## 2015-06-26 NOTE — Telephone Encounter (Signed)
Alprazolam last refilled 05/28/15 for #60 with 0 refills. Ok to refill this medication?

## 2015-06-26 NOTE — Telephone Encounter (Signed)
Rx faxed to pharmacy  

## 2015-06-28 ENCOUNTER — Other Ambulatory Visit: Payer: Self-pay | Admitting: Family Medicine

## 2015-06-30 ENCOUNTER — Other Ambulatory Visit: Payer: Self-pay | Admitting: Family Medicine

## 2015-07-02 NOTE — Telephone Encounter (Signed)
Pt has not had any recent f/u. Spoke to pt and advised f/u required; sched for 10/31. Pt advised if appt cancelled, she will be unable to receive additional refills, until seen

## 2015-07-02 NOTE — Telephone Encounter (Signed)
Rx called in to requested pharmacy 

## 2015-07-09 ENCOUNTER — Ambulatory Visit (INDEPENDENT_AMBULATORY_CARE_PROVIDER_SITE_OTHER): Payer: BC Managed Care – PPO | Admitting: Family Medicine

## 2015-07-09 ENCOUNTER — Encounter: Payer: Self-pay | Admitting: Family Medicine

## 2015-07-09 VITALS — BP 134/82 | HR 128 | Temp 98.8°F | Wt 200.5 lb

## 2015-07-09 DIAGNOSIS — M4806 Spinal stenosis, lumbar region: Secondary | ICD-10-CM

## 2015-07-09 DIAGNOSIS — J4520 Mild intermittent asthma, uncomplicated: Secondary | ICD-10-CM

## 2015-07-09 DIAGNOSIS — F411 Generalized anxiety disorder: Secondary | ICD-10-CM

## 2015-07-09 DIAGNOSIS — I10 Essential (primary) hypertension: Secondary | ICD-10-CM | POA: Diagnosis not present

## 2015-07-09 DIAGNOSIS — M48062 Spinal stenosis, lumbar region with neurogenic claudication: Secondary | ICD-10-CM

## 2015-07-09 DIAGNOSIS — Z23 Encounter for immunization: Secondary | ICD-10-CM | POA: Diagnosis not present

## 2015-07-09 NOTE — Progress Notes (Signed)
Pre visit review using our clinic review tool, if applicable. No additional management support is needed unless otherwise documented below in the visit note. 

## 2015-07-09 NOTE — Progress Notes (Signed)
Subjective:   Patient ID: Melissa Allen, female    DOB: 1966/04/12, 49 y.o.   MRN: [id number]  Melissa Allen is a pleasant 49 y.o. year old female who presents to clinic today with Follow-up and Numbness  on 07/09/2015  HPI: HTN- has been in more pain lately from her back.  Seeing Dr. Trenton Gammon tomorrow.  BP has remained controlled on Maxzide at current dose.  Denies HA, blurred vision, CP or SOB.  Lab Results  Component Value Date   CREATININE 0.73 04/30/2015   Anxiety/depression- has been under less stress now that she is on disability and not working.  Teaching "stresses me out."  Feels Cymbalta is working well with as needed Ambien for insomnia.  Since she has had so many back surgeries in past year, she needs to take [place] almost every night in order to get comfortable.    Asthma- symptoms well controlled now with Symbicort and as needed rescue inhaler/nebs.  BP Readings from Last 3 Encounters:  07/09/15 134/82  05/03/15 132/70  04/30/15 135/69   Current Outpatient Prescriptions on File Prior to Visit  Medication Sig Dispense Refill  . ALPRAZolam (XANAX) 0.25 MG tablet TAKE ONE OR TWO TABLETS PER DAY 60 tablet 0  . chlorpheniramine (CHLOR-TRIMETON) 4 MG tablet Take 4 mg by mouth 2 (two) times daily as needed for allergies.     . cholecalciferol (VITAMIN D) 1000 UNITS tablet Take 1,000 Units by mouth daily.    . cyclobenzaprine (FLEXERIL) 10 MG tablet Take 10 mg by mouth See admin instructions. Take 1 tablet (10 mg) daily at bedtime, may take an additional tablet during the day as needed for muscle spasms    . DULoxetine (CYMBALTA) 60 MG capsule TAKE ONE CAPSULE BY MOUTH EVERY NIGHT ATBEDTIME *MUST MAKE APPT FOR REFILLS* 90 capsule 0  . fluticasone (FLONASE) 50 MCG/ACT nasal spray USE 2 SPRAYS IN EACH NOSTRIL ONCE DAILY AS NEEDED. FOR ALLERGIES (Patient taking differently: USE 2 SPRAYS IN EACH NOSTRIL ONCE DAILY FOR ALLERGIES) 16 g 5  . HYDROcodone-acetaminophen (NORCO) 10-325 MG per  tablet Take 2 tablets by mouth every 4 (four) hours as needed for moderate pain or severe pain.     Marland Kitchen ipratropium-albuterol (DUONEB) 0.5-2.5 (3) MG/3ML SOLN Take 3 mLs by nebulization every 4 (four) hours as needed. (Patient taking differently: Take 3 mLs by nebulization every 4 (four) hours as needed (shortness of breath/wheezing). ) 360 mL 1  . Magnesium 250 MG TABS Take 250 mg by mouth daily.    Marland Kitchen PROAIR HFA 108 (90 BASE) MCG/ACT inhaler INHALE TWO PUFFS EVERY FOUR HOURS AS NEEDED SHORTNESS OF BREATH 8.5 g 2  . SYMBICORT 160-4.5 MCG/ACT inhaler INHALE 2 PUFFS EVERY 12 HOURS TO PREVENTCOUGH OR WHEEZING--RINSE, GARGLE, & SPITAFTER EACH USE. (Patient taking differently: INHALE 2 PUFFS EVERY MORNING) 10.2 g 3  . triamterene-hydrochlorothiazide (MAXZIDE-25) 37.5-25 MG tablet TAKE 1 TABLET BY MOUTH DAILY 90 tablet 0  . Turmeric 500 MG CAPS Take 500 mg by mouth daily.    . vitamin B-12 (CYANOCOBALAMIN) 1000 MCG tablet Take 1,000 mcg by mouth daily.    Marland Kitchen zolpidem (AMBIEN) 10 MG tablet TAKE 1 TABLET BY MOUTH EVERY NIGHT AT BEDTIME *NEED OFFICE VISIT 30 tablet 0   No current facility-administered medications on file prior to visit.    Allergies  Allergen Reactions  . Lisinopril Cough       . Codeine Nausea And Vomiting  . Hydrocodone Itching    Causes minor itching/ pt  is currently taking (04/27/15)  . Tape Rash    Irritation to inside of thigh, /w PINK tape, can tolerate paper tape     Past Medical History  Diagnosis Date  . Allergy   . Calcaneal fracture   . Obesity (BMI 30.0-34.9)   . Asthma     recent admit to ER 01/11/12 for exac of asthma  . Hypertension   . Fibromyalgia   . Anginal pain ([place])     one episode 4-5 years ago-[facility]-nonspecific-no problems since-  . PONV (postoperative nausea and vomiting)   . Kidney stones   . Duplicated ureter, right   . Poor venous access     hard to start iv-  . Wears glasses   . Shortness of breath dyspnea   . Anxiety     . 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 Arthritis     multiple areas of arthritis- DDD  . Pancreatitis 1986    Past Surgical History  Procedure Laterality Date  . Tonsillectomy  age 81  . Bunionectomy  1995    Right  . Cholecystectomy  late 1980's       . Lumbar laminectomy  10/06    L4-5, Ray  . Knee surgery Left     x 2, Murphy/Sue, corrected Patella displacement   . R compressed pronator Right     Sypher, R arm  . Tendon repair  2013  . Tendon repair Right 02/01/12  . Bladder suspension  03/01/2012    Procedure: TRANSVAGINAL TAPE (TVT) PROCEDURE;  Surgeon: Emily Filbert, MD;  Location: [facility];  Service: Gynecology;  Laterality: N/A;  . Cystoscopy  03/01/2012    Procedure: CYSTOSCOPY;  Surgeon: Emily Filbert, MD;  Location: [facility];  Service: Gynecology;  Laterality: N/A;  . Abdominal hysterectomy    . Incision and drainage abscess Left 07/19/2013    Procedure: INCISION AND DRAINAGE LEFT LOWER EXTERMITY HEMATOMA;  Surgeon: Imogene Burn. Georgette Dover, MD;  Location: [facility];  Service: General;  Laterality: Left;  Excision left lower leg mass  . Cystoscopy w/ ureteral stent placement Left 12/15/2014    Procedure: CYSTOSCOPY, LEFT URETEROSCOPYU WITH STONE EXTRACTION;  Surgeon: Irine Seal, MD;  Location: [facility];  Service: Urology;  Laterality: Left;    Family History  Problem Relation Age of Onset  . Heart disease Father   . Colon polyps Father   . Colon cancer Paternal Grandfather   . Colon cancer Maternal Grandmother   . Uterine cancer      Grandmother  . Breast cancer      Grandmother  . Ovarian cancer      Grandmother  . Diabetes      mother  . Other Neg Hx   . Cancer Mother     pancreatic cancer    Social History   Social History  . Marital Status: Married    Spouse Name: N/A  . Number of Children: 3  . Years of Education: N/A   Occupational History  . 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 Smoking status: Never Smoker   . Smokeless tobacco: Never Used  . Alcohol Use: 3.5 oz/week    7 Standard drinks or equivalent per week     Comment: Occasional- wine or liquor   . Drug Use: No  . Sexual Activity:  Yes    Birth Control/ Protection: Surgical   Other Topics Concern  . 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    Review of Systems  Constitutional: Negative.   HENT: Negative.   Eyes: Negative.   Respiratory: Negative.   Cardiovascular: Negative.   Endocrine: Negative.   Genitourinary: Negative.   Musculoskeletal: Positive for back pain and gait problem.  Psychiatric/Behavioral: Negative.   All other systems reviewed and are negative.      Objective:    BP 134/82 mmHg  Pulse 128  Temp(Src) 98.8 F (37.1 C) (Oral)  Wt 200 lb 8 oz (90.946 kg)  SpO2 97%  LMP 12/07/2011   Physical Exam   General:  Standing throughout the exam- "makes my back feel better." Head:  normocephalic and atraumatic.   Lungs:  Normal respiratory effort, chest expands symmetrically. Lungs are clear to auscultation, no crackles or wheezes. Heart:  Normal rate and regular rhythm. S1 and S2 normal without gallop, murmur, click, rub or other extra sounds. Msk:  No deformity or scoliosis noted of thoracic or lumbar spine.   Extremities:  No clubbing, cyanosis, edema, or deformity noted with normal full range of motion of all joints.   Neurologic:  alert & oriented X3 and gait normal.   Skin:  Intact without suspicious lesions or rashes Psych:  Cognition and judgment appear intact. Alert and cooperative with normal attention span and concentration. No  apparent delusions, illusions, hallucinations Ext:  No edema       Assessment & Plan:   Need for influenza vaccination - Plan: Flu Vaccine QUAD 36+ mos PF IM (Fluarix & Fluzone Quad PF)  Essential hypertension No Follow-up on file.

## 2015-07-10 DIAGNOSIS — Z23 Encounter for immunization: Secondary | ICD-10-CM | POA: Diagnosis not present

## 2015-07-10 NOTE — Assessment & Plan Note (Signed)
Symptoms worsening. Agree with seeing her neurosurgeon right away.

## 2015-07-10 NOTE — Assessment & Plan Note (Signed)
Well controlled on symbicort.

## 2015-07-10 NOTE — Assessment & Plan Note (Signed)
Well controlled on current rx.

## 2015-07-10 NOTE — Assessment & Plan Note (Signed)
Symptoms well controlled on current rxs. No changes made.

## 2015-07-17 ENCOUNTER — Other Ambulatory Visit: Payer: Self-pay | Admitting: Family Medicine

## 2015-07-20 ENCOUNTER — Other Ambulatory Visit: Payer: Self-pay | Admitting: Family Medicine

## 2015-07-23 ENCOUNTER — Encounter: Payer: Self-pay | Admitting: Family Medicine

## 2015-07-23 NOTE — Telephone Encounter (Signed)
Last f/u 07/2015

## 2015-07-23 NOTE — Telephone Encounter (Signed)
Rx called in to requested pharmacy 

## 2015-07-27 ENCOUNTER — Emergency Department (HOSPITAL_COMMUNITY)
Admission: EM | Admit: 2015-07-27 | Discharge: 2015-07-27 | Disposition: A | Discharge status: Home / Self Care | Payer: BC Managed Care – PPO | Attending: Emergency Medicine | Admitting: Emergency Medicine

## 2015-07-27 ENCOUNTER — Encounter (HOSPITAL_COMMUNITY): Payer: Self-pay | Admitting: Emergency Medicine

## 2015-07-27 ENCOUNTER — Emergency Department (HOSPITAL_COMMUNITY): Payer: BC Managed Care – PPO

## 2015-07-27 DIAGNOSIS — R000 Tachycardia, unspecified: Secondary | ICD-10-CM | POA: Diagnosis not present

## 2015-07-27 DIAGNOSIS — M159 Polyosteoarthritis, unspecified: Secondary | ICD-10-CM | POA: Diagnosis not present

## 2015-07-27 DIAGNOSIS — E876 Hypokalemia: Secondary | ICD-10-CM | POA: Diagnosis not present

## 2015-07-27 DIAGNOSIS — E669 Obesity, unspecified: Secondary | ICD-10-CM | POA: Diagnosis not present

## 2015-07-27 DIAGNOSIS — M797 Fibromyalgia: Secondary | ICD-10-CM | POA: Insufficient documentation

## 2015-07-27 DIAGNOSIS — F419 Anxiety disorder, unspecified: Secondary | ICD-10-CM | POA: Insufficient documentation

## 2015-07-27 DIAGNOSIS — G40909 Epilepsy, unspecified, not intractable, without status epilepticus: Secondary | ICD-10-CM | POA: Insufficient documentation

## 2015-07-27 DIAGNOSIS — Q625 Duplication of ureter: Secondary | ICD-10-CM | POA: Insufficient documentation

## 2015-07-27 DIAGNOSIS — I10 Essential (primary) hypertension: Secondary | ICD-10-CM | POA: Insufficient documentation

## 2015-07-27 DIAGNOSIS — Z87442 Personal history of urinary calculi: Secondary | ICD-10-CM | POA: Diagnosis not present

## 2015-07-27 DIAGNOSIS — J45901 Unspecified asthma with (acute) exacerbation: Secondary | ICD-10-CM | POA: Insufficient documentation

## 2015-07-27 DIAGNOSIS — R0602 Shortness of breath: Secondary | ICD-10-CM | POA: Diagnosis present

## 2015-07-27 DIAGNOSIS — Z8781 Personal history of (healed) traumatic fracture: Secondary | ICD-10-CM | POA: Insufficient documentation

## 2015-07-27 DIAGNOSIS — Z79899 Other long term (current) drug therapy: Secondary | ICD-10-CM | POA: Insufficient documentation

## 2015-07-27 LAB — BASIC METABOLIC PANEL
Anion gap: 15 (ref 5–15)
BUN: 12 mg/dL (ref 6–20)
CHLORIDE: 102 mmol/L (ref 101–111)
CO2: 23 mmol/L (ref 22–32)
CREATININE: 0.91 mg/dL (ref 0.44–1.00)
Calcium: 10.3 mg/dL (ref 8.9–10.3)
GFR calc Af Amer: >60 mL/min (ref >60)
GFR calc non Af Amer: >60 mL/min (ref >60)
GLUCOSE: 121 mg/dL — AB (ref 65–99)
POTASSIUM: 2.8 mmol/L — AB (ref 3.5–5.1)
SODIUM: 140 mmol/L (ref 135–145)

## 2015-07-27 LAB — CBC WITH DIFFERENTIAL/PLATELET
Basophils Absolute: 0 10*3/uL (ref 0.0–0.1)
Basophils Relative: 1 %
EOS ABS: 0.1 10*3/uL (ref 0.0–0.7)
EOS PCT: 2 %
HCT: 47.1 % — ABNORMAL HIGH (ref 36.0–46.0)
Hemoglobin: 15.7 g/dL — ABNORMAL HIGH (ref 12.0–15.0)
LYMPHS ABS: 2.7 10*3/uL (ref 0.7–4.0)
LYMPHS PCT: 31 %
MCH: 30.2 pg (ref 26.0–34.0)
MCHC: 33.3 g/dL (ref 30.0–36.0)
MCV: 90.6 fL (ref 78.0–100.0)
MONO ABS: 0.5 10*3/uL (ref 0.1–1.0)
Monocytes Relative: 6 %
Neutro Abs: 5.2 10*3/uL (ref 1.7–7.7)
Neutrophils Relative %: 60 %
PLATELETS: 256 10*3/uL (ref 150–400)
RBC: 5.2 MIL/uL — AB (ref 3.87–5.11)
RDW: 13.4 % (ref 11.5–15.5)
WBC: 8.6 10*3/uL (ref 4.0–10.5)

## 2015-07-27 LAB — I-STAT TROPONIN, ED: TROPONIN I, POC: 0 ng/mL (ref 0.00–0.08)

## 2015-07-27 LAB — MAGNESIUM: Magnesium: 1.9 mg/dL (ref 1.7–2.4)

## 2015-07-27 MED ORDER — POTASSIUM CHLORIDE ER 20 MEQ PO TBCR: 10.0000 meq | EXTENDED_RELEASE_TABLET | Freq: Every day | ORAL | Status: DC

## 2015-07-27 MED ORDER — ALBUTEROL SULFATE (2.5 MG/3ML) 0.083% IN NEBU
5.0000 mg | INHALATION_SOLUTION | Freq: Once | RESPIRATORY_TRACT | Status: AC
  Administered 2015-07-27: 5 mg via RESPIRATORY_TRACT
  Filled 2015-07-27: qty 6

## 2015-07-27 MED ORDER — SODIUM CHLORIDE 0.9 % IV BOLUS (SEPSIS)
500.0000 mL | Freq: Once | INTRAVENOUS | Status: AC
  Administered 2015-07-27: 500 mL via INTRAVENOUS

## 2015-07-27 MED ORDER — PREDNISONE 20 MG PO TABS
60.0000 mg | ORAL_TABLET | Freq: Once | ORAL | Status: AC
  Administered 2015-07-27: 60 mg via ORAL
  Filled 2015-07-27: qty 3

## 2015-07-27 MED ORDER — PREDNISONE 10 MG PO TABS: 40.0000 mg | ORAL_TABLET | Freq: Every day | ORAL | Status: DC

## 2015-07-27 MED ORDER — POTASSIUM CHLORIDE CRYS ER 20 MEQ PO TBCR
40.0000 meq | EXTENDED_RELEASE_TABLET | Freq: Once | ORAL | Status: AC
  Administered 2015-07-27: 40 meq via ORAL
  Filled 2015-07-27: qty 2

## 2015-07-27 NOTE — ED Notes (Signed)
Pt called out and stated "i'm ready to get out of here since I can breathe better"

## 2015-07-27 NOTE — ED Provider Notes (Signed)
CSN: [id number]     Arrival date & time 07/27/15  1718 History   First MD Initiated Contact with Patient 07/27/15 1738     Chief Complaint  Patient presents with  . Shortness of Breath      The history is provided by the patient. No language interpreter was used.   Melissa Allen is a 49 y.o. female who presents to the Emergency Department complaining of shortness of breath. She has a history of asthma. About 4 PM today she developed sudden onset shortness of breath after opening her window. She tried her rescue inhaler twice without improvement. She tried her nebulizer treatment twice without improvement. She presents via EMS. She states that she has significant improvement in her breathing since receiving the nebulizer treatments. She recently had lumbar surgery in August. No history of blood clots. She denies any fevers or chest pain.   Past Medical History  Diagnosis Date  . Allergy   . Calcaneal fracture   . Obesity (BMI 30.0-34.9)   . Asthma     recent admit to ER 01/11/12 for exac of asthma  . Hypertension   . Fibromyalgia   . Anginal pain ([place])     one episode 4-5 years ago-[facility] [place]-nonspecific-no problems since-  . PONV (postoperative nausea and vomiting)   . Kidney stones   . Duplicated ureter, right   . Poor venous access     hard to start iv-  . Wears glasses   . Shortness of breath dyspnea   . Anxiety   . 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 Arthritis     multiple areas of arthritis- DDD  . Pancreatitis 1986   Past Surgical History  Procedure Laterality Date  . Tonsillectomy  age 26  . Bunionectomy  1995    Right  . Cholecystectomy  late 1980's       . Lumbar laminectomy  10/06    L4-5, Ray  . Knee surgery Left     x 2, Murphy/Sue, corrected Patella displacement   . R compressed pronator Right     Sypher, R arm  . Tendon repair  2013  . Tendon repair Right 02/01/12  .  Bladder suspension  03/01/2012    Procedure: TRANSVAGINAL TAPE (TVT) PROCEDURE;  Surgeon: Emily Filbert, MD;  Location: [facility];  Service: Gynecology;  Laterality: N/A;  . Cystoscopy  03/01/2012    Procedure: CYSTOSCOPY;  Surgeon: Emily Filbert, MD;  Location: [facility];  Service: Gynecology;  Laterality: N/A;  . Abdominal hysterectomy    . Incision and drainage abscess Left 07/19/2013    Procedure: INCISION AND DRAINAGE LEFT LOWER EXTERMITY HEMATOMA;  Surgeon: Imogene Burn. Georgette Dover, MD;  Location: [facility];  Service: General;  Laterality: Left;  Excision left lower leg mass  . Cystoscopy w/ ureteral stent placement Left 12/15/2014    Procedure: CYSTOSCOPY, LEFT URETEROSCOPYU WITH STONE EXTRACTION;  Surgeon: Irine Seal, MD;  Location: [facility];  Service: Urology;  Laterality: Left;   Family History  Problem Relation Age of Onset  . Heart disease Father   . Colon polyps Father   . Colon cancer Paternal Grandfather   . Colon cancer Maternal Grandmother   . Uterine cancer      Grandmother  . Breast cancer      Grandmother  . Ovarian cancer      Grandmother  . Diabetes  mother  . Other Neg Hx   . Cancer Mother     pancreatic cancer   Social History  Substance Use Topics  . Smoking status: Never Smoker   . Smokeless tobacco: Never Used  . Alcohol Use: 3.5 oz/week    7 Standard drinks or equivalent per week     Comment: Occasional- wine or liquor    OB History    Gravida Para Term Preterm AB TAB SAB Ectopic Multiple Living   3 3 2 1  0 0 0 0 0 3     Review of Systems  All other systems reviewed and are negative.     Allergies  Lisinopril; Codeine; Hydrocodone; and Tape  Home Medications   Prior to Admission medications   Medication Sig Start Date End Date Taking? Authorizing Provider  ALPRAZolam Duanne Moron) 0.25 MG tablet TAKE ONE OR TWO TABLETS PER DAY 07/23/15   Lucille Passy, MD  chlorpheniramine (CHLOR-TRIMETON) 4 MG tablet Take 4 mg by mouth 2 (two) times daily as  needed for allergies.     Historical Provider, MD  cholecalciferol (VITAMIN D) 1000 UNITS tablet Take 1,000 Units by mouth daily.    Historical Provider, MD  cyclobenzaprine (FLEXERIL) 10 MG tablet Take 10 mg by mouth See admin instructions. Take 1 tablet (10 mg) daily at bedtime, may take an additional tablet during the day as needed for muscle spasms    Historical Provider, MD  DULoxetine (CYMBALTA) 60 MG capsule TAKE ONE CAPSULE BY MOUTH EVERY NIGHT ATBEDTIME *MUST MAKE APPT FOR REFILLS* 07/02/15   Lucille Passy, MD  fluticasone (FLONASE) 50 MCG/ACT nasal spray USE 2 SPRAYS IN EACH NOSTRIL ONCE DAILY AS NEEDED. FOR ALLERGIES Patient taking differently: USE 2 SPRAYS IN EACH NOSTRIL ONCE DAILY FOR ALLERGIES 10/02/14   Lucille Passy, MD  HYDROcodone-acetaminophen [facility]) 10-325 MG per tablet Take 2 tablets by mouth every 4 (four) hours as needed for moderate pain or severe pain.     Historical Provider, MD  ipratropium-albuterol (DUONEB) 0.5-2.5 (3) MG/3ML SOLN Take 3 mLs by nebulization every 4 (four) hours as needed. Patient taking differently: Take 3 mLs by nebulization every 4 (four) hours as needed (shortness of breath/wheezing).  01/04/13   Lucille Passy, MD  Magnesium 250 MG TABS Take 250 mg by mouth daily.    Historical Provider, MD  PROAIR HFA 108 (90 BASE) MCG/ACT inhaler INHALE TWO PUFFS EVERY FOUR HOURS AS NEEDED SHORTNESS OF BREATH 07/18/14   Lucille Passy, MD  SYMBICORT 160-4.5 MCG/ACT inhaler INHALE 2 PUFFS EVERY 12 HOURS TO PREVENTCOUGH OR WHEEZING--RINSE, GARGLE, & SPITAFTER EACH USE. Patient taking differently: INHALE 2 PUFFS EVERY MORNING 10/02/14   Lucille Passy, MD  triamterene-hydrochlorothiazide (MAXZIDE-25) 37.5-25 MG tablet TAKE 1 TABLET BY MOUTH DAILY 06/22/15   Lucille Passy, MD  Turmeric 500 MG CAPS Take 500 mg by mouth daily.    Historical Provider, MD  vitamin B-12 (CYANOCOBALAMIN) 1000 MCG tablet Take 1,000 mcg by mouth daily.    Historical Provider, MD  zolpidem (AMBIEN) 10 MG  tablet TAKE 1 TABLET BY MOUTH EVERY NIGHT AT BEDTIME *NEED OFFICE VISIT 07/02/15   Lucille Passy, MD   BP 159/98 mmHg  Pulse 130  Temp(Src) 98.2 F (36.8 C) (Oral)  Resp 23  SpO2 100%  LMP 12/07/2011 Physical Exam  Constitutional: She is oriented to person, place, and time. She appears well-developed and well-nourished. She appears distressed.  HENT:  Head: Normocephalic and atraumatic.  Cardiovascular: Regular rhythm.  No murmur heard. Tachycardic  Pulmonary/Chest: Effort normal. No respiratory distress.  Tachypnea with good air movement bilaterally  Abdominal: Soft. There is no tenderness. There is no rebound and no guarding.  Musculoskeletal: She exhibits no edema or tenderness.  Neurological: She is alert and oriented to person, place, and time.  Skin: Skin is warm and dry.  Psychiatric:  anxious  Nursing note and vitals reviewed.   ED Course  Procedures (including critical care time) Labs Review Labs Reviewed  BASIC METABOLIC PANEL - Abnormal; Notable for the following:    Potassium 2.8 (*)    Glucose, Bld 121 (*)    All other components within normal limits  CBC WITH DIFFERENTIAL/PLATELET - Abnormal; Notable for the following:    RBC 5.20 (*)    Hemoglobin 15.7 (*)    HCT 47.1 (*)    All other components within normal limits  MAGNESIUM  I-STAT TROPOININ, ED    Imaging Review Dg Chest 2 View  07/27/2015  CLINICAL DATA:  Shortness of breath EXAM: CHEST - 2 VIEW COMPARISON:  05/01/2015 FINDINGS: Low lung volumes. Subsegmental atelectasis in the lung bases bilaterally. Heart size upper limits normal for technique. Old right sixth and seventh rib fractures. No pneumothorax. No effusion. Spondylitic changes in the upper lumbar spine. Surgical clips right upper abdomen. IMPRESSION: 1. Low volume lung volumes with linear scarring or subsegmental atelectasis in both lung bases. Electronically Signed   By: Lucrezia Europe M.D.   On: 07/27/2015 19:26   I have personally  reviewed and evaluated these images and lab results as part of my medical decision-making.   EKG Interpretation   Date/Time:  Friday July 27 2015 17:24:43 EST Ventricular Rate:  128 PR Interval:  106 QRS Duration: 79 QT Interval:  391 QTC Calculation: 571 R Axis:   122 Text Interpretation:  Sinus tachycardia Right axis deviation Low voltage,  precordial leads Borderline repolarization abnormality Prolonged QT  interval Confirmed by Hazle Coca [phone]) on 07/27/2015 5:40:41 PM      MDM   Final diagnoses:  Asthma exacerbation  Hypokalemia    Patient with history of asthma here for shortness of breath. Patient initially to Neck and very uncomfortable appearing on examination. No wheezing on my exam but per report had extensive wheezing prior to ED arrival. On repeat evaluation she is much improved. She remains mildly tachycardic but has good air movement bilaterally, tachypnea resolved. She states she no longer has shortness of breath, feeling better, wants to go home. Presentation is not consistent with PE, CHF, pneumonia. She is noted to be hyperkalemic, replaced orally. Discussed outpatient importance of getting her potassium rechecked. Return precautions were discussed.   Quintella Reichert, MD 07/28/15 [phone]

## 2015-07-27 NOTE — ED Notes (Signed)
Patient comes in with complaints of SOB. Patient has Hx of Asthma. Patient took her inhaler. Patient received zero relief. Patient received a total of 10 albuterol and 0.5 Atrovent. Patient alert and oriented on arrival.   Labored with and without exertion. Expiratory wheezing noted. Patient also complains of back pain states had surgery in Aug.

## 2015-07-27 NOTE — ED Notes (Signed)
Pt verbalized understanding of d/c instructions and has no further questions. Pt stable and NAD.  

## 2015-07-27 NOTE — ED Notes (Signed)
Called lab to consult about Magnesium lab results, it was added on and should result soon.

## 2015-07-27 NOTE — Discharge Instructions (Signed)
Asthma, Adult °Asthma is a recurring condition in which the airways tighten and narrow. Asthma can make it difficult to breathe. It can cause coughing, wheezing, and shortness of breath. Asthma episodes, also called asthma attacks, range from minor to life-threatening. Asthma cannot be cured, but medicines and lifestyle changes can help control it. °CAUSES °Asthma is believed to be caused by inherited (genetic) and environmental factors, but its exact cause is unknown. Asthma may be triggered by allergens, lung infections, or irritants in the air. Asthma triggers are different for each person. Common triggers include:  °· Animal dander. °· Dust mites. °· Cockroaches. °· Pollen from trees or grass. °· Mold. °· Smoke. °· Air pollutants such as dust, household cleaners, hair sprays, aerosol sprays, paint fumes, strong chemicals, or strong odors. °· Cold air, weather changes, and winds (which increase molds and pollens in the air). °· Strong emotional expressions such as crying or laughing hard. °· Stress. °· Certain medicines (such as aspirin) or types of drugs (such as beta-blockers). °· Sulfites in foods and drinks. Foods and drinks that may contain sulfites include dried fruit, potato chips, and sparkling grape juice. °· Infections or inflammatory conditions such as the flu, a cold, or an inflammation of the nasal membranes (rhinitis). °· Gastroesophageal reflux disease (GERD). °· Exercise or strenuous activity. °SYMPTOMS °Symptoms may occur immediately after asthma is triggered or many hours later. Symptoms include: °· Wheezing. °· Excessive nighttime or early morning coughing. °· Frequent or severe coughing with a common cold. °· Chest tightness. °· Shortness of breath. °DIAGNOSIS  °The diagnosis of asthma is made by a review of your medical history and a physical exam. Tests may also be performed. These may include: °· Lung function studies. These tests show how much air you breathe in and out. °· Allergy  tests. °· Imaging tests such as X-rays. °TREATMENT  °Asthma cannot be cured, but it can usually be controlled. Treatment involves identifying and avoiding your asthma triggers. It also involves medicines. There are 2 classes of medicine used for asthma treatment:  °· Controller medicines. These prevent asthma symptoms from occurring. They are usually taken every day. °· Reliever or rescue medicines. These quickly relieve asthma symptoms. They are used as needed and provide short-term relief. °Your health care provider will help you create an asthma action plan. An asthma action plan is a written plan for managing and treating your asthma attacks. It includes a list of your asthma triggers and how they may be avoided. It also includes information on when medicines should be taken and when their dosage should be changed. An action plan may also involve the use of a device called a peak flow meter. A peak flow meter measures how well the lungs are working. It helps you monitor your condition. °HOME CARE INSTRUCTIONS  °· Take medicines only as directed by your health care provider. Speak with your health care provider if you have questions about how or when to take the medicines. °· Use a peak flow meter as directed by your health care provider. Record and keep track of readings. °· Understand and use the action plan to help minimize or stop an asthma attack without needing to seek medical care. °· Control your home environment in the following ways to help prevent asthma attacks: °· Do not smoke. Avoid being exposed to secondhand smoke. °· Change your heating and air conditioning filter regularly. °· Limit your use of fireplaces and wood stoves. °· Get rid of pests (such as roaches   and mice) and their droppings.  Throw away plants if you see mold on them.  Clean your floors and dust regularly. Use unscented cleaning products.  Try to have someone else vacuum for you regularly. Stay out of rooms while they are  being vacuumed and for a short while afterward. If you vacuum, use a dust mask from a hardware store, a double-layered or microfilter vacuum cleaner bag, or a vacuum cleaner with a HEPA filter.  Replace carpet with wood, tile, or vinyl flooring. Carpet can trap dander and dust.  Use allergy-proof pillows, mattress covers, and box spring covers.  Wash bed sheets and blankets every week in hot water and dry them in a dryer.  Use blankets that are made of polyester or cotton.  Clean bathrooms and kitchens with bleach. If possible, have someone repaint the walls in these rooms with mold-resistant paint. Keep out of the rooms that are being cleaned and painted.  Wash hands frequently. SEEK MEDICAL CARE IF:   You have wheezing, shortness of breath, or a cough even if taking medicine to prevent attacks.  The colored mucus you cough up (sputum) is thicker than usual.  Your sputum changes from clear or white to yellow, green, gray, or bloody.  You have any problems that may be related to the medicines you are taking (such as a rash, itching, swelling, or trouble breathing).  You are using a reliever medicine more than 2-3 times per week.  Your peak flow is still at 50-79% of your personal best after following your action plan for 1 hour.  You have a fever. SEEK IMMEDIATE MEDICAL CARE IF:   You seem to be getting worse and are unresponsive to treatment during an asthma attack.  You are short of breath even at rest.  You get short of breath when doing very little physical activity.  You have difficulty eating, drinking, or talking due to asthma symptoms.  You develop chest pain.  You develop a fast heartbeat.  You have a bluish color to your lips or fingernails.  You are light-headed, dizzy, or faint.  Your peak flow is less than 50% of your personal best.   This information is not intended to replace advice given to you by your health care provider. Make sure you discuss any  questions you have with your health care provider.   Document Released: 08/25/2005 Document Revised: 05/16/2015 Document Reviewed: 03/24/2013 Elsevier Interactive Patient Education 2016 [employer].  Hypokalemia Hypokalemia means that the amount of potassium in the blood is lower than normal.Potassium is a chemical, called an electrolyte, that helps regulate the amount of fluid in the body. It also stimulates muscle contraction and helps nerves function properly.Most of the body's potassium is inside of cells, and only a very small amount is in the blood. Because the amount in the blood is so small, minor changes can be life-threatening. CAUSES  Antibiotics.  Diarrhea or vomiting.  Using laxatives too much, which can cause diarrhea.  Chronic kidney disease.  Water pills (diuretics).  Eating disorders (bulimia).  Low magnesium level.  Sweating a lot. SIGNS AND SYMPTOMS  Weakness.  Constipation.  Fatigue.  Muscle cramps.  Mental confusion.  Skipped heartbeats or irregular heartbeat (palpitations).  Tingling or numbness. DIAGNOSIS  Your health care provider can diagnose hypokalemia with blood tests. In addition to checking your potassium level, your health care provider may also check other lab tests. TREATMENT Hypokalemia can be treated with potassium supplements taken by mouth or adjustments in  your current medicines. If your potassium level is very low, you may need to get potassium through a vein (IV) and be monitored in the hospital. A diet high in potassium is also helpful. Foods high in potassium are:  Nuts, such as peanuts and pistachios.  Seeds, such as sunflower seeds and pumpkin seeds.  Peas, lentils, and lima beans.  Whole grain and bran cereals and breads.  Fresh fruit and vegetables, such as apricots, avocado, bananas, cantaloupe, kiwi, oranges, tomatoes, asparagus, and potatoes.  Orange and tomato juices.  Red meats.  Fruit yogurt. HOME CARE  INSTRUCTIONS  Take all medicines as prescribed by your health care provider.  Maintain a healthy diet by including nutritious food, such as fruits, vegetables, nuts, whole grains, and lean meats.  If you are taking a laxative, be sure to follow the directions on the label. SEEK MEDICAL CARE IF:  Your weakness gets worse.  You feel your heart pounding or racing.  You are vomiting or having diarrhea.  You are diabetic and having trouble keeping your blood glucose in the normal range. SEEK IMMEDIATE MEDICAL CARE IF:  You have chest pain, shortness of breath, or dizziness.  You are vomiting or having diarrhea for more than 2 days.  You faint. MAKE SURE YOU:   Understand these instructions.  Will watch your condition.  Will get help right away if you are not doing well or get worse.   This information is not intended to replace advice given to you by your health care provider. Make sure you discuss any questions you have with your health care provider.   Document Released: 08/25/2005 Document Revised: 09/15/2014 Document Reviewed: 02/25/2013 Elsevier Interactive Patient Education [employer].

## 2015-07-31 ENCOUNTER — Ambulatory Visit (INDEPENDENT_AMBULATORY_CARE_PROVIDER_SITE_OTHER): Payer: BC Managed Care – PPO | Admitting: Family Medicine

## 2015-07-31 ENCOUNTER — Encounter: Payer: Self-pay | Admitting: Family Medicine

## 2015-07-31 VITALS — BP 144/84 | HR 115 | Temp 98.1°F | Wt 194.8 lb

## 2015-07-31 DIAGNOSIS — J4520 Mild intermittent asthma, uncomplicated: Secondary | ICD-10-CM

## 2015-07-31 DIAGNOSIS — E876 Hypokalemia: Secondary | ICD-10-CM

## 2015-07-31 DIAGNOSIS — Z23 Encounter for immunization: Secondary | ICD-10-CM

## 2015-07-31 LAB — BASIC METABOLIC PANEL
BUN: 16 mg/dL (ref 6–23)
CHLORIDE: 101 meq/L (ref 96–112)
CO2: 29 mEq/L (ref 19–32)
CREATININE: 0.82 mg/dL (ref 0.40–1.20)
Calcium: 10.1 mg/dL (ref 8.4–10.5)
GFR: 78.25 mL/min (ref >60.00)
Glucose, Bld: 105 mg/dL — ABNORMAL HIGH (ref 70–99)
Potassium: 3.5 mEq/L (ref 3.5–5.1)
SODIUM: 139 meq/L (ref 135–145)

## 2015-07-31 NOTE — Progress Notes (Signed)
Subjective:   Patient ID: Melissa Allen, female    DOB: [birth year], 49 y.o.   MRN: [id number]  Melissa Allen is a pleasant 49 y.o. year old female who presents to clinic today with Hospitalization Follow-up  on 07/31/2015  HPI: Notes reviewed.  Was seen in [place] on 07/27/15 for asthma exacerbation/SOB.  Acute onset of SOB that did not improve with use of her rescue inhaler x 2 and her nebulizer x 2.  She presented to ER via EMS.  No fever or CP.  Dg Chest 2 View  07/27/2015  CLINICAL DATA:  Shortness of breath EXAM: CHEST - 2 VIEW COMPARISON:  05/01/2015 FINDINGS: Low lung volumes. Subsegmental atelectasis in the lung bases bilaterally. Heart size upper limits normal for technique. Old right sixth and seventh rib fractures. No pneumothorax. No effusion. Spondylitic changes in the upper lumbar spine. Surgical clips right upper abdomen. IMPRESSION: 1. Low volume lung volumes with linear scarring or subsegmental atelectasis in both lung bases. Electronically Signed   By: Lucrezia Europe M.D.   On: 07/27/2015 19:26   Lab Results  Component Value Date   WBC 8.6 07/27/2015   HGB 15.7* 07/27/2015   HCT 47.1* 07/27/2015   MCV 90.6 07/27/2015   PLT 256 07/27/2015   Lab Results  Component Value Date   NA 140 07/27/2015   K 2.8* 07/27/2015   CL 102 07/27/2015   CO2 23 07/27/2015   CXR and EKG reassuring.  She was hypokalemic- 2.8 which was repleted in the ER.  SOB resolved with nebs treatment in ER.  Sent home with prednisone taper.  Finished last dose today.  Feels better today. Tired but breathing better.  Denies SOB and wheezing.  Current Outpatient Prescriptions on File Prior to Visit  Medication Sig Dispense Refill  . ALPRAZolam (XANAX) 0.25 MG tablet TAKE ONE OR TWO TABLETS PER DAY 60 tablet 0  . cholecalciferol (VITAMIN D) 1000 UNITS tablet Take 1,000 Units by mouth daily.    . cyclobenzaprine (FLEXERIL) 10 MG tablet Take 10 mg by mouth See admin instructions. Take 1 tablet  (10 mg) daily at bedtime, may take an additional tablet during the day as needed for muscle spasms    . diphenhydrAMINE (BENADRYL) 25 MG tablet Take 25 mg by mouth every 6 (six) hours as needed for itching.    . DULoxetine (CYMBALTA) 60 MG capsule TAKE ONE CAPSULE BY MOUTH EVERY NIGHT ATBEDTIME *MUST MAKE APPT FOR REFILLS* 90 capsule 0  . fluticasone (FLONASE) 50 MCG/ACT nasal spray USE 2 SPRAYS IN EACH NOSTRIL ONCE DAILY AS NEEDED. FOR ALLERGIES (Patient taking differently: USE 2 SPRAYS IN EACH NOSTRIL ONCE DAILY FOR ALLERGIES) 16 g 5  . HYDROcodone-acetaminophen (NORCO) 10-325 MG per tablet Take 2 tablets by mouth every 4 (four) hours as needed for moderate pain or severe pain.     Marland Kitchen ipratropium-albuterol (DUONEB) 0.5-2.5 (3) MG/3ML SOLN Take 3 mLs by nebulization every 4 (four) hours as needed. (Patient taking differently: Take 3 mLs by nebulization every 4 (four) hours as needed (shortness of breath/wheezing). ) 360 mL 1  . Magnesium 250 MG TABS Take 250 mg by mouth daily.    . potassium chloride 20 MEQ TBCR Take 10 mEq by mouth daily. 4 tablet 0  . PROAIR HFA 108 (90 BASE) MCG/ACT inhaler INHALE TWO PUFFS EVERY FOUR HOURS AS NEEDED SHORTNESS OF BREATH 8.5 g 2  . SYMBICORT 160-4.5 MCG/ACT inhaler INHALE 2 PUFFS EVERY 12 HOURS TO PREVENTCOUGH OR WHEEZING--RINSE, GARGLE, &  SPITAFTER EACH USE. (Patient taking differently: INHALE 2 PUFFS EVERY MORNING) 10.2 g 3  . triamterene-hydrochlorothiazide (MAXZIDE-25) 37.5-25 MG tablet TAKE 1 TABLET BY MOUTH DAILY 90 tablet 0  . Turmeric 500 MG CAPS Take 500 mg by mouth daily.    . vitamin B-12 (CYANOCOBALAMIN) 1000 MCG tablet Take 1,000 mcg by mouth daily.    Marland Kitchen zolpidem (AMBIEN) 10 MG tablet TAKE 1 TABLET BY MOUTH EVERY NIGHT AT BEDTIME *NEED OFFICE VISIT 30 tablet 0   No current facility-administered medications on file prior to visit.    Allergies  Allergen Reactions  . Codeine Nausea And Vomiting  . Lisinopril Cough       . Hydrocodone Itching     Causes minor itching/ pt is currently taking (04/27/15)  . Tape Rash    Irritation to inside of thigh, /w PINK tape, can tolerate paper tape     Past Medical History  Diagnosis Date  . Allergy   . Calcaneal fracture   . Obesity (BMI 30.0-34.9)   . Asthma     recent admit to ER 01/11/12 for exac of asthma  . Hypertension   . Fibromyalgia   . Anginal pain ([place])     one episode 4-5 years ago-[facility]-nonspecific-no problems since-  . PONV (postoperative nausea and vomiting)   . Kidney stones   . Duplicated ureter, right   . Poor venous access     hard to start iv-  . Wears glasses   . Shortness of breath dyspnea   . Anxiety   . 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 Arthritis     multiple areas of arthritis- DDD  . Pancreatitis 1986    Past Surgical History  Procedure Laterality Date  . Tonsillectomy  age 37  . Bunionectomy  1995    Right  . Cholecystectomy  late 1980's       . Lumbar laminectomy  10/06    L4-5, Ray  . Knee surgery Left     x 2, Murphy/Sue, corrected Patella displacement   . R compressed pronator Right     Sypher, R arm  . Tendon repair  2013  . Tendon repair Right 02/01/12  . Bladder suspension  03/01/2012    Procedure: TRANSVAGINAL TAPE (TVT) PROCEDURE;  Surgeon: Emily Filbert, MD;  Location: [facility];  Service: Gynecology;  Laterality: N/A;  . Cystoscopy  03/01/2012    Procedure: CYSTOSCOPY;  Surgeon: Emily Filbert, MD;  Location: [facility];  Service: Gynecology;  Laterality: N/A;  . Abdominal hysterectomy    . Incision and drainage abscess Left 07/19/2013    Procedure: INCISION AND DRAINAGE LEFT LOWER EXTERMITY HEMATOMA;  Surgeon: Imogene Burn. Georgette Dover, MD;  Location: [facility];  Service: General;  Laterality: Left;  Excision left lower leg mass  . Cystoscopy w/ ureteral stent placement Left 12/15/2014    Procedure: CYSTOSCOPY, LEFT URETEROSCOPYU WITH STONE EXTRACTION;   Surgeon: Irine Seal, MD;  Location: [facility];  Service: Urology;  Laterality: Left;    Family History  Problem Relation Age of Onset  . Heart disease Father   . Colon polyps Father   . Colon cancer Paternal Grandfather   . Colon cancer Maternal Grandmother   . Uterine cancer      Grandmother  . Breast cancer      Grandmother  . Ovarian cancer      Grandmother  .  Diabetes      mother  . Other Neg Hx   . Cancer Mother     pancreatic cancer    Social History   Social History  . Marital Status: Married    Spouse Name: N/A  . Number of Children: 3  . Years of Education: N/A   Occupational History  . 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 Smoking status: Never Smoker   . Smokeless tobacco: Never Used  . Alcohol Use: 3.5 oz/week    7 Standard drinks or equivalent per week     Comment: Occasional- wine or liquor   . Drug Use: No  . Sexual Activity: Yes    Birth Control/ Protection: Surgical   Other Topics Concern  . 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      Review of Systems  Constitutional: Positive for fatigue. Negative for fever.  HENT: Negative.   Respiratory: Negative for cough, shortness of breath, wheezing and stridor.   Cardiovascular: Negative.   Gastrointestinal: Negative.   Genitourinary: Negative.   Musculoskeletal: Negative.   Skin: Negative.   Neurological: Negative.   Hematological: Negative.   Psychiatric/Behavioral: Negative.   All other systems reviewed and are negative.      Objective:    BP 144/84 mmHg  Pulse 115  Temp(Src) 98.1 F (36.7 C) (Oral)  Wt 194 lb 12 oz (88.338 kg)  SpO2 99%  LMP  12/07/2011   Physical Exam  Constitutional: She is oriented to person, place, and time. She appears well-developed and well-nourished. No distress.  HENT:  Head: Normocephalic.  Eyes: Conjunctivae are normal.  Cardiovascular: Regular rhythm.   Pulmonary/Chest: Effort normal and breath sounds normal. No respiratory distress. She has no wheezes.  Musculoskeletal: Normal range of motion.  Neurological: She is alert and oriented to person, place, and time. No cranial nerve deficit.  Skin: Skin is warm and dry.  Psychiatric: She has a normal mood and affect. Her behavior is normal. Judgment and thought content normal.  Nursing note and vitals reviewed.         Assessment & Plan:   Asthma, mild intermittent, uncomplicated  Hypokalemia - Plan: Basic metabolic panel No Follow-up on file.

## 2015-07-31 NOTE — Progress Notes (Signed)
Pre visit review using our clinic review tool, if applicable. No additional management support is needed unless otherwise documented below in the visit note. 

## 2015-07-31 NOTE — Patient Instructions (Signed)
Great to see you. I will call you with your potassium.  Happy Thanksgiving.

## 2015-07-31 NOTE — Assessment & Plan Note (Signed)
repleted in ER. Recheck BMET today. The patient indicates understanding of these issues and agrees with the plan.
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
## 2015-07-31 NOTE — Addendum Note (Signed)
Addended by: Modena Nunnery on: 07/31/2015 12:58 PM   Modules accepted: Orders

## 2015-08-03 ENCOUNTER — Other Ambulatory Visit: Payer: Self-pay | Admitting: Family Medicine

## 2015-08-04 ENCOUNTER — Other Ambulatory Visit: Payer: Self-pay | Admitting: Family Medicine

## 2015-08-10 ENCOUNTER — Telehealth: Payer: Self-pay | Admitting: Family Medicine

## 2015-08-10 NOTE — Telephone Encounter (Signed)
Melissa Allen with [facility] left v/m requesting cb about refill for ambien; Medication phoned to [place] at [facility] as instructed.

## 2015-08-10 NOTE — Telephone Encounter (Signed)
See 08/03/15 phone note. Spoke with pt and apologized and notified med called to [facility]. Pt understanding.

## 2015-08-10 NOTE — Telephone Encounter (Signed)
Pt called my private cell phone stating that she requested ambien refill prior to Thanksgiving and pharmacy states they have not received. Please look into this and ok to refill.

## 2015-08-23 ENCOUNTER — Other Ambulatory Visit: Payer: Self-pay | Admitting: Family Medicine

## 2015-08-27 ENCOUNTER — Other Ambulatory Visit: Payer: Self-pay | Admitting: Family Medicine

## 2015-08-27 NOTE — Telephone Encounter (Signed)
Last f/u 07/2015

## 2015-08-28 NOTE — Telephone Encounter (Signed)
Rx called to pharmacy as instructed. 

## 2015-08-29 ENCOUNTER — Other Ambulatory Visit: Payer: Self-pay | Admitting: Family Medicine

## 2015-09-04 ENCOUNTER — Other Ambulatory Visit: Payer: Self-pay

## 2015-09-04 MED ORDER — ZOLPIDEM TARTRATE 10 MG PO TABS: 10.0000 mg | ORAL_TABLET | Freq: Every evening | ORAL | Status: DC | PRN

## 2015-09-04 NOTE — Telephone Encounter (Signed)
Pt has been out of ambien for 2 nights; pt said she takes one tab at hs and does not understand how she is early for refill. Pt request refill to [facility]. Last refilled 08/10/15 # 30. Last seen 07/09/15 f/u.Please advise. Pt request cb. Appears med was approved on 08/03/15 but did not get called in until 08/10/15.

## 2015-09-04 NOTE — Telephone Encounter (Signed)
Rx called in to requested pharmacy. Spoke to [facility] and Regardless of when Rx was requested vs called in, pt should still have enough meds. If Rx was called into pharmacy 12/2, she should have enough through 12/1.

## 2015-09-04 NOTE — Telephone Encounter (Signed)
Pt notified via v/m(per DPR) that rx has been called in and pt notified as instructed per Avie Echevaria NP note.

## 2015-09-04 NOTE — Telephone Encounter (Signed)
Ok to phone in ambien 

## 2015-09-30 ENCOUNTER — Other Ambulatory Visit: Payer: Self-pay | Admitting: Family Medicine

## 2015-10-01 ENCOUNTER — Other Ambulatory Visit: Payer: Self-pay | Admitting: Family Medicine

## 2015-10-01 NOTE — Telephone Encounter (Signed)
Last f/u 06/2015 

## 2015-10-02 NOTE — Telephone Encounter (Signed)
Rx called in to requested pharmacy 

## 2015-10-09 ENCOUNTER — Other Ambulatory Visit: Payer: Self-pay | Admitting: Family Medicine

## 2015-10-09 NOTE — Telephone Encounter (Signed)
Insomnia last discussed 05/2014

## 2015-10-10 NOTE — Telephone Encounter (Signed)
Rx called in to requested pharmacy 

## 2015-10-16 ENCOUNTER — Other Ambulatory Visit: Payer: Self-pay | Admitting: Family Medicine

## 2015-10-25 ENCOUNTER — Encounter: Payer: Self-pay | Admitting: Internal Medicine

## 2015-11-04 ENCOUNTER — Other Ambulatory Visit: Payer: Self-pay | Admitting: Family Medicine

## 2015-11-05 NOTE — Telephone Encounter (Signed)
Last f/u 06/2015 

## 2015-11-05 NOTE — Telephone Encounter (Signed)
Rx called in to requested pharmacy 

## 2015-11-06 ENCOUNTER — Other Ambulatory Visit: Payer: Self-pay | Admitting: Family Medicine

## 2015-11-06 ENCOUNTER — Other Ambulatory Visit: Payer: Self-pay | Admitting: Neurosurgery

## 2015-11-06 DIAGNOSIS — M4316 Spondylolisthesis, lumbar region: Secondary | ICD-10-CM

## 2015-11-07 ENCOUNTER — Telehealth: Payer: Self-pay | Admitting: *Deleted

## 2015-11-07 NOTE — Telephone Encounter (Signed)
Fax received indicating pts symbicort is no longer covered by insurance. Pt will have to choose from [place], [place], Ellipta, or Dulera. Per Dr Deborra Medina, pt is to advise which medication she prefers to switch to. Lm on pts vm requesting a call back

## 2015-11-09 NOTE — Telephone Encounter (Addendum)
Pt called back and left v/m at 4:50; I called pt back; pt is not sure what med to try; pt said Symbicort works the best for her and Dr Deborra Medina told pt previously that Symbicort was the best med; pt said she showed Dr Deborra Medina the letter from the ins co and Dr Deborra Medina advised that she would appeal the decision not to cover Symbicort. Pt understands it will be first of next week when gets cb.

## 2015-11-09 NOTE — Telephone Encounter (Signed)
Please complete appeal.  See below.

## 2015-11-10 ENCOUNTER — Ambulatory Visit
Admission: RE | Admit: 2015-11-10 | Discharge: 2015-11-10 | Disposition: A | Discharge status: Home / Self Care | Payer: BC Managed Care – PPO | Source: Ambulatory Visit | Attending: Neurosurgery | Admitting: Neurosurgery

## 2015-11-10 DIAGNOSIS — M4316 Spondylolisthesis, lumbar region: Secondary | ICD-10-CM

## 2015-11-12 NOTE — Telephone Encounter (Addendum)
Pt left v/m requesting cb about PA for inhaler.

## 2015-11-12 NOTE — Telephone Encounter (Signed)
PA completed via covermymeds, and indicates a PA is not required for the requested medication. Copy sent to pharmacy and to be scanned

## 2015-11-13 MED ORDER — MOMETASONE FURO-FORMOTEROL FUM 200-5 MCG/ACT IN AERO: 2.0000 | INHALATION_SPRAY | Freq: Two times a day (BID) | RESPIRATORY_TRACT | Status: DC

## 2015-11-13 NOTE — Telephone Encounter (Signed)
Fax received indicating that PA is denied, per pharmacy. Pharmacy indicates medication is not covered, at all, and pt must choose between medications suggested in original message. See below. Spoke to pt and advised. Pt is wanting Dr Deborra Medina to choose which medication best suits her s/s, but prefers Dulera. Pt states that she is also going to try and schedule an appt with pulmonology, and will contact office back if referral is required

## 2015-11-13 NOTE — Telephone Encounter (Signed)
We can certainly try dulera.  eRx sent.

## 2015-11-13 NOTE — Telephone Encounter (Signed)
Lm on pts vm and advised regarding PA. Advised pt to contact pharmacy

## 2015-11-13 NOTE — Addendum Note (Signed)
Addended by: Lucille Passy on: 11/13/2015 01:18 PM   Modules accepted: Orders

## 2015-11-15 ENCOUNTER — Other Ambulatory Visit: Payer: BC Managed Care – PPO

## 2015-12-02 ENCOUNTER — Other Ambulatory Visit: Payer: Self-pay | Admitting: Family Medicine

## 2015-12-03 NOTE — Telephone Encounter (Signed)
Rx called in to requested pharmacy 

## 2015-12-03 NOTE — Telephone Encounter (Signed)
Last f/u 06/2015 

## 2015-12-17 ENCOUNTER — Other Ambulatory Visit: Payer: Self-pay | Admitting: Neurosurgery

## 2015-12-17 DIAGNOSIS — M4316 Spondylolisthesis, lumbar region: Secondary | ICD-10-CM

## 2015-12-24 ENCOUNTER — Ambulatory Visit
Admission: RE | Admit: 2015-12-24 | Discharge: 2015-12-24 | Disposition: A | Discharge status: Home / Self Care | Payer: BC Managed Care – PPO | Source: Ambulatory Visit | Attending: Neurosurgery | Admitting: Neurosurgery

## 2015-12-24 DIAGNOSIS — M4316 Spondylolisthesis, lumbar region: Secondary | ICD-10-CM

## 2015-12-28 ENCOUNTER — Encounter: Payer: Self-pay | Admitting: Obstetrics & Gynecology

## 2015-12-28 ENCOUNTER — Ambulatory Visit (INDEPENDENT_AMBULATORY_CARE_PROVIDER_SITE_OTHER): Payer: BC Managed Care – PPO | Admitting: Obstetrics & Gynecology

## 2015-12-28 ENCOUNTER — Telehealth: Payer: Self-pay | Admitting: Family Medicine

## 2015-12-28 VITALS — BP 138/105 | HR 118 | Resp 16 | Ht 64.0 in | Wt 194.0 lb

## 2015-12-28 DIAGNOSIS — Z01419 Encounter for gynecological examination (general) (routine) without abnormal findings: Secondary | ICD-10-CM | POA: Diagnosis not present

## 2015-12-28 NOTE — Progress Notes (Signed)
Subjective:    Melissa Allen is a 50 y.o.MW P3 (67, 73, and 45 yo kids) female who presents for an annual exam. The patient has no complaints today. The patient is sexually active. GYN screening history: last pap: was normal. The patient wears seatbelts: yes. The patient participates in regular exercise: no. Has the patient ever been transfused or tattooed?: no. The patient reports that there is not domestic violence in her life.   Menstrual History: OB History    Gravida Para Term Preterm AB TAB SAB Ectopic Multiple Living   3 3 2 1  0 0 0 0 0 3      Menarche age: 23  Patient's last menstrual period was 12/07/2011.    The following portions of the patient's history were reviewed and updated as appropriate: allergies, current medications, past family history, past medical history, past social history, past surgical history and problem list.  Review of Systems Pertinent items noted in HPI and remainder of comprehensive ROS otherwise negative.  Colonoscopy and mammogram due. She had a flu and pneumonia vaccines. Married for 7 years.   Objective:    BP 138/105 mmHg  Pulse 118  Resp 16  Ht 5\' 4"  (1.626 m)  Wt 194 lb (87.998 kg)  BMI 33.28 kg/m2  LMP 12/07/2011  General Appearance:    Alert, cooperative, no distress, appears stated age  Head:    Normocephalic, without obvious abnormality, atraumatic  Eyes:    PERRL, conjunctiva/corneas clear, EOM's intact, fundi    benign, both eyes  Ears:    Normal TM's and external ear canals, both ears  Nose:   Nares normal, septum midline, mucosa normal, no drainage    or sinus tenderness  Throat:   Lips, mucosa, and tongue normal; teeth and gums normal  Neck:   Supple, symmetrical, trachea midline, no adenopathy;    thyroid:  no enlargement/tenderness/nodules; no carotid   bruit or JVD  Back:     Symmetric, no curvature, ROM normal, no CVA tenderness  Lungs:     Clear to auscultation bilaterally, respirations unlabored  Chest Wall:    No  tenderness or deformity   Heart:    Regular rate and rhythm, S1 and S2 normal, no murmur, rub   or gallop  Breast Exam:    No tenderness, masses, or nipple abnormality  Abdomen:     Soft, non-tender, bowel sounds active all four quadrants,    no masses, no organomegaly  Genitalia:    Normal female without lesion, discharge or tenderness     Extremities:   Extremities normal, atraumatic, no cyanosis or edema  Pulses:   2+ and symmetric all extremities  Skin:   Skin color, texture, turgor normal, no rashes or lesions  Lymph nodes:   Cervical, supraclavicular, and axillary nodes normal  Neurologic:   CNII-XII intact, normal strength, sensation and reflexes    throughout  .    Assessment:    Healthy female exam.    Plan:     Mammogram.

## 2015-12-28 NOTE — Telephone Encounter (Signed)
Dr Deborra Medina out of office and not on computer; sending to physician in office.

## 2015-12-28 NOTE — Telephone Encounter (Signed)
Patient Name: Melissa Allen  DOB: 18-Jan-1966    Initial Comment Caller states, blood pressure is 138/105 now    Nurse Assessment  Nurse: Raphael Gibney, RN, Vanita Ingles Date/Time (Eastern Time): 12/28/2015 10:20:26 AM  Confirm and document reason for call. If symptomatic, describe symptoms. You must click the next button to save text entered. ---Caller states she has BP of 138/105. She takes Lisinopril/HCTZ. She is a lot of family issues right now. No headache or dizziness.  Has the patient traveled out of the country within the last 30 days? ---Not Applicable  Does the patient have any new or worsening symptoms? ---Yes  Will a triage be completed? ---Yes  Related visit to physician within the last 2 weeks? ---No  Does the PT have any chronic conditions? (i.e. diabetes, asthma, etc.) ---Yes  List chronic conditions. ---HTN  Is the patient pregnant or possibly pregnant? (Ask all females between the ages of 51-55) ---No  Is this a behavioral health or substance abuse call? ---No     Guidelines    Guideline Title Affirmed Question Affirmed Notes  High Blood Pressure Wants doctor to measure BP    Final Disposition User   See PCP within 2 Maudry Diego, RN, Vanita Ingles    Comments  P states she has appt with neurosurgeon on 01/01/16 and will have BP checked then. If it is still elevated, she will call for appt.   Referrals  REFERRED TO PCP OFFICE   Disagree/Comply: Comply

## 2015-12-28 NOTE — Telephone Encounter (Signed)
Agreed -

## 2015-12-31 ENCOUNTER — Encounter: Payer: Self-pay | Admitting: Family Medicine

## 2015-12-31 ENCOUNTER — Ambulatory Visit (INDEPENDENT_AMBULATORY_CARE_PROVIDER_SITE_OTHER): Payer: BC Managed Care – PPO | Admitting: Family Medicine

## 2015-12-31 ENCOUNTER — Other Ambulatory Visit: Payer: Self-pay | Admitting: Family Medicine

## 2015-12-31 VITALS — BP 142/98 | HR 110 | Temp 98.3°F | Wt 192.2 lb

## 2015-12-31 DIAGNOSIS — I10 Essential (primary) hypertension: Secondary | ICD-10-CM

## 2015-12-31 DIAGNOSIS — R0789 Other chest pain: Secondary | ICD-10-CM | POA: Diagnosis not present

## 2015-12-31 MED ORDER — LOSARTAN POTASSIUM 50 MG PO TABS: 50.0000 mg | ORAL_TABLET | Freq: Every day | ORAL | Status: DC

## 2015-12-31 NOTE — Progress Notes (Signed)
Pre visit review using our clinic review tool, if applicable. No additional management support is needed unless otherwise documented below in the visit note. 

## 2015-12-31 NOTE — Patient Instructions (Addendum)
Happy birthday! labwork today, urine check, EKG today. Restart losartan 50mg  daily - sent to pharmacy.  Follow up with PCP in 3-4 weeks.

## 2015-12-31 NOTE — Assessment & Plan Note (Addendum)
Deteriorated control recently without clear trigger found. Chronic back pain could trigger hypertensive/tachycardic response. Denies recent albuterol use.  With endorsed diaphoretic episodes associated with headache, hypertension and tachycardia, will eval for pheochromocytoma (24hr urine catecholamines/metanephrines).  In interim, Continue maxzide, restart losartan 50mg  daily which previously was maintaining control. Check labwork (TSH, BMP). Check EKG today - sinus tachycardia 110s, normal axis, intervals, nonspecific precordial T flattening with good R wave progression

## 2015-12-31 NOTE — Progress Notes (Signed)
BP 142/98 mmHg  Pulse 110  Temp(Src) 98.3 F (36.8 C) (Oral)  Wt 192 lb 4 oz (87.204 kg)  SpO2 99%  LMP 12/07/2011   CC: HTN  Subjective:    Patient ID: Melissa Allen, female    DOB: 1965/11/12, 50 y.o.   MRN: [id number]  HPI: Melissa Allen is a 50 y.o. female presenting on 12/31/2015 for Hypertension   Dr Hulen Shouts patient presents today with concerns for recently uncontrolled hypertension despite compliance with maxzide 37.5/25mg  daily. bp at [facility] office elevated to 138/105 on Friday. She brings home log over weekend - consistently 140-150/90-100. Mild tachycardia as well.   Ongoing malaise over last several months. Endorses headache with dizziness over the past week.  Some hand and leg swelling. Endorses occasional chest discomfort and malaise, not exertion related, came on at rest. Chest pain described as dull ache that lasted 15-86min. Also noticing worsening indigestion. No low blood pressure readings or symptoms of dizziness/syncope. Denies vision changes, SOB. Father with stress induced heart disease at age 18s.   Currently suffering from self-endorsed "hot flash" - diaphoresis, flush, mild headache and chest discomfort, restless feeling.   On cymbalta for fibromyalgia - for last 7 yrs.  H/o asthma - triggers are smoke. Avoiding ACEI and beta blocker.   Perimenopausal.  No recent prolonged car or plane rides. No fmhx blood clots.  No recent change in diet - avoids salt/sodium in diet. Knows to avoid NSAIDs, decongestants.   Recent prolonged recovery after back surgery 2016.   Relevant past medical, surgical, family and social history reviewed and updated as indicated. Interim medical history since our last visit reviewed. Allergies and medications reviewed and updated. Current Outpatient Prescriptions on File Prior to Visit  Medication Sig  . ALPRAZolam (XANAX) 0.25 MG tablet TAKE ONE OR TWO TABLETS PER DAY  . cholecalciferol (VITAMIN D) 1000 UNITS tablet Take 1,000 Units  by mouth daily.  . cyclobenzaprine (FLEXERIL) 10 MG tablet Take 10 mg by mouth as needed for muscle spasms (muscle spasms). Take 1 tablet (10 mg) daily at bedtime, may take an additional tablet during the day as needed for muscle spasms  . diphenhydrAMINE (BENADRYL) 25 MG tablet Take 25 mg by mouth every 6 (six) hours as needed for itching.  . DULoxetine (CYMBALTA) 60 MG capsule Take 1 capsule (60 mg total) by mouth at bedtime.  . fluticasone (FLONASE) 50 MCG/ACT nasal spray USE 2 SPRAYS INTO EACH NOSTRIL ONCE DAILY AS DIRECTED.  Marland Kitchen HYDROcodone-acetaminophen (NORCO) 10-325 MG per tablet Take 2 tablets by mouth every 4 (four) hours as needed for moderate pain or severe pain.   Marland Kitchen ipratropium-albuterol (DUONEB) 0.5-2.5 (3) MG/3ML SOLN Take 3 mLs by nebulization every 4 (four) hours as needed. (Patient taking differently: Take 3 mLs by nebulization every 4 (four) hours as needed (shortness of breath/wheezing). )  . Magnesium 250 MG TABS Take 250 mg by mouth daily.  . Meth-Hyo-M Bl-Na Phos-Ph Sal (URIBEL) 118 MG CAPS   . mometasone-formoterol (DULERA) 200-5 MCG/ACT AERO Inhale 2 puffs into the lungs 2 (two) times daily.  Marland Kitchen PROAIR HFA 108 (90 BASE) MCG/ACT inhaler INHALE TWO PUFFS EVERY FOUR HOURS AS NEEDED SHORTNESS OF BREATH  . triamterene-hydrochlorothiazide (MAXZIDE-25) 37.5-25 MG tablet TAKE 1 TABLET BY MOUTH DAILY  . Turmeric 500 MG CAPS Take 500 mg by mouth daily.  . vitamin B-12 (CYANOCOBALAMIN) 1000 MCG tablet Take 1,000 mcg by mouth daily.  Marland Kitchen zolpidem (AMBIEN) 10 MG tablet TAKE ONE TABLET BY MOUTH EVERY  NIGHT AT BEDTIME   No current facility-administered medications on file prior to visit.    Review of Systems Per HPI unless specifically indicated in ROS section     Objective:    BP 142/98 mmHg  Pulse 110  Temp(Src) 98.3 F (36.8 C) (Oral)  Wt 192 lb 4 oz (87.204 kg)  SpO2 99%  LMP 12/07/2011  Wt Readings from Last 3 Encounters:  12/31/15 192 lb 4 oz (87.204 kg)  12/28/15 194 lb  (87.998 kg)  07/31/15 194 lb 12 oz (88.338 kg)   Body mass index is 32.98 kg/(m^2).  Physical Exam  Constitutional: She appears well-developed and well-nourished. No distress.  Flushed in office, sweating waving book as fan  HENT:  Mouth/Throat: Oropharynx is clear and moist. No oropharyngeal exudate.  Neck: No thyromegaly present.  Cardiovascular: Regular rhythm, normal heart sounds and intact distal pulses.  Tachycardia present.   No murmur heard. Pulmonary/Chest: Effort normal and breath sounds normal. No respiratory distress. She has no wheezes. She has no rales.  Musculoskeletal: She exhibits no edema.  Skin: Skin is warm and dry. No rash noted.  Psychiatric:  Restless Also unable to sit for period of time 2/2 back pain  Nursing note and vitals reviewed.  Results for orders placed or performed in visit on [id number]  Basic metabolic panel  Result Value Ref Range   Sodium 139 135 - 145 mEq/L   Potassium 3.5 3.5 - 5.1 mEq/L   Chloride 101 96 - 112 mEq/L   CO2 29 19 - 32 mEq/L   Glucose, Bld 105 (H) 70 - 99 mg/dL   BUN 16 6 - 23 mg/dL   Creatinine, Ser 0.82 0.40 - 1.20 mg/dL   Calcium 10.1 8.4 - 10.5 mg/dL   GFR 78.25 >60.00 mL/min      Assessment & Plan:   Problem List Items Addressed This Visit    Essential hypertension - Primary    Deteriorated control recently without clear trigger found. Chronic back pain could trigger hypertensive/tachycardic response. Denies recent albuterol use.  With endorsed diaphoretic episodes associated with headache, hypertension and tachycardia, will eval for pheochromocytoma (24hr urine catecholamines/metanephrines).  In interim, Continue maxzide, restart losartan 50mg  daily which previously was maintaining control. Check labwork (TSH, BMP). Check EKG today - sinus tachycardia 110s, normal axis, intervals, nonspecific precordial T flattening with good R wave progression      Relevant Medications   losartan (COZAAR) 50 MG tablet   Other  Relevant Orders   TSH   Basic metabolic panel   EKG [id number] (Completed)   Metanephrines, urine, 24 hour   Catecholamines, fractionated, urine, 24 hour   Troponin I   Chest discomfort    Intermittent chest discomfort over the past week that does not sound cardiac in nature. Risk factors include uncontrolled hypertension, hyperlipidemia, family history.  Check EKG today, check TnI today.       Relevant Orders   Troponin I       Follow up plan: Return in about 3 weeks (around 01/21/2016), or if symptoms worsen or fail to improve, for follow up visit.  Ria Bush, MD

## 2015-12-31 NOTE — Assessment & Plan Note (Addendum)
Intermittent chest discomfort over the past week that does not sound cardiac in nature. Risk factors include uncontrolled hypertension, hyperlipidemia, family history.  Check EKG today, check TnI today.

## 2016-01-01 LAB — BASIC METABOLIC PANEL
BUN: 21 mg/dL (ref 6–23)
CHLORIDE: 97 meq/L (ref 96–112)
CO2: 31 meq/L (ref 19–32)
Calcium: 10.9 mg/dL — ABNORMAL HIGH (ref 8.4–10.5)
Creatinine, Ser: 0.91 mg/dL (ref 0.40–1.20)
GFR: 69.27 mL/min (ref >60.00)
GLUCOSE: 97 mg/dL (ref 70–99)
POTASSIUM: 3.8 meq/L (ref 3.5–5.1)
Sodium: 139 mEq/L (ref 135–145)

## 2016-01-01 LAB — TROPONIN I: Troponin I: <0.01 ng/mL (ref <=0.05)

## 2016-01-01 LAB — TSH: TSH: 1.21 u[IU]/mL (ref 0.35–4.50)

## 2016-01-01 NOTE — Telephone Encounter (Signed)
Last f/u 06/2015 

## 2016-01-01 NOTE — Telephone Encounter (Addendum)
Rx called in to requested pharmacy 

## 2016-01-02 NOTE — Addendum Note (Signed)
Addended by: Daralene Milch C on: 01/02/2016 10:12 AM   Modules accepted: Orders, SmartSet

## 2016-01-05 ENCOUNTER — Other Ambulatory Visit: Payer: Self-pay | Admitting: Family Medicine

## 2016-01-06 LAB — METANEPHRINES, URINE, 24 HOUR
METANEPHRINES UR: 90 ug/(24.h) (ref 90–315)
Metaneph Total, Ur: 354 mcg/24 h (ref 224–832)
Normetanephrine, 24H Ur: 264 mcg/24 h (ref 122–676)

## 2016-01-06 LAB — CATECHOLAMINES, FRACTIONATED, URINE, 24 HOUR
CALCULATED TOTAL (E+ NE): 102 ug/(24.h) (ref 26–121)
CREATININE, URINE MG/DAY-CATEUR: 1.13 g/(24.h) (ref 0.63–2.50)
DOPAMINE, 24 HR URINE: 146 ug/(24.h) (ref 52–480)
EPINEPHRINE, 24 HR URINE: 5 ug/(24.h) (ref 2–24)
NOREPINEPHRINE, 24 HR UR: 97 ug/(24.h) (ref 15–100)
Total Volume - CF 24Hr U: 700 mL

## 2016-01-08 ENCOUNTER — Other Ambulatory Visit: Payer: Self-pay | Admitting: Family Medicine

## 2016-01-08 ENCOUNTER — Encounter: Payer: Self-pay | Admitting: Family Medicine

## 2016-01-08 ENCOUNTER — Ambulatory Visit (INDEPENDENT_AMBULATORY_CARE_PROVIDER_SITE_OTHER): Payer: BC Managed Care – PPO | Admitting: Family Medicine

## 2016-01-08 VITALS — BP 128/94 | HR 108 | Temp 98.0°F | Wt 197.5 lb

## 2016-01-08 DIAGNOSIS — N951 Menopausal and female climacteric states: Secondary | ICD-10-CM | POA: Diagnosis not present

## 2016-01-08 DIAGNOSIS — R0789 Other chest pain: Secondary | ICD-10-CM | POA: Diagnosis not present

## 2016-01-08 DIAGNOSIS — I10 Essential (primary) hypertension: Secondary | ICD-10-CM

## 2016-01-08 DIAGNOSIS — R232 Flushing: Secondary | ICD-10-CM

## 2016-01-08 LAB — LUTEINIZING HORMONE: LH: 43.03 m[IU]/mL

## 2016-01-08 LAB — COMPREHENSIVE METABOLIC PANEL
ALBUMIN: 4.4 g/dL (ref 3.5–5.2)
ALK PHOS: 117 U/L (ref 39–117)
ALT: 23 U/L (ref 0–35)
AST: 26 U/L (ref 0–37)
BUN: 17 mg/dL (ref 6–23)
CO2: 29 mEq/L (ref 19–32)
Calcium: 10 mg/dL (ref 8.4–10.5)
Chloride: 101 mEq/L (ref 96–112)
Creatinine, Ser: 0.65 mg/dL (ref 0.40–1.20)
GFR: 102.13 mL/min (ref >60.00)
Glucose, Bld: 90 mg/dL (ref 70–99)
POTASSIUM: 3.5 meq/L (ref 3.5–5.1)
Sodium: 140 mEq/L (ref 135–145)
TOTAL PROTEIN: 7.4 g/dL (ref 6.0–8.3)
Total Bilirubin: 0.4 mg/dL (ref 0.2–1.2)

## 2016-01-08 LAB — FOLLICLE STIMULATING HORMONE: FSH: 106.3 m[IU]/mL

## 2016-01-08 NOTE — Assessment & Plan Note (Signed)
Likely multifactorial but she does ask about menopause which is certainly a possibility. Remote h/o hysterectomy. Check FSH and LH today. We did discuss possibly starting HRT pending the results of labs drawn today. The patient indicates understanding of these issues and agrees with the plan.

## 2016-01-08 NOTE — Telephone Encounter (Signed)
Printed to be given to pt at [facility]

## 2016-01-08 NOTE — Progress Notes (Signed)
Subjective:   Patient ID: Melissa Allen, female    DOB: [birth year], 50 y.o.   MRN: [id number]  Melissa Allen is a pleasant 50 y.o. year old female who presents to clinic today with Follow-up  on 01/08/2016  HPI: HTN- Recently deteriorated. Saw my partner, Dr. Darnell Level, on 12/31/15 for worsening BP and chest pain. Note reviewed. Advised to continue Maxzide and was also started on Losartan 50 mg daily. TSH, BMET unremarkable except for elevated calcium.  Urine Catecholamines neg (no indication of pheochromocytoma).  Feels better but still having episodes of hot flashes and facial flushing.  No further episodes of CP.  She is having worsening back pain.  Recently started going to the pool again twice a week as this has helped in past with her pain.  Lab Results  Component Value Date   TSH 1.21 12/31/2015   Lab Results  Component Value Date   NA 139 12/31/2015   K 3.8 12/31/2015   CL 97 12/31/2015   CO2 31 12/31/2015   Lab Results  Component Value Date   CREATININE 0.91 12/31/2015   Current Outpatient Prescriptions on File Prior to Visit  Medication Sig Dispense Refill  . cholecalciferol (VITAMIN D) 1000 UNITS tablet Take 1,000 Units by mouth daily.    . cyclobenzaprine (FLEXERIL) 10 MG tablet Take 10 mg by mouth as needed for muscle spasms (muscle spasms). Take 1 tablet (10 mg) daily at bedtime, may take an additional tablet during the day as needed for muscle spasms    . diphenhydrAMINE (BENADRYL) 25 MG tablet Take 25 mg by mouth every 6 (six) hours as needed for itching.    . DULoxetine (CYMBALTA) 60 MG capsule Take 1 capsule (60 mg total) by mouth at bedtime. 90 capsule 1  . fluticasone (FLONASE) 50 MCG/ACT nasal spray USE 2 SPRAYS INTO EACH NOSTRIL ONCE DAILY AS DIRECTED. 16 g 5  . HYDROcodone-acetaminophen (NORCO) 10-325 MG per tablet Take 2 tablets by mouth every 4 (four) hours as needed for moderate pain or severe pain.     Marland Kitchen ipratropium-albuterol (DUONEB) 0.5-2.5 (3) MG/3ML  SOLN Take 3 mLs by nebulization every 4 (four) hours as needed. (Patient taking differently: Take 3 mLs by nebulization every 4 (four) hours as needed (shortness of breath/wheezing). ) 360 mL 1  . losartan (COZAAR) 50 MG tablet Take 1 tablet (50 mg total) by mouth daily. 30 tablet 6  . Magnesium 250 MG TABS Take 250 mg by mouth daily.    . Meth-Hyo-M Bl-Na Phos-Ph Sal (URIBEL) 118 MG CAPS     . mometasone-formoterol (DULERA) 200-5 MCG/ACT AERO Inhale 2 puffs into the lungs 2 (two) times daily. 1 Inhaler 3  . PROAIR HFA 108 (90 BASE) MCG/ACT inhaler INHALE TWO PUFFS EVERY FOUR HOURS AS NEEDED SHORTNESS OF BREATH 8.5 g 2  . triamterene-hydrochlorothiazide (MAXZIDE-25) 37.5-25 MG tablet TAKE 1 TABLET BY MOUTH DAILY 90 tablet 2  . Turmeric 500 MG CAPS Take 500 mg by mouth daily.    . vitamin B-12 (CYANOCOBALAMIN) 1000 MCG tablet Take 1,000 mcg by mouth daily.    Marland Kitchen zolpidem (AMBIEN) 10 MG tablet TAKE ONE TABLET BY MOUTH EVERY NIGHT AT BEDTIME 30 tablet 0   No current facility-administered medications on file prior to visit.    Allergies  Allergen Reactions  . Codeine Nausea And Vomiting  . Lisinopril Cough       . Hydrocodone Itching    Causes minor itching/ pt is currently taking (04/27/15)  . Tape  Rash    Irritation to inside of thigh, /w PINK tape, can tolerate paper tape     Past Medical History  Diagnosis Date  . Allergy   . Calcaneal fracture   . Obesity (BMI 30.0-34.9)   . Asthma     recent admit to ER 01/11/12 for exac of asthma  . Hypertension   . Fibromyalgia   . Anginal pain ([place])     one episode 4-5 years ago-[facility] -nonspecific-no problems since-  . PONV (postoperative nausea and vomiting)   . Kidney stones   . Duplicated ureter, right   . Poor venous access     hard to start iv-  . Wears glasses   . Shortness of breath dyspnea   . Anxiety   . 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  Arthritis     multiple areas of arthritis- DDD  . Pancreatitis 1986    Past Surgical History  Procedure Laterality Date  . Tonsillectomy  age 28  . Bunionectomy  1995    Right  . Cholecystectomy  late 1980's       . Lumbar laminectomy  10/06    L4-5, Ray  . Knee surgery Left     x 2, Murphy/Sue, corrected Patella displacement   . R compressed pronator Right     Sypher, R arm  . Tendon repair  2013  . Tendon repair Right 02/01/12  . Bladder suspension  03/01/2012    Procedure: TRANSVAGINAL TAPE (TVT) PROCEDURE;  Surgeon: Emily Filbert, MD;  Location: [facility];  Service: Gynecology;  Laterality: N/A;  . Cystoscopy  03/01/2012    Procedure: CYSTOSCOPY;  Surgeon: Emily Filbert, MD;  Location: [facility];  Service: Gynecology;  Laterality: N/A;  . Abdominal hysterectomy    . Incision and drainage abscess Left 07/19/2013    Procedure: INCISION AND DRAINAGE LEFT LOWER EXTERMITY HEMATOMA;  Surgeon: Imogene Burn. Georgette Dover, MD;  Location: [facility];  Service: General;  Laterality: Left;  Excision left lower leg mass  . Cystoscopy w/ ureteral stent placement Left 12/15/2014    Procedure: CYSTOSCOPY, LEFT URETEROSCOPYU WITH STONE EXTRACTION;  Surgeon: Irine Seal, MD;  Location: [facility];  Service: Urology;  Laterality: Left;  . Spinal fusion    . Laminectomy      Family History  Problem Relation Age of Onset  . Heart disease Father 80  . Colon polyps Father   . Colon cancer Paternal Grandfather   . Colon cancer Maternal Grandmother   . Uterine cancer      Grandmother  . Breast cancer      Grandmother  . Ovarian cancer      Grandmother  . Diabetes      mother  . Other Neg Hx   . Cancer Mother     pancreatic cancer    Social History   Social History  . Marital Status: Married    Spouse Name: N/A  . Number of Children: 3  . Years of Education: N/A   Occupational History  . 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 Smoking status: Never Smoker     . Smokeless tobacco: Never Used  . Alcohol Use: 3.5 oz/week    7 Standard drinks or equivalent per week     Comment: Occasional- wine or liquor   . Drug Use: No  .  Sexual Activity: Yes    Birth Control/ Protection: Surgical   Other Topics Concern  . 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   Review of Systems  Constitutional: Positive for fatigue.  HENT: Negative.   Respiratory: Negative.   Cardiovascular: Negative.   Endocrine: Positive for heat intolerance.  Musculoskeletal: Positive for back pain.  Psychiatric/Behavioral: Positive for sleep disturbance.  All other systems reviewed and are negative.      Objective:    BP 128/94 mmHg  Pulse 108  Temp(Src) 98 F (36.7 C) (Oral)  Wt 197 lb 8 oz (89.585 kg)  SpO2 97%  LMP 12/07/2011 BP Readings from Last 3 Encounters:  01/08/16 128/94  12/31/15 142/98  12/28/15 138/105     Physical Exam  Constitutional: She is oriented to person, place, and time. She appears well-developed and well-nourished. No distress.  Eyes: Conjunctivae are normal.  Neck: Normal range of motion.  Cardiovascular: Regular rhythm.  Tachycardia present.   Pulmonary/Chest: Effort normal and breath sounds normal. No respiratory distress. She has no wheezes.  Musculoskeletal: Normal range of motion.  Neurological: She is alert and oriented to person, place, and time. No cranial nerve deficit.  Skin: Skin is warm and dry. She is not diaphoretic.  Psychiatric: Her mood appears anxious.  Nursing note and vitals reviewed.         Assessment & Plan:   Essential hypertension - Plan: Comprehensive metabolic panel  Chest discomfort  Hot flashes  - Plan: Luteinizing hormone, Follicle Stimulating Hormone No Follow-up on file.

## 2016-01-08 NOTE — Progress Notes (Signed)
Pre visit review using our clinic review tool, if applicable. No additional management support is needed unless otherwise documented below in the visit note. 

## 2016-01-08 NOTE — Assessment & Plan Note (Addendum)
Improved with losartan. Continue current rxs. Recheck CMET today. ? Recent deterioration due to back pain.

## 2016-01-08 NOTE — Assessment & Plan Note (Signed)
Resolved

## 2016-01-09 ENCOUNTER — Other Ambulatory Visit: Payer: Self-pay | Admitting: Family Medicine

## 2016-01-09 MED ORDER — ESTROGENS CONJUGATED 0.3 MG PO TABS: 0.3000 mg | ORAL_TABLET | Freq: Every day | ORAL | Status: DC

## 2016-01-21 ENCOUNTER — Ambulatory Visit (INDEPENDENT_AMBULATORY_CARE_PROVIDER_SITE_OTHER): Payer: BC Managed Care – PPO | Admitting: Family Medicine

## 2016-01-21 ENCOUNTER — Encounter: Payer: Self-pay | Admitting: Family Medicine

## 2016-01-21 VITALS — BP 120/82 | HR 92 | Temp 98.2°F | Ht 64.0 in | Wt 197.6 lb

## 2016-01-21 DIAGNOSIS — M542 Cervicalgia: Secondary | ICD-10-CM | POA: Diagnosis not present

## 2016-01-21 DIAGNOSIS — J309 Allergic rhinitis, unspecified: Secondary | ICD-10-CM

## 2016-01-21 NOTE — Assessment & Plan Note (Signed)
Patient with intermittent chronic cervical spine pain with possible paresthesias versus possible positional numbness and tingling in her arms. She is neurologically intact today. I discussed options for management including imaging through our office versus follow-up with her neurosurgeon. She opted for follow-up with her neurosurgeon. She is given return precautions.

## 2016-01-21 NOTE — Assessment & Plan Note (Signed)
Symptoms most likely related to allergic rhinitis and eustachian tube dysfunction. Benign exam today. Discussed continuing Flonase. She will add Zyrtec. Advised on use of pseudoephedrine as a single dose given her history of blood pressure issues. She'll continue to monitor. Given return precautions.

## 2016-01-21 NOTE — Progress Notes (Signed)
Patient ID: Melissa Allen, female   DOB: August 31, 1966, 50 y.o.   MRN: [id number]  Tommi Rumps, MD Phone: [phone]  Melissa Allen is a 50 y.o. female who presents today for same-day appointment.  Right ear pain: Patient notes for the last week she has felt as though her right ear is full and it pops. She does not note any sinus congestion though has had postnasal drip and some rhinorrhea with itchy eyes. Notes it hurts in the area of the eustachian tube. She reports a history of a deviated septum. Minimal vertigo symptoms with this that last briefly and resolved quickly. Not positional. No fevers. She feels well overall.  She additionally notes she has had some mild neck discomfort. States there is a crick in it after sleeping incorrectly. She does note for some time now she has had intermittent tingling and numbness in her hands after sleeping. Doesn't occur at any other time. She does have a history of degenerative disc disease in her neck. She also had a lumbar spinal surgery about 8 months ago and is followed by neurosurgery. No numbness or weakness or tingling now. No neck pain at this time.  PMH: nonsmoker.   ROS see history of present illness  Objective  Physical Exam Filed Vitals:   01/21/16 1550  BP: 120/82  Pulse: 92  Temp: 98.2 F (36.8 C)    BP Readings from Last 3 Encounters:  01/21/16 120/82  01/08/16 128/94  12/31/15 142/98   Wt Readings from Last 3 Encounters:  01/21/16 197 lb 9.6 oz (89.631 kg)  01/08/16 197 lb 8 oz (89.585 kg)  12/31/15 192 lb 4 oz (87.204 kg)    Physical Exam  Constitutional: She is well-developed, well-nourished, and in no distress.  HENT:  Head: Normocephalic and atraumatic.  Right Ear: External ear normal.  Left Ear: External ear normal.  Mouth/Throat: Oropharynx is clear and moist. No oropharyngeal exudate.  Normal TMs bilaterally  Eyes: Conjunctivae are normal. Pupils are equal, round, and reactive to light.  Neck: Neck  supple.  Mild tenderness over the anterior cervical chain  Cardiovascular: Normal rate, regular rhythm and normal heart sounds.   Pulmonary/Chest: Effort normal and breath sounds normal.  Musculoskeletal:  No midline spine tenderness, no midline spine step-off, no muscular back tenderness, lumbar spine with well-healed midline scar  Lymphadenopathy:    She has no cervical adenopathy.  Neurological:  5/5 strength in bilateral biceps, triceps, grip, quads, hamstrings, plantar and dorsiflexion, sensation to light touch intact in bilateral UE and LE, normal gait, 2+ patellar reflexes  Skin: Skin is warm and dry. She is not diaphoretic.     Assessment/Plan: Please see individual problem list.  Allergic rhinitis Symptoms most likely related to allergic rhinitis and eustachian tube dysfunction. Benign exam today. Discussed continuing Flonase. She will add Zyrtec. Advised on use of pseudoephedrine as a single dose given her history of blood pressure issues. She'll continue to monitor. Given return precautions.  Neck pain Patient with intermittent chronic cervical spine pain with possible paresthesias versus possible positional numbness and tingling in her arms. She is neurologically intact today. I discussed options for management including imaging through our office versus follow-up with her neurosurgeon. She opted for follow-up with her neurosurgeon. She is given return precautions.    Tommi Rumps, MD [place]

## 2016-01-21 NOTE — Progress Notes (Signed)
Pre visit review using our clinic review tool, if applicable. No additional management support is needed unless otherwise documented below in the visit note. 

## 2016-01-21 NOTE — Patient Instructions (Signed)
Nice to meet you. Your ear discomfort is likely related to allergic rhinitis and eustachian tube dysfunction. You should continue Flonase. You can add over-the-counter Zyrtec for this. You could try a single dose of pseudoephedrine though be wary that this could cause your pressure to increase. Please contact your neurosurgeon for follow-up regarding her neck. If you develop fevers, numbness, weakness, tingling, or any new or changing symptoms please seek medical attention.

## 2016-01-29 ENCOUNTER — Other Ambulatory Visit: Payer: Self-pay | Admitting: Family Medicine

## 2016-01-30 NOTE — Telephone Encounter (Signed)
Last f/u 06/2015

## 2016-01-30 NOTE — Telephone Encounter (Signed)
Rx called in to requested pharmacy 

## 2016-02-07 ENCOUNTER — Other Ambulatory Visit: Payer: Self-pay | Admitting: Family Medicine

## 2016-02-07 NOTE — Telephone Encounter (Signed)
Rx called in to requested pharmacy 

## 2016-02-07 NOTE — Telephone Encounter (Signed)
Last f/u 06/2015

## 2016-02-28 ENCOUNTER — Other Ambulatory Visit: Payer: Self-pay | Admitting: Family Medicine

## 2016-02-29 NOTE — Telephone Encounter (Signed)
Last f/u 06/2015

## 2016-02-29 NOTE — Telephone Encounter (Signed)
Rx called in to requested pharmacy 

## 2016-03-10 ENCOUNTER — Other Ambulatory Visit: Payer: Self-pay | Admitting: *Deleted

## 2016-03-10 MED ORDER — ALPRAZOLAM 0.25 MG PO TABS: 0.2500 mg | ORAL_TABLET | Freq: Every day | ORAL | Status: DC

## 2016-03-10 NOTE — Telephone Encounter (Signed)
Last f/u 06/2015

## 2016-03-10 NOTE — Telephone Encounter (Signed)
Rx called in to requested pharmacy 

## 2016-03-18 ENCOUNTER — Telehealth: Payer: Self-pay

## 2016-03-18 MED ORDER — ALBUTEROL SULFATE HFA 108 (90 BASE) MCG/ACT IN AERS
INHALATION_SPRAY | RESPIRATORY_TRACT | Status: DC
Start: 2016-03-18 — End: 2016-08-27

## 2016-03-18 NOTE — Telephone Encounter (Signed)
[place] with [facility] left v/m; pt is out of town and had asthma attack and needs rx for rescue inhaler; pt has used [facility] but needs rescue inhaler.Please advise.last seen 01/08/16. [place] request cb.

## 2016-03-18 NOTE — Telephone Encounter (Signed)
Lm on pts vm requesting a call back to update on how she is feeling.

## 2016-03-18 NOTE — Telephone Encounter (Signed)
eRx sent.  Please call to check on pt.

## 2016-03-21 ENCOUNTER — Other Ambulatory Visit: Payer: Self-pay | Admitting: Family Medicine

## 2016-04-01 ENCOUNTER — Other Ambulatory Visit: Payer: Self-pay | Admitting: *Deleted

## 2016-04-01 MED ORDER — ZOLPIDEM TARTRATE 10 MG PO TABS: 10.0000 mg | ORAL_TABLET | Freq: Every day | ORAL | 0 refills | Status: DC

## 2016-04-01 NOTE — Telephone Encounter (Signed)
Last f/u 06/2015

## 2016-04-01 NOTE — Telephone Encounter (Signed)
Rx called in to requested pharmacy 

## 2016-04-10 ENCOUNTER — Other Ambulatory Visit: Payer: Self-pay | Admitting: *Deleted

## 2016-04-10 MED ORDER — ALPRAZOLAM 0.25 MG PO TABS: 0.2500 mg | ORAL_TABLET | Freq: Every day | ORAL | 0 refills | Status: DC

## 2016-04-10 NOTE — Telephone Encounter (Signed)
Last f/u 06/2015-CPE 

## 2016-04-11 NOTE — Telephone Encounter (Signed)
Rx called in to requested pharmacy 

## 2016-04-30 ENCOUNTER — Other Ambulatory Visit: Payer: Self-pay | Admitting: *Deleted

## 2016-04-30 MED ORDER — ZOLPIDEM TARTRATE 10 MG PO TABS: 10.0000 mg | ORAL_TABLET | Freq: Every day | ORAL | 0 refills | Status: DC

## 2016-04-30 NOTE — Telephone Encounter (Signed)
Last f/u 06/2015

## 2016-04-30 NOTE — Telephone Encounter (Signed)
Rx called in to requested pharmacy 

## 2016-05-03 ENCOUNTER — Other Ambulatory Visit: Payer: Self-pay | Admitting: Family Medicine

## 2016-05-14 ENCOUNTER — Other Ambulatory Visit: Payer: Self-pay | Admitting: *Deleted

## 2016-05-14 MED ORDER — ALPRAZOLAM 0.25 MG PO TABS: 0.2500 mg | ORAL_TABLET | Freq: Every day | ORAL | 0 refills | Status: DC

## 2016-05-14 NOTE — Telephone Encounter (Signed)
Last f/u 06/2015

## 2016-05-14 NOTE — Telephone Encounter (Signed)
Rx called in to requested pharmacy 

## 2016-05-30 ENCOUNTER — Other Ambulatory Visit: Payer: Self-pay

## 2016-05-30 MED ORDER — ZOLPIDEM TARTRATE 10 MG PO TABS: 10.0000 mg | ORAL_TABLET | Freq: Every day | ORAL | 0 refills | Status: DC

## 2016-05-30 NOTE — Telephone Encounter (Signed)
Bea at [facility] left v/m requesting refill ambien. Last refilled # 30 on 04/30/16. Pt last seen 01/08/16. Pt will be out of med today and does not want to be out of med over weekend.

## 2016-06-02 NOTE — Telephone Encounter (Signed)
Called into [facility],

## 2016-06-13 ENCOUNTER — Other Ambulatory Visit: Payer: Self-pay

## 2016-06-13 ENCOUNTER — Telehealth: Payer: Self-pay | Admitting: Family Medicine

## 2016-06-13 MED ORDER — ALPRAZOLAM 0.25 MG PO TABS: 0.2500 mg | ORAL_TABLET | Freq: Every day | ORAL | 0 refills | Status: DC

## 2016-06-13 NOTE — Telephone Encounter (Signed)
Ok to refill as requested 

## 2016-06-13 NOTE — Telephone Encounter (Signed)
Pt called regarding her xanax refill.  She said that [facility] has faxed over the refill request and she is out so she needs it called in asap.  Thanks!

## 2016-06-13 NOTE — Telephone Encounter (Signed)
RX CALLED IN TO [facility].

## 2016-06-15 ENCOUNTER — Other Ambulatory Visit: Payer: Self-pay | Admitting: Family Medicine

## 2016-06-16 NOTE — Telephone Encounter (Signed)
Last refill 03/24/16 #90, last OV 01/08/16. Ok to refill?

## 2016-06-30 ENCOUNTER — Other Ambulatory Visit: Payer: Self-pay | Admitting: Family Medicine

## 2016-07-01 ENCOUNTER — Other Ambulatory Visit: Payer: Self-pay | Admitting: Family Medicine

## 2016-07-02 ENCOUNTER — Ambulatory Visit (INDEPENDENT_AMBULATORY_CARE_PROVIDER_SITE_OTHER): Payer: BC Managed Care – PPO | Admitting: Family Medicine

## 2016-07-02 ENCOUNTER — Other Ambulatory Visit: Payer: Self-pay | Admitting: *Deleted

## 2016-07-02 ENCOUNTER — Encounter: Payer: Self-pay | Admitting: Family Medicine

## 2016-07-02 VITALS — BP 110/80 | HR 120 | Temp 99.0°F | Wt 200.4 lb

## 2016-07-02 DIAGNOSIS — R0981 Nasal congestion: Secondary | ICD-10-CM | POA: Diagnosis not present

## 2016-07-02 DIAGNOSIS — R05 Cough: Secondary | ICD-10-CM

## 2016-07-02 DIAGNOSIS — J4521 Mild intermittent asthma with (acute) exacerbation: Secondary | ICD-10-CM

## 2016-07-02 DIAGNOSIS — R059 Cough, unspecified: Secondary | ICD-10-CM

## 2016-07-02 MED ORDER — PREDNISONE 20 MG PO TABS: 20.0000 mg | ORAL_TABLET | Freq: Every day | ORAL | 0 refills | Status: DC

## 2016-07-02 MED ORDER — ALPRAZOLAM 0.25 MG PO TABS: 0.2500 mg | ORAL_TABLET | Freq: Every day | ORAL | 0 refills | Status: DC

## 2016-07-02 NOTE — Progress Notes (Signed)
Subjective:    Patient ID: Melissa Allen, female    DOB: [birth year], 50 y.o.   MRN: [id number]  HPI This is a 50 yo female who presents today with sore throat, right sided ear pain, cough x 4 days. Started one week ago with dry cough. Chest is tight and she has been wheezing for the last 2 days- has not been using albuterol inhaler or duoneb nebulizer treatment but has at home. Took a sudafed for ear congestion with some relief. Took some bendadryl for itching around nose and mouth yesterday. Eyes itchy and watery. Lost voice yesterday. No fever. Taking ibuprofen 800 mg several times a day for last 2 days without relief. No known recent sick contacts. Has known deviated septum with frequent symptoms on right side. Has chronic back pain which interferes with sleep.    Past Medical History:  Diagnosis Date  . Allergy   . Anginal pain ([place])    one episode 4-5 years ago-[facility] [place]-nonspecific-no problems since-  . Anxiety   . Arthritis    multiple areas of arthritis- DDD  . Asthma    recent admit to ER 01/11/12 for exac of asthma  . Calcaneal fracture   . Duplicated ureter, right   . Fibromyalgia   . Hypertension   . Kidney stones   . Obesity (BMI 30.0-34.9)   . Pancreatitis 1986  . PONV (postoperative nausea and vomiting)   . Poor venous access    hard to start iv-  . 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 Shortness of breath dyspnea   . Wears glasses    Past Surgical History:  Procedure Laterality Date  . ABDOMINAL HYSTERECTOMY    . BLADDER SUSPENSION  03/01/2012   Procedure: TRANSVAGINAL TAPE (TVT) PROCEDURE;  Surgeon: Emily Filbert, MD;  Location: [facility];  Service: Gynecology;  Laterality: N/A;  . BUNIONECTOMY  1995   Right  . CHOLECYSTECTOMY  late 1980's      . CYSTOSCOPY  03/01/2012   Procedure: CYSTOSCOPY;  Surgeon: Emily Filbert, MD;  Location: [facility];  Service: Gynecology;  Laterality: N/A;  .  CYSTOSCOPY W/ URETERAL STENT PLACEMENT Left 12/15/2014   Procedure: CYSTOSCOPY, LEFT URETEROSCOPYU WITH STONE EXTRACTION;  Surgeon: Irine Seal, MD;  Location: [facility];  Service: Urology;  Laterality: Left;  . INCISION AND DRAINAGE ABSCESS Left 07/19/2013   Procedure: INCISION AND DRAINAGE LEFT LOWER EXTERMITY HEMATOMA;  Surgeon: Imogene Burn. Georgette Dover, MD;  Location: [facility];  Service: General;  Laterality: Left;  Excision left lower leg mass  . KNEE SURGERY Left    x 2, Murphy/Sue, corrected Patella displacement   . LAMINECTOMY    . LUMBAR LAMINECTOMY  10/06   L4-5, Ray  . R compressed pronator Right    Sypher, R arm  . SPINAL FUSION    . TENDON REPAIR  2013  . TENDON REPAIR Right 02/01/12  . TONSILLECTOMY  age 52   Family History  Problem Relation Age of Onset  . Heart disease Father 34  . Colon polyps Father   . Colon cancer Paternal Grandfather   . Colon cancer Maternal Grandmother   . Uterine cancer      Grandmother  . Breast cancer      Grandmother  . Ovarian cancer      Grandmother  . Diabetes      mother  . Other Neg Hx   .  Cancer Mother     pancreatic cancer   Social History  Substance Use Topics  . Smoking status: Never Smoker  . Smokeless tobacco: Never Used  . Alcohol use 3.5 oz/week    7 Standard drinks or equivalent per week     Comment: Occasional- wine or liquor       Review of Systems Per HPI    Objective:   Physical Exam  Constitutional: She is oriented to person, place, and time. She appears well-developed and well-nourished. No distress.  Voice is hoarse and she sounds congested. She is obese and is pulling a crate full of glass jars.   HENT:  Head: Normocephalic and atraumatic.  Right Ear: External ear and ear canal normal. Tympanic membrane is retracted (and dull).  Left Ear: Tympanic membrane, external ear and ear canal normal.  Nose: Mucosal edema and septal deviation present. Right sinus exhibits no maxillary sinus tenderness  and no frontal sinus tenderness. Left sinus exhibits no maxillary sinus tenderness and no frontal sinus tenderness.  Mouth/Throat: Uvula is midline and mucous membranes are normal. Posterior oropharyngeal erythema present. No oropharyngeal exudate or posterior oropharyngeal edema.  Neck: Normal range of motion. Neck supple.  Cardiovascular: Normal rate, regular rhythm and normal heart sounds.   Normal rate on auscultation. ? Elevated rate secondary to walking and pulling heavy crate.   Pulmonary/Chest: Effort normal and breath sounds normal.  Lymphadenopathy:    She has no cervical adenopathy.  Neurological: She is alert and oriented to person, place, and time.  Skin: Skin is warm and dry. She is not diaphoretic.  Psychiatric: She has a normal mood and affect. Her behavior is normal. Judgment and thought content normal.  Vitals reviewed.     BP 110/80   Pulse (!) 120   Temp 99 F (37.2 C)   Wt 200 lb 6.4 oz (90.9 kg)   LMP 12/07/2011   SpO2 93%   BMI 34.40 kg/m  Wt Readings from Last 3 Encounters:  07/02/16 200 lb 6.4 oz (90.9 kg)  01/21/16 197 lb 9.6 oz (89.6 kg)  01/08/16 197 lb 8 oz (89.6 kg)       Assessment & Plan:  1. Cough - encouraged her to use her albuterol or duo neb nebulizer several times a day  2. Nasal congestion - viral illness vs allergic rhinitis - continue flonase, saline spray, add cetirizine, Afrin x 3 days  3. Mild intermittent asthma with acute exacerbation - see #1, also will add prednisone - predniSONE (DELTASONE) 20 MG tablet; Take 1 tablet (20 mg total) by mouth daily with breakfast.  Dispense: 5 tablet; Refill: 0 - let me know if not better in 48 hours  Clarene Reamer, FNP-BC  [facility], [place]  07/02/2016 2:14 PM

## 2016-07-02 NOTE — Telephone Encounter (Signed)
Which rx? 

## 2016-07-02 NOTE — Telephone Encounter (Signed)
Last f/u 06/2015

## 2016-07-02 NOTE — Progress Notes (Signed)
Pre visit review using our clinic review tool, if applicable. No additional management support is needed unless otherwise documented below in the visit note. 

## 2016-07-02 NOTE — Patient Instructions (Addendum)
Add daily Zyrtec (generic is fine) to dry secretions Use rescue inhaler/ nebulizer every 4- 8 hours as needed for chest tightness Continue Sudafed, benadryl, ibuprofen/acetaminophen Can also use Afrin nasal spray twice a day for 3 days Please be in touch if you are not better in 2-3 days

## 2016-07-02 NOTE — Telephone Encounter (Signed)
Rx called in to requested pharmacy 

## 2016-07-04 ENCOUNTER — Telehealth: Payer: Self-pay | Admitting: Family Medicine

## 2016-07-04 ENCOUNTER — Other Ambulatory Visit: Payer: Self-pay | Admitting: Family Medicine

## 2016-07-04 MED ORDER — ZOLPIDEM TARTRATE 10 MG PO TABS: 10.0000 mg | ORAL_TABLET | Freq: Every day | ORAL | 0 refills | Status: DC

## 2016-07-04 MED ORDER — AZITHROMYCIN 250 MG PO TABS
ORAL_TABLET | ORAL | 0 refills | Status: DC
Start: 2016-07-04 — End: 2016-08-05

## 2016-07-04 NOTE — Telephone Encounter (Signed)
Patient was seen on Wednesday.  Patient hasn't slept in 2 nights.  Patient has started wheezing.  Patient said she's coughing a lot and it hurts.  She tried Delsym.  Patient wants to know if Mucinex would help break it up.  Patient's eyes are watery,pressure behind eye and face,teeth pain. Drainage is green.Patient still has congestion. Patient has tried everything she was told to do, but it's not helping.  When she uses her Nebulizer, it gives her a headache. Patient uses [facility]. Patient is worried about her asthma. Please call patient.

## 2016-07-04 NOTE — Telephone Encounter (Signed)
Opened in error

## 2016-07-04 NOTE — Telephone Encounter (Signed)
Patient feels like her cough is getting worse, sputum is thick and green, nasal congestion worse, ear pain is worse. Wheezing more and not tolerating inhalers. Discussed treatment options, will add antibiotic. Prescription to [facility] per patient request. Can also try Mucinex DM and different antihistamine.

## 2016-07-04 NOTE — Addendum Note (Signed)
Addended by: Modena Nunnery on: 07/04/2016 09:44 AM   Modules accepted: Orders

## 2016-07-14 NOTE — Telephone Encounter (Signed)
Pt left /vm; pt seen 07/02/16; pt has finished prednisone and abx. Pt still has head congestion and pressure; feels tight in chest with wheezing. Pt thinks needs more medication and request [facility].

## 2016-07-14 NOTE — Telephone Encounter (Signed)
I did not see her for this but she was treated very appropriately.  Will route to Tor Netters who saw her for this.

## 2016-07-15 NOTE — Telephone Encounter (Signed)
Called patient's home and cell number. Left message and my number if she needs to call back.

## 2016-07-23 ENCOUNTER — Other Ambulatory Visit: Payer: Self-pay | Admitting: Family Medicine

## 2016-07-28 ENCOUNTER — Other Ambulatory Visit: Payer: Self-pay | Admitting: Family Medicine

## 2016-08-05 ENCOUNTER — Ambulatory Visit (INDEPENDENT_AMBULATORY_CARE_PROVIDER_SITE_OTHER)
Admission: RE | Admit: 2016-08-05 | Discharge: 2016-08-05 | Disposition: A | Discharge status: Home / Self Care | Payer: BC Managed Care – PPO | Source: Ambulatory Visit | Attending: Family Medicine | Admitting: Family Medicine

## 2016-08-05 ENCOUNTER — Ambulatory Visit (INDEPENDENT_AMBULATORY_CARE_PROVIDER_SITE_OTHER): Payer: BC Managed Care – PPO | Admitting: Family Medicine

## 2016-08-05 ENCOUNTER — Other Ambulatory Visit: Payer: Self-pay | Admitting: *Deleted

## 2016-08-05 ENCOUNTER — Encounter: Payer: Self-pay | Admitting: Family Medicine

## 2016-08-05 VITALS — BP 110/70 | HR 106 | Temp 98.7°F | Wt 198.8 lb

## 2016-08-05 DIAGNOSIS — R202 Paresthesia of skin: Secondary | ICD-10-CM | POA: Diagnosis not present

## 2016-08-05 MED ORDER — PREDNISONE 10 MG PO TABS: 10.0000 mg | ORAL_TABLET | Freq: Every day | ORAL | 0 refills | Status: DC

## 2016-08-05 MED ORDER — ZOLPIDEM TARTRATE 10 MG PO TABS: 10.0000 mg | ORAL_TABLET | Freq: Every day | ORAL | 0 refills | Status: DC

## 2016-08-05 NOTE — Progress Notes (Signed)
Pre visit review using our clinic review tool, if applicable. No additional management support is needed unless otherwise documented below in the visit note. 

## 2016-08-05 NOTE — Progress Notes (Signed)
Prev fusion from neurosurgery.  H/o chronic sciatica.  Seeing pain clinic and is considering spinal cord stimulator.  She fell on 07/31/16.   She missed a step and fell on her buttocks, bruised her R buttock area and "it jarred everything."    Now with B hand numbness increased since the fall but not consistent unilateral arm/hand weakness.  Now with some worsening numbness in the B lower back and upper legs posteriorly.  She has h/o L leg weakness at baseline, that doesn't appear to be different since the fall.   Taking 800mg  ibuprofen BID in the meantime.  No head trauma with the fall.    Meds, vitals, and allergies reviewed.   ROS: Per HPI unless specifically indicated in ROS section   GEN: nad, alert and oriented HEENT: mucous membranes moist NECK: supple w/o LA CV: rrr. PULM: ctab, no inc wob ABD: soft, +bs EXT: no edema SKIN: no acute rash CN 2-12 grossly wnl.  Strength within normal limits for the upper extremities bilaterally. She has normal temperature and vibration sensation on the arms and hands. She has slight decreased sensation to monofilament on the right arm and hand. Able to bear weight. Back not tender in midline. She is tender on the posterior proximal portion of the right thigh, but not over bony prominences.

## 2016-08-05 NOTE — Patient Instructions (Signed)
Stop ibuprofen.  Change to prednisone with food.  Go to the lab on the way out.  We'll contact you with your xray report. We'll go from there.   Take care.  Glad to see you.

## 2016-08-05 NOTE — Telephone Encounter (Signed)
Pt has not had a f/u since 06/2015

## 2016-08-05 NOTE — Telephone Encounter (Signed)
Rx called in to requested pharmacy 

## 2016-08-06 ENCOUNTER — Other Ambulatory Visit: Payer: Self-pay | Admitting: *Deleted

## 2016-08-06 DIAGNOSIS — R202 Paresthesia of skin: Secondary | ICD-10-CM | POA: Insufficient documentation

## 2016-08-06 MED ORDER — ALPRAZOLAM 0.25 MG PO TABS: 0.2500 mg | ORAL_TABLET | Freq: Every day | ORAL | 0 refills | Status: DC

## 2016-08-06 NOTE — Telephone Encounter (Signed)
Ok to phone in Xanax 

## 2016-08-06 NOTE — Telephone Encounter (Signed)
Rx called in to requested pharmacy 

## 2016-08-06 NOTE — Telephone Encounter (Signed)
Last f/u 06/2015

## 2016-08-06 NOTE — Assessment & Plan Note (Signed)
This seems to be worse since the fall, but she does not have any emergent symptoms at this point. Reasonable to check plain films of her neck. Stop ibuprofen. Change to short burst of prednisone. She should be able to tolerate 10 mg a day. Routine cautions on steroids given. See notes on imaging. Okay for outpatient follow-up. Routed to PCP as FYI. >25 minutes spent in face to face time with patient, >50% spent in counselling or coordination of care.

## 2016-08-27 ENCOUNTER — Encounter: Payer: Self-pay | Admitting: Internal Medicine

## 2016-08-27 ENCOUNTER — Ambulatory Visit (INDEPENDENT_AMBULATORY_CARE_PROVIDER_SITE_OTHER): Payer: BC Managed Care – PPO | Admitting: Internal Medicine

## 2016-08-27 VITALS — BP 138/78 | HR 100 | Temp 99.1°F | Resp 20 | Wt 203.0 lb

## 2016-08-27 DIAGNOSIS — I10 Essential (primary) hypertension: Secondary | ICD-10-CM

## 2016-08-27 DIAGNOSIS — R05 Cough: Secondary | ICD-10-CM

## 2016-08-27 DIAGNOSIS — R059 Cough, unspecified: Secondary | ICD-10-CM

## 2016-08-27 DIAGNOSIS — R062 Wheezing: Secondary | ICD-10-CM

## 2016-08-27 MED ORDER — LEVOFLOXACIN 500 MG PO TABS: 500.0000 mg | ORAL_TABLET | Freq: Every day | ORAL | 0 refills | Status: AC

## 2016-08-27 MED ORDER — HYDROCODONE-HOMATROPINE 5-1.5 MG/5ML PO SYRP: 5.0000 mL | ORAL_SOLUTION | Freq: Four times a day (QID) | ORAL | 0 refills | Status: AC | PRN

## 2016-08-27 MED ORDER — METHYLPREDNISOLONE ACETATE 80 MG/ML IJ SUSP
80.0000 mg | Freq: Once | INTRAMUSCULAR | Status: AC
Start: 2016-08-27 — End: 2016-08-27
  Administered 2016-08-27: 80 mg via INTRAMUSCULAR

## 2016-08-27 MED ORDER — ALBUTEROL SULFATE HFA 108 (90 BASE) MCG/ACT IN AERS: INHALATION_SPRAY | RESPIRATORY_TRACT | 2 refills | Status: AC

## 2016-08-27 MED ORDER — MOMETASONE FURO-FORMOTEROL FUM 200-5 MCG/ACT IN AERO
2.0000 | INHALATION_SPRAY | Freq: Two times a day (BID) | RESPIRATORY_TRACT | 3 refills | Status: DC
Start: 2016-08-27 — End: 2019-07-31

## 2016-08-27 MED ORDER — PREDNISONE 10 MG PO TABS: ORAL_TABLET | ORAL | 0 refills | Status: DC

## 2016-08-27 NOTE — Progress Notes (Signed)
Pre visit review using our clinic review tool, if applicable. No additional management support is needed unless otherwise documented below in the visit note. 

## 2016-08-27 NOTE — Patient Instructions (Signed)
You had the steroid shot today  Please take all new medication as prescribed - the antibiotic, prednisone, and cough medicine if needed (the cough medicine should be ok as hydrocodone in the cough syrup is not the same as codeine on you allergy list)  Please call if you change your mind about the chest xray  Please continue all other medications as before, and refills have been done if requested.  Please have the pharmacy call with any other refills you may need.  Please keep your appointments with your specialists as you may have planned

## 2016-08-27 NOTE — Progress Notes (Signed)
Subjective:    Patient ID: Melissa Allen, female    DOB: 1966/02/16, 50 y.o.   MRN: [id number]  HPI  Here with acute onset mild to mod 2-3 days ST, HA, general weakness and malaise, with prod cough greenish sputum, but Pt denies chest pain, increased sob or doe, wheezing, orthopnea, PND, increased LE swelling, palpitations, dizziness or syncope, except for onset mild wheezing and sob since last PM with increased inhaler use. Pt denies new neurological symptoms such as new headache, or facial or extremity weakness or numbness   Pt denies polydipsia, polyuria Past Medical History:  Diagnosis Date  . Allergy   . Anginal pain ([place])    one episode 4-5 years ago-[facility] [place]-nonspecific-no problems since-  . Anxiety   . Arthritis    multiple areas of arthritis- DDD  . Asthma    recent admit to ER 01/11/12 for exac of asthma  . Calcaneal fracture   . Duplicated ureter, right   . Fibromyalgia   . Hypertension   . Kidney stones   . Obesity (BMI 30.0-34.9)   . Pancreatitis 1986  . PONV (postoperative nausea and vomiting)   . Poor venous access    hard to start iv-  . 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 Shortness of breath dyspnea   . Wears glasses    Past Surgical History:  Procedure Laterality Date  . ABDOMINAL HYSTERECTOMY    . BLADDER SUSPENSION  03/01/2012   Procedure: TRANSVAGINAL TAPE (TVT) PROCEDURE;  Surgeon: Emily Filbert, MD;  Location: [facility];  Service: Gynecology;  Laterality: N/A;  . BUNIONECTOMY  1995   Right  . CHOLECYSTECTOMY  late 1980's      . CYSTOSCOPY  03/01/2012   Procedure: CYSTOSCOPY;  Surgeon: Emily Filbert, MD;  Location: [facility];  Service: Gynecology;  Laterality: N/A;  . CYSTOSCOPY W/ URETERAL STENT PLACEMENT Left 12/15/2014   Procedure: CYSTOSCOPY, LEFT URETEROSCOPYU WITH STONE EXTRACTION;  Surgeon: Irine Seal, MD;  Location[facility];  Service: Urology;  Laterality: Left;  . INCISION  AND DRAINAGE ABSCESS Left 07/19/2013   Procedure: INCISION AND DRAINAGE LEFT LOWER EXTERMITY HEMATOMA;  Surgeon: Imogene Burn. Georgette Dover, MD;  Location: [facility];  Service: General;  Laterality: Left;  Excision left lower leg mass  . KNEE SURGERY Left    x 2, Murphy/Sue, corrected Patella displacement   . LAMINECTOMY    . LUMBAR LAMINECTOMY  10/06   L4-5, Ray  . R compressed pronator Right    Sypher, R arm  . SPINAL FUSION    . TENDON REPAIR  2013  . TENDON REPAIR Right 02/01/12  . TONSILLECTOMY  age 54    reports that she has never smoked. She has never used smokeless tobacco. She reports that she drinks about 3.5 oz of alcohol per week . She reports that she does not use drugs. family history includes Cancer in her mother; Colon cancer in her maternal grandmother and paternal grandfather; Colon polyps in her father; Heart disease (age of onset: 31) in her father. Allergies  Allergen Reactions  . Codeine Nausea And Vomiting  . Lisinopril Cough       . Hydrocodone Itching    Causes minor itching/ pt is currently taking (04/27/15)  . Tape Rash    Irritation to inside of thigh, /w PINK tape, can tolerate paper tape    Current Outpatient Prescriptions on  File Prior to Visit  Medication Sig Dispense Refill  . ALPRAZolam (XANAX) 0.25 MG tablet Take 1-2 tablets (0.25-0.5 mg total) by mouth daily. 60 tablet 0  . cholecalciferol (VITAMIN D) 1000 UNITS tablet Take 1,000 Units by mouth daily.    . cyclobenzaprine (FLEXERIL) 10 MG tablet Take 10 mg by mouth as needed for muscle spasms (muscle spasms). Take 1 tablet (10 mg) daily at bedtime, may take an additional tablet during the day as needed for muscle spasms    . diphenhydrAMINE (BENADRYL) 25 MG tablet Take 25 mg by mouth every 6 (six) hours as needed for itching.    . DULoxetine (CYMBALTA) 60 MG capsule TAKE ONE CAPSULE EACH NIGHT AT BEDTIME *NEEDS VISIT FOR FURTHER REFILLS 90 capsule 3  . fluticasone (FLONASE) 50 MCG/ACT nasal  spray USE 2 SPRAYS INTO EACH NOSTRIL ONCE DAILY AS DIRECTED. 16 g 5  . HYDROcodone-acetaminophen (NORCO) 10-325 MG per tablet Take 2 tablets by mouth every 4 (four) hours as needed for moderate pain or severe pain.     Marland Kitchen ipratropium-albuterol (DUONEB) 0.5-2.5 (3) MG/3ML SOLN Take 3 mLs by nebulization every 4 (four) hours as needed. (Patient taking differently: Take 3 mLs by nebulization every 4 (four) hours as needed (shortness of breath/wheezing). ) 360 mL 1  . losartan (COZAAR) 50 MG tablet TAKE 1 TABLET BY MOUTH DAILY 90 tablet 1  . Magnesium 250 MG TABS Take 250 mg by mouth daily.    Marland Kitchen PREMARIN 0.3 MG tablet TAKE 1 TABLET BY MOUTH DAILY 90 tablet 0  . triamterene-hydrochlorothiazide (MAXZIDE-25) 37.5-25 MG tablet TAKE 1 TABLET BY MOUTH DAILY 90 tablet 1  . Turmeric 500 MG CAPS Take 500 mg by mouth daily.    . vitamin B-12 (CYANOCOBALAMIN) 1000 MCG tablet Take 1,000 mcg by mouth daily.    Marland Kitchen zolpidem (AMBIEN) 10 MG tablet Take 1 tablet (10 mg total) by mouth at bedtime. 30 tablet 0   No current facility-administered medications on file prior to visit.    Review of Systems  Constitutional: Negative for unusual diaphoresis or night sweats HENT: Negative for ear swelling or discharge Eyes: Negative for worsening visual haziness  Respiratory: Negative for choking and stridor.   Gastrointestinal: Negative for distension or worsening eructation Genitourinary: Negative for retention or change in urine volume.  Musculoskeletal: Negative for other MSK pain or swelling Skin: Negative for color change and worsening wound Neurological: Negative for tremors and numbness other than noted  Psychiatric/Behavioral: Negative for decreased concentration or agitation other than above       Objective:   Physical Exam BP 138/78   Pulse 100   Temp 99.1 F (37.3 C) (Oral)   Resp 20   Wt 203 lb (92.1 kg)   LMP 12/07/2011   SpO2 98%   BMI 34.84 kg/m  VS noted, mild ill Constitutional: Pt appears in  no apparent distress HENT: Head: NCAT.  Right Ear: External ear normal.  Left Ear: External ear normal.  Eyes: . Pupils are equal, round, and reactive to light. Conjunctivae and EOM are normal Bilat tm's with mild erythema.  Max sinus areas non tender.  Pharynx with mild erythema, no exudate Neck: Normal range of motion. Neck supple. + bilat submandib LA tender mobile Cardiovascular: Normal rate and regular rhythm.   Pulmonary/Chest: Effort normal and breath sounds without rales but with few bilat scattered wheezing.  Neurological: Pt is alert. Not confused , motor grossly intact Skin: Skin is warm. No rash, no LE edema Psychiatric: Pt  behavior is normal. No agitation.        Assessment & Plan:

## 2016-08-31 DIAGNOSIS — R062 Wheezing: Secondary | ICD-10-CM | POA: Insufficient documentation

## 2016-08-31 NOTE — Assessment & Plan Note (Signed)
stable overall by history and exam, recent data reviewed with pt, and pt to continue medical treatment as before,  to f/u any worsening symptoms or concerns BP Readings from Last 3 Encounters:  08/27/16 138/78  08/05/16 110/70  07/02/16 110/80

## 2016-08-31 NOTE — Assessment & Plan Note (Signed)
Mild to mod, for depomedrol 80 IM, predpac asd,  to f/u any worsening symptoms or concerns 

## 2016-08-31 NOTE — Assessment & Plan Note (Signed)
consistent with bronchitis vs pna, declines cxr, Mild to mod, for antibx course,  to f/u any worsening symptoms or concerns

## 2016-09-01 ENCOUNTER — Other Ambulatory Visit: Payer: Self-pay | Admitting: Family Medicine

## 2016-09-02 NOTE — Telephone Encounter (Signed)
Last filled 08-05-16 #30 Last OV Acute 08-05-16 No Future OV

## 2016-09-02 NOTE — Telephone Encounter (Signed)
Left refill on voice mail at pharmacy  

## 2016-09-04 ENCOUNTER — Other Ambulatory Visit: Payer: Self-pay | Admitting: Family Medicine

## 2016-09-05 NOTE — Telephone Encounter (Signed)
Rx called in to requested pharmacy 

## 2016-09-05 NOTE — Telephone Encounter (Signed)
Last f/u 06/2015

## 2016-09-24 ENCOUNTER — Other Ambulatory Visit: Payer: Self-pay | Admitting: Family Medicine

## 2016-09-30 ENCOUNTER — Other Ambulatory Visit: Payer: Self-pay | Admitting: Urology

## 2016-10-01 ENCOUNTER — Observation Stay (HOSPITAL_COMMUNITY)
Admission: EM | Admit: 2016-10-01 | Discharge: 2016-10-02 | Disposition: A | Discharge status: Home / Self Care | Payer: BC Managed Care – PPO | Attending: Urology | Admitting: Urology

## 2016-10-01 ENCOUNTER — Encounter (HOSPITAL_COMMUNITY): Payer: Self-pay | Admitting: Emergency Medicine

## 2016-10-01 ENCOUNTER — Emergency Department (HOSPITAL_COMMUNITY): Payer: BC Managed Care – PPO

## 2016-10-01 DIAGNOSIS — Z87442 Personal history of urinary calculi: Secondary | ICD-10-CM | POA: Insufficient documentation

## 2016-10-01 DIAGNOSIS — N201 Calculus of ureter: Secondary | ICD-10-CM | POA: Diagnosis present

## 2016-10-01 DIAGNOSIS — Z6835 Body mass index (BMI) 35.0-35.9, adult: Secondary | ICD-10-CM | POA: Diagnosis not present

## 2016-10-01 DIAGNOSIS — F411 Generalized anxiety disorder: Secondary | ICD-10-CM | POA: Insufficient documentation

## 2016-10-01 DIAGNOSIS — E669 Obesity, unspecified: Secondary | ICD-10-CM | POA: Insufficient documentation

## 2016-10-01 DIAGNOSIS — I10 Essential (primary) hypertension: Secondary | ICD-10-CM | POA: Diagnosis not present

## 2016-10-01 DIAGNOSIS — M797 Fibromyalgia: Secondary | ICD-10-CM | POA: Insufficient documentation

## 2016-10-01 DIAGNOSIS — N132 Hydronephrosis with renal and ureteral calculous obstruction: Principal | ICD-10-CM | POA: Insufficient documentation

## 2016-10-01 DIAGNOSIS — J45909 Unspecified asthma, uncomplicated: Secondary | ICD-10-CM | POA: Diagnosis not present

## 2016-10-01 DIAGNOSIS — Z79899 Other long term (current) drug therapy: Secondary | ICD-10-CM | POA: Insufficient documentation

## 2016-10-01 DIAGNOSIS — N200 Calculus of kidney: Secondary | ICD-10-CM | POA: Diagnosis present

## 2016-10-01 LAB — CBC WITH DIFFERENTIAL/PLATELET
BASOS PCT: 1 %
Basophils Absolute: 0.1 10*3/uL (ref 0.0–0.1)
EOS ABS: 0.1 10*3/uL (ref 0.0–0.7)
EOS PCT: 1 %
HCT: 39.8 % (ref 36.0–46.0)
HEMOGLOBIN: 13.5 g/dL (ref 12.0–15.0)
LYMPHS ABS: 2.3 10*3/uL (ref 0.7–4.0)
Lymphocytes Relative: 22 %
MCH: 29.5 pg (ref 26.0–34.0)
MCHC: 33.9 g/dL (ref 30.0–36.0)
MCV: 86.9 fL (ref 78.0–100.0)
MONOS PCT: 6 %
Monocytes Absolute: 0.6 10*3/uL (ref 0.1–1.0)
NEUTROS PCT: 70 %
Neutro Abs: 7.3 10*3/uL (ref 1.7–7.7)
PLATELETS: 267 10*3/uL (ref 150–400)
RBC: 4.58 MIL/uL (ref 3.87–5.11)
RDW: 13.3 % (ref 11.5–15.5)
WBC: 10.4 10*3/uL (ref 4.0–10.5)

## 2016-10-01 LAB — URINALYSIS, ROUTINE W REFLEX MICROSCOPIC
BILIRUBIN URINE: NEGATIVE
Glucose, UA: NEGATIVE mg/dL
KETONES UR: NEGATIVE mg/dL
NITRITE: NEGATIVE
Protein, ur: NEGATIVE mg/dL
Specific Gravity, Urine: 1.005 (ref 1.005–1.030)
pH: 7 (ref 5.0–8.0)

## 2016-10-01 LAB — I-STAT CHEM 8, ED
BUN: 12 mg/dL (ref 6–20)
CALCIUM ION: 1.11 mmol/L — AB (ref 1.15–1.40)
Chloride: 102 mmol/L (ref 101–111)
Creatinine, Ser: 0.7 mg/dL (ref 0.44–1.00)
Glucose, Bld: 99 mg/dL (ref 65–99)
HEMATOCRIT: 40 % (ref 36.0–46.0)
Hemoglobin: 13.6 g/dL (ref 12.0–15.0)
Potassium: 3.2 mmol/L — ABNORMAL LOW (ref 3.5–5.1)
SODIUM: 139 mmol/L (ref 135–145)
TCO2: 25 mmol/L (ref 0–100)

## 2016-10-01 MED ORDER — DIPHENHYDRAMINE HCL 50 MG/ML IJ SOLN
12.5000 mg | Freq: Once | INTRAMUSCULAR | Status: AC
  Administered 2016-10-01: 12.5 mg via INTRAVENOUS
  Filled 2016-10-01: qty 1

## 2016-10-01 MED ORDER — HYDROMORPHONE HCL 1 MG/ML IJ SOLN
1.0000 mg | Freq: Once | INTRAMUSCULAR | Status: AC
  Administered 2016-10-01: 1 mg via INTRAVENOUS
  Filled 2016-10-01: qty 1

## 2016-10-01 MED ORDER — SODIUM CHLORIDE 0.9 % IV BOLUS (SEPSIS)
500.0000 mL | Freq: Once | INTRAVENOUS | Status: AC
Start: 2016-10-01 — End: 2016-10-01
  Administered 2016-10-01: 500 mL via INTRAVENOUS

## 2016-10-01 MED ORDER — ONDANSETRON HCL 4 MG/2ML IJ SOLN
4.0000 mg | Freq: Once | INTRAMUSCULAR | Status: AC
  Administered 2016-10-01: 4 mg via INTRAVENOUS
  Filled 2016-10-01: qty 2

## 2016-10-01 MED ORDER — ONDANSETRON 4 MG PO TBDP
4.0000 mg | ORAL_TABLET | Freq: Once | ORAL | Status: AC | PRN
  Administered 2016-10-01: 4 mg via ORAL
  Filled 2016-10-01: qty 1

## 2016-10-01 MED ORDER — OXYCODONE-ACETAMINOPHEN 5-325 MG PO TABS
1.0000 | ORAL_TABLET | ORAL | Status: DC | PRN
  Administered 2016-10-01: 1 via ORAL
  Filled 2016-10-01 (×2): qty 1

## 2016-10-01 NOTE — ED Provider Notes (Signed)
[facility] DEPT Provider Note   CSN: [id number] Arrival date & time: 10/01/16  1807     History   Chief Complaint Chief Complaint  Patient presents with  . Nephrolithiasis    HPI Melissa Allen is a 51 y.o. female.  HPI   patient resents with pain from her kidney stone. Seen at [facility] on Friday and then again on Monday. Had ultrasound x-ray and then a CT scan. Has 2 distal stones in the left side. Pain is been uncontrolled with her Percocet at home. States she has called them and never seems to pass the stone on her own. States she's had stents and retrieval several times in the past. She is scheduled on Tuesday to have this done, however today is Wednesday doesn't think she can make it to him. No relief with her Toradol and Percocet at home. No fevers. States she feels bad. States had some dysuria.   Past Medical History:  Diagnosis Date  . Allergy   . Anginal pain ([place])    one episode 4-5 years ago-[facility]-nonspecific-no problems since-  . Anxiety   . Arthritis    multiple areas of arthritis- DDD  . Asthma    recent admit to ER 01/11/12 for exac of asthma  . Calcaneal fracture   . Duplicated ureter, right   . Fibromyalgia   . Hypertension   . Kidney stones   . Obesity (BMI 30.0-34.9)   . Pancreatitis 1986  . PONV (postoperative nausea and vomiting)   . Poor venous access    hard to start iv-  . 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 Shortness of breath dyspnea   . Wears glasses     Patient Active Problem List   Diagnosis Date Noted  . Wheezing 08/31/2016  . Arm paresthesia, right 08/06/2016  . Neck pain 01/21/2016  . Hot flashes 01/08/2016  . Chest discomfort 12/31/2015  . Spinal stenosis, lumbar region, 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  . Degenerative spondylolisthesis 05/01/2015  . Insomnia 09/30/2013  . Cough 11/14/2010  . HEADACHE 04/26/2010  .  FIBROMYALGIA 01/25/2010  . Anxiety state 01/22/2010  . DEPRESSION 01/22/2010  . REFLUX GASTRITIS 01/22/2010  . HEPATIC CYST 01/22/2010  . NEPHROLITHIASIS, HX OF 01/22/2010  . Essential hypertension 01/01/2009  . Palpitations 01/11/2008  . Asthma 11/26/2007  . GERD 11/26/2007  . PANCREATITIS, CHRONIC 11/26/2007  . RENAL CALCULUS 11/26/2007  . ARTHRITIS 11/26/2007  . Allergic rhinitis 11/24/2007    Past Surgical History:  Procedure Laterality Date  . ABDOMINAL HYSTERECTOMY    . BLADDER SUSPENSION  03/01/2012   Procedure: TRANSVAGINAL TAPE (TVT) PROCEDURE;  Surgeon: Emily Filbert, MD;  Location: [facility];  Service: Gynecology;  Laterality: N/A;  . BUNIONECTOMY  1995   Right  . CHOLECYSTECTOMY  late 1980's      . CYSTOSCOPY  03/01/2012   Procedure: CYSTOSCOPY;  Surgeon: Emily Filbert, MD;  Location: [facility];  Service: Gynecology;  Laterality: N/A;  . CYSTOSCOPY W/ URETERAL STENT PLACEMENT Left 12/15/2014   Procedure: CYSTOSCOPY, LEFT URETEROSCOPYU WITH STONE EXTRACTION;  Surgeon: Irine Seal, MD;  Location: [facility];  Service: Urology;  Laterality: Left;  . INCISION AND DRAINAGE ABSCESS Left 07/19/2013   Procedure: INCISION AND DRAINAGE LEFT LOWER EXTERMITY HEMATOMA;  Surgeon: Imogene Burn. Georgette Dover, MD;  Location: [facility];  Service: General;  Laterality: Left;  Excision left lower leg mass  .  KNEE SURGERY Left    x 2, Murphy/Sue, corrected Patella displacement   . LAMINECTOMY    . LUMBAR LAMINECTOMY  10/06   L4-5, Ray  . R compressed pronator Right    Sypher, R arm  . SPINAL FUSION    . TENDON REPAIR  2013  . TENDON REPAIR Right 02/01/12  . TONSILLECTOMY  age 54    OB History    Gravida Para Term Preterm AB Living   3 3 2 1  0 3   SAB TAB Ectopic Multiple Live Births   0 0 0 0         Home Medications    Prior to Admission medications   Medication Sig Start Date End Date Taking? Authorizing Provider  albuterol (PROAIR HFA) 108 (90 Base) MCG/ACT inhaler INHALE TWO  PUFFS EVERY FOUR HOURS AS NEEDED SHORTNESS OF BREATH 08/27/16  Yes Biagio Borg, MD  ALPRAZolam Duanne Moron) 0.25 MG tablet TAKE 1-2 TABLETS BY MOUTH DAILY Patient taking differently: TAKE 1-2 TABLETS BY MOUTH DAILY AS NEEDED FOR ANXIETY 09/05/16  Yes Lucille Passy, MD  cyclobenzaprine (FLEXERIL) 10 MG tablet Take 10 mg by mouth as needed for muscle spasms (muscle spasms). Take 1 tablet (10 mg) daily at bedtime, may take an additional tablet during the day as needed for muscle spasms   Yes Historical Provider, MD  diphenhydrAMINE (BENADRYL) 25 MG tablet Take 25 mg by mouth every 6 (six) hours as needed for itching.   Yes Historical Provider, MD  DULoxetine (CYMBALTA) 60 MG capsule TAKE ONE CAPSULE EACH NIGHT AT BEDTIME *NEEDS VISIT FOR FURTHER REFILLS 06/16/16  Yes Lucille Passy, MD  fluticasone (FLONASE) 50 MCG/ACT nasal spray USE 2 SPRAYS INTO EACH NOSTRIL ONCE DAILY AS DIRECTED. 07/01/16  Yes Lucille Passy, MD  ipratropium-albuterol (DUONEB) 0.5-2.5 (3) MG/3ML SOLN Take 3 mLs by nebulization every 4 (four) hours as needed. Patient taking differently: Take 3 mLs by nebulization every 4 (four) hours as needed (shortness of breath/wheezing).  01/04/13  Yes Lucille Passy, MD  ketorolac (TORADOL) 10 MG tablet Take 10 mg by mouth 4 (four) times daily as needed for pain. 09/26/16  Yes Historical Provider, MD  losartan (COZAAR) 50 MG tablet TAKE 1 TABLET BY MOUTH DAILY 07/23/16  Yes Lucille Passy, MD  mometasone-formoterol [facility]) 200-5 MCG/ACT AERO Inhale 2 puffs into the lungs 2 (two) times daily. 08/27/16  Yes Biagio Borg, MD  oxyCODONE-acetaminophen (PERCOCET/ROXICET) 5-325 MG tablet Take 1 tablet by mouth every 4 (four) hours as needed for pain. 09/29/16  Yes Historical Provider, MD  PREMARIN 0.3 MG tablet TAKE 1 TABLET BY MOUTH DAILY 09/26/16  Yes Lucille Passy, MD  tamsulosin (FLOMAX) 0.4 MG CAPS capsule Take 0.4 mg by mouth daily.   Yes Historical Provider, MD  triamterene-hydrochlorothiazide (MAXZIDE-25)  37.5-25 MG tablet TAKE 1 TABLET BY MOUTH DAILY 07/28/16  Yes Lucille Passy, MD  URELLE (URELLE/URISED) 81 MG TABS tablet Take 1 tablet by mouth daily.   Yes Historical Provider, MD  zolpidem (AMBIEN) 10 MG tablet TAKE ONE TABLET BY MOUTH EVERY NIGHT AT BEDTIME 09/02/16  Yes Lucille Passy, MD  predniSONE (DELTASONE) 10 MG tablet 3 tabs by mouth per day for 3 days,2tabs per day for 3 days,1tab per day for 3 days Patient not taking: Reported on 10/01/2016 08/27/16   Biagio Borg, MD    Family History Family History  Problem Relation Age of Onset  . Cancer Mother     pancreatic  cancer  . Heart disease Father 14  . Colon polyps Father   . Colon cancer Paternal Grandfather   . Colon cancer Maternal Grandmother   . Uterine cancer      Grandmother  . Breast cancer      Grandmother  . Ovarian cancer      Grandmother  . Diabetes      mother  . Other Neg Hx     Social History Social History  Substance Use Topics  . Smoking status: Never Smoker  . Smokeless tobacco: Never Used  . Alcohol use 3.5 oz/week    7 Standard drinks or equivalent per week     Comment: Occasional- wine or liquor      Allergies   Codeine; Lisinopril; Hydrocodone; and Tape   Review of Systems Review of Systems  Constitutional: Negative for appetite change and fever.  HENT: Negative for congestion.   Respiratory: Negative for shortness of breath.   Cardiovascular: Negative for chest pain.  Gastrointestinal: Positive for nausea.  Endocrine: Negative for polyuria.  Genitourinary: Positive for dysuria and flank pain.  Musculoskeletal: Positive for back pain. Negative for gait problem.  Skin: Negative for wound.  Neurological: Negative for weakness.  Hematological: Negative for adenopathy.  Psychiatric/Behavioral: Negative for confusion.     Physical Exam Updated Vital Signs BP 115/64   Pulse 106   Temp 98.7 F (37.1 C) (Rectal)   Resp 18   Ht 5\' 4"  (1.626 m)   Wt 195 lb (88.5 kg)   LMP 12/21/2011    SpO2 99%   BMI 33.47 kg/m   Physical Exam  Constitutional: She appears well-developed.  Patient appears uncomfortable  HENT:  Head: Normocephalic.  Eyes: Pupils are equal, round, and reactive to light.  Neck: Neck supple.  Cardiovascular: Normal rate.   Pulmonary/Chest: Effort normal.  Abdominal: Soft. There is no tenderness.  Genitourinary:  Genitourinary Comments: CVA tenderness on left.  Musculoskeletal: She exhibits no edema.  Neurological: She is alert.  Skin: Skin is warm. Capillary refill takes less than 2 seconds.     ED Treatments / Results  Labs (all labs ordered are listed, but only abnormal results are displayed) Labs Reviewed  URINALYSIS, ROUTINE W REFLEX MICROSCOPIC - Abnormal; Notable for the following:       Result Value   Color, Urine STRAW (*)    APPearance HAZY (*)    Hgb urine dipstick SMALL (*)    Leukocytes, UA MODERATE (*)    Bacteria, UA MANY (*)    Squamous Epithelial / LPF 6-30 (*)    All other components within normal limits  I-STAT CHEM 8, ED - Abnormal; Notable for the following:    Potassium 3.2 (*)    Calcium, Ion 1.11 (*)    All other components within normal limits  CBC WITH DIFFERENTIAL/PLATELET    EKG  EKG Interpretation None       Radiology Dg Abdomen 1 View  Result Date: 10/01/2016 CLINICAL DATA:  51 year old female with left flank pain. History of kidney stone. EXAM: ABDOMEN - 1 VIEW COMPARISON:  Abdominal CT dated 09/29/2016 FINDINGS: Evaluation is limited due to patient's body habitus. Moderate stool noted within the colon. There is no bowel dilatation or evidence of obstruction. No free air or radiopaque calculi identified. Right upper quadrant cholecystectomy clips noted. L4-L5 interbody fusion and fixation hardware noted. No acute osseous pathology. IMPRESSION: No acute findings. Electronically Signed   By: Anner Crete M.D.   On: 10/01/2016 21:59  Procedures Procedures (including critical care  time)  Medications Ordered in ED Medications  oxyCODONE-acetaminophen (PERCOCET/ROXICET) 5-325 MG per tablet 1 tablet (1 tablet Oral Given 10/01/16 1836)  ondansetron (ZOFRAN-ODT) disintegrating tablet 4 mg (4 mg Oral Given 10/01/16 1836)  sodium chloride 0.9 % bolus 500 mL (0 mLs Intravenous Stopped 10/01/16 2300)  HYDROmorphone (DILAUDID) injection 1 mg (1 mg Intravenous Given 10/01/16 2158)  ondansetron (ZOFRAN) injection 4 mg (4 mg Intravenous Given 10/01/16 2157)  HYDROmorphone (DILAUDID) injection 1 mg (1 mg Intravenous Given 10/01/16 2221)     Initial Impression / Assessment and Plan / ED Course  I have reviewed the triage vital signs and the nursing notes.  Pertinent labs & imaging results that were available during my care of the patient were reviewed by me and considered in my medical decision making (see chart for details).      Patient with left flank pain. Known ureteral stone. CT scan reviewed. Continued pain unrelieved by medicines at home. Urinalysis showed white cells and some bacteria. However no systemic white count and no fever. Pain unrelieved with Dilaudid. Will admit to nephrology. D/w Dr Diona Fanti  Final Clinical Impressions(s) / ED Diagnoses   Final diagnoses:  Left ureteral stone    New Prescriptions New Prescriptions   No medications on file     Davonna Belling, MD 10/01/16 2333

## 2016-10-01 NOTE — ED Notes (Signed)
Bed: WA21 Expected date:  Expected time:  Means of arrival:  Comments: Held for [place]

## 2016-10-01 NOTE — ED Notes (Signed)
ED Provider at bedside. 

## 2016-10-01 NOTE — ED Triage Notes (Signed)
Pt was seen outpatient about 10 days ago for a renal stone.  It was confirmed on CT.  Pt comes today with severe pain around her bladder.  She states she has had it move and become obstructed before.  Pt states she has had no issues with urination with no obvious blood in urine.  Pt has been taking toradol and percocet without relief.

## 2016-10-02 ENCOUNTER — Observation Stay (HOSPITAL_COMMUNITY): Payer: BC Managed Care – PPO | Admitting: Anesthesiology

## 2016-10-02 ENCOUNTER — Encounter (HOSPITAL_COMMUNITY): Payer: Self-pay | Admitting: Anesthesiology

## 2016-10-02 ENCOUNTER — Encounter (HOSPITAL_COMMUNITY)
Admission: EM | Disposition: A | Discharge status: Home / Self Care | Payer: Self-pay | Source: Home / Self Care | Attending: Emergency Medicine

## 2016-10-02 ENCOUNTER — Other Ambulatory Visit: Payer: Self-pay | Admitting: Family Medicine

## 2016-10-02 DIAGNOSIS — N201 Calculus of ureter: Secondary | ICD-10-CM | POA: Diagnosis present

## 2016-10-02 HISTORY — PX: CYSTOSCOPY W/ URETERAL STENT PLACEMENT: SHX1429

## 2016-10-02 LAB — SURGICAL PCR SCREEN
MRSA, PCR: NEGATIVE
STAPHYLOCOCCUS AUREUS: NEGATIVE

## 2016-10-02 LAB — CBC
HEMATOCRIT: 41.7 % (ref 36.0–46.0)
HEMOGLOBIN: 14.3 g/dL (ref 12.0–15.0)
MCH: 30.4 pg (ref 26.0–34.0)
MCHC: 34.3 g/dL (ref 30.0–36.0)
MCV: 88.5 fL (ref 78.0–100.0)
Platelets: 252 10*3/uL (ref 150–400)
RBC: 4.71 MIL/uL (ref 3.87–5.11)
RDW: 13.4 % (ref 11.5–15.5)
WBC: 9.9 10*3/uL (ref 4.0–10.5)

## 2016-10-02 LAB — CREATININE, SERUM
CREATININE: 0.81 mg/dL (ref 0.44–1.00)
GFR calc Af Amer: >60 mL/min (ref >60)

## 2016-10-02 SURGERY — CYSTOSCOPY, WITH RETROGRADE PYELOGRAM AND URETERAL STENT INSERTION
Anesthesia: General | Site: Ureter | Laterality: Left

## 2016-10-02 MED ORDER — ONDANSETRON HCL 4 MG/2ML IJ SOLN
4.0000 mg | INTRAMUSCULAR | Status: DC | PRN
  Administered 2016-10-02 (×2): 4 mg via INTRAVENOUS
  Filled 2016-10-02 (×2): qty 2

## 2016-10-02 MED ORDER — IOHEXOL 300 MG/ML  SOLN
INTRAMUSCULAR | Status: DC | PRN
  Administered 2016-10-02: 10 mL via URETHRAL

## 2016-10-02 MED ORDER — HYDROMORPHONE HCL 2 MG/ML IJ SOLN
0.5000 mg | INTRAMUSCULAR | Status: DC | PRN
  Administered 2016-10-02 (×6): 1 mg via INTRAVENOUS
  Filled 2016-10-02 (×6): qty 1

## 2016-10-02 MED ORDER — LACTATED RINGERS IV SOLN
INTRAVENOUS | Status: DC | PRN
  Administered 2016-10-02: 18:00:00 via INTRAVENOUS

## 2016-10-02 MED ORDER — LACTATED RINGERS IV SOLN: INTRAVENOUS | Status: DC

## 2016-10-02 MED ORDER — SODIUM CHLORIDE 0.9 % IV SOLN: INTRAVENOUS | Status: DC | PRN

## 2016-10-02 MED ORDER — SODIUM CHLORIDE 0.45 % IV SOLN
INTRAVENOUS | Status: DC
  Administered 2016-10-02 (×2): via INTRAVENOUS

## 2016-10-02 MED ORDER — LIDOCAINE 2% (20 MG/ML) 5 ML SYRINGE
INTRAMUSCULAR | Status: DC | PRN
  Administered 2016-10-02: 50 mg via INTRAVENOUS

## 2016-10-02 MED ORDER — PROMETHAZINE HCL 25 MG/ML IJ SOLN
6.2500 mg | INTRAMUSCULAR | Status: DC | PRN
  Administered 2016-10-02: 12.5 mg via INTRAVENOUS

## 2016-10-02 MED ORDER — FENTANYL CITRATE (PF) 100 MCG/2ML IJ SOLN
INTRAMUSCULAR | Status: DC | PRN
  Administered 2016-10-02: 25 ug via INTRAVENOUS
  Administered 2016-10-02: 50 ug via INTRAVENOUS
  Administered 2016-10-02: 25 ug via INTRAVENOUS

## 2016-10-02 MED ORDER — OXYCODONE HCL 5 MG PO TABS
5.0000 mg | ORAL_TABLET | ORAL | Status: DC | PRN
  Filled 2016-10-02: qty 1

## 2016-10-02 MED ORDER — HYDROMORPHONE HCL 1 MG/ML IJ SOLN
INTRAMUSCULAR | Status: AC
  Administered 2016-10-02: 0.5 mg via INTRAVENOUS
  Filled 2016-10-02: qty 1

## 2016-10-02 MED ORDER — SULFAMETHOXAZOLE-TRIMETHOPRIM 800-160 MG PO TABS: 1.0000 | ORAL_TABLET | Freq: Two times a day (BID) | ORAL | 0 refills | Status: DC

## 2016-10-02 MED ORDER — PHENYLEPHRINE HCL 10 MG/ML IJ SOLN
INTRAMUSCULAR | Status: DC | PRN
  Administered 2016-10-02: 40 ug via INTRAVENOUS

## 2016-10-02 MED ORDER — OXYCODONE-ACETAMINOPHEN 5-325 MG PO TABS: 1.0000 | ORAL_TABLET | ORAL | 0 refills | Status: DC | PRN

## 2016-10-02 MED ORDER — HEPARIN SODIUM (PORCINE) 5000 UNIT/ML IJ SOLN
5000.0000 [IU] | Freq: Three times a day (TID) | INTRAMUSCULAR | Status: DC
  Administered 2016-10-02 (×2): 5000 [IU] via SUBCUTANEOUS
  Filled 2016-10-02 (×2): qty 1

## 2016-10-02 MED ORDER — HYDROMORPHONE HCL 1 MG/ML IJ SOLN
0.2500 mg | INTRAMUSCULAR | Status: DC | PRN
  Administered 2016-10-02 (×2): 0.5 mg via INTRAVENOUS

## 2016-10-02 MED ORDER — DIPHENHYDRAMINE HCL 50 MG/ML IJ SOLN
25.0000 mg | Freq: Four times a day (QID) | INTRAMUSCULAR | Status: DC | PRN
  Administered 2016-10-02: 25 mg via INTRAVENOUS
  Filled 2016-10-02: qty 1

## 2016-10-02 MED ORDER — PROMETHAZINE HCL 25 MG/ML IJ SOLN
INTRAMUSCULAR | Status: AC
  Administered 2016-10-02: 12.5 mg via INTRAVENOUS
  Filled 2016-10-02: qty 1

## 2016-10-02 MED ORDER — FENTANYL CITRATE (PF) 100 MCG/2ML IJ SOLN
INTRAMUSCULAR | Status: AC
  Filled 2016-10-02: qty 2

## 2016-10-02 MED ORDER — MIDAZOLAM HCL 2 MG/2ML IJ SOLN
INTRAMUSCULAR | Status: AC
  Filled 2016-10-02: qty 2

## 2016-10-02 MED ORDER — MEPERIDINE HCL 50 MG/ML IJ SOLN: 6.2500 mg | INTRAMUSCULAR | Status: DC | PRN

## 2016-10-02 MED ORDER — KETOROLAC TROMETHAMINE 30 MG/ML IJ SOLN
30.0000 mg | Freq: Four times a day (QID) | INTRAMUSCULAR | Status: DC
  Administered 2016-10-02 (×4): 30 mg via INTRAVENOUS
  Filled 2016-10-02 (×4): qty 1

## 2016-10-02 MED ORDER — PROPOFOL 10 MG/ML IV BOLUS
INTRAVENOUS | Status: AC
  Filled 2016-10-02: qty 20

## 2016-10-02 MED ORDER — MIDAZOLAM HCL 5 MG/5ML IJ SOLN
INTRAMUSCULAR | Status: DC | PRN
  Administered 2016-10-02: 2 mg via INTRAVENOUS

## 2016-10-02 MED ORDER — PROPOFOL 10 MG/ML IV BOLUS
INTRAVENOUS | Status: DC | PRN
  Administered 2016-10-02: 200 mg via INTRAVENOUS

## 2016-10-02 MED ORDER — CEFAZOLIN IN D5W 1 GM/50ML IV SOLN
1.0000 g | Freq: Three times a day (TID) | INTRAVENOUS | Status: DC
  Administered 2016-10-02 (×3): 1 g via INTRAVENOUS
  Filled 2016-10-02 (×4): qty 50

## 2016-10-02 SURGICAL SUPPLY — 24 items
BAG URO CATCHER STRL LF (MISCELLANEOUS) ×2 IMPLANT
BASKET DAKOTA 1.9FR 11X120 (BASKET) IMPLANT
BASKET LASER NITINOL 1.9FR (BASKET) IMPLANT
CATH FOLEY LATEX FREE 20FR (CATHETERS)
CATH FOLEY LF 20FR (CATHETERS) IMPLANT
CATH INTERMIT  6FR 70CM (CATHETERS) ×2 IMPLANT
CLOTH BEACON ORANGE TIMEOUT ST (SAFETY) ×2 IMPLANT
EXTRACTOR STONE NITINOL NGAGE (UROLOGICAL SUPPLIES) ×2 IMPLANT
FIBER LASER TRAC TIP (UROLOGICAL SUPPLIES) IMPLANT
GLOVE BIO SURGEON STRL SZ8 (GLOVE) ×2 IMPLANT
GOWN STRL REUS W/TWL XL LVL3 (GOWN DISPOSABLE) ×2 IMPLANT
GUIDEWIRE ANG ZIPWIRE 038X150 (WIRE) ×4 IMPLANT
GUIDEWIRE STR DUAL SENSOR (WIRE) ×2 IMPLANT
IV NS 1000ML (IV SOLUTION) ×1
IV NS 1000ML BAXH (IV SOLUTION) ×1 IMPLANT
MANIFOLD NEPTUNE II (INSTRUMENTS) ×2 IMPLANT
PACK CYSTO (CUSTOM PROCEDURE TRAY) ×2 IMPLANT
SHEATH ACCESS URETERAL 38CM (SHEATH) IMPLANT
STENT CONTOUR 6FRX26X.038 (STENTS) IMPLANT
STENT URET 6FRX24 CONTOUR (STENTS) ×2 IMPLANT
SYR CONTROL 10ML LL (SYRINGE) IMPLANT
SYRINGE IRR TOOMEY STRL 70CC (SYRINGE) IMPLANT
TUBE FEEDING 8FR 16IN STR KANG (MISCELLANEOUS) IMPLANT
TUBING CONNECTING 10 (TUBING) ×2 IMPLANT

## 2016-10-02 NOTE — Transfer of Care (Signed)
Immediate Anesthesia Transfer of Care Note  Patient: Melissa Allen  Procedure(s) Performed: Procedure(s): CYSTOSCOPY WITH RETROGRADE PYELOGRAM/URETERAL STENT PLACEMENT ureteroscopy, basket extraction (Left)  Patient Location: PACU  Anesthesia Type:General  Level of Consciousness:  sedated, patient cooperative and responds to stimulation  Airway & Oxygen Therapy:Patient Spontanous Breathing and Patient connected to face mask oxgen  Post-op Assessment:  Report given to PACU RN and Post -op Vital signs reviewed and stable  Post vital signs:  Reviewed and stable  Last Vitals:  Vitals:   10/02/16 0139 10/02/16 1351  BP: 140/77 136/62  Pulse: (!) 126 96  Resp: 18 18  Temp: 36.9 C [id number] C    Complications: No apparent anesthesia complications

## 2016-10-02 NOTE — Anesthesia Procedure Notes (Signed)
Procedure Name: LMA Insertion Date/Time: 10/02/2016 6:44 PM Performed by: Anne Fu Pre-anesthesia Checklist: Patient identified, Emergency Drugs available, Suction available, Patient being monitored and Timeout performed Patient Re-evaluated:Patient Re-evaluated prior to inductionOxygen Delivery Method: Circle system utilized Preoxygenation: Pre-oxygenation with 100% oxygen Intubation Type: IV induction Ventilation: Mask ventilation without difficulty LMA: LMA inserted LMA Size: 4.0 Number of attempts: 1 Placement Confirmation: positive ETCO2 and breath sounds checked- equal and bilateral Tube secured with: Tape

## 2016-10-02 NOTE — H&P (Signed)
H&P  Chief Complaint: Kidney stone  History of Present Illness: Melissa Allen is a 51 y.o. year old female admitted for management of persistent left distal ureteral calculi.  The patient has had a long-standing history of recurrent urolithiasis.  Over the past several days she has had persistent left lower quadrant pain, nausea, and is been seen twice in the office.  She has had intractable pain and presented to the emergency room for further management on 10/01/2016.  She was unable to be made pain-free, and urologic consultation is requested.  The patient was scheduled for ureteroscopy with removal of her stones next week.  However, because of her significant pain, she would like this done prior to that scheduled visit.  She's had no fever or chills.  She has had nausea and vomiting.  CT reveals persistent left distal ureteral stones.  Past Medical History:  Diagnosis Date  . Allergy   . Anginal pain ([place])    one episode 4-5 years ago-[facility]-nonspecific-no problems since-  . Anxiety   . Arthritis    multiple areas of arthritis- DDD  . Asthma    recent admit to ER 01/11/12 for exac of asthma  . Calcaneal fracture   . Duplicated ureter, right   . Fibromyalgia   . Hypertension   . Kidney stones   . Obesity (BMI 30.0-34.9)   . Pancreatitis 1986  . PONV (postoperative nausea and vomiting)   . Poor venous access    hard to start iv-  . 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 Shortness of breath dyspnea   . Wears glasses     Past Surgical History:  Procedure Laterality Date  . ABDOMINAL HYSTERECTOMY    . BLADDER SUSPENSION  03/01/2012   Procedure: TRANSVAGINAL TAPE (TVT) PROCEDURE;  Surgeon: Emily Filbert, MD;  Location: [facility];  Service: Gynecology;  Laterality: N/A;  . BUNIONECTOMY  1995   Right  . CHOLECYSTECTOMY  late 1980's      . CYSTOSCOPY  03/01/2012   Procedure: CYSTOSCOPY;  Surgeon: Emily Filbert, MD;  Location: [facility];  Service: Gynecology;  Laterality: N/A;  . CYSTOSCOPY W/ URETERAL STENT PLACEMENT Left 12/15/2014   Procedure: CYSTOSCOPY, LEFT URETEROSCOPYU WITH STONE EXTRACTION;  Surgeon: Irine Seal, MD;  Location: [facility];  Service: Urology;  Laterality: Left;  . INCISION AND DRAINAGE ABSCESS Left 07/19/2013   Procedure: INCISION AND DRAINAGE LEFT LOWER EXTERMITY HEMATOMA;  Surgeon: Imogene Burn. Georgette Dover, MD;  Location: [facility];  Service: General;  Laterality: Left;  Excision left lower leg mass  . KNEE SURGERY Left    x 2, Murphy/Sue, corrected Patella displacement   . LAMINECTOMY    . LUMBAR LAMINECTOMY  10/06   L4-5, Ray  . R compressed pronator Right    Sypher, R arm  . SPINAL FUSION    . TENDON REPAIR  2013  . TENDON REPAIR Right 02/01/12  . TONSILLECTOMY  age 1    Home Medications:  Medications Prior to Admission  Medication Sig Dispense Refill  . albuterol (PROAIR HFA) 108 (90 Base) MCG/ACT inhaler INHALE TWO PUFFS EVERY FOUR HOURS AS NEEDED SHORTNESS OF BREATH 8.5 g 2  . ALPRAZolam (XANAX) 0.25 MG tablet TAKE 1-2 TABLETS BY MOUTH DAILY (Patient taking differently: TAKE 1-2 TABLETS BY MOUTH DAILY AS NEEDED FOR ANXIETY) 60 tablet 0  . cyclobenzaprine (FLEXERIL) 10 MG tablet Take 10  mg by mouth as needed for muscle spasms (muscle spasms). Take 1 tablet (10 mg) daily at bedtime, may take an additional tablet during the day as needed for muscle spasms    . diphenhydrAMINE (BENADRYL) 25 MG tablet Take 25 mg by mouth every 6 (six) hours as needed for itching.    . DULoxetine (CYMBALTA) 60 MG capsule TAKE ONE CAPSULE EACH NIGHT AT BEDTIME *NEEDS VISIT FOR FURTHER REFILLS 90 capsule 3  . fluticasone (FLONASE) 50 MCG/ACT nasal spray USE 2 SPRAYS INTO EACH NOSTRIL ONCE DAILY AS DIRECTED. 16 g 5  . ipratropium-albuterol (DUONEB) 0.5-2.5 (3) MG/3ML SOLN Take 3 mLs by nebulization every 4 (four) hours as needed. (Patient taking differently: Take 3 mLs by nebulization  every 4 (four) hours as needed (shortness of breath/wheezing). ) 360 mL 1  . ketorolac (TORADOL) 10 MG tablet Take 10 mg by mouth 4 (four) times daily as needed for pain.    Marland Kitchen losartan (COZAAR) 50 MG tablet TAKE 1 TABLET BY MOUTH DAILY 90 tablet 1  . mometasone-formoterol (DULERA) 200-5 MCG/ACT AERO Inhale 2 puffs into the lungs 2 (two) times daily. 1 Inhaler 3  . oxyCODONE-acetaminophen (PERCOCET/ROXICET) 5-325 MG tablet Take 1 tablet by mouth every 4 (four) hours as needed for pain.    Marland Kitchen PREMARIN 0.3 MG tablet TAKE 1 TABLET BY MOUTH DAILY 90 tablet 0  . tamsulosin (FLOMAX) 0.4 MG CAPS capsule Take 0.4 mg by mouth daily.    Marland Kitchen triamterene-hydrochlorothiazide (MAXZIDE-25) 37.5-25 MG tablet TAKE 1 TABLET BY MOUTH DAILY 90 tablet 1  . URELLE (URELLE/URISED) 81 MG TABS tablet Take 1 tablet by mouth daily.    Marland Kitchen zolpidem (AMBIEN) 10 MG tablet TAKE ONE TABLET BY MOUTH EVERY NIGHT AT BEDTIME 30 tablet 0  . predniSONE (DELTASONE) 10 MG tablet 3 tabs by mouth per day for 3 days,2tabs per day for 3 days,1tab per day for 3 days (Patient not taking: Reported on 10/01/2016) 18 tablet 0    Allergies:  Allergies  Allergen Reactions  . Codeine Nausea And Vomiting  . Lisinopril Cough       . Hydrocodone Itching    Causes minor itching/ pt is currently taking (04/27/15)  . Tape Rash    Irritation to inside of thigh, /w PINK tape, can tolerate paper tape     Family History  Problem Relation Age of Onset  . Cancer Mother     pancreatic cancer  . Heart disease Father 79  . Colon polyps Father   . Colon cancer Paternal Grandfather   . Colon cancer Maternal Grandmother   . Uterine cancer      Grandmother  . Breast cancer      Grandmother  . Ovarian cancer      Grandmother  . Diabetes      mother  . Other Neg Hx     Social History:  reports that she has never smoked. She has never used smokeless tobacco. She reports that she drinks about 3.5 oz of alcohol per week . She reports that she does not  use drugs.  ROS: A complete review of systems was performed.  All systems are negative except for pertinent findings as noted.  Physical Exam:  Vital signs in last 24 hours: Temp:  [98.4 F (36.9 C)-99 F (37.2 C)] 98.4 F (36.9 C) (01/25 0139) Pulse Rate:  [106-135] 126 (01/25 0139) Resp:  [16-21] 18 (01/25 0139) BP: (106-140)/(64-82) 140/77 (01/25 0139) SpO2:  [95 %-100 %] 96 % (01/25 0139) Weight:  [  88.5 kg (195 lb)-93.3 kg (205 lb 9.6 oz)] 93.3 kg (205 lb 9.6 oz) (01/25 0139) General:  Alert and oriented, In moderate distress.  She is significantly anxious. HEENT: Normocephalic, atraumatic Neck: No JVD or lymphadenopathy Cardiovascular: Regular rate and rhythm Lungs: Clear bilaterally Abdomen: Obese, left lower quadrant tenderness. Back: Left CVA tenderness Extremities: No edema Neurologic: Grossly intact  Laboratory Data:  Results for orders placed or performed during the hospital encounter of 10/01/16 (from the past 24 hour(s))  Urinalysis, Routine w reflex microscopic- may I&O cath if menses     Status: Abnormal   Collection Time: 10/01/16  6:33 PM  Result Value Ref Range   Color, Urine STRAW (A) YELLOW   APPearance HAZY (A) CLEAR   Specific Gravity, Urine 1.005 1.005 - 1.030   pH 7.0 5.0 - 8.0   Glucose, UA NEGATIVE NEGATIVE mg/dL   Hgb urine dipstick SMALL (A) NEGATIVE   Bilirubin Urine NEGATIVE NEGATIVE   Ketones, ur NEGATIVE NEGATIVE mg/dL   Protein, ur NEGATIVE NEGATIVE mg/dL   Nitrite NEGATIVE NEGATIVE   Leukocytes, UA MODERATE (A) NEGATIVE   RBC / HPF 0-5 0 - 5 RBC/hpf   WBC, UA 6-30 0 - 5 WBC/hpf   Bacteria, UA MANY (A) NONE SEEN   Squamous Epithelial / LPF 6-30 (A) NONE SEEN   Mucous PRESENT   CBC with Differential     Status: None   Collection Time: 10/01/16  9:58 PM  Result Value Ref Range   WBC 10.4 4.0 - 10.5 K/uL   RBC 4.58 3.87 - 5.11 MIL/uL   Hemoglobin 13.5 12.0 - 15.0 g/dL   HCT 39.8 36.0 - 46.0 %   MCV 86.9 78.0 - 100.0 fL   MCH 29.5  26.0 - 34.0 pg   MCHC 33.9 30.0 - 36.0 g/dL   RDW 13.3 11.5 - 15.5 %   Platelets 267 150 - 400 K/uL   Neutrophils Relative % 70 %   Neutro Abs 7.3 1.7 - 7.7 K/uL   Lymphocytes Relative 22 %   Lymphs Abs 2.3 0.7 - 4.0 K/uL   Monocytes Relative 6 %   Monocytes Absolute 0.6 0.1 - 1.0 K/uL   Eosinophils Relative 1 %   Eosinophils Absolute 0.1 0.0 - 0.7 K/uL   Basophils Relative 1 %   Basophils Absolute 0.1 0.0 - 0.1 K/uL  I-stat Chem 8, ED     Status: Abnormal   Collection Time: 10/01/16 10:11 PM  Result Value Ref Range   Sodium 139 135 - 145 mmol/L   Potassium 3.2 (L) 3.5 - 5.1 mmol/L   Chloride 102 101 - 111 mmol/L   BUN 12 6 - 20 mg/dL   Creatinine, Ser 0.70 0.44 - 1.00 mg/dL   Glucose, Bld 99 65 - 99 mg/dL   Calcium, Ion 1.11 (L) 1.15 - 1.40 mmol/L   TCO2 25 0 - 100 mmol/L   Hemoglobin 13.6 12.0 - 15.0 g/dL   HCT 40.0 36.0 - 46.0 %  CBC     Status: None   Collection Time: 10/02/16  2:52 AM  Result Value Ref Range   WBC 9.9 4.0 - 10.5 K/uL   RBC 4.71 3.87 - 5.11 MIL/uL   Hemoglobin 14.3 12.0 - 15.0 g/dL   HCT 41.7 36.0 - 46.0 %   MCV 88.5 78.0 - 100.0 fL   MCH 30.4 26.0 - 34.0 pg   MCHC 34.3 30.0 - 36.0 g/dL   RDW 13.4 11.5 - 15.5 %   Platelets 252  150 - 400 K/uL  Creatinine, serum     Status: None   Collection Time: 10/02/16  2:52 AM  Result Value Ref Range   Creatinine, Ser 0.81 0.44 - 1.00 mg/dL   GFR calc non Af Amer >60 >60 mL/min   GFR calc Af Amer >60 >60 mL/min   No results found for this or any previous visit (from the past 240 hour(s)). Creatinine:  Recent Labs  10/01/16 2211 10/02/16 0252  CREATININE 0.70 0.81    Radiologic Imaging: Dg Abdomen 1 View  Result Date: 10/01/2016 CLINICAL DATA:  51 year old female with left flank pain. History of kidney stone. EXAM: ABDOMEN - 1 VIEW COMPARISON:  Abdominal CT dated 09/29/2016 FINDINGS: Evaluation is limited due to patient's body habitus. Moderate stool noted within the colon. There is no bowel dilatation  or evidence of obstruction. No free air or radiopaque calculi identified. Right upper quadrant cholecystectomy clips noted. L4-L5 interbody fusion and fixation hardware noted. No acute osseous pathology. IMPRESSION: No acute findings. Electronically Signed   By: Anner Crete M.D.   On: 10/01/2016 21:59    Impression/Assessment:  Persistent left distal ureteral calculi with intractable pain  Plan:  1.  Admit for pain management.  2.  The patient requests that we move her procedure up-I will schedule her for cystoscopy, retrograde pyelogram, left ureteroscopy with possible holmium laser and extraction of left ureteral stones.  The procedure as well as risks and complications have been discussed with the patient who desires to proceed.  Jorja Loa 10/02/2016, 8:12 AM  Lillette Boxer. Ranard Harte MD

## 2016-10-02 NOTE — Discharge Instructions (Signed)
Ureteroscopy, Care After This sheet gives you information about how to care for yourself after your procedure. Your health care provider may also give you more specific instructions. If you have problems or questions, contact your health care provider. What can I expect after the procedure? After the procedure, it is common to have:  A burning sensation when you urinate.  Blood in your urine.  Mild discomfort in the bladder area or kidney area when urinating.  Needing to urinate more often or urgently. Follow these instructions at home: Medicines  Take over-the-counter and prescription medicines only as told by your health care provider.  If you were prescribed an antibiotic medicine, take it as told by your health care provider. Do not stop taking the antibiotic even if you start to feel better. General instructions  Donot drive for 24 hours if you were given a medicine to help you relax (sedative) during your procedure.  To relieve burning, try taking a warm bath or holding a warm washcloth over your groin.  Drink enough fluid to keep your urine clear or pale yellow.  Drink two 8-ounce glasses of water every hour for the first 2 hours after you get home.  Continue to drink water often at home.  You can eat what you usually do.  Keep all follow-up visits as told by your health care provider. This is important.  If you had a tube placed to keep urine flowing (ureteral stent), ask your health care provider when you need to return to have it removed. Contact a health care provider if:  You have chills or a fever.  You have burning pain for longer than 24 hours after the procedure.  You have blood in your urine for longer than 24 hours after the procedure. Get help right away if:  You have large amounts of blood in your urine.  You have blood clots in your urine.  You have very bad pain.  You have chest pain or trouble breathing.  You are unable to urinate and you  have the feeling of a full bladder. This information is not intended to replace advice given to you by your health care provider. Make sure you discuss any questions you have with your health care provider. Document Released: 08/30/2013 Document Revised: 06/10/2016 Document Reviewed: 06/06/2016 Elsevier Interactive Patient Education  2017 [place] IN 72 HOURS BY GENTLY PULLING THE STRING

## 2016-10-02 NOTE — Op Note (Signed)
.  Preoperative diagnosis: Left ureteral stone  Postoperative diagnosis: Same  Procedure: 1 cystoscopy 2. Left retrograde pyelography 3.  Intraoperative fluoroscopy, under one hour, with interpretation 4.  Left ureteroscopic stone manipulation with laser lithotripsy 5.  Left 6 x 24 JJ stent placement  Attending: Rosie Fate  Anesthesia: General  Estimated blood loss: None  Drains: Left 6 x 26 JJ ureteral stent with tether  Specimens: stone for analysis  Antibiotics: ancef  Findings: left distal ureteral stones. Moderate hydronephrosis. No masses/lesions in the bladder. Ureteral orifices in normal anatomic location.  Indications: Patient is a 51 year old female with a history of left distal ureteral stones and who has persistent left flank pain.  After discussing treatment options, she decided proceed with left ureteroscopic stone manipulation.  Procedure her in detail: The patient was brought to the operating room and a brief timeout was done to ensure correct patient, correct procedure, correct site.  General anesthesia was administered patient was placed in dorsal lithotomy position.  Her genitalia was then prepped and draped in usual sterile fashion.  A rigid 24 French cystoscope was passed in the urethra and the bladder.  Bladder was inspected free masses or lesions.  the ureteral orifices were in the normal orthotopic locations.  a 6 french ureteral catheter was then instilled into the left ureteral orifice.  a gentle retrograde was obtained and findings noted above.  we then placed a zip wire through the ureteral catheter and advanced up to the renal pelvis.  we then removed the cystoscope and cannulated the left ureteral orifice with a semirigid ureteroscope.  We encountered 2 stones int he distal ureter. Using an NGage basket the stones were removed. e then placed a 6 x 24 double-j ureteral stent over the original zip wire. We then removed the wire and good coil was noted in the  the renal pelvis under fluoroscopy and the bladder under direct vision.  the stone fragments were then removed from the bladder and sent for analysis.   the bladder was then drained and this concluded the procedure which was well tolerated by patient.  Complications: None  Condition: Stable, extubated, transferred to PACU  Plan: Patient is to be discharged home as to follow-up in 2 weeks. She is to remove her stent by pulling the tether in 72 hours

## 2016-10-02 NOTE — Anesthesia Postprocedure Evaluation (Signed)
Anesthesia Post Note  Patient: Melissa Allen  Procedure(s) Performed: Procedure(s) (LRB): CYSTOSCOPY WITH RETROGRADE PYELOGRAM/URETERAL STENT PLACEMENT ureteroscopy, basket extraction (Left)  Patient location during evaluation: PACU Anesthesia Type: General Level of consciousness: sedated and patient cooperative Pain management: pain level controlled Vital Signs Assessment: post-procedure vital signs reviewed and stable Respiratory status: spontaneous breathing Cardiovascular status: stable Anesthetic complications: no       Last Vitals:  Vitals:   10/02/16 1953 10/02/16 2007  BP: (!) 121/56 138/89  Pulse: 92 90  Resp: 18 18  Temp:  36.7 C    Last Pain:  Vitals:   10/02/16 2021  TempSrc:   PainSc: [place]

## 2016-10-02 NOTE — Anesthesia Preprocedure Evaluation (Signed)
Anesthesia Evaluation  Patient identified by MRN, date of birth, ID band Patient awake    Reviewed: Allergy & Precautions, NPO status , Patient's Chart, lab work & pertinent test results  History of Anesthesia Complications (+) PONV and history of anesthetic complications  Airway Mallampati: II  TM Distance: >3 FB Neck ROM: Full    Dental  (+) Teeth Intact, Dental Advisory Given   Pulmonary shortness of breath, asthma ,    Pulmonary exam normal        Cardiovascular hypertension, + angina + Peripheral Vascular Disease  Normal cardiovascular exam Rhythm:Regular     Neuro/Psych Seizures -, Well Controlled,  PSYCHIATRIC DISORDERS Anxiety Depression  Neuromuscular disease    GI/Hepatic Neg liver ROS, GERD  ,  Endo/Other  negative endocrine ROS  Renal/GU Renal disease     Musculoskeletal  (+) Arthritis , Fibromyalgia -  Abdominal   Peds  Hematology negative hematology ROS (+)   Anesthesia Other Findings   Reproductive/Obstetrics                            Anesthesia Physical  Anesthesia Plan  ASA: III and emergent  Anesthesia Plan: General   Post-op Pain Management:    Induction: Intravenous  Airway Management Planned: Oral ETT  Additional Equipment:   Intra-op Plan:   Post-operative Plan: Extubation in OR  Informed Consent: I have reviewed the patients History and Physical, chart, labs and discussed the procedure including the risks, benefits and alternatives for the proposed anesthesia with the patient or authorized representative who has indicated his/her understanding and acceptance.   Dental advisory given  Plan Discussed with: CRNA, Anesthesiologist and Surgeon  Anesthesia Plan Comments:        Anesthesia Quick Evaluation

## 2016-10-03 ENCOUNTER — Encounter (HOSPITAL_COMMUNITY): Payer: Self-pay | Admitting: Urology

## 2016-10-03 NOTE — Telephone Encounter (Signed)
Refill one time only.  Needs OV for further refills. 

## 2016-10-03 NOTE — Telephone Encounter (Signed)
pts last grief/depression f/u 11/2014

## 2016-10-03 NOTE — Telephone Encounter (Signed)
Rx called in to requested pharmacy. Spoke to pt advised OV required; scheduled

## 2016-10-07 ENCOUNTER — Ambulatory Visit (HOSPITAL_BASED_OUTPATIENT_CLINIC_OR_DEPARTMENT_OTHER): Admit: 2016-10-07 | Payer: BC Managed Care – PPO | Admitting: Urology

## 2016-10-07 ENCOUNTER — Encounter (HOSPITAL_BASED_OUTPATIENT_CLINIC_OR_DEPARTMENT_OTHER): Payer: Self-pay

## 2016-10-07 SURGERY — CYSTOSCOPY/URETEROSCOPY/HOLMIUM LASER/STENT PLACEMENT
Anesthesia: General | Laterality: Left

## 2016-10-09 ENCOUNTER — Ambulatory Visit (INDEPENDENT_AMBULATORY_CARE_PROVIDER_SITE_OTHER): Payer: BC Managed Care – PPO | Admitting: Family Medicine

## 2016-10-09 ENCOUNTER — Encounter: Payer: Self-pay | Admitting: Family Medicine

## 2016-10-09 ENCOUNTER — Other Ambulatory Visit: Payer: Self-pay | Admitting: Family Medicine

## 2016-10-09 VITALS — BP 124/84 | HR 115 | Temp 98.0°F | Wt 201.5 lb

## 2016-10-09 DIAGNOSIS — F339 Major depressive disorder, recurrent, unspecified: Secondary | ICD-10-CM

## 2016-10-09 DIAGNOSIS — N201 Calculus of ureter: Secondary | ICD-10-CM | POA: Diagnosis not present

## 2016-10-09 DIAGNOSIS — N200 Calculus of kidney: Secondary | ICD-10-CM

## 2016-10-09 MED ORDER — BUDESONIDE-FORMOTEROL FUMARATE 160-4.5 MCG/ACT IN AERO: 2.0000 | INHALATION_SPRAY | Freq: Two times a day (BID) | RESPIRATORY_TRACT | 3 refills | Status: DC

## 2016-10-09 MED ORDER — DULOXETINE HCL 60 MG PO CPEP: ORAL_CAPSULE | ORAL | 3 refills | Status: DC

## 2016-10-09 MED ORDER — ZOLPIDEM TARTRATE 10 MG PO TABS: ORAL_TABLET | ORAL | 1 refills | Status: DC

## 2016-10-09 NOTE — Progress Notes (Signed)
Subjective:   Patient ID: Melissa Allen, female    DOB: June 14, 1966, 51 y.o.   MRN: [id number]  Melissa Allen is a pleasant 51 y.o. year old female who presents to clinic today with Hospitalization Follow-up (kidney stones) and Medication Refill  on 10/09/2016  HPI: Presented to [facility] ER on 10/01/16 with "worsening pain from kidney stone."  Notes reviewed.  Had seen [facility] the previous Friday and had a ultrasound, xray and an CT.  Was told she had 2 distal stones on the left side and was sent home with Percocet.  She does have a remote h/o stones require retrieval and stents in the past.  Was scheduled for this procedure but presented to the ER with worsening pain prior to the procedure date.  Given dilaudid in ER and admitted to the hospital.  Was seen by Dr. Diona Fanti in the hospital the following day.  Notes reviewed. On 10/02/16 a cystoscopy, lithotripsy and stent placement was performed.  Arelis pulled out stent 36 hours later.   Has been a rough few months but Cymbalta is helping.  Asking for a refill. Lab Results  Component Value Date   WBC 9.9 10/02/2016   HGB 14.3 10/02/2016   HCT 41.7 10/02/2016   MCV 88.5 10/02/2016   PLT 252 10/02/2016   Lab Results  Component Value Date   CREATININE 0.81 10/02/2016    Current Outpatient Prescriptions on File Prior to Visit  Medication Sig Dispense Refill  . albuterol (PROAIR HFA) 108 (90 Base) MCG/ACT inhaler INHALE TWO PUFFS EVERY FOUR HOURS AS NEEDED SHORTNESS OF BREATH 8.5 g 2  . ALPRAZolam (XANAX) 0.25 MG tablet TAKE 1-2 TABLETS BY MOUTH DAILY (Patient taking differently: TAKE 1-2 TABLETS BY MOUTH DAILY AS NEEDED FOR ANXIETY) 60 tablet 0  . cyclobenzaprine (FLEXERIL) 10 MG tablet Take 10 mg by mouth as needed for muscle spasms (muscle spasms). Take 1 tablet (10 mg) daily at bedtime, may take an additional tablet during the day as needed for muscle spasms    . diphenhydrAMINE (BENADRYL) 25 MG tablet Take 25 mg by mouth every  6 (six) hours as needed for itching.    . fluticasone (FLONASE) 50 MCG/ACT nasal spray USE 2 SPRAYS INTO EACH NOSTRIL ONCE DAILY AS DIRECTED. 16 g 5  . ipratropium-albuterol (DUONEB) 0.5-2.5 (3) MG/3ML SOLN Take 3 mLs by nebulization every 4 (four) hours as needed. (Patient taking differently: Take 3 mLs by nebulization every 4 (four) hours as needed (shortness of breath/wheezing). ) 360 mL 1  . losartan (COZAAR) 50 MG tablet TAKE 1 TABLET BY MOUTH DAILY 90 tablet 1  . PREMARIN 0.3 MG tablet TAKE 1 TABLET BY MOUTH DAILY 90 tablet 0  . tamsulosin (FLOMAX) 0.4 MG CAPS capsule Take 0.4 mg by mouth daily.    Marland Kitchen triamterene-hydrochlorothiazide (MAXZIDE-25) 37.5-25 MG tablet TAKE 1 TABLET BY MOUTH DAILY 90 tablet 1  . mometasone-formoterol (DULERA) 200-5 MCG/ACT AERO Inhale 2 puffs into the lungs 2 (two) times daily. (Patient not taking: Reported on 10/09/2016) 1 Inhaler 3   No current facility-administered medications on file prior to visit.     Allergies  Allergen Reactions  . Codeine Nausea And Vomiting  . Lisinopril Cough       . Hydrocodone Itching    Causes minor itching/ pt is currently taking (04/27/15)  . Tape Rash    Irritation to inside of thigh, /w PINK tape, can tolerate paper tape     Past Medical History:  Diagnosis Date  .  Allergy   . Anginal pain ([place])    one episode 4-5 years ago-[facility] [place]-nonspecific-no problems since-  . Anxiety   . Arthritis    multiple areas of arthritis- DDD  . Asthma    recent admit to ER 01/11/12 for exac of asthma  . Calcaneal fracture   . Duplicated ureter, right   . Fibromyalgia   . Hypertension   . Kidney stones   . Obesity (BMI 30.0-34.9)   . Pancreatitis 1986  . PONV (postoperative nausea and vomiting)   . Poor venous access    hard to start iv-  . 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 Shortness of breath dyspnea   . Wears glasses     Past Surgical  History:  Procedure Laterality Date  . ABDOMINAL HYSTERECTOMY    . BLADDER SUSPENSION  03/01/2012   Procedure: TRANSVAGINAL TAPE (TVT) PROCEDURE;  Surgeon: Emily Filbert, MD;  Location: [facility];  Service: Gynecology;  Laterality: N/A;  . BUNIONECTOMY  1995   Right  . CHOLECYSTECTOMY  late 1980's      . CYSTOSCOPY  03/01/2012   Procedure: CYSTOSCOPY;  Surgeon: Emily Filbert, MD;  Location: [facility];  Service: Gynecology;  Laterality: N/A;  . CYSTOSCOPY W/ URETERAL STENT PLACEMENT Left 12/15/2014   Procedure: CYSTOSCOPY, LEFT URETEROSCOPYU WITH STONE EXTRACTION;  Surgeon: Irine Seal, MD;  Location: [facility];  Service: Urology;  Laterality: Left;  . CYSTOSCOPY W/ URETERAL STENT PLACEMENT Left 10/02/2016   Procedure: CYSTOSCOPY WITH RETROGRADE PYELOGRAM/URETERAL STENT PLACEMENT ureteroscopy, basket extraction;  Surgeon: Cleon Gustin, MD;  Location: [facility];  Service: Urology;  Laterality: Left;  . INCISION AND DRAINAGE ABSCESS Left 07/19/2013   Procedure: INCISION AND DRAINAGE LEFT LOWER EXTERMITY HEMATOMA;  Surgeon: Imogene Burn. Georgette Dover, MD;  Location: [facility];  Service: General;  Laterality: Left;  Excision left lower leg mass  . KNEE SURGERY Left    x 2, Murphy/Sue, corrected Patella displacement   . LAMINECTOMY    . LUMBAR LAMINECTOMY  10/06   L4-5, Ray  . R compressed pronator Right    Sypher, R arm  . SPINAL FUSION    . TENDON REPAIR  2013  . TENDON REPAIR Right 02/01/12  . TONSILLECTOMY  age 48    Family History  Problem Relation Age of Onset  . Cancer Mother     pancreatic cancer  . Heart disease Father 26  . Colon polyps Father   . Colon cancer Paternal Grandfather   . Colon cancer Maternal Grandmother   . Uterine cancer      Grandmother  . Breast cancer      Grandmother  . Ovarian cancer      Grandmother  . Diabetes      mother  . Other Neg Hx     Social History   Social History  . Marital status: Married    Spouse name: N/A  . Number of children: 3  .  Years of education: N/A   Occupational History  . [employer] (6th grade)   Social History Main Topics  . Smoking status: Never Smoker  . Smokeless tobacco: Never Used  . Alcohol use 3.5 oz/week    7 Standard drinks or equivalent per week     Comment: Occasional- wine or liquor   . Drug use: No  . Sexual activity: Yes  Birth control/ protection: Surgical   Other Topics Concern  . 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   Review of Systems  Constitutional: Negative.   Eyes: Negative.   Respiratory: Negative.   Cardiovascular: Negative.   Gastrointestinal: Negative.   Genitourinary: Negative.   Musculoskeletal: Positive for back pain.  Allergic/Immunologic: Negative.   Neurological: Negative.   Hematological: Negative.   Psychiatric/Behavioral: Negative.   All other systems reviewed and are negative.      Objective:    BP 124/84   Pulse (!) 115   Temp 98 F (36.7 C) (Oral)   Wt 201 lb 8 oz (91.4 kg)   LMP 12/21/2011   SpO2 98%   BMI 34.59 kg/m    Physical Exam   General:  Well-developed,well-nourished,in no acute distress; alert,appropriate and cooperative throughout examination Head:  normocephalic and atraumatic.   Eyes:  vision grossly intact, PERRL Ears:  R ear normal and L ear normal externally, TMs clear bilaterally Nose:  no external deformity.   Mouth:  good dentition.   Neck:  No deformities, masses, or tenderness noted. Lungs:  Normal respiratory effort, chest expands symmetrically. Lungs are clear to auscultation, no crackles or wheezes. Heart:  Normal rate and regular rhythm. S1 and S2 normal without gallop,  murmur, click, rub or other extra sounds. Abdomen:  Bowel sounds positive,abdomen soft and non-tender without masses, organomegaly or hernias noted. Msk:  No deformity or scoliosis noted of thoracic or lumbar spine.   Extremities:  No clubbing, cyanosis, edema, or deformity noted with normal full range of motion of all joints.   Neurologic:  alert & oriented X3 and gait normal.   Skin:  Intact without suspicious lesions or rashes Psych:  Cognition and judgment appear intact. Alert and cooperative with normal attention span and concentration. No apparent delusions, illusions, hallucinations       Assessment & Plan:   Ureteric stone  RENAL CALCULUS No Follow-up on file.

## 2016-10-09 NOTE — Telephone Encounter (Signed)
Pt was seen in office today

## 2016-10-09 NOTE — Patient Instructions (Signed)
Great to see you!   

## 2016-10-09 NOTE — Progress Notes (Signed)
Pre visit review using our clinic review tool, if applicable. No additional management support is needed unless otherwise documented below in the visit note. 

## 2016-10-09 NOTE — Assessment & Plan Note (Signed)
Has follow scheduled with Dr. Beatrix Fetters next week. Overall, she is doing very well. Her back hurts but she feels this is unrelated.

## 2016-10-09 NOTE — Assessment & Plan Note (Signed)
Stable with cymbalta. eRx refills sent.

## 2016-10-10 NOTE — Telephone Encounter (Signed)
Rx called in to requested pharmacy 

## 2016-10-14 NOTE — Discharge Summary (Signed)
Physician Discharge Summary  Patient ID: Melissa Allen MRN: [id number] DOB/AGE: 02/22/1966 51 y.o.  Admit date: 10/01/2016 Discharge date: 10/02/2016  Admission Diagnoses: Ureteral stone Discharge Diagnoses:  Active Problems:   Ureteric stone   Discharged Condition: good  Hospital Course: The patient tolerated the procedure well and was transferred to the floor on IV pain meds, IV fluid.  Prior to discharge the pt was tolerating a regular diet, pain was controlled on PO pain meds, they were ambulating without difficulty, and they had normal bowel function.   Consults: None  Significant Diagnostic Studies: none  Treatments: surgery: ureteroscopic stone extraction  Discharge Exam: Blood pressure 138/89, pulse 90, temperature 98 F (36.7 C), temperature source Oral, resp. rate 18, height 5\' 4"  (1.626 m), weight 93.3 kg (205 lb 9.6 oz), last menstrual period 12/21/2011, SpO2 95 %. General appearance: alert, cooperative and appears stated age Head: Normocephalic, without obvious abnormality, atraumatic Eyes: conjunctivae/corneas clear. PERRL, EOM's intact. Fundi benign. Nose: Nares normal. Septum midline. Mucosa normal. No drainage or sinus tenderness. Resp: clear to auscultation bilaterally Cardio: regular rate and rhythm, S1, S2 normal, no murmur, click, rub or gallop GI: soft, non-tender; bowel sounds normal; no masses,  no organomegaly Extremities: extremities normal, atraumatic, no cyanosis or edema Pulses: 2+ and symmetric  Disposition: 01-Home or Self Care  Discharge Instructions    Discharge patient    Complete by:  As directed    Discharge disposition:  01-Home or Self Care   Discharge patient date:  10/02/2016     Allergies as of 10/02/2016      Reactions   Codeine Nausea And Vomiting   Lisinopril Cough      Hydrocodone Itching   Causes minor itching/ pt is currently taking (04/27/15)   Tape Rash   Irritation to inside of thigh, /w PINK tape, can tolerate paper  tape       Medication List    STOP taking these medications   oxyCODONE-acetaminophen 5-325 MG tablet Commonly known as:  PERCOCET/ROXICET     TAKE these medications   albuterol 108 (90 Base) MCG/ACT inhaler Commonly known as:  PROAIR HFA INHALE TWO PUFFS EVERY FOUR HOURS AS NEEDED SHORTNESS OF BREATH   cyclobenzaprine 10 MG tablet Commonly known as:  FLEXERIL Take 10 mg by mouth as needed for muscle spasms (muscle spasms). Take 1 tablet (10 mg) daily at bedtime, may take an additional tablet during the day as needed for muscle spasms   diphenhydrAMINE 25 MG tablet Commonly known as:  BENADRYL Take 25 mg by mouth every 6 (six) hours as needed for itching.   fluticasone 50 MCG/ACT nasal spray Commonly known as:  FLONASE USE 2 SPRAYS INTO EACH NOSTRIL ONCE DAILY AS DIRECTED.   ipratropium-albuterol 0.5-2.5 (3) MG/3ML Soln Commonly known as:  DUONEB Take 3 mLs by nebulization every 4 (four) hours as needed. What changed:  reasons to take this   losartan 50 MG tablet Commonly known as:  COZAAR TAKE 1 TABLET BY MOUTH DAILY   mometasone-formoterol 200-5 MCG/ACT Aero Commonly known as:  DULERA Inhale 2 puffs into the lungs 2 (two) times daily.   PREMARIN 0.3 MG tablet Generic drug:  estrogens (conjugated) TAKE 1 TABLET BY MOUTH DAILY   tamsulosin 0.4 MG Caps capsule Commonly known as:  FLOMAX Take 0.4 mg by mouth daily.   triamterene-hydrochlorothiazide 37.5-25 MG tablet Commonly known as:  MAXZIDE-25 TAKE 1 TABLET BY MOUTH DAILY      Follow-up Information    Nicolette Bang, MD.  Call in 2 weeks.   Specialty:  Urology Contact information: [address] [place] [address] [phone]           Signed: Nicolette Bang 10/14/2016, 6:55 AM

## 2016-11-10 ENCOUNTER — Other Ambulatory Visit: Payer: Self-pay | Admitting: Family Medicine

## 2016-11-10 NOTE — Telephone Encounter (Signed)
Last filled 10-10-16 #60 Last OV 10-09-16 Acute. No Future OV

## 2016-11-11 NOTE — Telephone Encounter (Signed)
Left refill on voice mail at pharmacy  

## 2016-11-28 ENCOUNTER — Other Ambulatory Visit: Payer: Self-pay | Admitting: Neurosurgery

## 2016-12-01 ENCOUNTER — Other Ambulatory Visit: Payer: Self-pay | Admitting: Family Medicine

## 2016-12-01 NOTE — Telephone Encounter (Signed)
Called Rx to the pharmacy 

## 2016-12-01 NOTE — Telephone Encounter (Signed)
Pt last seen 10/09/2016,  Last refill 10/09/2016.Marland KitchenMarland Kitchenplease advise

## 2016-12-14 NOTE — Pre-Procedure Instructions (Signed)
DESAREE DOWNEN  12/14/2016      [facility] [address], [place] [address] Phone: [phone] Fax: [phone]  [id number] [place], [place] [address] [place] [address] Phone: [phone] Fax: [phone]  [facility], [place] - [address] [place] [address] Phone: [phone] Fax: [phone]    Your procedure is scheduled on Mon, April 16 @ 12:05 PM  Report to [facility] at 10:00 AM  Call this number if you have problems the morning of surgery:  [phone]   Remember:  Do not eat food or drink liquids after midnight.  Take these medicines the morning of surgery with A SIP OF WATER Albuterol<Bring Your Inhaler With You>,Alprazolam(Xanax),Flonase(Fluticasone),DuoNeb,Dulera<Bring Your Inhaler With You>, and Eye Drops(if needed)             A week prior to surgery No Goody's,BC's,Aleve,Advil,Motrin,Ibuprofen,Fish Oil,or any Herbal Medications.    Do not wear jewelry, make-up or nail polish.  Do not wear lotions, powders,perfumes, or deoderant.  Do not shave 48 hours prior to surgery.    Do not bring valuables to the hospital.  [facility] is not responsible for any belongings or valuables.  Contacts, dentures or bridgework may not be worn into surgery.  Leave your suitcase in the car.  After surgery it may be brought to your room.  For patients admitted to the hospital, discharge time will be determined by your treatment team.  Patients discharged the day of surgery will not be allowed to drive home.    Special instructions:   [facility] - Preparing for Surgery  Before surgery, you can play an important role.  Because skin is not sterile, your skin needs to be as free of germs as possible.  You can reduce the number of germs on you skin by  washing with CHG (chlorahexidine gluconate) soap before surgery.  CHG is an antiseptic cleaner which kills germs and bonds with the skin to continue killing germs even after washing.  Please DO NOT use if you have an allergy to CHG or antibacterial soaps.  If your skin becomes reddened/irritated stop using the CHG and inform your nurse when you arrive at Short Stay.  Do not shave (including legs and underarms) for at least 48 hours prior to the first CHG shower.  You may shave your face.  Please follow these instructions carefully:   1.  Shower with CHG Soap the night before surgery and the                                morning of Surgery.  2.  If you choose to wash your hair, wash your hair first as usual with your       normal shampoo.  3.  After you shampoo, rinse your hair and body thoroughly to remove the                      Shampoo.  4.  Use CHG as you would any other liquid soap.  You can apply chg directly       to the skin and wash gently with scrungie or a clean washcloth.  5.  Apply the CHG Soap to your body ONLY  FROM THE NECK DOWN.        Do not use on open wounds or open sores.  Avoid contact with your eyes,       ears, mouth and genitals (private parts).  Wash genitals (private parts)       with your normal soap.  6.  Wash thoroughly, paying special attention to the area where your surgery        will be performed.  7.  Thoroughly rinse your body with warm water from the neck down.  8.  DO NOT shower/wash with your normal soap after using and rinsing off       the CHG Soap.  9.  Pat yourself dry with a clean towel.            10.  Wear clean pajamas.            11.  Place clean sheets on your bed the night of your first shower and do not        sleep with pets.  Day of Surgery  Do not apply any lotions/deoderants the morning of surgery.  Please wear clean clothes to the hospital/surgery center.   Please read over the following fact sheets that you were given. Pain Booklet,  Coughing and Deep Breathing, MRSA Information and Surgical Site Infection Prevention

## 2016-12-15 ENCOUNTER — Encounter (HOSPITAL_COMMUNITY): Payer: Self-pay

## 2016-12-15 ENCOUNTER — Encounter (HOSPITAL_COMMUNITY)
Admission: RE | Admit: 2016-12-15 | Discharge: 2016-12-15 | Disposition: A | Discharge status: Home / Self Care | Payer: BC Managed Care – PPO | Source: Ambulatory Visit | Attending: Neurosurgery | Admitting: Neurosurgery

## 2016-12-15 DIAGNOSIS — M5416 Radiculopathy, lumbar region: Secondary | ICD-10-CM | POA: Diagnosis not present

## 2016-12-15 DIAGNOSIS — Z01812 Encounter for preprocedural laboratory examination: Secondary | ICD-10-CM | POA: Diagnosis present

## 2016-12-15 LAB — CBC WITH DIFFERENTIAL/PLATELET
Basophils Absolute: 0.1 10*3/uL (ref 0.0–0.1)
Basophils Relative: 1 %
EOS PCT: 2 %
Eosinophils Absolute: 0.2 10*3/uL (ref 0.0–0.7)
HCT: 41.5 % (ref 36.0–46.0)
Hemoglobin: 14 g/dL (ref 12.0–15.0)
LYMPHS ABS: 2 10*3/uL (ref 0.7–4.0)
LYMPHS PCT: 26 %
MCH: 30.6 pg (ref 26.0–34.0)
MCHC: 33.7 g/dL (ref 30.0–36.0)
MCV: 90.6 fL (ref 78.0–100.0)
MONOS PCT: 7 %
Monocytes Absolute: 0.5 10*3/uL (ref 0.1–1.0)
Neutro Abs: 4.9 10*3/uL (ref 1.7–7.7)
Neutrophils Relative %: 64 %
PLATELETS: 230 10*3/uL (ref 150–400)
RBC: 4.58 MIL/uL (ref 3.87–5.11)
RDW: 13.1 % (ref 11.5–15.5)
WBC: 7.6 10*3/uL (ref 4.0–10.5)

## 2016-12-15 LAB — BASIC METABOLIC PANEL
Anion gap: 9 (ref 5–15)
BUN: 9 mg/dL (ref 6–20)
CHLORIDE: 101 mmol/L (ref 101–111)
CO2: 25 mmol/L (ref 22–32)
Calcium: 9.3 mg/dL (ref 8.9–10.3)
Creatinine, Ser: 0.63 mg/dL (ref 0.44–1.00)
GFR calc Af Amer: >60 mL/min (ref >60)
GFR calc non Af Amer: >60 mL/min (ref >60)
GLUCOSE: 104 mg/dL — AB (ref 65–99)
POTASSIUM: 3.4 mmol/L — AB (ref 3.5–5.1)
SODIUM: 135 mmol/L (ref 135–145)

## 2016-12-15 LAB — SURGICAL PCR SCREEN
MRSA, PCR: NEGATIVE
Staphylococcus aureus: NEGATIVE

## 2016-12-15 MED ORDER — CHLORHEXIDINE GLUCONATE CLOTH 2 % EX PADS: 6.0000 | MEDICATED_PAD | Freq: Once | CUTANEOUS | Status: DC

## 2016-12-15 NOTE — Progress Notes (Signed)
PCP: Arnette Norris No cardiologist or cardiac hx per pt, pt states she did have chest pain years ago, everything was fine  ECHO 2015 EKG: 12/31/15  Pt denies chest pain SOB or signs of infection.   Pt states she is a very difficult IV stick and has had to have IVs in her foot/central lines.

## 2016-12-19 ENCOUNTER — Other Ambulatory Visit: Payer: Self-pay | Admitting: Family Medicine

## 2016-12-22 ENCOUNTER — Encounter (HOSPITAL_COMMUNITY)
Admission: RE | Disposition: A | Discharge status: Home / Self Care | Payer: Self-pay | Source: Ambulatory Visit | Attending: Neurosurgery

## 2016-12-22 ENCOUNTER — Observation Stay (HOSPITAL_COMMUNITY)
Admission: RE | Admit: 2016-12-22 | Discharge: 2016-12-23 | Disposition: A | Discharge status: Home / Self Care | Payer: BC Managed Care – PPO | Source: Ambulatory Visit | Attending: Neurosurgery | Admitting: Neurosurgery

## 2016-12-22 ENCOUNTER — Ambulatory Visit (HOSPITAL_COMMUNITY): Payer: BC Managed Care – PPO | Admitting: Anesthesiology

## 2016-12-22 ENCOUNTER — Encounter (HOSPITAL_COMMUNITY): Payer: Self-pay | Admitting: *Deleted

## 2016-12-22 DIAGNOSIS — Z79899 Other long term (current) drug therapy: Secondary | ICD-10-CM | POA: Diagnosis not present

## 2016-12-22 DIAGNOSIS — T8484XA Pain due to internal orthopedic prosthetic devices, implants and grafts, initial encounter: Secondary | ICD-10-CM | POA: Diagnosis not present

## 2016-12-22 DIAGNOSIS — I10 Essential (primary) hypertension: Secondary | ICD-10-CM | POA: Diagnosis not present

## 2016-12-22 DIAGNOSIS — M549 Dorsalgia, unspecified: Secondary | ICD-10-CM | POA: Diagnosis present

## 2016-12-22 DIAGNOSIS — F419 Anxiety disorder, unspecified: Secondary | ICD-10-CM | POA: Diagnosis not present

## 2016-12-22 DIAGNOSIS — Y838 Other surgical procedures as the cause of abnormal reaction of the patient, or of later complication, without mention of misadventure at the time of the procedure: Secondary | ICD-10-CM | POA: Diagnosis not present

## 2016-12-22 DIAGNOSIS — Z7951 Long term (current) use of inhaled steroids: Secondary | ICD-10-CM | POA: Diagnosis not present

## 2016-12-22 DIAGNOSIS — J45909 Unspecified asthma, uncomplicated: Secondary | ICD-10-CM | POA: Insufficient documentation

## 2016-12-22 DIAGNOSIS — Z981 Arthrodesis status: Secondary | ICD-10-CM | POA: Insufficient documentation

## 2016-12-22 DIAGNOSIS — I739 Peripheral vascular disease, unspecified: Secondary | ICD-10-CM | POA: Diagnosis not present

## 2016-12-22 HISTORY — PX: HARDWARE REMOVAL: SHX979

## 2016-12-22 SURGERY — REMOVAL, HARDWARE
Anesthesia: General | Site: Back

## 2016-12-22 MED ORDER — ALPRAZOLAM 0.25 MG PO TABS
0.2500 mg | ORAL_TABLET | Freq: Two times a day (BID) | ORAL | Status: DC | PRN
Start: 2016-12-22 — End: 2016-12-23

## 2016-12-22 MED ORDER — HYDROCODONE-ACETAMINOPHEN 5-325 MG PO TABS
ORAL_TABLET | ORAL | Status: AC
  Administered 2016-12-22: 1
  Filled 2016-12-22: qty 2

## 2016-12-22 MED ORDER — CEFAZOLIN SODIUM-DEXTROSE 2-4 GM/100ML-% IV SOLN
2.0000 g | INTRAVENOUS | Status: AC
  Administered 2016-12-22: 2 g via INTRAVENOUS
  Filled 2016-12-22: qty 100

## 2016-12-22 MED ORDER — DEXAMETHASONE SODIUM PHOSPHATE 10 MG/ML IJ SOLN
10.0000 mg | INTRAMUSCULAR | Status: AC
  Administered 2016-12-22: 10 mg via INTRAVENOUS
  Filled 2016-12-22: qty 1

## 2016-12-22 MED ORDER — SODIUM CHLORIDE 0.9 % IR SOLN
Status: DC | PRN
  Administered 2016-12-22: 500 mL

## 2016-12-22 MED ORDER — SUGAMMADEX SODIUM 200 MG/2ML IV SOLN
INTRAVENOUS | Status: DC | PRN
  Administered 2016-12-22: 371.6 mg via INTRAVENOUS

## 2016-12-22 MED ORDER — DIPHENHYDRAMINE HCL 50 MG/ML IJ SOLN
INTRAMUSCULAR | Status: DC | PRN
  Administered 2016-12-22: 25 mg via INTRAVENOUS

## 2016-12-22 MED ORDER — CYCLOBENZAPRINE HCL 10 MG PO TABS
10.0000 mg | ORAL_TABLET | Freq: Two times a day (BID) | ORAL | Status: DC | PRN
  Administered 2016-12-22: 10 mg via ORAL

## 2016-12-22 MED ORDER — FENTANYL CITRATE (PF) 100 MCG/2ML IJ SOLN
INTRAMUSCULAR | Status: AC
  Filled 2016-12-22: qty 2

## 2016-12-22 MED ORDER — TRIAMTERENE-HCTZ 37.5-25 MG PO TABS
1.0000 | ORAL_TABLET | Freq: Every day | ORAL | Status: DC
  Administered 2016-12-23: 1 via ORAL
  Filled 2016-12-22 (×2): qty 1

## 2016-12-22 MED ORDER — MIDAZOLAM HCL 2 MG/2ML IJ SOLN
INTRAMUSCULAR | Status: AC
  Filled 2016-12-22: qty 2

## 2016-12-22 MED ORDER — ZOLPIDEM TARTRATE 5 MG PO TABS
5.0000 mg | ORAL_TABLET | Freq: Every day | ORAL | Status: DC
  Administered 2016-12-22: 5 mg via ORAL
  Filled 2016-12-22: qty 1

## 2016-12-22 MED ORDER — HYDROXYZINE HCL 25 MG PO TABS
25.0000 mg | ORAL_TABLET | Freq: Four times a day (QID) | ORAL | Status: DC | PRN
  Administered 2016-12-22 – 2016-12-23 (×3): 50 mg via ORAL
  Filled 2016-12-22 (×3): qty 2

## 2016-12-22 MED ORDER — ONDANSETRON HCL 4 MG/2ML IJ SOLN: 4.0000 mg | Freq: Four times a day (QID) | INTRAMUSCULAR | Status: DC | PRN

## 2016-12-22 MED ORDER — BUPIVACAINE HCL (PF) 0.25 % IJ SOLN
INTRAMUSCULAR | Status: AC
  Filled 2016-12-22: qty 30

## 2016-12-22 MED ORDER — CYCLOBENZAPRINE HCL 10 MG PO TABS
ORAL_TABLET | ORAL | Status: AC
  Filled 2016-12-22: qty 1

## 2016-12-22 MED ORDER — PHENYLEPHRINE HCL 10 MG/ML IJ SOLN
INTRAVENOUS | Status: DC | PRN
  Administered 2016-12-22: 50 ug/min via INTRAVENOUS

## 2016-12-22 MED ORDER — DIPHENHYDRAMINE HCL 25 MG PO CAPS: 25.0000 mg | ORAL_CAPSULE | Freq: Four times a day (QID) | ORAL | Status: DC | PRN

## 2016-12-22 MED ORDER — HYDROMORPHONE HCL 1 MG/ML IJ SOLN: 0.5000 mg | INTRAMUSCULAR | Status: DC | PRN

## 2016-12-22 MED ORDER — FENTANYL CITRATE (PF) 100 MCG/2ML IJ SOLN
50.0000 ug | Freq: Once | INTRAMUSCULAR | Status: AC
  Administered 2016-12-22: 50 ug via INTRAVENOUS

## 2016-12-22 MED ORDER — SODIUM CHLORIDE 0.9% FLUSH
3.0000 mL | Freq: Two times a day (BID) | INTRAVENOUS | Status: DC
  Administered 2016-12-22: 3 mL via INTRAVENOUS

## 2016-12-22 MED ORDER — SODIUM CHLORIDE 0.9 % IR SOLN
Status: DC | PRN
  Administered 2016-12-22: 1000 mL

## 2016-12-22 MED ORDER — FENTANYL CITRATE (PF) 100 MCG/2ML IJ SOLN
25.0000 ug | INTRAMUSCULAR | Status: DC | PRN
  Administered 2016-12-22 (×3): 50 ug via INTRAVENOUS

## 2016-12-22 MED ORDER — FLUTICASONE PROPIONATE 50 MCG/ACT NA SUSP
2.0000 | Freq: Every day | NASAL | Status: DC
Start: 2016-12-23 — End: 2016-12-23
  Administered 2016-12-23: 2 via NASAL
  Filled 2016-12-22: qty 16

## 2016-12-22 MED ORDER — CEFAZOLIN SODIUM-DEXTROSE 2-3 GM-% IV SOLR
INTRAVENOUS | Status: DC | PRN
  Administered 2016-12-22: 2 g via INTRAVENOUS

## 2016-12-22 MED ORDER — KETOROLAC TROMETHAMINE 30 MG/ML IJ SOLN
INTRAMUSCULAR | Status: AC
  Filled 2016-12-22: qty 1

## 2016-12-22 MED ORDER — HYDROXYZINE HCL 25 MG PO TABS: 50.0000 mg | ORAL_TABLET | ORAL | Status: DC | PRN

## 2016-12-22 MED ORDER — PROPOFOL 10 MG/ML IV BOLUS
INTRAVENOUS | Status: DC | PRN
  Administered 2016-12-22: 150 mg via INTRAVENOUS

## 2016-12-22 MED ORDER — SODIUM CHLORIDE 0.9 % IV SOLN: 250.0000 mL | INTRAVENOUS | Status: DC

## 2016-12-22 MED ORDER — SUGAMMADEX SODIUM 200 MG/2ML IV SOLN
INTRAVENOUS | Status: AC
  Filled 2016-12-22: qty 4

## 2016-12-22 MED ORDER — MOMETASONE FURO-FORMOTEROL FUM 200-5 MCG/ACT IN AERO
2.0000 | INHALATION_SPRAY | Freq: Two times a day (BID) | RESPIRATORY_TRACT | Status: DC
  Administered 2016-12-22 – 2016-12-23 (×2): 2 via RESPIRATORY_TRACT
  Filled 2016-12-22 (×2): qty 8.8

## 2016-12-22 MED ORDER — KETOROLAC TROMETHAMINE 15 MG/ML IJ SOLN
30.0000 mg | Freq: Four times a day (QID) | INTRAMUSCULAR | Status: AC
  Administered 2016-12-22 – 2016-12-23 (×3): 30 mg via INTRAVENOUS
  Filled 2016-12-22 (×2): qty 2

## 2016-12-22 MED ORDER — FENTANYL CITRATE (PF) 250 MCG/5ML IJ SOLN
INTRAMUSCULAR | Status: AC
  Filled 2016-12-22: qty 5

## 2016-12-22 MED ORDER — ESTROGENS CONJUGATED 0.3 MG PO TABS
0.3000 mg | ORAL_TABLET | Freq: Every day | ORAL | Status: DC
  Administered 2016-12-23: 0.3 mg via ORAL
  Filled 2016-12-22: qty 1

## 2016-12-22 MED ORDER — ROCURONIUM BROMIDE 100 MG/10ML IV SOLN
INTRAVENOUS | Status: DC | PRN
  Administered 2016-12-22: 50 mg via INTRAVENOUS

## 2016-12-22 MED ORDER — PROPOFOL 10 MG/ML IV BOLUS
INTRAVENOUS | Status: AC
  Filled 2016-12-22: qty 20

## 2016-12-22 MED ORDER — ONDANSETRON HCL 4 MG PO TABS: 4.0000 mg | ORAL_TABLET | Freq: Four times a day (QID) | ORAL | Status: DC | PRN

## 2016-12-22 MED ORDER — FENTANYL CITRATE (PF) 100 MCG/2ML IJ SOLN
INTRAMUSCULAR | Status: DC | PRN
  Administered 2016-12-22: 50 ug via INTRAVENOUS
  Administered 2016-12-22 (×2): 100 ug via INTRAVENOUS

## 2016-12-22 MED ORDER — FENTANYL CITRATE (PF) 100 MCG/2ML IJ SOLN
50.0000 ug | Freq: Once | INTRAMUSCULAR | Status: AC
Start: 2016-12-22 — End: 2016-12-22
  Administered 2016-12-22: 50 ug via INTRAVENOUS
  Filled 2016-12-22: qty 1

## 2016-12-22 MED ORDER — LIDOCAINE HCL (CARDIAC) 20 MG/ML IV SOLN
INTRAVENOUS | Status: DC | PRN
  Administered 2016-12-22: 60 mg via INTRAVENOUS

## 2016-12-22 MED ORDER — ALBUTEROL SULFATE (2.5 MG/3ML) 0.083% IN NEBU
2.5000 mg | INHALATION_SOLUTION | RESPIRATORY_TRACT | Status: DC | PRN
Start: 2016-12-22 — End: 2016-12-23

## 2016-12-22 MED ORDER — LACTATED RINGERS IV SOLN
INTRAVENOUS | Status: DC
  Administered 2016-12-22 (×3): via INTRAVENOUS

## 2016-12-22 MED ORDER — IPRATROPIUM-ALBUTEROL 0.5-2.5 (3) MG/3ML IN SOLN: 3.0000 mL | RESPIRATORY_TRACT | Status: DC | PRN

## 2016-12-22 MED ORDER — SODIUM CHLORIDE 0.9% FLUSH: 3.0000 mL | INTRAVENOUS | Status: DC | PRN

## 2016-12-22 MED ORDER — LOSARTAN POTASSIUM 50 MG PO TABS
50.0000 mg | ORAL_TABLET | Freq: Every day | ORAL | Status: DC
  Administered 2016-12-23: 50 mg via ORAL
  Filled 2016-12-22: qty 1

## 2016-12-22 MED ORDER — PHENYLEPHRINE 40 MCG/ML (10ML) SYRINGE FOR IV PUSH (FOR BLOOD PRESSURE SUPPORT)
PREFILLED_SYRINGE | INTRAVENOUS | Status: AC
  Filled 2016-12-22: qty 10

## 2016-12-22 MED ORDER — PHENOL 1.4 % MT LIQD: 1.0000 | OROMUCOSAL | Status: DC | PRN

## 2016-12-22 MED ORDER — CEFAZOLIN IN D5W 1 GM/50ML IV SOLN
1.0000 g | Freq: Three times a day (TID) | INTRAVENOUS | Status: AC
  Administered 2016-12-22 – 2016-12-23 (×2): 1 g via INTRAVENOUS
  Filled 2016-12-22: qty 50

## 2016-12-22 MED ORDER — HYDROCODONE-ACETAMINOPHEN 5-325 MG PO TABS
1.0000 | ORAL_TABLET | ORAL | Status: DC | PRN
  Administered 2016-12-22 – 2016-12-23 (×4): 2 via ORAL
  Filled 2016-12-22 (×3): qty 2

## 2016-12-22 MED ORDER — ARTIFICIAL TEARS OP OINT
TOPICAL_OINTMENT | OPHTHALMIC | Status: DC | PRN
  Administered 2016-12-22: 1 via OPHTHALMIC

## 2016-12-22 MED ORDER — MENTHOL 3 MG MT LOZG: 1.0000 | LOZENGE | OROMUCOSAL | Status: DC | PRN

## 2016-12-22 MED ORDER — IBUPROFEN 800 MG PO TABS
800.0000 mg | ORAL_TABLET | Freq: Three times a day (TID) | ORAL | Status: DC | PRN
  Filled 2016-12-22: qty 1

## 2016-12-22 MED ORDER — CYCLOBENZAPRINE HCL 10 MG PO TABS
10.0000 mg | ORAL_TABLET | Freq: Three times a day (TID) | ORAL | Status: DC | PRN
  Administered 2016-12-23: 10 mg via ORAL
  Filled 2016-12-22: qty 1

## 2016-12-22 MED ORDER — MIDAZOLAM HCL 5 MG/5ML IJ SOLN
INTRAMUSCULAR | Status: DC | PRN
  Administered 2016-12-22: 2 mg via INTRAVENOUS

## 2016-12-22 MED ORDER — ONDANSETRON HCL 4 MG/2ML IJ SOLN
INTRAMUSCULAR | Status: DC | PRN
Start: 2016-12-22 — End: 2016-12-22
  Administered 2016-12-22: 4 mg via INTRAVENOUS

## 2016-12-22 MED ORDER — SUGAMMADEX SODIUM 500 MG/5ML IV SOLN
INTRAVENOUS | Status: AC
  Filled 2016-12-22: qty 5

## 2016-12-22 MED ORDER — BUPIVACAINE HCL (PF) 0.25 % IJ SOLN
INTRAMUSCULAR | Status: DC | PRN
  Administered 2016-12-22: 20 mL

## 2016-12-22 MED ORDER — PHENYLEPHRINE HCL 10 MG/ML IJ SOLN
INTRAMUSCULAR | Status: DC | PRN
  Administered 2016-12-22 (×3): 80 ug via INTRAVENOUS

## 2016-12-22 MED ORDER — DULOXETINE HCL 60 MG PO CPEP
60.0000 mg | ORAL_CAPSULE | Freq: Every day | ORAL | Status: DC
  Administered 2016-12-22: 60 mg via ORAL
  Filled 2016-12-22: qty 2
  Filled 2016-12-22: qty 1

## 2016-12-22 SURGICAL SUPPLY — 51 items
BAG DECANTER FOR FLEXI CONT (MISCELLANEOUS) IMPLANT
BENZOIN TINCTURE PRP APPL 2/3 (GAUZE/BANDAGES/DRESSINGS) IMPLANT
BLADE CLIPPER SURG (BLADE) IMPLANT
BUR CUTTER 7.0 ROUND (BURR) IMPLANT
CANISTER SUCT 3000ML PPV (MISCELLANEOUS) ×2 IMPLANT
CARTRIDGE OIL MAESTRO DRILL (MISCELLANEOUS) IMPLANT
DECANTER SPIKE VIAL GLASS SM (MISCELLANEOUS) ×2 IMPLANT
DERMABOND ADVANCED (GAUZE/BANDAGES/DRESSINGS) ×1
DERMABOND ADVANCED .7 DNX12 (GAUZE/BANDAGES/DRESSINGS) ×1 IMPLANT
DIFFUSER DRILL AIR PNEUMATIC (MISCELLANEOUS) IMPLANT
DRAPE HALF SHEET 40X57 (DRAPES) IMPLANT
DRAPE LAPAROTOMY 100X72X124 (DRAPES) ×2 IMPLANT
DRAPE MICROSCOPE LEICA (MISCELLANEOUS) IMPLANT
DRAPE POUCH INSTRU U-SHP 10X18 (DRAPES) ×2 IMPLANT
DRAPE SURG 17X23 STRL (DRAPES) ×4 IMPLANT
DRSG OPSITE POSTOP 4X6 (GAUZE/BANDAGES/DRESSINGS) ×2 IMPLANT
ELECT BLADE 4.0 EZ CLEAN MEGAD (MISCELLANEOUS) ×2
ELECT REM PT RETURN 9FT ADLT (ELECTROSURGICAL) ×2
ELECTRODE BLDE 4.0 EZ CLN MEGD (MISCELLANEOUS) ×1 IMPLANT
ELECTRODE REM PT RTRN 9FT ADLT (ELECTROSURGICAL) ×1 IMPLANT
GAUZE SPONGE 4X4 12PLY STRL (GAUZE/BANDAGES/DRESSINGS) IMPLANT
GAUZE SPONGE 4X4 16PLY XRAY LF (GAUZE/BANDAGES/DRESSINGS) IMPLANT
GLOVE BIOGEL PI IND STRL 6.5 (GLOVE) ×2 IMPLANT
GLOVE BIOGEL PI INDICATOR 6.5 (GLOVE) ×2
GLOVE ECLIPSE 9.0 STRL (GLOVE) IMPLANT
GLOVE EXAM NITRILE LRG STRL (GLOVE) IMPLANT
GLOVE EXAM NITRILE XL STR (GLOVE) IMPLANT
GLOVE EXAM NITRILE XS STR PU (GLOVE) IMPLANT
GLOVE SS PI 9.0 STRL (GLOVE) ×2 IMPLANT
GLOVE SURG SS PI 7.0 STRL IVOR (GLOVE) ×2 IMPLANT
GOWN STRL REUS W/ TWL LRG LVL3 (GOWN DISPOSABLE) IMPLANT
GOWN STRL REUS W/ TWL XL LVL3 (GOWN DISPOSABLE) ×1 IMPLANT
GOWN STRL REUS W/TWL 2XL LVL3 (GOWN DISPOSABLE) IMPLANT
GOWN STRL REUS W/TWL LRG LVL3 (GOWN DISPOSABLE)
GOWN STRL REUS W/TWL XL LVL3 (GOWN DISPOSABLE) ×1
KIT BASIN OR (CUSTOM PROCEDURE TRAY) ×2 IMPLANT
KIT ROOM TURNOVER OR (KITS) ×2 IMPLANT
NEEDLE HYPO 22GX1.5 SAFETY (NEEDLE) ×2 IMPLANT
NEEDLE SPNL 22GX3.5 QUINCKE BK (NEEDLE) IMPLANT
NS IRRIG 1000ML POUR BTL (IV SOLUTION) ×2 IMPLANT
OIL CARTRIDGE MAESTRO DRILL (MISCELLANEOUS)
PACK LAMINECTOMY NEURO (CUSTOM PROCEDURE TRAY) ×2 IMPLANT
PAD ARMBOARD 7.5X6 YLW CONV (MISCELLANEOUS) ×4 IMPLANT
RUBBERBAND STERILE (MISCELLANEOUS) ×2 IMPLANT
SPONGE SURGIFOAM ABS GEL 100 (HEMOSTASIS) IMPLANT
STRIP CLOSURE SKIN 1/2X4 (GAUZE/BANDAGES/DRESSINGS) IMPLANT
SUT VIC AB 2-0 CT1 18 (SUTURE) ×2 IMPLANT
SUT VIC AB 3-0 SH 8-18 (SUTURE) ×2 IMPLANT
TOWEL GREEN STERILE (TOWEL DISPOSABLE) ×2 IMPLANT
TOWEL GREEN STERILE FF (TOWEL DISPOSABLE) IMPLANT
WATER STERILE IRR 1000ML POUR (IV SOLUTION) ×2 IMPLANT

## 2016-12-22 NOTE — Op Note (Signed)
Date of procedure: 12/22/2013  Date of dictation: Same  Service: Neurosurgery  Preoperative diagnosis: L4-5 painful instrumentation  Postoperative diagnosis: Same  Procedure Name: Reexploration of posterior lateral fusion with removal of painful pedicle screw instrumentation  Surgeon:Sylva Overley A.Miki Blank, M.D.  Asst. Surgeon: None  Anesthesia: General  Indication: 51 year old female status post prior L4-5 decompression and fusion. Patient has done well overall but has intermittent sharp stabbing pains into her groin and lower extremity thought to be secondary to irritation of her psoas muscle and lumbar plexus secondary to laterally position screws into the body of L5. Fusion appears solid radiographically. Patient presents now for removal of painful instrumentation  Operative note: After induction anesthesia, patient position prone onto Wilson frame and appropriate padded. Lumbar region prepped and draped sterilely. Incision made medially overlying the L4-5 instrumentation bilaterally. Dissection was carried down to the screw heads at L4 and L5. The pedicle screw station was disassembled. Rod was removed. The fusion was inspected and found to be solid. The screws at L4 and L5 were removed bilaterally. Wounds were irrigated. Wounds are then closed in a typical fashion. No apparent complications. Patient tolerated procedure well and she returns to the recovery room postop.

## 2016-12-22 NOTE — Anesthesia Postprocedure Evaluation (Signed)
Anesthesia Post Note  Patient: Melissa Allen  Procedure(s) Performed: Procedure(s) (LRB): Lumbar Four-Five Removal of Hardware (N/A)  Patient location during evaluation: PACU Anesthesia Type: General Level of consciousness: awake and alert Pain management: pain level controlled Vital Signs Assessment: post-procedure vital signs reviewed and stable Respiratory status: spontaneous breathing, nonlabored ventilation, respiratory function stable and patient connected to nasal cannula oxygen Cardiovascular status: blood pressure returned to baseline and stable Postop Assessment: no signs of nausea or vomiting Anesthetic complications: no       Last Vitals:  Vitals:   12/22/16 1440 12/22/16 1455  BP: 139/88 129/71  Pulse: 99 100  Resp: 15 13  Temp:      Last Pain:  Vitals:   12/22/16 1500  TempSrc:   PainSc: Asleep                 Nafisah Runions,W. EDMOND

## 2016-12-22 NOTE — Progress Notes (Signed)
Patient complaining of 8/10 back pain.  Notified Dr. Ola Spurr and received verbal order to give 50-100 mcg Fentanyl.

## 2016-12-22 NOTE — Brief Op Note (Signed)
12/22/2016  1:40 PM  PATIENT:  Melissa Allen  51 y.o. female  PRE-OPERATIVE DIAGNOSIS:  Radiculopathy, lumbar region  POST-OPERATIVE DIAGNOSIS:  Radiculopathy, lumbar region  PROCEDURE:  Procedure(s): Lumbar Four-Five Removal of Hardware (N/A)  SURGEON:  Surgeon(s) and Role:    * Earnie Larsson, MD - Primary  PHYSICIAN ASSISTANT:   ASSISTANTS:    ANESTHESIA:   general  EBL:  Total I/O In: 1000 [I.V.:1000] Out: -   BLOOD ADMINISTERED:none  DRAINS: none   LOCAL MEDICATIONS USED:  MARCAINE     SPECIMEN:  No Specimen  DISPOSITION OF SPECIMEN:  N/A  COUNTS:  YES  TOURNIQUET:  * No tourniquets in log *  DICTATION: .Dragon Dictation  PLAN OF CARE: Admit for overnight observation  PATIENT DISPOSITION:  PACU - hemodynamically stable.   Delay start of Pharmacological VTE agent (>24hrs) due to surgical blood loss or risk of bleeding: yes

## 2016-12-22 NOTE — Anesthesia Procedure Notes (Signed)
Procedure Name: Intubation Date/Time: 12/22/2016 12:38 PM Performed by: Neldon Newport Pre-anesthesia Checklist: Timeout performed, Patient being monitored, Suction available, Emergency Drugs available and Patient identified Patient Re-evaluated:Patient Re-evaluated prior to inductionOxygen Delivery Method: Circle system utilized Preoxygenation: Pre-oxygenation with 100% oxygen Intubation Type: IV induction Ventilation: Mask ventilation without difficulty Laryngoscope Size: Glidescope and 3 Grade View: Grade I Tube type: Oral Tube size: 7.0 mm Number of attempts: 1 Placement Confirmation: breath sounds checked- equal and bilateral,  positive ETCO2 and ETT inserted through vocal cords under direct vision Secured at: 21 cm Tube secured with: Tape Dental Injury: Teeth and Oropharynx as per pre-operative assessment

## 2016-12-22 NOTE — Anesthesia Preprocedure Evaluation (Addendum)
Anesthesia Evaluation  Patient identified by MRN, date of birth, ID band Patient awake    Reviewed: Allergy & Precautions, H&P , NPO status , Patient's Chart, lab work & pertinent test results  History of Anesthesia Complications (+) PONV and history of anesthetic complications  Airway Mallampati: III  TM Distance: >3 FB Neck ROM: Full    Dental no notable dental hx. (+) Teeth Intact, Dental Advisory Given   Pulmonary shortness of breath and with exertion, asthma ,    Pulmonary exam normal breath sounds clear to auscultation       Cardiovascular hypertension, Pt. on medications + Peripheral Vascular Disease   Rhythm:Regular Rate:Normal     Neuro/Psych  Headaches, Seizures -, Well Controlled,  Anxiety Depression    GI/Hepatic Neg liver ROS, GERD  ,  Endo/Other  negative endocrine ROS  Renal/GU Renal disease  negative genitourinary   Musculoskeletal  (+) Arthritis , Osteoarthritis,  Fibromyalgia -  Abdominal   Peds  Hematology negative hematology ROS (+)   Anesthesia Other Findings   Reproductive/Obstetrics negative OB ROS                           Anesthesia Physical Anesthesia Plan  ASA: III  Anesthesia Plan: General   Post-op Pain Management:    Induction: Intravenous  Airway Management Planned: Oral ETT  Additional Equipment:   Intra-op Plan:   Post-operative Plan: Extubation in OR  Informed Consent: I have reviewed the patients History and Physical, chart, labs and discussed the procedure including the risks, benefits and alternatives for the proposed anesthesia with the patient or authorized representative who has indicated his/her understanding and acceptance.   Dental advisory given and Dental Advisory Given  Plan Discussed with: CRNA and Anesthesiologist  Anesthesia Plan Comments:        Anesthesia Quick Evaluation

## 2016-12-22 NOTE — H&P (Signed)
Melissa Allen is an 51 y.o. female.   Chief Complaint: Back pain HPI: 50 year old female status post prior L4-5 decompression infusion. Patient has been plagued with persistent stabbing back pains and intermittent radicular symptoms. The patient has gone on to fuse solidly L4-5. She does have some anterior lateral ankle screws the L5 level. It is possible that she's having some symptoms secondary to irritation of her psoas R plexus from this although this is certainly unusual. The patient is failed all efforts at conservative management. She presents now for hardware removal in hopes of improving her symptoms.  Past Medical History:  Diagnosis Date  . Allergy   . Anginal pain ([place])    one episode 4-5 years ago-[facility]-nonspecific-no problems since-  . Anxiety   . Arthritis    multiple areas of arthritis- DDD  . Asthma    recent admit to ER 01/11/12 for exac of asthma  . Calcaneal fracture   . Duplicated ureter, right   . Fibromyalgia   . Hypertension   . Kidney stones   . Obesity (BMI 30.0-34.9)   . Pancreatitis 1986  . PONV (postoperative nausea and vomiting)   . Poor venous access    hard to start iv-  . 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 Shortness of breath dyspnea   . Wears glasses     Past Surgical History:  Procedure Laterality Date  . ABDOMINAL HYSTERECTOMY    . BLADDER SUSPENSION  03/01/2012   Procedure: TRANSVAGINAL TAPE (TVT) PROCEDURE;  Surgeon: Emily Filbert, MD;  Location: [facility];  Service: Gynecology;  Laterality: N/A;  . BUNIONECTOMY  1995   Right  . CHOLECYSTECTOMY  late 1980's      . CYSTOSCOPY  03/01/2012   Procedure: CYSTOSCOPY;  Surgeon: Emily Filbert, MD;  Location: [facility];  Service: Gynecology;  Laterality: N/A;  . CYSTOSCOPY W/ URETERAL STENT PLACEMENT Left 12/15/2014   Procedure: CYSTOSCOPY, LEFT URETEROSCOPYU WITH STONE EXTRACTION;  Surgeon: Irine Seal, MD;  Location: [facility];  Service: Urology;  Laterality: Left;  . CYSTOSCOPY W/ URETERAL STENT PLACEMENT Left 10/02/2016   Procedure: CYSTOSCOPY WITH RETROGRADE PYELOGRAM/URETERAL STENT PLACEMENT ureteroscopy, basket extraction;  Surgeon: Cleon Gustin, MD;  Location: [facility];  Service: Urology;  Laterality: Left;  . INCISION AND DRAINAGE ABSCESS Left 07/19/2013   Procedure: INCISION AND DRAINAGE LEFT LOWER EXTERMITY HEMATOMA;  Surgeon: Imogene Burn. Georgette Dover, MD;  Location: [facility];  Service: General;  Laterality: Left;  Excision left lower leg mass  . KNEE SURGERY Left    x 2, Murphy/Sue, corrected Patella displacement   . LAMINECTOMY    . LUMBAR LAMINECTOMY  10/06   L4-5, Ray  . R compressed pronator Right    Sypher, R arm  . SPINAL FUSION    . TENDON REPAIR  2013  . TENDON REPAIR Right 02/01/12  . TONSILLECTOMY  age 6    Family History  Problem Relation Age of Onset  . Cancer Mother     pancreatic cancer  . Heart disease Father 5  . Colon polyps Father   . Colon cancer Paternal Grandfather   . Colon cancer Maternal Grandmother   . Uterine cancer      Grandmother  . Breast cancer      Grandmother  . Ovarian cancer      Grandmother  . Diabetes      mother  .  Other Neg Hx    Social History:  reports that she has never smoked. She has never used smokeless tobacco. She reports that she drinks about 3.5 oz of alcohol per week . She reports that she does not use drugs.  Allergies:  Allergies  Allergen Reactions  . Latex Itching, Swelling and Dermatitis    SWELLING FROM FACE MASK RING AFTER LAST SURGERY   . Lisinopril Cough       . Codeine Nausea And Vomiting  . Dilaudid [Hydromorphone Hcl] Itching       . Hydrocodone Itching  . Tape Rash    TOLERATES PAPER TAPE ONLY NO PINK SURGICAL TAPE EITHER    Medications Prior to Admission  Medication Sig Dispense Refill  . albuterol (PROAIR HFA) 108 (90 Base) MCG/ACT inhaler INHALE TWO PUFFS EVERY FOUR HOURS AS NEEDED SHORTNESS  OF BREATH 8.5 g 2  . ALPRAZolam (XANAX) 0.25 MG tablet TAKE ONE TO TWO TABLET BY MOUTH DAILY. (Patient taking differently: TAKE ONE TO TWO TABLET BY MOUTH DAILY AS NEEDE FOR ANXIETY) 60 tablet 0  . diphenhydrAMINE (BENADRYL) 25 MG tablet Take 25 mg by mouth every 6 (six) hours as needed for itching.    . DULoxetine (CYMBALTA) 60 MG capsule TAKE ONE CAPSULE EACH NIGHT AT BEDTIME * (Patient taking differently: Take 60 mg by mouth at bedtime. ) 90 capsule 3  . fluticasone (FLONASE) 50 MCG/ACT nasal spray USE 2 SPRAYS INTO EACH NOSTRIL ONCE DAILY AS DIRECTED. (Patient taking differently: USE 2 SPRAYS INTO EACH NOSTRIL ONCE DAILY IN THE MORNING) 16 g 5  . ibuprofen (ADVIL,MOTRIN) 200 MG tablet Take 800 mg by mouth every 8 (eight) hours as needed (for pain/headaches.).    Marland Kitchen losartan (COZAAR) 50 MG tablet TAKE 1 TABLET BY MOUTH DAILY 90 tablet 1  . mometasone-formoterol (DULERA) 200-5 MCG/ACT AERO Inhale 2 puffs into the lungs 2 (two) times daily. (Patient taking differently: Inhale 2 puffs into the lungs daily. ) 1 Inhaler 3  . PREMARIN 0.3 MG tablet TAKE 1 TABLET BY MOUTH DAILY 90 tablet 0  . tetrahydrozoline-zinc (VISINE-AC) 0.05-0.25 % ophthalmic solution Place 2 drops into both eyes 3 (three) times daily as needed (FOR ALLERGIES).    Marland Kitchen triamterene-hydrochlorothiazide (MAXZIDE-25) 37.5-25 MG tablet TAKE 1 TABLET BY MOUTH DAILY 90 tablet 1  . zolpidem (AMBIEN) 10 MG tablet TAKE ONE TABLET BY MOUTH EVERY NIGHT AT BEDTIME 30 tablet 0  . budesonide-formoterol (SYMBICORT) 160-4.5 MCG/ACT inhaler Inhale 2 puffs into the lungs 2 (two) times daily. (Patient not taking: Reported on 12/10/2016) 1 Inhaler 3  . cyclobenzaprine (FLEXERIL) 10 MG tablet Take 10 mg by mouth 2 (two) times daily as needed for muscle spasms (muscle spasms). Take 1 tablet (10 mg) daily at bedtime, may take an additional tablet during the day as needed for muscle spasms    . ipratropium-albuterol (DUONEB) 0.5-2.5 (3) MG/3ML SOLN Take 3 mLs by  nebulization every 4 (four) hours as needed. (Patient taking differently: Take 3 mLs by nebulization every 4 (four) hours as needed (shortness of breath/wheezing). ) 360 mL 1    No results found for this or any previous visit (from the past 48 hour(s)). No results found.  Pertinent items noted in HPI and remainder of comprehensive ROS otherwise negative.  Blood pressure 133/72, pulse (!) 110, temperature 98.4 F (36.9 C), temperature source Oral, resp. rate 20, last menstrual period 12/21/2011, SpO2 99 %.  Patient is awake and alert. She is oriented and appropriate. Cranial nerve function intact. Motor  and sensory function extremities normal. Wound clean and dry. Chest and abdomen benign. Examination head ears eyes and throat are unremarkable. Assessment/Plan L4-L5 painful instrumentation. Plan reexploration of posterior lateral fusion with removal of hardware. Risks and benefits been explained. Patient wishes to proceed.  Lucy Boardman A 12/22/2016, 12:06 PM

## 2016-12-22 NOTE — Transfer of Care (Signed)
Immediate Anesthesia Transfer of Care Note  Patient: Melissa Allen  Procedure(s) Performed: Procedure(s): Lumbar Four-Five Removal of Hardware (N/A)  Patient Location: PACU  Anesthesia Type:General  Level of Consciousness: awake, alert  and oriented  Airway & Oxygen Therapy: Patient Spontanous Breathing and Patient connected to face mask oxygen  Post-op Assessment: Report given to RN, Post -op Vital signs reviewed and stable and Patient moving all extremities X 4  Post vital signs: Reviewed and stable  Last Vitals:  Vitals:   12/22/16 1048 12/22/16 1355  BP: 133/72   Pulse: (!) 110 (!) 104  Resp: 20 19  Temp: 36.9 C 36.3 C    Last Pain:  Vitals:   12/22/16 1201  TempSrc:   PainSc: 7       Patients Stated Pain Goal: 4 (29/02/11 1552)  Complications: No apparent anesthesia complications

## 2016-12-23 ENCOUNTER — Other Ambulatory Visit: Payer: Self-pay | Admitting: Family Medicine

## 2016-12-23 ENCOUNTER — Encounter (HOSPITAL_COMMUNITY): Payer: Self-pay | Admitting: Neurosurgery

## 2016-12-23 DIAGNOSIS — T8484XA Pain due to internal orthopedic prosthetic devices, implants and grafts, initial encounter: Secondary | ICD-10-CM | POA: Diagnosis not present

## 2016-12-23 MED ORDER — HYDROXYZINE HCL 25 MG PO TABS: 25.0000 mg | ORAL_TABLET | Freq: Four times a day (QID) | ORAL | 0 refills | Status: DC | PRN

## 2016-12-23 MED ORDER — HYDROCODONE-ACETAMINOPHEN 5-325 MG PO TABS: 1.0000 | ORAL_TABLET | ORAL | 0 refills | Status: DC | PRN

## 2016-12-23 NOTE — Progress Notes (Signed)
D/C instructions reviewed with pt and her husband. Copy of instructions and script given to pt. Pt d/c'd via wheelchair with belongings, with husband and escorted by unit RN.

## 2016-12-23 NOTE — Discharge Instructions (Signed)

## 2016-12-23 NOTE — Discharge Summary (Signed)
Physician Discharge Summary  Patient ID: Melissa Allen MRN: [id number] DOB/AGE: [birth year] 51 y.o.  Admit date: 12/22/2016 Discharge date: 12/23/2016  Admission Diagnoses:  Discharge Diagnoses:  Active Problems:   Painful orthopaedic hardware [facility])   Discharged Condition: good  Hospital Course: Patient admitted to the hospital where she underwent reexploration of lumbar fusion with removal of painful instrumentation. Postoperative she is doing well. She is having no wound issues. She is ambulating without difficulty. She is ready for discharge home.  Consults:   Significant Diagnostic Studies:   Treatments:   Discharge Exam: Blood pressure 126/74, pulse 99, temperature 98.6 F (37 C), resp. rate 18, last menstrual period 12/21/2011, SpO2 98 %. Awake and alert. Oriented and appropriate. Cranial nerve function intact. Motor sensory function extremities normal. Wound clean and dry. Chest and abdomen benign.  Disposition: 01-Home or Self Care   Allergies as of 12/23/2016      Reactions   Latex Itching, Swelling, Dermatitis   SWELLING FROM FACE MASK RING AFTER LAST SURGERY   Lisinopril Cough      Codeine Nausea And Vomiting   Dilaudid [hydromorphone Hcl] Itching       Hydrocodone Itching   Tape Rash   TOLERATES PAPER TAPE ONLY NO PINK SURGICAL TAPE EITHER      Medication List    TAKE these medications   albuterol 108 (90 Base) MCG/ACT inhaler Commonly known as:  PROAIR HFA INHALE TWO PUFFS EVERY FOUR HOURS AS NEEDED SHORTNESS OF BREATH   ALPRAZolam 0.25 MG tablet Commonly known as:  XANAX TAKE ONE TO TWO TABLET BY MOUTH DAILY. What changed:  See the new instructions.   budesonide-formoterol 160-4.5 MCG/ACT inhaler Commonly known as:  SYMBICORT Inhale 2 puffs into the lungs 2 (two) times daily.   cyclobenzaprine 10 MG tablet Commonly known as:  FLEXERIL Take 10 mg by mouth 2 (two) times daily as needed for muscle spasms (muscle spasms). Take 1 tablet (10 mg)  daily at bedtime, may take an additional tablet during the day as needed for muscle spasms   diphenhydrAMINE 25 MG tablet Commonly known as:  BENADRYL Take 25 mg by mouth every 6 (six) hours as needed for itching.   DULoxetine 60 MG capsule Commonly known as:  CYMBALTA TAKE ONE CAPSULE EACH NIGHT AT BEDTIME * What changed:  how much to take  how to take this  when to take this  additional instructions   fluticasone 50 MCG/ACT nasal spray Commonly known as:  FLONASE USE 2 SPRAYS INTO EACH NOSTRIL ONCE DAILY AS DIRECTED. What changed:  See the new instructions.   HYDROcodone-acetaminophen 5-325 MG tablet Commonly known as:  NORCO/VICODIN Take 1-2 tablets by mouth every 4 (four) hours as needed (breakthrough pain).   hydrOXYzine 25 MG tablet Commonly known as:  ATARAX/VISTARIL Take 1-2 tablets (25-50 mg total) by mouth every 6 (six) hours as needed for itching.   ibuprofen 200 MG tablet Commonly known as:  ADVIL,MOTRIN Take 800 mg by mouth every 8 (eight) hours as needed (for pain/headaches.).   ipratropium-albuterol 0.5-2.5 (3) MG/3ML Soln Commonly known as:  DUONEB Take 3 mLs by nebulization every 4 (four) hours as needed. What changed:  reasons to take this   losartan 50 MG tablet Commonly known as:  COZAAR TAKE 1 TABLET BY MOUTH DAILY   mometasone-formoterol 200-5 MCG/ACT Aero Commonly known as:  DULERA Inhale 2 puffs into the lungs 2 (two) times daily. What changed:  when to take this   PREMARIN 0.3 MG tablet Generic  drug:  estrogens (conjugated) TAKE 1 TABLET BY MOUTH DAILY   tetrahydrozoline-zinc 0.05-0.25 % ophthalmic solution Commonly known as:  VISINE-AC Place 2 drops into both eyes 3 (three) times daily as needed (FOR ALLERGIES).   triamterene-hydrochlorothiazide 37.5-25 MG tablet Commonly known as:  MAXZIDE-25 TAKE 1 TABLET BY MOUTH DAILY   zolpidem 10 MG tablet Commonly known as:  AMBIEN TAKE ONE TABLET BY MOUTH EVERY NIGHT AT BEDTIME         Signed: Mindi Akerson A 12/23/2016, 7:59 AM

## 2016-12-23 NOTE — Telephone Encounter (Signed)
Left refill on voice mail at pharmacy  

## 2016-12-23 NOTE — Telephone Encounter (Signed)
Last filled 10-09-16 #60 Last OV 10-09-16 No Future OV

## 2016-12-29 ENCOUNTER — Other Ambulatory Visit: Payer: Self-pay | Admitting: Family Medicine

## 2016-12-29 NOTE — Telephone Encounter (Signed)
Last filled 12-01-16 #30 Last OV 10-09-16 No Future OV

## 2016-12-29 NOTE — Telephone Encounter (Signed)
Left refill on voice mail at pharmacy  

## 2017-01-24 ENCOUNTER — Other Ambulatory Visit (HOSPITAL_COMMUNITY)
Admission: RE | Admit: 2017-01-24 | Discharge: 2017-01-24 | Disposition: A | Discharge status: Home / Self Care | Payer: BC Managed Care – PPO | Source: Other Acute Inpatient Hospital | Attending: Family Medicine | Admitting: Family Medicine

## 2017-01-24 ENCOUNTER — Ambulatory Visit (INDEPENDENT_AMBULATORY_CARE_PROVIDER_SITE_OTHER): Payer: BC Managed Care – PPO | Admitting: Family Medicine

## 2017-01-24 ENCOUNTER — Encounter: Payer: Self-pay | Admitting: Family Medicine

## 2017-01-24 VITALS — BP 130/86 | HR 100 | Temp 97.9°F | Wt 202.0 lb

## 2017-01-24 DIAGNOSIS — G8929 Other chronic pain: Secondary | ICD-10-CM

## 2017-01-24 DIAGNOSIS — M545 Low back pain, unspecified: Secondary | ICD-10-CM

## 2017-01-24 DIAGNOSIS — L03317 Cellulitis of buttock: Secondary | ICD-10-CM | POA: Diagnosis not present

## 2017-01-24 LAB — POCT URINALYSIS DIP (MANUAL ENTRY)
BILIRUBIN UA: NEGATIVE mg/dL
Bilirubin, UA: NEGATIVE
Blood, UA: NEGATIVE
Glucose, UA: NEGATIVE mg/dL
LEUKOCYTES UA: NEGATIVE
NITRITE UA: NEGATIVE
PH UA: 6 (ref 5.0–8.0)
PROTEIN UA: NEGATIVE mg/dL
Spec Grav, UA: 1.025 (ref 1.010–1.025)
UROBILINOGEN UA: 0.2 U/dL

## 2017-01-24 MED ORDER — DOXYCYCLINE HYCLATE 100 MG PO CAPS: 100.0000 mg | ORAL_CAPSULE | Freq: Two times a day (BID) | ORAL | 0 refills | Status: DC

## 2017-01-24 NOTE — Patient Instructions (Addendum)
We are going to sent you urine for a culture- I'll be in touch with this result  We are also going to start you on doxycycline twice a day for 10 days- this will take care of an abscess and will also treat for most tick borne illness However if you are not feeling better or if you start feeling worse please be seen at an urgent care or the ER right away

## 2017-01-24 NOTE — Progress Notes (Signed)
[facility] at [facility] [address], [place], [place] [address] [phone]  Date:  01/24/2017   Name:  Melissa Allen   DOB:  [birth year]   MRN:  [id number]  PCP:  Lucille Passy, MD    Chief Complaint: Insect Bite (On left hip. Noticed about 2 weeks ago. )   History of Present Illness:  Melissa Allen is a 51 y.o. very pleasant female patient who presents with the following:  She has noted an area on her right buttock which has been present for about 2 weeks. It is painful, she thinks it may have started with an insect bite.  However she then felt like a hard "core" developed and that she may have had an abscess- nothing ever drained.    She also notes that for the last 2 days she has just not felt well- no fever but she has felt tired, achy, and worn out.  Her back has ached which is not really unusual for her- however she has had nephrolithiasis in the past and would like for [place] to check her urine  No fever noted No cough, no abd sx She has not had this sort of problem in the past   History of degenerative spine disease   Patient Active Problem List   Diagnosis Date Noted  . Painful orthopaedic hardware ([place]) 12/22/2016  . Ureteric stone 10/02/2016  . Hot flashes 01/08/2016  . Spinal stenosis, lumbar region, 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  . Degenerative spondylolisthesis 05/01/2015  . Insomnia 09/30/2013  . HEADACHE 04/26/2010  . FIBROMYALGIA 01/25/2010  . Anxiety state 01/22/2010  . Depression, recurrent ([place]) 01/22/2010  . REFLUX GASTRITIS 01/22/2010  . HEPATIC CYST 01/22/2010  . NEPHROLITHIASIS, HX OF 01/22/2010  . Essential hypertension 01/01/2009  . Palpitations 01/11/2008  . Asthma 11/26/2007  . GERD 11/26/2007  . PANCREATITIS, CHRONIC 11/26/2007  . RENAL CALCULUS 11/26/2007  . ARTHRITIS 11/26/2007  . Allergic rhinitis 11/24/2007    Past Medical History:  Diagnosis Date  . Allergy   . Anginal  pain ([place])    one episode 4-5 years ago-[facility]-nonspecific-no problems since-  . Anxiety   . Arthritis    multiple areas of arthritis- DDD  . Asthma    recent admit to ER 01/11/12 for exac of asthma  . Calcaneal fracture   . Duplicated ureter, right   . Fibromyalgia   . Hypertension   . Kidney stones   . Obesity (BMI 30.0-34.9)   . Pancreatitis 1986  . PONV (postoperative nausea and vomiting)   . Poor venous access    hard to start iv-  . 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 Shortness of breath dyspnea   . Wears glasses     Past Surgical History:  Procedure Laterality Date  . ABDOMINAL HYSTERECTOMY    . BLADDER SUSPENSION  03/01/2012   Procedure: TRANSVAGINAL TAPE (TVT) PROCEDURE;  Surgeon: Emily Filbert, MD;  Location: [facility];  Service: Gynecology;  Laterality: N/A;  . BUNIONECTOMY  1995   Right  . CHOLECYSTECTOMY  late 1980's      . CYSTOSCOPY  03/01/2012   Procedure: CYSTOSCOPY;  Surgeon: Emily Filbert, MD;  Location: [facility];  Service: Gynecology;  Laterality: N/A;  . CYSTOSCOPY W/ URETERAL STENT PLACEMENT Left 12/15/2014   Procedure: CYSTOSCOPY, LEFT URETEROSCOPYU WITH STONE EXTRACTION;  Surgeon: Irine Seal, MD;  Location: [facility];  Service: Urology;  Laterality: Left;  . CYSTOSCOPY W/ URETERAL STENT PLACEMENT Left 10/02/2016   Procedure: CYSTOSCOPY WITH RETROGRADE PYELOGRAM/URETERAL STENT PLACEMENT ureteroscopy, basket extraction;  Surgeon: Cleon Gustin, MD;  Location: [facility];  Service: Urology;  Laterality: Left;  . HARDWARE REMOVAL N/A 12/22/2016   Procedure: Lumbar Four-Five Removal of Hardware;  Surgeon: Earnie Larsson, MD;  Location: [facility];  Service: Neurosurgery;  Laterality: N/A;  . INCISION AND DRAINAGE ABSCESS Left 07/19/2013   Procedure: INCISION AND DRAINAGE LEFT LOWER EXTERMITY HEMATOMA;  Surgeon: Imogene Burn. Georgette Dover, MD;  Location: [facility];  Service: General;   Laterality: Left;  Excision left lower leg mass  . KNEE SURGERY Left    x 2, Murphy/Sue, corrected Patella displacement   . LAMINECTOMY    . LUMBAR LAMINECTOMY  10/06   L4-5, Ray  . R compressed pronator Right    Sypher, R arm  . SPINAL FUSION    . TENDON REPAIR  2013  . TENDON REPAIR Right 02/01/12  . TONSILLECTOMY  age 7    Social History  Substance Use Topics  . Smoking status: Never Smoker  . Smokeless tobacco: Never Used  . Alcohol use 3.5 oz/week    7 Standard drinks or equivalent per week     Comment: Occasional- wine or liquor twice a week    Family History  Problem Relation Age of Onset  . Cancer Mother        pancreatic cancer  . Heart disease Father 74  . Colon polyps Father   . Colon cancer Paternal Grandfather   . Colon cancer Maternal Grandmother   . Uterine cancer Unknown        Grandmother  . Breast cancer Unknown        Grandmother  . Ovarian cancer Unknown        Grandmother  . Diabetes Unknown        mother  . Other Neg Hx     Allergies  Allergen Reactions  . Latex Itching, Swelling and Dermatitis    SWELLING FROM FACE MASK RING AFTER LAST SURGERY   . Lisinopril Cough       . Codeine Nausea And Vomiting  . Dilaudid [Hydromorphone Hcl] Itching       . Hydrocodone Itching  . Tape Rash    TOLERATES PAPER TAPE ONLY NO PINK SURGICAL TAPE EITHER    Medication list has been reviewed and updated.  Current Outpatient Prescriptions on File Prior to Visit  Medication Sig Dispense Refill  . albuterol (PROAIR HFA) 108 (90 Base) MCG/ACT inhaler INHALE TWO PUFFS EVERY FOUR HOURS AS NEEDED SHORTNESS OF BREATH 8.5 g 2  . ALPRAZolam (XANAX) 0.25 MG tablet TAKE ONE TO TWO TABLETS BY MOUTH DAILY 60 tablet 0  . budesonide-formoterol (SYMBICORT) 160-4.5 MCG/ACT inhaler Inhale 2 puffs into the lungs 2 (two) times daily. 1 Inhaler 3  . cyclobenzaprine (FLEXERIL) 10 MG tablet Take 10 mg by mouth 2 (two) times daily as needed for muscle spasms (muscle  spasms). Take 1 tablet (10 mg) daily at bedtime, may take an additional tablet during the day as needed for muscle spasms    . diphenhydrAMINE (BENADRYL) 25 MG tablet Take 25 mg by mouth every 6 (six) hours as needed for itching.    . DULoxetine (CYMBALTA) 60 MG capsule TAKE ONE CAPSULE EACH NIGHT AT BEDTIME * (Patient taking differently: Take 60 mg by mouth at bedtime. ) 90 capsule  3  . fluticasone (FLONASE) 50 MCG/ACT nasal spray USE 2 SPRAYS INTO EACH NOSTRIL ONCE DAILY AS DIRECTED. (Patient taking differently: USE 2 SPRAYS INTO EACH NOSTRIL ONCE DAILY IN THE MORNING) 16 g 5  . HYDROcodone-acetaminophen (NORCO/VICODIN) 5-325 MG tablet Take 1-2 tablets by mouth every 4 (four) hours as needed (breakthrough pain). 50 tablet 0  . hydrOXYzine (ATARAX/VISTARIL) 25 MG tablet Take 1-2 tablets (25-50 mg total) by mouth every 6 (six) hours as needed for itching. 30 tablet 0  . ibuprofen (ADVIL,MOTRIN) 200 MG tablet Take 800 mg by mouth every 8 (eight) hours as needed (for pain/headaches.).    Marland Kitchen ipratropium-albuterol (DUONEB) 0.5-2.5 (3) MG/3ML SOLN Take 3 mLs by nebulization every 4 (four) hours as needed. (Patient taking differently: Take 3 mLs by nebulization every 4 (four) hours as needed (shortness of breath/wheezing). ) 360 mL 1  . losartan (COZAAR) 50 MG tablet TAKE 1 TABLET BY MOUTH DAILY 90 tablet 1  . mometasone-formoterol (DULERA) 200-5 MCG/ACT AERO Inhale 2 puffs into the lungs 2 (two) times daily. (Patient taking differently: Inhale 2 puffs into the lungs daily. ) 1 Inhaler 3  . PREMARIN 0.3 MG tablet TAKE 1 TABLET BY MOUTH DAILY 90 tablet 0  . tetrahydrozoline-zinc (VISINE-AC) 0.05-0.25 % ophthalmic solution Place 2 drops into both eyes 3 (three) times daily as needed (FOR ALLERGIES).    Marland Kitchen triamterene-hydrochlorothiazide (MAXZIDE-25) 37.5-25 MG tablet TAKE 1 TABLET BY MOUTH DAILY 90 tablet 1  . zolpidem (AMBIEN) 10 MG tablet TAKE ONE TABLET BY MOUTH EVERY NIGHT AT BEDTIME 30 tablet 0   No  current facility-administered medications on file prior to visit.     Review of Systems:  As per HPI- otherwise negative.   Physical Examination: Vitals:   01/24/17 0951  BP: 130/86  Pulse: 100  Temp: 97.9 F (36.6 C)   Vitals:   01/24/17 0951  Weight: 202 lb (91.6 kg)   Body mass index is 35.22 kg/m. Ideal Body Weight:    GEN: WDWN, NAD, Non-toxic, A & O x 3, obese, accompanied by her husband today.  Looks well HEENT: Atraumatic, Normocephalic. Neck supple. No masses, No LAD. Ears and Nose: No external deformity. CV: RRR, No M/G/R. No JVD. No thrill. No extra heart sounds. PULM: CTA B, no wheezes, crackles, rhonchi. No retractions. No resp. distress. No accessory muscle use. ABD: S, NT, ND, +BS. No rebound. No HSM. NEURO Normal gait.  PSYCH: Normally interactive. Conversant. Not depressed or anxious appearing.  Calm demeanor.  On her right buttock is a nickel sized skin lesion- appears to be a healing bug bite or small pimple/ boil.  No fluctuance or evidence of active abscess needing to be drained  No rash noted on extremities   Results for orders placed or performed in visit on 01/24/17  POCT urinalysis dipstick  Result Value Ref Range   Color, UA yellow yellow   Clarity, UA clear clear   Glucose, UA negative negative mg/dL   Bilirubin, UA negative negative   Ketones, POC UA negative negative mg/dL   Spec Grav, UA 1.025 1.010 - 1.025   Blood, UA negative negative   pH, UA 6.0 5.0 - 8.0   Protein Ur, POC negative negative mg/dL   Urobilinogen, UA 0.2 0.2 or 1.0 E.U./dL   Nitrite, UA Negative Negative   Leukocytes, UA Negative Negative    Assessment and Plan: Cellulitis of right buttock - Plan: doxycycline (VIBRAMYCIN) 100 MG capsule  Chronic left-sided low back pain without sciatica - Plan:  POCT urinalysis dipstick, Urine culture  Here today with a lesion on her right buttock and malaise.  May simply be a healing insect bite or other small infection.  No  vesicles to suggest HSV.  Given prevelence of ticks and [facility] in our area will start her on doxycyline.  She is in agreement with this plan and will seek care promptly if not getting better in the next 1-2 days  Sooner if worse.    Signed Lamar Blinks, MD

## 2017-01-25 ENCOUNTER — Encounter: Payer: Self-pay | Admitting: Family Medicine

## 2017-01-25 LAB — URINE CULTURE: Culture: NO GROWTH

## 2017-01-26 ENCOUNTER — Other Ambulatory Visit: Payer: Self-pay | Admitting: Family Medicine

## 2017-01-26 NOTE — Telephone Encounter (Signed)
Called in to [place], [place]: [phone]

## 2017-01-26 NOTE — Telephone Encounter (Signed)
Last refill 12/23/16 #60, last OV 10/09/16.

## 2017-02-03 ENCOUNTER — Other Ambulatory Visit: Payer: Self-pay | Admitting: Family Medicine

## 2017-02-04 NOTE — Telephone Encounter (Signed)
Zolpidem called into [facility] [place], [place] - [address] Phone: [phone] 

## 2017-02-04 NOTE — Telephone Encounter (Signed)
Last office visit 10/09/2016.  Last refilled 12/29/2016 for #30 with no refills  Ok to refill?

## 2017-02-05 ENCOUNTER — Ambulatory Visit: Payer: BC Managed Care – PPO | Admitting: Podiatry

## 2017-02-23 ENCOUNTER — Other Ambulatory Visit: Payer: Self-pay | Admitting: Family Medicine

## 2017-02-23 NOTE — Telephone Encounter (Signed)
Pt is requesting refill on alprazolam 0.25mg .  Last OV: 10/09/2016 Last Fill: 01/26/2017 #60 and 0RF UDS: None  Please advise.

## 2017-02-23 NOTE — Telephone Encounter (Signed)
Ok to refill as requested 

## 2017-02-24 NOTE — Telephone Encounter (Signed)
Called in to [facility], [place]: [phone]

## 2017-03-09 ENCOUNTER — Other Ambulatory Visit: Payer: Self-pay | Admitting: Family Medicine

## 2017-03-09 NOTE — Telephone Encounter (Signed)
Last refill 02/04/17 Last OV 10/09/16  Ok to refill?

## 2017-03-10 NOTE — Telephone Encounter (Signed)
Called in to [facility], [place]: [phone]

## 2017-04-13 ENCOUNTER — Other Ambulatory Visit: Payer: Self-pay | Admitting: Family Medicine

## 2017-04-14 ENCOUNTER — Other Ambulatory Visit: Payer: Self-pay | Admitting: Family Medicine

## 2017-04-14 NOTE — Telephone Encounter (Signed)
Rx called to pharmacy as instructed. 

## 2017-04-14 NOTE — Telephone Encounter (Signed)
Received refill electronically for Zolpidem Last refill 02/24/17 #60 Last office visit 01/24/17/acute

## 2017-05-02 ENCOUNTER — Other Ambulatory Visit: Payer: Self-pay | Admitting: Family Medicine

## 2017-05-04 ENCOUNTER — Other Ambulatory Visit: Payer: Self-pay | Admitting: *Deleted

## 2017-05-04 MED ORDER — ALPRAZOLAM 0.25 MG PO TABS: 0.2500 mg | ORAL_TABLET | Freq: Every day | ORAL | 0 refills | Status: DC

## 2017-05-15 ENCOUNTER — Other Ambulatory Visit: Payer: Self-pay | Admitting: Family Medicine

## 2017-05-18 NOTE — Telephone Encounter (Signed)
Rx request for Ambien 10 mg Last refilled 04/14/2017 Last OV 10/09/2016 No new appt  Please advise

## 2017-05-18 NOTE — Telephone Encounter (Signed)
rx faxed to pharmacy

## 2017-06-05 ENCOUNTER — Other Ambulatory Visit: Payer: Self-pay | Admitting: Family Medicine

## 2017-06-05 NOTE — Telephone Encounter (Signed)
Last refill 05/04/17 #60  Last OV 10/09/16 Ok to refill?

## 2017-06-05 NOTE — Telephone Encounter (Signed)
Called in [place], [place]: [phone]

## 2017-06-10 ENCOUNTER — Other Ambulatory Visit: Payer: Self-pay | Admitting: Family Medicine

## 2017-06-10 NOTE — Telephone Encounter (Signed)
Last office visit 10/09/2016.  Last refilled 05/18/2017 for #30 with no refills.  Ok to refill?

## 2017-06-10 NOTE — Telephone Encounter (Signed)
Zolpidem called into [facility] - [place], [place] - [address] Phone: [phone] 

## 2017-06-24 ENCOUNTER — Other Ambulatory Visit: Payer: Self-pay | Admitting: Orthopedic Surgery

## 2017-06-24 DIAGNOSIS — M25511 Pain in right shoulder: Secondary | ICD-10-CM

## 2017-06-25 ENCOUNTER — Ambulatory Visit
Admission: RE | Admit: 2017-06-25 | Discharge: 2017-06-25 | Disposition: A | Discharge status: Home / Self Care | Payer: BC Managed Care – PPO | Source: Ambulatory Visit | Attending: Orthopedic Surgery | Admitting: Orthopedic Surgery

## 2017-06-25 DIAGNOSIS — M25511 Pain in right shoulder: Secondary | ICD-10-CM

## 2017-06-29 ENCOUNTER — Encounter (HOSPITAL_COMMUNITY): Payer: Self-pay | Admitting: *Deleted

## 2017-06-29 NOTE — Progress Notes (Signed)
Pt denies SOB, chest pain, and being under the care of a cardiologist. Pt denies having a stress test and cardiac cath. Pt denies having a chest x ray and EKG within the last year. Pt denies recent labs. Pt made aware to stop taking  Aspirin, vitamins, fish oil and herbal medications. Do not take any NSAIDs ie: Ibuprofen, Advil, Naproxen (Aleve), Motrin, BC and Goody Powder or any medication containing Aspirin. Pt verbalized understanding of all pre-op instructions.

## 2017-06-29 NOTE — H&P (Signed)
Melissa Allen is an 51 y.o. female.  Chief Complaint: Right shoulder pain HPI: Melissa Allen is about a month from her knee arthroscopy.  She says that is doing very well.  Unfortunately she has disabling right shoulder pain.  She saw Dr. Tamera Punt and Andee Poles a few weeks back and had an injection which did not help significantly.  She called in and [facility] set her up with an MRI scan.  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  She has terrible pain reaching up and out.  She cannot sleep at night.  She says she is in agony.  MRI:  I reviewed an MRI scan films and report of a study done at [facility] on 06/25/17.  She has severe tendinopathy of the biceps along with severe tendinopathy of the cuff but no full-thickness tear.  She also has some severe degenerative change at the [facility] joint.  Past Medical History:  Diagnosis Date  . Allergy   . Anginal pain ([place])    one episode 4-5 years ago-[facility]-nonspecific-no problems since-  . Anxiety   . Arthritis    multiple areas of arthritis- DDD  . Asthma    recent admit to ER 01/11/12 for exac of asthma  . Calcaneal fracture   . Duplicated ureter, right   . Fibromyalgia   . Hypertension   . Kidney stones   . Obesity (BMI 30.0-34.9)   . Pancreatitis 1986  . PONV (postoperative nausea and vomiting)   . Poor venous access    hard to start iv-  . 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 Shortness of breath dyspnea   . Wears glasses     Past Surgical History:  Procedure Laterality Date  . ABDOMINAL HYSTERECTOMY    . BLADDER SUSPENSION  03/01/2012   Procedure: TRANSVAGINAL TAPE (TVT) PROCEDURE;  Surgeon: Emily Filbert, MD;  Location: [facility];  Service: Gynecology;  Laterality: N/A;  . BUNIONECTOMY  1995   Right  . CHOLECYSTECTOMY  late 1980's      . CYSTOSCOPY  03/01/2012   Procedure: CYSTOSCOPY;  Surgeon: Emily Filbert, MD;  Location:  [facility];  Service: Gynecology;  Laterality: N/A;  . CYSTOSCOPY W/ URETERAL STENT PLACEMENT Left 12/15/2014   Procedure: CYSTOSCOPY, LEFT URETEROSCOPYU WITH STONE EXTRACTION;  Surgeon: Irine Seal, MD;  [facility];  Service: Urology;  Laterality: Left;  . CYSTOSCOPY W/ URETERAL STENT PLACEMENT Left 10/02/2016   Procedure: CYSTOSCOPY WITH RETROGRADE PYELOGRAM/URETERAL STENT PLACEMENT ureteroscopy, basket extraction;  Surgeon: Cleon Gustin, MD;  [facility];  Service: Urology;  Laterality: Left;  . HARDWARE REMOVAL N/A 12/22/2016   Procedure: Lumbar Four-Five Removal of Hardware;  Surgeon: Earnie Larsson, MD;  Location: [facility];  Service: Neurosurgery;  Laterality: N/A;  . INCISION AND DRAINAGE ABSCESS Left 07/19/2013   Procedure: INCISION AND DRAINAGE LEFT LOWER EXTERMITY HEMATOMA;  Surgeon: Imogene Burn. Georgette Dover, MD;  Location: [facility];  Service: General;  Laterality: Left;  Excision left lower leg mass  . KNEE CARTILAGE SURGERY     left  . KNEE SURGERY Left    x 2, Murphy/Sue, corrected Patella displacement   . LAMINECTOMY    . LUMBAR LAMINECTOMY  10/06   L4-5, Ray  . R compressed pronator Right    Sypher, R arm  . SPINAL FUSION    . TENDON REPAIR  2013  . TENDON REPAIR Right 02/01/12  . TONSILLECTOMY  age 71    Family History  Problem Relation Age of Onset  . Cancer Mother        pancreatic cancer  . Heart disease Father 54  . Colon polyps Father   . Colon cancer Paternal Grandfather   . Colon cancer Maternal Grandmother   . Uterine cancer Unknown        Grandmother  . Breast cancer Unknown        Grandmother  . Ovarian cancer Unknown        Grandmother  . Diabetes Unknown        mother  . Other Neg Hx    Social History:  reports that she has never smoked. She has never used smokeless tobacco. She reports that she drinks about 3.5 oz of alcohol per week . She reports that she does not use drugs.  Allergies:  Allergies  Allergen Reactions  . Latex  Itching, Swelling and Dermatitis    SWELLING FROM FACE MASK RING AFTER LAST SURGERY   . Lisinopril Cough       . Codeine Nausea And Vomiting  . Dilaudid [Hydromorphone Hcl] Itching       . Hydrocodone Itching  . Tape Rash    TOLERATES PAPER TAPE ONLY NO PINK SURGICAL TAPE EITHER    No prescriptions prior to admission.    No results found for this or any previous visit (from the past 48 hour(s)). No results found.  Review of Systems  Musculoskeletal: Positive for joint pain.       Right shoulder  All other systems reviewed and are negative.   Last menstrual period 12/21/2011. Physical Exam  Constitutional: She is oriented to person, place, and time. She appears well-developed and well-nourished.  HENT:  Head: Normocephalic and atraumatic.  Eyes: Pupils are equal, round, and reactive to light.  Neck: Normal range of motion.  Cardiovascular: Normal rate and regular rhythm.   Respiratory: Effort normal.  GI: Soft.  Musculoskeletal:  Right shoulder motion is limited.  She can forward flex actively to about 90.  She does not like to really rotate but once she relaxes I can rotate her out easily to 45 equal to the opposite side.  She does have pain over her AC joint.  She has pain in positions of impingement.  Sensation and motor function are intact in her hands with palpable pulses on both sides.   Neurological: She is alert and oriented to person, place, and time.  Skin: Skin is warm and dry.  Psychiatric: She has a normal mood and affect. Her behavior is normal. Judgment and thought content normal.    Assessment/Plan Assessment:  Right shoulder impingement, biceps pain, and AC pain with MRI 2018 injected 06/10/17  Plan: Melissa Allen has disabling pain.  She would like something done.  I think we can help her with an arthroscopy of the shoulder.  I reviewed risk of anesthesia and infection related to a procedure where the plan will be to perform a conservative acromioplasty, formal  AC resection, and probable tenolysis of the biceps.   Avenir Lozinski, Larwance Sachs, PA-C 06/29/2017, 6:28 PM

## 2017-06-30 ENCOUNTER — Ambulatory Visit (HOSPITAL_COMMUNITY): Payer: BC Managed Care – PPO | Admitting: Anesthesiology

## 2017-06-30 ENCOUNTER — Encounter (HOSPITAL_COMMUNITY): Payer: Self-pay | Admitting: *Deleted

## 2017-06-30 ENCOUNTER — Ambulatory Visit (HOSPITAL_COMMUNITY)
Admission: RE | Admit: 2017-06-30 | Discharge: 2017-06-30 | Disposition: A | Discharge status: Home / Self Care | Payer: BC Managed Care – PPO | Source: Ambulatory Visit | Attending: Orthopaedic Surgery | Admitting: Orthopaedic Surgery

## 2017-06-30 ENCOUNTER — Encounter (HOSPITAL_COMMUNITY)
Admission: RE | Disposition: A | Discharge status: Home / Self Care | Payer: Self-pay | Source: Ambulatory Visit | Attending: Orthopaedic Surgery

## 2017-06-30 DIAGNOSIS — J45909 Unspecified asthma, uncomplicated: Secondary | ICD-10-CM | POA: Diagnosis not present

## 2017-06-30 DIAGNOSIS — Z87442 Personal history of urinary calculi: Secondary | ICD-10-CM | POA: Diagnosis not present

## 2017-06-30 DIAGNOSIS — F419 Anxiety disorder, unspecified: Secondary | ICD-10-CM | POA: Insufficient documentation

## 2017-06-30 DIAGNOSIS — Z6834 Body mass index (BMI) 34.0-34.9, adult: Secondary | ICD-10-CM | POA: Insufficient documentation

## 2017-06-30 DIAGNOSIS — K859 Acute pancreatitis without necrosis or infection, unspecified: Secondary | ICD-10-CM | POA: Diagnosis not present

## 2017-06-30 DIAGNOSIS — I10 Essential (primary) hypertension: Secondary | ICD-10-CM | POA: Diagnosis not present

## 2017-06-30 DIAGNOSIS — M797 Fibromyalgia: Secondary | ICD-10-CM | POA: Insufficient documentation

## 2017-06-30 DIAGNOSIS — Z79899 Other long term (current) drug therapy: Secondary | ICD-10-CM | POA: Diagnosis not present

## 2017-06-30 DIAGNOSIS — M19011 Primary osteoarthritis, right shoulder: Secondary | ICD-10-CM | POA: Insufficient documentation

## 2017-06-30 DIAGNOSIS — E669 Obesity, unspecified: Secondary | ICD-10-CM | POA: Insufficient documentation

## 2017-06-30 DIAGNOSIS — M7541 Impingement syndrome of right shoulder: Secondary | ICD-10-CM | POA: Diagnosis present

## 2017-06-30 HISTORY — PX: SHOULDER ACROMIOPLASTY: SHX6093

## 2017-06-30 HISTORY — PX: SHOULDER ARTHROSCOPY: SHX128

## 2017-06-30 HISTORY — PX: RESECTION DISTAL CLAVICAL: SHX5053

## 2017-06-30 LAB — CBC
HCT: 39 % (ref 36.0–46.0)
HEMOGLOBIN: 12.9 g/dL (ref 12.0–15.0)
MCH: 30.4 pg (ref 26.0–34.0)
MCHC: 33.1 g/dL (ref 30.0–36.0)
MCV: 92 fL (ref 78.0–100.0)
Platelets: 181 10*3/uL (ref 150–400)
RBC: 4.24 MIL/uL (ref 3.87–5.11)
RDW: 13.9 % (ref 11.5–15.5)
WBC: 5.3 10*3/uL (ref 4.0–10.5)

## 2017-06-30 LAB — BASIC METABOLIC PANEL
ANION GAP: 11 (ref 5–15)
BUN: 10 mg/dL (ref 6–20)
CHLORIDE: 101 mmol/L (ref 101–111)
CO2: 23 mmol/L (ref 22–32)
Calcium: 8.7 mg/dL — ABNORMAL LOW (ref 8.9–10.3)
Creatinine, Ser: 0.55 mg/dL (ref 0.44–1.00)
GFR calc non Af Amer: >60 mL/min (ref >60)
Glucose, Bld: 91 mg/dL (ref 65–99)
Potassium: 3.5 mmol/L (ref 3.5–5.1)
Sodium: 135 mmol/L (ref 135–145)

## 2017-06-30 SURGERY — ARTHROSCOPY, SHOULDER
Anesthesia: General | Site: Shoulder | Laterality: Right

## 2017-06-30 MED ORDER — FENTANYL CITRATE (PF) 250 MCG/5ML IJ SOLN
INTRAMUSCULAR | Status: AC
  Filled 2017-06-30: qty 5

## 2017-06-30 MED ORDER — ONDANSETRON HCL 4 MG/2ML IJ SOLN
INTRAMUSCULAR | Status: AC
  Filled 2017-06-30: qty 2

## 2017-06-30 MED ORDER — PHENYLEPHRINE HCL 10 MG/ML IJ SOLN
INTRAVENOUS | Status: DC | PRN
  Administered 2017-06-30: 25 ug/min via INTRAVENOUS

## 2017-06-30 MED ORDER — SUCCINYLCHOLINE CHLORIDE 20 MG/ML IJ SOLN
INTRAMUSCULAR | Status: DC | PRN
  Administered 2017-06-30: 120 mg via INTRAVENOUS

## 2017-06-30 MED ORDER — ROCURONIUM BROMIDE 10 MG/ML (PF) SYRINGE
PREFILLED_SYRINGE | INTRAVENOUS | Status: AC
  Filled 2017-06-30: qty 5

## 2017-06-30 MED ORDER — CEFAZOLIN SODIUM-DEXTROSE 2-4 GM/100ML-% IV SOLN
2.0000 g | INTRAVENOUS | Status: AC
  Administered 2017-06-30: 2 g via INTRAVENOUS

## 2017-06-30 MED ORDER — ONDANSETRON HCL 4 MG/2ML IJ SOLN
INTRAMUSCULAR | Status: DC | PRN
  Administered 2017-06-30: 4 mg via INTRAVENOUS

## 2017-06-30 MED ORDER — DEXAMETHASONE SODIUM PHOSPHATE 4 MG/ML IJ SOLN
INTRAMUSCULAR | Status: DC | PRN
  Administered 2017-06-30: 10 mg via INTRAVENOUS

## 2017-06-30 MED ORDER — CHLORHEXIDINE GLUCONATE 4 % EX LIQD: 60.0000 mL | Freq: Once | CUTANEOUS | Status: DC

## 2017-06-30 MED ORDER — FENTANYL CITRATE (PF) 100 MCG/2ML IJ SOLN
INTRAMUSCULAR | Status: AC
  Administered 2017-06-30: 100 ug
  Filled 2017-06-30: qty 2

## 2017-06-30 MED ORDER — LACTATED RINGERS IV SOLN
INTRAVENOUS | Status: DC
Start: 2017-06-30 — End: 2017-06-30
  Administered 2017-06-30 (×2): via INTRAVENOUS

## 2017-06-30 MED ORDER — PROPOFOL 10 MG/ML IV BOLUS
INTRAVENOUS | Status: AC
  Filled 2017-06-30: qty 20

## 2017-06-30 MED ORDER — MIDAZOLAM HCL 2 MG/2ML IJ SOLN
INTRAMUSCULAR | Status: AC
  Administered 2017-06-30: 2 mg
  Filled 2017-06-30: qty 2

## 2017-06-30 MED ORDER — FENTANYL CITRATE (PF) 100 MCG/2ML IJ SOLN
INTRAMUSCULAR | Status: AC
  Administered 2017-06-30: 50 ug via INTRAVENOUS
  Filled 2017-06-30: qty 2

## 2017-06-30 MED ORDER — 0.9 % SODIUM CHLORIDE (POUR BTL) OPTIME
TOPICAL | Status: DC | PRN
Start: 2017-06-30 — End: 2017-06-30
  Administered 2017-06-30: 1000 mL

## 2017-06-30 MED ORDER — DEXAMETHASONE SODIUM PHOSPHATE 10 MG/ML IJ SOLN
INTRAMUSCULAR | Status: AC
  Filled 2017-06-30: qty 1

## 2017-06-30 MED ORDER — MIDAZOLAM HCL 2 MG/2ML IJ SOLN
INTRAMUSCULAR | Status: AC
  Administered 2017-06-30: 1 mg
  Filled 2017-06-30: qty 2

## 2017-06-30 MED ORDER — FENTANYL CITRATE (PF) 100 MCG/2ML IJ SOLN
INTRAMUSCULAR | Status: DC | PRN
  Administered 2017-06-30 (×5): 50 ug via INTRAVENOUS

## 2017-06-30 MED ORDER — PROPOFOL 10 MG/ML IV BOLUS
INTRAVENOUS | Status: DC | PRN
  Administered 2017-06-30: 200 mg via INTRAVENOUS

## 2017-06-30 MED ORDER — PHENYLEPHRINE 40 MCG/ML (10ML) SYRINGE FOR IV PUSH (FOR BLOOD PRESSURE SUPPORT)
PREFILLED_SYRINGE | INTRAVENOUS | Status: DC | PRN
  Administered 2017-06-30 (×5): 80 ug via INTRAVENOUS

## 2017-06-30 MED ORDER — LIDOCAINE 2% (20 MG/ML) 5 ML SYRINGE
INTRAMUSCULAR | Status: DC | PRN
  Administered 2017-06-30: 60 mg via INTRAVENOUS

## 2017-06-30 MED ORDER — BUPIVACAINE-EPINEPHRINE (PF) 0.5% -1:200000 IJ SOLN
INTRAMUSCULAR | Status: DC | PRN
  Administered 2017-06-30: 25 mL via PERINEURAL

## 2017-06-30 MED ORDER — EPHEDRINE SULFATE-NACL 50-0.9 MG/10ML-% IV SOSY
PREFILLED_SYRINGE | INTRAVENOUS | Status: DC | PRN
  Administered 2017-06-30 (×2): 10 mg via INTRAVENOUS

## 2017-06-30 MED ORDER — LIDOCAINE 2% (20 MG/ML) 5 ML SYRINGE
INTRAMUSCULAR | Status: AC
  Filled 2017-06-30: qty 5

## 2017-06-30 MED ORDER — FENTANYL CITRATE (PF) 100 MCG/2ML IJ SOLN
25.0000 ug | INTRAMUSCULAR | Status: DC | PRN
  Administered 2017-06-30 (×2): 50 ug via INTRAVENOUS

## 2017-06-30 MED ORDER — CEFAZOLIN SODIUM-DEXTROSE 2-4 GM/100ML-% IV SOLN
INTRAVENOUS | Status: AC
  Filled 2017-06-30: qty 100

## 2017-06-30 SURGICAL SUPPLY — 44 items
BLADE GREAT WHITE 4.2 (BLADE) ×2 IMPLANT
BLADE GREAT WHITE 4.2MM (BLADE) ×1
BUR VERTEX HOODED 4.5 (BURR) ×3 IMPLANT
CANNULA SHOULDER 7CM (CANNULA) ×3 IMPLANT
CANNULA TWIST IN 8.25X7CM (CANNULA) IMPLANT
COVER SURGICAL LIGHT HANDLE (MISCELLANEOUS) IMPLANT
DRAPE ORTHO SPLIT 77X108 STRL (DRAPES) ×4
DRAPE STERI 35X30 U-POUCH (DRAPES) ×3 IMPLANT
DRAPE SURG ORHT 6 SPLT 77X108 (DRAPES) ×2 IMPLANT
DRAPE U-SHAPE 47X51 STRL (DRAPES) ×3 IMPLANT
DRSG EMULSION OIL 3X3 NADH (GAUZE/BANDAGES/DRESSINGS) ×3 IMPLANT
DRSG PAD ABDOMINAL 8X10 ST (GAUZE/BANDAGES/DRESSINGS) ×3 IMPLANT
DURAPREP 26ML APPLICATOR (WOUND CARE) ×3 IMPLANT
GAUZE SPONGE 4X4 12PLY STRL (GAUZE/BANDAGES/DRESSINGS) ×3 IMPLANT
GAUZE SPONGE 4X4 12PLY STRL LF (GAUZE/BANDAGES/DRESSINGS) ×3 IMPLANT
GLOVE BIO SURGEON STRL SZ8 (GLOVE) ×3 IMPLANT
GLOVE BIOGEL PI IND STRL 8 (GLOVE) ×2 IMPLANT
GLOVE BIOGEL PI INDICATOR 8 (GLOVE) ×4
GLOVE SS N UNI LF 8.0 STRL (GLOVE) ×3 IMPLANT
GOWN STRL REUS W/ TWL LRG LVL3 (GOWN DISPOSABLE) ×1 IMPLANT
GOWN STRL REUS W/ TWL XL LVL3 (GOWN DISPOSABLE) ×2 IMPLANT
GOWN STRL REUS W/TWL LRG LVL3 (GOWN DISPOSABLE) ×2
GOWN STRL REUS W/TWL XL LVL3 (GOWN DISPOSABLE) ×4
KIT BASIN OR (CUSTOM PROCEDURE TRAY) ×3 IMPLANT
KIT ROOM TURNOVER OR (KITS) ×3 IMPLANT
MANIFOLD NEPTUNE II (INSTRUMENTS) ×3 IMPLANT
NEEDLE 22X1 1/2 (OR ONLY) (NEEDLE) IMPLANT
NS IRRIG 1000ML POUR BTL (IV SOLUTION) ×3 IMPLANT
PACK ARTHROSCOPY DSU (CUSTOM PROCEDURE TRAY) ×3 IMPLANT
PAD ABD 8X10 STRL (GAUZE/BANDAGES/DRESSINGS) ×3 IMPLANT
PAD ARMBOARD 7.5X6 YLW CONV (MISCELLANEOUS) ×6 IMPLANT
PROBE BIPOLAR ATHRO 135MM 90D (MISCELLANEOUS) ×3 IMPLANT
SET ARTHROSCOPY TUBING (MISCELLANEOUS) ×2
SET ARTHROSCOPY TUBING LN (MISCELLANEOUS) ×1 IMPLANT
SPONGE LAP 4X18 X RAY DECT (DISPOSABLE) ×3 IMPLANT
SUT ETHILON 3 0 PS 1 (SUTURE) ×3 IMPLANT
SYR CONTROL 10ML LL (SYRINGE) IMPLANT
TAPE CLOTH SURG 4X10 WHT LF (GAUZE/BANDAGES/DRESSINGS) ×3 IMPLANT
TOWEL OR 17X24 6PK STRL BLUE (TOWEL DISPOSABLE) ×3 IMPLANT
TOWEL OR 17X26 10 PK STRL BLUE (TOWEL DISPOSABLE) ×3 IMPLANT
TUBE CONNECTING 12'X1/4 (SUCTIONS) ×1
TUBE CONNECTING 12X1/4 (SUCTIONS) ×2 IMPLANT
WAND HAND CNTRL MULTIVAC 90 (MISCELLANEOUS) ×3 IMPLANT
WATER STERILE IRR 1000ML POUR (IV SOLUTION) ×3 IMPLANT

## 2017-06-30 NOTE — Anesthesia Procedure Notes (Signed)
Anesthesia Regional Block: Interscalene brachial plexus block   Pre-Anesthetic Checklist: ,, timeout performed, Correct Patient, Correct Site, Correct Laterality, Correct Procedure, Correct Position, site marked, Risks and benefits discussed, pre-op evaluation,  At surgeon's request and post-op pain management  Laterality: Right  Prep: chloraprep       Needles:   Needle Type: Echogenic Needle     Needle Length: 9cm  Needle Gauge: 21     Additional Needles:   Procedures:,,,, ultrasound used (permanent image in chart),,,,  Narrative:  Start time: 06/30/2017 11:25 AM End time: 06/30/2017 11:33 AM Injection made incrementally with aspirations every 5 mL. Anesthesiologist: Lyndle Herrlich

## 2017-06-30 NOTE — Anesthesia Procedure Notes (Signed)
Procedure Name: Intubation Date/Time: 06/30/2017 1:07 PM Performed by: Lieutenant Diego Pre-anesthesia Checklist: Patient identified, Emergency Drugs available, Suction available and Patient being monitored Patient Re-evaluated:Patient Re-evaluated prior to induction Oxygen Delivery Method: Circle system utilized Preoxygenation: Pre-oxygenation with 100% oxygen Induction Type: IV induction Ventilation: Mask ventilation without difficulty Laryngoscope Size: Miller and 2 Grade View: Grade I Tube type: Oral Tube size: 7.0 mm Number of attempts: 1 Airway Equipment and Method: Stylet and Oral airway Placement Confirmation: ETT inserted through vocal cords under direct vision,  positive ETCO2 and breath sounds checked- equal and bilateral Secured at: 21 cm Tube secured with: Tape Dental Injury: Teeth and Oropharynx as per pre-operative assessment

## 2017-06-30 NOTE — Interval H&P Note (Signed)
History and Physical Interval Note:  06/30/2017 12:11 PM  Melissa Allen  has presented today for surgery, with the diagnosis of RIGHT SHOULDER IMPINGEMENT AND AC PAIN  The various methods of treatment have been discussed with the patient and family. After consideration of risks, benefits and other options for treatment, the patient has consented to  Procedure(s): ARTHROSCOPY SHOULDER (Right) as a surgical intervention .  The patient's history has been reviewed, patient examined, no change in status, stable for surgery.  I have reviewed the patient's chart and labs.  Questions were answered to the patient's satisfaction.     Briyanna Billingham G

## 2017-06-30 NOTE — Transfer of Care (Signed)
Immediate Anesthesia Transfer of Care Note  Patient: Melissa Allen  Procedure(s) Performed: ARTHROSCOPY DEBRIDEMENT RIGHT SHOULDER (Right Shoulder) RIGHT RESECTION DISTAL CLAVICAL (Right Shoulder) RIGHT SHOULDER ACROMIOPLASTY (Right Shoulder)  Patient Location: PACU  Anesthesia Type:General and GA combined with regional for post-op pain  Level of Consciousness: awake, alert  and oriented  Airway & Oxygen Therapy: Patient Spontanous Breathing and Patient connected to face mask oxygen  Post-op Assessment: Report given to RN and Post -op Vital signs reviewed and stable  Post vital signs: Reviewed and stable  Last Vitals:  Vitals:   06/30/17 1138 06/30/17 1420  BP: (!) 151/76 (!) (P) 151/76  Pulse: (!) 118 (!) (P) 121  Resp: (!) 21 (P) 13  Temp:  (P) 36.7 C  SpO2: 96% (P) 93%    Last Pain:  Vitals:   06/30/17 1048  TempSrc:   PainSc: 9       Patients Stated Pain Goal: 3 (50/93/26 7124)  Complications: No apparent anesthesia complications

## 2017-06-30 NOTE — Anesthesia Postprocedure Evaluation (Signed)
Anesthesia Post Note  Patient: Melissa Allen  Procedure(s) Performed: ARTHROSCOPY DEBRIDEMENT RIGHT SHOULDER (Right Shoulder) RIGHT RESECTION DISTAL CLAVICAL (Right Shoulder) RIGHT SHOULDER ACROMIOPLASTY (Right Shoulder)     Patient location during evaluation: PACU Anesthesia Type: General Level of consciousness: awake and alert Pain management: pain level controlled Vital Signs Assessment: post-procedure vital signs reviewed and stable Respiratory status: spontaneous breathing, nonlabored ventilation, respiratory function stable and patient connected to nasal cannula oxygen Cardiovascular status: blood pressure returned to baseline and stable Postop Assessment: no apparent nausea or vomiting Anesthetic complications: no    Last Vitals:  Vitals:   06/30/17 1450 06/30/17 1505  BP: 134/60 (!) 143/89  Pulse: (!) 118 (!) 114  Resp: 16 13  Temp:    SpO2: 94% 94%    Last Pain:  Vitals:   06/30/17 1505  TempSrc:   PainSc: 7                  Gwendolynn Merkey DAVID

## 2017-06-30 NOTE — Anesthesia Preprocedure Evaluation (Addendum)
Anesthesia Evaluation  Patient identified by MRN, date of birth, ID band Patient awake    Reviewed: Allergy & Precautions, H&P , Patient's Chart, lab work & pertinent test results, reviewed documented beta blocker date and time   Airway Mallampati: II  TM Distance: >3 FB Neck ROM: full    Dental no notable dental hx.    Pulmonary    Pulmonary exam normal breath sounds clear to auscultation       Cardiovascular hypertension,  Rhythm:regular Rate:Normal     Neuro/Psych    GI/Hepatic   Endo/Other    Renal/GU      Musculoskeletal   Abdominal   Peds  Hematology   Anesthesia Other Findings PONV   Obesity (BMI 30.0-34.9)   Asthma   Anginal pain ([place])  one episode 4-5 years ago-[facility]-nonspecific-no problems since- Kidney stones    Poor venous access  hard to start iv-   Shortness of breath dyspnea    Anxiety   seizures   Arthritis   Pancreatitis 1986        Reproductive/Obstetrics                             Anesthesia Physical Anesthesia Plan  ASA: III  Anesthesia Plan: General   Post-op Pain Management:  Regional for Post-op pain   Induction: Intravenous  PONV Risk Score and Plan: 2 and Ondansetron, Dexamethasone and Treatment may vary due to age or medical condition  Airway Management Planned: Oral ETT  Additional Equipment:   Intra-op Plan:   Post-operative Plan: Extubation in OR  Informed Consent: I have reviewed the patients History and Physical, chart, labs and discussed the procedure including the risks, benefits and alternatives for the proposed anesthesia with the patient or authorized representative who has indicated his/her understanding and acceptance.   Dental Advisory Given  Plan Discussed with: CRNA and Surgeon  Anesthesia Plan Comments: ( Last GA  Ventilation: Mask ventilation without difficulty Laryngoscope Size: Glidescope and  3 Grade View: Grade I Tube type: Oral Tube size: 7.0 mm Number of attempts: 1)       Anesthesia Quick Evaluation

## 2017-06-30 NOTE — Op Note (Signed)
#  [id number] 

## 2017-07-01 ENCOUNTER — Encounter (HOSPITAL_COMMUNITY): Payer: Self-pay | Admitting: Orthopaedic Surgery

## 2017-07-02 NOTE — Op Note (Signed)
NAME:  Melissa Allen, Melissa Allen                      ACCOUNT NO.:  MEDICAL RECORD NO.:  [id number]  LOCATION:                                 FACILITY:  PHYSICIAN:  Monico Blitz. Rhona Raider, M.D.     DATE OF BIRTH:  DATE OF PROCEDURE:  06/30/2017 DATE OF DISCHARGE:                              OPERATIVE REPORT   PREOPERATIVE DIAGNOSES: 1. Right shoulder impingement. 2. Right shoulder acromioclavicular pain.  POSTOPERATIVE DIAGNOSES: 1. Right shoulder impingement. 2. Right shoulder acromioclavicular degeneration. 3. Right shoulder glenohumeral degenerative joint disease.  PROCEDURES: 1. Right shoulder arthroscopic debridement. 2. Right shoulder arthroscopic acromioclavicular resection. 3. Right shoulder arthroscopic acromioplasty.  ANESTHESIA:  General and block.  ATTENDING SURGEON:  Monico Blitz. Elyon Zoll, Md.  ASSISTANT:  Loni Dolly, PA.  INDICATION FOR PROCEDURE:  The patient is a 51 year old woman with several months of horrible right shoulder pain.  This has persisted despite injection and rest.  By MRI scan, she has some AC degeneration and cuff inflammation, but no complete tear.  She has disabling pain, which makes it hard for her to use her arm at all and to rest at night. She is offered an arthroscopy.  Informed operative consent was obtained after discussion of possible complications including reaction to anesthesia and infection.  SUMMARY OF FINDINGS AND PROCEDURE:  Under general anesthesia and a block, a right shoulder arthroscopy was performed.  Glenohumeral joint showed some significant degenerative change grade 3 and 4 in some locations.  She had several relatively large loose bodies in the shoulder, which were removed.  One of these measured greater than a centimeter in diameter.  The biceps tendon looked normal despite the MRI finding that it would not.  The rotator cuff appeared intact from below the partially torn.  This was debrided, but no tear worthy of repair was seen.   In the subacromial space, she had some irritation of the rotator cuff and a bursectomy was done, but no tear worthy of repair was found. She had a moderately prominent subacromial morphology addressed with a conservative acromioplasty and also had some degeneration at the [facility] joint addressed with a formal AC resection, removing a centimeter of the distal clavicle.  She was scheduled to go home same day.  DESCRIPTION OF PROCEDURE:  The patient was taken to the operating suite where general anesthetic was applied without difficulty.  She was also given a block in the pre-anesthesia area.  She was positioned in a beach- chair position and prepped and draped in normal sterile fashion.  After administration of IV Kefzol and an appropriate time out, an arthroscopy of the right shoulder was performed through a total of 3 portals. Findings were as noted above and procedure consisted predominantly of the debridement of the glenohumeral joint, removing the loose bodies and performing chondroplasties.  Unfortunately, she did have grade 3 and 4 change throughout most of the joint.  We then performed the acromioplasty.  This was fairly conservative and done with a bur in the lateral position followed by transfer of the bur to the posterior position.  I removed minimal bone.  At the [facility] joint, I performed a formal  decompression removing a centimeter of the distal clavicle with a bur.  The shoulder was thoroughly irrigated followed by reapproximation of portals loosely with nylon.  Adaptic was applied followed by dry gauze and paper tape.  Estimated blood loss and intraoperative fluids can be obtained from anesthesia records.  DISPOSITION:  The patient was extubated in the operating room and taken to the recovery room in stable condition.  She was to go home same day and follow up in the office closely.  I will contact her by phone tonight.     Monico Blitz Rhona Raider, M.D.     PGD/MEDQ  D:   06/30/2017  T:  07/01/2017  Job:  [id number]

## 2017-07-07 ENCOUNTER — Other Ambulatory Visit: Payer: Self-pay | Admitting: Family Medicine

## 2017-07-07 NOTE — Telephone Encounter (Signed)
Aug fill was told needs physical for future fills/denied/thx dmf

## 2017-07-08 ENCOUNTER — Telehealth: Payer: Self-pay | Admitting: Family Medicine

## 2017-07-08 NOTE — Telephone Encounter (Signed)
In Aug was advised needs physical for further fills/denied/thx dmf

## 2017-07-09 ENCOUNTER — Other Ambulatory Visit: Payer: Self-pay | Admitting: Family Medicine

## 2017-07-09 NOTE — Telephone Encounter (Signed)
Advised in Aug that needed OV for further fills/denied/thx dmf

## 2017-07-09 NOTE — Telephone Encounter (Signed)
Called 30d of Ambien to pharm/unable to get through to pt/thx dmf

## 2017-07-09 NOTE — Telephone Encounter (Signed)
Copied from [place] [phone]. Topic: Quick Communication - See Telephone Encounter >> Jul 09, 2017  2:23 PM Burnis Medin, NT wrote: CRM for notification. See Telephone encounter for: 07/09/17. Pt. Called in and said she went to get refill for zolpidem (AMBIEN) 10 MG tablet. Pt. Said the pharmacy denied it because she had to see the doctor. Pt. Has already made appointment to see doctor. Pt. Wants to know if she could get the prescription. Pt uses [facility] in [place]

## 2017-07-09 NOTE — Telephone Encounter (Signed)
Yes okay to refill now that she has an appointment scheduled.

## 2017-07-09 NOTE — Telephone Encounter (Signed)
Requesting: Zolpidem Contract: None UDS: None Last OV: TA-2.01.2018; JC-5.19.2018 Next OV: 11.05.2018 Last Refill: 10.03.2018  She sched appt for the 5th after I denied the Rx as she was informed last fill to sched appt for future fills/may I call in a refill now that she has made the appt?   Please advise

## 2017-07-13 ENCOUNTER — Ambulatory Visit: Payer: BC Managed Care – PPO | Admitting: Family Medicine

## 2017-07-13 ENCOUNTER — Encounter: Payer: Self-pay | Admitting: Family Medicine

## 2017-07-13 VITALS — BP 130/78 | HR 126 | Temp 98.5°F | Ht 66.0 in | Wt 201.8 lb

## 2017-07-13 DIAGNOSIS — R232 Flushing: Secondary | ICD-10-CM

## 2017-07-13 DIAGNOSIS — Z23 Encounter for immunization: Secondary | ICD-10-CM | POA: Diagnosis not present

## 2017-07-13 DIAGNOSIS — M129 Arthropathy, unspecified: Secondary | ICD-10-CM | POA: Diagnosis not present

## 2017-07-13 MED ORDER — ESTROGENS CONJUGATED 0.3 MG PO TABS: 0.3000 mg | ORAL_TABLET | Freq: Every day | ORAL | 3 refills | Status: DC

## 2017-07-13 NOTE — Assessment & Plan Note (Signed)
Probable OA but agree that checking rheum labsi s appropriate. Orders Placed This Encounter  Procedures  . Flu Vaccine QUAD 6+ mos PF IM (Fluarix Quad PF)  . C-reactive protein  . Sedimentation Rate  . Rheumatoid factor

## 2017-07-13 NOTE — Assessment & Plan Note (Signed)
eRx refills for premarin sent to pharmacy on file. She is aware of risk of HRT. Does not have a uterus and therefore does not need progesterone.

## 2017-07-13 NOTE — Progress Notes (Signed)
Subjective:   Patient ID: Melissa Allen, female    DOB: Apr 23, 1966, 51 y.o.   MRN: [id number]  Melissa Allen is a pleasant 51 y.o. year old female who presents to clinic today with Medication Refill (Patient is here today to have her Premarin refilled.  She is wondering if she needs another Pneumonia vaccine.  Her last Pneumo 13 was on 11.22.2016.  She has had Left knee surgery for torn meniscus & arthritis shaved, then tendon bicept and rotator cuff tear as well as arthritis shaved off in right shoulder in Sept)  on 07/13/2017  HPI:  Remote h/o hysterectomy. Has been on premarin for vaginal dryness/hot flashes. Was seeing Dr. Hulan Fray (gyn).  She would like for me to refill her premarin.  Has had so many issues with arthritis- DDD of spine, left knee surgery, right shoulder surgery.  She is wondering if she should rule out RA.  Current Outpatient Medications on File Prior to Visit  Medication Sig Dispense Refill  . albuterol (PROAIR HFA) 108 (90 Base) MCG/ACT inhaler INHALE TWO PUFFS EVERY FOUR HOURS AS NEEDED SHORTNESS OF BREATH 8.5 g 2  . ALPRAZolam (XANAX) 0.25 MG tablet Take 0.25-0.5 mg by mouth 2 (two) times daily as needed for anxiety.    . cyclobenzaprine (FLEXERIL) 10 MG tablet Take 10 mg by mouth 2 (two) times daily as needed for muscle spasms (muscle spasms). Take 1 tablet (10 mg) daily at bedtime, may take an additional tablet during the day as needed for muscle spasms    . DULoxetine (CYMBALTA) 60 MG capsule TAKE ONE CAPSULE EACH NIGHT AT BEDTIME * (Patient taking differently: Take 60 mg by mouth at bedtime. ) 90 capsule 3  . fluticasone (FLONASE) 50 MCG/ACT nasal spray USE 2 SPRAYS INTO EACH NOSTRIL ONCE DAILY AS DIRECTED. 16 g 2  . HYDROcodone-acetaminophen (NORCO/VICODIN) 5-325 MG tablet Take 1-2 tablets by mouth every 4 (four) hours as needed (breakthrough pain). (Patient taking differently: Take 1-2 tablets by mouth every 4 (four) hours as needed for moderate pain. ) 50 tablet 0    . hydrOXYzine (ATARAX/VISTARIL) 25 MG tablet Take 1-2 tablets (25-50 mg total) by mouth every 6 (six) hours as needed for itching. 30 tablet 0  . ipratropium-albuterol (DUONEB) 0.5-2.5 (3) MG/3ML SOLN Take 3 mLs by nebulization every 4 (four) hours as needed. (Patient taking differently: Take 3 mLs by nebulization every 4 (four) hours as needed (shortness of breath/wheezing). ) 360 mL 1  . losartan (COZAAR) 50 MG tablet TAKE 1 TABLET BY MOUTH DAILY 90 tablet 1  . mometasone-formoterol (DULERA) 200-5 MCG/ACT AERO Inhale 2 puffs into the lungs 2 (two) times daily. (Patient taking differently: Inhale 2 puffs into the lungs daily. ) 1 Inhaler 3  . PREMARIN 0.3 MG tablet TAKE 1 TABLET BY MOUTH DAILY 90 tablet 0  . promethazine (PHENERGAN) 12.5 MG tablet Take 1 tablet by mouth every 6 (six) hours as needed for nausea or vomiting.     Marland Kitchen tetrahydrozoline-zinc (VISINE-AC) 0.05-0.25 % ophthalmic solution Place 2 drops into both eyes 3 (three) times daily as needed (FOR ALLERGIES).    Marland Kitchen triamterene-hydrochlorothiazide (MAXZIDE-25) 37.5-25 MG tablet TAKE 1 TABLET BY MOUTH DAILY 90 tablet 1  . zolpidem (AMBIEN) 10 MG tablet TAKE ONE TABLET BY MOUTH EVERY NIGHT AT BEDTIME 30 tablet 0   No current facility-administered medications on file prior to visit.     Allergies  Allergen Reactions  . Latex Itching, Swelling and Dermatitis    SWELLING FROM  FACE MASK RING AFTER LAST SURGERY   . Lisinopril Cough       . Codeine Nausea And Vomiting  . Dilaudid [Hydromorphone Hcl] Itching       . Hydrocodone Itching  . Tape Rash    TOLERATES PAPER TAPE ONLY NO PINK SURGICAL TAPE EITHER    Past Medical History:  Diagnosis Date  . Allergy   . Anginal pain ([place])    one episode 4-5 years ago-[facility]-nonspecific-no problems since-  . Anxiety   . Arthritis    multiple areas of arthritis- DDD  . Asthma    recent admit to ER 01/11/12 for exac of asthma  . Calcaneal fracture   . Duplicated  ureter, right   . Fibromyalgia   . Hypertension   . Kidney stones   . Obesity (BMI 30.0-34.9)   . Pancreatitis 1986  . PONV (postoperative nausea and vomiting)   . Poor venous access    hard to start iv-  . 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 Shortness of breath dyspnea   . Wears glasses     Past Surgical History:  Procedure Laterality Date  . ABDOMINAL HYSTERECTOMY    . BUNIONECTOMY  1995   Right  . CHOLECYSTECTOMY  late 1980's      . KNEE CARTILAGE SURGERY     left  . KNEE SURGERY Left    x 2, Murphy/Sue, corrected Patella displacement   . LAMINECTOMY    . LUMBAR LAMINECTOMY  10/06   L4-5, Ray  . R compressed pronator Right    Sypher, R arm  . SPINAL FUSION    . TENDON REPAIR  2013  . TENDON REPAIR Right 02/01/12  . TONSILLECTOMY  age 35    Family History  Problem Relation Age of Onset  . Cancer Mother        pancreatic cancer  . Heart disease Father 77  . Colon polyps Father   . Colon cancer Paternal Grandfather   . Colon cancer Maternal Grandmother   . Uterine cancer Unknown        Grandmother  . Breast cancer Unknown        Grandmother  . Ovarian cancer Unknown        Grandmother  . Diabetes Unknown        mother  . Other Neg Hx     Social History   Socioeconomic History  . Marital status: Married    Spouse name: Not on file  . Number of children: 3  . Years of education: Not on file  . Highest education level: Not on file  Social Needs  . Financial resource strain: Not on file  . Food insecurity - worry: Not on file  . Food insecurity - inability: Not on file  . Transportation needs - medical: Not on file  . Transportation needs - non-medical: Not on file  Occupational History  . Occupation: Product manager: [employer]    Comment: [employer] (6th grade)  Tobacco Use  . Smoking status: Never Smoker  . Smokeless tobacco: Never Used  Substance and Sexual  Activity  . Alcohol use: Yes    Alcohol/week: 3.5 oz    Types: 7 Standard drinks or equivalent per week    Comment: Occasional- wine or liquor twice a week  . Drug use: No  . Sexual activity: Yes    Birth  control/protection: Surgical  Other Topics Concern  . 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   Review of Systems  Constitutional: Negative.   HENT: Negative.   Endocrine: Negative.   Genitourinary: Negative.   Musculoskeletal: Positive for arthralgias.  Hematological: Negative.   Psychiatric/Behavioral: Negative.   All other systems reviewed and are negative.      Objective:    BP 130/78 (BP Location: Left Arm, Patient Position: Sitting, Cuff Size: Normal)   Pulse (!) 126   Temp 98.5 F (36.9 C) (Oral)   Ht 5\' 6"  (1.676 m)   Wt 201 lb 12.8 oz (91.5 kg)   LMP 12/21/2011   SpO2 98%   BMI 32.57 kg/m    Physical Exam  Constitutional: She is oriented to person, place, and time. She appears well-developed and well-nourished. No distress.  HENT:  Head: Normocephalic and atraumatic.  Eyes: Conjunctivae are normal.  Cardiovascular: Normal rate and regular rhythm.  Pulmonary/Chest: Effort normal.  Musculoskeletal: Normal range of motion. She exhibits no edema.  Neurological: She is alert and oriented to person, place, and time. No cranial nerve deficit.  Skin: Skin is warm and dry. She is not diaphoretic.  Psychiatric: She has a normal mood and affect. Her behavior is normal. Judgment and thought content normal.  Nursing note and vitals reviewed.         Assessment & Plan:   Anxiety state  Depression, recurrent ([place])  Hot flashes  Need for influenza  vaccination - Plan: Flu Vaccine QUAD 6+ mos PF IM (Fluarix Quad PF) No Follow-up on file.

## 2017-07-14 NOTE — Addendum Note (Signed)
Addended by: Ellamae Sia on: 07/14/2017 08:42 AM   Modules accepted: Orders

## 2017-07-28 ENCOUNTER — Other Ambulatory Visit (INDEPENDENT_AMBULATORY_CARE_PROVIDER_SITE_OTHER): Payer: BC Managed Care – PPO

## 2017-07-28 DIAGNOSIS — M129 Arthropathy, unspecified: Secondary | ICD-10-CM

## 2017-07-28 LAB — SEDIMENTATION RATE: SED RATE: 32 mm/h — AB (ref 0–30)

## 2017-07-28 LAB — HIGH SENSITIVITY CRP: CRP, High Sensitivity: 24.23 mg/L — ABNORMAL HIGH (ref 0.000–5.000)

## 2017-07-29 LAB — RHEUMATOID FACTOR: Rhuematoid fact SerPl-aCnc: <14 IU/mL (ref <14)

## 2017-08-01 ENCOUNTER — Other Ambulatory Visit: Payer: Self-pay | Admitting: Family Medicine

## 2017-08-05 ENCOUNTER — Observation Stay (HOSPITAL_COMMUNITY): Payer: BC Managed Care – PPO | Admitting: Registered Nurse

## 2017-08-05 ENCOUNTER — Encounter (HOSPITAL_COMMUNITY): Payer: Self-pay | Admitting: Emergency Medicine

## 2017-08-05 ENCOUNTER — Encounter (HOSPITAL_COMMUNITY)
Admission: EM | Disposition: A | Discharge status: Home / Self Care | Payer: Self-pay | Source: Home / Self Care | Attending: Emergency Medicine

## 2017-08-05 ENCOUNTER — Other Ambulatory Visit: Payer: Self-pay

## 2017-08-05 ENCOUNTER — Observation Stay (HOSPITAL_COMMUNITY)
Admission: EM | Admit: 2017-08-05 | Discharge: 2017-08-06 | Disposition: A | Discharge status: Home / Self Care | Payer: BC Managed Care – PPO | Attending: General Surgery | Admitting: General Surgery

## 2017-08-05 ENCOUNTER — Emergency Department (HOSPITAL_COMMUNITY): Payer: BC Managed Care – PPO

## 2017-08-05 DIAGNOSIS — Z8249 Family history of ischemic heart disease and other diseases of the circulatory system: Secondary | ICD-10-CM | POA: Diagnosis not present

## 2017-08-05 DIAGNOSIS — K381 Appendicular concretions: Secondary | ICD-10-CM | POA: Insufficient documentation

## 2017-08-05 DIAGNOSIS — Z91040 Latex allergy status: Secondary | ICD-10-CM | POA: Insufficient documentation

## 2017-08-05 DIAGNOSIS — Z90710 Acquired absence of both cervix and uterus: Secondary | ICD-10-CM | POA: Insufficient documentation

## 2017-08-05 DIAGNOSIS — J45909 Unspecified asthma, uncomplicated: Secondary | ICD-10-CM | POA: Diagnosis not present

## 2017-08-05 DIAGNOSIS — Z6833 Body mass index (BMI) 33.0-33.9, adult: Secondary | ICD-10-CM | POA: Insufficient documentation

## 2017-08-05 DIAGNOSIS — Z888 Allergy status to other drugs, medicaments and biological substances status: Secondary | ICD-10-CM | POA: Diagnosis not present

## 2017-08-05 DIAGNOSIS — Z885 Allergy status to narcotic agent status: Secondary | ICD-10-CM | POA: Diagnosis not present

## 2017-08-05 DIAGNOSIS — Z87442 Personal history of urinary calculi: Secondary | ICD-10-CM | POA: Insufficient documentation

## 2017-08-05 DIAGNOSIS — M797 Fibromyalgia: Secondary | ICD-10-CM | POA: Diagnosis not present

## 2017-08-05 DIAGNOSIS — Z79899 Other long term (current) drug therapy: Secondary | ICD-10-CM | POA: Insufficient documentation

## 2017-08-05 DIAGNOSIS — F419 Anxiety disorder, unspecified: Secondary | ICD-10-CM | POA: Diagnosis not present

## 2017-08-05 DIAGNOSIS — F329 Major depressive disorder, single episode, unspecified: Secondary | ICD-10-CM | POA: Insufficient documentation

## 2017-08-05 DIAGNOSIS — K219 Gastro-esophageal reflux disease without esophagitis: Secondary | ICD-10-CM | POA: Diagnosis not present

## 2017-08-05 DIAGNOSIS — E669 Obesity, unspecified: Secondary | ICD-10-CM | POA: Diagnosis not present

## 2017-08-05 DIAGNOSIS — G40409 Other generalized epilepsy and epileptic syndromes, not intractable, without status epilepticus: Secondary | ICD-10-CM | POA: Insufficient documentation

## 2017-08-05 DIAGNOSIS — N83202 Unspecified ovarian cyst, left side: Secondary | ICD-10-CM | POA: Insufficient documentation

## 2017-08-05 DIAGNOSIS — K589 Irritable bowel syndrome without diarrhea: Secondary | ICD-10-CM | POA: Diagnosis not present

## 2017-08-05 DIAGNOSIS — M1990 Unspecified osteoarthritis, unspecified site: Secondary | ICD-10-CM | POA: Insufficient documentation

## 2017-08-05 DIAGNOSIS — I10 Essential (primary) hypertension: Secondary | ICD-10-CM | POA: Insufficient documentation

## 2017-08-05 DIAGNOSIS — G8929 Other chronic pain: Secondary | ICD-10-CM | POA: Insufficient documentation

## 2017-08-05 DIAGNOSIS — K3580 Unspecified acute appendicitis: Principal | ICD-10-CM

## 2017-08-05 HISTORY — PX: APPENDECTOMY: SHX54

## 2017-08-05 HISTORY — PX: LAPAROSCOPIC APPENDECTOMY: SHX408

## 2017-08-05 LAB — URINALYSIS, ROUTINE W REFLEX MICROSCOPIC
BILIRUBIN URINE: NEGATIVE
GLUCOSE, UA: NEGATIVE mg/dL
HGB URINE DIPSTICK: NEGATIVE
KETONES UR: NEGATIVE mg/dL
NITRITE: NEGATIVE
PH: 5 (ref 5.0–8.0)
Protein, ur: NEGATIVE mg/dL
Specific Gravity, Urine: 1.017 (ref 1.005–1.030)

## 2017-08-05 LAB — CBC WITH DIFFERENTIAL/PLATELET
BASOS PCT: 0 %
Basophils Absolute: 0.1 10*3/uL (ref 0.0–0.1)
EOS ABS: 0.1 10*3/uL (ref 0.0–0.7)
Eosinophils Relative: 1 %
HEMATOCRIT: 42 % (ref 36.0–46.0)
HEMOGLOBIN: 14.1 g/dL (ref 12.0–15.0)
Lymphocytes Relative: 14 %
Lymphs Abs: 1.7 10*3/uL (ref 0.7–4.0)
MCH: 30.5 pg (ref 26.0–34.0)
MCHC: 33.6 g/dL (ref 30.0–36.0)
MCV: 90.9 fL (ref 78.0–100.0)
Monocytes Absolute: 0.9 10*3/uL (ref 0.1–1.0)
Monocytes Relative: 8 %
NEUTROS PCT: 77 %
Neutro Abs: 9.7 10*3/uL — ABNORMAL HIGH (ref 1.7–7.7)
Platelets: 285 10*3/uL (ref 150–400)
RBC: 4.62 MIL/uL (ref 3.87–5.11)
RDW: 13 % (ref 11.5–15.5)
WBC: 12.4 10*3/uL — AB (ref 4.0–10.5)

## 2017-08-05 LAB — WET PREP, GENITAL
CLUE CELLS WET PREP: NONE SEEN
Sperm: NONE SEEN
Trich, Wet Prep: NONE SEEN
Yeast Wet Prep HPF POC: NONE SEEN

## 2017-08-05 LAB — I-STAT CG4 LACTIC ACID, ED
LACTIC ACID, VENOUS: 0.85 mmol/L (ref 0.5–1.9)
Lactic Acid, Venous: 2.01 mmol/L (ref 0.5–1.9)

## 2017-08-05 LAB — HEPATIC FUNCTION PANEL
ALK PHOS: 103 U/L (ref 38–126)
ALT: 17 U/L (ref 14–54)
AST: 23 U/L (ref 15–41)
Albumin: 4 g/dL (ref 3.5–5.0)
BILIRUBIN DIRECT: 0.1 mg/dL (ref 0.1–0.5)
BILIRUBIN TOTAL: 0.9 mg/dL (ref 0.3–1.2)
Indirect Bilirubin: 0.8 mg/dL (ref 0.3–0.9)
Total Protein: 7.3 g/dL (ref 6.5–8.1)

## 2017-08-05 LAB — BASIC METABOLIC PANEL
Anion gap: 9 (ref 5–15)
BUN: 18 mg/dL (ref 6–20)
CHLORIDE: 102 mmol/L (ref 101–111)
CO2: 26 mmol/L (ref 22–32)
Calcium: 9.4 mg/dL (ref 8.9–10.3)
Creatinine, Ser: 0.77 mg/dL (ref 0.44–1.00)
GFR calc non Af Amer: >60 mL/min (ref >60)
Glucose, Bld: 122 mg/dL — ABNORMAL HIGH (ref 65–99)
POTASSIUM: 3.4 mmol/L — AB (ref 3.5–5.1)
SODIUM: 137 mmol/L (ref 135–145)

## 2017-08-05 LAB — LIPASE, BLOOD: LIPASE: 21 U/L (ref 11–51)

## 2017-08-05 SURGERY — APPENDECTOMY, LAPAROSCOPIC
Anesthesia: General

## 2017-08-05 MED ORDER — KETOROLAC TROMETHAMINE 30 MG/ML IJ SOLN
30.0000 mg | Freq: Four times a day (QID) | INTRAMUSCULAR | Status: DC | PRN
  Administered 2017-08-05: 30 mg via INTRAVENOUS
  Filled 2017-08-05 (×2): qty 1

## 2017-08-05 MED ORDER — HYDROMORPHONE HCL 1 MG/ML IJ SOLN
1.0000 mg | Freq: Once | INTRAMUSCULAR | Status: AC
  Administered 2017-08-05: 1 mg via INTRAVENOUS
  Filled 2017-08-05: qty 1

## 2017-08-05 MED ORDER — BUPIVACAINE-EPINEPHRINE (PF) 0.5% -1:200000 IJ SOLN
INTRAMUSCULAR | Status: AC
Start: 2017-08-05 — End: ?
  Filled 2017-08-05: qty 30

## 2017-08-05 MED ORDER — DULOXETINE HCL 60 MG PO CPEP
60.0000 mg | ORAL_CAPSULE | Freq: Every day | ORAL | Status: DC
  Administered 2017-08-05: 60 mg via ORAL
  Filled 2017-08-05: qty 1

## 2017-08-05 MED ORDER — ONDANSETRON HCL 4 MG/2ML IJ SOLN
4.0000 mg | Freq: Four times a day (QID) | INTRAMUSCULAR | Status: DC | PRN
Start: 2017-08-05 — End: 2017-08-06

## 2017-08-05 MED ORDER — MORPHINE SULFATE (PF) 4 MG/ML IV SOLN: 1.0000 mg | INTRAVENOUS | Status: DC | PRN

## 2017-08-05 MED ORDER — LACTATED RINGERS IR SOLN
Status: DC | PRN
  Administered 2017-08-05: 1000 mL

## 2017-08-05 MED ORDER — HYDROXYZINE HCL 25 MG PO TABS: 25.0000 mg | ORAL_TABLET | Freq: Four times a day (QID) | ORAL | Status: DC | PRN

## 2017-08-05 MED ORDER — TRAMADOL HCL 50 MG PO TABS
50.0000 mg | ORAL_TABLET | Freq: Four times a day (QID) | ORAL | Status: DC | PRN
  Filled 2017-08-05: qty 1

## 2017-08-05 MED ORDER — DEXAMETHASONE SODIUM PHOSPHATE 10 MG/ML IJ SOLN
INTRAMUSCULAR | Status: DC | PRN
  Administered 2017-08-05: 10 mg via INTRAVENOUS

## 2017-08-05 MED ORDER — ZOLPIDEM TARTRATE 10 MG PO TABS
5.0000 mg | ORAL_TABLET | Freq: Every day | ORAL | Status: DC
  Administered 2017-08-05: 5 mg via ORAL
  Filled 2017-08-05: qty 1

## 2017-08-05 MED ORDER — ROCURONIUM BROMIDE 100 MG/10ML IV SOLN
INTRAVENOUS | Status: DC | PRN
  Administered 2017-08-05: 50 mg via INTRAVENOUS

## 2017-08-05 MED ORDER — PROMETHAZINE HCL 25 MG/ML IJ SOLN: 6.2500 mg | INTRAMUSCULAR | Status: DC | PRN

## 2017-08-05 MED ORDER — MORPHINE SULFATE (PF) 2 MG/ML IV SOLN
2.0000 mg | INTRAVENOUS | Status: DC | PRN
  Administered 2017-08-06: 2 mg via INTRAVENOUS
  Filled 2017-08-05 (×2): qty 1

## 2017-08-05 MED ORDER — ONDANSETRON HCL 4 MG/2ML IJ SOLN
4.0000 mg | Freq: Once | INTRAMUSCULAR | Status: AC
  Administered 2017-08-05: 4 mg via INTRAVENOUS
  Filled 2017-08-05: qty 2

## 2017-08-05 MED ORDER — IPRATROPIUM-ALBUTEROL 0.5-2.5 (3) MG/3ML IN SOLN: 3.0000 mL | RESPIRATORY_TRACT | Status: DC | PRN

## 2017-08-05 MED ORDER — TRIAMTERENE-HCTZ 37.5-25 MG PO TABS
1.0000 | ORAL_TABLET | Freq: Every day | ORAL | Status: DC
  Administered 2017-08-06: 1 via ORAL
  Filled 2017-08-05: qty 1

## 2017-08-05 MED ORDER — ONDANSETRON HCL 4 MG/2ML IJ SOLN
INTRAMUSCULAR | Status: DC | PRN
  Administered 2017-08-05: 4 mg via INTRAVENOUS

## 2017-08-05 MED ORDER — CEFTRIAXONE SODIUM 2 G IJ SOLR
2.0000 g | INTRAMUSCULAR | Status: DC
  Administered 2017-08-05: 2 g via INTRAVENOUS

## 2017-08-05 MED ORDER — DIPHENHYDRAMINE HCL 50 MG/ML IJ SOLN
12.5000 mg | Freq: Once | INTRAMUSCULAR | Status: AC
  Administered 2017-08-05: 12.5 mg via INTRAVENOUS
  Filled 2017-08-05: qty 1

## 2017-08-05 MED ORDER — POLYETHYLENE GLYCOL 3350 17 G PO PACK: 17.0000 g | PACK | Freq: Every day | ORAL | Status: DC | PRN

## 2017-08-05 MED ORDER — HYDROCODONE-ACETAMINOPHEN 5-325 MG PO TABS
1.0000 | ORAL_TABLET | ORAL | Status: DC | PRN
  Administered 2017-08-06: 2 via ORAL
  Filled 2017-08-05: qty 2

## 2017-08-05 MED ORDER — IOPAMIDOL (ISOVUE-300) INJECTION 61%
INTRAVENOUS | Status: AC
  Administered 2017-08-05: 100 mL via INTRAVENOUS
  Filled 2017-08-05: qty 100

## 2017-08-05 MED ORDER — ACETAMINOPHEN 10 MG/ML IV SOLN
INTRAVENOUS | Status: DC | PRN
  Administered 2017-08-05: 1000 mg via INTRAVENOUS

## 2017-08-05 MED ORDER — LIDOCAINE HCL (CARDIAC) 20 MG/ML IV SOLN
INTRAVENOUS | Status: DC | PRN
  Administered 2017-08-05: 25 mg via INTRATRACHEAL
  Administered 2017-08-05: 75 mg via INTRAVENOUS

## 2017-08-05 MED ORDER — IOPAMIDOL (ISOVUE-300) INJECTION 61%
100.0000 mL | Freq: Once | INTRAVENOUS | Status: AC | PRN
Start: 2017-08-05 — End: 2017-08-05
  Administered 2017-08-05: 100 mL via INTRAVENOUS

## 2017-08-05 MED ORDER — DEXAMETHASONE SODIUM PHOSPHATE 10 MG/ML IJ SOLN: INTRAMUSCULAR | Status: DC | PRN

## 2017-08-05 MED ORDER — PROPOFOL 10 MG/ML IV BOLUS
INTRAVENOUS | Status: DC | PRN
  Administered 2017-08-05: 200 mg via INTRAVENOUS

## 2017-08-05 MED ORDER — MIDAZOLAM HCL 2 MG/2ML IJ SOLN
INTRAMUSCULAR | Status: AC
Start: 2017-08-05 — End: ?
  Filled 2017-08-05: qty 2

## 2017-08-05 MED ORDER — FENTANYL CITRATE (PF) 250 MCG/5ML IJ SOLN
INTRAMUSCULAR | Status: AC
  Filled 2017-08-05: qty 5

## 2017-08-05 MED ORDER — MORPHINE SULFATE (PF) 4 MG/ML IV SOLN
INTRAVENOUS | Status: AC
  Filled 2017-08-05: qty 1

## 2017-08-05 MED ORDER — DEXTROSE 5 % IV SOLN
INTRAVENOUS | Status: AC
  Filled 2017-08-05: qty 2

## 2017-08-05 MED ORDER — FENTANYL CITRATE (PF) 100 MCG/2ML IJ SOLN
50.0000 ug | INTRAMUSCULAR | Status: DC | PRN
  Administered 2017-08-05: 100 ug via INTRAVENOUS

## 2017-08-05 MED ORDER — PROPOFOL 10 MG/ML IV BOLUS
INTRAVENOUS | Status: AC
  Filled 2017-08-05: qty 20

## 2017-08-05 MED ORDER — ENOXAPARIN SODIUM 40 MG/0.4ML ~~LOC~~ SOLN
40.0000 mg | SUBCUTANEOUS | Status: DC
  Administered 2017-08-06: 40 mg via SUBCUTANEOUS
  Filled 2017-08-05: qty 0.4

## 2017-08-05 MED ORDER — ONDANSETRON 4 MG PO TBDP: 4.0000 mg | ORAL_TABLET | Freq: Four times a day (QID) | ORAL | Status: DC | PRN

## 2017-08-05 MED ORDER — FENTANYL CITRATE (PF) 100 MCG/2ML IJ SOLN
INTRAMUSCULAR | Status: DC | PRN
  Administered 2017-08-05: 50 ug via INTRAVENOUS
  Administered 2017-08-05 (×3): 100 ug via INTRAVENOUS

## 2017-08-05 MED ORDER — ALPRAZOLAM 0.25 MG PO TABS
0.2500 mg | ORAL_TABLET | Freq: Two times a day (BID) | ORAL | Status: DC | PRN
  Administered 2017-08-05: 0.5 mg via ORAL
  Filled 2017-08-05: qty 2

## 2017-08-05 MED ORDER — ACETAMINOPHEN 10 MG/ML IV SOLN
INTRAVENOUS | Status: AC
  Filled 2017-08-05: qty 100

## 2017-08-05 MED ORDER — SUCCINYLCHOLINE CHLORIDE 20 MG/ML IJ SOLN
INTRAMUSCULAR | Status: DC | PRN
  Administered 2017-08-05: 120 mg via INTRAVENOUS

## 2017-08-05 MED ORDER — MORPHINE SULFATE (PF) 4 MG/ML IV SOLN
4.0000 mg | Freq: Once | INTRAVENOUS | Status: AC
  Administered 2017-08-05: 4 mg via INTRAVENOUS
  Filled 2017-08-05: qty 1

## 2017-08-05 MED ORDER — SUGAMMADEX SODIUM 500 MG/5ML IV SOLN
INTRAVENOUS | Status: AC
  Filled 2017-08-05: qty 5

## 2017-08-05 MED ORDER — DIPHENHYDRAMINE HCL 12.5 MG/5ML PO ELIX: 12.5000 mg | ORAL_SOLUTION | Freq: Four times a day (QID) | ORAL | Status: DC | PRN

## 2017-08-05 MED ORDER — MIDAZOLAM HCL 5 MG/5ML IJ SOLN
INTRAMUSCULAR | Status: DC | PRN
  Administered 2017-08-05 (×2): 1 mg via INTRAVENOUS
  Administered 2017-08-05: 2 mg via INTRAVENOUS

## 2017-08-05 MED ORDER — LOSARTAN POTASSIUM 50 MG PO TABS
50.0000 mg | ORAL_TABLET | Freq: Every day | ORAL | Status: DC
  Administered 2017-08-06: 50 mg via ORAL
  Filled 2017-08-05: qty 1

## 2017-08-05 MED ORDER — SODIUM CHLORIDE 0.9 % IV BOLUS (SEPSIS)
1000.0000 mL | Freq: Once | INTRAVENOUS | Status: AC
  Administered 2017-08-05: 1000 mL via INTRAVENOUS

## 2017-08-05 MED ORDER — NAPHAZOLINE-GLYCERIN 0.012-0.2 % OP SOLN
1.0000 [drp] | Freq: Four times a day (QID) | OPHTHALMIC | Status: DC | PRN
  Filled 2017-08-05: qty 15

## 2017-08-05 MED ORDER — FENTANYL CITRATE (PF) 250 MCG/5ML IJ SOLN
INTRAMUSCULAR | Status: AC
Start: 2017-08-05 — End: ?
  Filled 2017-08-05: qty 5

## 2017-08-05 MED ORDER — FENTANYL CITRATE (PF) 100 MCG/2ML IJ SOLN
INTRAMUSCULAR | Status: AC
  Filled 2017-08-05: qty 2

## 2017-08-05 MED ORDER — ONDANSETRON HCL 4 MG/2ML IJ SOLN
INTRAMUSCULAR | Status: AC
  Filled 2017-08-05: qty 2

## 2017-08-05 MED ORDER — METRONIDAZOLE IN NACL 5-0.79 MG/ML-% IV SOLN
500.0000 mg | Freq: Three times a day (TID) | INTRAVENOUS | Status: DC
  Administered 2017-08-05: 500 mg via INTRAVENOUS
  Filled 2017-08-05 (×2): qty 100

## 2017-08-05 MED ORDER — CYCLOBENZAPRINE HCL 10 MG PO TABS
10.0000 mg | ORAL_TABLET | Freq: Two times a day (BID) | ORAL | Status: DC | PRN
  Administered 2017-08-06: 10 mg via ORAL
  Filled 2017-08-05: qty 1

## 2017-08-05 MED ORDER — DIPHENHYDRAMINE HCL 50 MG/ML IJ SOLN
12.5000 mg | Freq: Four times a day (QID) | INTRAMUSCULAR | Status: DC | PRN
Start: 2017-08-05 — End: 2017-08-06

## 2017-08-05 MED ORDER — MIDAZOLAM HCL 2 MG/2ML IJ SOLN
INTRAMUSCULAR | Status: AC
  Filled 2017-08-05: qty 2

## 2017-08-05 MED ORDER — POTASSIUM CHLORIDE IN NACL 20-0.9 MEQ/L-% IV SOLN
INTRAVENOUS | Status: DC
  Administered 2017-08-05 – 2017-08-06 (×2): via INTRAVENOUS
  Filled 2017-08-05 (×3): qty 1000

## 2017-08-05 MED ORDER — FLUTICASONE PROPIONATE 50 MCG/ACT NA SUSP
1.0000 | Freq: Every day | NASAL | Status: DC
  Administered 2017-08-05 – 2017-08-06 (×2): 1 via NASAL
  Filled 2017-08-05: qty 16

## 2017-08-05 MED ORDER — SUGAMMADEX SODIUM 500 MG/5ML IV SOLN
INTRAVENOUS | Status: DC | PRN
  Administered 2017-08-05: 375 mg via INTRAVENOUS

## 2017-08-05 MED ORDER — HYDRALAZINE HCL 20 MG/ML IJ SOLN: 10.0000 mg | INTRAMUSCULAR | Status: DC | PRN

## 2017-08-05 MED ORDER — ALBUTEROL SULFATE (2.5 MG/3ML) 0.083% IN NEBU: 3.0000 mL | INHALATION_SOLUTION | Freq: Four times a day (QID) | RESPIRATORY_TRACT | Status: DC | PRN

## 2017-08-05 MED ORDER — BUPIVACAINE-EPINEPHRINE 0.5% -1:200000 IJ SOLN
INTRAMUSCULAR | Status: DC | PRN
  Administered 2017-08-05: 30 mL

## 2017-08-05 MED ORDER — MORPHINE SULFATE (PF) 2 MG/ML IV SOLN
1.0000 mg | INTRAVENOUS | Status: DC | PRN
  Administered 2017-08-05: 2 mg via INTRAVENOUS
  Filled 2017-08-05: qty 1

## 2017-08-05 MED ORDER — LACTATED RINGERS IV SOLN
INTRAVENOUS | Status: DC
  Administered 2017-08-05: 1000 mL via INTRAVENOUS
  Administered 2017-08-05: 14:00:00 via INTRAVENOUS
  Administered 2017-08-05: 1000 mL via INTRAVENOUS

## 2017-08-05 SURGICAL SUPPLY — 45 items
APPLIER CLIP 5 13 M/L LIGAMAX5 (MISCELLANEOUS)
APPLIER CLIP ROT 10 11.4 M/L (STAPLE)
BANDAGE ADH SHEER 1  50/CT (GAUZE/BANDAGES/DRESSINGS) ×15 IMPLANT
BENZOIN TINCTURE PRP APPL 2/3 (GAUZE/BANDAGES/DRESSINGS) ×6 IMPLANT
CABLE HIGH FREQUENCY MONO STRZ (ELECTRODE) ×3 IMPLANT
CLIP APPLIE 5 13 M/L LIGAMAX5 (MISCELLANEOUS) IMPLANT
CLIP APPLIE ROT 10 11.4 M/L (STAPLE) IMPLANT
CLIP VESOLOCK XL 6/CT (CLIP) ×3 IMPLANT
CLOSURE STERI-STRIP 1/4X4 (GAUZE/BANDAGES/DRESSINGS) ×3 IMPLANT
CLOSURE WOUND 1/2 X4 (GAUZE/BANDAGES/DRESSINGS) ×1
COVER SURGICAL LIGHT HANDLE (MISCELLANEOUS) ×3 IMPLANT
CUTTER FLEX LINEAR 45M (STAPLE) IMPLANT
DECANTER SPIKE VIAL GLASS SM (MISCELLANEOUS) ×3 IMPLANT
DRAIN CHANNEL 19F RND (DRAIN) IMPLANT
DRAPE LAPAROSCOPIC ABDOMINAL (DRAPES) IMPLANT
ELECT REM PT RETURN 15FT ADLT (MISCELLANEOUS) ×3 IMPLANT
ENDOLOOP SUT PDS II  0 18 (SUTURE)
ENDOLOOP SUT PDS II 0 18 (SUTURE) IMPLANT
EVACUATOR SILICONE 100CC (DRAIN) IMPLANT
GLOVE BIOGEL PI IND STRL 7.0 (GLOVE) ×5 IMPLANT
GLOVE BIOGEL PI INDICATOR 7.0 (GLOVE) ×10
GLOVE SURG SS PI 7.0 STRL IVOR (GLOVE) ×3 IMPLANT
GOWN STRL REUS W/TWL LRG LVL3 (GOWN DISPOSABLE) ×3 IMPLANT
GOWN STRL REUS W/TWL XL LVL3 (GOWN DISPOSABLE) IMPLANT
GRASPER SUT TROCAR 14GX15 (MISCELLANEOUS) IMPLANT
KIT BASIN OR (CUSTOM PROCEDURE TRAY) ×3 IMPLANT
POUCH RETRIEVAL ECOSAC 10 (ENDOMECHANICALS) ×1 IMPLANT
POUCH RETRIEVAL ECOSAC 10MM (ENDOMECHANICALS) ×2
RELOAD 45 VASCULAR/THIN (ENDOMECHANICALS) IMPLANT
RELOAD STAPLE TA45 3.5 REG BLU (ENDOMECHANICALS) IMPLANT
SCISSORS LAP 5X35 DISP (ENDOMECHANICALS) ×3 IMPLANT
SET IRRIG TUBING LAPAROSCOPIC (IRRIGATION / IRRIGATOR) ×3 IMPLANT
SET TUBE IRRIG SUCTION NO TIP (IRRIGATION / IRRIGATOR) ×3 IMPLANT
SHEARS HARMONIC ACE PLUS 36CM (ENDOMECHANICALS) IMPLANT
SLEEVE XCEL OPT CAN 5 100 (ENDOMECHANICALS) ×3 IMPLANT
STRIP CLOSURE SKIN 1/2X4 (GAUZE/BANDAGES/DRESSINGS) ×2 IMPLANT
SUT ETHILON 2 0 PS N (SUTURE) IMPLANT
SUT MNCRL AB 4-0 PS2 18 (SUTURE) ×3 IMPLANT
TOWEL OR 17X26 10 PK STRL BLUE (TOWEL DISPOSABLE) ×3 IMPLANT
TOWEL OR NON WOVEN STRL DISP B (DISPOSABLE) ×3 IMPLANT
TRAY LAPAROSCOPIC (CUSTOM PROCEDURE TRAY) ×3 IMPLANT
TROCAR BLADELESS OPT 5 100 (ENDOMECHANICALS) ×3 IMPLANT
TROCAR XCEL BLUNT TIP 100MML (ENDOMECHANICALS) IMPLANT
TROCAR XCEL NON-BLD 11X100MML (ENDOMECHANICALS) ×3 IMPLANT
TUBING INSUF HEATED (TUBING) ×3 IMPLANT

## 2017-08-05 NOTE — H&P (Signed)
Melissa Allen is an 51 y.o. female.   Chief Complaint: Right lower quadrant pain HPI: 51 year old female presented from home with complaints of right lower quadrant pain and tenderness that began last evening.  Patient reports the pain started yesterday afternoon but became severe and kept her up all night.  She did have some loose stools with this.  She has a history of kidney stones and was concerned this might be the issue on admission.  Pains associated with nausea and chills.  No vomiting.  Workup in the ED shadows  She is afebrile heart rate is 114-118 range.  BP is stable.   CMP is normal except for a potassium of 3.4 and a glucose of 122.  Lipase was 21 AST 23 ALT is 17 total bilirubin is 0.9.  WBC is 12.4 hemoglobin 14, hematocrit 42 with normal platelets, 285,000.  CT scan shows appendicolith at the base of the appendix and the mid to distal appendix.  The appendix was 11 mm in diameter.  There was no fluid collection or perforation noted.  We are asked to see.  Past Medical History:  Diagnosis Date  . Allergy   . Anginal pain ([place])    one episode 4-5 years ago-[facility]-nonspecific-no problems since-  . Anxiety   . Arthritis    multiple areas of arthritis- DDD  . Asthma    recent admit to ER 01/11/12 for exac of asthma  . Calcaneal fracture   . Duplicated ureter, right   . Fibromyalgia   . Hypertension   . Kidney stones   . Obesity (BMI 30.0-34.9)   . Pancreatitis 1986  . PONV (postoperative nausea and vomiting)   . Poor venous access    hard to start iv-  . 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 Shortness of breath dyspnea   . Wears glasses     Past Surgical History:  Procedure Laterality Date  . ABDOMINAL HYSTERECTOMY    . BLADDER SUSPENSION  03/01/2012   Procedure: TRANSVAGINAL TAPE (TVT) PROCEDURE;  Surgeon: Emily Filbert, MD;  Location: [facility];  Service: Gynecology;  Laterality: N/A;  .  BUNIONECTOMY  1995   Right  . CHOLECYSTECTOMY  late 1980's      . CYSTOSCOPY  03/01/2012   Procedure: CYSTOSCOPY;  Surgeon: Emily Filbert, MD;  Location: [facility];  Service: Gynecology;  Laterality: N/A;  . CYSTOSCOPY W/ URETERAL STENT PLACEMENT Left 12/15/2014   Procedure: CYSTOSCOPY, LEFT URETEROSCOPYU WITH STONE EXTRACTION;  Surgeon: Irine Seal, MD;  Location: [facility];  Service: Urology;  Laterality: Left;  . CYSTOSCOPY W/ URETERAL STENT PLACEMENT Left 10/02/2016   Procedure: CYSTOSCOPY WITH RETROGRADE PYELOGRAM/URETERAL STENT PLACEMENT ureteroscopy, basket extraction;  Surgeon: Cleon Gustin, MD;  Location: [facility];  Service: Urology;  Laterality: Left;  . HARDWARE REMOVAL N/A 12/22/2016   Procedure: Lumbar Four-Five Removal of Hardware;  Surgeon: Earnie Larsson, MD;  Location: [facility];  Service: Neurosurgery;  Laterality: N/A;  . INCISION AND DRAINAGE ABSCESS Left 07/19/2013   Procedure: INCISION AND DRAINAGE LEFT LOWER EXTERMITY HEMATOMA;  Surgeon: Imogene Burn. Georgette Dover, MD;  Location: [facility];  Service: General;  Laterality: Left;  Excision left lower leg mass  . KNEE CARTILAGE SURGERY     left  . KNEE SURGERY Left    x 2, Murphy/Sue, corrected Patella displacement   . LAMINECTOMY    . LUMBAR LAMINECTOMY  10/06   L4-5, Ray  . R compressed pronator Right    Sypher, R arm  . RESECTION DISTAL CLAVICAL Right 06/30/2017   Procedure: RIGHT RESECTION DISTAL CLAVICAL;  Surgeon: Melrose Nakayama, MD;  Location: [facility];  Service: Orthopedics;  Laterality: Right;  . SHOULDER ACROMIOPLASTY Right 06/30/2017   Procedure: RIGHT SHOULDER ACROMIOPLASTY;  Surgeon: Melrose Nakayama, MD;  Location: [facility];  Service: Orthopedics;  Laterality: Right;  . SHOULDER ARTHROSCOPY Right 06/30/2017   Procedure: ARTHROSCOPY DEBRIDEMENT RIGHT SHOULDER;  Surgeon: Melrose Nakayama, MD;  Location: [facility];  Service: Orthopedics;  Laterality: Right;  . SPINAL FUSION    . TENDON REPAIR  2013  . TENDON REPAIR Right  02/01/12  . TONSILLECTOMY  age 40    Family History  Problem Relation Age of Onset  . Cancer Mother        pancreatic cancer  . Heart disease Father 63  . Colon polyps Father   . Colon cancer Paternal Grandfather   . Colon cancer Maternal Grandmother   . Uterine cancer Unknown        Grandmother  . Breast cancer Unknown        Grandmother  . Ovarian cancer Unknown        Grandmother  . Diabetes Unknown        mother  . Other Neg Hx    Social History:  reports that  has never smoked. she has never used smokeless tobacco. She reports that she drinks about 3.5 oz of alcohol per week. She reports that she does not use drugs.  Allergies:  Allergies  Allergen Reactions  . Latex Itching, Swelling and Dermatitis    SWELLING FROM FACE MASK RING AFTER LAST SURGERY   . Lisinopril Cough       . Codeine Nausea And Vomiting  . Dilaudid [Hydromorphone Hcl] Itching       . Hydrocodone Itching  . Tape Rash    TOLERATES PAPER TAPE ONLY NO PINK SURGICAL TAPE EITHER    Prior to Admission medications   Medication Sig Start Date End Date Taking? Authorizing Provider  albuterol [facility] HFA) 108 (90 Base) MCG/ACT inhaler INHALE TWO PUFFS EVERY FOUR HOURS AS NEEDED SHORTNESS OF BREATH 08/27/16  Yes Biagio Borg, MD  ALPRAZolam Duanne [facility]) 0.25 MG tablet Take 0.25-0.5 mg by mouth 2 (two) times daily as needed for anxiety.   Yes [provider]  cyclobenzaprine (FLEXERIL) 10 MG tablet Take 10 mg by mouth 2 (two) times daily as needed for muscle spasms (muscle spasms). Take 1 tablet (10 mg) daily at bedtime, may take an additional tablet during the day as needed for muscle spasms   Yes [provider]  DULoxetine (CYMBALTA) 60 MG capsule TAKE ONE CAPSULE EACH NIGHT AT BEDTIME * Patient taking differently: Take 60 mg by mouth at bedtime.  10/09/16  Yes Lucille Passy, MD  estrogens, conjugated, (PREMARIN) 0.3 MG tablet Take 1 tablet (0.3 mg total) daily by mouth. Take daily for 21 days  then do not take for 7 days. 07/13/17  Yes Lucille Passy, MD  fluticasone [facility]) 50 MCG/ACT nasal spray USE 2 SPRAYS INTO EACH NOSTRIL ONCE DAILY AS DIRECTED. 06/10/17  Yes Lucille Passy, MD  HYDROcodone-acetaminophen (NORCO/VICODIN) 5-325 MG tablet Take 1-2 tablets by mouth every 4 (four) hours as needed (breakthrough pain). Patient taking differently: Take 1-2 tablets by mouth every 4 (four) hours as needed for moderate pain.  12/23/16  Yes Earnie Larsson, MD  hydrOXYzine (ATARAX/VISTARIL) 25  MG tablet Take 1-2 tablets (25-50 mg total) by mouth every 6 (six) hours as needed for itching. 12/23/16  Yes Pool, Henry, MD  ipratropium-albuterol (DUONEB) 0.5-2.5 (3) MG/3ML SOLN Take 3 mLs by nebulization every 4 (four) hours as needed. Patient taking differently: Take 3 mLs by nebulization every 4 (four) hours as needed (shortness of breath/wheezing).  01/04/13  Yes Aron, Talia M, MD  losartan (COZAAR) 50 MG tablet TAKE 1 TABLET BY MOUTH DAILY 08/03/17  Yes Aron, Talia M, MD  mometasone-formoterol (DULERA) 200-5 MCG/ACT AERO Inhale 2 puffs into the lungs 2 (two) times daily. Patient taking differently: Inhale 2 puffs into the lungs daily.  08/27/16  Yes John, James W, MD  promethazine (PHENERGAN) 12.5 MG tablet Take 1 tablet by mouth every 6 (six) hours as needed for nausea or vomiting.  06/24/17  Yes [provider]  tetrahydrozoline-zinc (VISINE-AC) 0.05-0.25 % ophthalmic solution Place 2 drops into both eyes 3 (three) times daily as needed (FOR ALLERGIES).   Yes [provider]  triamterene-hydrochlorothiazide (MAXZIDE-25) 37.5-25 MG tablet TAKE 1 TABLET BY MOUTH DAILY 08/03/17  Yes Aron, Talia M, MD  zolpidem (AMBIEN) 10 MG tablet TAKE ONE TABLET BY MOUTH EVERY NIGHT AT BEDTIME 06/10/17  Yes Aron, Talia M, MD     Results for orders placed or performed during the hospital encounter of 08/05/17 (from the past 48 hour(s))  Urinalysis, Routine w reflex microscopic     Status: Abnormal    Collection Time: 08/05/17  6:04 AM  Result Value Ref Range   Color, Urine YELLOW YELLOW   APPearance HAZY (A) CLEAR   Specific Gravity, Urine 1.017 1.005 - 1.030   pH 5.0 5.0 - 8.0   Glucose, UA NEGATIVE NEGATIVE mg/dL   Hgb urine dipstick NEGATIVE NEGATIVE   Bilirubin Urine NEGATIVE NEGATIVE   Ketones, ur NEGATIVE NEGATIVE mg/dL   Protein, ur NEGATIVE NEGATIVE mg/dL   Nitrite NEGATIVE NEGATIVE   Leukocytes, UA MODERATE (A) NEGATIVE   RBC / HPF 0-5 0 - 5 RBC/hpf   WBC, UA 6-30 0 - 5 WBC/hpf   Bacteria, UA MANY (A) NONE SEEN   Squamous Epithelial / LPF 0-5 (A) NONE SEEN   Mucus PRESENT    Hyaline Casts, UA PRESENT   CBC with Differential     Status: Abnormal   Collection Time: 08/05/17  6:26 AM  Result Value Ref Range   WBC 12.4 (H) 4.0 - 10.5 K/uL   RBC 4.62 3.87 - 5.11 MIL/uL   Hemoglobin 14.1 12.0 - 15.0 g/dL   HCT 42.0 36.0 - 46.0 %   MCV 90.9 78.0 - 100.0 fL   MCH 30.5 26.0 - 34.0 pg   MCHC 33.6 30.0 - 36.0 g/dL   RDW 13.0 11.5 - 15.5 %   Platelets 285 150 - 400 K/uL   Neutrophils Relative % 77 %   Neutro Abs 9.7 (H) 1.7 - 7.7 K/uL   Lymphocytes Relative 14 %   Lymphs Abs 1.7 0.7 - 4.0 K/uL   Monocytes Relative 8 %   Monocytes Absolute 0.9 0.1 - 1.0 K/uL   Eosinophils Relative 1 %   Eosinophils Absolute 0.1 0.0 - 0.7 K/uL   Basophils Relative 0 %   Basophils Absolute 0.1 0.0 - 0.1 K/uL  Basic metabolic panel     Status: Abnormal   Collection Time: 08/05/17  6:26 AM  Result Value Ref Range   Sodium 137 135 - 145 mmol/L   Potassium 3.4 (L) 3.5 - 5.1 mmol/L     Chloride 102 101 - 111 mmol/L   CO2 26 22 - 32 mmol/L   Glucose, Bld 122 (H) 65 - 99 mg/dL   BUN 18 6 - 20 mg/dL   Creatinine, Ser 0.77 0.44 - 1.00 mg/dL   Calcium 9.4 8.9 - 10.3 mg/dL   GFR calc non Af Amer >60 >60 mL/min   GFR calc Af Amer >60 >60 mL/min    Comment: (NOTE) The eGFR has been calculated using the CKD EPI equation. This calculation has not been validated in all clinical situations. eGFR's  persistently <60 mL/min signify possible Chronic Kidney Disease.    Anion gap 9 5 - 15  Lipase, blood     Status: None   Collection Time: 08/05/17  6:26 AM  Result Value Ref Range   Lipase 21 11 - 51 U/L  Hepatic function panel     Status: None   Collection Time: 08/05/17  6:26 AM  Result Value Ref Range   Total Protein 7.3 6.5 - 8.1 g/dL   Albumin 4.0 3.5 - 5.0 g/dL   AST 23 15 - 41 U/L   ALT 17 14 - 54 U/L   Alkaline Phosphatase 103 38 - 126 U/L   Total Bilirubin 0.9 0.3 - 1.2 mg/dL   Bilirubin, Direct 0.1 0.1 - 0.5 mg/dL   Indirect Bilirubin 0.8 0.3 - 0.9 mg/dL  Wet prep, genital     Status: Abnormal   Collection Time: 08/05/17  6:38 AM  Result Value Ref Range   Yeast Wet Prep HPF POC NONE SEEN NONE SEEN   Trich, Wet Prep NONE SEEN NONE SEEN   Clue Cells Wet Prep HPF POC NONE SEEN NONE SEEN   WBC, Wet Prep HPF POC RARE (A) NONE SEEN   Sperm NONE SEEN   I-Stat CG4 Lactic Acid, ED     Status: Abnormal   Collection Time: 08/05/17  6:47 AM  Result Value Ref Range   Lactic Acid, Venous 2.01 (HH) 0.5 - 1.9 mmol/L   Comment NOTIFIED PHYSICIAN   I-Stat CG4 Lactic Acid, ED     Status: None   Collection Time: 08/05/17  8:26 AM  Result Value Ref Range   Lactic Acid, Venous 0.85 0.5 - 1.9 mmol/L   Ct Abdomen Pelvis W Contrast  Result Date: 08/05/2017 CLINICAL DATA:  Right lower quadrant abdominal pain.  Nausea. EXAM: CT ABDOMEN AND PELVIS WITH CONTRAST TECHNIQUE: Multidetector CT imaging of the abdomen and pelvis was performed using the standard protocol following bolus administration of intravenous contrast. CONTRAST:  100 cc Isovue-300 IV COMPARISON:  10/20/2016 FINDINGS: Lower chest: No acute abnormality. Collapsed bilateral breast implants partially imaged. Hepatobiliary: Large cysts again noted in the right hepatic lobe measuring up to 7 cm, unchanged. Other smaller scattered cysts. Prior cholecystectomy. No biliary ductal dilatation. Pancreas: No focal abnormality or ductal  dilatation. Spleen: No focal abnormality.  Normal size. Adrenals/Urinary Tract: No adrenal abnormality. No focal renal abnormality. No stones or hydronephrosis. Urinary bladder is unremarkable. Stomach/Bowel: Stomach, large and small bowel grossly unremarkable. Appendix: Location: Extends inferior and medial to the cecum in the right lateral pelvis. Diameter: 11 mm Appendicolith: Small appendicoliths at the base of the appendix and in the mid to distal appendix Mucosal hyper-enhancement: Present Extraluminal gas: Absent Periappendiceal collection: Stranding, no focal fluid collection. Vascular/Lymphatic: No evidence of aneurysm or adenopathy. Reproductive: Prior hysterectomy. Small cyst in the left ovary measures 2.6 cm, stable. Right ovary unremarkable. Other: No free fluid or free air. Musculoskeletal: No acute bony abnormality.   IMPRESSION: Changes of acute appendicitis with dilated, inflamed appendix and small appendicoliths as above. Stable hepatic cysts. Electronically Signed   By: Kevin  Dover M.D.   On: 08/05/2017 09:55    Review of Systems  Constitutional: Positive for chills. Negative for diaphoresis, fever, malaise/fatigue and weight loss.  HENT: Negative.   Eyes: Negative.   Respiratory: Negative.   Cardiovascular: Negative.   Gastrointestinal: Positive for abdominal pain (Right lower quadrant pain point of discomfort.), diarrhea and nausea. Negative for blood in stool, constipation, heartburn, melena and vomiting.  Genitourinary: Negative.   Musculoskeletal: Positive for back pain (She has chronic back pain) and joint pain (She just had she just had a right rotator cuff and left knee repair.  She has arthritis in all her joints.). Negative for falls.  Skin: Negative.   Neurological: Negative.  Negative for weakness.  Endo/Heme/Allergies: Negative.   Psychiatric/Behavioral: Negative.     Blood pressure (!) 155/61, pulse (!) 114, temperature 98.9 F (37.2 C), temperature source Oral,  resp. rate 16, last menstrual period 12/21/2011, SpO2 95 %. Physical Exam  Constitutional: She is oriented to person, place, and time. She appears well-developed and well-nourished. No distress.  HENT:  Head: Normocephalic and atraumatic.  Mouth/Throat: No oropharyngeal exudate.  Eyes: Right eye exhibits no discharge. Left eye exhibits no discharge. No scleral icterus.  Pupils are equal  Neck: Normal range of motion. Neck supple. No JVD present. No tracheal deviation present. No thyromegaly present.  Cardiovascular: Normal rate, regular rhythm and intact distal pulses.  Murmur heard. Respiratory: Effort normal and breath sounds normal. No respiratory distress. She has no wheezes. She has no rales. She exhibits no tenderness.  GI: Soft. She exhibits no distension and no mass. There is tenderness (Right lower side she has one particular point is most severe in the right lower quadrant.). There is no rebound and no guarding.  Musculoskeletal: She exhibits no edema or tenderness.  Lymphadenopathy:    She has no cervical adenopathy.  Neurological: She is alert and oriented to person, place, and time. No cranial nerve deficit.  Skin: Skin is warm and dry. No rash noted. She is not diaphoretic. No erythema. No pallor.  Psychiatric: She has a normal mood and affect. Her behavior is normal. Judgment and thought content normal.     Assessment/Plan Acute appendicitis Asthma Arthritis Chronic back pain with degenerative disc disease. 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  Plan: Admit, IV fluids, IV antibiotics, scheduled for laparoscopic appendectomy today..  ,, PA-C 08/05/2017, 10:51 AM   

## 2017-08-05 NOTE — Anesthesia Postprocedure Evaluation (Signed)
Anesthesia Post Note  Patient: Melissa Allen  Procedure(s) Performed: APPENDECTOMY LAPAROSCOPIC (N/A )     Patient location during evaluation: PACU Anesthesia Type: General Level of consciousness: awake, sedated and oriented Pain management: pain level controlled Vital Signs Assessment: post-procedure vital signs reviewed and stable Respiratory status: spontaneous breathing, nonlabored ventilation, respiratory function stable and patient connected to nasal cannula oxygen Cardiovascular status: blood pressure returned to baseline and stable Postop Assessment: no apparent nausea or vomiting Anesthetic complications: no    Last Vitals:  Vitals:   08/05/17 1515 08/05/17 1530  BP: 107/70 112/61  Pulse: (!) 116 (!) 108  Resp: 15 14  Temp: 36.9 C 37.1 C  SpO2: 94% 100%    Last Pain:  Vitals:   08/05/17 1515  TempSrc:   PainSc: 3                  Dagmawi Venable,JAMES TERRILL

## 2017-08-05 NOTE — Discharge Instructions (Signed)
Laparoscopic Appendectomy, Adult, Care After °These instructions give you information about caring for yourself after your procedure. Your doctor may also give you more specific instructions. Call your doctor if you have any problems or questions after your procedure. °Follow these instructions at home: °Medicines °· Take over-the-counter and prescription medicines only as told by your doctor. °· Do not drive for 24 hours if you received a sedative. °· Do not drive or use heavy machinery while taking prescription pain medicine. °· If you were prescribed an antibiotic medicine, take it as told by your doctor. Do not stop taking it even if you start to feel better. °Activity °· Do not lift anything that is heavier than 10 pounds (4.5 kg) for 3 weeks or as told by your doctor. °· Do not play contact sports for 3 weeks or as told by your doctor. °· Slowly return to your normal activities. °Bathing °· Keep your cuts from surgery (incisions) clean and dry. °? Gently wash the cuts with soap and water. °? Rinse the cuts with water until the soap is gone. °? Pat the cuts dry with a clean towel. Do not rub the cuts. °· You may take showers after 48 hours. °· Do not take baths, swim, or use a hot tub for 2 weeks or as told by your doctor. °Cut Care °· Follow instructions from your doctor about how to take care of your cuts. Make sure you: °? Wash your hands with soap and water before you change your bandage (dressing). If you do not have soap and water, use hand sanitizer. °? Change your bandage as told by your doctor. °? Leave stitches (sutures), skin glue, or skin tape (adhesive) strips in place. They may need to stay in place for 2 weeks or longer. If tape strips get loose and curl up, you may trim the loose edges. Do not remove tape strips completely unless your doctor says it is okay. °· Check your cuts every day for signs of infection. Check for: °? More redness, swelling, or pain. °? More fluid or  blood. °? Warmth. °? Pus or a bad smell. °Other Instructions °· If you were sent home with a drain, follow instructions from your doctor about how to use it and care for it. °· Take deep breaths. This helps to keep your lungs from getting swollen (inflamed). °· To help with constipation: °? Drink plenty of fluids. °? Eat plenty of fruits and vegetables. °· Keep all follow-up visits as told by your doctor. This is important. °Contact a doctor if: °· You have more redness, swelling, or pain around a cut from surgery. °· You have more fluid or blood coming from a cut. °· Your cut feels warm to the touch. °· You have pus or a bad smell coming from a cut or a bandage. °· The edges of a cut break open after the stitches have been taken out. °· You have pain in your shoulders that gets worse. °· You feel dizzy or you pass out (faint). °· You have shortness of breath. °· You keep feeling sick to your stomach (nauseous). °· You keep throwing up (vomiting). °· You get diarrhea or you cannot control your poop. °· You lose your appetite. °· You have swelling or pain in your legs. °Get help right away if: °· You have a fever. °· You get a rash. °· You have trouble breathing. °· You have sharp pains in your chest. °This information is not intended to replace advice given   to you by your health care provider. Make sure you discuss any questions you have with your health care provider. °Document Released: 06/21/2009 Document Revised: 01/31/2016 Document Reviewed: 02/12/2015 °Elsevier Interactive Patient Education © 2018 [employer] ° °CCS ______[facility] °LAPAROSCOPIC SURGERY: POST OP INSTRUCTIONS °Always review your discharge instruction sheet given to you by the facility where your surgery was performed. °IF YOU HAVE DISABILITY OR FAMILY LEAVE FORMS, YOU MUST BRING THEM TO THE OFFICE FOR PROCESSING.   °DO NOT GIVE THEM TO YOUR DOCTOR. ° °1. A prescription for pain medication may be given to you upon  discharge.  Take your pain medication as prescribed, if needed.  If narcotic pain medicine is not needed, then you may take acetaminophen (Tylenol) or ibuprofen (Advil) as needed. °2. Take your usually prescribed medications unless otherwise directed. °3. If you need a refill on your pain medication, please contact your pharmacy.  They will contact our office to request authorization. Prescriptions will not be filled after 5pm or on week-ends. °4. You should follow a light diet the first few days after arrival home, such as soup and crackers, etc.  Be sure to include lots of fluids daily. °5. Most patients will experience some swelling and bruising in the area of the incisions.  Ice packs will help.  Swelling and bruising can take several days to resolve.  °6. It is common to experience some constipation if taking pain medication after surgery.  Increasing fluid intake and taking a stool softener (such as Colace) will usually help or prevent this problem from occurring.  A mild laxative (Milk of Magnesia or Miralax) should be taken according to package instructions if there are no bowel movements after 48 hours. °7. Unless discharge instructions indicate otherwise, you may remove your bandages 24-48 hours after surgery, and you may shower at that time.  You may have steri-strips (small skin tapes) in place directly over the incision.  These strips should be left on the skin for 7-10 days.  If your surgeon used skin glue on the incision, you may shower in 24 hours.  The glue will flake off over the next 2-3 weeks.  Any sutures or staples will be removed at the office during your follow-up visit. °8. ACTIVITIES:  You may resume regular (light) daily activities beginning the next day--such as daily self-care, walking, climbing stairs--gradually increasing activities as tolerated.  You may have sexual intercourse when it is comfortable.  Refrain from any heavy lifting or straining until approved by your doctor. °a. You  may drive when you are no longer taking prescription pain medication, you can comfortably wear a seatbelt, and you can safely maneuver your car and apply brakes. °b. RETURN TO WORK:  __________________________________________________________ °9. You should see your doctor in the office for a follow-up appointment approximately 2-3 weeks after your surgery.  Make sure that you call for this appointment within a day or two after you arrive home to insure a convenient appointment time. °10. OTHER INSTRUCTIONS: __________________________________________________________________________________________________________________________ __________________________________________________________________________________________________________________________ °WHEN TO CALL YOUR DOCTOR: °1. Fever over 101.0 °2. Inability to urinate °3. Continued bleeding from incision. °4. Increased pain, redness, or drainage from the incision. °5. Increasing abdominal pain ° °The clinic staff is available to answer your questions during regular business hours.  Please don’t hesitate to call and ask to speak to one of the nurses for clinical concerns.  If you have a medical emergency, go to the nearest emergency room or call 911.  A surgeon from   [facility] is always on call at the hospital. °[address], [address], [place], [place]  [address] ? [address] [address], [place], [place]   [address] °[phone] ? [phone] ? FAX [phone] °Web site: [email or web address] ° °

## 2017-08-05 NOTE — ED Triage Notes (Signed)
Pt arriving from home with complaints of right lower quadrant abdominal pain/tenderness and nausea that began last night. Pt has hx of kidney stones. Pt A&O x 4.

## 2017-08-05 NOTE — Anesthesia Procedure Notes (Signed)
Procedure Name: Intubation Date/Time: 08/05/2017 1:26 PM Performed by: Lissa Morales, CRNA Pre-anesthesia Checklist: Patient identified, Emergency Drugs available, Suction available and Patient being monitored Patient Re-evaluated:Patient Re-evaluated prior to induction Oxygen Delivery Method: Circle system utilized Preoxygenation: Pre-oxygenation with 100% oxygen Induction Type: IV induction Ventilation: Mask ventilation without difficulty Laryngoscope Size: Mac and 4 Grade View: Grade II Tube type: Oral Tube size: 7.0 mm Number of attempts: 1 Airway Equipment and Method: Stylet and Oral airway Placement Confirmation: ETT inserted through vocal cords under direct vision,  positive ETCO2 and breath sounds checked- equal and bilateral Secured at: 22 cm Tube secured with: Tape Dental Injury: Teeth and Oropharynx as per pre-operative assessment  Difficulty Due To: Difficult Airway- due to large tongue and Difficult Airway- due to limited oral opening

## 2017-08-05 NOTE — Transfer of Care (Signed)
Immediate Anesthesia Transfer of Care Note  Patient: Melissa Allen  Procedure(s) Performed: APPENDECTOMY LAPAROSCOPIC (N/A )  Patient Location: PACU  Anesthesia Type:General  Level of Consciousness: awake, alert , oriented and patient cooperative  Airway & Oxygen Therapy: Patient Spontanous Breathing and Patient connected to face mask oxygen  Post-op Assessment: Report given to RN, Post -op Vital signs reviewed and stable and Patient moving all extremities X 4  Post vital signs: stable  Last Vitals:  Vitals:   08/05/17 1220 08/05/17 1417  BP: 136/77 (!) 132/59  Pulse: (!) 105 (!) 110  Resp: 16 15  Temp: 37.2 C 37.4 C  SpO2: 95% 98%    Last Pain:  Vitals:   08/05/17 1417  TempSrc:   PainSc: 0-No pain      Patients Stated Pain Goal: 4 (53/66/44 0347)  Complications: No apparent anesthesia complications

## 2017-08-05 NOTE — ED Notes (Signed)
I gave critical I stat Lactic result to [facility]

## 2017-08-05 NOTE — ED Provider Notes (Signed)
[facility] DEPT Provider Note   CSN: [id number] Arrival date & time: 08/05/17  0536     History   Chief Complaint Chief Complaint  Patient presents with  . Flank Pain    HPI Melissa Allen is a 51 y.o. female with history of kidney stones requiring retrieval and stents, uterine fibroids status post hysterectomy, pancreatitis, ulcers, IBS with diarrhea, cholecystectomy presents to ED for evaluation of gradually worsening, constant, "twisting" lower abdominal pain onset last night. Pain is worse in the suprapubic and right lower quadrant area, without radiation to the back or groin. Palpation of abdomen aggravates pain. Has taken tramadol for pain without relief. Associated symptoms include nausea and chills. Had a couple of days of dysuria last week. Was accompanying her spouse in the ED yesterday, had 2-3 episodes of nonbloody diarrhea. Last meal before symptoms started was check fell a, she is unsure if maybe it had too much grease and is causing her symptoms. States pain feels different than pain from previous kidney stones.   Denies fevers, vomiting, chest pain, shortness of breath, flank pain, hematuria, abnormal vaginal discharge or bleeding, melena, hematochezia.  HPI  Past Medical History:  Diagnosis Date  . Allergy   . Anginal pain ([place])    one episode 4-5 years ago-[facility] [place]-nonspecific-no problems since-  . Anxiety   . Arthritis    multiple areas of arthritis- DDD  . Asthma    recent admit to ER 01/11/12 for exac of asthma  . Calcaneal fracture   . Duplicated ureter, right   . Fibromyalgia   . Hypertension   . Kidney stones   . Obesity (BMI 30.0-34.9)   . Pancreatitis 1986  . PONV (postoperative nausea and vomiting)   . Poor venous access    hard to start iv-  . 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 Shortness of breath dyspnea   . Wears glasses      Patient Active Problem List   Diagnosis Date Noted  . Painful orthopaedic hardware ([place]) 12/22/2016  . Ureteric stone 10/02/2016  . Hot flashes 01/08/2016  . Spinal stenosis, lumbar region, 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  . Degenerative spondylolisthesis 05/01/2015  . Insomnia 09/30/2013  . HEADACHE 04/26/2010  . FIBROMYALGIA 01/25/2010  . Anxiety state 01/22/2010  . Depression, recurrent ([place]) 01/22/2010  . REFLUX GASTRITIS 01/22/2010  . HEPATIC CYST 01/22/2010  . NEPHROLITHIASIS, HX OF 01/22/2010  . Essential hypertension 01/01/2009  . Palpitations 01/11/2008  . Asthma 11/26/2007  . GERD 11/26/2007  . PANCREATITIS, CHRONIC 11/26/2007  . RENAL CALCULUS 11/26/2007  . Arthropathy 11/26/2007  . Allergic rhinitis 11/24/2007    Past Surgical History:  Procedure Laterality Date  . ABDOMINAL HYSTERECTOMY    . BLADDER SUSPENSION  03/01/2012   Procedure: TRANSVAGINAL TAPE (TVT) PROCEDURE;  Surgeon: Emily Filbert, MD;  Location: [facility];  Service: Gynecology;  Laterality: N/A;  . BUNIONECTOMY  1995   Right  . CHOLECYSTECTOMY  late 1980's      . CYSTOSCOPY  03/01/2012   Procedure: CYSTOSCOPY;  Surgeon: Emily Filbert, MD;  Location: [facility];  Service: Gynecology;  Laterality: N/A;  . CYSTOSCOPY W/ URETERAL STENT PLACEMENT Left 12/15/2014   Procedure: CYSTOSCOPY, LEFT URETEROSCOPYU WITH STONE EXTRACTION;  Surgeon: Irine Seal, MD;  Location: [facility];  Service: Urology;  Laterality: Left;  . CYSTOSCOPY W/ URETERAL STENT PLACEMENT Left 10/02/2016   Procedure: CYSTOSCOPY  WITH RETROGRADE PYELOGRAM/URETERAL STENT PLACEMENT ureteroscopy, basket extraction;  Surgeon: Cleon Gustin, MD;  Location: [facility];  Service: Urology;  Laterality: Left;  . HARDWARE REMOVAL N/A 12/22/2016   Procedure: Lumbar Four-Five Removal of Hardware;  Surgeon: Earnie Larsson, MD;  Location: [facility];  Service: Neurosurgery;  Laterality: N/A;  . INCISION AND DRAINAGE ABSCESS Left 07/19/2013   Procedure: INCISION AND  DRAINAGE LEFT LOWER EXTERMITY HEMATOMA;  Surgeon: Imogene Burn. Georgette Dover, MD;  Location: [facility];  Service: General;  Laterality: Left;  Excision left lower leg mass  . KNEE CARTILAGE SURGERY     left  . KNEE SURGERY Left    x 2, Murphy/Sue, corrected Patella displacement   . LAMINECTOMY    . LUMBAR LAMINECTOMY  10/06   L4-5, Ray  . R compressed pronator Right    Sypher, R arm  . RESECTION DISTAL CLAVICAL Right 06/30/2017   Procedure: RIGHT RESECTION DISTAL CLAVICAL;  Surgeon: Melrose Nakayama, MD;  Location: [facility];  Service: Orthopedics;  Laterality: Right;  . SHOULDER ACROMIOPLASTY Right 06/30/2017   Procedure: RIGHT SHOULDER ACROMIOPLASTY;  Surgeon: Melrose Nakayama, MD;  Location: [facility];  Service: Orthopedics;  Laterality: Right;  . SHOULDER ARTHROSCOPY Right 06/30/2017   Procedure: ARTHROSCOPY DEBRIDEMENT RIGHT SHOULDER;  Surgeon: Melrose Nakayama, MD;  Location: [facility];  Service: Orthopedics;  Laterality: Right;  . SPINAL FUSION    . TENDON REPAIR  2013  . TENDON REPAIR Right 02/01/12  . TONSILLECTOMY  age 84    OB History    Gravida Para Term Preterm AB Living   3 3 2 1  0 3   SAB TAB Ectopic Multiple Live Births   0 0 0 0         Home Medications    Prior to Admission medications   Medication Sig Start Date End Date Taking? Authorizing Provider  albuterol [facility] HFA) 108 (90 Base) MCG/ACT inhaler INHALE TWO PUFFS EVERY FOUR HOURS AS NEEDED SHORTNESS OF BREATH 08/27/16  Yes Biagio Borg, MD  ALPRAZolam Duanne Moron) 0.25 MG tablet Take 0.25-0.5 mg by mouth 2 (two) times daily as needed for anxiety.   Yes [provider]  cyclobenzaprine (FLEXERIL) 10 MG tablet Take 10 mg by mouth 2 (two) times daily as needed for muscle spasms (muscle spasms). Take 1 tablet (10 mg) daily at bedtime, may take an additional tablet during the day as needed for muscle spasms   Yes [provider]  DULoxetine (CYMBALTA) 60 MG capsule TAKE ONE CAPSULE EACH NIGHT AT BEDTIME  * Patient taking differently: Take 60 mg by mouth at bedtime.  10/09/16  Yes Lucille Passy, MD  estrogens, conjugated, (PREMARIN) 0.3 MG tablet Take 1 tablet (0.3 mg total) daily by mouth. Take daily for 21 days then do not take for 7 days. 07/13/17  Yes Lucille Passy, MD  fluticasone [facility]) 50 MCG/ACT nasal spray USE 2 SPRAYS INTO EACH NOSTRIL ONCE DAILY AS DIRECTED. 06/10/17  Yes Lucille Passy, MD  HYDROcodone-acetaminophen (NORCO/VICODIN) 5-325 MG tablet Take 1-2 tablets by mouth every 4 (four) hours as needed (breakthrough pain). Patient taking differently: Take 1-2 tablets by mouth every 4 (four) hours as needed for moderate pain.  12/23/16  Yes Pool, Mallie Mussel, MD  hydrOXYzine (ATARAX/VISTARIL) 25 MG tablet Take 1-2 tablets (25-50 mg total) by mouth every 6 (six) hours as needed for itching. 12/23/16  Yes Pool, Mallie Mussel, MD  ipratropium-albuterol (DUONEB) 0.5-2.5 (3) MG/3ML SOLN Take 3 mLs by nebulization every 4 (four) hours  as needed. Patient taking differently: Take 3 mLs by nebulization every 4 (four) hours as needed (shortness of breath/wheezing).  01/04/13  Yes Lucille Passy, MD  losartan (COZAAR) 50 MG tablet TAKE 1 TABLET BY MOUTH DAILY 08/03/17  Yes Lucille Passy, MD  mometasone-formoterol [facility]) 200-5 MCG/ACT AERO Inhale 2 puffs into the lungs 2 (two) times daily. Patient taking differently: Inhale 2 puffs into the lungs daily.  08/27/16  Yes Biagio Borg, MD  promethazine (PHENERGAN) 12.5 MG tablet Take 1 tablet by mouth every 6 (six) hours as needed for nausea or vomiting.  06/24/17  Yes [provider]  tetrahydrozoline-zinc (VISINE-AC) 0.05-0.25 % ophthalmic solution Place 2 drops into both eyes 3 (three) times daily as needed (FOR ALLERGIES).   Yes [provider]  triamterene-hydrochlorothiazide (MAXZIDE-25) 37.5-25 MG tablet TAKE 1 TABLET BY MOUTH DAILY 08/03/17  Yes Lucille Passy, MD  zolpidem (AMBIEN) 10 MG tablet TAKE ONE TABLET BY MOUTH EVERY NIGHT AT BEDTIME  06/10/17  Yes Lucille Passy, MD    Family History Family History  Problem Relation Age of Onset  . Cancer Mother        pancreatic cancer  . Heart disease Father 48  . Colon polyps Father   . Colon cancer Paternal Grandfather   . Colon cancer Maternal Grandmother   . Uterine cancer Unknown        Grandmother  . Breast cancer Unknown        Grandmother  . Ovarian cancer Unknown        Grandmother  . Diabetes Unknown        mother  . Other Neg Hx     Social History Social History   Tobacco Use  . Smoking status: Never Smoker  . Smokeless tobacco: Never Used  Substance Use Topics  . Alcohol use: Yes    Alcohol/week: 3.5 oz    Types: 7 Standard drinks or equivalent per week    Comment: Occasional- wine or liquor twice a week  . Drug use: No     Allergies   Latex; Lisinopril; Codeine; Dilaudid [hydromorphone hcl]; Hydrocodone; and Tape   Review of Systems Review of Systems  Constitutional: Positive for chills.  Gastrointestinal: Positive for abdominal pain, diarrhea and nausea.     Physical Exam Updated Vital Signs BP (!) 155/61 (BP Location: Left Arm)   Pulse (!) 114   Temp 98.9 F (37.2 C) (Oral)   Resp 16   LMP 12/21/2011   SpO2 95%   Physical Exam  Constitutional: She is oriented to person, place, and time. She appears well-developed and well-nourished. No distress.  NAD.  HENT:  Head: Normocephalic and atraumatic.  Right Ear: External ear normal.  Left Ear: External ear normal.  Nose: Nose normal.  Moist mucous membranes   Eyes: Conjunctivae and EOM are normal. No scleral icterus.  Neck: Normal range of motion. Neck supple.  Cardiovascular: Normal rate, regular rhythm and normal heart sounds.  No murmur heard. Pulmonary/Chest: Effort normal and breath sounds normal. She has no wheezes.  Abdominal: Soft. Bowel sounds are normal. There is tenderness.  Focal tenderness to RLQ and suprapubic area. Positive McBurney's.  No G/R/R No CVAT    Genitourinary:  Genitourinary Comments:  Chaperone was present.  External genitalia normal without erythema, edema, tenderness, discharge or lesions.  No groin lymphadenopathy.  Vaginal mucosa normal, pink without discharge or lesions.  No cervix visualized (s/p hysterectomy).  Non tender, non palpable adnexa.  Patient with no  pain in perianal/rectal area with palpation.  There are no external fissures or external hemorrhoids noted.  No induration or swelling of the perianal skin.   Stool color is brown with no gross blood noted.   No signs of perirectal abscess.    Musculoskeletal: Normal range of motion. She exhibits no deformity.  Neurological: She is alert and oriented to person, place, and time.  Skin: Skin is warm and dry. Capillary refill takes less than 2 seconds.  Psychiatric: She has a normal mood and affect. Her behavior is normal. Judgment and thought content normal.  Nursing note and vitals reviewed.    ED Treatments / Results  Labs (all labs ordered are listed, but only abnormal results are displayed) Labs Reviewed  WET PREP, GENITAL - Abnormal; Notable for the following components:      Result Value   WBC, Wet Prep HPF POC RARE (*)    All other components within normal limits  CBC WITH DIFFERENTIAL/PLATELET - Abnormal; Notable for the following components:   WBC 12.4 (*)    Neutro Abs 9.7 (*)    All other components within normal limits  BASIC METABOLIC PANEL - Abnormal; Notable for the following components:   Potassium 3.4 (*)    Glucose, Bld 122 (*)    All other components within normal limits  URINALYSIS, ROUTINE W REFLEX MICROSCOPIC - Abnormal; Notable for the following components:   APPearance HAZY (*)    Leukocytes, UA MODERATE (*)    Bacteria, UA MANY (*)    Squamous Epithelial / LPF 0-5 (*)    All other components within normal limits  I-STAT CG4 LACTIC ACID, ED - Abnormal; Notable for the following components:   Lactic Acid, Venous 2.01 (*)     All other components within normal limits  LIPASE, BLOOD  HEPATIC FUNCTION PANEL  I-STAT CG4 LACTIC ACID, ED  POC OCCULT BLOOD, ED  GC/CHLAMYDIA PROBE AMP ([place]) NOT AT [facility]    EKG  EKG Interpretation None       Radiology Ct Abdomen Pelvis W Contrast  Result Date: 08/05/2017 CLINICAL DATA:  Right lower quadrant abdominal pain.  Nausea. EXAM: CT ABDOMEN AND PELVIS WITH CONTRAST TECHNIQUE: Multidetector CT imaging of the abdomen and pelvis was performed using the standard protocol following bolus administration of intravenous contrast. CONTRAST:  100 cc Isovue-300 IV COMPARISON:  10/20/2016 FINDINGS: Lower chest: No acute abnormality. Collapsed bilateral breast implants partially imaged. Hepatobiliary: Large cysts again noted in the right hepatic lobe measuring up to 7 cm, unchanged. Other smaller scattered cysts. Prior cholecystectomy. No biliary ductal dilatation. Pancreas: No focal abnormality or ductal dilatation. Spleen: No focal abnormality.  Normal size. Adrenals/Urinary Tract: No adrenal abnormality. No focal renal abnormality. No stones or hydronephrosis. Urinary bladder is unremarkable. Stomach/Bowel: Stomach, large and small bowel grossly unremarkable. Appendix: Location: Extends inferior and medial to the cecum in the right lateral pelvis. Diameter: 11 mm Appendicolith: Small appendicoliths at the base of the appendix and in the mid to distal appendix Mucosal hyper-enhancement: Present Extraluminal gas: Absent Periappendiceal collection: Stranding, no focal fluid collection. Vascular/Lymphatic: No evidence of aneurysm or adenopathy. Reproductive: Prior hysterectomy. Small cyst in the left ovary measures 2.6 cm, stable. Right ovary unremarkable. Other: No free fluid or free air. Musculoskeletal: No acute bony abnormality. IMPRESSION: Changes of acute appendicitis with dilated, inflamed appendix and small appendicoliths as above. Stable hepatic cysts. Electronically Signed   By:  Rolm Baptise M.D.   On: 08/05/2017 09:55    Procedures Procedures (  including critical care time)  Medications Ordered in ED Medications  sodium chloride 0.9 % bolus 1,000 mL (0 mLs Intravenous Stopped 08/05/17 0800)  ondansetron (ZOFRAN) injection 4 mg (4 mg Intravenous Given 08/05/17 0648)  morphine 4 MG/ML injection 4 mg (4 mg Intravenous Given 08/05/17 0645)  diphenhydrAMINE (BENADRYL) injection 12.5 mg (12.5 mg Intravenous Given 08/05/17 0645)  HYDROmorphone (DILAUDID) injection 1 mg (1 mg Intravenous Given 08/05/17 0750)  iopamidol (ISOVUE-300) 61 % injection 100 mL (100 mLs Intravenous Contrast Given 08/05/17 0925)     Initial Impression / Assessment and Plan / ED Course  I have reviewed the triage vital signs and the nursing notes.  Pertinent labs & imaging results that were available during my care of the patient were reviewed by me and considered in my medical decision making (see chart for details).  Clinical Course as of Aug 06 1139  Wed Aug 05, 2017  0636 Leukocytes, UA: (!) MODERATE [CG]  0636 Bacteria, UA: (!) MANY [CG]  0962 Appearance: (!) HAZY [CG]  0740 WBC, Wet Prep HPF POC: (!) RARE [CG]  0740 WBC: (!) 12.4 [CG]  1054 Spoke to Dr Ronnald Ramp ([place]) who will evaluate patient  [CG]    Clinical Course User Index [CG] Kinnie Feil, PA-C   51 year old female presents with right lower quadrant pain and nausea, chills. On exam she has focal tenderness over right lower quadrant and suprapubic area. No peritoneal signs. No CVA tenderness. Afebrile. Borderline tachycardic. Pelvic exam performed, no adnexal tenderness on the right side. This raises suspicion for GI pathology over ovarian cyst/torsion.  Labwork remarkable for mild leukocytosis, urinary tract infection and elevated lactic acid. Patient received IV fluids, antiemetics and analgesia in the ED. Lactic acid normalized.  CT abdomen/pelvis shows appendicitis with appendicolith without perforation or abscess.  Discussed results with patient. Last by mouth intake 8 PM last night. Contacted general surgery who will evaluate patient in the emergency department.  Final Clinical Impressions(s) / ED Diagnoses   Per [facility] note pt will be admitted for OR today.  Final diagnoses:  Uncomplicated acute appendicitis    ED Discharge Orders    None       Arlean Hopping 08/05/17 1140    Lacretia Leigh, MD 08/10/17 [phone]

## 2017-08-05 NOTE — Anesthesia Preprocedure Evaluation (Signed)
Anesthesia Evaluation  Patient identified by MRN, date of birth, ID band Patient awake    History of Anesthesia Complications (+) PONV  Airway Mallampati: III  TM Distance: <3 FB     Dental  (+) Teeth Intact   Pulmonary shortness of breath, asthma ,    breath sounds clear to auscultation       Cardiovascular hypertension,  Rhythm:Regular Rate:Normal     Neuro/Psych  Neuromuscular disease    GI/Hepatic Neg liver ROS, GERD  ,  Endo/Other    Renal/GU Renal disease     Musculoskeletal  (+) Arthritis , Fibromyalgia -Several ortho surgeries   Abdominal (+) + obese,   Peds  Hematology   Anesthesia Other Findings   Reproductive/Obstetrics                             Anesthesia Physical Anesthesia Plan  ASA: II  Anesthesia Plan: General   Post-op Pain Management:    Induction: Intravenous  PONV Risk Score and Plan: 4 or greater and Treatment may vary due to age or medical condition, Ondansetron, Dexamethasone and Midazolam  Airway Management Planned: Oral ETT  Additional Equipment:   Intra-op Plan:   Post-operative Plan: Extubation in OR  Informed Consent: I have reviewed the patients History and Physical, chart, labs and discussed the procedure including the risks, benefits and alternatives for the proposed anesthesia with the patient or authorized representative who has indicated his/her understanding and acceptance.   Dental advisory given  Plan Discussed with: CRNA  Anesthesia Plan Comments:         Anesthesia Quick Evaluation

## 2017-08-05 NOTE — Op Note (Signed)
Preoperative diagnosis: acute appendicitis  Postoperative diagnosis: Same   Procedure: laparoscopic appendectomy  Surgeon: Gurney Maxin, M.D.  Asst: none  Anesthesia: Gen.   Indications for procedure: Melissa Allen is a 51 y.o. female with symptoms of pain in right lower quadrant and nausea consistent with acute appendicitis. Confirmed by CT scan and laboratory values.  Description of procedure: The patient was brought into the operative suite, placed supine. Anesthesia was administered with endotracheal tube. The patient's left arm was tucked. All pressure points were offloaded by foam padding. The patient was prepped and draped in the usual sterile fashion.  A transverse incision was made to the left of the umbilicus and a 38mm trocar was [place]. Pneumoperitoneum was applied with high flow low pressure.  2 72mm trocars were placed, one in the suprapubic space, one in the LLQ, the periumbilical incision was then up-sized and a 60mm trocar placed in that space. All trocars sites were first anesthesized with 0.25% marcaine with epinephrine. Next the patient was placed in trendelenberg, rotated to the left. The omentum was retracted cephalad. The cecum and appendix were identified. The appendix appeared inflamed, the tip was firm, and there was no evidence of perforation. The base of the appendix was dissected and a window through the mesoappendix was created with blunt dissection. Large Hem-o-lock clips were used to doubly ligate the base of the appendix and mesoappendix. The appendix was cut free with scissors. 39ml of 0.5% marcaine was infused into the left and right lower abdominal preperitoneal space.  The appendix was placed in a specimen bag. The pelvis and RLQ were irrigated. The right ovary was visualized and normal, the left ovary was visualized and had 2 simple cysts. The appendix was removed via the umbilicus. 0 vicryl was used to close the fascial defect. Pneumoperitoneum was removed, all  trocars were removed. All incisions were closed with 4-0 monocryl subcuticular stitch. The patient woke from anesthesia and was brought to PACU in stable condition.  Findings: acute appendicitis, left ovary with cysts  Specimen: appendix  Blood loss: <30 ml  Local anesthesia: 30 ml 7.9% marcaine  Complications: none  Gurney Maxin, M.D. General, Bariatric, & Minimally Invasive Surgery [facility]

## 2017-08-06 ENCOUNTER — Other Ambulatory Visit: Payer: Self-pay | Admitting: Family Medicine

## 2017-08-06 LAB — CBC WITH DIFFERENTIAL/PLATELET
Basophils Absolute: 0 10*3/uL (ref 0.0–0.1)
Basophils Relative: 0 %
EOS ABS: 0 10*3/uL (ref 0.0–0.7)
EOS PCT: 0 %
HCT: 38.7 % (ref 36.0–46.0)
Hemoglobin: 12.5 g/dL (ref 12.0–15.0)
LYMPHS ABS: 0.9 10*3/uL (ref 0.7–4.0)
LYMPHS PCT: 8 %
MCH: 30.1 pg (ref 26.0–34.0)
MCHC: 32.3 g/dL (ref 30.0–36.0)
MCV: 93.3 fL (ref 78.0–100.0)
MONO ABS: 0.7 10*3/uL (ref 0.1–1.0)
MONOS PCT: 6 %
Neutro Abs: 10.2 10*3/uL — ABNORMAL HIGH (ref 1.7–7.7)
Neutrophils Relative %: 86 %
PLATELETS: 214 10*3/uL (ref 150–400)
RBC: 4.15 MIL/uL (ref 3.87–5.11)
RDW: 13.1 % (ref 11.5–15.5)
WBC: 11.8 10*3/uL — AB (ref 4.0–10.5)

## 2017-08-06 LAB — GC/CHLAMYDIA PROBE AMP (~~LOC~~) NOT AT ARMC
CHLAMYDIA, DNA PROBE: NEGATIVE
NEISSERIA GONORRHEA: NEGATIVE

## 2017-08-06 LAB — HIV ANTIBODY (ROUTINE TESTING W REFLEX): HIV SCREEN 4TH GENERATION: NONREACTIVE

## 2017-08-06 MED ORDER — OXYCODONE HCL 5 MG PO TABS
5.0000 mg | ORAL_TABLET | Freq: Four times a day (QID) | ORAL | Status: DC | PRN
Start: 2017-08-06 — End: 2017-08-06
  Administered 2017-08-06: 5 mg via ORAL
  Filled 2017-08-06: qty 1

## 2017-08-06 MED ORDER — OXYCODONE HCL 5 MG PO TABS: 5.0000 mg | ORAL_TABLET | Freq: Four times a day (QID) | ORAL | 0 refills | Status: DC | PRN

## 2017-08-06 MED ORDER — IPRATROPIUM-ALBUTEROL 0.5-2.5 (3) MG/3ML IN SOLN: 3.0000 mL | RESPIRATORY_TRACT | Status: DC | PRN

## 2017-08-06 MED ORDER — OXYCODONE HCL 5 MG PO TABS
5.0000 mg | ORAL_TABLET | Freq: Four times a day (QID) | ORAL | Status: DC | PRN
Start: 2017-08-06 — End: 2017-08-06

## 2017-08-06 MED ORDER — IBUPROFEN 200 MG PO TABS: ORAL_TABLET | ORAL | Status: DC

## 2017-08-06 MED ORDER — TRAMADOL HCL 50 MG PO TABS: 50.0000 mg | ORAL_TABLET | Freq: Four times a day (QID) | ORAL | Status: DC | PRN

## 2017-08-06 MED ORDER — ACETAMINOPHEN 500 MG PO TABS: ORAL_TABLET | ORAL | 0 refills | Status: DC

## 2017-08-06 MED ORDER — MORPHINE SULFATE (PF) 2 MG/ML IV SOLN: 2.0000 mg | INTRAVENOUS | Status: DC | PRN

## 2017-08-06 MED ORDER — OXYCODONE HCL 5 MG PO TABS: 5.0000 mg | ORAL_TABLET | Freq: Four times a day (QID) | ORAL | Status: DC | PRN

## 2017-08-06 MED ORDER — IBUPROFEN 200 MG PO TABS: 600.0000 mg | ORAL_TABLET | Freq: Four times a day (QID) | ORAL | Status: DC | PRN

## 2017-08-06 MED ORDER — HYDROXYZINE HCL 25 MG PO TABS: 25.0000 mg | ORAL_TABLET | Freq: Four times a day (QID) | ORAL | Status: DC | PRN

## 2017-08-06 MED ORDER — ACETAMINOPHEN 500 MG PO TABS
1000.0000 mg | ORAL_TABLET | Freq: Three times a day (TID) | ORAL | Status: DC
  Administered 2017-08-06: 1000 mg via ORAL

## 2017-08-06 MED ORDER — SENNA 8.6 MG PO TABS
2.0000 | ORAL_TABLET | Freq: Every day | ORAL | Status: DC | PRN
Start: 2017-08-06 — End: 2017-08-06

## 2017-08-06 NOTE — Progress Notes (Signed)
08/06/17  1320  Reviewed discharge instructions with patient. Patient verbalized understanding of discharge instructions. Copy of discharge instructions and prescription given to patient.

## 2017-08-06 NOTE — Progress Notes (Signed)
1 Day Post-Op    UU:VOZDG appendicitis   Subjective: She had a bad night last evening she only wanted the IV pain medications.  She is doing better this a.m. and is eaten a good breakfast.  She is beginning to mobilize better.  She has had multiple surgeries this year including her right shoulder and left knee, which combined with her abdominal surgery made her pretty miserable last night.  Objective: Vital signs in last 24 hours: Temp:  [97.9 F (36.6 C)-99.4 F (37.4 C)] 97.9 F (36.6 C) (11/29 0508) Pulse Rate:  [92-117] 109 (11/29 0953) Resp:  [14-20] 20 (11/29 0953) BP: (107-136)/(54-89) 109/54 (11/29 0953) SpO2:  [94 %-100 %] 98 % (11/29 0953) Weight:  [89.8 kg (198 lb)] 89.8 kg (198 lb) (11/28 1302) Last BM Date: 08/04/17 4756 IV 700 urine Afebrile, VSS WBC improving    Intake/Output from previous day: 11/28 0701 - 11/29 0700 In: 4756.3 [I.V.:3756.3; IV Piggyback:1000] Out: 750 [Urine:700; Blood:50] Intake/Output this shift: Total I/O In: 490 [P.O.:240; I.V.:250] Out: 300 [Urine:300]  General appearance: alert, cooperative and no distress Resp: clear to auscultation bilaterally GI: Soft, pretty sore.  All the port sites look good.  She is tolerating a regular diet.  Has some issues with constipation.  Lab Results:  Recent Labs    08/05/17 0626 08/06/17 0951  WBC 12.4* 11.8*  HGB 14.1 12.5  HCT 42.0 38.7  PLT 285 214    BMET Recent Labs    08/05/17 0626  NA 137  K 3.4*  CL 102  CO2 26  GLUCOSE 122*  BUN 18  CREATININE 0.77  CALCIUM 9.4   PT/INR No results for input(s): LABPROT, INR in the last 72 hours.  Recent Labs  Lab 08/05/17 0626  AST 23  ALT 17  ALKPHOS 103  BILITOT 0.9  PROT 7.3  ALBUMIN 4.0     Lipase     Component Value Date/Time   LIPASE 21 08/05/2017 0626     Medications: . DULoxetine  60 mg Oral QHS  . enoxaparin (LOVENOX) injection  40 mg Subcutaneous Q24H  . fluticasone  1 spray Each Nare Daily  . losartan  50  mg Oral Daily  . triamterene-hydrochlorothiazide  1 tablet Oral Daily  . zolpidem  5 mg Oral QHS   . 0.9 % NaCl with KCl 20 mEq / L 10 mL/hr (08/06/17 0949)   Anti-infectives (From admission, onward)   Start     Dose/Rate Route Frequency Ordered Stop   08/05/17 1309  dextrose 5 % with cefTRIAXone (ROCEPHIN) ADS Med    Comments:  Melissa Allen   : cabinet override      08/05/17 1309 08/06/17 0114   08/05/17 1300  metroNIDAZOLE (FLAGYL) IVPB 500 mg  Status:  Discontinued     500 mg 100 mL/hr over 60 Minutes Intravenous Every 8 hours 08/05/17 1140 08/05/17 1537   08/05/17 1200  cefTRIAXone (ROCEPHIN) 2 g in dextrose 5 % 50 mL IVPB  Status:  Discontinued     2 g 100 mL/hr over 30 Minutes Intravenous Every 24 hours 08/05/17 1140 08/05/17 1537     Assessment/Plan Acute appendicitis Laparoscopic appendectomy 08/05/17, Dr. Gurney Maxin.  Asthma Arthritis Chronic back pain with degenerative disc disease. 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  FEN: regular diet ID: Rocephin/Flagyl pre op DVT: Lovenox Foley: Out Follow-up: [facility]   Plan: I have given her p.o. Tylenol and ibuprofen.  She understands we can only  give her 15 oxycodone tablets for discharge.  We will plan to mobilize her more and discharge after lunch.    LOS: 0 days    Melissa Allen 08/06/2017 [phone]

## 2017-08-06 NOTE — Plan of Care (Signed)
  Activity: Risk for activity intolerance will decrease 08/06/2017 0855 - Progressing by Dorene Sorrow, RN   Nutrition: Adequate nutrition will be maintained 08/06/2017 0855 - Progressing by Dorene Sorrow, RN

## 2017-08-06 NOTE — Discharge Summary (Signed)
Physician Discharge Summary  Patient ID: Melissa Allen MRN: [id number] DOB/AGE: 52-Oct-1967 52 y.o.  Admit date: 08/05/2017 Discharge date: 08/06/2017  Admission Diagnoses:  Acute appendicitis Asthma Arthritis Chronic back pain with degenerative disc disease. 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 Recent left knee and right shoulder surgery   Discharge Diagnoses:  Same   Active Problems:   Acute appendicitis   PROCEDURES: Laparoscopic appendectomy 08/05/17, Dr. Gurney Maxin.    Hospital Course:  Chief Complaint: Right lower quadrant pain HPI: 51 year old female presented from home with complaints of right lower quadrant pain and tenderness that began last evening.  Patient reports the pain started yesterday afternoon but became severe and kept her up all night.  She did have some loose stools with this.  She has a history of kidney stones and was concerned this might be the issue on admission.  Pains associated with nausea and chills.  No vomiting.  Workup in the ED shadows  She is afebrile heart rate is 114-118 range.  BP is stable.   CMP is normal except for a potassium of 3.4 and a glucose of 122.  Lipase was 21 AST 23 ALT is 17 total bilirubin is 0.9.  WBC is 12.4 hemoglobin 14, hematocrit 42 with normal platelets, 285,000.  CT scan shows appendicolith at the base of the appendix and the mid to distal appendix.  The appendix was 11 mm in diameter.  There was no fluid collection or perforation noted.  We are asked to see.  She was seen in the ED and taken to the OR later that day by Dr. Kieth Brightly.  She did well with the surgery, but had a hard evening post op with combined abdominal pain, shoulder pain and knee pain.  She was doing better in the AM, she was mobilized and we let her go home when she felt she was OK without IV pain medicines.    Condition on D/C:  Improved  CBC Latest Ref Rng & Units 08/06/2017  08/05/2017 06/30/2017  WBC 4.0 - 10.5 K/uL 11.8(H) 12.4(H) 5.3  Hemoglobin 12.0 - 15.0 g/dL 12.5 14.1 12.9  Hematocrit 36.0 - 46.0 % 38.7 42.0 39.0  Platelets 150 - 400 K/uL 214 285 181   CMP Latest Ref Rng & Units 08/05/2017 06/30/2017 12/15/2016  Glucose 65 - 99 mg/dL 122(H) 91 104(H)  BUN 6 - 20 mg/dL 18 10 9   Creatinine 0.44 - 1.00 mg/dL 0.77 0.55 0.63  Sodium 135 - 145 mmol/L 137 135 135  Potassium 3.5 - 5.1 mmol/L 3.4(L) 3.5 3.4(L)  Chloride 101 - 111 mmol/L 102 101 101  CO2 22 - 32 mmol/L 26 23 25   Calcium 8.9 - 10.3 mg/dL 9.4 8.7(L) 9.3  Total Protein 6.5 - 8.1 g/dL 7.3 - -  Total Bilirubin 0.3 - 1.2 mg/dL 0.9 - -  Alkaline Phos 38 - 126 U/L 103 - -  AST 15 - 41 U/L 23 - -  ALT 14 - 54 U/L 17 - -   Disposition: 01-Home or Self Care   Allergies as of 08/06/2017      Reactions   Latex Itching, Swelling, Dermatitis   SWELLING FROM FACE MASK RING AFTER LAST SURGERY   Lisinopril Cough      Codeine Nausea And Vomiting   Dilaudid [hydromorphone Hcl] Itching       Hydrocodone Itching   Tape Rash   TOLERATES PAPER TAPE ONLY NO PINK SURGICAL TAPE EITHER  Medication List    STOP taking these medications   HYDROcodone-acetaminophen 5-325 MG tablet Commonly known as:  NORCO/VICODIN     TAKE these medications   acetaminophen 500 MG tablet Commonly known as:  TYLENOL You can take up to 3 - 325 mg tablets every 8 hours as needed for pain. OR 2 - 500 mg tablets every 8 hours as needed for pain. You can buy both of these over-the-counter at any drugstore.  DO NOT TAKE MORE THAN 4000 MG OF TYLENOL PER DAY.  IT CAN HARM YOUR LIVER.   albuterol 108 (90 Base) MCG/ACT inhaler Commonly known as:  PROAIR HFA INHALE TWO PUFFS EVERY FOUR HOURS AS NEEDED SHORTNESS OF BREATH   ALPRAZolam 0.25 MG tablet Commonly known as:  XANAX Take 0.25-0.5 mg by mouth 2 (two) times daily as needed for anxiety.   cyclobenzaprine 10 MG tablet Commonly known as:  FLEXERIL Take 10 mg by  mouth 2 (two) times daily as needed for muscle spasms (muscle spasms). Take 1 tablet (10 mg) daily at bedtime, may take an additional tablet during the day as needed for muscle spasms   DULoxetine 60 MG capsule Commonly known as:  CYMBALTA TAKE ONE CAPSULE EACH NIGHT AT BEDTIME * What changed:    how much to take  how to take this  when to take this  additional instructions   estrogens (conjugated) 0.3 MG tablet Commonly known as:  PREMARIN Take 1 tablet (0.3 mg total) daily by mouth. Take daily for 21 days then do not take for 7 days.   fluticasone 50 MCG/ACT nasal spray Commonly known as:  FLONASE USE 2 SPRAYS INTO EACH NOSTRIL ONCE DAILY AS DIRECTED.   hydrOXYzine 25 MG tablet Commonly known as:  ATARAX/VISTARIL Take 1-2 tablets (25-50 mg total) by mouth every 6 (six) hours as needed for itching.   ibuprofen 200 MG tablet Commonly known as:  ADVIL,MOTRIN You can safely take 3 tablets every 6 hours as needed for pain.  He can alternate this with plain Tylenol.  You can also alternate with the prescribed narcotic.  I would recommend to use the Tylenol and ibuprofen as your first line pain medications.   ipratropium-albuterol 0.5-2.5 (3) MG/3ML Soln Commonly known as:  DUONEB Take 3 mLs by nebulization every 4 (four) hours as needed. What changed:  reasons to take this   losartan 50 MG tablet Commonly known as:  COZAAR TAKE 1 TABLET BY MOUTH DAILY   mometasone-formoterol 200-5 MCG/ACT Aero Commonly known as:  DULERA Inhale 2 puffs into the lungs 2 (two) times daily. What changed:  when to take this   oxyCODONE 5 MG immediate release tablet Commonly known as:  Oxy IR/ROXICODONE Take 1-2 tablets (5-10 mg total) by mouth every 6 (six) hours as needed for moderate pain (not relived by ibuprofen).   promethazine 12.5 MG tablet Commonly known as:  PHENERGAN Take 1 tablet by mouth every 6 (six) hours as needed for nausea or vomiting.   tetrahydrozoline-zinc 0.05-0.25 %  ophthalmic solution Commonly known as:  VISINE-AC Place 2 drops into both eyes 3 (three) times daily as needed (FOR ALLERGIES).   triamterene-hydrochlorothiazide 37.5-25 MG tablet Commonly known as:  MAXZIDE-25 TAKE 1 TABLET BY MOUTH DAILY   zolpidem 10 MG tablet Commonly known as:  AMBIEN TAKE ONE TABLET BY MOUTH EVERY NIGHT AT BEDTIME      Follow-up Information    [facility] [place] Follow up on 08/20/2017.   Specialty:  General Surgery Why:  Your  appointment is at 3:30 PM.  Be at the office 30 minutes early for check in.  Bring photo ID and insurance information with you.   Contact information: [address] [place] [address] [phone]           Signed: Earnstine Regal 08/06/2017, 11:04 AM

## 2017-08-06 NOTE — Progress Notes (Signed)
08/06/17  0805  Offered patient Toradol and Tramadol for pain. Patient refused both stating they are not strong enough and is requesting Morphine IV for pain 10/10.

## 2017-08-07 MED ORDER — ZOLPIDEM TARTRATE 10 MG PO TABS: 10.0000 mg | ORAL_TABLET | Freq: Every day | ORAL | 0 refills | Status: DC

## 2017-08-07 NOTE — Addendum Note (Signed)
Addended by: Kateri Mc E on: 08/07/2017 11:17 AM   Modules accepted: Orders

## 2017-08-07 NOTE — Telephone Encounter (Signed)
Rx last filled 07/09/17, okay to refill?

## 2017-08-29 ENCOUNTER — Other Ambulatory Visit: Payer: Self-pay | Admitting: Family Medicine

## 2017-09-07 ENCOUNTER — Other Ambulatory Visit: Payer: Self-pay | Admitting: Family Medicine

## 2017-09-07 NOTE — Telephone Encounter (Signed)
Copied from [place] [id number]. Topic: Quick Communication - See Telephone Encounter >> Sep 07, 2017 12:11 PM Corie Chiquito, [place] wrote: CRM for notification. See Telephone encounter for: Patient calling because she needs a refill on her Zolpidem. Patient stated that her pharmacy has faxed over the request twice and hasn't heard anything back from office. If someone could give her a call back about this at [phone]  09/07/17.

## 2017-09-09 ENCOUNTER — Ambulatory Visit: Payer: BC Managed Care – PPO | Admitting: Nurse Practitioner

## 2017-09-09 ENCOUNTER — Ambulatory Visit (INDEPENDENT_AMBULATORY_CARE_PROVIDER_SITE_OTHER): Payer: BC Managed Care – PPO

## 2017-09-09 ENCOUNTER — Encounter: Payer: Self-pay | Admitting: Nurse Practitioner

## 2017-09-09 VITALS — BP 128/96 | HR 107 | Temp 98.5°F | Ht 64.0 in | Wt 206.0 lb

## 2017-09-09 DIAGNOSIS — R109 Unspecified abdominal pain: Secondary | ICD-10-CM

## 2017-09-09 DIAGNOSIS — N343 Urethral syndrome, unspecified: Secondary | ICD-10-CM

## 2017-09-09 DIAGNOSIS — H1013 Acute atopic conjunctivitis, bilateral: Secondary | ICD-10-CM

## 2017-09-09 DIAGNOSIS — J0100 Acute maxillary sinusitis, unspecified: Secondary | ICD-10-CM | POA: Diagnosis not present

## 2017-09-09 DIAGNOSIS — Z87442 Personal history of urinary calculi: Secondary | ICD-10-CM | POA: Diagnosis not present

## 2017-09-09 LAB — POCT URINALYSIS DIPSTICK
Bilirubin, UA: NEGATIVE
GLUCOSE UA: NEGATIVE
Ketones, UA: NEGATIVE
NITRITE UA: NEGATIVE
Protein, UA: 15
RBC UA: NEGATIVE
Spec Grav, UA: 1.015 (ref 1.010–1.025)
Urobilinogen, UA: 0.2 E.U./dL
pH, UA: 7.5 (ref 5.0–8.0)

## 2017-09-09 MED ORDER — METHYLPREDNISOLONE 4 MG PO TBPK: ORAL_TABLET | ORAL | 0 refills | Status: DC

## 2017-09-09 MED ORDER — AMOXICILLIN-POT CLAVULANATE 875-125 MG PO TABS: 1.0000 | ORAL_TABLET | Freq: Two times a day (BID) | ORAL | 0 refills | Status: DC

## 2017-09-09 MED ORDER — POLYETHYL GLYCOL-PROPYL GLYCOL 0.4-0.3 % OP SOLN: 1.0000 [drp] | OPHTHALMIC | 0 refills | Status: DC | PRN

## 2017-09-09 NOTE — Progress Notes (Signed)
Subjective:  Patient ID: Melissa Allen, female    DOB: 26-Aug-1966  Age: 52 y.o. MRN: [id number]  CC: Eye Pain (burning sensation,painful,tears,headache,(1)); Sore Throat (sore throat,burning sensation in nose.?); Rash (red spot on abd,neck and arms--itching,feels like needle?); and Back Pain (back pain,frequent urinat,blood in urine,burning at times,/ had apendex taken out in 07/2017 (2))   HPI  Burning and itching of eyes. Blurry vision(chronic per patient).  Rash: Red rash (small red bumps) and itching on upper body last night Resolved with used of  benadryl.  Urinary symptoms: Dysuria, back pain, urgency. Blood in urine 2weeks.  Outpatient Medications Prior to Visit  Medication Sig Dispense Refill  . acetaminophen (TYLENOL) 500 MG tablet You can take up to 3 - 325 mg tablets every 8 hours as needed for pain. OR 2 - 500 mg tablets every 8 hours as needed for pain. You can buy both of these over-the-counter at any drugstore.  DO NOT TAKE MORE THAN 4000 MG OF TYLENOL PER DAY.  IT CAN HARM YOUR LIVER. 30 tablet 0  . albuterol (PROAIR HFA) 108 (90 Base) MCG/ACT inhaler INHALE TWO PUFFS EVERY FOUR HOURS AS NEEDED SHORTNESS OF BREATH 8.5 g 2  . ALPRAZolam (XANAX) 0.25 MG tablet Take 0.25-0.5 mg by mouth 2 (two) times daily as needed for anxiety.    . cyclobenzaprine (FLEXERIL) 10 MG tablet Take 10 mg by mouth 2 (two) times daily as needed for muscle spasms (muscle spasms). Take 1 tablet (10 mg) daily at bedtime, may take an additional tablet during the day as needed for muscle spasms    . DULoxetine (CYMBALTA) 60 MG capsule TAKE ONE CAPSULE EACH NIGHT AT BEDTIME * (Patient taking differently: Take 60 mg by mouth at bedtime. ) 90 capsule 3  . estrogens, conjugated, (PREMARIN) 0.3 MG tablet Take 1 tablet (0.3 mg total) daily by mouth. Take daily for 21 days then do not take for 7 days. 90 tablet 3  . fluticasone (FLONASE) 50 MCG/ACT nasal spray USE 2 SPRAYS INTO EACH NOSTRIL ONCE DAILY AS  DIRECTED. 16 g 2  . hydrOXYzine (ATARAX/VISTARIL) 25 MG tablet Take 1-2 tablets (25-50 mg total) by mouth every 6 (six) hours as needed for itching. 30 tablet 0  . ibuprofen (ADVIL,MOTRIN) 200 MG tablet You can safely take 3 tablets every 6 hours as needed for pain.  He can alternate this with plain Tylenol.  You can also alternate with the prescribed narcotic.  I would recommend to use the Tylenol and ibuprofen as your first line pain medications.    Marland Kitchen ipratropium-albuterol (DUONEB) 0.5-2.5 (3) MG/3ML SOLN Take 3 mLs by nebulization every 4 (four) hours as needed. (Patient taking differently: Take 3 mLs by nebulization every 4 (four) hours as needed (shortness of breath/wheezing). ) 360 mL 1  . losartan (COZAAR) 50 MG tablet TAKE 1 TABLET BY MOUTH DAILY 90 tablet 1  . mometasone-formoterol (DULERA) 200-5 MCG/ACT AERO Inhale 2 puffs into the lungs 2 (two) times daily. (Patient taking differently: Inhale 2 puffs into the lungs daily. ) 1 Inhaler 3  . tetrahydrozoline-zinc (VISINE-AC) 0.05-0.25 % ophthalmic solution Place 2 drops into both eyes 3 (three) times daily as needed (FOR ALLERGIES).    Marland Kitchen triamterene-hydrochlorothiazide (MAXZIDE-25) 37.5-25 MG tablet TAKE 1 TABLET BY MOUTH DAILY 90 tablet 1  . zolpidem (AMBIEN) 10 MG tablet TAKE ONE TABLET BY MOUTH EVERY NIGHT AT BEDTIME 30 tablet 2  . oxyCODONE (OXY IR/ROXICODONE) 5 MG immediate release tablet Take 1-2 tablets (5-10 mg total)  by mouth every 6 (six) hours as needed for moderate pain (not relived by ibuprofen). (Patient not taking: Reported on 09/09/2017) 15 tablet 0  . promethazine (PHENERGAN) 12.5 MG tablet Take 1 tablet by mouth every 6 (six) hours as needed for nausea or vomiting.      No facility-administered medications prior to visit.     ROS See HPI  Objective:  BP (!) 128/96   Pulse (!) 107   Temp 98.5 F (36.9 C) (Oral)   Ht 5\' 4"  (1.626 m)   Wt 206 lb (93.4 kg)   LMP 12/21/2011   SpO2 97%   BMI 35.36 kg/m   BP Readings  from Last 3 Encounters:  09/09/17 (!) 128/96  08/06/17 (!) 109/54  07/13/17 130/78    Wt Readings from Last 3 Encounters:  09/09/17 206 lb (93.4 kg)  08/05/17 198 lb (89.8 kg)  07/13/17 201 lb 12.8 oz (91.5 kg)    Physical Exam  Constitutional: She is oriented to person, place, and time. No distress.  HENT:  Right Ear: Tympanic membrane, external ear and ear canal normal.  Left Ear: Tympanic membrane, external ear and ear canal normal.  Nose: Mucosal edema and rhinorrhea present. Right sinus exhibits maxillary sinus tenderness. Right sinus exhibits no frontal sinus tenderness. Left sinus exhibits maxillary sinus tenderness. Left sinus exhibits no frontal sinus tenderness.  Mouth/Throat: Posterior oropharyngeal erythema present. No oropharyngeal exudate.  Eyes: Conjunctivae, EOM and lids are normal.  Cardiovascular: Normal rate and normal heart sounds.  Pulmonary/Chest: Effort normal and breath sounds normal.  Abdominal: Soft. She exhibits no distension. There is tenderness.  Positive left flank pain  Musculoskeletal: Normal range of motion.  Lymphadenopathy:    She has no cervical adenopathy.  Neurological: She is alert and oriented to person, place, and time.  Skin: Skin is warm and dry. No rash noted. No erythema.  Vitals reviewed.   Lab Results  Component Value Date   WBC 11.8 (H) 08/06/2017   HGB 12.5 08/06/2017   HCT 38.7 08/06/2017   PLT 214 08/06/2017   GLUCOSE 122 (H) 08/05/2017   CHOL 244 (H) 08/04/2011   TRIG 159 (H) 08/04/2011   HDL 71 08/04/2011   LDLCALC 141 (H) 08/04/2011   ALT 17 08/05/2017   AST 23 08/05/2017   NA 137 08/05/2017   K 3.4 (L) 08/05/2017   CL 102 08/05/2017   CREATININE 0.77 08/05/2017   BUN 18 08/05/2017   CO2 26 08/05/2017   TSH 1.21 12/31/2015   INR 0.92 09/19/2013    Ct Abdomen Pelvis W Contrast  Result Date: 08/05/2017 CLINICAL DATA:  Right lower quadrant abdominal pain.  Nausea. EXAM: CT ABDOMEN AND PELVIS WITH CONTRAST  TECHNIQUE: Multidetector CT imaging of the abdomen and pelvis was performed using the standard protocol following bolus administration of intravenous contrast. CONTRAST:  100 cc Isovue-300 IV COMPARISON:  10/20/2016 FINDINGS: Lower chest: No acute abnormality. Collapsed bilateral breast implants partially imaged. Hepatobiliary: Large cysts again noted in the right hepatic lobe measuring up to 7 cm, unchanged. Other smaller scattered cysts. Prior cholecystectomy. No biliary ductal dilatation. Pancreas: No focal abnormality or ductal dilatation. Spleen: No focal abnormality.  Normal size. Adrenals/Urinary Tract: No adrenal abnormality. No focal renal abnormality. No stones or hydronephrosis. Urinary bladder is unremarkable. Stomach/Bowel: Stomach, large and small bowel grossly unremarkable. Appendix: Location: Extends inferior and medial to the cecum in the right lateral pelvis. Diameter: 11 mm Appendicolith: Small appendicoliths at the base of the appendix and in the mid  to distal appendix Mucosal hyper-enhancement: Present Extraluminal gas: Absent Periappendiceal collection: Stranding, no focal fluid collection. Vascular/Lymphatic: No evidence of aneurysm or adenopathy. Reproductive: Prior hysterectomy. Small cyst in the left ovary measures 2.6 cm, stable. Right ovary unremarkable. Other: No free fluid or free air. Musculoskeletal: No acute bony abnormality. IMPRESSION: Changes of acute appendicitis with dilated, inflamed appendix and small appendicoliths as above. Stable hepatic cysts. Electronically Signed   By: Rolm Baptise M.D.   On: 08/05/2017 09:55    Assessment & Plan:   Gwenette was seen today for eye pain, sore throat, rash and back pain.  Diagnoses and all orders for this visit:  Acute non-recurrent maxillary sinusitis -     POCT urinalysis dipstick -     methylPREDNISolone (MEDROL DOSEPAK) 4 MG TBPK tablet; Take as directed on package -     amoxicillin-clavulanate (AUGMENTIN) 875-125 MG tablet;  Take 1 tablet by mouth 2 (two) times daily.  Dysuria-frequency syndrome -     Urine Culture -     DG Abd 1 View  Acute atopic conjunctivitis of both eyes -     Polyethyl Glycol-Propyl Glycol 0.4-0.3 % SOLN; Apply 1 drop to eye as needed.  Acute left flank pain -     DG Abd 1 View  NEPHROLITHIASIS, HX OF   I am having Melissa Allen start on methylPREDNISolone, Polyethyl Glycol-Propyl Glycol, and amoxicillin-clavulanate. I am also having her maintain her ipratropium-albuterol, cyclobenzaprine, albuterol, mometasone-formoterol, DULoxetine, tetrahydrozoline-zinc, hydrOXYzine, fluticasone, ALPRAZolam, promethazine, estrogens (conjugated), losartan, triamterene-hydrochlorothiazide, acetaminophen, ibuprofen, and oxyCODONE.  Meds ordered this encounter  Medications  . methylPREDNISolone (MEDROL DOSEPAK) 4 MG TBPK tablet    Sig: Take as directed on package    Dispense:  21 tablet    Refill:  0    Order Specific Question:   Supervising Provider    Answer:   Lucille Passy [[id number]]  . Polyethyl Glycol-Propyl Glycol 0.4-0.3 % SOLN    Sig: Apply 1 drop to eye as needed.    Refill:  0    Order Specific Question:   Supervising Provider    Answer:   Lucille Passy [[id number]]  . amoxicillin-clavulanate (AUGMENTIN) 875-125 MG tablet    Sig: Take 1 tablet by mouth 2 (two) times daily.    Dispense:  14 tablet    Refill:  0    Order Specific Question:   Supervising Provider    Answer:   Lucille Passy [[id number]]    Follow-up: Return if symptoms worsen or fail to improve.  Wilfred Lacy, NP

## 2017-09-09 NOTE — Patient Instructions (Addendum)
ABD x-ray is negative for renal calculi.  Augmentin and prednisone sent to treat maxillary sinusitis. Use humidifier at bedtime. Use saline sinus rinse as needed to moisturize nasal mucosa.  Urine with minimal bacteria growth. No oral abx needed at this time.

## 2017-09-10 ENCOUNTER — Other Ambulatory Visit: Payer: Self-pay | Admitting: Family Medicine

## 2017-09-10 ENCOUNTER — Telehealth: Payer: Self-pay | Admitting: Family Medicine

## 2017-09-10 LAB — URINE CULTURE
MICRO NUMBER:: 90003265
SPECIMEN QUALITY:: ADEQUATE

## 2017-09-10 NOTE — Telephone Encounter (Signed)
Copied from [place] [id number]. Topic: Quick Communication - See Telephone Encounter >> Sep 07, 2017 12:11 PM Corie Chiquito, [place] wrote: CRM for notification. See Telephone encounter for: Patient calling because she needs a refill on her Zolpidem. Patient stated that her pharmacy has faxed over the request twice and hasn't heard anything back from office. If someone could give her a call back about this at [phone]  09/07/17. >> Sep 10, 2017 11:33 AM Boyd Kerbs wrote: Calling about prescription again.  Michela Pitcher they will know what it is about.  Did not want to give any other information   Patient notified that her Rx has been sent to the Pharmacy per medication list.

## 2017-09-14 ENCOUNTER — Encounter: Payer: Self-pay | Admitting: Nurse Practitioner

## 2017-09-15 ENCOUNTER — Other Ambulatory Visit: Payer: Self-pay | Admitting: Family Medicine

## 2017-10-30 ENCOUNTER — Other Ambulatory Visit: Payer: Self-pay | Admitting: Family Medicine

## 2017-11-02 ENCOUNTER — Other Ambulatory Visit (HOSPITAL_COMMUNITY): Payer: Self-pay | Admitting: Orthopaedic Surgery

## 2017-11-02 ENCOUNTER — Ambulatory Visit (HOSPITAL_COMMUNITY)
Admission: RE | Admit: 2017-11-02 | Discharge: 2017-11-02 | Disposition: A | Discharge status: Home / Self Care | Payer: BC Managed Care – PPO | Source: Ambulatory Visit | Attending: Orthopaedic Surgery | Admitting: Orthopaedic Surgery

## 2017-11-02 DIAGNOSIS — R9389 Abnormal findings on diagnostic imaging of other specified body structures: Secondary | ICD-10-CM | POA: Insufficient documentation

## 2017-11-02 DIAGNOSIS — M7989 Other specified soft tissue disorders: Principal | ICD-10-CM

## 2017-11-02 DIAGNOSIS — M79605 Pain in left leg: Secondary | ICD-10-CM

## 2017-11-02 DIAGNOSIS — M25562 Pain in left knee: Secondary | ICD-10-CM | POA: Insufficient documentation

## 2017-11-02 NOTE — Progress Notes (Signed)
Left lower extremity venous duplex has been completed. Negative for DVT. A cystic structure is found in the left popliteal fossa and medial aspect of the knee. Results were given to [facility] at Dr. Erin Sons office.   11/02/17 3:42 PM Melissa Allen RVT

## 2017-12-01 ENCOUNTER — Other Ambulatory Visit: Payer: Self-pay | Admitting: Family Medicine

## 2018-01-25 ENCOUNTER — Other Ambulatory Visit: Payer: Self-pay | Admitting: Family Medicine

## 2018-02-03 ENCOUNTER — Other Ambulatory Visit: Payer: Self-pay | Admitting: Family Medicine

## 2018-02-03 NOTE — Telephone Encounter (Signed)
Pt needs appt with PCP within 60 days for mor refills.

## 2018-02-18 ENCOUNTER — Ambulatory Visit: Payer: Self-pay | Admitting: *Deleted

## 2018-02-18 NOTE — Telephone Encounter (Signed)
Patient wants to make sure her provider knows she has been having elevated BP- 160/110- 4/24. Patient goes to the [facility]- she checks it- most days the top is around 150 and the bottom number is 90-95. Patient is needing a CPE- but she needs to be seen for medication/BP. Appointment made per patient's schedule. Advised patient if she should have an increase in BP- to call back or go to ED/UC.  Reason for Disposition . Systolic BP  >= 979 OR Diastolic >= 480  Answer Assessment - Initial Assessment Questions 1. BLOOD PRESSURE: "What is the blood pressure?" "Did you take at least two measurements 5 minutes apart?"     Patient is not anywhere she can check it now 2. ONSET: "When did you take your blood pressure?"     She takes it at home and at the [facility] 3. HOW: "How did you obtain the blood pressure?" (e.g., visiting nurse, automatic home BP monitor)     Automatic BP 4. HISTORY: "Do you have a history of high blood pressure?"     yes 5. MEDICATIONS: "Are you taking any medications for blood pressure?" "Have you missed any doses recently?"     Yes, No missed BP 6. OTHER SYMPTOMS: "Do you have any symptoms?" (e.g., headache, chest pain, blurred vision, difficulty breathing, weakness)     Patient feels she is retaining more fluid- swelling more , a lot of pain 7. PREGNANCY: "Is there any chance you are pregnant?" "When was your last menstrual period?"     n/a  Protocols used: HIGH BLOOD PRESSURE-A-AH

## 2018-02-19 NOTE — Telephone Encounter (Signed)
TA-FYI Pt scheduled for Tues/thx dmf

## 2018-02-22 ENCOUNTER — Other Ambulatory Visit: Payer: Self-pay | Admitting: Family Medicine

## 2018-02-22 NOTE — Telephone Encounter (Signed)
Patient has appt on 6.18.19 and we will refill it then/thx dmf

## 2018-02-23 ENCOUNTER — Ambulatory Visit: Payer: BC Managed Care – PPO | Admitting: Family Medicine

## 2018-02-23 ENCOUNTER — Encounter: Payer: Self-pay | Admitting: Family Medicine

## 2018-02-23 VITALS — BP 144/90 | HR 110 | Temp 98.4°F | Ht 64.0 in | Wt 200.4 lb

## 2018-02-23 DIAGNOSIS — I10 Essential (primary) hypertension: Secondary | ICD-10-CM

## 2018-02-23 DIAGNOSIS — Z1239 Encounter for other screening for malignant neoplasm of breast: Secondary | ICD-10-CM

## 2018-02-23 DIAGNOSIS — Z1231 Encounter for screening mammogram for malignant neoplasm of breast: Secondary | ICD-10-CM | POA: Diagnosis not present

## 2018-02-23 DIAGNOSIS — M797 Fibromyalgia: Secondary | ICD-10-CM

## 2018-02-23 MED ORDER — DULOXETINE HCL 60 MG PO CPEP: ORAL_CAPSULE | ORAL | 0 refills | Status: DC

## 2018-02-23 MED ORDER — PREGABALIN 75 MG PO CAPS: 75.0000 mg | ORAL_CAPSULE | Freq: Two times a day (BID) | ORAL | 2 refills | Status: DC

## 2018-02-23 NOTE — Addendum Note (Signed)
Addended by: Lucille Passy on: 02/23/2018 01:12 PM   Modules accepted: Orders

## 2018-02-23 NOTE — Assessment & Plan Note (Addendum)
Deteriorated. Addt Lyrica right away 75 mg twice daily to Cymbalta. Follow up after she sees rheumatology. The patient indicates understanding of these issues and agrees with the plan.

## 2018-02-23 NOTE — Progress Notes (Signed)
Subjective:   Patient ID: Melissa Allen, female    DOB: 1966/03/04, 52 y.o.   MRN: [id number]  Melissa Allen is a pleasant 52 y.o. year old female who presents to clinic today with Follow-up (Patient is here today for a medication F/U.  She states that her BP has been running high but feels that it may be due to her level of pain.  States that she feels she may be in the middle of a Fibro Flare-up.  All of her joints in her fingers and all over hurts.  Feels like her quality of life has changed.  She has an appt with Rheum 8.15.19.  Some days she can't get out of the bed. She would like to discuss retrial of Lyrica.  She doesn't feel that the Cymbalta helps at all anymore.) and Follow-up (She agrees to a Mammogram at [facility] and will call them if referral is created.  She agrees to [employer].  She declines the Tdap for today since she is not feeling well.)  on 02/23/2018  HPI:  Patient is here today for a medication F/U.   HTN-  She states that her BP has been running high but feels that it may be due to her level of pain.  Currently taking Cozaar and Maxide daily.  Compliant with rxs. Lab Results  Component Value Date   CREATININE 0.77 08/05/2017       States that she feels she may be in the middle of a Fibro Flare-up. All of her joints in her fingers and all over hurts. Feels like her quality of life has changed. She has an appt with Rheum 8.15.19. Some days she can't get out of the bed. She would like to discuss retrial of Lyrica. She doesn't feel that the Cymbalta helps at all anymore.    Current Outpatient Medications on File Prior to Visit  Medication Sig Dispense Refill  . albuterol (PROAIR HFA) 108 (90 Base) MCG/ACT inhaler INHALE TWO PUFFS EVERY FOUR HOURS AS NEEDED SHORTNESS OF BREATH 8.5 g 2  . ALPRAZolam (XANAX) 0.25 MG tablet TAKE 1 TO 2 TABLETS BY MOUTH DAILY 60 tablet 0  . cyclobenzaprine (FLEXERIL) 10 MG tablet Take 10 mg by mouth 2 (two) times daily as needed for muscle  spasms (muscle spasms). Take 1 tablet (10 mg) daily at bedtime, may take an additional tablet during the day as needed for muscle spasms    . DULoxetine (CYMBALTA) 60 MG capsule TAKE 1 CAPSULE BY MOUTH EVERY NIGHT AT BEDTIME 90 capsule 0  . estrogens, conjugated, (PREMARIN) 0.3 MG tablet Take 1 tablet (0.3 mg total) daily by mouth. Take daily for 21 days then do not take for 7 days. 90 tablet 3  . fluticasone (FLONASE) 50 MCG/ACT nasal spray USE 2 SPRAYS INTO EACH NOSTRIL ONCE DAILY AS DIRECTED. 16 g 2  . hydrOXYzine (ATARAX/VISTARIL) 25 MG tablet Take 1-2 tablets (25-50 mg total) by mouth every 6 (six) hours as needed for itching. 30 tablet 0  . ibuprofen (ADVIL,MOTRIN) 200 MG tablet You can safely take 3 tablets every 6 hours as needed for pain.  He can alternate this with plain Tylenol.  You can also alternate with the prescribed narcotic.  I would recommend to use the Tylenol and ibuprofen as your first line pain medications.    Marland Kitchen ipratropium-albuterol (DUONEB) 0.5-2.5 (3) MG/3ML SOLN Take 3 mLs by nebulization every 4 (four) hours as needed. (Patient taking differently: Take 3 mLs by nebulization every 4 (four)  hours as needed (shortness of breath/wheezing). ) 360 mL 1  . losartan (COZAAR) 50 MG tablet TAKE 1 TABLET BY MOUTH ONCE DAILY 90 tablet 0  . mometasone-formoterol (DULERA) 200-5 MCG/ACT AERO Inhale 2 puffs into the lungs 2 (two) times daily. (Patient taking differently: Inhale 2 puffs into the lungs daily. ) 1 Inhaler 3  . triamterene-hydrochlorothiazide (MAXZIDE-25) 37.5-25 MG tablet TAKE 1 TABLET BY MOUTH ONCE DAILY 90 tablet 0  . zolpidem (AMBIEN) 10 MG tablet TAKE 1 TABLET BY MOUTH DAILY AT BEDTIME 30 tablet 0   No current facility-administered medications on file prior to visit.     Allergies  Allergen Reactions  . Latex Itching, Swelling and Dermatitis    SWELLING FROM FACE MASK RING AFTER LAST SURGERY   . Lisinopril Cough       . Codeine Nausea And Vomiting  . Dilaudid  [Hydromorphone Hcl] Itching       . Hydrocodone Itching  . Tape Rash    TOLERATES PAPER TAPE ONLY NO PINK SURGICAL TAPE EITHER    Past Medical History:  Diagnosis Date  . Allergy   . Anginal pain ([place])    one episode 4-5 years ago-[facility]-nonspecific-no problems since-  . Anxiety   . Arthritis    multiple areas of arthritis- DDD  . Asthma    recent admit to ER 01/11/12 for exac of asthma  . Calcaneal fracture   . Duplicated ureter, right   . Fibromyalgia   . Hypertension   . Kidney stones   . Obesity (BMI 30.0-34.9)   . Pancreatitis 1986  . PONV (postoperative nausea and vomiting)   . Poor venous access    hard to start iv-  . 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 Shortness of breath dyspnea   . Wears glasses     Past Surgical History:  Procedure Laterality Date  . ABDOMINAL HYSTERECTOMY    . APPENDECTOMY  08/05/2017  . BLADDER SUSPENSION  03/01/2012   Procedure: TRANSVAGINAL TAPE (TVT) PROCEDURE;  Surgeon: Emily Filbert, MD;  Location: [facility];  Service: Gynecology;  Laterality: N/A;  . BUNIONECTOMY  1995   Right  . CHOLECYSTECTOMY  late 1980's      . CYSTOSCOPY  03/01/2012   Procedure: CYSTOSCOPY;  Surgeon: Emily Filbert, MD;  Location: [facility];  Service: Gynecology;  Laterality: N/A;  . CYSTOSCOPY W/ URETERAL STENT PLACEMENT Left 12/15/2014   Procedure: CYSTOSCOPY, LEFT URETEROSCOPYU WITH STONE EXTRACTION;  Surgeon: Irine Seal, MD;  Location: [facility];  Service: Urology;  Laterality: Left;  . CYSTOSCOPY W/ URETERAL STENT PLACEMENT Left 10/02/2016   Procedure: CYSTOSCOPY WITH RETROGRADE PYELOGRAM/URETERAL STENT PLACEMENT ureteroscopy, basket extraction;  Surgeon: Cleon Gustin, MD;  Location: [facility];  Service: Urology;  Laterality: Left;  . HARDWARE REMOVAL N/A 12/22/2016   Procedure: Lumbar Four-Five Removal of Hardware;  Surgeon: Earnie Larsson, MD;  Location: [facility];  Service: Neurosurgery;   Laterality: N/A;  . INCISION AND DRAINAGE ABSCESS Left 07/19/2013   Procedure: INCISION AND DRAINAGE LEFT LOWER EXTERMITY HEMATOMA;  Surgeon: Imogene Burn. Georgette Dover, MD;  Location: [facility];  Service: General;  Laterality: Left;  Excision left lower leg mass  . KNEE CARTILAGE SURGERY     left  . KNEE SURGERY Left    x 2, Murphy/Sue, corrected Patella displacement   . LAMINECTOMY    . LAPAROSCOPIC APPENDECTOMY N/A 08/05/2017   Procedure:  APPENDECTOMY LAPAROSCOPIC;  Surgeon: Kinsinger, Arta Bruce, MD;  Location: [facility];  Service: General;  Laterality: N/A;  . LUMBAR LAMINECTOMY  10/06   L4-5, Ray  . R compressed pronator Right    Sypher, R arm  . RESECTION DISTAL CLAVICAL Right 06/30/2017   Procedure: RIGHT RESECTION DISTAL CLAVICAL;  Surgeon: Melrose Nakayama, MD;  Location: [facility];  Service: Orthopedics;  Laterality: Right;  . SHOULDER ACROMIOPLASTY Right 06/30/2017   Procedure: RIGHT SHOULDER ACROMIOPLASTY;  Surgeon: Melrose Nakayama, MD;  Location: [facility];  Service: Orthopedics;  Laterality: Right;  . SHOULDER ARTHROSCOPY Right 06/30/2017   Procedure: ARTHROSCOPY DEBRIDEMENT RIGHT SHOULDER;  Surgeon: Melrose Nakayama, MD;  Location: [facility];  Service: Orthopedics;  Laterality: Right;  . SPINAL FUSION    . TENDON REPAIR  2013  . TENDON REPAIR Right 02/01/12  . TONSILLECTOMY  age 72    Family History  Problem Relation Age of Onset  . Cancer Mother        pancreatic cancer  . Heart disease Father 38  . Colon polyps Father   . Colon cancer Paternal Grandfather   . Colon cancer Maternal Grandmother   . Uterine cancer Unknown        Grandmother  . Breast cancer Unknown        Grandmother  . Ovarian cancer Unknown        Grandmother  . Diabetes Unknown        mother  . Other Neg Hx     Social History   Socioeconomic History  . Marital status: Married    Spouse name: Not on file  . Number of children: 3  . Years of education: Not on file  . Highest education level:  Not on file  Occupational History  . Occupation: Product manager: [employer]    Comment: [employer] (6th grade)  Social Needs  . Financial resource strain: Not on file  . Food insecurity:    Worry: Not on file    Inability: Not on file  . Transportation needs:    Medical: Not on file    Non-medical: Not on file  Tobacco Use  . Smoking status: Never Smoker  . Smokeless tobacco: Never Used  Substance and Sexual Activity  . Alcohol use: Yes    Alcohol/week: 4.2 oz    Types: 7 Standard drinks or equivalent per week    Comment: Occasional- wine or liquor twice a week  . Drug use: No  . Sexual activity: Yes    Birth control/protection: Surgical  Lifestyle  . Physical activity:    Days per week: Not on file    Minutes per session: Not on file  . Stress: Not on file  Relationships  . Social connections:    Talks on phone: Not on file    Gets together: Not on file    Attends religious service: Not on file    Active member of club or organization: Not on file    Attends meetings of clubs or organizations: Not on file    Relationship status: Not on file  . Intimate partner violence:    Fear of current or ex partner: Not on file    Emotionally abused: Not on file    Physically abused: Not on file    Forced sexual activity: Not on file  Other Topics Concern  . 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  Review of Systems  Constitutional: Positive for fatigue.  HENT: Negative.   Eyes: Negative.   Respiratory: Negative.   Cardiovascular: Negative.   Musculoskeletal: Positive for arthralgias, back pain, joint swelling, myalgias, neck pain and neck stiffness.    Skin: Negative.   Neurological: Positive for numbness. Negative for dizziness, tremors, seizures, syncope, facial asymmetry, speech difficulty, weakness, light-headedness and headaches.  Psychiatric/Behavioral: Negative.   All other systems reviewed and are negative.      Objective:    BP (!) 148/104 (BP Location: Right Arm, Patient Position: Standing, Cuff Size: Large)   Pulse (!) 110   Temp 98.4 F (36.9 C) (Oral)   Ht 5\' 4"  (1.626 m)   Wt 200 lb 6.4 oz (90.9 kg)   LMP 12/21/2011   SpO2 98%   BMI 34.40 kg/m    Physical Exam  Constitutional: She is oriented to person, place, and time. She appears well-developed and well-nourished. No distress.  HENT:  Head: Normocephalic and atraumatic.  Eyes: EOM are normal.  Cardiovascular: Normal rate.  Pulmonary/Chest: Effort normal.  Musculoskeletal: Normal range of motion.  Neurological: She is alert and oriented to person, place, and time. No cranial nerve deficit.  Skin: Skin is warm and dry. She is not diaphoretic.  Psychiatric: She has a normal mood and affect. Her behavior is normal. Judgment and thought content normal.  Nursing note and vitals reviewed.         Assessment & Plan:   Essential hypertension  Fibromyalgia  Screening for breast cancer - Plan: MM Digital Screening No follow-ups on file.

## 2018-02-23 NOTE — Patient Instructions (Addendum)
Great to see you. We are adding  Lyrica 75 mg twice daily. Continue Cymbalta for now. Come see me after you see rheumatology.

## 2018-02-23 NOTE — Assessment & Plan Note (Signed)
BP Readings from Last 3 Encounters:  02/23/18 (!) 144/90  09/09/17 (!) 128/96  08/06/17 (!) 109/54   Likely elevated due to pain. She will watch it for me and update me.

## 2018-02-25 ENCOUNTER — Telehealth: Payer: Self-pay

## 2018-02-25 ENCOUNTER — Other Ambulatory Visit: Payer: Self-pay | Admitting: Family Medicine

## 2018-02-25 NOTE — Telephone Encounter (Signed)
Copied from [place] [phone]. Topic: Quick Communication - Rx Refill/Question >> Feb 25, 2018 12:55 PM Carolyn Stare wrote: Medication   zolpidem (AMBIEN) 10 MG tablet ,  XANAX     pt had her appt on 02/23/18     Has the patient contacted their pharmacy yes     Preferred Pharmacy [facility]  [place]   Agent: Please be advised that RX refills may take up to 3 business days. We ask that you follow-up with your pharmacy.

## 2018-02-26 ENCOUNTER — Other Ambulatory Visit: Payer: Self-pay | Admitting: Family Medicine

## 2018-02-26 MED ORDER — ZOLPIDEM TARTRATE 10 MG PO TABS: 10.0000 mg | ORAL_TABLET | Freq: Every day | ORAL | 0 refills | Status: DC

## 2018-02-26 MED ORDER — ALPRAZOLAM 0.25 MG PO TABS: 0.2500 mg | ORAL_TABLET | Freq: Every day | ORAL | 0 refills | Status: DC

## 2018-02-26 NOTE — Telephone Encounter (Signed)
Please advise 

## 2018-02-26 NOTE — Telephone Encounter (Signed)
Rx renewed

## 2018-02-26 NOTE — Telephone Encounter (Signed)
Dr. Zigmund Daniel please advise. Last ov 02/23/2018 wit Dr. Deborra Medina. (accidentally print it out under Dr. Deborra Medina name).

## 2018-02-26 NOTE — Telephone Encounter (Signed)
Pt is aware.  

## 2018-03-25 ENCOUNTER — Other Ambulatory Visit: Payer: Self-pay | Admitting: Family Medicine

## 2018-04-15 ENCOUNTER — Other Ambulatory Visit: Payer: Self-pay | Admitting: Family Medicine

## 2018-04-21 ENCOUNTER — Other Ambulatory Visit: Payer: Self-pay | Admitting: Family Medicine

## 2018-04-29 ENCOUNTER — Other Ambulatory Visit (HOSPITAL_BASED_OUTPATIENT_CLINIC_OR_DEPARTMENT_OTHER): Payer: Self-pay

## 2018-04-29 DIAGNOSIS — R0683 Snoring: Secondary | ICD-10-CM

## 2018-04-29 DIAGNOSIS — G4700 Insomnia, unspecified: Secondary | ICD-10-CM

## 2018-04-29 DIAGNOSIS — R454 Irritability and anger: Secondary | ICD-10-CM

## 2018-04-29 DIAGNOSIS — R5383 Other fatigue: Secondary | ICD-10-CM

## 2018-05-22 ENCOUNTER — Other Ambulatory Visit: Payer: Self-pay | Admitting: Family Medicine

## 2018-05-30 ENCOUNTER — Encounter

## 2018-05-30 ENCOUNTER — Ambulatory Visit (HOSPITAL_BASED_OUTPATIENT_CLINIC_OR_DEPARTMENT_OTHER): Payer: BC Managed Care – PPO | Attending: Anesthesiology | Admitting: Internal Medicine

## 2018-05-30 VITALS — Ht 63.0 in | Wt 200.0 lb

## 2018-05-30 DIAGNOSIS — G4700 Insomnia, unspecified: Secondary | ICD-10-CM | POA: Diagnosis not present

## 2018-05-30 DIAGNOSIS — R0683 Snoring: Secondary | ICD-10-CM | POA: Diagnosis present

## 2018-05-30 DIAGNOSIS — R5383 Other fatigue: Secondary | ICD-10-CM

## 2018-05-30 DIAGNOSIS — Z79899 Other long term (current) drug therapy: Secondary | ICD-10-CM | POA: Insufficient documentation

## 2018-05-30 DIAGNOSIS — R0902 Hypoxemia: Secondary | ICD-10-CM | POA: Insufficient documentation

## 2018-05-30 DIAGNOSIS — R454 Irritability and anger: Secondary | ICD-10-CM

## 2018-06-13 DIAGNOSIS — R0683 Snoring: Secondary | ICD-10-CM | POA: Diagnosis not present

## 2018-06-13 NOTE — Procedures (Signed)
  Patient Name: Melissa Allen, Melissa Allen Date: 05/30/2018 Gender: Female D.O.B: Sep 25, 1965 Age (years): 52 Referring Provider: Nicholaus Bloom Height (inches): 63 Interpreting Physician: Baird Lyons MD, ABSM Weight (lbs): 200 RPSGT: Baxter Flattery BMI: 35 MRN: [id number] Neck Size: 17.50  CLINICAL INFORMATION Sleep Study Type: NPSG Indication for sleep study: Fatigue, Snoring, Witnesses Apnea / Gasping During Sleep  Epworth Sleepiness Score: 7  SLEEP STUDY TECHNIQUE As per the AASM Manual for the Scoring of Sleep and Associated Events v2.3 (April 2016) with a hypopnea requiring 4% desaturations.  The channels recorded and monitored were frontal, central and occipital EEG, electrooculogram (EOG), submentalis EMG (chin), nasal and oral airflow, thoracic and abdominal wall motion, anterior tibialis EMG, snore microphone, electrocardiogram, and pulse oximetry.  MEDICATIONS Medications self-administered by patient taken the night of the study : AMBIEN, LYRICA, Catarina The study was initiated at 10:44:38 PM and ended at 5:00:27 AM.  Sleep onset time was 61.4 minutes and the sleep efficiency was 25.9%%. The total sleep time was 97.5 minutes.  Stage REM latency was N/A minutes.  The patient spent 47.7%% of the night in stage N1 sleep, 52.3%% in stage N2 sleep, 0.0%% in stage N3 and 0% in REM.  Alpha intrusion was absent.  Supine sleep was 1.03%.  RESPIRATORY PARAMETERS The overall apnea/hypopnea index (AHI) was 0.6 per hour. There were 0 total apneas, including 0 obstructive, 0 central and 0 mixed apneas. There were 1 hypopneas and 1 RERAs.  The AHI during Stage REM sleep was N/A per hour.  AHI while supine was 0.0 per hour.  The mean oxygen saturation was 90.9%. The minimum SpO2 during sleep was 87.0%.  loud snoring was noted during this study.  CARDIAC DATA The 2 lead EKG demonstrated sinus rhythm. The mean heart rate was 87.6 beats per minute. Other EKG  findings include: None.  LEG MOVEMENT DATA The total PLMS were 0 with a resulting PLMS index of 0.0. Associated arousal with leg movement index was 0.0 .  IMPRESSIONS - No significant obstructive sleep apnea occurred during this study (AHI = 0.6/h). - No significant central sleep apnea occurred during this study (CAI = 0.0/h). - Mild oxygen desaturation was noted during this study (Min O2 = 87.0%, Mean 90.9%). Time - The patient snored with loud snoring volume. - No cardiac abnormalities were noted during this study. - Clinically significant periodic limb movements did not occur during sleep. No significant associated arousals. - Patient had significant difficulty initiating and maintaining sleep, despite medications listed above. Asleep only 97.5 minutes.  DIAGNOSIS - Nocturnal Hypoxemia (327.26 [G47.36 ICD-10]) - Insomnia (G47.00)  RECOMMENDATIONS - Consider weight loss, sleep position off back and ENT factors contributing to snoring. - Sleep hygiene should be reviewed to assess factors that may improve sleep quality. - Weight management and regular exercise should be initiated or continued if appropriate.  [Electronically signed] 06/13/2018 11:43 AM  Baird Lyons MD, ABSM Diplomate, American Board of Sleep Medicine   NPI: [id number]                           [place] [place] [facility]  ELECTRONICALLY SIGNED ON:  06/13/2018, 11:38 AM [place] PH: [phone]   FX: [phone] [place]

## 2018-06-16 ENCOUNTER — Other Ambulatory Visit: Payer: Self-pay | Admitting: Neurosurgery

## 2018-06-16 DIAGNOSIS — M545 Low back pain, unspecified: Secondary | ICD-10-CM

## 2018-06-19 ENCOUNTER — Ambulatory Visit
Admission: RE | Admit: 2018-06-19 | Discharge: 2018-06-19 | Disposition: A | Discharge status: Home / Self Care | Payer: BC Managed Care – PPO | Source: Ambulatory Visit | Attending: Neurosurgery | Admitting: Neurosurgery

## 2018-06-19 DIAGNOSIS — M545 Low back pain, unspecified: Secondary | ICD-10-CM

## 2018-06-19 MED ORDER — GADOBENATE DIMEGLUMINE 529 MG/ML IV SOLN
19.0000 mL | Freq: Once | INTRAVENOUS | Status: AC | PRN
  Administered 2018-06-19: 19 mL via INTRAVENOUS

## 2018-06-24 ENCOUNTER — Other Ambulatory Visit: Payer: Self-pay | Admitting: Orthopaedic Surgery

## 2018-06-24 DIAGNOSIS — M25811 Other specified joint disorders, right shoulder: Secondary | ICD-10-CM

## 2018-06-25 ENCOUNTER — Ambulatory Visit
Admission: RE | Admit: 2018-06-25 | Discharge: 2018-06-25 | Disposition: A | Discharge status: Home / Self Care | Payer: BC Managed Care – PPO | Source: Ambulatory Visit | Attending: Orthopaedic Surgery | Admitting: Orthopaedic Surgery

## 2018-06-25 DIAGNOSIS — M25811 Other specified joint disorders, right shoulder: Secondary | ICD-10-CM

## 2018-06-25 MED ORDER — GADOBENATE DIMEGLUMINE 529 MG/ML IV SOLN
20.0000 mL | Freq: Once | INTRAVENOUS | Status: AC | PRN
  Administered 2018-06-25: 20 mL via INTRAVENOUS

## 2018-07-14 ENCOUNTER — Other Ambulatory Visit: Payer: Self-pay | Admitting: Family Medicine

## 2018-07-22 ENCOUNTER — Other Ambulatory Visit: Payer: Self-pay | Admitting: Neurosurgery

## 2018-08-10 ENCOUNTER — Encounter (HOSPITAL_COMMUNITY)
Admission: RE | Admit: 2018-08-10 | Discharge: 2018-08-10 | Disposition: A | Discharge status: Home / Self Care | Payer: BC Managed Care – PPO | Source: Ambulatory Visit | Attending: Neurosurgery | Admitting: Neurosurgery

## 2018-08-10 ENCOUNTER — Other Ambulatory Visit: Payer: Self-pay

## 2018-08-10 ENCOUNTER — Encounter (HOSPITAL_COMMUNITY): Payer: Self-pay

## 2018-08-10 DIAGNOSIS — Z01818 Encounter for other preprocedural examination: Secondary | ICD-10-CM | POA: Diagnosis present

## 2018-08-10 DIAGNOSIS — M4316 Spondylolisthesis, lumbar region: Secondary | ICD-10-CM | POA: Insufficient documentation

## 2018-08-10 LAB — CBC WITH DIFFERENTIAL/PLATELET
Abs Immature Granulocytes: 0.03 10*3/uL (ref 0.00–0.07)
Basophils Absolute: 0.1 10*3/uL (ref 0.0–0.1)
Basophils Relative: 1 %
Eosinophils Absolute: 0.2 10*3/uL (ref 0.0–0.5)
Eosinophils Relative: 3 %
HEMATOCRIT: 45.4 % (ref 36.0–46.0)
Hemoglobin: 14.2 g/dL (ref 12.0–15.0)
IMMATURE GRANULOCYTES: 0 %
LYMPHS ABS: 2.3 10*3/uL (ref 0.7–4.0)
LYMPHS PCT: 31 %
MCH: 28.7 pg (ref 26.0–34.0)
MCHC: 31.3 g/dL (ref 30.0–36.0)
MCV: 91.7 fL (ref 80.0–100.0)
Monocytes Absolute: 0.5 10*3/uL (ref 0.1–1.0)
Monocytes Relative: 7 %
Neutro Abs: 4.1 10*3/uL (ref 1.7–7.7)
Neutrophils Relative %: 58 %
Platelets: 280 10*3/uL (ref 150–400)
RBC: 4.95 MIL/uL (ref 3.87–5.11)
RDW: 12.8 % (ref 11.5–15.5)
WBC: 7.2 10*3/uL (ref 4.0–10.5)
nRBC: 0 % (ref 0.0–0.2)

## 2018-08-10 LAB — TYPE AND SCREEN
ABO/RH(D): AB POS
Antibody Screen: NEGATIVE

## 2018-08-10 LAB — BASIC METABOLIC PANEL
Anion gap: 10 (ref 5–15)
BUN: 14 mg/dL (ref 6–20)
CO2: 27 mmol/L (ref 22–32)
CREATININE: 0.66 mg/dL (ref 0.44–1.00)
Calcium: 9.6 mg/dL (ref 8.9–10.3)
Chloride: 99 mmol/L (ref 98–111)
GFR calc Af Amer: >60 mL/min (ref >60)
GFR calc non Af Amer: >60 mL/min (ref >60)
Glucose, Bld: 103 mg/dL — ABNORMAL HIGH (ref 70–99)
Potassium: 3.3 mmol/L — ABNORMAL LOW (ref 3.5–5.1)
Sodium: 136 mmol/L (ref 135–145)

## 2018-08-10 LAB — SURGICAL PCR SCREEN
MRSA, PCR: NEGATIVE
Staphylococcus aureus: NEGATIVE

## 2018-08-10 NOTE — Pre-Procedure Instructions (Addendum)
Melissa Allen  08/10/2018      [name] [place] - [address], [address], [address] [name] [address] Phone: [phone] Fax: [phone]  [place], [address] [place] [address] Phone: [phone] Fax: [phone]    Your procedure is scheduled on Tues., Dec. 10, 2019 from 8:00AM-10:49AM  Report to [facility] Admitting Entrance "A" at 6:00AM  Call this number if you have problems the morning of surgery:  [phone]   Remember:  Do not eat or drink after midnight on Dec. 9th    Take these medicines the morning of surgery with A SIP OF WATER: ALPRAZolam Duanne Moron), Fluticasone (FLONASE), Mometasone-formoterol (DULERA), Oxymorphone (OPANA ER), and PREMARIN   If needed: Ipratropium-albuterol (DUONEB) and Albuterol  Inhaler-bring with you the day of surgery  As of today, stop taking all Other Aspirin Products, Vitamins, Fish oils, and Herbal medications. Also stop all NSAIDS i.e. Advil, Ibuprofen, Motrin, Aleve, Anaprox, Naproxen, BC, Goody Powders, and all Supplements.   Do not wear jewelry, make-up or nail polish.  Do not wear lotions, powders, or perfumes, or deodorant.  Do not shave 48 hours prior to surgery.    Do not bring valuables to the hospital.  [facility] is not responsible for any belongings or valuables.  Contacts, dentures or bridgework may not be worn into surgery.  Leave your suitcase in the car.  After surgery it may be brought to your room.  For patients admitted to the hospital, discharge time will be determined by your treatment team.  Patients discharged the day of surgery will not be allowed to drive home.   Special instructions:   [facility]- Preparing For Surgery  Before surgery, you can play an important role. Because skin is not sterile, your skin needs to be as free of germs as possible. You can reduce the number of germs on  your skin by washing with CHG (chlorahexidine gluconate) Soap before surgery.  CHG is an antiseptic cleaner which kills germs and bonds with the skin to continue killing germs even after washing.    Oral Hygiene is also important to reduce your risk of infection.  Remember - BRUSH YOUR TEETH THE MORNING OF SURGERY WITH YOUR REGULAR TOOTHPASTE  Please do not use if you have an allergy to CHG or antibacterial soaps. If your skin becomes reddened/irritated stop using the CHG.  Do not shave (including legs and underarms) for at least 48 hours prior to first CHG shower. It is OK to shave your face.  Please follow these instructions carefully.   1. Shower the NIGHT BEFORE SURGERY and the MORNING OF SURGERY with CHG.   2. If you chose to wash your hair, wash your hair first as usual with your normal shampoo.  3. After you shampoo, rinse your hair and body thoroughly to remove the shampoo.  4. Use CHG as you would any other liquid soap. You can apply CHG directly to the skin and wash gently with a scrungie or a clean washcloth.   5. Apply the CHG Soap to your body ONLY FROM THE NECK DOWN.  Do not use on open wounds or open sores. Avoid contact with your eyes, ears, mouth and genitals (private parts). Wash Face and genitals (private parts)  with your normal soap.  6. Wash thoroughly, paying special attention to the area where your surgery will be performed.  7. Thoroughly rinse  your body with warm water from the neck down.  8. DO NOT shower/wash with your normal soap after using and rinsing off the CHG Soap.  9. Pat yourself dry with a CLEAN TOWEL.  10. Wear CLEAN PAJAMAS to bed the night before surgery, wear comfortable clothes the morning of surgery  11. Place CLEAN SHEETS on your bed the night of your first shower and DO NOT SLEEP WITH PETS.  Day of Surgery:  Do not apply any deodorants/lotions.  Please wear clean clothes to the hospital/surgery center.   Remember to brush your teeth  WITH YOUR REGULAR TOOTHPASTE.  Please read over the following fact sheets that you were given. Pain Booklet, Coughing and Deep Breathing, MRSA Information and Surgical Site Infection Prevention

## 2018-08-10 NOTE — Progress Notes (Signed)
PCP - Dr. Arnette Norris  Cardiologist - Denies  Chest x-ray - Denies  EKG - 08/10/18  Stress Test - Denies  ECHO - 09/20/13 (E)  Cardiac Cath - Denies  AICD- na PM- na LOOP- na  Sleep Study - Yes- Negative CPAP - None  LABS- 08/10/18: CBC w/D, BMP, T/S, PCR  ASA- Denies   Anesthesia- No  Pt denies having chest pain, sob, or fever at this time. All instructions explained to the pt, with a verbal understanding of the material. Pt agrees to go over the instructions while at home for a better understanding. The opportunity to ask questions was provided.

## 2018-08-12 ENCOUNTER — Other Ambulatory Visit: Payer: Self-pay | Admitting: *Deleted

## 2018-08-12 MED ORDER — LOSARTAN POTASSIUM 50 MG PO TABS: 50.0000 mg | ORAL_TABLET | Freq: Every day | ORAL | 0 refills | Status: DC

## 2018-08-12 MED ORDER — DULOXETINE HCL 60 MG PO CPEP: 60.0000 mg | ORAL_CAPSULE | Freq: Every day | ORAL | 0 refills | Status: DC

## 2018-08-12 MED ORDER — TRIAMTERENE-HCTZ 37.5-25 MG PO TABS: 1.0000 | ORAL_TABLET | Freq: Every day | ORAL | 0 refills | Status: DC

## 2018-08-12 NOTE — Telephone Encounter (Signed)
OK to refill? Last filled 07/14/18

## 2018-08-13 MED ORDER — ZOLPIDEM TARTRATE 10 MG PO TABS: 10.0000 mg | ORAL_TABLET | Freq: Every day | ORAL | 0 refills | Status: DC

## 2018-08-13 MED ORDER — ALPRAZOLAM 0.25 MG PO TABS: 0.2500 mg | ORAL_TABLET | Freq: Every day | ORAL | 0 refills | Status: DC

## 2018-08-13 NOTE — Telephone Encounter (Addendum)
FYI-Patient is having back surgery 08/17/18-will not be able to make an office visit in the future- Please advise on further refills. Patient is aware of the refills that were provided till Dr. Deborra Medina returns.      08/13/2018 12:36 PM Nche, Charlene Brooke, NP Nicha Hemann A, CMA Rx Response   Comment: Please inform patient that a limited supply of medication was provided because she needs an appt with Dr. Deborra Medina for further refills.

## 2018-08-16 ENCOUNTER — Other Ambulatory Visit: Payer: Self-pay | Admitting: Family Medicine

## 2018-08-16 NOTE — Anesthesia Preprocedure Evaluation (Addendum)
Anesthesia Evaluation  Patient identified by MRN, date of birth, ID band Patient awake    Reviewed: Allergy & Precautions, H&P , NPO status , Patient's Chart, lab work & pertinent test results  History of Anesthesia Complications (+) PONV  Airway Mallampati: III  TM Distance: >3 FB Neck ROM: Full    Dental no notable dental hx. (+) Teeth Intact, Dental Advisory Given   Pulmonary asthma ,    Pulmonary exam normal breath sounds clear to auscultation       Cardiovascular Exercise Tolerance: Good hypertension, Pt. on medications  Rhythm:Regular Rate:Normal     Neuro/Psych  Headaches, Anxiety Depression    GI/Hepatic Neg liver ROS, GERD  Controlled,  Endo/Other  Morbid obesity  Renal/GU negative Renal ROS  negative genitourinary   Musculoskeletal  (+) Arthritis , Fibromyalgia -  Abdominal   Peds  Hematology negative hematology ROS (+)   Anesthesia Other Findings   Reproductive/Obstetrics negative OB ROS                            Anesthesia Physical Anesthesia Plan  ASA: III  Anesthesia Plan: General   Post-op Pain Management:    Induction: Intravenous  PONV Risk Score and Plan: 4 or greater and Ondansetron, Dexamethasone and Midazolam  Airway Management Planned: Oral ETT  Additional Equipment:   Intra-op Plan:   Post-operative Plan: Extubation in OR  Informed Consent: I have reviewed the patients History and Physical, chart, labs and discussed the procedure including the risks, benefits and alternatives for the proposed anesthesia with the patient or authorized representative who has indicated his/her understanding and acceptance.   Dental advisory given  Plan Discussed with: CRNA  Anesthesia Plan Comments:         Anesthesia Quick Evaluation

## 2018-08-16 NOTE — Telephone Encounter (Signed)
These were refilled on 12.6.19 by CN while TA was out of the office/thx dmf

## 2018-08-17 ENCOUNTER — Encounter (HOSPITAL_COMMUNITY): Payer: Self-pay | Admitting: *Deleted

## 2018-08-17 ENCOUNTER — Encounter (HOSPITAL_COMMUNITY)
Admission: RE | Disposition: A | Discharge status: Home / Self Care | Payer: Self-pay | Source: Ambulatory Visit | Attending: Neurosurgery

## 2018-08-17 ENCOUNTER — Other Ambulatory Visit: Payer: Self-pay

## 2018-08-17 ENCOUNTER — Inpatient Hospital Stay (HOSPITAL_COMMUNITY): Payer: BC Managed Care – PPO

## 2018-08-17 ENCOUNTER — Inpatient Hospital Stay (HOSPITAL_COMMUNITY): Payer: BC Managed Care – PPO | Admitting: Anesthesiology

## 2018-08-17 ENCOUNTER — Inpatient Hospital Stay (HOSPITAL_COMMUNITY)
Admission: RE | Admit: 2018-08-17 | Discharge: 2018-08-18 | DRG: 455 | Disposition: A | Discharge status: Home / Self Care | Payer: BC Managed Care – PPO | Source: Ambulatory Visit | Attending: Neurosurgery | Admitting: Neurosurgery

## 2018-08-17 DIAGNOSIS — Z888 Allergy status to other drugs, medicaments and biological substances status: Secondary | ICD-10-CM | POA: Diagnosis not present

## 2018-08-17 DIAGNOSIS — Z8371 Family history of colonic polyps: Secondary | ICD-10-CM | POA: Diagnosis not present

## 2018-08-17 DIAGNOSIS — M5416 Radiculopathy, lumbar region: Secondary | ICD-10-CM | POA: Diagnosis present

## 2018-08-17 DIAGNOSIS — M797 Fibromyalgia: Secondary | ICD-10-CM | POA: Diagnosis present

## 2018-08-17 DIAGNOSIS — I10 Essential (primary) hypertension: Secondary | ICD-10-CM | POA: Diagnosis present

## 2018-08-17 DIAGNOSIS — Z8249 Family history of ischemic heart disease and other diseases of the circulatory system: Secondary | ICD-10-CM | POA: Diagnosis not present

## 2018-08-17 DIAGNOSIS — Z9049 Acquired absence of other specified parts of digestive tract: Secondary | ICD-10-CM | POA: Diagnosis not present

## 2018-08-17 DIAGNOSIS — Z90710 Acquired absence of both cervix and uterus: Secondary | ICD-10-CM

## 2018-08-17 DIAGNOSIS — Z91040 Latex allergy status: Secondary | ICD-10-CM | POA: Diagnosis not present

## 2018-08-17 DIAGNOSIS — M48061 Spinal stenosis, lumbar region without neurogenic claudication: Secondary | ICD-10-CM | POA: Diagnosis present

## 2018-08-17 DIAGNOSIS — Z885 Allergy status to narcotic agent status: Secondary | ICD-10-CM

## 2018-08-17 DIAGNOSIS — Z79891 Long term (current) use of opiate analgesic: Secondary | ICD-10-CM

## 2018-08-17 DIAGNOSIS — Z91048 Other nonmedicinal substance allergy status: Secondary | ICD-10-CM

## 2018-08-17 DIAGNOSIS — M4316 Spondylolisthesis, lumbar region: Principal | ICD-10-CM | POA: Diagnosis present

## 2018-08-17 DIAGNOSIS — M4310 Spondylolisthesis, site unspecified: Secondary | ICD-10-CM | POA: Diagnosis present

## 2018-08-17 DIAGNOSIS — Z7951 Long term (current) use of inhaled steroids: Secondary | ICD-10-CM | POA: Diagnosis not present

## 2018-08-17 DIAGNOSIS — Z800 Family history of malignant neoplasm of digestive organs: Secondary | ICD-10-CM

## 2018-08-17 DIAGNOSIS — M549 Dorsalgia, unspecified: Secondary | ICD-10-CM | POA: Diagnosis present

## 2018-08-17 DIAGNOSIS — Z419 Encounter for procedure for purposes other than remedying health state, unspecified: Secondary | ICD-10-CM

## 2018-08-17 HISTORY — PX: LUMBAR PERCUTANEOUS PEDICLE SCREW 1 LEVEL: SHX5560

## 2018-08-17 HISTORY — PX: ANTERIOR LAT LUMBAR FUSION: SHX1168

## 2018-08-17 SURGERY — ANTERIOR LATERAL LUMBAR FUSION 1 LEVEL
Anesthesia: General | Site: Flank | Laterality: Left

## 2018-08-17 MED ORDER — BUPIVACAINE HCL (PF) 0.25 % IJ SOLN
INTRAMUSCULAR | Status: DC | PRN
  Administered 2018-08-17: 30 mL

## 2018-08-17 MED ORDER — LACTATED RINGERS IV SOLN
INTRAVENOUS | Status: DC | PRN
  Administered 2018-08-17: 07:00:00 via INTRAVENOUS

## 2018-08-17 MED ORDER — ALPRAZOLAM 0.25 MG PO TABS
0.2500 mg | ORAL_TABLET | Freq: Every day | ORAL | Status: DC
  Administered 2018-08-18: 0.5 mg via ORAL
  Filled 2018-08-17 (×2): qty 2

## 2018-08-17 MED ORDER — KETAMINE HCL 50 MG/5ML IJ SOSY
PREFILLED_SYRINGE | INTRAMUSCULAR | Status: AC
  Filled 2018-08-17: qty 5

## 2018-08-17 MED ORDER — CHLORHEXIDINE GLUCONATE CLOTH 2 % EX PADS: 6.0000 | MEDICATED_PAD | Freq: Once | CUTANEOUS | Status: DC

## 2018-08-17 MED ORDER — OXYMORPHONE HCL ER 5 MG PO TB12: 5.0000 mg | ORAL_TABLET | ORAL | Status: DC

## 2018-08-17 MED ORDER — CEFAZOLIN SODIUM-DEXTROSE 1-4 GM/50ML-% IV SOLN
1.0000 g | Freq: Three times a day (TID) | INTRAVENOUS | Status: AC
  Administered 2018-08-17 (×2): 1 g via INTRAVENOUS
  Filled 2018-08-17 (×2): qty 50

## 2018-08-17 MED ORDER — FLEET ENEMA 7-19 GM/118ML RE ENEM: 1.0000 | ENEMA | Freq: Once | RECTAL | Status: DC | PRN

## 2018-08-17 MED ORDER — MIDAZOLAM HCL 5 MG/5ML IJ SOLN
INTRAMUSCULAR | Status: DC | PRN
  Administered 2018-08-17: 2 mg via INTRAVENOUS

## 2018-08-17 MED ORDER — LIDOCAINE 2% (20 MG/ML) 5 ML SYRINGE
INTRAMUSCULAR | Status: AC
  Filled 2018-08-17: qty 15

## 2018-08-17 MED ORDER — FENTANYL CITRATE (PF) 100 MCG/2ML IJ SOLN
INTRAMUSCULAR | Status: AC
  Administered 2018-08-17: 100 ug via INTRAVENOUS
  Filled 2018-08-17: qty 2

## 2018-08-17 MED ORDER — BISACODYL 10 MG RE SUPP: 10.0000 mg | Freq: Every day | RECTAL | Status: DC | PRN

## 2018-08-17 MED ORDER — DULOXETINE HCL 30 MG PO CPEP
60.0000 mg | ORAL_CAPSULE | Freq: Every day | ORAL | Status: DC
  Administered 2018-08-17: 60 mg via ORAL
  Filled 2018-08-17: qty 2

## 2018-08-17 MED ORDER — DEXAMETHASONE SODIUM PHOSPHATE 10 MG/ML IJ SOLN
INTRAMUSCULAR | Status: DC | PRN
  Administered 2018-08-17: 10 mg via INTRAVENOUS

## 2018-08-17 MED ORDER — SCOPOLAMINE 1 MG/3DAYS TD PT72
MEDICATED_PATCH | TRANSDERMAL | Status: DC | PRN
  Administered 2018-08-17: 1 via TRANSDERMAL

## 2018-08-17 MED ORDER — FLUTICASONE PROPIONATE 50 MCG/ACT NA SUSP: 2.0000 | Freq: Every day | NASAL | Status: DC

## 2018-08-17 MED ORDER — MENTHOL 3 MG MT LOZG: 1.0000 | LOZENGE | OROMUCOSAL | Status: DC | PRN

## 2018-08-17 MED ORDER — THROMBIN 20000 UNITS EX SOLR
CUTANEOUS | Status: AC
  Filled 2018-08-17: qty 20000

## 2018-08-17 MED ORDER — LIDOCAINE 2% (20 MG/ML) 5 ML SYRINGE
INTRAMUSCULAR | Status: DC | PRN
  Administered 2018-08-17: 80 mg via INTRAVENOUS

## 2018-08-17 MED ORDER — TRIAMTERENE-HCTZ 37.5-25 MG PO TABS
1.0000 | ORAL_TABLET | Freq: Every day | ORAL | Status: DC
  Filled 2018-08-17: qty 1

## 2018-08-17 MED ORDER — ACETAMINOPHEN 10 MG/ML IV SOLN
INTRAVENOUS | Status: AC
  Filled 2018-08-17: qty 300

## 2018-08-17 MED ORDER — SODIUM CHLORIDE 0.9 % IV SOLN
INTRAVENOUS | Status: DC | PRN
  Administered 2018-08-17: 09:00:00

## 2018-08-17 MED ORDER — GLYCOPYRROLATE PF 0.2 MG/ML IJ SOSY
PREFILLED_SYRINGE | INTRAMUSCULAR | Status: AC
  Filled 2018-08-17: qty 1

## 2018-08-17 MED ORDER — ESTROGENS CONJUGATED 0.3 MG PO TABS
0.3000 mg | ORAL_TABLET | Freq: Every day | ORAL | Status: DC
  Administered 2018-08-18: 0.3 mg via ORAL
  Filled 2018-08-17: qty 1

## 2018-08-17 MED ORDER — BUPIVACAINE HCL (PF) 0.25 % IJ SOLN
INTRAMUSCULAR | Status: AC
  Filled 2018-08-17: qty 30

## 2018-08-17 MED ORDER — OXYCODONE HCL 5 MG PO TABS
ORAL_TABLET | ORAL | Status: AC
  Filled 2018-08-17: qty 2

## 2018-08-17 MED ORDER — ONDANSETRON HCL 4 MG PO TABS: 4.0000 mg | ORAL_TABLET | Freq: Four times a day (QID) | ORAL | Status: DC | PRN

## 2018-08-17 MED ORDER — SCOPOLAMINE 1 MG/3DAYS TD PT72
MEDICATED_PATCH | TRANSDERMAL | Status: AC
  Filled 2018-08-17: qty 1

## 2018-08-17 MED ORDER — SODIUM CHLORIDE 0.9% FLUSH: 3.0000 mL | Freq: Two times a day (BID) | INTRAVENOUS | Status: DC

## 2018-08-17 MED ORDER — 0.9 % SODIUM CHLORIDE (POUR BTL) OPTIME
TOPICAL | Status: DC | PRN
  Administered 2018-08-17: 1000 mL

## 2018-08-17 MED ORDER — FENTANYL CITRATE (PF) 100 MCG/2ML IJ SOLN
INTRAMUSCULAR | Status: DC | PRN
  Administered 2018-08-17 (×2): 25 ug via INTRAVENOUS
  Administered 2018-08-17: 100 ug via INTRAVENOUS
  Administered 2018-08-17: 50 ug via INTRAVENOUS
  Administered 2018-08-17: 25 ug via INTRAVENOUS
  Administered 2018-08-17: 100 ug via INTRAVENOUS
  Administered 2018-08-17: 50 ug via INTRAVENOUS
  Administered 2018-08-17: 25 ug via INTRAVENOUS

## 2018-08-17 MED ORDER — FENTANYL CITRATE (PF) 100 MCG/2ML IJ SOLN
100.0000 ug | Freq: Once | INTRAMUSCULAR | Status: AC
  Administered 2018-08-17: 100 ug via INTRAVENOUS

## 2018-08-17 MED ORDER — ACETAMINOPHEN 325 MG PO TABS
650.0000 mg | ORAL_TABLET | ORAL | Status: DC | PRN
  Administered 2018-08-17: 650 mg via ORAL
  Filled 2018-08-17: qty 2

## 2018-08-17 MED ORDER — OXYCODONE HCL 5 MG PO TABS
10.0000 mg | ORAL_TABLET | ORAL | Status: DC | PRN
  Administered 2018-08-17 – 2018-08-18 (×7): 10 mg via ORAL
  Filled 2018-08-17 (×6): qty 2

## 2018-08-17 MED ORDER — THROMBIN 5000 UNITS EX SOLR
CUTANEOUS | Status: AC
  Filled 2018-08-17: qty 5000

## 2018-08-17 MED ORDER — HYDROMORPHONE HCL 1 MG/ML IJ SOLN
INTRAMUSCULAR | Status: AC
  Filled 2018-08-17: qty 2

## 2018-08-17 MED ORDER — FENTANYL CITRATE (PF) 250 MCG/5ML IJ SOLN
INTRAMUSCULAR | Status: AC
  Filled 2018-08-17: qty 5

## 2018-08-17 MED ORDER — DEXAMETHASONE SODIUM PHOSPHATE 10 MG/ML IJ SOLN
INTRAMUSCULAR | Status: AC
  Filled 2018-08-17: qty 3

## 2018-08-17 MED ORDER — SUCCINYLCHOLINE CHLORIDE 20 MG/ML IJ SOLN
INTRAMUSCULAR | Status: DC | PRN
  Administered 2018-08-17: 100 mg via INTRAVENOUS

## 2018-08-17 MED ORDER — SODIUM CHLORIDE 0.9 % IV SOLN
0.0125 ug/kg/min | INTRAVENOUS | Status: AC
  Administered 2018-08-17: .15 ug/kg/min via INTRAVENOUS
  Filled 2018-08-17: qty 2000

## 2018-08-17 MED ORDER — HYDROCODONE-ACETAMINOPHEN 10-325 MG PO TABS: 1.0000 | ORAL_TABLET | ORAL | Status: DC | PRN

## 2018-08-17 MED ORDER — HYDROMORPHONE HCL 1 MG/ML IJ SOLN
0.2500 mg | INTRAMUSCULAR | Status: DC | PRN
  Administered 2018-08-17 (×3): 0.5 mg via INTRAVENOUS

## 2018-08-17 MED ORDER — ALBUTEROL SULFATE HFA 108 (90 BASE) MCG/ACT IN AERS: 1.0000 | INHALATION_SPRAY | RESPIRATORY_TRACT | Status: DC | PRN

## 2018-08-17 MED ORDER — POLYETHYLENE GLYCOL 3350 17 G PO PACK: 17.0000 g | PACK | Freq: Every day | ORAL | Status: DC | PRN

## 2018-08-17 MED ORDER — DIAZEPAM 5 MG PO TABS
5.0000 mg | ORAL_TABLET | Freq: Four times a day (QID) | ORAL | Status: DC | PRN
  Administered 2018-08-17: 10 mg via ORAL
  Administered 2018-08-17: 5 mg via ORAL
  Administered 2018-08-18: 10 mg via ORAL
  Filled 2018-08-17 (×2): qty 2

## 2018-08-17 MED ORDER — SODIUM CHLORIDE 0.9 % IV SOLN: 250.0000 mL | INTRAVENOUS | Status: DC

## 2018-08-17 MED ORDER — MORPHINE SULFATE 15 MG PO TABS: 15.0000 mg | ORAL_TABLET | ORAL | Status: DC

## 2018-08-17 MED ORDER — ONDANSETRON HCL 4 MG/2ML IJ SOLN
INTRAMUSCULAR | Status: AC
  Filled 2018-08-17: qty 6

## 2018-08-17 MED ORDER — ONDANSETRON HCL 4 MG/2ML IJ SOLN: 4.0000 mg | Freq: Four times a day (QID) | INTRAMUSCULAR | Status: DC | PRN

## 2018-08-17 MED ORDER — MOMETASONE FURO-FORMOTEROL FUM 200-5 MCG/ACT IN AERO: 2.0000 | INHALATION_SPRAY | Freq: Every day | RESPIRATORY_TRACT | Status: DC

## 2018-08-17 MED ORDER — SUCCINYLCHOLINE CHLORIDE 200 MG/10ML IV SOSY
PREFILLED_SYRINGE | INTRAVENOUS | Status: AC
  Filled 2018-08-17: qty 10

## 2018-08-17 MED ORDER — MORPHINE SULFATE 15 MG PO TABS
15.0000 mg | ORAL_TABLET | ORAL | Status: DC
  Administered 2018-08-18: 15 mg via ORAL
  Filled 2018-08-17: qty 1

## 2018-08-17 MED ORDER — DEXAMETHASONE SODIUM PHOSPHATE 10 MG/ML IJ SOLN
10.0000 mg | INTRAMUSCULAR | Status: DC
  Filled 2018-08-17: qty 1

## 2018-08-17 MED ORDER — SODIUM CHLORIDE 0.9 % IV SOLN
INTRAVENOUS | Status: DC | PRN
  Administered 2018-08-17: 40 ug/min via INTRAVENOUS

## 2018-08-17 MED ORDER — ZOLPIDEM TARTRATE 5 MG PO TABS
10.0000 mg | ORAL_TABLET | Freq: Every day | ORAL | Status: DC
  Administered 2018-08-17: 10 mg via ORAL
  Filled 2018-08-17: qty 2

## 2018-08-17 MED ORDER — HYDROMORPHONE HCL 1 MG/ML IJ SOLN: 1.0000 mg | INTRAMUSCULAR | Status: DC | PRN

## 2018-08-17 MED ORDER — PHENOL 1.4 % MT LIQD: 1.0000 | OROMUCOSAL | Status: DC | PRN

## 2018-08-17 MED ORDER — CYCLOBENZAPRINE HCL 10 MG PO TABS
10.0000 mg | ORAL_TABLET | Freq: Two times a day (BID) | ORAL | Status: DC | PRN
  Administered 2018-08-17 – 2018-08-18 (×3): 10 mg via ORAL
  Filled 2018-08-17 (×3): qty 1

## 2018-08-17 MED ORDER — ACETAMINOPHEN 650 MG RE SUPP: 650.0000 mg | RECTAL | Status: DC | PRN

## 2018-08-17 MED ORDER — MIDAZOLAM HCL 2 MG/2ML IJ SOLN
INTRAMUSCULAR | Status: AC
  Filled 2018-08-17: qty 2

## 2018-08-17 MED ORDER — LOSARTAN POTASSIUM 50 MG PO TABS
50.0000 mg | ORAL_TABLET | Freq: Every day | ORAL | Status: DC
  Administered 2018-08-17: 50 mg via ORAL
  Filled 2018-08-17 (×2): qty 1

## 2018-08-17 MED ORDER — PROPOFOL 10 MG/ML IV BOLUS
INTRAVENOUS | Status: DC | PRN
  Administered 2018-08-17: 150 mg via INTRAVENOUS

## 2018-08-17 MED ORDER — KETAMINE HCL 50 MG/ML IJ SOLN
INTRAMUSCULAR | Status: DC | PRN
  Administered 2018-08-17: 20 mg via INTRAMUSCULAR
  Administered 2018-08-17: 30 mg via INTRAMUSCULAR

## 2018-08-17 MED ORDER — CEFAZOLIN SODIUM-DEXTROSE 2-4 GM/100ML-% IV SOLN
2.0000 g | INTRAVENOUS | Status: AC
  Administered 2018-08-17: 2 g via INTRAVENOUS
  Filled 2018-08-17: qty 100

## 2018-08-17 MED ORDER — SODIUM CHLORIDE 0.9% FLUSH: 3.0000 mL | INTRAVENOUS | Status: DC | PRN

## 2018-08-17 MED ORDER — DIAZEPAM 5 MG PO TABS
ORAL_TABLET | ORAL | Status: AC
  Filled 2018-08-17: qty 1

## 2018-08-17 MED ORDER — PROPOFOL 10 MG/ML IV BOLUS
INTRAVENOUS | Status: AC
  Filled 2018-08-17: qty 40

## 2018-08-17 MED ORDER — ACETAMINOPHEN 10 MG/ML IV SOLN
INTRAVENOUS | Status: DC | PRN
  Administered 2018-08-17: 1000 mg via INTRAVENOUS

## 2018-08-17 SURGICAL SUPPLY — 55 items
BAG DECANTER FOR FLEXI CONT (MISCELLANEOUS) ×4 IMPLANT
BENZOIN TINCTURE PRP APPL 2/3 (GAUZE/BANDAGES/DRESSINGS) ×4 IMPLANT
BONE MATRIX OSTEOCEL PRO MED (Bone Implant) ×4 IMPLANT
CLOSURE WOUND 1/2 X4 (GAUZE/BANDAGES/DRESSINGS) ×1
CONT SPEC 4OZ CLIKSEAL STRL BL (MISCELLANEOUS) ×4 IMPLANT
COVER BACK TABLE 60X90IN (DRAPES) ×4 IMPLANT
DERMABOND ADVANCED (GAUZE/BANDAGES/DRESSINGS) ×4
DERMABOND ADVANCED .7 DNX12 (GAUZE/BANDAGES/DRESSINGS) ×4 IMPLANT
DRAPE C-ARM 42X72 X-RAY (DRAPES) ×4 IMPLANT
DRAPE C-ARMOR (DRAPES) ×4 IMPLANT
DRAPE LAPAROTOMY 100X72X124 (DRAPES) ×4 IMPLANT
DRSG OPSITE POSTOP 3X4 (GAUZE/BANDAGES/DRESSINGS) ×4 IMPLANT
DRSG OPSITE POSTOP 4X6 (GAUZE/BANDAGES/DRESSINGS) ×8 IMPLANT
ELECT BLADE 4.0 EZ CLEAN MEGAD (MISCELLANEOUS)
ELECT BLADE 6.5 EXT (BLADE) ×4 IMPLANT
ELECT REM PT RETURN 9FT ADLT (ELECTROSURGICAL) ×4
ELECTRODE BLDE 4.0 EZ CLN MEGD (MISCELLANEOUS) IMPLANT
ELECTRODE REM PT RTRN 9FT ADLT (ELECTROSURGICAL) ×2 IMPLANT
EVACUATOR 1/8 PVC DRAIN (DRAIN) IMPLANT
GAUZE 4X4 16PLY RFD (DISPOSABLE) IMPLANT
GAUZE SPONGE 4X4 12PLY STRL (GAUZE/BANDAGES/DRESSINGS) IMPLANT
GLOVE SS N UNI LF 6.5 STRL (GLOVE) ×8 IMPLANT
GLOVE SS N UNI LF 7.0 STRL (GLOVE) ×8 IMPLANT
GLOVE SS N UNI LF 7.5 STRL (GLOVE) ×8 IMPLANT
GLOVE SS N UNI LF STER SZ 9 (GLOVE) ×8 IMPLANT
GOWN STRL REUS W/ TWL LRG LVL3 (GOWN DISPOSABLE) ×6 IMPLANT
GOWN STRL REUS W/ TWL XL LVL3 (GOWN DISPOSABLE) ×4 IMPLANT
GOWN STRL REUS W/TWL 2XL LVL3 (GOWN DISPOSABLE) IMPLANT
GOWN STRL REUS W/TWL LRG LVL3 (GOWN DISPOSABLE) ×6
GOWN STRL REUS W/TWL XL LVL3 (GOWN DISPOSABLE) ×4
GUIDEWIRE NITINOL BEVEL TIP (WIRE) ×8 IMPLANT
KIT BASIN OR (CUSTOM PROCEDURE TRAY) ×4 IMPLANT
KIT DILATOR XLIF 5 (KITS) ×3 IMPLANT
KIT SURGICAL ACCESS MAXCESS 4 (KITS) ×4 IMPLANT
KIT TURNOVER KIT B (KITS) ×4 IMPLANT
KIT XLIF (KITS) ×1
MODULE NVM5 NEXT GEN EMG (NEEDLE) ×4 IMPLANT
MODULUS XLW 10X22X50MM 10DEG (Spine Construct) ×4 IMPLANT
NEEDLE HYPO 22GX1.5 SAFETY (NEEDLE) ×4 IMPLANT
NEEDLE I-PASS III (NEEDLE) ×4 IMPLANT
NS IRRIG 1000ML POUR BTL (IV SOLUTION) ×4 IMPLANT
PACK LAMINECTOMY NEURO (CUSTOM PROCEDURE TRAY) ×4 IMPLANT
ROD RELINE MAS LORD 5.5X50 (Rod) ×4 IMPLANT
SCREW LOCK RELINE 5.5 TULIP (Screw) ×8 IMPLANT
SCREW MAS RELINE POLY 6.5X40 (Screw) ×4 IMPLANT
SCREW RELINE MAS POLY 5.5X40 (Screw) ×4 IMPLANT
SPONGE LAP 4X18 RFD (DISPOSABLE) IMPLANT
STRIP CLOSURE SKIN 1/2X4 (GAUZE/BANDAGES/DRESSINGS) ×3 IMPLANT
SUT VIC AB 2-0 CT1 18 (SUTURE) ×8 IMPLANT
SUT VIC AB 3-0 SH 8-18 (SUTURE) ×8 IMPLANT
TAPE CLOTH 3X10 TAN LF (GAUZE/BANDAGES/DRESSINGS) ×12 IMPLANT
TOWEL GREEN STERILE (TOWEL DISPOSABLE) ×4 IMPLANT
TOWEL GREEN STERILE FF (TOWEL DISPOSABLE) ×4 IMPLANT
TRAY FOLEY MTR SLVR 18FR LF (CATHETERS) ×4 IMPLANT
WATER STERILE IRR 1000ML POUR (IV SOLUTION) ×4 IMPLANT

## 2018-08-17 NOTE — Transfer of Care (Signed)
Immediate Anesthesia Transfer of Care Note  Patient: Melissa Allen  Procedure(s) Performed: LUMBAR THREE-FOUR ANTERIOR LATERAL INTERBODY FUSION (Left Flank) LUMBAR THREE-FOUR LUMBAR PERCUTANEOUS PEDICLE SCREW (Left Back)  Patient Location: PACU  Anesthesia Type:General  Level of Consciousness: awake, alert , oriented and patient cooperative  Airway & Oxygen Therapy: Patient Spontanous Breathing and Patient connected to face mask oxygen  Post-op Assessment: Report given to RN and Post -op Vital signs reviewed and stable  Post vital signs: Reviewed and stable  Last Vitals:  Vitals Value Taken Time  BP 148/87 08/17/2018 10:10 AM  Temp    Pulse 114 08/17/2018 10:12 AM  Resp 23 08/17/2018 10:12 AM  SpO2 90 % 08/17/2018 10:12 AM  Vitals shown include unvalidated device data.  Last Pain:  Vitals:   08/17/18 0712  TempSrc:   PainSc: 8       Patients Stated Pain Goal: 3 ([id number])  Complications: No apparent anesthesia complications

## 2018-08-17 NOTE — Anesthesia Procedure Notes (Signed)
Procedure Name: Intubation Date/Time: 08/17/2018 8:09 AM Performed by: Cleda Daub, CRNA Pre-anesthesia Checklist: Patient identified, Emergency Drugs available, Suction available and Patient being monitored Patient Re-evaluated:Patient Re-evaluated prior to induction Oxygen Delivery Method: Circle system utilized Preoxygenation: Pre-oxygenation with 100% oxygen Induction Type: IV induction Laryngoscope Size: Mac and 3 Grade View: Grade II Tube type: Oral Tube size: 7.0 mm Number of attempts: 1 Airway Equipment and Method: Stylet Placement Confirmation: ETT inserted through vocal cords under direct vision,  positive ETCO2 and breath sounds checked- equal and bilateral Secured at: 21 cm Tube secured with: Tape Dental Injury: Teeth and Oropharynx as per pre-operative assessment

## 2018-08-17 NOTE — Progress Notes (Signed)
Orthopedic Tech Progress Note Patient Details:  Melissa Allen 14-Jan-1966 [id number]  Patient stated that husband has brace. Patient ID: Melissa Allen, female   DOB: Dec 20, 1965, 52 y.o.   MRN: [id number]   Braulio Bosch 08/17/2018, 12:47 PM

## 2018-08-17 NOTE — H&P (Signed)
Melissa Allen is an 52 y.o. female.   Chief Complaint: Back pain HPI: 52 year old female with chronic and worsening back pain with radiation into both lower extremities.  Patient is status post prior L4-5 decompression and fusion.  Patient now with unstable spondylolisthesis at L3-4 with foraminal stenosis.  Patient presents now for decompression and fusion at L3-4 in hopes of improving her symptoms.  Past Medical History:  Diagnosis Date  . Allergy   . Anginal pain ([place])    one episode 4-5 years ago-[facility] [place]-nonspecific-no problems since-  . Anxiety   . Arthritis    multiple areas of arthritis- DDD  . Asthma    recent admit to ER 01/11/12 for exac of asthma  . Calcaneal fracture   . Duplicated ureter, right   . Fibromyalgia   . Hypertension   . Kidney stones   . Obesity (BMI 30.0-34.9)   . Pancreatitis 1986  . PONV (postoperative nausea and vomiting)   . Poor venous access    hard to start iv-  . 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 Shortness of breath dyspnea   . Wears glasses     Past Surgical History:  Procedure Laterality Date  . ABDOMINAL HYSTERECTOMY    . APPENDECTOMY  08/05/2017  . BLADDER SUSPENSION  03/01/2012   Procedure: TRANSVAGINAL TAPE (TVT) PROCEDURE;  Surgeon: Emily Filbert, MD;  Location: [facility];  Service: Gynecology;  Laterality: N/A;  . BUNIONECTOMY  1995   Right  . CHOLECYSTECTOMY  late 1980's      . CYSTOSCOPY  03/01/2012   Procedure: CYSTOSCOPY;  Surgeon: Emily Filbert, MD;  Location: [facility];  Service: Gynecology;  Laterality: N/A;  . CYSTOSCOPY W/ URETERAL STENT PLACEMENT Left 12/15/2014   Procedure: CYSTOSCOPY, LEFT URETEROSCOPYU WITH STONE EXTRACTION;  Surgeon: Irine Seal, MD;  Location: [facility];  Service: Urology;  Laterality: Left;  . CYSTOSCOPY W/ URETERAL STENT PLACEMENT Left 10/02/2016   Procedure: CYSTOSCOPY WITH RETROGRADE PYELOGRAM/URETERAL STENT PLACEMENT  ureteroscopy, basket extraction;  Surgeon: Cleon Gustin, MD;  Location: [facility];  Service: Urology;  Laterality: Left;  . HARDWARE REMOVAL N/A 12/22/2016   Procedure: Lumbar Four-Five Removal of Hardware;  Surgeon: Earnie Larsson, MD;  Location: [facility];  Service: Neurosurgery;  Laterality: N/A;  . INCISION AND DRAINAGE ABSCESS Left 07/19/2013   Procedure: INCISION AND DRAINAGE LEFT LOWER EXTERMITY HEMATOMA;  Surgeon: Imogene Burn. Georgette Dover, MD;  Location: [facility];  Service: General;  Laterality: Left;  Excision left lower leg mass  . KNEE CARTILAGE SURGERY     left  . KNEE SURGERY Left    x 2, Murphy/Sue, corrected Patella displacement   . LAMINECTOMY    . LAPAROSCOPIC APPENDECTOMY N/A 08/05/2017   Procedure: APPENDECTOMY LAPAROSCOPIC;  Surgeon: Kinsinger, Arta Bruce, MD;  Location: [facility];  Service: General;  Laterality: N/A;  . LUMBAR LAMINECTOMY  10/06   L4-5, Ray  . R compressed pronator Right    Sypher, R arm  . RESECTION DISTAL CLAVICAL Right 06/30/2017   Procedure: RIGHT RESECTION DISTAL CLAVICAL;  Surgeon: Melrose Nakayama, MD;  Location: [facility];  Service: Orthopedics;  Laterality: Right;  . SHOULDER ACROMIOPLASTY Right 06/30/2017   Procedure: RIGHT SHOULDER ACROMIOPLASTY;  Surgeon: Melrose Nakayama, MD;  Location: [facility];  Service: Orthopedics;  Laterality: Right;  . SHOULDER ARTHROSCOPY Right 06/30/2017   Procedure: ARTHROSCOPY DEBRIDEMENT RIGHT SHOULDER;  Surgeon: Melrose Nakayama, MD;  Location: [facility];  Service: Orthopedics;  Laterality: Right;  . SPINAL FUSION    . TENDON REPAIR  2013  . TENDON REPAIR Right 02/01/12  . TONSILLECTOMY  age 63    Family History  Problem Relation Age of Onset  . Cancer Mother        pancreatic cancer  . Heart disease Father 32  . Colon polyps Father   . Colon cancer Paternal Grandfather   . Colon cancer Maternal Grandmother   . Uterine cancer Unknown        Grandmother  . Breast cancer Unknown        Grandmother  . Ovarian cancer  Unknown        Grandmother  . Diabetes Unknown        mother  . Other Neg Hx    Social History:  reports that she has never smoked. She has never used smokeless tobacco. She reports that she drinks about 7.0 standard drinks of alcohol per week. She reports that she does not use drugs.  Allergies:  Allergies  Allergen Reactions  . Latex Itching, Swelling and Dermatitis    SWELLING FROM FACE MASK RING AFTER LAST SURGERY   . Lisinopril Cough       . Codeine Nausea And Vomiting  . Hydrocodone Itching  . Tape Rash    TOLERATES PAPER TAPE ONLY NO PINK SURGICAL TAPE EITHER, EKG Leads     Medications Prior to Admission  Medication Sig Dispense Refill  . ALPRAZolam (XANAX) 0.25 MG tablet Take 1-2 tablets (0.25-0.5 mg total) by mouth daily. 6 tablet 0  . cyclobenzaprine (FLEXERIL) 10 MG tablet Take 10 mg by mouth 2 (two) times daily as needed for muscle spasms (muscle spasms). Take 1 tablet (10 mg) daily at bedtime, may take an additional tablet during the day as needed for muscle spasms    . DULoxetine (CYMBALTA) 60 MG capsule Take 1 capsule (60 mg total) by mouth at bedtime. 90 capsule 0  . fluticasone (FLONASE) 50 MCG/ACT nasal spray USE 2 SPRAYS INTO EACH NOSTRIL ONCE DAILY AS DIRECTED (Patient taking differently: Place 2 sprays into both nostrils daily. ) 16 g 11  . ibuprofen (ADVIL,MOTRIN) 200 MG tablet You can safely take 3 tablets every 6 hours as needed for pain.  He can alternate this with plain Tylenol.  You can also alternate with the prescribed narcotic.  I would recommend to use the Tylenol and ibuprofen as your first line pain medications. (Patient taking differently: Take 600 mg by mouth every 6 (six) hours as needed (pain). You can safely take 3 tablets every 6 hours as needed for pain.  He can alternate this with plain Tylenol.  You can also alternate with the prescribed narcotic.  I would recommend to use the Tylenol and ibuprofen as your first line pain medications.)    .  losartan (COZAAR) 50 MG tablet Take 1 tablet (50 mg total) by mouth daily. 90 tablet 0  . mometasone-formoterol (DULERA) 200-5 MCG/ACT AERO Inhale 2 puffs into the lungs 2 (two) times daily. (Patient taking differently: Inhale 2 puffs into the lungs daily. ) 1 Inhaler 3  . oxymorphone (OPANA ER) 5 MG 12 hr tablet Take 5 mg by mouth every 4 (four) hours.    Marland Kitchen PREMARIN 0.3 MG tablet TAKE 1 TABLET BY MOUTH DAILY (TAKE FOR 21 DAYS THEN DO NOT TAKE FOR 7DAYS) (Patient taking differently: Take 0.3 mg by mouth daily. ) 90 tablet 3  . triamterene-hydrochlorothiazide (MAXZIDE-25) 37.5-25  MG tablet Take 1 tablet by mouth daily. 90 tablet 0  . zolpidem (AMBIEN) 10 MG tablet Take 1 tablet (10 mg total) by mouth at bedtime. 3 tablet 0  . albuterol (PROAIR HFA) 108 (90 Base) MCG/ACT inhaler INHALE TWO PUFFS EVERY FOUR HOURS AS NEEDED SHORTNESS OF BREATH 8.5 g 2  . ipratropium-albuterol (DUONEB) 0.5-2.5 (3) MG/3ML SOLN Take 3 mLs by nebulization every 4 (four) hours as needed. (Patient taking differently: Take 3 mLs by nebulization every 4 (four) hours as needed (shortness of breath/wheezing). ) 360 mL 1    No results found for this or any previous visit (from the past 48 hour(s)). No results found.  Pertinent items noted in HPI and remainder of comprehensive ROS otherwise negative.  Blood pressure (!) 107/44, pulse 93, temperature 97.8 F (36.6 C), temperature source Oral, resp. rate 20, height 5\' 4"  (1.626 m), weight 90.7 kg, last menstrual period 12/21/2011, SpO2 96 %.  Patient is awake and alert.  She is oriented and appropriate.  Cranial nerve function is intact.  Motor and sensory function of the extremities reveals some mild weakness of quadriceps function bilaterally.  Sensory function is normal aside from some decreased sensation pinprick light touch in her left L4 and L3 dermatomes.  Reflexes are hypoactive but symmetric.  No evidence for long track signs.  Gait is antalgic.  Posture is flexed.   Examination head ears eyes nose throat is unremarkable her chest and abdomen are benign.  Extremities are free from injury or deformity. Assessment/Plan L3-4 unstable degenerative spondylolisthesis with stenosis.  Plan left L3-4 anterior lateral retroperitoneal decompression and fusion with interbody cage augmented with posterior percutaneous pedicle screw instrumentation.  Risks and benefits of been explained.  Patient wishes to proceed.  Mallie Mussel A Brinnley Lacap 08/17/2018, 7:45 AM

## 2018-08-17 NOTE — Anesthesia Postprocedure Evaluation (Signed)
Anesthesia Post Note  Patient: ANTAVIA TANDY  Procedure(s) Performed: LUMBAR THREE-FOUR ANTERIOR LATERAL INTERBODY FUSION (Left Flank) LUMBAR THREE-FOUR LUMBAR PERCUTANEOUS PEDICLE SCREW (Left Back)     Patient location during evaluation: PACU Anesthesia Type: General Level of consciousness: awake and alert Pain management: pain level controlled Vital Signs Assessment: post-procedure vital signs reviewed and stable Respiratory status: spontaneous breathing, nonlabored ventilation and respiratory function stable Cardiovascular status: blood pressure returned to baseline and stable Postop Assessment: no apparent nausea or vomiting Anesthetic complications: no    Last Vitals:  Vitals:   08/17/18 1142 08/17/18 1231  BP: 136/88   Pulse: 92 (!) 113  Resp: 16   Temp: 36.8 C   SpO2: 100% 96%    Last Pain:  Vitals:   08/17/18 1142  TempSrc: Oral  PainSc:                  Quran Vasco,W. EDMOND

## 2018-08-17 NOTE — Evaluation (Addendum)
Occupational Therapy Evaluation and Discharge Patient Details Name: Melissa Allen MRN: [id number] DOB: 05-01-1966 Today's Date: 08/17/2018    History of Present Illness 52 year old female status post prior L4-5 decompression and fusion surgery, Left L3-L4 anterior lateral retroperitoneal interbody decompression and fusion and L3/L4 pedicle screw fixation. PMH: anxiety, arthritis, asthma, fibromyalgia, HTN, obesity, seizures, shoulder arthroplasty R, lumbar laminecotmy, L knee sx.    Clinical Impression   PTA patient independent.  Admitted for above and limited by pain.  Patient educated on back precautions, brace mgmt and wear schedule, ADL compensatory techniques, AE/DME recommendations, energy conservation, safety and mobility.  Patient able to complete self care tasks with supervision to adhere to precautions, completing LB from supine as unable to complete figure 4 technique but educated on AE (reacher, long sponge, sock aide), transfers with supervision.  No further OT needs identified at this time.  OT will sign off.  Thank you for this referral.      Follow Up Recommendations  No OT follow up    Equipment Recommendations  None recommended by OT    Recommendations for Other Services       Precautions / Restrictions Precautions Precautions: Back Precaution Booklet Issued: Yes (comment) Precaution Comments: verbally reviewed with patient Required Braces or Orthoses: Spinal Brace Spinal Brace: Lumbar corset Restrictions Weight Bearing Restrictions: No      Mobility Bed Mobility Overal bed mobility: Needs Assistance Bed Mobility: Rolling;Sidelying to Sit Rolling: Supervision Sidelying to sit: Supervision       General bed mobility comments: seated EOB upon entry  Transfers Overall transfer level: Needs assistance Equipment used: None Transfers: Sit to/from Stand Sit to Stand: Supervision         General transfer comment: supervision for safety    Balance  Overall balance assessment: Mild deficits observed, not formally tested                                         ADL either performed or assessed with clinical judgement   ADL Overall ADL's : Needs assistance/impaired     Grooming: Supervision/safety;Standing   Upper Body Bathing: Supervision/ safety;Set up;Sitting   Lower Body Bathing: Minimal assistance;Sitting/lateral leans;Cueing for back precautions Lower Body Bathing Details (indicate cue type and reason): patient reports will bathe seated in shower, educated on use of long sponge as unable to complete figure 4 techniques Upper Body Dressing : Supervision/safety;Sitting   Lower Body Dressing: Minimal assistance;Sit to/from stand;Cueing for back precautions;Cueing for compensatory techniques Lower Body Dressing Details (indicate cue type and reason): reviewed compensatory techniques and AE; plans to complete supine as unable to complete figure 4 technique but verbally reviewed use of AE as patient has access to at home  Toilet Transfer: Supervision/safety;Ambulation;Grab bars;BSC;Regular Toilet(3:1 over toilet ) Toilet Transfer Details (indicate cue type and reason): benefits from support with transfer, has grabbar at home and reports has access to 3:1 if needed Toileting- Clothing Manipulation and Hygiene: Supervision/safety;Cueing for compensatory techniques;Sit to/from stand Toileting - Clothing Manipulation Details (indicate cue type and reason): reviewed compensatory techniques and safety for precautions  Tub/ Shower Transfer: Supervision/safety;Ambulation;Shower Scientist, research (medical) Details (indicate cue type and reason): simulated Functional mobility during ADLs: Supervision/safety General ADL Comments: reviewed safety, body mechanics, AE, compensatory techniques and energy conservation     Vision Baseline Vision/History: Wears glasses Wears Glasses: Reading only Patient Visual Report: No change from  baseline Vision Assessment?: No  apparent visual deficits     Perception     Praxis      Pertinent Vitals/Pain Pain Assessment: Faces Faces Pain Scale: Hurts even more Pain Location: back Pain Descriptors / Indicators: Sore;Operative site guarding Pain Intervention(s): Monitored during session;Ice applied     Hand Dominance Right   Extremity/Trunk Assessment Upper Extremity Assessment Upper Extremity Assessment: Overall WFL for tasks assessed   Lower Extremity Assessment Lower Extremity Assessment: Defer to PT evaluation   Cervical / Trunk Assessment Cervical / Trunk Assessment: Other exceptions Cervical / Trunk Exceptions: s/p spinal sx   Communication Communication Communication: No difficulties   Cognition Arousal/Alertness: Awake/alert Behavior During Therapy: WFL for tasks assessed/performed;Anxious Overall Cognitive Status: Within Functional Limits for tasks assessed                                 General Comments: patient very tangential in conversation   General Comments       Exercises     Shoulder Instructions      Home Living Family/patient expects to be discharged to:: Private residence Living Arrangements: Spouse/significant other Available Help at Discharge: Family Type of Home: House Home Access: Stairs to enter [employer] of Steps: 2 Entrance Stairs-Rails: (Grab bar) Home Layout: Two level;Able to live on main level with bedroom/bathroom     Bathroom Shower/Tub: Occupational psychologist: Standard Bathroom Accessibility: Yes   Home Equipment: Environmental consultant - 2 wheels;Cane - single point;Shower seat - built in;Adaptive equipment;Grab bars - toilet;Hand held Architectural technologist: Reacher        Prior Functioning/Environment Level of Independence: Independent with assistive device(s)        Comments: independent with ADLs and mobility        OT Problem List: Decreased activity  tolerance;Impaired balance (sitting and/or standing);Decreased knowledge of use of DME or AE;Decreased knowledge of precautions;Pain      OT Treatment/Interventions:      OT Goals(Current goals can be found in the care plan section) Acute Rehab OT Goals Patient Stated Goal: to get home  OT Goal Formulation: With patient  OT Frequency:     Barriers to D/C:            Co-evaluation              AM-PAC OT "6 Clicks" Daily Activity     Outcome Measure Help from another person eating meals?: None Help from another person taking care of personal grooming?: None Help from another person toileting, which includes using toliet, bedpan, or urinal?: None Help from another person bathing (including washing, rinsing, drying)?: A Little Help from another person to put on and taking off regular upper body clothing?: None Help from another person to put on and taking off regular lower body clothing?: A Little 6 Click Score: 22   End of Session Equipment Utilized During Treatment: Back brace Nurse Communication: Mobility status  Activity Tolerance: Patient tolerated treatment well Patient left: with call bell/phone within reach;Other (comment)(seated EOB)  OT Visit Diagnosis: Unsteadiness on feet (R26.81);Pain Pain - part of body: (back)                Time: 7846-9629 OT Time Calculation (min): 17 min Charges:  OT General Charges $OT Visit: 1 Visit OT Evaluation $OT Eval Low Complexity: [place], OT Acute Rehabilitation Services Pager [phone] Office [phone]   Delight Stare 08/17/2018, 5:35 PM

## 2018-08-17 NOTE — Evaluation (Signed)
Physical Therapy Evaluation Patient Details Name: Melissa Allen MRN: [id number] DOB: 1966/06/14 Today's Date: 08/17/2018   History of Present Illness  52 year old female status post prior L4-5 decompression and fusion surgery, Left L3-L4 anterior lateral retroperitoneal interbody decompression and fusion and L3/L4 pedicle screw fixation  Clinical Impression  Patient seen for mobility assessment s/p spinal surgery. Mobilizing well. Educated patient on precautions, mobility expectations, safety and car transfers. No further acute PT needs. Will sign off.    Follow Up Recommendations No PT follow up;Supervision - Intermittent    Equipment Recommendations  None recommended by PT    Recommendations for Other Services       Precautions / Restrictions Precautions Precautions: Back Precaution Booklet Issued: Yes (comment) Precaution Comments: verbally reviewed with patient Required Braces or Orthoses: Spinal Brace Spinal Brace: Lumbar corset Restrictions Weight Bearing Restrictions: No      Mobility  Bed Mobility Overal bed mobility: Needs Assistance Bed Mobility: Rolling;Sidelying to Sit Rolling: Supervision Sidelying to sit: Supervision       General bed mobility comments: supervision for cues and technique, no physical assist required  Transfers Overall transfer level: Needs assistance Equipment used: None Transfers: Sit to/from Stand Sit to Stand: Supervision         General transfer comment: supervision for safety  Ambulation/Gait Ambulation/Gait assistance: Supervision;Min assist Gait Distance (Feet): 380 Feet Assistive device: 1 person hand held assist Gait Pattern/deviations: Step-through pattern;Decreased stride length;Shuffle;Drifts right/left Gait velocity: decreased Gait velocity interpretation: <1.31 ft/sec, indicative of household ambulator General Gait Details: patient supervision for mobility but ambulates with decreased cadence, HHA provided for  comfort and to set pacing for mobility  Stairs Stairs: Yes Stairs assistance: Min guard Stair Management: One rail Left Number of Stairs: 3 General stair comments: VCs for sequencing, min guard for safety  Wheelchair Mobility    Modified Rankin (Stroke Patients Only)       Balance Overall balance assessment: Mild deficits observed, not formally tested                                           Pertinent Vitals/Pain Pain Assessment: Faces Faces Pain Scale: Hurts even more Pain Location: back Pain Descriptors / Indicators: Sore;Operative site guarding Pain Intervention(s): Monitored during session    Home Living Family/patient expects to be discharged to:: Private residence Living Arrangements: Spouse/significant other Available Help at Discharge: Family Type of Home: House Home Access: Stairs to enter Entrance Stairs-Rails: (Grab bar) Entrance Stairs-Number of Steps: 2 Home Layout: Two level;Able to live on main level with bedroom/bathroom Home Equipment: Gilford Rile - 2 wheels;Cane - single point;Shower seat - built Investment banker, operational      Prior Function Level of Independence: Independent with assistive device(s)         Comments: reports fear of falling and difficulty with curb negotation     Hand Dominance   Dominant Hand: Right    Extremity/Trunk Assessment   Upper Extremity Assessment Upper Extremity Assessment: Overall WFL for tasks assessed    Lower Extremity Assessment Lower Extremity Assessment: Generalized weakness       Communication   Communication: No difficulties  Cognition Arousal/Alertness: Awake/alert Behavior During Therapy: WFL for tasks assessed/performed;Anxious Overall Cognitive Status: Within Functional Limits for tasks assessed  General Comments: patient very tangential in conversation      General Comments      Exercises     Assessment/Plan    PT  Assessment Patent does not need any further PT services  PT Problem List         PT Treatment Interventions      PT Goals (Current goals can be found in the Care Plan section)  Acute Rehab PT Goals PT Goal Formulation: All assessment and education complete, DC therapy    Frequency     Barriers to discharge        Co-evaluation               AM-PAC PT "6 Clicks" Mobility  Outcome Measure Help needed turning from your back to your side while in a flat bed without using bedrails?: None Help needed moving from lying on your back to sitting on the side of a flat bed without using bedrails?: None Help needed moving to and from a bed to a chair (including a wheelchair)?: A Little Help needed standing up from a chair using your arms (e.g., wheelchair or bedside chair)?: A Little Help needed to walk in hospital room?: A Little Help needed climbing 3-5 steps with a railing? : A Little 6 Click Score: 20    End of Session Equipment Utilized During Treatment: Gait belt;Back brace Activity Tolerance: Patient tolerated treatment well Patient left: in bed;with call bell/phone within reach(sitting EOB)   PT Visit Diagnosis: Difficulty in walking, not elsewhere classified (R26.2)    Time: 3825-0539 PT Time Calculation (min) (ACUTE ONLY): 22 min   Charges:   PT Evaluation $PT Eval Low Complexity: [place], PT DPT  Board Certified Neurologic Specialist Acute Rehabilitation Services Pager [phone] Office [phone]   [name] 08/17/2018, 4:50 PM

## 2018-08-17 NOTE — Brief Op Note (Signed)
08/17/2018  9:43 AM  PATIENT:  Melissa Allen  52 y.o. female  PRE-OPERATIVE DIAGNOSIS:  Spondylolisthesis  POST-OPERATIVE DIAGNOSIS:  Spondylolisthesis  PROCEDURE:  Procedure(s): LUMBAR THREE-FOUR ANTERIOR LATERAL INTERBODY FUSION (Left) LUMBAR THREE-FOUR LUMBAR PERCUTANEOUS PEDICLE SCREW (Left)  SURGEON:  Surgeon(s) and Role:    Earnie Larsson, MD - Primary  PHYSICIAN ASSISTANT:   ASSISTANTSMearl Latin   ANESTHESIA:   general  EBL:  10 mL   BLOOD ADMINISTERED:none  DRAINS: none   LOCAL MEDICATIONS USED:  MARCAINE     SPECIMEN:  No Specimen  DISPOSITION OF SPECIMEN:  N/A  COUNTS:  YES  TOURNIQUET:  * No tourniquets in log *  DICTATION: .Dragon Dictation  PLAN OF CARE: Admit to inpatient   PATIENT DISPOSITION:  PACU - hemodynamically stable.   Delay start of Pharmacological VTE agent (>24hrs) due to surgical blood loss or risk of bleeding: yes

## 2018-08-17 NOTE — Op Note (Signed)
Date of procedure: 08/17/2018  Date of dictation: Same  Service: Neurosurgery  Preoperative diagnosis: Grade 1 L3-4 degenerative unstable spondylolisthesis with radiculopathy  Postoperative diagnosis: Same  Procedure Name: Left L3-L4 anterior lateral retroperitoneal interbody decompression and fusion utilizing interbody titanium cage and morselized allograft  L3-4 posterior percutaneous pedicle screw fixation, nonsegmental  Surgeon:Torrance Frech A.Khaniyah Bezek, M.D.  Asst. Surgeon: Reinaldo Meeker, NP  Anesthesia: General  Indication: 52 year old female status post prior L4-5 decompression and fusion surgery presents now with increasing back and bilateral lower extremity symptoms left greater than right.  Work-up demonstrates evidence of an unstable grade 1 L3-4 spondylolisthesis with significant foraminal stenosis and lateral recess stenosis.  Patient is failed conservative management.  She presents now for lumbar decompression and fusion in hopes of improving her symptoms.  Operative note: After induction of anesthesia, patient position in the right lateral decubitus position.  Left flank abdomen and lumbar region prepped and draped sterilely.  Localizing x-rays had been taken and the bed had been flexed and positioned.  Incision made laterally over the L3-4 disc space.  A secondary incision made in her left posterior flank.  Using blunt dissection through the secondary incision access was gained into the retroperitoneal space.  The peritoneal sac and structures were swept and mobilized anteriorly.  The psoas muscle and transverse process were identified.  A dilator was then passed through the lateral abdominal wound docking into the disc space of L3-4.  Fluoroscopy confirmed good position.  Neural monitoring was used and surrounding lumbar plexus branches were free of the dilator.  A K wire was then passed to the L3-4 disc space.  Dilators were progressively enlarged.  Stimulation was done throughout.  The  self-retaining retractor was placed.  This was secured to the bed.  The posterior blade was stimulated and found to be free of any adjacent plexus structures.  Under fluoroscopic guidance the shim was then passed into the L3-4 disc space.  The distractor was widened slightly.  The disc space was directly inspected.  Once again there is no vascular or neural elements within the operative field.  The disc base was directly stimulated again confirming safe passage.  The space then incised.  Discectomy was then performed using various instruments.  Contralateral release was performed using Cobb elevators.  The disc space was sequentially dilated and a 10 mm lordotic implant was found to be most appropriate.  The to space was further prepared here soft tissue was removed.  A 10 mm lordotic titanium printed cage sized 22 mm anterior posterior and 50 mm laterally I was then packed with ostia cell plus and then impacted in place.  Good position was assured with both AP and lateral fluoroscopy.  The retractor system was gradually removed.  No points of bleeding or injury were identified.  The bed was then returned to a neutral position.  Attention then placed posteriorly for pedicle screw fixation.  Entry sites over the L3 and L4 pedicles on the left side were identified.  Stab incisions were made.  A Jamshidi needle introducer was then passed into the pedicles of L3 and L4 under fluoroscopic guidance and with direct neural monitoring.  K wires were then passed into the bodies of L3 and L4.  The Jamshidi introducers were removed.  Each K wire track was then tapped with a screw tapped.  5.5 x 40 mm screw was placed into L3.  A 6.5 x 40 mm screw was passed into L4.  Neuro monitoring confirmed safe passage as did fluoroscopy.  With the towers in place a rod was placed from the cephalad screw tower through the caudal screw tower.  The rod was then reduced into place and secured.  Final tightening was achieved.  The towers were  removed.  Final images reveal good position of the cage and the hardware at the proper operative level with normal alignment of the spine.  Wound was then irrigated with MI solution.  Each wound was then closed in layers with Vicryl sutures.  Steri-Strips and sterile dressing were applied.  No apparent complications.  Patient tolerated the procedure well and she returns to the recovery room postop.

## 2018-08-18 ENCOUNTER — Other Ambulatory Visit: Payer: Self-pay | Admitting: Family Medicine

## 2018-08-18 ENCOUNTER — Encounter (HOSPITAL_COMMUNITY): Payer: Self-pay | Admitting: Neurosurgery

## 2018-08-18 MED ORDER — ZOLPIDEM TARTRATE 10 MG PO TABS: 10.0000 mg | ORAL_TABLET | Freq: Every day | ORAL | 0 refills | Status: DC

## 2018-08-18 NOTE — Discharge Summary (Signed)
Physician Discharge Summary  Patient ID: Melissa Allen MRN: [id number] DOB/AGE: [birth year] 52 y.o.  Admit date: 08/17/2018 Discharge date: 08/18/2018  Admission Diagnoses:  Discharge Diagnoses:  Active Problems:   Degenerative spondylolisthesis   Discharged Condition: good  Hospital Course: Patient admitted to the hospital where she underwent uncomplicated I2-9 anterior lateral decompression and fusion.  Postoperative doing well.  Preoperative back and left lower extremity pain improved.  Standing and walking better.  Ready for discharge home.  Consults:   Significant Diagnostic Studies:   Treatments:   Discharge Exam: Blood pressure (!) 93/59, pulse 90, temperature 97.8 F (36.6 C), temperature source Oral, resp. rate 16, height 5\' 4"  (1.626 m), weight 90.7 kg, last menstrual period 12/21/2011, SpO2 97 %. Awake and alert.  Oriented and appropriate.  Cranial nerve function intact.  Motor and sensory function extremities normal.  Wound clean and dry.  Chest and abdomen benign.  Disposition: Discharge disposition: 01-Home or Self Care        Allergies as of 08/18/2018      Reactions   Latex Itching, Swelling, Dermatitis   SWELLING FROM FACE MASK RING AFTER LAST SURGERY   Lisinopril Cough      Codeine Nausea And Vomiting   Hydrocodone Itching   Tape Rash   TOLERATES PAPER TAPE ONLY NO PINK SURGICAL TAPE EITHER, EKG Leads       Medication List    TAKE these medications   albuterol 108 (90 Base) MCG/ACT inhaler Commonly known as:  PROVENTIL HFA;VENTOLIN HFA INHALE TWO PUFFS EVERY FOUR HOURS AS NEEDED SHORTNESS OF BREATH   ALPRAZolam 0.25 MG tablet Commonly known as:  XANAX Take 1-2 tablets (0.25-0.5 mg total) by mouth daily.   cyclobenzaprine 10 MG tablet Commonly known as:  FLEXERIL Take 10 mg by mouth 2 (two) times daily as needed for muscle spasms (muscle spasms). Take 1 tablet (10 mg) daily at bedtime, may take an additional tablet during the day as needed  for muscle spasms   DULoxetine 60 MG capsule Commonly known as:  CYMBALTA Take 1 capsule (60 mg total) by mouth at bedtime.   fluticasone 50 MCG/ACT nasal spray Commonly known as:  FLONASE USE 2 SPRAYS INTO EACH NOSTRIL ONCE DAILY AS DIRECTED What changed:  See the new instructions.   ibuprofen 200 MG tablet Commonly known as:  ADVIL,MOTRIN You can safely take 3 tablets every 6 hours as needed for pain.  He can alternate this with plain Tylenol.  You can also alternate with the prescribed narcotic.  I would recommend to use the Tylenol and ibuprofen as your first line pain medications. What changed:    how much to take  how to take this  when to take this  reasons to take this   ipratropium-albuterol 0.5-2.5 (3) MG/3ML Soln Commonly known as:  DUONEB Take 3 mLs by nebulization every 4 (four) hours as needed. What changed:  reasons to take this   losartan 50 MG tablet Commonly known as:  COZAAR Take 1 tablet (50 mg total) by mouth daily.   mometasone-formoterol 200-5 MCG/ACT Aero Commonly known as:  DULERA Inhale 2 puffs into the lungs 2 (two) times daily. What changed:  when to take this   oxymorphone 5 MG tablet Commonly known as:  OPANA Take 5 mg by mouth every 4 (four) hours.   PREMARIN 0.3 MG tablet Generic drug:  estrogens (conjugated) TAKE 1 TABLET BY MOUTH DAILY (TAKE FOR 21 DAYS THEN DO NOT TAKE FOR 7DAYS) What changed:  See  the new instructions.   triamterene-hydrochlorothiazide 37.5-25 MG tablet Commonly known as:  MAXZIDE-25 Take 1 tablet by mouth daily.   zolpidem 10 MG tablet Commonly known as:  AMBIEN Take 1 tablet (10 mg total) by mouth at bedtime.            Durable Medical Equipment  (From admission, onward)         Start     Ordered   08/17/18 1137  DME Walker rolling  Once    Question:  Patient needs a walker to treat with the following condition  Answer:  Degenerative spondylolisthesis   08/17/18 1136   08/17/18 1137  DME 3 n 1   Once     08/17/18 1136           Signed: Mallie Mussel A Micharl Helmes 08/18/2018, 10:23 AM

## 2018-08-18 NOTE — Discharge Instructions (Signed)
Wound Care °Keep incision covered and dry for two days.  °Do not put any creams, lotions, or ointments on incision. °Leave steri-strips on back.  They will fall off by themselves. °Activity °Walk each and every day, increasing distance each day. °No lifting greater than 5 lbs.  Avoid excessive neck motion. °No driving for 2 weeks; may ride as a passenger locally. °If provided with back brace, wear when out of bed.  It is not necessary to wear brace in bed. °Diet °Resume your normal diet.  °Return to Work °Will be discussed at you follow up appointment. °Call Your Doctor If Any of These Occur °Redness, drainage, or swelling at the wound.  °Temperature greater than 101 degrees. °Severe pain not relieved by pain medication. °Incision starts to come apart. °Follow Up Appt °Call today for appointment in 1-2 weeks ([phone]) or for problems.  If you have any hardware placed in your spine, you will need an x-ray before your appointment. ° ° °

## 2018-08-18 NOTE — Telephone Encounter (Signed)
TA-Last OV late June/prepared for 30d + 1 refill/thx dmf

## 2018-08-18 NOTE — Progress Notes (Signed)
Patient is discharged from room 3C05 at this time. Alert and in stable condition. IV site d/c'd and instructions read to patient and spouse with understanding verbalized. Left unit via wheelchair with all belongings at side. 

## 2018-08-28 ENCOUNTER — Other Ambulatory Visit: Payer: Self-pay | Admitting: Family Medicine

## 2018-08-28 NOTE — Telephone Encounter (Signed)
OK to fill

## 2018-08-29 NOTE — Telephone Encounter (Signed)
I have refilled to pharmacy requested but she needs to schedule OV for CSC/ UDS update.

## 2018-09-06 ENCOUNTER — Ambulatory Visit (INDEPENDENT_AMBULATORY_CARE_PROVIDER_SITE_OTHER): Payer: BC Managed Care – PPO

## 2018-09-06 ENCOUNTER — Ambulatory Visit: Payer: BC Managed Care – PPO | Admitting: Nurse Practitioner

## 2018-09-06 ENCOUNTER — Encounter: Payer: Self-pay | Admitting: Nurse Practitioner

## 2018-09-06 VITALS — BP 154/89 | HR 115 | Temp 97.7°F | Ht 64.0 in | Wt 198.6 lb

## 2018-09-06 DIAGNOSIS — R05 Cough: Secondary | ICD-10-CM | POA: Diagnosis not present

## 2018-09-06 DIAGNOSIS — R0602 Shortness of breath: Secondary | ICD-10-CM

## 2018-09-06 DIAGNOSIS — J029 Acute pharyngitis, unspecified: Secondary | ICD-10-CM | POA: Diagnosis not present

## 2018-09-06 DIAGNOSIS — J4541 Moderate persistent asthma with (acute) exacerbation: Secondary | ICD-10-CM

## 2018-09-06 DIAGNOSIS — R059 Cough, unspecified: Secondary | ICD-10-CM

## 2018-09-06 NOTE — Progress Notes (Signed)
Subjective:  Patient ID: Melissa Allen, female    DOB: 1966-05-27  Age: 52 y.o. MRN: [id number]  CC: Sore Throat (sore throat mostly on left side-- no tonsils/ little congestion/taken sudafed, advil, muscle relaxers/ 1 week/FYI just had back surgery 08/26/18. )   Sore Throat   This is a new problem. The current episode started yesterday. The problem has been unchanged. The pain is worse on the right side. There has been no fever. Associated symptoms include congestion, coughing and shortness of breath. Pertinent negatives include no hoarse voice, neck pain, stridor or trouble swallowing. She has had no exposure to strep or mono. She has tried acetaminophen (and decongestant) for the symptoms. The treatment provided mild relief.  discharge from hospital 08/18/2018.  Reviewed past Medical, Social and Family history today.  Outpatient Medications Prior to Visit  Medication Sig Dispense Refill  . albuterol (PROAIR HFA) 108 (90 Base) MCG/ACT inhaler INHALE TWO PUFFS EVERY FOUR HOURS AS NEEDED SHORTNESS OF BREATH 8.5 g 2  . ALPRAZolam (XANAX) 0.25 MG tablet TAKE 1 TO 2 TABLETS BY MOUTH DAILY 60 tablet 0  . cyclobenzaprine (FLEXERIL) 10 MG tablet Take 10 mg by mouth 2 (two) times daily as needed for muscle spasms (muscle spasms). Take 1 tablet (10 mg) daily at bedtime, may take an additional tablet during the day as needed for muscle spasms    . DULoxetine (CYMBALTA) 60 MG capsule Take 1 capsule (60 mg total) by mouth at bedtime. 90 capsule 0  . fluticasone (FLONASE) 50 MCG/ACT nasal spray USE 2 SPRAYS INTO EACH NOSTRIL ONCE DAILY AS DIRECTED (Patient taking differently: Place 2 sprays into both nostrils daily. ) 16 g 11  . ibuprofen (ADVIL,MOTRIN) 200 MG tablet You can safely take 3 tablets every 6 hours as needed for pain.  He can alternate this with plain Tylenol.  You can also alternate with the prescribed narcotic.  I would recommend to use the Tylenol and ibuprofen as your first line pain  medications. (Patient taking differently: Take 600 mg by mouth every 6 (six) hours as needed (pain). You can safely take 3 tablets every 6 hours as needed for pain.  He can alternate this with plain Tylenol.  You can also alternate with the prescribed narcotic.  I would recommend to use the Tylenol and ibuprofen as your first line pain medications.)    . ipratropium-albuterol (DUONEB) 0.5-2.5 (3) MG/3ML SOLN Take 3 mLs by nebulization every 4 (four) hours as needed. (Patient taking differently: Take 3 mLs by nebulization every 4 (four) hours as needed (shortness of breath/wheezing). ) 360 mL 1  . losartan (COZAAR) 50 MG tablet Take 1 tablet (50 mg total) by mouth daily. 90 tablet 0  . mometasone-formoterol (DULERA) 200-5 MCG/ACT AERO Inhale 2 puffs into the lungs 2 (two) times daily. (Patient taking differently: Inhale 2 puffs into the lungs daily. ) 1 Inhaler 3  . oxymorphone (OPANA) 5 MG tablet Take 5 mg by mouth every 4 (four) hours.    Marland Kitchen PREMARIN 0.3 MG tablet TAKE 1 TABLET BY MOUTH DAILY (TAKE FOR 21 DAYS THEN DO NOT TAKE FOR 7DAYS) (Patient taking differently: Take 0.3 mg by mouth daily. ) 90 tablet 3  . triamterene-hydrochlorothiazide (MAXZIDE-25) 37.5-25 MG tablet Take 1 tablet by mouth daily. 90 tablet 0  . zolpidem (AMBIEN) 10 MG tablet TAKE 1 TABLET BY MOUTH AT BEDTIME 30 tablet 1  . zolpidem (AMBIEN) 10 MG tablet Take 1 tablet (10 mg total) by mouth at bedtime. 15  tablet 0   No facility-administered medications prior to visit.     ROS See HPI  Objective:  BP (!) 154/89   Pulse (!) 115   Temp 97.7 F (36.5 C) (Oral)   Ht 5\' 4"  (1.626 m)   Wt 198 lb 9.6 oz (90.1 kg)   LMP 12/21/2011   SpO2 99%   BMI 34.09 kg/m   BP Readings from Last 3 Encounters:  09/06/18 (!) 154/89  08/18/18 (!) 93/59  08/10/18 (!) 142/74    Wt Readings from Last 3 Encounters:  09/06/18 198 lb 9.6 oz (90.1 kg)  08/17/18 200 lb (90.7 kg)  08/10/18 206 lb (93.4 kg)    Physical Exam Vitals signs  reviewed.  HENT:     Right Ear: Tympanic membrane and ear canal normal.     Left Ear: Tympanic membrane and ear canal normal.     Mouth/Throat:     Mouth: Mucous membranes are moist.     Pharynx: No oropharyngeal exudate, posterior oropharyngeal erythema or uvula swelling.     Tonsils: No tonsillar exudate. Swelling: 0 on the right. 0 on the left.  Neck:     Musculoskeletal: Normal range of motion and neck supple.  Cardiovascular:     Rate and Rhythm: Normal rate and regular rhythm.  Pulmonary:     Effort: Pulmonary effort is normal.     Breath sounds: Normal breath sounds.  Chest:     Chest wall: No tenderness.  Lymphadenopathy:     Cervical: No cervical adenopathy.  Neurological:     General: No focal deficit present.     Mental Status: She is alert and oriented to person, place, and time.     Lab Results  Component Value Date   WBC 7.2 08/10/2018   HGB 14.2 08/10/2018   HCT 45.4 08/10/2018   PLT 280 08/10/2018   GLUCOSE 103 (H) 08/10/2018   CHOL 244 (H) 08/04/2011   TRIG 159 (H) 08/04/2011   HDL 71 08/04/2011   LDLCALC 141 (H) 08/04/2011   ALT 17 08/05/2017   AST 23 08/05/2017   NA 136 08/10/2018   K 3.3 (L) 08/10/2018   CL 99 08/10/2018   CREATININE 0.66 08/10/2018   BUN 14 08/10/2018   CO2 27 08/10/2018   TSH 1.21 12/31/2015   INR 0.92 09/19/2013    Assessment & Plan:   Melissa Allen was seen today for sore throat.  Diagnoses and all orders for this visit:  Moderate persistent asthma with acute exacerbation  SOB (shortness of breath) -     DG Chest 2 View  Cough -     DG Chest 2 View  Sore throat   I am having Melissa Allen maintain her ipratropium-albuterol, cyclobenzaprine, albuterol, mometasone-formoterol, ibuprofen, fluticasone, PREMARIN, losartan, triamterene-hydrochlorothiazide, DULoxetine, oxymorphone, zolpidem, and ALPRAZolam.  No orders of the defined types were placed in this encounter.   Problem List Items Addressed This Visit       Respiratory   Asthma - Primary    Other Visit Diagnoses    SOB (shortness of breath)       Relevant Orders   DG Chest 2 View (Completed)   Cough       Relevant Orders   DG Chest 2 View (Completed)   Sore throat           Follow-up: No follow-ups on file.  Wilfred Lacy, NP

## 2018-09-06 NOTE — Patient Instructions (Addendum)
Normal CXR. CXR done to rule out CAP. Advised to use chloraseptic lozenges for sore throat and OTC cough medication. Due to recent back surgery, will try to avoid use of systemic oral steroid. Use proair inhaler and dulera as prescribed.

## 2018-09-08 ENCOUNTER — Encounter: Payer: Self-pay | Admitting: Nurse Practitioner

## 2018-10-06 ENCOUNTER — Other Ambulatory Visit: Payer: Self-pay | Admitting: Family Medicine

## 2018-10-13 ENCOUNTER — Other Ambulatory Visit: Payer: Self-pay | Admitting: Family Medicine

## 2018-10-14 NOTE — Telephone Encounter (Signed)
TA-I put in note to pharm to advise pt to sched appt within 60d/plz advise/thx dmf

## 2018-11-08 ENCOUNTER — Other Ambulatory Visit: Payer: Self-pay | Admitting: Family Medicine

## 2018-11-09 ENCOUNTER — Other Ambulatory Visit: Payer: Self-pay | Admitting: Family Medicine

## 2018-11-09 NOTE — Telephone Encounter (Signed)
Pt has scheduled an appt as advised on 2/06 within the 60 days.  Pt appt is 11/30/2018

## 2018-11-17 ENCOUNTER — Other Ambulatory Visit: Payer: Self-pay | Admitting: Nurse Practitioner

## 2018-11-17 ENCOUNTER — Other Ambulatory Visit: Payer: Self-pay | Admitting: Family Medicine

## 2018-11-17 NOTE — Telephone Encounter (Signed)
Need to return to lab for repeat BMP and make appt with pcp

## 2018-11-18 NOTE — Telephone Encounter (Signed)
Pt is scheduled with TA for 3.24.20/thx dmf

## 2018-11-30 ENCOUNTER — Telehealth (INDEPENDENT_AMBULATORY_CARE_PROVIDER_SITE_OTHER): Payer: BC Managed Care – PPO | Admitting: Family Medicine

## 2018-11-30 ENCOUNTER — Other Ambulatory Visit: Payer: Self-pay

## 2018-11-30 ENCOUNTER — Telehealth: Payer: Self-pay

## 2018-11-30 DIAGNOSIS — F411 Generalized anxiety disorder: Secondary | ICD-10-CM

## 2018-11-30 DIAGNOSIS — G4700 Insomnia, unspecified: Secondary | ICD-10-CM

## 2018-11-30 DIAGNOSIS — M129 Arthropathy, unspecified: Secondary | ICD-10-CM

## 2018-11-30 MED ORDER — ESTROGENS CONJUGATED 0.3 MG PO TABS: ORAL_TABLET | ORAL | 3 refills | Status: DC

## 2018-11-30 MED ORDER — TRIAMTERENE-HCTZ 37.5-25 MG PO TABS: 1.0000 | ORAL_TABLET | Freq: Every day | ORAL | 1 refills | Status: DC

## 2018-11-30 MED ORDER — FLUTICASONE PROPIONATE 50 MCG/ACT NA SUSP: NASAL | 11 refills | Status: DC

## 2018-11-30 MED ORDER — ALPRAZOLAM 0.5 MG PO TABS: 0.5000 mg | ORAL_TABLET | Freq: Two times a day (BID) | ORAL | 0 refills | Status: DC | PRN

## 2018-11-30 MED ORDER — DULOXETINE HCL 60 MG PO CPEP: ORAL_CAPSULE | ORAL | 1 refills | Status: DC

## 2018-11-30 MED ORDER — LOSARTAN POTASSIUM 50 MG PO TABS: 50.0000 mg | ORAL_TABLET | Freq: Every day | ORAL | 1 refills | Status: DC

## 2018-11-30 MED ORDER — ZOLPIDEM TARTRATE 10 MG PO TABS: 10.0000 mg | ORAL_TABLET | Freq: Every day | ORAL | 1 refills | Status: DC

## 2018-11-30 NOTE — Telephone Encounter (Signed)
TA-Patient agrees to Tele-Visit/She is wanting to F/U on medications/She does not feel that the current dosage of Alprazolam 0.25mg  1-2qd is very helpful with her level of anxiety that she has right now/Her GAD-is at a 15/She reluctant to increase her Cymbalta as when she was without it once she had bad SE of withdrawals/she feels that if her dosage of alprazolam was slightly increased she may be able to deal with issues a bit better/she refused to do the PHQ-9 as this is a combined issue with her body pains/She is needing refills on Ambien; Cymbalta; Premerin; Losartan; Maxzide; Flonase; Alprazolam with changes/thx dmf

## 2018-11-30 NOTE — Assessment & Plan Note (Signed)
Deteriorated. >25 minutes spent in face to face time with patient, >50% spent in counselling or coordination of care We discussed treatment options with pt.  She denies feeling depressed. Increase xanax to 0.5 mg twice daily as needed for anxiety.  She will use this sparingly. Continue Cymbalta.

## 2018-11-30 NOTE — Progress Notes (Signed)
TELEPHONE ENCOUNTER   Patient verbally agreed to telephone visit and is aware that copayment and coinsurance may apply. Patient was treated using telemedicine according to accepted telemedicine protocols.  Location of the patient: home  Location of provider:  [employer] of all persons participating in the telemedicine service and role in the encounter: Arnette Norris, MD [place]  Subjective:     HPI   Anxiety-  She is wanting to follow up on medications.  She does not feel that the current dosage of Alprazolam 0.25mg  1-2qd is very helpful with her level of anxiety that she has right now. Her GAD-is at a 15.  She reluctant to increase her Cymbalta as when she was without it once she had bad SE of withdrawals. She feels that if her dosage of alprazolam was slightly increased she may be able to deal with issues a bit better.  She refused to do the PHQ-9 as she denies feeling depressed. This is a combined issue with her body pains.  She is needing refills on Ambien; Cymbalta; Premerin; Losartan; Maxzide; Flonase; Alprazolam with changes  GAD 7 : Generalized Anxiety Score 11/30/2018  Nervous, Anxious, on Edge 2  Control/stop worrying 2  Worry too much - different things 2  Trouble relaxing 3  Restless 1  Easily annoyed or irritable 3  Afraid - awful might happen 2  Total GAD 7 Score 15  Anxiety Difficulty Very difficult    Review of Systems  Musculoskeletal: Positive for arthralgias and back pain.  Psychiatric/Behavioral: Positive for sleep disturbance. Negative for agitation, behavioral problems, confusion, decreased concentration, dysphoric mood, hallucinations, self-injury and suicidal ideas. The patient is nervous/anxious. The patient is not hyperactive.   All other systems reviewed and are negative.    Patient Active Problem List   Diagnosis Date Noted  . Acute appendicitis 08/05/2017  . Painful orthopaedic hardware ([place]) 12/22/2016   . Ureteric stone 10/02/2016  . Hot flashes 01/08/2016  . Spinal stenosis, lumbar region, 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  . Degenerative spondylolisthesis 05/01/2015  . Insomnia 09/30/2013  . HEADACHE 04/26/2010  . Fibromyalgia 01/25/2010  . Anxiety state 01/22/2010  . Depression, recurrent ([place]) 01/22/2010  . REFLUX GASTRITIS 01/22/2010  . HEPATIC CYST 01/22/2010  . NEPHROLITHIASIS, HX OF 01/22/2010  . Essential hypertension 01/01/2009  . Palpitations 01/11/2008  . Asthma 11/26/2007  . GERD 11/26/2007  . PANCREATITIS, CHRONIC 11/26/2007  . RENAL CALCULUS 11/26/2007  . Arthropathy 11/26/2007  . Allergic rhinitis 11/24/2007   Social History   Tobacco Use  . Smoking status: Never Smoker  . Smokeless tobacco: Never Used  Substance Use Topics  . Alcohol use: Yes    Alcohol/week: 7.0 standard drinks    Types: 7 Standard drinks or equivalent per week    Comment: Occasional- wine or liquor twice a week    Current Outpatient Medications:  .  albuterol (PROAIR HFA) 108 (90 Base) MCG/ACT inhaler, INHALE TWO PUFFS EVERY FOUR HOURS AS NEEDED SHORTNESS OF BREATH, Disp: 8.5 g, Rfl: 2 .  ALPRAZolam (XANAX) 0.25 MG tablet, TAKE 1 TO 2 TABLETS BY MOUTH DAILY, Disp: 60 tablet, Rfl: 2 .  cyclobenzaprine (FLEXERIL) 10 MG tablet, Take 10 mg by mouth 2 (two) times daily as needed for muscle spasms (muscle spasms). Take 1 tablet (10 mg) daily at bedtime, may take an additional tablet during the day as needed for muscle spasms, Disp: , Rfl:  .  DULoxetine (CYMBALTA) 60 MG capsule, TAKE 1 CAPSULE BY MOUTH ONCE A  DAY AT BEDTIME, Disp: 90 capsule, Rfl: 0 .  fluticasone (FLONASE) 50 MCG/ACT nasal spray, USE 2 SPRAYS INTO EACH NOSTRIL ONCE DAILY AS DIRECTED (Patient taking differently: Place 2 sprays into both nostrils daily. ), Disp: 16 g, Rfl: 11 .  ibuprofen (ADVIL,MOTRIN) 200 MG tablet, You can safely take 3 tablets every 6 hours as needed for pain.  He can alternate this with plain Tylenol.   You can also alternate with the prescribed narcotic.  I would recommend to use the Tylenol and ibuprofen as your first line pain medications. (Patient taking differently: Take 600 mg by mouth every 6 (six) hours as needed (pain). You can safely take 3 tablets every 6 hours as needed for pain.  He can alternate this with plain Tylenol.  You can also alternate with the prescribed narcotic.  I would recommend to use the Tylenol and ibuprofen as your first line pain medications.), Disp: , Rfl:  .  ipratropium-albuterol (DUONEB) 0.5-2.5 (3) MG/3ML SOLN, Take 3 mLs by nebulization every 4 (four) hours as needed. (Patient taking differently: Take 3 mLs by nebulization every 4 (four) hours as needed (shortness of breath/wheezing). ), Disp: 360 mL, Rfl: 1 .  losartan (COZAAR) 50 MG tablet, TAKE 1 TABLET BY MOUTH ONCE A DAY, Disp: 90 tablet, Rfl: 0 .  mometasone-formoterol (DULERA) 200-5 MCG/ACT AERO, Inhale 2 puffs into the lungs 2 (two) times daily. (Patient taking differently: Inhale 2 puffs into the lungs daily. ), Disp: 1 Inhaler, Rfl: 3 .  oxymorphone (OPANA) 5 MG tablet, Take 5 mg by mouth every 4 (four) hours., Disp: , Rfl:  .  PREMARIN 0.3 MG tablet, TAKE 1 TABLET BY MOUTH DAILY (TAKE FOR 21 DAYS THEN DO NOT TAKE FOR 7DAYS) (Patient taking differently: Take 0.3 mg by mouth daily. ), Disp: 90 tablet, Rfl: 3 .  triamterene-hydrochlorothiazide (MAXZIDE-25) 37.5-25 MG tablet, TAKE 1 TABLET BY MOUTH ONCE A DAY, Disp: 90 tablet, Rfl: 0 .  zolpidem (AMBIEN) 10 MG tablet, TAKE 1 TABLET BY MOUTH EVERY NIGHT AT BEDTIME, Disp: 30 tablet, Rfl: 1 Allergies  Allergen Reactions  . Latex Itching, Swelling and Dermatitis    SWELLING FROM FACE MASK RING AFTER LAST SURGERY   . Lisinopril Cough       . Codeine Nausea And Vomiting  . Hydrocodone Itching  . Tape Rash    TOLERATES PAPER TAPE ONLY NO PINK SURGICAL TAPE EITHER, EKG Leads     Assessment & Plan:   1. Anxiety state   2. Arthropathy   3. Insomnia,  unspecified type     No orders of the defined types were placed in this encounter.  No orders of the defined types were placed in this encounter.   Arnette Norris, MD 11/30/2018  Time spent with the patient: 25 minutes, spent in obtaining information about her symptoms, reviewing her previous labs, evaluations, and treatments, counseling her about her condition (please see the discussed topics above), and developing a plan to further investigate it; she had a number of questions which I addressed.   [id number] physician/qualified health professional telephone evaluation 5 to 10 minutes [id number] physician/qualified help functional [place] evaluation for 11 to 20 minutes [id number] physician/qualify he will professional telephone evaluation for 21 to 30 minutes

## 2018-12-29 ENCOUNTER — Other Ambulatory Visit: Payer: Self-pay | Admitting: Family Medicine

## 2018-12-29 DIAGNOSIS — F411 Generalized anxiety disorder: Secondary | ICD-10-CM

## 2018-12-30 NOTE — Telephone Encounter (Signed)
Pt requested Rx refill. Sent to Dr. Aron for review.  

## 2019-01-12 ENCOUNTER — Other Ambulatory Visit: Payer: Self-pay | Admitting: Neurosurgery

## 2019-01-12 DIAGNOSIS — M5412 Radiculopathy, cervical region: Secondary | ICD-10-CM

## 2019-01-21 ENCOUNTER — Ambulatory Visit
Admission: RE | Admit: 2019-01-21 | Discharge: 2019-01-21 | Disposition: A | Discharge status: Home / Self Care | Payer: BC Managed Care – PPO | Source: Ambulatory Visit | Attending: Neurosurgery | Admitting: Neurosurgery

## 2019-01-21 ENCOUNTER — Other Ambulatory Visit: Payer: Self-pay

## 2019-01-21 DIAGNOSIS — M5412 Radiculopathy, cervical region: Secondary | ICD-10-CM

## 2019-02-09 ENCOUNTER — Other Ambulatory Visit: Payer: Self-pay | Admitting: Family Medicine

## 2019-03-08 ENCOUNTER — Other Ambulatory Visit: Payer: Self-pay | Admitting: Neurosurgery

## 2019-03-08 ENCOUNTER — Other Ambulatory Visit: Payer: Self-pay | Admitting: Family Medicine

## 2019-03-08 DIAGNOSIS — M5412 Radiculopathy, cervical region: Secondary | ICD-10-CM

## 2019-03-08 DIAGNOSIS — F411 Generalized anxiety disorder: Secondary | ICD-10-CM

## 2019-03-24 ENCOUNTER — Other Ambulatory Visit: Payer: Self-pay

## 2019-03-24 ENCOUNTER — Ambulatory Visit
Admission: RE | Admit: 2019-03-24 | Discharge: 2019-03-24 | Disposition: A | Discharge status: Home / Self Care | Payer: BC Managed Care – PPO | Source: Ambulatory Visit | Attending: Neurosurgery | Admitting: Neurosurgery

## 2019-03-24 DIAGNOSIS — M5412 Radiculopathy, cervical region: Secondary | ICD-10-CM

## 2019-04-11 ENCOUNTER — Other Ambulatory Visit: Payer: Self-pay | Admitting: Family Medicine

## 2019-04-11 DIAGNOSIS — F411 Generalized anxiety disorder: Secondary | ICD-10-CM

## 2019-05-10 ENCOUNTER — Other Ambulatory Visit: Payer: Self-pay | Admitting: Family Medicine

## 2019-05-18 ENCOUNTER — Other Ambulatory Visit: Payer: Self-pay | Admitting: Family Medicine

## 2019-06-08 ENCOUNTER — Other Ambulatory Visit: Payer: Self-pay | Admitting: Family Medicine

## 2019-06-08 DIAGNOSIS — F411 Generalized anxiety disorder: Secondary | ICD-10-CM

## 2019-06-28 ENCOUNTER — Ambulatory Visit: Payer: BC Managed Care – PPO | Admitting: Family Medicine

## 2019-06-28 ENCOUNTER — Encounter: Payer: Self-pay | Admitting: Family Medicine

## 2019-06-28 ENCOUNTER — Other Ambulatory Visit: Payer: Self-pay

## 2019-06-28 VITALS — BP 106/80 | HR 120 | Temp 98.6°F | Wt 193.6 lb

## 2019-06-28 DIAGNOSIS — H66004 Acute suppurative otitis media without spontaneous rupture of ear drum, recurrent, right ear: Secondary | ICD-10-CM

## 2019-06-28 MED ORDER — AMOXICILLIN-POT CLAVULANATE 875-125 MG PO TABS: 1.0000 | ORAL_TABLET | Freq: Two times a day (BID) | ORAL | 0 refills | Status: AC

## 2019-06-28 NOTE — Patient Instructions (Signed)
Great to see you. You definitely have a ear infection and a probable sinus infection.  Take Augmentin as directed.  Call me in a few days if we need to go down the prednisone route.

## 2019-06-28 NOTE — Progress Notes (Signed)
Subjective:   Patient ID: Melissa Allen, female    DOB: [birth year], 53 y.o.   MRN: [id number]  Melissa Allen is a pleasant 53 y.o. year old female who presents to clinic today with Otalgia (Pt is here today C/O right ear pain.  She can hear fluid in her ear. Sx started 2-weeks-ago.  Tx with Pseudofed and Benadryl which helped minimally but not much.  Last week the right eye was swollen. States her teeth hurt and her throat is sore on the right. She is very fatigued and just wants to go to bed.)  on 06/28/2019  HPI:  URI symptoms- started two weeks ago with right ear pain and has progressed.  Now her teeth hurt and she is fatigued.  No fever, no cough.  Loss loss of sense of smell or taste.  Benadryl D has only helped a little.  Current Outpatient Medications on File Prior to Visit  Medication Sig Dispense Refill  . albuterol (PROAIR HFA) 108 (90 Base) MCG/ACT inhaler INHALE TWO PUFFS EVERY FOUR HOURS AS NEEDED SHORTNESS OF BREATH 8.5 g 2  . ALPRAZolam (XANAX) 0.5 MG tablet TAKE 1 TABLET BY MOUTH TWICE A DAY AS NEEDED FOR ANXIETY 60 tablet 0  . cyclobenzaprine (FLEXERIL) 10 MG tablet Take 10 mg by mouth 2 (two) times daily as needed for muscle spasms (muscle spasms). Take 1 tablet (10 mg) daily at bedtime, may take an additional tablet during the day as needed for muscle spasms    . DULoxetine (CYMBALTA) 60 MG capsule TAKE 1 CAPSULE BY MOUTH ONCE DAILY AT BEDTIME 90 capsule 1  . estrogens, conjugated, (PREMARIN) 0.3 MG tablet TAKE 1 TABLET BY MOUTH DAILY (TAKE FOR 21 DAYS THEN DO NOT TAKE FOR 7DAYS) 90 tablet 3  . fluticasone (FLONASE) 50 MCG/ACT nasal spray USE 2 SPRAYS INTO EACH NOSTRIL ONCE DAILY AS DIRECTED 16 g 11  . ibuprofen (ADVIL,MOTRIN) 200 MG tablet You can safely take 3 tablets every 6 hours as needed for pain.  He can alternate this with plain Tylenol.  You can also alternate with the prescribed narcotic.  I would recommend to use the Tylenol and ibuprofen as your first line pain  medications. (Patient taking differently: Take 600 mg by mouth every 6 (six) hours as needed (pain). You can safely take 3 tablets every 6 hours as needed for pain.  He can alternate this with plain Tylenol.  You can also alternate with the prescribed narcotic.  I would recommend to use the Tylenol and ibuprofen as your first line pain medications.)    . ipratropium-albuterol (DUONEB) 0.5-2.5 (3) MG/3ML SOLN Take 3 mLs by nebulization every 4 (four) hours as needed. (Patient taking differently: Take 3 mLs by nebulization every 4 (four) hours as needed (shortness of breath/wheezing). ) 360 mL 1  . losartan (COZAAR) 50 MG tablet TAKE 1 TABLET BY MOUTH ONCE DAILY 90 tablet 1  . mometasone-formoterol (DULERA) 200-5 MCG/ACT AERO Inhale 2 puffs into the lungs 2 (two) times daily. (Patient taking differently: Inhale 2 puffs into the lungs daily. ) 1 Inhaler 3  . oxymorphone (OPANA) 5 MG tablet Take 5 mg by mouth every 4 (four) hours.    . triamterene-hydrochlorothiazide (MAXZIDE-25) 37.5-25 MG tablet TAKE 1 TABLET BY MOUTH ONCE DAILY 90 tablet 1  . zolpidem (AMBIEN) 10 MG tablet TAKE 1 TABLET BY MOUTH AT BEDTIME 30 tablet 0   No current facility-administered medications on file prior to visit.     Allergies  Allergen  Reactions  . Latex Itching, Swelling and Dermatitis    SWELLING FROM FACE MASK RING AFTER LAST SURGERY   . Lisinopril Cough       . Codeine Nausea And Vomiting  . Hydrocodone Itching  . Tape Rash    TOLERATES PAPER TAPE ONLY NO PINK SURGICAL TAPE EITHER, EKG Leads     Past Medical History:  Diagnosis Date  . Allergy   . Anginal pain ([place])    one episode 4-5 years ago-[facility] [place]-nonspecific-no problems since-  . Anxiety   . Arthritis    multiple areas of arthritis- DDD  . Asthma    recent admit to ER 01/11/12 for exac of asthma  . Calcaneal fracture   . Duplicated ureter, right   . Fibromyalgia   . Hypertension   . Kidney stones   . Obesity (BMI 30.0-34.9)    . Pancreatitis 1986  . PONV (postoperative nausea and vomiting)   . Poor venous access    hard to start iv-  . 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 Shortness of breath dyspnea   . Wears glasses     Past Surgical History:  Procedure Laterality Date  . ABDOMINAL HYSTERECTOMY    . ANTERIOR LAT LUMBAR FUSION Left 08/17/2018   Procedure: LUMBAR THREE-FOUR ANTERIOR LATERAL INTERBODY FUSION;  Surgeon: Earnie Larsson, MD;  Location: [facility];  Service: Neurosurgery;  Laterality: Left;  . APPENDECTOMY  08/05/2017  . BLADDER SUSPENSION  03/01/2012   Procedure: TRANSVAGINAL TAPE (TVT) PROCEDURE;  Surgeon: Emily Filbert, MD;  Location: [facility];  Service: Gynecology;  Laterality: N/A;  . BUNIONECTOMY  1995   Right  . CHOLECYSTECTOMY  late 1980's      . CYSTOSCOPY  03/01/2012   Procedure: CYSTOSCOPY;  Surgeon: Emily Filbert, MD;  Location: [facility];  Service: Gynecology;  Laterality: N/A;  . CYSTOSCOPY W/ URETERAL STENT PLACEMENT Left 12/15/2014   Procedure: CYSTOSCOPY, LEFT URETEROSCOPYU WITH STONE EXTRACTION;  Surgeon: Irine Seal, MD;  Location: [facility];  Service: Urology;  Laterality: Left;  . CYSTOSCOPY W/ URETERAL STENT PLACEMENT Left 10/02/2016   Procedure: CYSTOSCOPY WITH RETROGRADE PYELOGRAM/URETERAL STENT PLACEMENT ureteroscopy, basket extraction;  Surgeon: Cleon Gustin, MD;  Location: [facility];  Service: Urology;  Laterality: Left;  . HARDWARE REMOVAL N/A 12/22/2016   Procedure: Lumbar Four-Five Removal of Hardware;  Surgeon: Earnie Larsson, MD;  Location: [facility];  Service: Neurosurgery;  Laterality: N/A;  . INCISION AND DRAINAGE ABSCESS Left 07/19/2013   Procedure: INCISION AND DRAINAGE LEFT LOWER EXTERMITY HEMATOMA;  Surgeon: Imogene Burn. Georgette [place], MD;  Location: [facility];  Service: General;  Laterality: Left;  Excision left lower leg mass  . KNEE CARTILAGE SURGERY     left  . KNEE SURGERY Left    x 2, Murphy/Sue, corrected  Patella displacement   . LAMINECTOMY    . LAPAROSCOPIC APPENDECTOMY N/A 08/05/2017   Procedure: APPENDECTOMY LAPAROSCOPIC;  Surgeon: Kinsinger, Arta Bruce, MD;  Location: [facility];  Service: General;  Laterality: N/A;  . LUMBAR LAMINECTOMY  10/06   L4-5, Ray  . LUMBAR PERCUTANEOUS PEDICLE SCREW 1 LEVEL Left 08/17/2018   Procedure: LUMBAR THREE-FOUR LUMBAR PERCUTANEOUS PEDICLE SCREW;  Surgeon: Earnie Larsson, MD;  Location: [facility];  Service: Neurosurgery;  Laterality: Left;  . R compressed pronator Right    Sypher, R arm  . RESECTION DISTAL CLAVICAL Right 06/30/2017   Procedure: RIGHT RESECTION DISTAL  CLAVICAL;  Surgeon: Melrose Nakayama, MD;  Location: [facility];  Service: Orthopedics;  Laterality: Right;  . SHOULDER ACROMIOPLASTY Right 06/30/2017   Procedure: RIGHT SHOULDER ACROMIOPLASTY;  Surgeon: Melrose Nakayama, MD;  Location: [facility];  Service: Orthopedics;  Laterality: Right;  . SHOULDER ARTHROSCOPY Right 06/30/2017   Procedure: ARTHROSCOPY DEBRIDEMENT RIGHT SHOULDER;  Surgeon: Melrose Nakayama, MD;  Location: [facility];  Service: Orthopedics;  Laterality: Right;  . SPINAL FUSION    . TENDON REPAIR  2013  . TENDON REPAIR Right 02/01/12  . TONSILLECTOMY  age 11    Family History  Problem Relation Age of Onset  . Cancer Mother        pancreatic cancer  . Heart disease Father 74  . Colon polyps Father   . Colon cancer Paternal Grandfather   . Colon cancer Maternal Grandmother   . Uterine cancer Unknown        Grandmother  . Breast cancer Unknown        Grandmother  . Ovarian cancer Unknown        Grandmother  . Diabetes Unknown        mother  . Other Neg Hx     Social History   Socioeconomic History  . Marital status: Married    Spouse name: Not on file  . Number of children: 3  . Years of education: Not on file  . Highest education level: Not on file  Occupational History  . Occupation: Product manager: [employer]    Comment: [employer] (6th grade)  Social  Needs  . Financial resource strain: Not on file  . Food insecurity    Worry: Not on file    Inability: Not on file  . Transportation needs    Medical: Not on file    Non-medical: Not on file  Tobacco Use  . Smoking status: Never Smoker  . Smokeless tobacco: Never Used  Substance and Sexual Activity  . Alcohol use: Yes    Alcohol/week: 7.0 standard drinks    Types: 7 Standard drinks or equivalent per week    Comment: Occasional- wine or liquor twice a week  . Drug use: No  . Sexual activity: Yes    Birth control/protection: Surgical  Lifestyle  . Physical activity    Days per week: Not on file    Minutes per session: Not on file  . Stress: Not on file  Relationships  . Social Herbalist on phone: Not on file    Gets together: Not on file    Attends religious service: Not on file    Active member of club or organization: Not on file    Attends meetings of clubs or organizations: Not on file    Relationship status: Not on file  . Intimate partner violence    Fear of current or ex partner: Not on file    Emotionally abused: Not on file    Physically abused: Not on file    Forced sexual activity: Not on file  Other Topics Concern  . 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   Review of Systems  Constitutional:  Positive for fatigue. Negative for fever.  HENT: Positive for congestion, ear pain, sinus pressure and sinus pain. Negative for dental problem, drooling, facial swelling, hearing loss, mouth sores, nosebleeds, postnasal drip, rhinorrhea, sneezing, sore throat, tinnitus and voice change.   Eyes: Positive for discharge.  Respiratory: Negative.   Cardiovascular:  Negative.   Gastrointestinal: Negative.   Endocrine: Negative.   Genitourinary: Positive for difficulty urinating.  Musculoskeletal: Negative.   Skin: Negative.   Allergic/Immunologic: Negative.   Hematological: Negative.   Psychiatric/Behavioral: Negative.   All other systems reviewed and are negative.      Objective:    BP 106/80 (BP Location: Left Arm, Patient Position: Sitting, Cuff Size: Normal)   Pulse (!) 120   Temp 98.6 F (37 C) (Oral)   Wt 193 lb 9.6 oz (87.8 kg)   LMP 12/21/2011   SpO2 97%   BMI 33.23 kg/m    Physical Exam Vitals signs and nursing note reviewed.  Constitutional:      General: She is not in acute distress.    Appearance: Normal appearance. She is not ill-appearing or toxic-appearing.  HENT:     Head: Normocephalic and atraumatic.     Right Ear: A middle ear effusion is present. Tympanic membrane is erythematous and bulging.     Left Ear: Tympanic membrane is injected.     Nose: Congestion present.     Right Sinus: Maxillary sinus tenderness and frontal sinus tenderness present.     Left Sinus: Maxillary sinus tenderness present.  Pulmonary:     Effort: Pulmonary effort is normal. No respiratory distress.     Breath sounds: Normal breath sounds. No stridor. No wheezing, rhonchi or rales.  Chest:     Chest wall: No tenderness.  Abdominal:     General: Abdomen is flat.  Musculoskeletal: Normal range of motion.        General: No swelling or tenderness.  Skin:    General: Skin is warm and dry.  Neurological:     General: No focal deficit present.     Mental Status: She is alert and oriented to person, place, and time. Mental status is at baseline.     Cranial Nerves: No cranial nerve deficit.     Motor: No weakness.  Psychiatric:        Mood and Affect: Mood normal.        Behavior: Behavior normal.        Thought Content: Thought content normal.        Judgment: Judgment normal.           Assessment & Plan:   No diagnosis  found. No follow-ups on file.

## 2019-06-28 NOTE — Assessment & Plan Note (Signed)
With sinus infection.  Given duration and progression of symptoms, will treat for bacterial sinusitis and otitis.  She declines steroids.  Augmentin eRx sent to pharmacy on file. Call or send my chart message prn if these symptoms worsen or fail to improve as anticipated. The patient indicates understanding of these issues and agrees with the plan.

## 2019-07-07 ENCOUNTER — Other Ambulatory Visit: Payer: Self-pay | Admitting: Family Medicine

## 2019-07-07 DIAGNOSIS — F411 Generalized anxiety disorder: Secondary | ICD-10-CM

## 2019-07-08 MED ORDER — ALPRAZOLAM 0.5 MG PO TABS: ORAL_TABLET | ORAL | 0 refills | Status: DC

## 2019-07-08 NOTE — Progress Notes (Signed)
Travel or Contacts:   Any travel in the past 2 weeks? NO  Have you came in contact with anyone who has Covid?NO  Have you had a positive Covid test? If so, when?NO  Fever >100.6F []   Yes [x]   No []   Unknown  Chills []   Yes [x]   No []   Unknown  Muscle aches (myalgia) []   Yes [x]   No []   Unknown  Runny nose (rhinorrhea) []   Yes [x]   No []   Unknown  Sore throat [x]   Yes [x]   No []   Unknown Cough (new onset or worsening of chronic cough) []   Yes [x]   No []   Unknown  Shortness of breath (dyspnea) []   Yes [x]   No []   Unknown Nausea or vomiting []   Yes [x]   No []   Unknown  Headache []   Yes [x]   No []   Unknown  Abdominal pain  []   Yes [x]   No []   Unknown  Diarrhea (?3 loose/looser than normal stools/24hr period) [x]   Yes [x]   No []   Unknown Other, s:pecify

## 2019-07-08 NOTE — Patient Instructions (Addendum)
Burn Care, Adult A burn is an injury to the skin or the tissues under the skin. There are three types of burns:  First degree. These burns may cause the skin to be red and slightly swollen.  Second degree. These burns are very painful and cause the skin to be very red. The skin may also leak fluid, look shiny, and develop blisters.  Third degree. These burns cause permanent damage. They either turn the skin white or black and make it look charred, dry, and leathery. Taking care of your burn properly can help to prevent pain and infection. It can also help the burn to heal more quickly. What are the risks? Complications from burns include:  Damage to the skin.  Reduced blood flow near the injury.  Dead tissue.  Scarring.  Problems with movement, if the burn happened near a joint or on the hands or feet. Severe burns can lead to problems that affect the whole body, such as:  Fluid loss.  Less blood circulating in the body.  Inability to maintain a normal core body temperature (thermoregulation).  Infection.  Shock.  Problems breathing. How to care for a first-degree burn Right after a burn:  Rinse or soak the burn under cool water until the pain stops. Do not put ice on your burn. This can cause more damage.  Lightly cover the burn with a sterile cloth (dressing). Burn care  Follow instructions from your health care provider about: ? How to clean and take care of the burn. ? When to change and remove the dressing.  Check your burn every day for signs of infection. Check for: ? More redness, swelling, or pain. ? Warmth. ? Pus or a bad smell. Medicine  Take over-the-counter and prescription medicines only as told by your health care provider.  If you were prescribed antibiotic medicine, take or apply it as told by your health care provider. Do not stop using the antibiotic even if your condition improves. General instructions  To prevent infection, do not put  butter, oil, or other home remedies on your burn.  Do not rub your burn, even when you are cleaning it.  Protect your burn from the sun. How to care for a second-degree burn Right after a burn:  Rinse or soak the burn under cool water. Do this for several minutes. Do not put ice on your burn. This can cause more damage.  Lightly cover the burn with a sterile cloth (dressing). Burn care  Raise (elevate) the injured area above the level of your heart while sitting or lying down.  Follow instructions from your health care provider about: ? How to clean and take care of the burn. ? When to change and remove the dressing.  Check your burn every day for signs of infection. Check for: ? More redness, swelling, or pain. ? Warmth. ? Pus or a bad smell. Medicine   Take over-the-counter and prescription medicines only as told by your health care provider.  If you were prescribed antibiotic medicine, take or apply it as told by your health care provider. Do not stop using the antibiotic even if your condition improves. General instructions  To prevent infection: ? Do not put butter, oil, or other home remedies on the burn. ? Do not scratch or pick at the burn. ? Do not break any blisters. ? Do not peel skin.  Do not rub your burn, even when you are cleaning it.  Protect your burn from the sun. How   to care for a third-degree burn Right after a burn:  Lightly cover the burn with gauze.  Seek immediate medical attention. Burn care  Raise (elevate) the injured area above the level of your heart while sitting or lying down.  Drink enough fluid to keep your urine clear or pale yellow.  Rest as told by your health care provider. Do not participate in sports or other physical activities until your health care provider approves.  Follow instructions from your health care provider about: ? How to clean and take care of the burn. ? When to change and remove the dressing.  Check  your burn every day for signs of infection. Check for: ? More redness, swelling, or pain. ? Warmth. ? Pus or a bad smell. Medicine  Take over-the-counter and prescription medicines only as told by your health care provider.  If you were prescribed antibiotic medicine, take or apply it as told by your health care provider. Do not stop using the antibiotic even if your condition improves. General instructions  To prevent infection: ? Do not put butter, oil, or other home remedies on the burn. ? Do not scratch or pick at the burn. ? Do not break any blisters. ? Do not peel skin. ? Do not rub your burn, even when you are cleaning it.  Protect your burn from the sun.  Keep all follow-up visits as told by your health care provider. This is important. Contact a health care provider if:  Your condition does not improve.  Your condition gets worse.  You have a fever.  Your burn changes in appearance or develops black or red spots.  Your burn feels warm to the touch.  Your pain is not controlled with medicine. Get help right away if:  You have redness, swelling, or pain at the site of the burn.  You have fluid, blood, or pus coming from your burn.  You have red streaks near the burn.  You have severe pain. This information is not intended to replace advice given to you by your health care provider. Make sure you discuss any questions you have with your health care provider. Document Released: 08/25/2005 Document Revised: 08/07/2017 Document Reviewed: 02/12/2016 Elsevier Patient Education  2020 [employer] Inc[employer]  

## 2019-07-08 NOTE — Telephone Encounter (Signed)
Last fill 06/09/19 #60/0 Last OV 06/28/19

## 2019-07-11 ENCOUNTER — Encounter: Payer: Self-pay | Admitting: Family Medicine

## 2019-07-11 ENCOUNTER — Other Ambulatory Visit: Payer: Self-pay

## 2019-07-11 ENCOUNTER — Ambulatory Visit (INDEPENDENT_AMBULATORY_CARE_PROVIDER_SITE_OTHER): Payer: BC Managed Care – PPO | Admitting: Family Medicine

## 2019-07-11 VITALS — HR 102 | Temp 99.1°F | Ht 64.0 in | Wt 198.2 lb

## 2019-07-11 DIAGNOSIS — T24102A Burn of first degree of unspecified site of left lower limb, except ankle and foot, initial encounter: Secondary | ICD-10-CM | POA: Diagnosis not present

## 2019-07-11 DIAGNOSIS — F339 Major depressive disorder, recurrent, unspecified: Secondary | ICD-10-CM

## 2019-07-11 DIAGNOSIS — K861 Other chronic pancreatitis: Secondary | ICD-10-CM

## 2019-07-11 DIAGNOSIS — T300 Burn of unspecified body region, unspecified degree: Secondary | ICD-10-CM | POA: Insufficient documentation

## 2019-07-11 DIAGNOSIS — J441 Chronic obstructive pulmonary disease with (acute) exacerbation: Secondary | ICD-10-CM | POA: Diagnosis not present

## 2019-07-11 DIAGNOSIS — Z23 Encounter for immunization: Secondary | ICD-10-CM | POA: Diagnosis not present

## 2019-07-11 DIAGNOSIS — F411 Generalized anxiety disorder: Secondary | ICD-10-CM | POA: Diagnosis not present

## 2019-07-11 DIAGNOSIS — J45901 Unspecified asthma with (acute) exacerbation: Secondary | ICD-10-CM

## 2019-07-11 LAB — COMPREHENSIVE METABOLIC PANEL
ALT: 13 U/L (ref 0–35)
AST: 17 U/L (ref 0–37)
Albumin: 4 g/dL (ref 3.5–5.2)
Alkaline Phosphatase: 96 U/L (ref 39–117)
BUN: 10 mg/dL (ref 6–23)
CO2: 31 mEq/L (ref 19–32)
Calcium: 9.2 mg/dL (ref 8.4–10.5)
Chloride: 98 mEq/L (ref 96–112)
Creatinine, Ser: 0.64 mg/dL (ref 0.40–1.20)
GFR: 96.51 mL/min (ref >60.00)
Glucose, Bld: 91 mg/dL (ref 70–99)
Potassium: 3 mEq/L — ABNORMAL LOW (ref 3.5–5.1)
Sodium: 139 mEq/L (ref 135–145)
Total Bilirubin: 0.4 mg/dL (ref 0.2–1.2)
Total Protein: 6.8 g/dL (ref 6.0–8.3)

## 2019-07-11 LAB — CBC WITH DIFFERENTIAL/PLATELET
Basophils Absolute: 0.1 10*3/uL (ref 0.0–0.1)
Basophils Relative: 0.7 % (ref 0.0–3.0)
Eosinophils Absolute: 0.4 10*3/uL (ref 0.0–0.7)
Eosinophils Relative: 5 % (ref 0.0–5.0)
HCT: 38.8 % (ref 36.0–46.0)
Hemoglobin: 13 g/dL (ref 12.0–15.0)
Lymphocytes Relative: 33.5 % (ref 12.0–46.0)
Lymphs Abs: 2.5 10*3/uL (ref 0.7–4.0)
MCHC: 33.6 g/dL (ref 30.0–36.0)
MCV: 85.4 fl (ref 78.0–100.0)
Monocytes Absolute: 0.4 10*3/uL (ref 0.1–1.0)
Monocytes Relative: 4.8 % (ref 3.0–12.0)
Neutro Abs: 4.2 10*3/uL (ref 1.4–7.7)
Neutrophils Relative %: 56 % (ref 43.0–77.0)
Platelets: 255 10*3/uL (ref 150.0–400.0)
RBC: 4.54 Mil/uL (ref 3.87–5.11)
RDW: 13.4 % (ref 11.5–15.5)
WBC: 7.4 10*3/uL (ref 4.0–10.5)

## 2019-07-11 MED ORDER — ALPRAZOLAM 0.5 MG PO TABS: ORAL_TABLET | ORAL | 0 refills | Status: DC

## 2019-07-11 MED ORDER — ZOLPIDEM TARTRATE 10 MG PO TABS: 10.0000 mg | ORAL_TABLET | Freq: Every day | ORAL | 0 refills | Status: DC

## 2019-07-11 MED ORDER — ALPRAZOLAM 1 MG PO TABS: 1.0000 mg | ORAL_TABLET | Freq: Two times a day (BID) | ORAL | 0 refills | Status: DC | PRN

## 2019-07-11 MED ORDER — SILVER SULFADIAZINE 1 % EX CREA: 1.0000 "application " | TOPICAL_CREAM | Freq: Every day | CUTANEOUS | 0 refills | Status: DC

## 2019-07-11 MED ORDER — MUPIROCIN 2 % EX OINT: TOPICAL_OINTMENT | CUTANEOUS | 0 refills | Status: DC

## 2019-07-11 NOTE — Assessment & Plan Note (Addendum)
>  40 minutes spent in face to face time with patient, >50% spent in counselling or coordination of care discussing what concerns about this burn- the fact that a blister popped and the central necrotic appearance which is likely much deeper than we expect, warrants debridement- surgical referral placed.  Wound culture taken as well.  Will check CBC and CMET today. eRx sent for Bactroban to apply to dressing which she can change once a day.  She did ask for silvadene, which I alos sent a prescription for,  but I did ask her to hold off on taking this until after she is seen by the surgeon as in some circumstances, this can delay wound healing.  Tdap also given to patient today. Call or send my chart message prn if these symptoms worsen or fail to improve as anticipated. The patient indicates understanding of these issues and agrees with the plan.
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
## 2019-07-14 LAB — WOUND CULTURE
MICRO NUMBER:: 1054480
RESULT:: NO GROWTH
SPECIMEN QUALITY:: ADEQUATE

## 2019-07-18 ENCOUNTER — Emergency Department (HOSPITAL_COMMUNITY): Payer: BC Managed Care – PPO

## 2019-07-18 ENCOUNTER — Other Ambulatory Visit: Payer: Self-pay

## 2019-07-18 ENCOUNTER — Encounter (HOSPITAL_COMMUNITY): Payer: Self-pay | Admitting: Emergency Medicine

## 2019-07-18 ENCOUNTER — Inpatient Hospital Stay (HOSPITAL_COMMUNITY)
Admission: EM | Admit: 2019-07-18 | Discharge: 2019-07-24 | DRG: 438 | Disposition: A | Discharge status: Home / Self Care | Payer: BC Managed Care – PPO | Attending: Internal Medicine | Admitting: Internal Medicine

## 2019-07-18 DIAGNOSIS — R000 Tachycardia, unspecified: Secondary | ICD-10-CM | POA: Diagnosis present

## 2019-07-18 DIAGNOSIS — E872 Acidosis: Secondary | ICD-10-CM | POA: Diagnosis present

## 2019-07-18 DIAGNOSIS — Z20828 Contact with and (suspected) exposure to other viral communicable diseases: Secondary | ICD-10-CM | POA: Diagnosis present

## 2019-07-18 DIAGNOSIS — K3580 Unspecified acute appendicitis: Secondary | ICD-10-CM | POA: Diagnosis present

## 2019-07-18 DIAGNOSIS — I10 Essential (primary) hypertension: Secondary | ICD-10-CM | POA: Diagnosis present

## 2019-07-18 DIAGNOSIS — Z7951 Long term (current) use of inhaled steroids: Secondary | ICD-10-CM

## 2019-07-18 DIAGNOSIS — Z803 Family history of malignant neoplasm of breast: Secondary | ICD-10-CM

## 2019-07-18 DIAGNOSIS — E8779 Other fluid overload: Secondary | ICD-10-CM

## 2019-07-18 DIAGNOSIS — Z8249 Family history of ischemic heart disease and other diseases of the circulatory system: Secondary | ICD-10-CM

## 2019-07-18 DIAGNOSIS — K7689 Other specified diseases of liver: Secondary | ICD-10-CM | POA: Diagnosis present

## 2019-07-18 DIAGNOSIS — R0902 Hypoxemia: Secondary | ICD-10-CM

## 2019-07-18 DIAGNOSIS — K859 Acute pancreatitis without necrosis or infection, unspecified: Secondary | ICD-10-CM | POA: Diagnosis not present

## 2019-07-18 DIAGNOSIS — M797 Fibromyalgia: Secondary | ICD-10-CM | POA: Diagnosis present

## 2019-07-18 DIAGNOSIS — Z8371 Family history of colonic polyps: Secondary | ICD-10-CM

## 2019-07-18 DIAGNOSIS — F339 Major depressive disorder, recurrent, unspecified: Secondary | ICD-10-CM | POA: Diagnosis present

## 2019-07-18 DIAGNOSIS — I110 Hypertensive heart disease with heart failure: Secondary | ICD-10-CM | POA: Diagnosis present

## 2019-07-18 DIAGNOSIS — K8500 Idiopathic acute pancreatitis without necrosis or infection: Secondary | ICD-10-CM | POA: Diagnosis not present

## 2019-07-18 DIAGNOSIS — G44209 Tension-type headache, unspecified, not intractable: Secondary | ICD-10-CM | POA: Diagnosis present

## 2019-07-18 DIAGNOSIS — J9601 Acute respiratory failure with hypoxia: Secondary | ICD-10-CM | POA: Diagnosis not present

## 2019-07-18 DIAGNOSIS — K219 Gastro-esophageal reflux disease without esophagitis: Secondary | ICD-10-CM | POA: Diagnosis present

## 2019-07-18 DIAGNOSIS — Z6834 Body mass index (BMI) 34.0-34.9, adult: Secondary | ICD-10-CM

## 2019-07-18 DIAGNOSIS — E8770 Fluid overload, unspecified: Secondary | ICD-10-CM

## 2019-07-18 DIAGNOSIS — F329 Major depressive disorder, single episode, unspecified: Secondary | ICD-10-CM | POA: Diagnosis present

## 2019-07-18 DIAGNOSIS — Z8049 Family history of malignant neoplasm of other genital organs: Secondary | ICD-10-CM

## 2019-07-18 DIAGNOSIS — Z79899 Other long term (current) drug therapy: Secondary | ICD-10-CM

## 2019-07-18 DIAGNOSIS — J9611 Chronic respiratory failure with hypoxia: Secondary | ICD-10-CM | POA: Diagnosis present

## 2019-07-18 DIAGNOSIS — Z981 Arthrodesis status: Secondary | ICD-10-CM

## 2019-07-18 DIAGNOSIS — R6510 Systemic inflammatory response syndrome (SIRS) of non-infectious origin without acute organ dysfunction: Secondary | ICD-10-CM | POA: Diagnosis present

## 2019-07-18 DIAGNOSIS — J45909 Unspecified asthma, uncomplicated: Secondary | ICD-10-CM | POA: Diagnosis present

## 2019-07-18 DIAGNOSIS — Z791 Long term (current) use of non-steroidal anti-inflammatories (NSAID): Secondary | ICD-10-CM

## 2019-07-18 DIAGNOSIS — E860 Dehydration: Secondary | ICD-10-CM | POA: Diagnosis present

## 2019-07-18 DIAGNOSIS — F411 Generalized anxiety disorder: Secondary | ICD-10-CM | POA: Diagnosis present

## 2019-07-18 DIAGNOSIS — E876 Hypokalemia: Secondary | ICD-10-CM | POA: Diagnosis present

## 2019-07-18 DIAGNOSIS — R739 Hyperglycemia, unspecified: Secondary | ICD-10-CM | POA: Diagnosis present

## 2019-07-18 DIAGNOSIS — Z833 Family history of diabetes mellitus: Secondary | ICD-10-CM

## 2019-07-18 DIAGNOSIS — I509 Heart failure, unspecified: Secondary | ICD-10-CM | POA: Diagnosis present

## 2019-07-18 DIAGNOSIS — E669 Obesity, unspecified: Secondary | ICD-10-CM | POA: Diagnosis present

## 2019-07-18 DIAGNOSIS — K567 Ileus, unspecified: Secondary | ICD-10-CM

## 2019-07-18 DIAGNOSIS — G894 Chronic pain syndrome: Secondary | ICD-10-CM | POA: Diagnosis present

## 2019-07-18 DIAGNOSIS — K862 Cyst of pancreas: Secondary | ICD-10-CM | POA: Diagnosis present

## 2019-07-18 DIAGNOSIS — K861 Other chronic pancreatitis: Secondary | ICD-10-CM | POA: Diagnosis present

## 2019-07-18 DIAGNOSIS — J9621 Acute and chronic respiratory failure with hypoxia: Secondary | ICD-10-CM

## 2019-07-18 DIAGNOSIS — R1110 Vomiting, unspecified: Secondary | ICD-10-CM

## 2019-07-18 DIAGNOSIS — Z9049 Acquired absence of other specified parts of digestive tract: Secondary | ICD-10-CM

## 2019-07-18 DIAGNOSIS — R062 Wheezing: Secondary | ICD-10-CM

## 2019-07-18 DIAGNOSIS — Z800 Family history of malignant neoplasm of digestive organs: Secondary | ICD-10-CM

## 2019-07-18 DIAGNOSIS — Z8041 Family history of malignant neoplasm of ovary: Secondary | ICD-10-CM

## 2019-07-18 LAB — COMPREHENSIVE METABOLIC PANEL
ALT: 18 U/L (ref 0–44)
AST: 19 U/L (ref 15–41)
Albumin: 4.1 g/dL (ref 3.5–5.0)
Alkaline Phosphatase: 101 U/L (ref 38–126)
Anion gap: 10 (ref 5–15)
BUN: 9 mg/dL (ref 6–20)
CO2: 26 mmol/L (ref 22–32)
Calcium: 10.6 mg/dL — ABNORMAL HIGH (ref 8.9–10.3)
Chloride: 101 mmol/L (ref 98–111)
Creatinine, Ser: 0.66 mg/dL (ref 0.44–1.00)
GFR calc Af Amer: >60 mL/min (ref >60)
GFR calc non Af Amer: >60 mL/min (ref >60)
Glucose, Bld: 101 mg/dL — ABNORMAL HIGH (ref 70–99)
Potassium: 3.4 mmol/L — ABNORMAL LOW (ref 3.5–5.1)
Sodium: 137 mmol/L (ref 135–145)
Total Bilirubin: 0.5 mg/dL (ref 0.3–1.2)
Total Protein: 7.6 g/dL (ref 6.5–8.1)

## 2019-07-18 LAB — URINALYSIS, ROUTINE W REFLEX MICROSCOPIC
Bilirubin Urine: NEGATIVE
Glucose, UA: NEGATIVE mg/dL
Hgb urine dipstick: NEGATIVE
Ketones, ur: NEGATIVE mg/dL
Nitrite: NEGATIVE
Protein, ur: NEGATIVE mg/dL
Specific Gravity, Urine: 1.011 (ref 1.005–1.030)
pH: 8 (ref 5.0–8.0)

## 2019-07-18 LAB — CBC
HCT: 43.3 % (ref 36.0–46.0)
Hemoglobin: 14 g/dL (ref 12.0–15.0)
MCH: 28.5 pg (ref 26.0–34.0)
MCHC: 32.3 g/dL (ref 30.0–36.0)
MCV: 88.2 fL (ref 80.0–100.0)
Platelets: 280 10*3/uL (ref 150–400)
RBC: 4.91 MIL/uL (ref 3.87–5.11)
RDW: 13.2 % (ref 11.5–15.5)
WBC: 11.8 10*3/uL — ABNORMAL HIGH (ref 4.0–10.5)
nRBC: 0 % (ref 0.0–0.2)

## 2019-07-18 LAB — SARS CORONAVIRUS 2 (TAT 6-24 HRS): SARS Coronavirus 2: NEGATIVE

## 2019-07-18 LAB — PROTIME-INR
INR: 1 (ref 0.8–1.2)
Prothrombin Time: 12.7 seconds (ref 11.4–15.2)

## 2019-07-18 LAB — LIPASE, BLOOD: Lipase: 88 U/L — ABNORMAL HIGH (ref 11–51)

## 2019-07-18 MED ORDER — DEXTROSE-NACL 5-0.9 % IV SOLN
INTRAVENOUS | Status: DC
  Administered 2019-07-18 – 2019-07-20 (×6): via INTRAVENOUS

## 2019-07-18 MED ORDER — HYDROMORPHONE HCL 2 MG/ML IJ SOLN: 1.0000 mg | INTRAMUSCULAR | Status: DC | PRN

## 2019-07-18 MED ORDER — SENNOSIDES-DOCUSATE SODIUM 8.6-50 MG PO TABS: 1.0000 | ORAL_TABLET | Freq: Every evening | ORAL | Status: DC | PRN

## 2019-07-18 MED ORDER — ONDANSETRON HCL 4 MG PO TABS
4.0000 mg | ORAL_TABLET | Freq: Four times a day (QID) | ORAL | Status: DC | PRN
  Administered 2019-07-21 – 2019-07-24 (×2): 4 mg via ORAL
  Filled 2019-07-18 (×4): qty 1

## 2019-07-18 MED ORDER — MUPIROCIN 2 % EX OINT
TOPICAL_OINTMENT | Freq: Every day | CUTANEOUS | Status: DC
  Administered 2019-07-18 – 2019-07-23 (×6): via TOPICAL
  Filled 2019-07-18 (×2): qty 22

## 2019-07-18 MED ORDER — LACTATED RINGERS IV BOLUS
2000.0000 mL | Freq: Once | INTRAVENOUS | Status: AC
  Administered 2019-07-18: 2000 mL via INTRAVENOUS

## 2019-07-18 MED ORDER — SILVER SULFADIAZINE 1 % EX CREA
1.0000 "application " | TOPICAL_CREAM | Freq: Every day | CUTANEOUS | Status: DC
  Administered 2019-07-19 – 2019-07-23 (×5): 1 via TOPICAL
  Filled 2019-07-18 (×2): qty 50

## 2019-07-18 MED ORDER — LORAZEPAM 2 MG/ML IJ SOLN
0.5000 mg | Freq: Two times a day (BID) | INTRAMUSCULAR | Status: DC | PRN
  Administered 2019-07-18 – 2019-07-22 (×7): 0.5 mg via INTRAVENOUS
  Filled 2019-07-18 (×7): qty 1

## 2019-07-18 MED ORDER — HYDRALAZINE HCL 20 MG/ML IJ SOLN: 10.0000 mg | Freq: Four times a day (QID) | INTRAMUSCULAR | Status: DC | PRN

## 2019-07-18 MED ORDER — METOPROLOL TARTRATE 5 MG/5ML IV SOLN: 2.5000 mg | Freq: Three times a day (TID) | INTRAVENOUS | Status: DC | PRN

## 2019-07-18 MED ORDER — PANTOPRAZOLE SODIUM 40 MG IV SOLR
40.0000 mg | Freq: Two times a day (BID) | INTRAVENOUS | Status: DC
  Administered 2019-07-18 – 2019-07-19 (×2): 40 mg via INTRAVENOUS
  Filled 2019-07-18 (×2): qty 40

## 2019-07-18 MED ORDER — DIPHENHYDRAMINE HCL 50 MG/ML IJ SOLN
12.5000 mg | Freq: Four times a day (QID) | INTRAMUSCULAR | Status: DC | PRN
  Administered 2019-07-19 – 2019-07-22 (×10): 12.5 mg via INTRAVENOUS
  Filled 2019-07-18 (×12): qty 1

## 2019-07-18 MED ORDER — HYDROMORPHONE HCL 1 MG/ML IJ SOLN
1.0000 mg | INTRAMUSCULAR | Status: DC | PRN
  Administered 2019-07-18 – 2019-07-22 (×20): 2 mg via INTRAVENOUS
  Filled 2019-07-18 (×14): qty 2
  Filled 2019-07-18: qty 1
  Filled 2019-07-18 (×9): qty 2

## 2019-07-18 MED ORDER — FAMOTIDINE IN NACL 20-0.9 MG/50ML-% IV SOLN
20.0000 mg | Freq: Once | INTRAVENOUS | Status: AC
  Administered 2019-07-18: 20 mg via INTRAVENOUS
  Filled 2019-07-18: qty 50

## 2019-07-18 MED ORDER — ALBUTEROL SULFATE HFA 108 (90 BASE) MCG/ACT IN AERS: 2.0000 | INHALATION_SPRAY | RESPIRATORY_TRACT | Status: DC | PRN

## 2019-07-18 MED ORDER — POTASSIUM CHLORIDE 10 MEQ/100ML IV SOLN
10.0000 meq | INTRAVENOUS | Status: AC
  Administered 2019-07-18 (×3): 10 meq via INTRAVENOUS
  Filled 2019-07-18 (×3): qty 100

## 2019-07-18 MED ORDER — ACETAMINOPHEN 650 MG RE SUPP: 650.0000 mg | Freq: Four times a day (QID) | RECTAL | Status: DC | PRN

## 2019-07-18 MED ORDER — SODIUM CHLORIDE (PF) 0.9 % IJ SOLN
INTRAMUSCULAR | Status: AC
  Filled 2019-07-18: qty 50

## 2019-07-18 MED ORDER — ACETAMINOPHEN 325 MG PO TABS
650.0000 mg | ORAL_TABLET | Freq: Four times a day (QID) | ORAL | Status: DC | PRN
  Administered 2019-07-19 – 2019-07-24 (×9): 650 mg via ORAL
  Filled 2019-07-18 (×9): qty 2

## 2019-07-18 MED ORDER — MORPHINE SULFATE (PF) 4 MG/ML IV SOLN
8.0000 mg | INTRAVENOUS | Status: DC | PRN
  Administered 2019-07-18 (×2): 8 mg via INTRAVENOUS
  Filled 2019-07-18 (×2): qty 2

## 2019-07-18 MED ORDER — ONDANSETRON HCL 4 MG/2ML IJ SOLN
4.0000 mg | Freq: Four times a day (QID) | INTRAMUSCULAR | Status: DC | PRN
  Administered 2019-07-18 – 2019-07-21 (×8): 4 mg via INTRAVENOUS
  Filled 2019-07-18 (×8): qty 2

## 2019-07-18 MED ORDER — SODIUM CHLORIDE 0.9 % IV BOLUS
1000.0000 mL | Freq: Once | INTRAVENOUS | Status: AC
  Administered 2019-07-18: 1000 mL via INTRAVENOUS

## 2019-07-18 MED ORDER — SODIUM CHLORIDE 0.9% FLUSH
3.0000 mL | Freq: Once | INTRAVENOUS | Status: AC
  Administered 2019-07-18: 3 mL via INTRAVENOUS

## 2019-07-18 MED ORDER — ONDANSETRON HCL 4 MG/2ML IJ SOLN
4.0000 mg | Freq: Once | INTRAMUSCULAR | Status: AC
  Administered 2019-07-18: 4 mg via INTRAVENOUS
  Filled 2019-07-18: qty 2

## 2019-07-18 MED ORDER — ENOXAPARIN SODIUM 40 MG/0.4ML ~~LOC~~ SOLN
40.0000 mg | SUBCUTANEOUS | Status: DC
  Administered 2019-07-18 – 2019-07-23 (×6): 40 mg via SUBCUTANEOUS
  Filled 2019-07-18 (×6): qty 0.4

## 2019-07-18 MED ORDER — KETAMINE HCL 50 MG/5ML IJ SOSY
0.3000 mg/kg | PREFILLED_SYRINGE | Freq: Once | INTRAMUSCULAR | Status: AC
  Administered 2019-07-18: 27 mg via INTRAVENOUS
  Filled 2019-07-18: qty 5

## 2019-07-18 MED ORDER — FLUTICASONE PROPIONATE 50 MCG/ACT NA SUSP
2.0000 | Freq: Every day | NASAL | Status: DC
  Administered 2019-07-19 – 2019-07-24 (×6): 2 via NASAL
  Filled 2019-07-18 (×3): qty 16

## 2019-07-18 MED ORDER — IOHEXOL 300 MG/ML  SOLN
100.0000 mL | Freq: Once | INTRAMUSCULAR | Status: AC | PRN
  Administered 2019-07-18: 100 mL via INTRAVENOUS

## 2019-07-18 MED ORDER — KETOROLAC TROMETHAMINE 15 MG/ML IJ SOLN
15.0000 mg | Freq: Four times a day (QID) | INTRAMUSCULAR | Status: AC | PRN
  Administered 2019-07-18 – 2019-07-23 (×11): 15 mg via INTRAVENOUS
  Filled 2019-07-18 (×11): qty 1

## 2019-07-18 NOTE — H&P (Addendum)
History and Physical  Melissa Allen [id number] DOB: 04-Sep-1966 DOA: 07/18/2019  PCP: Lucille Passy, MD Patient coming from: Home  I have personally briefly reviewed patient's old medical records in [place]   Chief Complaint: Severe abdominal pain  HPI: Melissa Allen is a 53 y.o. female with past medical history significant for hypertension, fibromyalgia, multiple back surgery on pain management, prior history of pancreatitis in 1985, history of cholecystectomy and appendectomy who presents complaining of severe abdominal pain, that is started over the weekend.  She reports,  pain has been progressively getting worse, rated pain 10 out of 10, feels like if somebody punched her abdomen, radiates to her back and shoulder blade.  It is accompanied by nausea no vomiting.  She had a normal bowel movement the day prior to admission.  She relate the pain is similar to prior episode of pancreatitis.  She denies any recent alcohol intake, last drink was a month ago.  Years ago she used to drink. She denies fever, cough, shortness of breath, chest pain.  Evaluation in the ED: Sodium 137, potassium 3.4, glucose 101, BUN 9, creatinine 1.6, calcium 10.6, lipase 88, AST 10, ALT 18, white blood cell 11.8, hemoglobin 14.0, platelet 280.  UA 0-5 white blood cell.  SARS coronavirus 2 pending.  CT abdomen and pelvis:Mild acute pancreatitis. No peripancreatic fluid  collection/pseudocyst.Bilateral ovaries are within normal limits, noting a 2.3 cm left ovarian cyst/follicle.    Review of Systems: All systems reviewed and apart from history of presenting illness, are negative.  Past Medical History:  Diagnosis Date  . Allergy   . Anginal pain ([place])    one episode 4-5 years ago-[facility]-nonspecific-no problems since-  . Anxiety   . Arthritis    multiple areas of arthritis- DDD  . Asthma    recent admit to ER 01/11/12 for exac of asthma  . Calcaneal fracture   . Duplicated  ureter, right   . Fibromyalgia   . Hypertension   . Kidney stones   . Obesity (BMI 30.0-34.9)   . Pancreatitis 1986  . PONV (postoperative nausea and vomiting)   . Poor venous access    hard to start iv-  . 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 Shortness of breath dyspnea   . Wears glasses    Past Surgical History:  Procedure Laterality Date  . ABDOMINAL HYSTERECTOMY    . ANTERIOR LAT LUMBAR FUSION Left 08/17/2018   Procedure: LUMBAR THREE-FOUR ANTERIOR LATERAL INTERBODY FUSION;  Surgeon: Earnie Larsson, MD;  Location: [facility];  Service: Neurosurgery;  Laterality: Left;  . APPENDECTOMY  08/05/2017  . BLADDER SUSPENSION  03/01/2012   Procedure: TRANSVAGINAL TAPE (TVT) PROCEDURE;  Surgeon: Emily Filbert, MD;  Location: [facility];  Service: Gynecology;  Laterality: N/A;  . BUNIONECTOMY  1995   Right  . CHOLECYSTECTOMY  late 1980's      . CYSTOSCOPY  03/01/2012   Procedure: CYSTOSCOPY;  Surgeon: Emily Filbert, MD;  Location: [facility];  Service: Gynecology;  Laterality: N/A;  . CYSTOSCOPY W/ URETERAL STENT PLACEMENT Left 12/15/2014   Procedure: CYSTOSCOPY, LEFT URETEROSCOPYU WITH STONE EXTRACTION;  Surgeon: Irine Seal, MD;  Location: [facility];  Service: Urology;  Laterality: Left;  . CYSTOSCOPY W/ URETERAL STENT PLACEMENT Left 10/02/2016   Procedure: CYSTOSCOPY WITH RETROGRADE PYELOGRAM/URETERAL STENT PLACEMENT ureteroscopy, basket extraction;  Surgeon: Cleon Gustin, MD;  Location: [facility];  Service: Urology;  Laterality: Left;  . HARDWARE REMOVAL N/A 12/22/2016   Procedure: Lumbar Four-Five Removal of Hardware;  Surgeon: Earnie Larsson, MD;  Location: [facility];  Service: Neurosurgery;  Laterality: N/A;  . INCISION AND DRAINAGE ABSCESS Left 07/19/2013   Procedure: INCISION AND DRAINAGE LEFT LOWER EXTERMITY HEMATOMA;  Surgeon: Imogene Burn. Georgette Dover, MD;  Location: [facility];  Service: General;  Laterality: Left;  Excision left lower leg  mass  . KNEE CARTILAGE SURGERY     left  . KNEE SURGERY Left    x 2, Murphy/Sue, corrected Patella displacement   . LAMINECTOMY    . LAPAROSCOPIC APPENDECTOMY N/A 08/05/2017   Procedure: APPENDECTOMY LAPAROSCOPIC;  Surgeon: Kinsinger, Arta Bruce, MD;  Location: [facility];  Service: General;  Laterality: N/A;  . LUMBAR LAMINECTOMY  10/06   L4-5, Ray  . LUMBAR PERCUTANEOUS PEDICLE SCREW 1 LEVEL Left 08/17/2018   Procedure: LUMBAR THREE-FOUR LUMBAR PERCUTANEOUS PEDICLE SCREW;  Surgeon: Earnie Larsson, MD;  Location: [facility];  Service: Neurosurgery;  Laterality: Left;  . R compressed pronator Right    Sypher, R arm  . RESECTION DISTAL CLAVICAL Right 06/30/2017   Procedure: RIGHT RESECTION DISTAL CLAVICAL;  Surgeon: Melrose Nakayama, MD;  Location: [facility];  Service: Orthopedics;  Laterality: Right;  . SHOULDER ACROMIOPLASTY Right 06/30/2017   Procedure: RIGHT SHOULDER ACROMIOPLASTY;  Surgeon: Melrose Nakayama, MD;  Location: [facility];  Service: Orthopedics;  Laterality: Right;  . SHOULDER ARTHROSCOPY Right 06/30/2017   Procedure: ARTHROSCOPY DEBRIDEMENT RIGHT SHOULDER;  Surgeon: Melrose Nakayama, MD;  Location: [facility];  Service: Orthopedics;  Laterality: Right;  . SPINAL FUSION    . TENDON REPAIR  2013  . TENDON REPAIR Right 02/01/12  . TONSILLECTOMY  age 88   Social History:  reports that she has never smoked. She has never used smokeless tobacco. She reports current alcohol use of about 7.0 standard drinks of alcohol per week. She reports that she does not use drugs.   Allergies  Allergen Reactions  . Latex Itching, Swelling and Dermatitis    SWELLING FROM FACE MASK RING AFTER LAST SURGERY   . Lisinopril Cough       . Codeine Nausea And Vomiting  . Hydrocodone Itching  . Tape Rash    TOLERATES PAPER TAPE ONLY NO PINK SURGICAL TAPE EITHER, EKG Leads     Family History  Problem Relation Age of Onset  . Cancer Mother        pancreatic cancer  . Heart disease Father 8  . Colon polyps Father    . Colon cancer Paternal Grandfather   . Colon cancer Maternal Grandmother   . Uterine cancer Other        Grandmother  . Breast cancer Other        Grandmother  . Ovarian cancer Other        Grandmother  . Diabetes Other        mother  . Other Neg Hx    Prior to Admission medications   Medication Sig Start Date End Date Taking? Authorizing Provider  albuterol (PROAIR HFA) 108 (90 Base) MCG/ACT inhaler INHALE TWO PUFFS EVERY FOUR HOURS AS NEEDED SHORTNESS OF BREATH Patient taking differently: Inhale 2 puffs into the lungs every 4 (four) hours as needed for shortness of breath.  08/27/16  Yes Biagio Borg, MD  ALPRAZolam Duanne Moron) 1 MG tablet Take 1 tablet (1 mg total) by mouth 2 (two) times daily as needed for anxiety. 09/10/19 10/11/19  Yes Lucille Passy, MD  cyclobenzaprine (FLEXERIL) 5 MG tablet Take 5 mg by mouth 3 (three) times daily as needed for muscle spasms. 07/07/19  Yes [provider]  DULoxetine (CYMBALTA) 30 MG capsule Take 90 mg by mouth at bedtime. 07/07/19  Yes [provider]  estrogens, conjugated, (PREMARIN) 0.3 MG tablet TAKE 1 TABLET BY MOUTH DAILY (TAKE FOR 21 DAYS THEN DO NOT TAKE FOR 7DAYS) Patient taking differently: Take 0.3 mg by mouth as directed. Take 1 tablet (0.3mg ) every day for 21 days, then do not take medication for 7 days 11/30/18  Yes Lucille Passy, MD  fluticasone (FLONASE) 50 MCG/ACT nasal spray USE 2 SPRAYS INTO EACH NOSTRIL ONCE DAILY AS DIRECTED Patient taking differently: Place 2 sprays into both nostrils daily.  11/30/18  Yes Lucille Passy, MD  ibuprofen (ADVIL,MOTRIN) 200 MG tablet You can safely take 3 tablets every 6 hours as needed for pain.  He can alternate this with plain Tylenol.  You can also alternate with the prescribed narcotic.  I would recommend to use the Tylenol and ibuprofen as your first line pain medications. Patient taking differently: Take 600 mg by mouth every 6 (six) hours as needed (pain). You can safely take 3  tablets every 6 hours as needed for pain.  He can alternate this with plain Tylenol.  You can also alternate with the prescribed narcotic.  I would recommend to use the Tylenol and ibuprofen as your first line pain medications. 08/06/17  Yes Earnstine Regal, PA-C  losartan (COZAAR) 50 MG tablet TAKE 1 TABLET BY MOUTH ONCE DAILY Patient taking differently: Take 50 mg by mouth daily.  05/18/19  Yes Lucille Passy, MD  mupirocin ointment (BACTROBAN) 2 % Apply to wound prior to apply dressing once daily. 07/11/19  Yes Lucille Passy, MD  oxymorphone (OPANA) 5 MG tablet Take 5 mg by mouth every 4 (four) hours.   Yes [provider]  silver sulfADIAZINE (SILVADENE) 1 % cream Apply 1 application topically daily. 07/11/19  Yes Lucille Passy, MD  triamterene-hydrochlorothiazide (MAXZIDE-25) 37.5-25 MG tablet TAKE 1 TABLET BY MOUTH ONCE DAILY Patient taking differently: Take 1 tablet by mouth daily.  05/18/19  Yes Lucille Passy, MD  zolpidem (AMBIEN) 10 MG tablet Take 1 tablet (10 mg total) by mouth at bedtime. 07/11/19  Yes Lucille Passy, MD  ipratropium-albuterol (DUONEB) 0.5-2.5 (3) MG/3ML SOLN Take 3 mLs by nebulization every 4 (four) hours as needed. Patient not taking: Reported on 07/18/2019 01/04/13   Lucille Passy, MD  mometasone-formoterol [facility]) 200-5 MCG/ACT AERO Inhale 2 puffs into the lungs 2 (two) times daily. Patient not taking: Reported on 07/18/2019 08/27/16   Biagio Borg, MD   Physical Exam: Vitals:   07/18/19 1430 07/18/19 1500 07/18/19 1530 07/18/19 1600  BP: 134/78 (!) 148/93 133/74 124/72  Pulse: (!) 111 (!) 111 (!) 107 (!) 101  Resp: 15 (!) 23 13 (!) 9  Temp:      TempSrc:      SpO2: 94% 98% 92% 91%     General exam: Moderately built and nourished patient, lying comfortably supine on the gurney in no obvious distress.  Head, eyes and ENT: Nontraumatic and normocephalic. Pupils equally reacting to light and accommodation. Oral mucosa moist.  Neck: Supple. No JVD, carotid  bruit or thyromegaly.  Lymphatics: No lymphadenopathy.  Respiratory system: Clear to auscultation. No increased work of breathing.  Cardiovascular system: S1 and S2 heard, RRR. No JVD, murmurs,  gallops, clicks or pedal edema.  Gastrointestinal system: Abdomen is nondistended, soft and tender epigastrium and midabdomen. Normal bowel sounds heard. No organomegaly or masses appreciated.  Central nervous system: Alert and oriented. No focal neurological deficits.  Extremities: Symmetric 5 x 5 power. Peripheral pulses symmetrically felt.   Skin: Nikkel scar size with mild erythema  Musculoskeletal system: Negative exam.  Psychiatry: Pleasant and cooperative.   Labs on Admission:  Basic Metabolic Panel: Recent Labs  Lab 07/18/19 1019  NA 137  K 3.4*  CL 101  CO2 26  GLUCOSE 101*  BUN 9  CREATININE 0.66  CALCIUM 10.6*   Liver Function Tests: Recent Labs  Lab 07/18/19 1019  AST 19  ALT 18  ALKPHOS 101  BILITOT 0.5  PROT 7.6  ALBUMIN 4.1   Recent Labs  Lab 07/18/19 1019  LIPASE 88*   No results for input(s): AMMONIA in the last 168 hours. CBC: Recent Labs  Lab 07/18/19 1019  WBC 11.8*  HGB 14.0  HCT 43.3  MCV 88.2  PLT 280   Cardiac Enzymes: No results for input(s): CKTOTAL, CKMB, CKMBINDEX, TROPONINI in the last 168 hours.  BNP (last 3 results) No results for input(s): PROBNP in the last 8760 hours. CBG: No results for input(s): GLUCAP in the last 168 hours.  Radiological Exams on Admission: Ct Abdomen Pelvis W Contrast  Result Date: 07/18/2019 CLINICAL DATA:  Abdominal pain, nausea, history of pancreatitis EXAM: CT ABDOMEN AND PELVIS WITH CONTRAST TECHNIQUE: Multidetector CT imaging of the abdomen and pelvis was performed using the standard protocol following bolus administration of intravenous contrast. CONTRAST:  142mL OMNIPAQUE IOHEXOL 300 MG/ML  SOLN COMPARISON:  08/05/2017 FINDINGS: Lower chest: Lung bases are clear. Hepatobiliary: Lobulated 6.8  x 8.4 cm cyst in segment 8 (series 2/image 16). Additional small left hepatic lobe cysts. Status post cholecystectomy. No intrahepatic or extrahepatic ductal dilatation. Pancreas: Mild peripancreatic inflammatory changes along the pancreatic tail (series 2/image 33), suggesting acute pancreatitis. No peripancreatic fluid collection. Stable 15 mm cystic lesion along the posterior pancreatic head (series 2/image 33). Spleen: Within normal limits. Adrenals/Urinary Tract: Stable 13 mm right adrenal nodule (series 2/image 27), favoring a benign adrenal adenoma. Left adrenal gland is within normal limits. Kidneys are within normal limits.  No hydronephrosis. Bladder is within normal limits. Stomach/Bowel: Stomach is within normal limits. No evidence of bowel obstruction. Prior appendectomy. Vascular/Lymphatic: No evidence of abdominal aortic aneurysm. Atherosclerotic calcifications of the abdominal aorta and branch vessels. No suspicious abdominopelvic lymphadenopathy. Reproductive: Status post hysterectomy. Bilateral ovaries are within normal limits, noting a 2.3 cm left ovarian cyst/follicle. Other: No abdominopelvic ascites. Musculoskeletal: Status post PLIF at L3-4. Interbody fusion with posterior laminectomy at L4-5. Mild degenerative changes of the visualized thoracolumbar spine. IMPRESSION: Mild acute pancreatitis. No peripancreatic fluid collection/pseudocyst. Additional stable ancillary findings as above. Electronically Signed   By: Julian Hy M.D.   On: 07/18/2019 11:47    EKG: Sinus rhythm , borderline T waves abnormalities.   Assessment/Plan Active Problems:   Anxiety state   Depression, recurrent ([place])   Essential hypertension   Asthma   GERD   Acute appendicitis   Pancreatitis   Acute pancreatitis   1-Acute pancreatitis: Idiopathic Patient denies recent alcohol intake, has history of cholecystectomy. Continue with IV fluids 125 cc IV Dilaudid as needed for pain IV Protonix, Zofran  as needed for nausea Bowel rest, n.p.o. status.  2-Hypertension: Hold on Cozaar and Maxide to avoid future dehydration.  Will order as needed hydralazine  3-Hypokalemia: Replete IV 4-GERD: IV Protonix. 5-Depression: Hold Cymbalta while n.p.o. 6-Chronic pain management: on opana at home. Hold while NPO IV dilaudid, PRN baclofen.  7-Asthma; continue with inhalers.   DVT Prophylaxis: Lovenox Code Status: Full code Family Communication: Care discussed with patient Disposition Plan: Admit to the hospital under observation for initial evaluation of pancreatitis.  If patient continues to have severe pain might require inpatient status.  Time spent: 75 minutes  Elmarie Shiley MD [facility]   07/18/2019, 4:44 PM

## 2019-07-18 NOTE — ED Notes (Signed)
Patient transported to CT 
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
## 2019-07-18 NOTE — ED Notes (Signed)
Pt ambulated to/from the restroom with standby assist. No issues observed. Pt reported an increase in her abdominal pain during ambulation. Pt is back in bed and resting at this time. Pt called her husband to update him.

## 2019-07-18 NOTE — ED Triage Notes (Signed)
Patient here from home with complaints of abd pain, nausea that started yesterday. Tums with no relief. Pain "all over" radiating into back.

## 2019-07-18 NOTE — ED Notes (Signed)
Patient given Iced water for PO challenge.

## 2019-07-19 DIAGNOSIS — J9601 Acute respiratory failure with hypoxia: Secondary | ICD-10-CM | POA: Diagnosis not present

## 2019-07-19 DIAGNOSIS — R935 Abnormal findings on diagnostic imaging of other abdominal regions, including retroperitoneum: Secondary | ICD-10-CM | POA: Diagnosis not present

## 2019-07-19 DIAGNOSIS — G894 Chronic pain syndrome: Secondary | ICD-10-CM | POA: Diagnosis present

## 2019-07-19 DIAGNOSIS — K859 Acute pancreatitis without necrosis or infection, unspecified: Secondary | ICD-10-CM | POA: Diagnosis present

## 2019-07-19 DIAGNOSIS — D72829 Elevated white blood cell count, unspecified: Secondary | ICD-10-CM

## 2019-07-19 DIAGNOSIS — J45909 Unspecified asthma, uncomplicated: Secondary | ICD-10-CM | POA: Diagnosis present

## 2019-07-19 DIAGNOSIS — M797 Fibromyalgia: Secondary | ICD-10-CM | POA: Diagnosis present

## 2019-07-19 DIAGNOSIS — I110 Hypertensive heart disease with heart failure: Secondary | ICD-10-CM | POA: Diagnosis present

## 2019-07-19 DIAGNOSIS — K219 Gastro-esophageal reflux disease without esophagitis: Secondary | ICD-10-CM | POA: Diagnosis present

## 2019-07-19 DIAGNOSIS — K3589 Other acute appendicitis without perforation or gangrene: Secondary | ICD-10-CM | POA: Diagnosis not present

## 2019-07-19 DIAGNOSIS — G44209 Tension-type headache, unspecified, not intractable: Secondary | ICD-10-CM | POA: Diagnosis present

## 2019-07-19 DIAGNOSIS — R6510 Systemic inflammatory response syndrome (SIRS) of non-infectious origin without acute organ dysfunction: Secondary | ICD-10-CM | POA: Diagnosis present

## 2019-07-19 DIAGNOSIS — R748 Abnormal levels of other serum enzymes: Secondary | ICD-10-CM

## 2019-07-19 DIAGNOSIS — K7689 Other specified diseases of liver: Secondary | ICD-10-CM | POA: Diagnosis present

## 2019-07-19 DIAGNOSIS — E876 Hypokalemia: Secondary | ICD-10-CM | POA: Diagnosis present

## 2019-07-19 DIAGNOSIS — F329 Major depressive disorder, single episode, unspecified: Secondary | ICD-10-CM | POA: Diagnosis present

## 2019-07-19 DIAGNOSIS — K861 Other chronic pancreatitis: Secondary | ICD-10-CM | POA: Diagnosis present

## 2019-07-19 DIAGNOSIS — R739 Hyperglycemia, unspecified: Secondary | ICD-10-CM

## 2019-07-19 DIAGNOSIS — E872 Acidosis: Secondary | ICD-10-CM | POA: Diagnosis present

## 2019-07-19 DIAGNOSIS — R000 Tachycardia, unspecified: Secondary | ICD-10-CM | POA: Diagnosis present

## 2019-07-19 DIAGNOSIS — I10 Essential (primary) hypertension: Secondary | ICD-10-CM | POA: Diagnosis not present

## 2019-07-19 DIAGNOSIS — E669 Obesity, unspecified: Secondary | ICD-10-CM | POA: Diagnosis present

## 2019-07-19 DIAGNOSIS — R1012 Left upper quadrant pain: Secondary | ICD-10-CM | POA: Diagnosis not present

## 2019-07-19 DIAGNOSIS — I509 Heart failure, unspecified: Secondary | ICD-10-CM | POA: Diagnosis present

## 2019-07-19 DIAGNOSIS — F411 Generalized anxiety disorder: Secondary | ICD-10-CM

## 2019-07-19 DIAGNOSIS — K3580 Unspecified acute appendicitis: Secondary | ICD-10-CM | POA: Diagnosis present

## 2019-07-19 DIAGNOSIS — J9621 Acute and chronic respiratory failure with hypoxia: Secondary | ICD-10-CM | POA: Diagnosis not present

## 2019-07-19 DIAGNOSIS — F339 Major depressive disorder, recurrent, unspecified: Secondary | ICD-10-CM | POA: Diagnosis not present

## 2019-07-19 DIAGNOSIS — E860 Dehydration: Secondary | ICD-10-CM | POA: Diagnosis present

## 2019-07-19 DIAGNOSIS — J9611 Chronic respiratory failure with hypoxia: Secondary | ICD-10-CM | POA: Diagnosis present

## 2019-07-19 DIAGNOSIS — Z20828 Contact with and (suspected) exposure to other viral communicable diseases: Secondary | ICD-10-CM | POA: Diagnosis present

## 2019-07-19 DIAGNOSIS — J4521 Mild intermittent asthma with (acute) exacerbation: Secondary | ICD-10-CM | POA: Diagnosis not present

## 2019-07-19 DIAGNOSIS — K8500 Idiopathic acute pancreatitis without necrosis or infection: Secondary | ICD-10-CM | POA: Diagnosis present

## 2019-07-19 DIAGNOSIS — K862 Cyst of pancreas: Secondary | ICD-10-CM | POA: Diagnosis present

## 2019-07-19 DIAGNOSIS — J4541 Moderate persistent asthma with (acute) exacerbation: Secondary | ICD-10-CM | POA: Diagnosis not present

## 2019-07-19 DIAGNOSIS — R112 Nausea with vomiting, unspecified: Secondary | ICD-10-CM | POA: Diagnosis not present

## 2019-07-19 LAB — CBC
HCT: 45 % (ref 36.0–46.0)
Hemoglobin: 13.6 g/dL (ref 12.0–15.0)
MCH: 28.2 pg (ref 26.0–34.0)
MCHC: 30.2 g/dL (ref 30.0–36.0)
MCV: 93.4 fL (ref 80.0–100.0)
Platelets: 253 10*3/uL (ref 150–400)
RBC: 4.82 MIL/uL (ref 3.87–5.11)
RDW: 13.4 % (ref 11.5–15.5)
WBC: 12.9 10*3/uL — ABNORMAL HIGH (ref 4.0–10.5)
nRBC: 0 % (ref 0.0–0.2)

## 2019-07-19 LAB — COMPREHENSIVE METABOLIC PANEL
ALT: 22 U/L (ref 0–44)
AST: 33 U/L (ref 15–41)
Albumin: 3.8 g/dL (ref 3.5–5.0)
Alkaline Phosphatase: 98 U/L (ref 38–126)
Anion gap: 11 (ref 5–15)
BUN: 7 mg/dL (ref 6–20)
CO2: 21 mmol/L — ABNORMAL LOW (ref 22–32)
Calcium: 8.9 mg/dL (ref 8.9–10.3)
Chloride: 107 mmol/L (ref 98–111)
Creatinine, Ser: 0.86 mg/dL (ref 0.44–1.00)
GFR calc Af Amer: >60 mL/min (ref >60)
GFR calc non Af Amer: >60 mL/min (ref >60)
Glucose, Bld: 131 mg/dL — ABNORMAL HIGH (ref 70–99)
Potassium: 4.4 mmol/L (ref 3.5–5.1)
Sodium: 139 mmol/L (ref 135–145)
Total Bilirubin: 0.7 mg/dL (ref 0.3–1.2)
Total Protein: 7.4 g/dL (ref 6.5–8.1)

## 2019-07-19 LAB — TRIGLYCERIDES: Triglycerides: 180 mg/dL — ABNORMAL HIGH (ref <150)

## 2019-07-19 LAB — HIV ANTIBODY (ROUTINE TESTING W REFLEX): HIV Screen 4th Generation wRfx: NONREACTIVE

## 2019-07-19 LAB — LIPASE, BLOOD: Lipase: 41 U/L (ref 11–51)

## 2019-07-19 MED ORDER — PANTOPRAZOLE SODIUM 40 MG PO TBEC
40.0000 mg | DELAYED_RELEASE_TABLET | Freq: Every day | ORAL | Status: DC
  Administered 2019-07-21 – 2019-07-24 (×4): 40 mg via ORAL
  Filled 2019-07-19 (×4): qty 1

## 2019-07-19 NOTE — Consult Note (Addendum)
[facility] Consult: 1:39 PM 07/19/2019  LOS: 0 days    Referring Provider: Dr Alfredia Ferguson  Primary Care Physician:  Lucille Passy, MD Primary Gastroenterologist:  Dr Delfin Edis.  No interactions with Dr. Maurene Capes since 2011    Reason for Consultation: Recurrent pancreatitis.   HPI: Melissa Allen is a 53 y.o. female.  PMH htn.  Fibromyalgia.  Multiple back surgeries.  Latest spine surgery was cervical spine in late May 2020. Opiate pain mgt.  Anxiety/depression..  Asthma.  Ureteral stones and stents.  Stable hepatic cysts.   History pancreatitis of unknown determined because in 1985. S/p cholecystectomy late 1980s.  S/p Lap appy 2018.   Multiple EGDs and 2 ERCPs dating 2000 - 2011 for evaluation of abdominal pain, GI distress, have been unrevealing. 08/1999 ERCP.  Normal, postcholecystectomy ERCP.  Bile reflux gastritis Latest EGD 02/19/2010. For functional abdominal pain, IBS, bile gastritis in 2000, chest pain.  Study revealed irregular Z-line, biopsied (mild inflammation C/W GERD.  No metaplasia, dysplasia, malignancy..  Mild gastritis, biopsy showed minimal chronic inflammation, no H. pylori, intestinal metaplasia etc).  No findings accounting for her complaints Carafate was added to Prilosec 20 mg/day  Prescribed Augmentin for URI 10/20 - 10/30.  Office encounter with PMD dr Deborra Medina 11/2 for blister in her left thigh.    Has not had much in the way of chronic or acute GI symptoms for many years.  No PPI etc.    Admitted yesterday with severe abdominal pain.  This started on Saturday.  Associated with nausea but no emesis.  Pain located left abdomen radiating to left flank.  Takes chronic narcotics for musculoskeletal pain but this did not help her abdominal pain.  She presented to the [facility] long ED yesterday  morning. Lipase 88 >> 41 .  LFTs normal on 11/2, 11/9, 11/10. WBCs elevated 12.9 CTAP with contrast shows cysts in the left liver, 1 of these is lobulated measuring 6.8 x 8.4 cm.  Biliary tree normal.  Mild inflammation in tail of pancreas suggesting acute pancreatitis.  Stable 15 mm cystic lesion at posterior pancreatic head.  No alcohol for many years though she used to drink alcohol in moderation.   Mother died age 79 with pancreatic cancer. She has just been informed by phone that her 57 something y/o daughter left the hospital day after admission for pancreatitis.  Patient has no details as to what the cause of the pancreatitis was  Past Medical History:  Diagnosis Date   Allergy    Anginal pain ([place])    one episode 4-5 years ago-[facility] [place]-nonspecific-no problems since-   Anxiety    Arthritis    multiple areas of arthritis- DDD   Asthma    recent admit to ER 01/11/12 for exac of asthma   Calcaneal fracture    Duplicated ureter, right    Fibromyalgia    Hypertension    Kidney stones    Obesity (BMI 30.0-34.9)    Pancreatitis 1986   PONV (postoperative nausea and vomiting)    Poor venous access  hard to start iv-   Seizures ([place]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, was treated /w phenobarbital for a while, then taken off    Shortness of breath dyspnea    Wears glasses     Past Surgical History:  Procedure Laterality Date   ABDOMINAL HYSTERECTOMY     ANTERIOR LAT LUMBAR FUSION Left 08/17/2018   Procedure: LUMBAR THREE-FOUR ANTERIOR LATERAL INTERBODY FUSION;  Surgeon: Earnie Larsson, MD;  Location: [place];  Service: Neurosurgery;  Laterality: Left;   APPENDECTOMY  08/05/2017   BLADDER SUSPENSION  03/01/2012   Procedure: TRANSVAGINAL TAPE (TVT) PROCEDURE;  Surgeon: Emily Filbert, MD;  Location: [facility];  Service: Gynecology;  Laterality: N/A;   BUNIONECTOMY  1995   Right   CHOLECYSTECTOMY  late 1980's       CYSTOSCOPY  03/01/2012   Procedure:  CYSTOSCOPY;  Surgeon: Emily Filbert, MD;  Location: [facility];  Service: Gynecology;  Laterality: N/A;   CYSTOSCOPY W/ URETERAL STENT PLACEMENT Left 12/15/2014   Procedure: CYSTOSCOPY, LEFT URETEROSCOPYU WITH STONE EXTRACTION;  Surgeon: Irine Seal, MD;  Location: [facility];  Service: Urology;  Laterality: Left;   CYSTOSCOPY W/ URETERAL STENT PLACEMENT Left 10/02/2016   Procedure: CYSTOSCOPY WITH RETROGRADE PYELOGRAM/URETERAL STENT PLACEMENT ureteroscopy, basket extraction;  Surgeon: Cleon Gustin, MD;  Location: [facility];  Service: Urology;  Laterality: Left;   HARDWARE REMOVAL N/A 12/22/2016   Procedure: Lumbar Four-Five Removal of Hardware;  Surgeon: Earnie Larsson, MD;  Location: [place];  Service: Neurosurgery;  Laterality: N/A;   INCISION AND DRAINAGE ABSCESS Left 07/19/2013   Procedure: INCISION AND DRAINAGE LEFT LOWER EXTERMITY HEMATOMA;  Surgeon: Imogene Burn. Georgette Dover, MD;  Location: [place];  Service: General;  Laterality: Left;  Excision left lower leg mass   KNEE CARTILAGE SURGERY     left   KNEE SURGERY Left    x 2, Murphy/Sue, corrected Patella displacement    LAMINECTOMY     LAPAROSCOPIC APPENDECTOMY N/A 08/05/2017   Procedure: APPENDECTOMY LAPAROSCOPIC;  Surgeon: Kieth Brightly Arta Bruce, MD;  Location: [facility];  Service: General;  Laterality: N/A;   LUMBAR LAMINECTOMY  10/06   L4-5, Ray   LUMBAR PERCUTANEOUS PEDICLE SCREW 1 LEVEL Left 08/17/2018   Procedure: LUMBAR THREE-FOUR LUMBAR PERCUTANEOUS PEDICLE SCREW;  Surgeon: Earnie Larsson, MD;  Location: [place];  Service: Neurosurgery;  Laterality: Left;   R compressed pronator Right    Sypher, R arm   RESECTION DISTAL CLAVICAL Right 06/30/2017   Procedure: RIGHT RESECTION DISTAL CLAVICAL;  Surgeon: Melrose Nakayama, MD;  Location: [place];  Service: Orthopedics;  Laterality: Right;   SHOULDER ACROMIOPLASTY Right 06/30/2017   Procedure: RIGHT SHOULDER ACROMIOPLASTY;  Surgeon: Melrose Nakayama, MD;  Location: [place];  Service:  Orthopedics;  Laterality: Right;   SHOULDER ARTHROSCOPY Right 06/30/2017   Procedure: ARTHROSCOPY DEBRIDEMENT RIGHT SHOULDER;  Surgeon: Melrose Nakayama, MD;  Location: [place];  Service: Orthopedics;  Laterality: Right;   SPINAL FUSION     TENDON REPAIR  2013   TENDON REPAIR Right 02/01/12   TONSILLECTOMY  age 60    Prior to Admission medications   Medication Sig Start Date End Date Taking? Authorizing Provider  albuterol (PROAIR HFA) 108 (90 Base) MCG/ACT inhaler INHALE TWO PUFFS EVERY FOUR HOURS AS NEEDED SHORTNESS OF BREATH Patient taking differently: Inhale 2 puffs into the lungs every 4 (four) hours as needed for shortness of breath.  08/27/16  Yes Biagio Borg, MD  ALPRAZolam Duanne Moron) 1 MG tablet Take 1  tablet (1 mg total) by mouth 2 (two) times daily as needed for anxiety. 09/10/19 10/11/19 Yes Lucille Passy, MD  cyclobenzaprine (FLEXERIL) 5 MG tablet Take 5 mg by mouth 3 (three) times daily as needed for muscle spasms. 07/07/19  Yes [provider]  DULoxetine (CYMBALTA) 30 MG capsule Take 90 mg by mouth at bedtime. 07/07/19  Yes [provider]  estrogens, conjugated, (PREMARIN) 0.3 MG tablet TAKE 1 TABLET BY MOUTH DAILY (TAKE FOR 21 DAYS THEN DO NOT TAKE FOR 7DAYS) Patient taking differently: Take 0.3 mg by mouth as directed. Take 1 tablet (0.3mg ) every day for 21 days, then do not take medication for 7 days 11/30/18  Yes Lucille Passy, MD  fluticasone (FLONASE) 50 MCG/ACT nasal spray USE 2 SPRAYS INTO EACH NOSTRIL ONCE DAILY AS DIRECTED Patient taking differently: Place 2 sprays into both nostrils daily.  11/30/18  Yes Lucille Passy, MD  ibuprofen (ADVIL,MOTRIN) 200 MG tablet You can safely take 3 tablets every 6 hours as needed for pain.  He can alternate this with plain Tylenol.  You can also alternate with the prescribed narcotic.  I would recommend to use the Tylenol and ibuprofen as your first line pain medications. Patient taking differently: Take 600 mg by mouth  every 6 (six) hours as needed (pain). You can safely take 3 tablets every 6 hours as needed for pain.  He can alternate this with plain Tylenol.  You can also alternate with the prescribed narcotic.  I would recommend to use the Tylenol and ibuprofen as your first line pain medications. 08/06/17  Yes Earnstine Regal, PA-C  losartan (COZAAR) 50 MG tablet TAKE 1 TABLET BY MOUTH ONCE DAILY Patient taking differently: Take 50 mg by mouth daily.  05/18/19  Yes Lucille Passy, MD  mupirocin ointment (BACTROBAN) 2 % Apply to wound prior to apply dressing once daily. 07/11/19  Yes Lucille Passy, MD  oxymorphone (OPANA) 5 MG tablet Take 5 mg by mouth every 4 (four) hours.   Yes [provider]  silver sulfADIAZINE (SILVADENE) 1 % cream Apply 1 application topically daily. 07/11/19  Yes Lucille Passy, MD  triamterene-hydrochlorothiazide (MAXZIDE-25) 37.5-25 MG tablet TAKE 1 TABLET BY MOUTH ONCE DAILY Patient taking differently: Take 1 tablet by mouth daily.  05/18/19  Yes Lucille Passy, MD  zolpidem (AMBIEN) 10 MG tablet Take 1 tablet (10 mg total) by mouth at bedtime. 07/11/19  Yes Lucille Passy, MD  ipratropium-albuterol (DUONEB) 0.5-2.5 (3) MG/3ML SOLN Take 3 mLs by nebulization every 4 (four) hours as needed. Patient not taking: Reported on 07/18/2019 01/04/13   Lucille Passy, MD  mometasone-formoterol [facility]) 200-5 MCG/ACT AERO Inhale 2 puffs into the lungs 2 (two) times daily. Patient not taking: Reported on 07/18/2019 08/27/16   Biagio Borg, MD    Scheduled Meds:  enoxaparin (LOVENOX) injection  40 mg Subcutaneous Q24H   fluticasone  2 spray Each Nare Daily   mupirocin ointment   Topical Daily   pantoprazole (PROTONIX) IV  40 mg Intravenous Q12H   silver sulfADIAZINE  1 application Topical Daily   Infusions:  dextrose 5 % and 0.9% NaCl 100 mL/hr at 07/19/19 1015   PRN Meds: acetaminophen **OR** acetaminophen, albuterol, diphenhydrAMINE, hydrALAZINE, HYDROmorphone (DILAUDID) injection,  ketorolac, LORazepam, ondansetron **OR** ondansetron (ZOFRAN) IV, senna-docusate   Allergies as of 07/18/2019 - Review Complete 07/18/2019  Allergen Reaction Noted   Latex Itching, Swelling, and Dermatitis 01/11/2012   Lisinopril Cough 12/07/2012   Codeine  Nausea And Vomiting    Hydrocodone Itching    Tape Rash 04/30/2015    Family History  Problem Relation Age of Onset   Cancer Mother        pancreatic cancer   Heart disease Father 60   Colon polyps Father    Colon cancer Paternal Grandfather    Colon cancer Maternal Grandmother    Uterine cancer Other        Grandmother   Breast cancer Other        Grandmother   Ovarian cancer Other        Grandmother   Diabetes Other        mother   Other Neg Hx     Social History   Socioeconomic History   Marital status: Married    Spouse name: Not on file   Number of children: 3   Years of education: Not on file   Highest education level: Not on file  Occupational History   Occupation: Product manager: [employer]    Comment: [employer] (6th grade)  Social Needs   Emergency planning/management officer strain: Not on file   Food insecurity    Worry: Not on file    Inability: Not on file   Transportation needs    Medical: Not on file    Non-medical: Not on file  Tobacco Use   Smoking status: Never Smoker   Smokeless tobacco: Never Used  Substance and Sexual Activity   Alcohol use: Yes    Alcohol/week: 7.0 standard drinks    Types: 7 Standard drinks or equivalent per week    Comment: Occasional- wine or liquor twice a week   Drug use: No   Sexual activity: Yes    Birth control/protection: Surgical  Lifestyle   Physical activity    Days per week: Not on file    Minutes per session: Not on file   Stress: Not on file  Relationships   Social connections    Talks on phone: Not on file    Gets together: Not on file    Attends religious service: Not on file    Active member of club or  organization: Not on file    Attends meetings of clubs or organizations: Not on file    Relationship status: Not on file   Intimate partner violence    Fear of current or ex partner: Not on file    Emotionally abused: Not on file    Physically abused: Not on file    Forced sexual activity: Not on file  Other Topics Concern   Not on file  Social History Narrative   Caffeine use- 2 daily   Lives c husband   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  No angina GU:  No hematuria, no frequency GI: See HPI Heme: No unusual or excessive bleeding or bruising. Transfusions: None Neuro:  No headaches, no peripheral tingling or numbness.  Balance issues have led to falls in recent years. Derm:  No itching, no rash or sores.  Endocrine:  No sweats or chills.  No polyuria or dysuria Immunization: Not queried. Travel:  None beyond  local counties in last few months.    PHYSICAL EXAM: Vital signs in last 24 hours: Vitals:   07/19/19 0600 07/19/19 1254  BP:  129/78  Pulse: (!) 105 92  Resp:  16  Temp:  98.7 F (37.1 C)  SpO2:  95%   Wt Readings from Last 3 Encounters:  07/19/19 89.9 kg  07/11/19 89.9 kg  06/28/19 87.8 kg    General: Patient actually looks well.  She is worried and somewhat uncomfortable, not toxic appearing. Head: No facial asymmetry or swelling.  No signs of head trauma. Eyes: No scleral icterus.  No conjunctival pallor.  EOMI. Ears: Not hard of hearing Nose: No discharge, no congestion Mouth: Oral mucosa pink, moist, clear.  Tongue midline.  Excellent teeth. Neck: No JVD, no masses, no thyromegaly.  C-spine surgery scar intact,  erythematous/fresh looking in lower right neck Lungs: No cough, no labored breathing.  Lungs clear bilaterally.  Good breath sounds. Heart: RRR.  No MRG.  S1, S2 present Abdomen: Soft.  Mild to at most moderate tenderness at the mid, left and upper abdomen.  Not particularly sore in the right mid or lower abdomen.  No bruits, hernias, HSM.Marland Kitchen   Rectal: Deferred Musc/Skeltl: No joint redness, swelling or gross deformity. Extremities: No CCE. Neurologic: Alert.  Oriented x3.  Good historian.  No tremor, limb strength not tested.  But moves all 4 limbs. Skin: No jaundice, no rash, no sores. Tattoos: None Nodes: No cervical adenopathy Psych: Anxious but cooperative, not agitated.  Intake/Output from previous day: 11/09 0701 - 11/10 0700 In: 1461.3 [I.V.:1461.3] Out: -  Intake/Output this shift: Total I/O In: 400.9 [I.V.:400.9] Out: -   LAB RESULTS: Recent Labs    07/18/19 1019 07/19/19 0408  WBC 11.8* 12.9*  HGB 14.0 13.6  HCT 43.3 45.0  PLT 280 253   BMET Lab Results  Component Value Date   NA 139 07/19/2019   NA 137 07/18/2019   NA 139 07/11/2019   K 4.4 07/19/2019   K 3.4 (L) 07/18/2019   K 3.0 (L) 07/11/2019   CL 107 07/19/2019   CL 101 07/18/2019   CL 98 07/11/2019   CO2 21 (L) 07/19/2019   CO2 26 07/18/2019   CO2 31 07/11/2019   GLUCOSE 131 (H) 07/19/2019   GLUCOSE 101 (H) 07/18/2019   GLUCOSE 91 07/11/2019   BUN 7 07/19/2019   BUN 9 07/18/2019   BUN 10 07/11/2019   CREATININE 0.86 07/19/2019   CREATININE 0.66 07/18/2019   CREATININE 0.64 07/11/2019   CALCIUM 8.9 07/19/2019   CALCIUM 10.6 (H) 07/18/2019   CALCIUM 9.2 07/11/2019   LFT Recent Labs    07/18/19 1019 07/19/19 0408  PROT 7.6 7.4  ALBUMIN 4.1 3.8  AST 19 33  ALT 18 22  ALKPHOS 101 98  BILITOT 0.5 0.7   PT/INR Lab Results  Component Value Date   INR 1.0 07/18/2019   INR 0.92 09/19/2013   Hepatitis Panel No results for input(s): HEPBSAG, HCVAB, HEPAIGM, HEPBIGM in the last 72  hours. C-Diff No components found for: CDIFF Lipase     Component Value Date/Time   LIPASE 41 07/19/2019 0408    Drugs of Abuse  No results found for: LABOPIA, COCAINSCRNUR, LABBENZ, AMPHETMU, THCU, LABBARB   RADIOLOGY STUDIES: Ct Abdomen Pelvis W Contrast  Result Date: 07/18/2019 CLINICAL DATA:  Abdominal pain, nausea, history of pancreatitis EXAM: CT ABDOMEN AND PELVIS WITH CONTRAST TECHNIQUE: Multidetector CT imaging of the abdomen and pelvis was performed using  the standard protocol following bolus administration of intravenous contrast. CONTRAST:  124mL OMNIPAQUE IOHEXOL 300 MG/ML  SOLN COMPARISON:  08/05/2017 FINDINGS: Lower chest: Lung bases are clear. Hepatobiliary: Lobulated 6.8 x 8.4 cm cyst in segment 8 (series 2/image 16). Additional small left hepatic lobe cysts. Status post cholecystectomy. No intrahepatic or extrahepatic ductal dilatation. Pancreas: Mild peripancreatic inflammatory changes along the pancreatic tail (series 2/image 33), suggesting acute pancreatitis. No peripancreatic fluid collection. Stable 15 mm cystic lesion along the posterior pancreatic head (series 2/image 33). Spleen: Within normal limits. Adrenals/Urinary Tract: Stable 13 mm right adrenal nodule (series 2/image 27), favoring a benign adrenal adenoma. Left adrenal gland is within normal limits. Kidneys are within normal limits.  No hydronephrosis. Bladder is within normal limits. Stomach/Bowel: Stomach is within normal limits. No evidence of bowel obstruction. Prior appendectomy. Vascular/Lymphatic: No evidence of abdominal aortic aneurysm. Atherosclerotic calcifications of the abdominal aorta and branch vessels. No suspicious abdominopelvic lymphadenopathy. Reproductive: Status post hysterectomy. Bilateral ovaries are within normal limits, noting a 2.3 cm left ovarian cyst/follicle. Other: No abdominopelvic ascites. Musculoskeletal: Status post PLIF at L3-4. Interbody fusion with posterior laminectomy at L4-5.  Mild degenerative changes of the visualized thoracolumbar spine. IMPRESSION: Mild acute pancreatitis. No peripancreatic fluid collection/pseudocyst. Additional stable ancillary findings as above. Electronically Signed   By: Julian Hy M.D.   On: 07/18/2019 11:47      IMPRESSION:   *    Acute pancreatitis.  Recurrent pancreatitis many years ago of unclear etiology, it recurred following cholecystectomy.  Never determined etiology of pain based on multiple EGDs, 2 separate ERCPs, imaging studies. The CT scan currently confirms pancreatitis at the tail and "stable" cystic lesion at the posterior pancreatic head though this was not mentioned in CT report of 07/2017.    PLAN:     -Per Dr. Henrene Pastor below   Azucena Freed  07/19/2019, 1:39 PM Phone [phone]  GI ATTENDING  History, laboratories, x-rays reviewed.  Physician assistant comprehensive consultation note reviewed.  Agree with the recorded note.  Patient with chronic pain syndrome and a history of functional diagnoses who presents with severe abdominal pain.  Mild inflammation of the tail the pancreas and minimal elevation of lipase suggesting mild pancreatitis.  She does have family history of pancreatic cancer.  Recommend the following:  1.  Supportive care with hydration and pain management 2.  Advance diet as tolerated 3.  May benefit from dedicated MRCP of the pancreas given family history of pancreatic cancer Dr. Bryan Lemma will round on the patient in the morning  Carren Blakley N. Geri Seminole., M.D. [facility] Division of Gastroenterology

## 2019-07-19 NOTE — Progress Notes (Signed)
PROGRESS NOTE    Melissa Allen  [id number] DOB: 1966/06/17 DOA: 07/18/2019 PCP: Lucille Passy, MD   Brief Narrative:  HPI per Dr. Niel Hummer on 07/18/2019 Melissa Allen is a 53 y.o. female with past medical history significant for hypertension, fibromyalgia, multiple back surgery on pain management, prior history of pancreatitis in 1985, history of cholecystectomy and appendectomy who presents complaining of severe abdominal pain, that is started over the weekend.  She reports,  pain has been progressively getting worse, rated pain 10 out of 10, feels like if somebody punched her abdomen, radiates to her back and shoulder blade.  It is accompanied by nausea no vomiting.  She had a normal bowel movement the day prior to admission.  She relate the pain is similar to prior episode of pancreatitis.  She denies any recent alcohol intake, last drink was a month ago.  Years ago she used to drink. She denies fever, cough, shortness of breath, chest pain.  Evaluation in the ED: Sodium 137, potassium 3.4, glucose 101, BUN 9, creatinine 1.6, calcium 10.6, lipase 88, AST 10, ALT 18, white blood cell 11.8, hemoglobin 14.0, platelet 280.  UA 0-5 white blood cell.  SARS coronavirus 2 pending.  CT abdomen and pelvis:Mild acute pancreatitis. No peripancreatic fluid  collection/pseudocyst.Bilateral ovaries are within normal limits, noting a 2.3 cm left ovarian cyst/follicle.  **Interim History  Continues to have abdominal pain and lipase is normalized but we will continue IV fluid hydration and consult GI for further evaluation recommendations  Assessment & Plan:   Active Problems:   Anxiety state   Depression, recurrent ([place])   Essential hypertension   Asthma   GERD   Acute appendicitis   Pancreatitis   Acute pancreatitis  SIRS from Acute on Chronic Pancreatitis; Idiopathic -Patient denies recent alcohol intake, has history of cholecystectomy. -Lipase level on admission was 88 and repeat was  41 today -CT scan showed mild acute pancreatitis with no peripancreatic fluid collection or pseudocyst; he does have a stable 15 mm cystic lesion along the posterior pancreatic head and is status post cholecystectomy with no intrahepatic or extrahepatic ductal dilatation noted on the CT scan -Continued with IV fluids with D5 normal saline at reduced rate from 125 mL's per hour to 100 MLS per hour; received a 2 lactated Ringer bolus in the ED -Continue with pain control with IV Hydromorphone 1 to 2 mg every 3 hours as needed for severe pain; also continue ketorolac 15 mg every 6 as needed for severe pain and received a dose of ketamine 50 mg yesterday in the ED -Continue with antiemetics with Zofran 4 mg p.o./IV every 6 as needed for nausea -Continue with IV PPI with pantoprazole 40 mg every 12 -Continue with bowel rest and n.p.o. status -Because this is patient's seventh pancreatitis episode have consulted Gastroenterology for further work-up evaluation -WBC went from 11.8 and is now 12.9 -We will check a triglyceride level  Hypertension -Hold on Cozaar and Maxide to avoid future dehydration.   -Will order as needed Hydralazine 10 mg IV every 6 as needed for high blood pressure for systolic blood pressure greater than 160  Hypokalemia -Patient's potassium this morning was 4.4 and improved after repletion -Continue to monitor and replete as necessary -Repeat CMP in a.m.  GERD -Continue with IV PPI with pantoprazole 40 mg every 12h  Depression and Anxiety -Continue Hold Cymbalta while n.p.o. -Continue lorazepam 0.5 mg IV twice daily as needed for anxiety  Chronic Pain Syndrome and  Management -On opana at home. Hold while NPO -Continue with pain control with IV Hydromorphone 1 to 2 mg every 3 hours as needed for severe pain; also continue Ketorolac 15 mg every 6 as needed for severe pain -Patient is status post PLIF at L3-L4 with interbody fusion with posterior laminectomy at L4-L5 and  has mild degenerative changes of the visualized thoracolumbar spine  Asthma -C/w inhaler 2 puffs elations every 4 as needed for shortness of breath and continue with fluticasone spray 2 sprays each nare daily  Obesity -Estimated body mass index is 34.02 kg/m as calculated from the following:   Height as of this encounter: 5\' 4"  (1.626 m).   Weight as of this encounter: 89.9 kg.  -Weight Loss and Dietary Counseling given   Hypercalcemia -In setting of dehydration from Pancreatitis -Calcium level on admission was 10.6 and after IV fluid hydration is now 8.9 -Continue to monitor and trend and repeat CMP  Hyperglycemia -Patient's blood sugar on admission was 101 and repeat this AM was 131 -Check Hemoglobin A1c -Continue to Monitor Blood Sugars Carefully and if necessary will place on Sensitive Novolog SSI AC  Metabolic Acidosis -Mild as CO2 was 21, Anion Gap 11, Chloride was 107 -Continue with IV fluid hydration as above -Continue monitor and trend repeat CMP in a.m.  DVT prophylaxis: Enoxaparin 40 mg sq q24h Code Status: FULL CODE  Family Communication: No family present at bedside  Disposition Plan: Pending further improvement and advancement of diet and tolerance and clearance by gastroenterology  Consultants:   Gastroenterology   Procedures:  None   Antimicrobials:  Anti-infectives (From admission, onward)   None     Subjective: Seen and examined at bedside and still having some abdominal pain.  No nausea or vomiting but states this is happened to her multiple times.  States she has been stable for quite some time but then ended up having a death in the family last week.  No other concerns or complaints at this time.  Objective: Vitals:   07/19/19 0434 07/19/19 0600 07/19/19 0921 07/19/19 1254  BP: (!) 111/42   129/78  Pulse: (!) 115 (!) 105  92  Resp:    16  Temp:    98.7 F (37.1 C)  TempSrc:    Oral  SpO2: 93%   95%  Weight:   89.9 kg   Height:   5\' 4"   (1.626 m)     Intake/Output Summary (Last 24 hours) at 07/19/2019 1332 Last data filed at 07/19/2019 1000 Gross per 24 hour  Intake 1862.13 ml  Output --  Net 1862.13 ml   Filed Weights   07/19/19 0921  Weight: 89.9 kg   Examination: Physical Exam:  Constitutional: WN/WD obese Caucasian female in NAD and appears slightly uncomfortable Eyes: Lids and conjunctivae normal, sclerae anicteric  ENMT: External Ears, Nose appear normal. Grossly normal hearing. Mucous membranes are moist.  Neck: Appears normal, supple, no cervical masses, normal ROM, no appreciable thyromegaly; no JVD Respiratory: Diminished to auscultation bilaterally, no wheezing, rales, rhonchi or crackles. Normal respiratory effort and patient is not tachypenic. No accessory muscle use. Unlabored breathing  Cardiovascular: RRR, no murmurs / rubs / gallops. S1 and S2 auscultated. Trace extremity edema.  Abdomen: Soft, Tender to palpate in the epigastric are with radiation into her back, Distended 2/2 body habitus. Bowel sounds positive x4.  GU: Deferred. Musculoskeletal: No clubbing / cyanosis of digits/nails. No joint deformity upper and lower extremities. Skin: No rashes, lesions, ulcers on a  limited skin evaluation. No induration; Warm and dry.  Neurologic: CN 2-12 grossly intact with no focal deficits.  Romberg sign and cerebellar reflexes not assessed.  Psychiatric: Normal judgment and insight. Alert and oriented x 3. Normal mood and appropriate affect.   Data Reviewed: I have personally reviewed following labs and imaging studies  CBC: Recent Labs  Lab 07/18/19 1019 07/19/19 0408  WBC 11.8* 12.9*  HGB 14.0 13.6  HCT 43.3 45.0  MCV 88.2 93.4  PLT 280 [id number]   Basic Metabolic Panel: Recent Labs  Lab 07/18/19 1019 07/19/19 0408  NA 137 139  K 3.4* 4.4  CL 101 107  CO2 26 21*  GLUCOSE 101* 131*  BUN 9 7  CREATININE 0.66 0.86  CALCIUM 10.6* 8.9   GFR: Estimated Creatinine Clearance: 81.2 mL/min (by  C-G formula based on SCr of 0.86 mg/dL). Liver Function Tests: Recent Labs  Lab 07/18/19 1019 07/19/19 0408  AST 19 33  ALT 18 22  ALKPHOS 101 98  BILITOT 0.5 0.7  PROT 7.6 7.4  ALBUMIN 4.1 3.8   Recent Labs  Lab 07/18/19 1019 07/19/19 0408  LIPASE 88* 41   No results for input(s): AMMONIA in the last 168 hours. Coagulation Profile: Recent Labs  Lab 07/18/19 1019  INR 1.0   Cardiac Enzymes: No results for input(s): CKTOTAL, CKMB, CKMBINDEX, TROPONINI in the last 168 hours. BNP (last 3 results) No results for input(s): PROBNP in the last 8760 hours. HbA1C: No results for input(s): HGBA1C in the last 72 hours. CBG: No results for input(s): GLUCAP in the last 168 hours. Lipid Profile: No results for input(s): CHOL, HDL, LDLCALC, TRIG, CHOLHDL, LDLDIRECT in the last 72 hours. Thyroid Function Tests: No results for input(s): TSH, T4TOTAL, FREET4, T3FREE, THYROIDAB in the last 72 hours. Anemia Panel: No results for input(s): VITAMINB12, FOLATE, FERRITIN, TIBC, IRON, RETICCTPCT in the last 72 hours. Sepsis Labs: No results for input(s): PROCALCITON, LATICACIDVEN in the last 168 hours.  Recent Results (from the past 240 hour(s))  Wound culture     Status: None   Collection Time: 07/11/19  2:47 PM   Specimen: Leg, Left; Wound  Result Value Ref Range Status   MICRO NUMBER: [id number]  Final   SPECIMEN QUALITY: Adequate  Final   SOURCE: LEFT LATERAL UPPER THIGH BURN WOUND  Final   STATUS: FINAL  Final   GRAM STAIN:   Final    No white blood cells seen No epithelial cells seen No organisms seen   RESULT: No Growth  Final  SARS CORONAVIRUS 2 (TAT 6-24 HRS) Nasopharyngeal Nasopharyngeal Swab     Status: None   Collection Time: 07/18/19  2:50 PM   Specimen: Nasopharyngeal Swab  Result Value Ref Range Status   SARS Coronavirus 2 NEGATIVE NEGATIVE Final    Comment: (NOTE) SARS-CoV-2 target nucleic acids are NOT DETECTED. The SARS-CoV-2 RNA is generally detectable in upper  and lower respiratory specimens during the acute phase of infection. Negative results do not preclude SARS-CoV-2 infection, do not rule out co-infections with other pathogens, and should not be used as the sole basis for treatment or other patient management decisions. Negative results must be combined with clinical observations, patient history, and epidemiological information. The expected result is Negative. Fact Sheet for Patients: SugarRoll.be Fact Sheet for Healthcare Providers: https://www.woods-mathews.com/ This test is not yet approved or cleared by the [place] FDA and  has been authorized for detection and/or diagnosis of SARS-CoV-2 by FDA under an Emergency Use Authorization (EUA).  This EUA will remain  in effect (meaning this test can be used) for the duration of the COVID-19 declaration under Section 56 4(b)(1) of the Act, 21 U.S.C. section 360bbb-3(b)(1), unless the authorization is terminated or revoked sooner. Performed at [facility], [place] [address], [place], [place] [address]     Radiology Studies: Ct Abdomen Pelvis W Contrast  Result Date: 07/18/2019 CLINICAL DATA:  Abdominal pain, nausea, history of pancreatitis EXAM: CT ABDOMEN AND PELVIS WITH CONTRAST TECHNIQUE: Multidetector CT imaging of the abdomen and pelvis was performed using the standard protocol following bolus administration of intravenous contrast. CONTRAST:  164mL OMNIPAQUE IOHEXOL 300 MG/ML  SOLN COMPARISON:  08/05/2017 FINDINGS: Lower chest: Lung bases are clear. Hepatobiliary: Lobulated 6.8 x 8.4 cm cyst in segment 8 (series 2/image 16). Additional small left hepatic lobe cysts. Status post cholecystectomy. No intrahepatic or extrahepatic ductal dilatation. Pancreas: Mild peripancreatic inflammatory changes along the pancreatic tail (series 2/image 33), suggesting acute pancreatitis. No peripancreatic fluid collection. Stable 15 mm cystic lesion along  the posterior pancreatic head (series 2/image 33). Spleen: Within normal limits. Adrenals/Urinary Tract: Stable 13 mm right adrenal nodule (series 2/image 27), favoring a benign adrenal adenoma. Left adrenal gland is within normal limits. Kidneys are within normal limits.  No hydronephrosis. Bladder is within normal limits. Stomach/Bowel: Stomach is within normal limits. No evidence of bowel obstruction. Prior appendectomy. Vascular/Lymphatic: No evidence of abdominal aortic aneurysm. Atherosclerotic calcifications of the abdominal aorta and branch vessels. No suspicious abdominopelvic lymphadenopathy. Reproductive: Status post hysterectomy. Bilateral ovaries are within normal limits, noting a 2.3 cm left ovarian cyst/follicle. Other: No abdominopelvic ascites. Musculoskeletal: Status post PLIF at L3-4. Interbody fusion with posterior laminectomy at L4-5. Mild degenerative changes of the visualized thoracolumbar spine. IMPRESSION: Mild acute pancreatitis. No peripancreatic fluid collection/pseudocyst. Additional stable ancillary findings as above. Electronically Signed   By: Julian Hy M.D.   On: 07/18/2019 11:47   Scheduled Meds:  enoxaparin (LOVENOX) injection  40 mg Subcutaneous Q24H   fluticasone  2 spray Each Nare Daily   mupirocin ointment   Topical Daily   pantoprazole (PROTONIX) IV  40 mg Intravenous Q12H   silver sulfADIAZINE  1 application Topical Daily   Continuous Infusions:  dextrose 5 % and 0.9% NaCl 100 mL/hr at 07/19/19 1015    LOS: 0 days   Kerney Elbe, DO [facility] PAGER is on AMION  If 7PM-7AM, please contact night-coverage [email or web address]

## 2019-07-19 NOTE — Plan of Care (Signed)
  Problem: Clinical Measurements: Goal: Ability to maintain clinical measurements within normal limits will improve Outcome: Progressing   Problem: Coping: Goal: Level of anxiety will decrease Outcome: Progressing   Problem: Activity: Goal: Risk for activity intolerance will decrease Outcome: Progressing   Problem: Coping: Goal: Level of anxiety will decrease Outcome: Progressing   Problem: Activity: Goal: Risk for activity intolerance will decrease Outcome: Not Progressing   Problem: Nutrition: Goal: Adequate nutrition will be maintained Outcome: Not Progressing   Problem: Pain Managment: Goal: General experience of comfort will improve Outcome: Not Progressing   Problem: Education: Goal: Knowledge of General Education information will improve Description: Including pain rating scale, medication(s)/side effects and non-pharmacologic comfort measures Outcome: Completed/Met

## 2019-07-20 ENCOUNTER — Inpatient Hospital Stay (HOSPITAL_COMMUNITY): Payer: BC Managed Care – PPO

## 2019-07-20 DIAGNOSIS — R112 Nausea with vomiting, unspecified: Secondary | ICD-10-CM

## 2019-07-20 DIAGNOSIS — J4521 Mild intermittent asthma with (acute) exacerbation: Secondary | ICD-10-CM

## 2019-07-20 DIAGNOSIS — K7689 Other specified diseases of liver: Secondary | ICD-10-CM

## 2019-07-20 DIAGNOSIS — R1110 Vomiting, unspecified: Secondary | ICD-10-CM

## 2019-07-20 DIAGNOSIS — K3589 Other acute appendicitis without perforation or gangrene: Secondary | ICD-10-CM

## 2019-07-20 LAB — COMPREHENSIVE METABOLIC PANEL
ALT: 55 U/L — ABNORMAL HIGH (ref 0–44)
AST: 60 U/L — ABNORMAL HIGH (ref 15–41)
Albumin: 3.4 g/dL — ABNORMAL LOW (ref 3.5–5.0)
Alkaline Phosphatase: 89 U/L (ref 38–126)
Anion gap: 8 (ref 5–15)
BUN: 10 mg/dL (ref 6–20)
CO2: 22 mmol/L (ref 22–32)
Calcium: 8.3 mg/dL — ABNORMAL LOW (ref 8.9–10.3)
Chloride: 110 mmol/L (ref 98–111)
Creatinine, Ser: 0.73 mg/dL (ref 0.44–1.00)
GFR calc Af Amer: >60 mL/min (ref >60)
GFR calc non Af Amer: >60 mL/min (ref >60)
Glucose, Bld: 185 mg/dL — ABNORMAL HIGH (ref 70–99)
Potassium: 3.4 mmol/L — ABNORMAL LOW (ref 3.5–5.1)
Sodium: 140 mmol/L (ref 135–145)
Total Bilirubin: 0.4 mg/dL (ref 0.3–1.2)
Total Protein: 6.7 g/dL (ref 6.5–8.1)

## 2019-07-20 LAB — CBC WITH DIFFERENTIAL/PLATELET
Abs Immature Granulocytes: 0.08 10*3/uL — ABNORMAL HIGH (ref 0.00–0.07)
Basophils Absolute: 0 10*3/uL (ref 0.0–0.1)
Basophils Relative: 0 %
Eosinophils Absolute: 0 10*3/uL (ref 0.0–0.5)
Eosinophils Relative: 0 %
HCT: 40.5 % (ref 36.0–46.0)
Hemoglobin: 12.1 g/dL (ref 12.0–15.0)
Immature Granulocytes: 1 %
Lymphocytes Relative: 9 %
Lymphs Abs: 0.8 10*3/uL (ref 0.7–4.0)
MCH: 28.2 pg (ref 26.0–34.0)
MCHC: 29.9 g/dL — ABNORMAL LOW (ref 30.0–36.0)
MCV: 94.4 fL (ref 80.0–100.0)
Monocytes Absolute: 0.4 10*3/uL (ref 0.1–1.0)
Monocytes Relative: 4 %
Neutro Abs: 8.1 10*3/uL — ABNORMAL HIGH (ref 1.7–7.7)
Neutrophils Relative %: 86 %
Platelets: 225 10*3/uL (ref 150–400)
RBC: 4.29 MIL/uL (ref 3.87–5.11)
RDW: 13.6 % (ref 11.5–15.5)
WBC: 9.5 10*3/uL (ref 4.0–10.5)
nRBC: 0 % (ref 0.0–0.2)

## 2019-07-20 LAB — HEMOGLOBIN A1C
Hgb A1c MFr Bld: 5.7 % — ABNORMAL HIGH (ref 4.8–5.6)
Mean Plasma Glucose: 116.89 mg/dL

## 2019-07-20 LAB — MAGNESIUM: Magnesium: 2 mg/dL (ref 1.7–2.4)

## 2019-07-20 LAB — PHOSPHORUS: Phosphorus: 3.3 mg/dL (ref 2.5–4.6)

## 2019-07-20 MED ORDER — POTASSIUM CHLORIDE 10 MEQ/100ML IV SOLN
10.0000 meq | INTRAVENOUS | Status: AC
  Administered 2019-07-20 (×2): 10 meq via INTRAVENOUS
  Filled 2019-07-20 (×2): qty 100

## 2019-07-20 MED ORDER — FUROSEMIDE 10 MG/ML IJ SOLN
40.0000 mg | Freq: Once | INTRAMUSCULAR | Status: AC
  Administered 2019-07-20: 40 mg via INTRAVENOUS
  Filled 2019-07-20: qty 4

## 2019-07-20 MED ORDER — GADOBUTROL 1 MMOL/ML IV SOLN
10.0000 mL | Freq: Once | INTRAVENOUS | Status: AC | PRN
  Administered 2019-07-20: 10 mL via INTRAVENOUS

## 2019-07-20 MED ORDER — LACTATED RINGERS IV SOLN
INTRAVENOUS | Status: DC
  Administered 2019-07-20: 10:00:00 via INTRAVENOUS

## 2019-07-20 NOTE — Progress Notes (Signed)
Daily Rounding Note  07/20/2019, 8:08 AM  LOS: 1 day   SUBJECTIVE:   Chief complaint:   Acute pancreatitis  Pain continues, worse on left side into flank/back.  Relieved for 1/5 hours w Dilaudid but left waiting w discomfort for next dose.  Nauseated, no emesis.  Both pain, nausea worse w moevement.   No SOB. Tachy into 1teens.   Partial day I/O: + 2.7 liters but urine not strictly measured before yesterday evening.  No BM's, + flatus. IVF timed out at 720 this AM.     OBJECTIVE:         Vital signs in last 24 hours:    Temp:  [98.5 F (36.9 C)-99.4 F (37.4 C)] 99.4 F (37.4 C) (11/11 0700) Pulse Rate:  [92-105] 95 (11/11 0700) Resp:  [16-20] 20 (11/11 0700) BP: (106-129)/(57-78) 106/65 (11/11 0700) SpO2:  [90 %-95 %] 94 % (11/11 0700) Weight:  [89.9 kg] 89.9 kg (11/10 0921) Last BM Date: 07/16/19 Filed Weights   07/19/19 0921  Weight: 89.9 kg   General: uncomfortable.  Not toxic but looks worse than yesterday. Drowsy  Heart: tachy, regular Chest: clear but reduced BS (weak effort) Abdomen: obese, soft, tender w/o guard/rebound diffusely across mid to left abdomen.  BS quiet  Extremities: no CCE Neuro/Psych:  Sleep, losing train of thought/stopping what she is saying in mid speech but when prodded able to continue train of thought, not confused.  No tremors  Intake/Output from previous day: 11/10 0701 - 11/11 0700 In: 3054.7 [P.O.:50; I.V.:3004.7] Out: 350 [Urine:350]  Intake/Output this shift: No intake/output data recorded.  Lab Results: Recent Labs    07/18/19 1019 07/19/19 0408 07/20/19 0454  WBC 11.8* 12.9* 9.5  HGB 14.0 13.6 12.1  HCT 43.3 45.0 40.5  PLT 280 253 225   BMET Recent Labs    07/18/19 1019 07/19/19 0408 07/20/19 0454  NA 137 139 140  K 3.4* 4.4 3.4*  CL 101 107 110  CO2 26 21* 22  GLUCOSE 101* 131* 185*  BUN 9 7 10   CREATININE 0.66 0.86 0.73  CALCIUM 10.6* 8.9 8.3*    LFT Recent Labs    07/18/19 1019 07/19/19 0408 07/20/19 0454  PROT 7.6 7.4 6.7  ALBUMIN 4.1 3.8 3.4*  AST 19 33 60*  ALT 18 22 55*  ALKPHOS 101 98 89  BILITOT 0.5 0.7 0.4   PT/INR Recent Labs    07/18/19 1019  LABPROT 12.7  INR 1.0   Hepatitis Panel No results for input(s): HEPBSAG, HCVAB, HEPAIGM, HEPBIGM in the last 72 hours.  Studies/Results: Ct Abdomen Pelvis W Contrast  Result Date: 07/18/2019 CLINICAL DATA:  Abdominal pain, nausea, history of pancreatitis EXAM: CT ABDOMEN AND PELVIS WITH CONTRAST TECHNIQUE: Multidetector CT imaging of the abdomen and pelvis was performed using the standard protocol following bolus administration of intravenous contrast. CONTRAST:  168mL OMNIPAQUE IOHEXOL 300 MG/ML  SOLN COMPARISON:  08/05/2017 FINDINGS: Lower chest: Lung bases are clear. Hepatobiliary: Lobulated 6.8 x 8.4 cm cyst in segment 8 (series 2/image 16). Additional small left hepatic lobe cysts. Status post cholecystectomy. No intrahepatic or extrahepatic ductal dilatation. Pancreas: Mild peripancreatic inflammatory changes along the pancreatic tail (series 2/image 33), suggesting acute pancreatitis. No peripancreatic fluid collection. Stable 15 mm cystic lesion along the posterior pancreatic head (series 2/image 33). Spleen: Within normal limits. Adrenals/Urinary Tract: Stable 13 mm right adrenal nodule (series 2/image 27), favoring a benign adrenal adenoma. Left adrenal gland is within  normal limits. Kidneys are within normal limits.  No hydronephrosis. Bladder is within normal limits. Stomach/Bowel: Stomach is within normal limits. No evidence of bowel obstruction. Prior appendectomy. Vascular/Lymphatic: No evidence of abdominal aortic aneurysm. Atherosclerotic calcifications of the abdominal aorta and branch vessels. No suspicious abdominopelvic lymphadenopathy. Reproductive: Status post hysterectomy. Bilateral ovaries are within normal limits, noting a 2.3 cm left ovarian  cyst/follicle. Other: No abdominopelvic ascites. Musculoskeletal: Status post PLIF at L3-4. Interbody fusion with posterior laminectomy at L4-5. Mild degenerative changes of the visualized thoracolumbar spine. IMPRESSION: Mild acute pancreatitis. No peripancreatic fluid collection/pseudocyst. Additional stable ancillary findings as above. Electronically Signed   By: Julian Hy M.D.   On: 07/18/2019 11:47   Scheduled Meds:  enoxaparin (LOVENOX) injection  40 mg Subcutaneous Q24H   fluticasone  2 spray Each Nare Daily   mupirocin ointment   Topical Daily   pantoprazole  40 mg Oral Q0600   silver sulfADIAZINE  1 application Topical Daily   Continuous Infusions:  potassium chloride     PRN Meds:.acetaminophen **OR** acetaminophen, albuterol, diphenhydrAMINE, hydrALAZINE, HYDROmorphone (DILAUDID) injection, ketorolac, LORazepam, ondansetron **OR** ondansetron (ZOFRAN) IV, senna-docusate   ASSESMENT:   *  Acute, idiopathic pancreatitis.  Hx same several years ago w negative wup as to cause then.  GB out in late 80s.   Pancreatitis in tail w cystic lesion at [facility] per CT MRI, MRCP pending Previously nml LFTs now mildly elevated.  Lipase has normalized.    *   Chronic narcotics for musc/skeletal pain.  Multiple spine surgeries.    *  Minor hypokalemia.  2 runs IVF ordered this AM.    *  Hyperglycemia.  No prior dx DM. A1c 5.7 c/w "prediabetes".     PLAN   *    Start LR at 200/hour.     *  cmet in AM.    *  May need to reconfigure pain mgt given her use of chronic Opana at home.  ? PCA?   *  Continue clears as desired.    *   Await completion MRI, MRCP    Azucena Freed  07/20/2019, 8:08 AM Phone [phone]

## 2019-07-20 NOTE — Plan of Care (Signed)
  Problem: Clinical Measurements: Goal: Diagnostic test results will improve Outcome: Progressing   Problem: Coping: Goal: Level of anxiety will decrease Outcome: Progressing   

## 2019-07-20 NOTE — Plan of Care (Signed)
  Problem: Safety: Goal: Ability to remain free from injury will improve Outcome: Progressing   

## 2019-07-20 NOTE — Progress Notes (Signed)
PROGRESS NOTE    Melissa Allen    Code Status: Full Code  [id number] DOB: May 13, 1966 DOA: 07/18/2019  PCP: Lucille Passy, MD    Hospital Summary  This is a 53 year old female with past medical history of hypertension, fibromyalgia, multiple back surgeries on pain management, history of pancreatitis, cholecystectomy and appendectomy who presented with severe abdominal pain found to have pancreatitis on admission via CT abdomen pelvis and elevated lipase.  GI has been consulted  A & P   Active Problems:   Anxiety state   Depression, recurrent ([place])   Essential hypertension   Asthma   GERD   Hepatic cyst   Acute appendicitis   Pancreatitis   Acute pancreatitis   Non-intractable vomiting   SIRS secondary to acute on chronic pancreatitis, idiopathic etiology vitals improved, still with pain.  No recent alcohol use, triglycerides 180 and unlikely etiology, history of cholecystectomy.  Has 15 mm cystic lesion along the posterior pancreatic head.  Has been getting D5 normal saline following 2 L LR bolus in ED.  GI noted mild inflammation of the pancreatic tail and minimal elevation of lipase with a family history of pancreatic cancer.  MRI/MRCP today per GI -Supportive care with hydration and pain management -Changed D5 LR to LR at increased rate -Continue antiemetics -Follow-up MRI/MRCP results  Large hepatic cyst with multiple small hepatic cysts -Per GI: May consider future referral to hematology center once pancreatitis is resolved  Chronic pain syndrome on Opana at home-on hold.  Frequently getting IV Dilaudid and Toradol in setting of acute pain as above.  She is status post PLIF L3-L4 with interbody fusion and posterior laminectomy at L4-L5 with mild degenerative changes of the visualized thoracolumbar spine -Palliative care consult for pain management as patient may need PCA  Asthma wheezing on exam without shortness of breath and tolerating room air -Continue as needed  inhaler  Obesity BMI 34 -Patient was given also dietary counseling  Hypercalcemia resolved with IV fluids  Hyperglycemia patient has been getting D5 with lactated Ringer -Continue D5  Metabolic acidosis resolved  Hypokalemia -Repleted   DVT prophylaxis: Lovenox Diet: Clear liquid diet Family Communication: No family at bedside Disposition Plan: Pending clinical stability  Consultants  GI  Procedures  MRCP 11/11  Antibiotics  None      Subjective   Patient seen bedside lying flat.  States that she still has abdominal pain which is somewhat controlled with her pain regimen however this does not last very long.  Denies chest pain, nausea, vomiting, diarrhea, shortness of breath.  Denies any other complaints.  States that she was recently treated for a sinus infection with Augmentin at the end of October.  Objective   Vitals:   07/19/19 2122 07/20/19 0700 07/20/19 1320 07/20/19 1417  BP: (!) 125/57 106/65 123/85   Pulse: (!) 105 95 97   Resp: 20 20 18    Temp: 98.5 F (36.9 C) 99.4 F (37.4 C) 98.6 F (37 C)   TempSrc: Oral Oral Oral   SpO2: 90% 94% 91% 91%  Weight:      Height:        Intake/Output Summary (Last 24 hours) at 07/20/2019 1736 Last data filed at 07/20/2019 1500 Gross per 24 hour  Intake 3229.98 ml  Output 450 ml  Net 2779.98 ml   Filed Weights   07/19/19 0921  Weight: 89.9 kg    Examination:  Physical Exam Vitals signs and nursing note reviewed.  Constitutional:  Appearance: Normal appearance. She is obese.     Comments: Appears uncomfortable  HENT:     Head: Normocephalic and atraumatic.     Nose: Nose normal.     Mouth/Throat:     Mouth: Mucous membranes are moist.  Eyes:     Extraocular Movements: Extraocular movements intact.  Neck:     Musculoskeletal: Normal range of motion. No neck rigidity.  Cardiovascular:     Rate and Rhythm: Normal rate and regular rhythm.  Pulmonary:     Effort: Pulmonary effort is  normal.     Breath sounds: Wheezing present.  Abdominal:     General: Abdomen is flat.     Tenderness: There is abdominal tenderness in the epigastric area.  Musculoskeletal: Normal range of motion.        General: No swelling.  Neurological:     General: No focal deficit present.     Mental Status: She is alert. Mental status is at baseline.  Psychiatric:        Mood and Affect: Mood normal.        Behavior: Behavior normal.     Data Reviewed: I have personally reviewed following labs and imaging studies  CBC: Recent Labs  Lab 07/18/19 1019 07/19/19 0408 07/20/19 0454  WBC 11.8* 12.9* 9.5  NEUTROABS  --   --  8.1*  HGB 14.0 13.6 12.1  HCT 43.3 45.0 40.5  MCV 88.2 93.4 94.4  PLT 280 253 [id number]   Basic Metabolic Panel: Recent Labs  Lab 07/18/19 1019 07/19/19 0408 07/20/19 0454  NA 137 139 140  K 3.4* 4.4 3.4*  CL 101 107 110  CO2 26 21* 22  GLUCOSE 101* 131* 185*  BUN 9 7 10   CREATININE 0.66 0.86 0.73  CALCIUM 10.6* 8.9 8.3*  MG  --   --  2.0  PHOS  --   --  3.3   GFR: Estimated Creatinine Clearance: 87.3 mL/min (by C-G formula based on SCr of 0.73 mg/dL). Liver Function Tests: Recent Labs  Lab 07/18/19 1019 07/19/19 0408 07/20/19 0454  AST 19 33 60*  ALT 18 22 55*  ALKPHOS 101 98 89  BILITOT 0.5 0.7 0.4  PROT 7.6 7.4 6.7  ALBUMIN 4.1 3.8 3.4*   Recent Labs  Lab 07/18/19 1019 07/19/19 0408  LIPASE 88* 41   No results for input(s): AMMONIA in the last 168 hours. Coagulation Profile: Recent Labs  Lab 07/18/19 1019  INR 1.0   Cardiac Enzymes: No results for input(s): CKTOTAL, CKMB, CKMBINDEX, TROPONINI in the last 168 hours. BNP (last 3 results) No results for input(s): PROBNP in the last 8760 hours. HbA1C: Recent Labs    07/20/19 0454  HGBA1C 5.7*   CBG: No results for input(s): GLUCAP in the last 168 hours. Lipid Profile: Recent Labs    07/19/19 0408  TRIG 180*   Thyroid Function Tests: No results for input(s): TSH, T4TOTAL,  FREET4, T3FREE, THYROIDAB in the last 72 hours. Anemia Panel: No results for input(s): VITAMINB12, FOLATE, FERRITIN, TIBC, IRON, RETICCTPCT in the last 72 hours. Sepsis Labs: No results for input(s): PROCALCITON, LATICACIDVEN in the last 168 hours.  Recent Results (from the past 240 hour(s))  Wound culture     Status: None   Collection Time: 07/11/19  2:47 PM   Specimen: Leg, Left; Wound  Result Value Ref Range Status   MICRO NUMBER: [id number]  Final   SPECIMEN QUALITY: Adequate  Final   SOURCE: LEFT LATERAL UPPER THIGH BURN WOUND  Final   STATUS: FINAL  Final   GRAM STAIN:   Final    No white blood cells seen No epithelial cells seen No organisms seen   RESULT: No Growth  Final  SARS CORONAVIRUS 2 (TAT 6-24 HRS) Nasopharyngeal Nasopharyngeal Swab     Status: None   Collection Time: 07/18/19  2:50 PM   Specimen: Nasopharyngeal Swab  Result Value Ref Range Status   SARS Coronavirus 2 NEGATIVE NEGATIVE Final    Comment: (NOTE) SARS-CoV-2 target nucleic acids are NOT DETECTED. The SARS-CoV-2 RNA is generally detectable in upper and lower respiratory specimens during the acute phase of infection. Negative results do not preclude SARS-CoV-2 infection, do not rule out co-infections with other pathogens, and should not be used as the sole basis for treatment or other patient management decisions. Negative results must be combined with clinical observations, patient history, and epidemiological information. The expected result is Negative. Fact Sheet for Patients: SugarRoll.be Fact Sheet for Healthcare Providers: https://www.woods-mathews.com/ This test is not yet approved or cleared by the [place] FDA and  has been authorized for detection and/or diagnosis of SARS-CoV-2 by FDA under an Emergency Use Authorization (EUA). This EUA will remain  in effect (meaning this test can be used) for the duration of the COVID-19 declaration under  Section 56 4(b)(1) of the Act, 21 U.S.C. section 360bbb-3(b)(1), unless the authorization is terminated or revoked sooner. Performed at [facility], [place] [address] [place], [place] [address]          Radiology Studies: Dg Chest Port 1 View  Result Date: 07/20/2019 CLINICAL DATA:  Wheezing with abdominal pain and nausea. EXAM: PORTABLE CHEST 1 VIEW COMPARISON:  09/06/2018 FINDINGS: Lungs are somewhat hypoinflated with mild hazy prominence of the perihilar markings suggesting mild vascular congestion. No focal lobar consolidation or effusion. Cardiomediastinal silhouette and remainder of the exam is unchanged. IMPRESSION: Hypoinflation with suggestion of mild vascular congestion. Electronically Signed   By: Marin Olp M.D.   On: 07/20/2019 13:26        Scheduled Meds: . enoxaparin (LOVENOX) injection  40 mg Subcutaneous Q24H  . fluticasone  2 spray Each Nare Daily  . mupirocin ointment   Topical Daily  . pantoprazole  40 mg Oral Q0600  . silver sulfADIAZINE  1 application Topical Daily   Continuous Infusions: . lactated ringers 200 mL/hr at 07/20/19 0951     LOS: 1 day    Time spent: 25 minutes with over 50% of the time coordinating the patient's care    Harold Hedge, DO [facility] Pager [phone]  If 7PM-7AM, please contact night-coverage [email or web address] Password [facility] 07/20/2019, 5:36 PM

## 2019-07-20 NOTE — Significant Event (Signed)
Called to patient bedside for hypoxia. Returned from MRCP with oxygen saturation 81% on room air, progressed to 15L [place]. Crackles to bases, no tachypnea, no respiratory distress. Net +6L. CXR and 40 mg lasix ordered. ECHO ordered for AM (previous ECHO 09/20/2013 with EF 55-60, G1DD).

## 2019-07-21 ENCOUNTER — Inpatient Hospital Stay (HOSPITAL_COMMUNITY): Payer: BC Managed Care – PPO

## 2019-07-21 DIAGNOSIS — I509 Heart failure, unspecified: Secondary | ICD-10-CM

## 2019-07-21 DIAGNOSIS — J9621 Acute and chronic respiratory failure with hypoxia: Secondary | ICD-10-CM

## 2019-07-21 DIAGNOSIS — E8770 Fluid overload, unspecified: Secondary | ICD-10-CM

## 2019-07-21 DIAGNOSIS — J9601 Acute respiratory failure with hypoxia: Secondary | ICD-10-CM

## 2019-07-21 LAB — COMPREHENSIVE METABOLIC PANEL
ALT: 45 U/L — ABNORMAL HIGH (ref 0–44)
AST: 35 U/L (ref 15–41)
Albumin: 3.3 g/dL — ABNORMAL LOW (ref 3.5–5.0)
Alkaline Phosphatase: 79 U/L (ref 38–126)
Anion gap: 9 (ref 5–15)
BUN: 12 mg/dL (ref 6–20)
CO2: 24 mmol/L (ref 22–32)
Calcium: 8.4 mg/dL — ABNORMAL LOW (ref 8.9–10.3)
Chloride: 105 mmol/L (ref 98–111)
Creatinine, Ser: 0.63 mg/dL (ref 0.44–1.00)
GFR calc Af Amer: >60 mL/min (ref >60)
GFR calc non Af Amer: >60 mL/min (ref >60)
Glucose, Bld: 110 mg/dL — ABNORMAL HIGH (ref 70–99)
Potassium: 3.1 mmol/L — ABNORMAL LOW (ref 3.5–5.1)
Sodium: 138 mmol/L (ref 135–145)
Total Bilirubin: 0.9 mg/dL (ref 0.3–1.2)
Total Protein: 6.3 g/dL — ABNORMAL LOW (ref 6.5–8.1)

## 2019-07-21 LAB — ECHOCARDIOGRAM COMPLETE
Height: 64 in
Weight: 3171.1 oz

## 2019-07-21 LAB — CBC
HCT: 36.1 % (ref 36.0–46.0)
Hemoglobin: 11.3 g/dL — ABNORMAL LOW (ref 12.0–15.0)
MCH: 28.7 pg (ref 26.0–34.0)
MCHC: 31.3 g/dL (ref 30.0–36.0)
MCV: 91.6 fL (ref 80.0–100.0)
Platelets: 227 10*3/uL (ref 150–400)
RBC: 3.94 MIL/uL (ref 3.87–5.11)
RDW: 13.2 % (ref 11.5–15.5)
WBC: 11.8 10*3/uL — ABNORMAL HIGH (ref 4.0–10.5)
nRBC: 0 % (ref 0.0–0.2)

## 2019-07-21 LAB — MAGNESIUM: Magnesium: 1.9 mg/dL (ref 1.7–2.4)

## 2019-07-21 MED ORDER — CYCLOBENZAPRINE HCL 5 MG PO TABS
5.0000 mg | ORAL_TABLET | Freq: Three times a day (TID) | ORAL | Status: DC | PRN
  Administered 2019-07-21 – 2019-07-24 (×3): 5 mg via ORAL
  Filled 2019-07-21 (×3): qty 1

## 2019-07-21 MED ORDER — POTASSIUM CHLORIDE 10 MEQ/100ML IV SOLN
10.0000 meq | INTRAVENOUS | Status: AC
  Administered 2019-07-21 (×4): 10 meq via INTRAVENOUS
  Filled 2019-07-21 (×4): qty 100

## 2019-07-21 MED ORDER — LOSARTAN POTASSIUM 50 MG PO TABS
50.0000 mg | ORAL_TABLET | Freq: Every day | ORAL | Status: DC
  Administered 2019-07-21 – 2019-07-24 (×4): 50 mg via ORAL
  Filled 2019-07-21 (×4): qty 1

## 2019-07-21 MED ORDER — ALBUTEROL SULFATE (2.5 MG/3ML) 0.083% IN NEBU
2.5000 mg | INHALATION_SOLUTION | RESPIRATORY_TRACT | Status: DC | PRN
  Administered 2019-07-21: 2.5 mg via RESPIRATORY_TRACT
  Filled 2019-07-21: qty 3

## 2019-07-21 MED ORDER — DULOXETINE HCL 60 MG PO CPEP
90.0000 mg | ORAL_CAPSULE | Freq: Every day | ORAL | Status: DC
  Administered 2019-07-21 – 2019-07-23 (×3): 90 mg via ORAL
  Filled 2019-07-21 (×3): qty 1

## 2019-07-21 MED ORDER — BISACODYL 5 MG PO TBEC
10.0000 mg | DELAYED_RELEASE_TABLET | Freq: Once | ORAL | Status: AC
  Administered 2019-07-21: 12:00:00 10 mg via ORAL
  Filled 2019-07-21: qty 2

## 2019-07-21 MED ORDER — FUROSEMIDE 10 MG/ML IJ SOLN
40.0000 mg | Freq: Once | INTRAMUSCULAR | Status: AC
  Administered 2019-07-21: 40 mg via INTRAVENOUS
  Filled 2019-07-21: qty 4

## 2019-07-21 NOTE — Progress Notes (Addendum)
Daily Rounding Note  07/21/2019, 8:50 AM  LOS: 2 days   SUBJECTIVE:   Chief complaint:   Acute pancreatitis.  Pancreatic cyst.  Hepatic cyst. Overall pain is no worse.  Pain still worse on left side and radiating around to the back.  Some mild nausea but no emesis.  Aching limited amounts of clear liquid tray, sometimes p.o. makes the pain worse. Last BM was 6 days ago, on Saturday.  Is passing flatus. Total I's and O's measured: positive 32 mL.  Urine output 1.9 liters.   Wondering why she is not getting several of her outpatient meds which include Cymbalta, Flexeril, Colace, Opana and blood pressure meds.  OBJECTIVE:         Vital signs in last 24 hours:    Temp:  [96.9 F (36.1 C)-100 F (37.8 C)] 99.3 F (37.4 C) (11/12 0835) Pulse Rate:  [97-108] 99 (11/12 0835) Resp:  [16-20] 20 (11/12 0835) BP: (123-146)/(59-91) 133/77 (11/12 0835) SpO2:  [91 %-99 %] 99 % (11/12 0835) Last BM Date: 07/16/19 Filed Weights   07/19/19 0921  Weight: 89.9 kg   General: More comfortable today still uncomfortable.  Does not look toxic or ill. Heart: RRR. Chest: Clear bilaterally.  No labored breathing or cough. Abdomen: Soft.  Active bowel sounds.  Tenderness without guarding mostly on the left abdomen. Extremities: No CCE. Neuro/Psych: Oriented x3.  Appropriate.  Less anxious.  Fluid speech.  No limb weakness or gross deficits.  Intake/Output from previous day: 11/11 0701 - 11/12 0700 In: 1952.5 [P.O.:120; I.V.:1627.9; IV Piggyback:204.6] Out: 1920 [Urine:1920]  Intake/Output this shift: No intake/output data recorded.  Lab Results: Recent Labs    07/19/19 0408 07/20/19 0454 07/21/19 0559  WBC 12.9* 9.5 11.8*  HGB 13.6 12.1 11.3*  HCT 45.0 40.5 36.1  PLT 253 225 227   BMET Recent Labs    07/19/19 0408 07/20/19 0454 07/21/19 0559  NA 139 140 138  K 4.4 3.4* 3.1*  CL 107 110 105  CO2 21* 22 24  GLUCOSE 131*  185* 110*  BUN 7 10 12   CREATININE 0.86 0.73 0.63  CALCIUM 8.9 8.3* 8.4*   LFT Recent Labs    07/19/19 0408 07/20/19 0454 07/21/19 0559  PROT 7.4 6.7 6.3*  ALBUMIN 3.8 3.4* 3.3*  AST 33 60* 35  ALT 22 55* 45*  ALKPHOS 98 89 79  BILITOT 0.7 0.4 0.9   PT/INR Recent Labs    07/18/19 1019  LABPROT 12.7  INR 1.0   Hepatitis Panel No results for input(s): HEPBSAG, HCVAB, HEPAIGM, HEPBIGM in the last 72 hours.  Studies/Results: Mr 3d Recon At Scanner  Result Date: 07/20/2019 CLINICAL DATA:  Acute pancreatitis.  First episode.  Abdominal pain. EXAM: MRI ABDOMEN WITH CONTRAST (WITH MRCP) TECHNIQUE: Multiplanar multisequence MR imaging of the abdomen was performed following the administration of intravenous contrast. Heavily T2-weighted images of the biliary and pancreatic ducts were obtained, and three-dimensional MRCP images were rendered by post processing. CONTRAST:  52mL GADAVIST GADOBUTROL 1 MMOL/ML IV SOLN COMPARISON:  07/18/2019 abdominopelvic CT. FINDINGS: Lower chest: Interval development of small bilateral pleural effusions, larger on the right. New septal thickening, most consistent with pulmonary edema. Bilateral breast implants which are collapsed but incompletely imaged. Hepatobiliary: Multiple hepatic cysts, including a dominant anterior right hepatic lobe lesion of 8.1 cm. Cholecystectomy.  No choledocholithiasis or biliary duct dilatation. Pancreas: mild residual peripancreatic edema adjacent the tail, including on 37/3. Felt to be  relatively similar. A cystic lesion within the pancreatic head measures 1.1 cm on 35/3. No suspicious postcontrast characteristics, including on 56/1102. No peripancreatic fluid collection. No pancreatic necrosis. No pancreas divisum. Spleen:  Normal in size, without focal abnormality. Adrenals/Urinary Tract: 1.2 cm left adrenal nodule demonstrates signal dropout on out of phase imaging, consistent with an adenoma. Normal kidneys, without  hydronephrosis. Stomach/Bowel: Normal stomach and abdominal bowel loops. Vascular/Lymphatic: Aortic atherosclerosis. Patent portal, splenic, hepatic veins. No abdominal adenopathy. Other:  No significant free fluid. Musculoskeletal: Suboptimally evaluated left-sided lumbar spine fixation. Mild anasarca. IMPRESSION: 1. Similar mild non complicated pancreatitis. No evidence of choledocholithiasis, pancreas divisum, or other cause identified. 2. Development of congestive heart failure since the prior CT of 07/18/2019. 3. Right adrenal adenoma. 4.  Aortic Atherosclerosis (ICD10-I70.0). 5. Chronic cystic lesion within the pancreatic head, favoring pseudocyst. Electronically Signed   By: Abigail Miyamoto M.D.   On: 07/20/2019 19:51   Dg Chest Port 1 View  Result Date: 07/20/2019 CLINICAL DATA:  Hypoxia. EXAM: PORTABLE CHEST 1 VIEW COMPARISON:  Chest radiograph earlier this day. Lung bases from abdominal CT 07/18/2019 FINDINGS: Septal thickening and Kerley B-lines consistent with pulmonary edema, increased from prior exam. New superimposed patchy opacity in the right mid lung. Unchanged cardiomegaly. Small pleural effusions are seen on MRI earlier this day. No pneumothorax. Surgical hardware in the lower cervical spine is partially included. IMPRESSION: 1. Worsening pulmonary edema since earlier this day. Similar cardiomegaly. 2. Patchy opacity in the right mid lung may be confluent edema versus pneumonia/aspiration. Electronically Signed   By: Keith Rake M.D.   On: 07/20/2019 20:44   Dg Chest Port 1 View  Result Date: 07/20/2019 CLINICAL DATA:  Wheezing with abdominal pain and nausea. EXAM: PORTABLE CHEST 1 VIEW COMPARISON:  09/06/2018 FINDINGS: Lungs are somewhat hypoinflated with mild hazy prominence of the perihilar markings suggesting mild vascular congestion. No focal lobar consolidation or effusion. Cardiomediastinal silhouette and remainder of the exam is unchanged. IMPRESSION: Hypoinflation with  suggestion of mild vascular congestion. Electronically Signed   By: Marin Olp M.D.   On: 07/20/2019 13:26   Mr Abdomen With Mrcp W Contrast  Result Date: 07/20/2019 CLINICAL DATA:  Acute pancreatitis.  First episode.  Abdominal pain. EXAM: MRI ABDOMEN WITH CONTRAST (WITH MRCP) TECHNIQUE: Multiplanar multisequence MR imaging of the abdomen was performed following the administration of intravenous contrast. Heavily T2-weighted images of the biliary and pancreatic ducts were obtained, and three-dimensional MRCP images were rendered by post processing. CONTRAST:  51mL GADAVIST GADOBUTROL 1 MMOL/ML IV SOLN COMPARISON:  07/18/2019 abdominopelvic CT. FINDINGS: Lower chest: Interval development of small bilateral pleural effusions, larger on the right. New septal thickening, most consistent with pulmonary edema. Bilateral breast implants which are collapsed but incompletely imaged. Hepatobiliary: Multiple hepatic cysts, including a dominant anterior right hepatic lobe lesion of 8.1 cm. Cholecystectomy.  No choledocholithiasis or biliary duct dilatation. Pancreas: mild residual peripancreatic edema adjacent the tail, including on 37/3. Felt to be relatively similar. A cystic lesion within the pancreatic head measures 1.1 cm on 35/3. No suspicious postcontrast characteristics, including on 56/1102. No peripancreatic fluid collection. No pancreatic necrosis. No pancreas divisum. Spleen:  Normal in size, without focal abnormality. Adrenals/Urinary Tract: 1.2 cm left adrenal nodule demonstrates signal dropout on out of phase imaging, consistent with an adenoma. Normal kidneys, without hydronephrosis. Stomach/Bowel: Normal stomach and abdominal bowel loops. Vascular/Lymphatic: Aortic atherosclerosis. Patent portal, splenic, hepatic veins. No abdominal adenopathy. Other:  No significant free fluid. Musculoskeletal: Suboptimally evaluated left-sided lumbar spine  fixation. Mild anasarca. IMPRESSION: 1. Similar mild non  complicated pancreatitis. No evidence of choledocholithiasis, pancreas divisum, or other cause identified. 2. Development of congestive heart failure since the prior CT of 07/18/2019. 3. Right adrenal adenoma. 4.  Aortic Atherosclerosis (ICD10-I70.0). 5. Chronic cystic lesion within the pancreatic head, favoring pseudocyst. Electronically Signed   By: Abigail Miyamoto M.D.   On: 07/20/2019 19:51    ASSESMENT:   *   Acute, idiopathic pancreatitis. Hx same as several years ago.  Cholecystectomy late 9s.  Pancreatitis involves tail, w 1.1 cm cystic lesion at [facility], favoring pseudocyst.  On MRCP the pancreatitis is mild.  No anatomic irregularities of pancreas. On Maxzide PTA, though this is not a new medication for the patient, ? could the HCTZ have caused pancreatitis, though pt on this for ~ 10 years. Fluctuating, new, minor elevation of transaminases. WBCs rising 12.9  >> 9.5 >> 11.3  *    Hepatic cysts.  One dominant, 8.1 cm, cyst in the right lobe.  *    Chronic pain, chronic narcotics.  *    Mild tachycardia.    *   New CHF per MRCP in setting of IV fluids.  Received 40 IV Lasix.  Lactated Ringer's discontinued, no IVF in use.   2 D echo ordered.  2015 echo showed LVEF 55 to 60%.  Grade 1 diastolic dysfunction, trivial aortic valve regurge.  *    Hypokalemia.  4 runs potassium ordered as of 8 AM this morning.   PLAN   *  Await echocardiogram.    *   Needs to get restarted on the bulk of her outpatient meds.  Most importantly Cymbalta which should not be acutely discontinued.    *    Does not need antibiotics from a pancreatitis perspective.  No evidence of pancreatic necrosis on MRI.  *   Suspect patient will need endoscopic ultrasound.  This is not urgent and might be best done after recovery from acute pancreatitis.  *  One, 10 mg dose Dulcolax po now.     Azucena Freed  07/21/2019, 8:50 AM Phone [phone]  GI ATTENDING  Interval history data reviewed.  Agree with interval  progress note as outlined above without additions or deletions.  We will continue to follow.  Docia Chuck. Geri Seminole., M.D. [facility]

## 2019-07-21 NOTE — Plan of Care (Signed)
?  Problem: Clinical Measurements: ?Goal: Ability to maintain clinical measurements within normal limits will improve ?Outcome: Progressing ?Goal: Will remain free from infection ?Outcome: Progressing ?Goal: Diagnostic test results will improve ?Outcome: Progressing ?  ?

## 2019-07-21 NOTE — Progress Notes (Signed)
Palliative Medicine Consult order noted. Discussed with Dr Rowe Pavy, PMT attending. Our team manages pain and symptoms related to cancer and other terminal diagnoses. For post-op, chronic, or acute pain concerns, please consult pain management or anesthesia as appropriate.   Consult order will be cancelled.  Marjie Skiff Laporcha Marchesi, RN, BSN, [facility] Palliative Medicine Team 07/21/2019 9:07 AM Office [phone]

## 2019-07-21 NOTE — Progress Notes (Addendum)
Breathing improved, pt O2 6l Hi FLO sat 94%, has voided over 1.5 liters since Lasix administered as ordered. Pt had 2 large brown formed stools post Dulcolax. Pt able to ambulate to [facility]. SRP, RN

## 2019-07-21 NOTE — Significant Event (Addendum)
Paged at roughly 4:40 pm regarding patient desaturating after ambulating. Patient was reported to be in 10s, improved to 90s requiring 10L nasal canula and then dropped breifly to 76% before improving.   Upon arrival, patient was satting in mid 90s while on 10 L [place] with some conversational dyspnea. I elevated the head of the bed with some improvement in symptoms. She did receive high volumes of LR overnight and yesterday for her pancreatitis which was discontinued this morning.   Lung exam with Crackles at bases and diffuse end expiratory wheeze.   Likely volume overloaded after receiving high dose fluids. Unlikely PE as she is on DVT prophylaxis. -CXR stat -Lasix 40 mg -Nebulizer -follow up Echo  Full note to follow  Marva Panda, DO Pager [phone]

## 2019-07-21 NOTE — Progress Notes (Addendum)
PROGRESS NOTE    Melissa Allen    Code Status: Full Code  [id number] DOB: 1966/02/09 DOA: 07/18/2019  PCP: Lucille Passy, MD    Hospital Summary  This is a 53 year old female with past medical history of hypertension, fibromyalgia, multiple back surgeries on pain management, history of pancreatitis, cholecystectomy and appendectomy who presented with severe abdominal pain found to have pancreatitis on admission via CT abdomen pelvis and elevated lipase.  GI has been consulted.  11/11-11/12 overnight: Patient noted to be hypoxic and volume overloaded.  She was given Lasix 40 mg IV x1 and echo was ordered.  11/12 daytime: Patient regarding hypoxia again 70s to 80s, placed on 10 L nasal cannula..  At bedside patient with crackles and wheezes on exam.  Chest x-ray ordered.  Final read of echo not available yet.  Given Lasix and nebulizer treatment  A & P   Active Problems:   Anxiety state   Depression, recurrent (HCC)   Essential hypertension   Asthma   GERD   Hepatic cyst   Acute appendicitis   Pancreatitis   Acute pancreatitis   Non-intractable vomiting   SIRS secondary to acute on chronic pancreatitis, idiopathic etiology vitals improved, still with pain but improving.  No recent alcohol use, triglycerides 180 and unlikely etiology, history of cholecystectomy.  Has 15 mm cystic lesion along the posterior pancreatic head. GI noted mild inflammation of the pancreatic tail and minimal elevation of lipase with a family history of pancreatic cancer.  MRI/MRCP 11/11: No evidence of choledocholithiasis, chronic cystic lesion in pancreatic head favoring pseudocyst noted, -Supportive care  -Continue antiemetics -Follow with GI  Acute hypoxic respiratory failure secondary to volume overload.  Keep high doses of lactated Ringer's in setting of pancreatitis over the past 24 hours.  Desatted yesterday to 8s and again this afternoon to 70s to 80s requiring 10 L nasal cannula.  See  significant event note from 11/12 for further details.  Chest x-ray at bedside preliminary read appears volume overloaded -Hold IV fluids -Lasix 40 mg IV -Follow-up stat chest x-ray -Follow-up echo  -I/O's and daily weights  Large hepatic cyst with multiple small hepatic cysts -Per GI: May consider future referral to hematology center once pancreatitis is resolved  Chronic pain syndrome on Opana at home-on hold since admission.  Frequently getting IV Dilaudid and Toradol in setting of acute pain as above.  Home Cymbalta and Flexeril has been on hold as she was n.p.o. on admission.  She is status post PLIF L3-L4 with interbody fusion and posterior laminectomy at L4-L5 with mild degenerative changes of the visualized thoracolumbar spine -Restart home Cymbalta -Restart home Flexeril as needed  Asthma wheezing on exam without shortness of breath and tolerating room air -Continue as needed inhaler   Hypertension -Restart home losartan  Tension headache  -continue as needed Tylenol -Restart home Cymbalta and Flexeril -Getting as needed Toradol and Dilaudid as well  Obesity BMI 34 -Patient was given also dietary counseling  Hypercalcemia resolved with IV fluids  Hyperglycemia patient has been getting D5 with lactated Ringer -Discontinue fluids  Metabolic acidosis resolved  Hypokalemia -Replete   DVT prophylaxis: Lovenox Family Communication: Discussed with husband over the phone. Disposition Plan: Pending clinical stability  Consultants  GI  Procedures  MRCP 11/11  Antibiotics  None      Subjective   Seen and examined this morning lying flat resting comfortably.  Initially on 10 L nasal cannula which was turned down to 3 L and remained  with SPO2 mid 90s.  At that point she admitted to a persistent headache which is bandlike in nature not relieved with Tylenol and denied any other complaints.  Paged later this afternoon regarding hypoxia.  On presentation I had  patient head of bed elevated, continue 10 L nasal cannula, chest x-ray, Lasix and nebulizer treatment ordered.  Patient remained awake and alert throughout the event.  Objective   Vitals:   07/20/19 1943 07/21/19 0243 07/21/19 0409 07/21/19 0835  BP: 135/84 132/75 (!) 146/91 133/77  Pulse:  98 (!) 108 99  Resp:  18 18 20   Temp:  98.5 F (36.9 C) 100 F (37.8 C) 99.3 F (37.4 C)  TempSrc:  Oral Oral Oral  SpO2:  94% 96% 99%  Weight:      Height:        Intake/Output Summary (Last 24 hours) at 07/21/2019 1652 Last data filed at 07/21/2019 1500 Gross per 24 hour  Intake 1141.74 ml  Output 1820 ml  Net -678.26 ml   Filed Weights   07/19/19 0921  Weight: 89.9 kg    Examination:  Physical Exam Vitals signs and nursing note reviewed.  Constitutional:      Appearance: Normal appearance.  HENT:     Head: Normocephalic and atraumatic.     Nose: Nose normal.     Mouth/Throat:     Mouth: Mucous membranes are moist.  Neck:     Musculoskeletal: Normal range of motion. No neck rigidity.  Cardiovascular:     Rate and Rhythm: Normal rate and regular rhythm.  Pulmonary:     Effort: Respiratory distress present.     Breath sounds: Wheezing and rales present.  Abdominal:     General: Abdomen is flat.     Palpations: Abdomen is soft.     Tenderness: There is abdominal tenderness in the epigastric area.  Musculoskeletal: Normal range of motion.        General: No swelling.  Neurological:     General: No focal deficit present.     Mental Status: She is alert. Mental status is at baseline.  Psychiatric:        Mood and Affect: Mood normal.        Behavior: Behavior normal.     Data Reviewed: I have personally reviewed following labs and imaging studies  CBC: Recent Labs  Lab 07/18/19 1019 07/19/19 0408 07/20/19 0454 07/21/19 0559  WBC 11.8* 12.9* 9.5 11.8*  NEUTROABS  --   --  8.1*  --   HGB 14.0 13.6 12.1 11.3*  HCT 43.3 45.0 40.5 36.1  MCV 88.2 93.4 94.4 91.6    PLT 280 253 225 [id number]   Basic Metabolic Panel: Recent Labs  Lab 07/18/19 1019 07/19/19 0408 07/20/19 0454 07/21/19 0559  NA 137 139 140 138  K 3.4* 4.4 3.4* 3.1*  CL 101 107 110 105  CO2 26 21* 22 24  GLUCOSE 101* 131* 185* 110*  BUN 9 7 10 12   CREATININE 0.66 0.86 0.73 0.63  CALCIUM 10.6* 8.9 8.3* 8.4*  MG  --   --  2.0 1.9  PHOS  --   --  3.3  --    GFR: Estimated Creatinine Clearance: 87.3 mL/min (by C-G formula based on SCr of 0.63 mg/dL). Liver Function Tests: Recent Labs  Lab 07/18/19 1019 07/19/19 0408 07/20/19 0454 07/21/19 0559  AST 19 33 60* 35  ALT 18 22 55* 45*  ALKPHOS 101 98 89 79  BILITOT 0.5 0.7 0.4 0.9  PROT 7.6 7.4 6.7 6.3*  ALBUMIN 4.1 3.8 3.4* 3.3*   Recent Labs  Lab 07/18/19 1019 07/19/19 0408  LIPASE 88* 41   No results for input(s): AMMONIA in the last 168 hours. Coagulation Profile: Recent Labs  Lab 07/18/19 1019  INR 1.0   Cardiac Enzymes: No results for input(s): CKTOTAL, CKMB, CKMBINDEX, TROPONINI in the last 168 hours. BNP (last 3 results) No results for input(s): PROBNP in the last 8760 hours. HbA1C: Recent Labs    07/20/19 0454  HGBA1C 5.7*   CBG: No results for input(s): GLUCAP in the last 168 hours. Lipid Profile: Recent Labs    07/19/19 0408  TRIG 180*   Thyroid Function Tests: No results for input(s): TSH, T4TOTAL, FREET4, T3FREE, THYROIDAB in the last 72 hours. Anemia Panel: No results for input(s): VITAMINB12, FOLATE, FERRITIN, TIBC, IRON, RETICCTPCT in the last 72 hours. Sepsis Labs: No results for input(s): PROCALCITON, LATICACIDVEN in the last 168 hours.  Recent Results (from the past 240 hour(s))  SARS CORONAVIRUS 2 (TAT 6-24 HRS) Nasopharyngeal Nasopharyngeal Swab     Status: None   Collection Time: 07/18/19  2:50 PM   Specimen: Nasopharyngeal Swab  Result Value Ref Range Status   SARS Coronavirus 2 NEGATIVE NEGATIVE Final    Comment: (NOTE) SARS-CoV-2 target nucleic acids are NOT DETECTED. The  SARS-CoV-2 RNA is generally detectable in upper and lower respiratory specimens during the acute phase of infection. Negative results do not preclude SARS-CoV-2 infection, do not rule out co-infections with other pathogens, and should not be used as the sole basis for treatment or other patient management decisions. Negative results must be combined with clinical observations, patient history, and epidemiological information. The expected result is Negative. Fact Sheet for Patients: SugarRoll.be Fact Sheet for Healthcare Providers: https://www.woods-mathews.com/ This test is not yet approved or cleared by the [place] FDA and  has been authorized for detection and/or diagnosis of SARS-CoV-2 by FDA under an Emergency Use Authorization (EUA). This EUA will remain  in effect (meaning this test can be used) for the duration of the COVID-19 declaration under Section 56 4(b)(1) of the Act, 21 U.S.C. section 360bbb-3(b)(1), unless the authorization is terminated or revoked sooner. Performed at [facility], [place] [address], [place], [place] [address]          Radiology Studies: Mr 3d Recon At Scanner  Result Date: 07/20/2019 CLINICAL DATA:  Acute pancreatitis.  First episode.  Abdominal pain. EXAM: MRI ABDOMEN WITH CONTRAST (WITH MRCP) TECHNIQUE: Multiplanar multisequence MR imaging of the abdomen was performed following the administration of intravenous contrast. Heavily T2-weighted images of the biliary and pancreatic ducts were obtained, and three-dimensional MRCP images were rendered by post processing. CONTRAST:  73mL GADAVIST GADOBUTROL 1 MMOL/ML IV SOLN COMPARISON:  07/18/2019 abdominopelvic CT. FINDINGS: Lower chest: Interval development of small bilateral pleural effusions, larger on the right. New septal thickening, most consistent with pulmonary edema. Bilateral breast implants which are collapsed but incompletely imaged.  Hepatobiliary: Multiple hepatic cysts, including a dominant anterior right hepatic lobe lesion of 8.1 cm. Cholecystectomy.  No choledocholithiasis or biliary duct dilatation. Pancreas: mild residual peripancreatic edema adjacent the tail, including on 37/3. Felt to be relatively similar. A cystic lesion within the pancreatic head measures 1.1 cm on 35/3. No suspicious postcontrast characteristics, including on 56/1102. No peripancreatic fluid collection. No pancreatic necrosis. No pancreas divisum. Spleen:  Normal in size, without focal abnormality. Adrenals/Urinary Tract: 1.2 cm left adrenal nodule demonstrates signal dropout on out of phase  imaging, consistent with an adenoma. Normal kidneys, without hydronephrosis. Stomach/Bowel: Normal stomach and abdominal bowel loops. Vascular/Lymphatic: Aortic atherosclerosis. Patent portal, splenic, hepatic veins. No abdominal adenopathy. Other:  No significant free fluid. Musculoskeletal: Suboptimally evaluated left-sided lumbar spine fixation. Mild anasarca. IMPRESSION: 1. Similar mild non complicated pancreatitis. No evidence of choledocholithiasis, pancreas divisum, or other cause identified. 2. Development of congestive heart failure since the prior CT of 07/18/2019. 3. Right adrenal adenoma. 4.  Aortic Atherosclerosis (ICD10-I70.0). 5. Chronic cystic lesion within the pancreatic head, favoring pseudocyst. Electronically Signed   By: Abigail Miyamoto M.D.   On: 07/20/2019 19:51   Dg Chest Port 1 View  Result Date: 07/20/2019 CLINICAL DATA:  Hypoxia. EXAM: PORTABLE CHEST 1 VIEW COMPARISON:  Chest radiograph earlier this day. Lung bases from abdominal CT 07/18/2019 FINDINGS: Septal thickening and Kerley B-lines consistent with pulmonary edema, increased from prior exam. New superimposed patchy opacity in the right mid lung. Unchanged cardiomegaly. Small pleural effusions are seen on MRI earlier this day. No pneumothorax. Surgical hardware in the lower cervical spine is  partially included. IMPRESSION: 1. Worsening pulmonary edema since earlier this day. Similar cardiomegaly. 2. Patchy opacity in the right mid lung may be confluent edema versus pneumonia/aspiration. Electronically Signed   By: Keith Rake M.D.   On: 07/20/2019 20:44   Dg Chest Port 1 View  Result Date: 07/20/2019 CLINICAL DATA:  Wheezing with abdominal pain and nausea. EXAM: PORTABLE CHEST 1 VIEW COMPARISON:  09/06/2018 FINDINGS: Lungs are somewhat hypoinflated with mild hazy prominence of the perihilar markings suggesting mild vascular congestion. No focal lobar consolidation or effusion. Cardiomediastinal silhouette and remainder of the exam is unchanged. IMPRESSION: Hypoinflation with suggestion of mild vascular congestion. Electronically Signed   By: Marin Olp M.D.   On: 07/20/2019 13:26   Mr Abdomen With Mrcp W Contrast  Result Date: 07/20/2019 CLINICAL DATA:  Acute pancreatitis.  First episode.  Abdominal pain. EXAM: MRI ABDOMEN WITH CONTRAST (WITH MRCP) TECHNIQUE: Multiplanar multisequence MR imaging of the abdomen was performed following the administration of intravenous contrast. Heavily T2-weighted images of the biliary and pancreatic ducts were obtained, and three-dimensional MRCP images were rendered by post processing. CONTRAST:  23mL GADAVIST GADOBUTROL 1 MMOL/ML IV SOLN COMPARISON:  07/18/2019 abdominopelvic CT. FINDINGS: Lower chest: Interval development of small bilateral pleural effusions, larger on the right. New septal thickening, most consistent with pulmonary edema. Bilateral breast implants which are collapsed but incompletely imaged. Hepatobiliary: Multiple hepatic cysts, including a dominant anterior right hepatic lobe lesion of 8.1 cm. Cholecystectomy.  No choledocholithiasis or biliary duct dilatation. Pancreas: mild residual peripancreatic edema adjacent the tail, including on 37/3. Felt to be relatively similar. A cystic lesion within the pancreatic head measures 1.1  cm on 35/3. No suspicious postcontrast characteristics, including on 56/1102. No peripancreatic fluid collection. No pancreatic necrosis. No pancreas divisum. Spleen:  Normal in size, without focal abnormality. Adrenals/Urinary Tract: 1.2 cm left adrenal nodule demonstrates signal dropout on out of phase imaging, consistent with an adenoma. Normal kidneys, without hydronephrosis. Stomach/Bowel: Normal stomach and abdominal bowel loops. Vascular/Lymphatic: Aortic atherosclerosis. Patent portal, splenic, hepatic veins. No abdominal adenopathy. Other:  No significant free fluid. Musculoskeletal: Suboptimally evaluated left-sided lumbar spine fixation. Mild anasarca. IMPRESSION: 1. Similar mild non complicated pancreatitis. No evidence of choledocholithiasis, pancreas divisum, or other cause identified. 2. Development of congestive heart failure since the prior CT of 07/18/2019. 3. Right adrenal adenoma. 4.  Aortic Atherosclerosis (ICD10-I70.0). 5. Chronic cystic lesion within the pancreatic head, favoring pseudocyst. Electronically Signed  By: Abigail Miyamoto M.D.   On: 07/20/2019 19:51        Scheduled Meds:  DULoxetine  90 mg Oral QHS   enoxaparin (LOVENOX) injection  40 mg Subcutaneous Q24H   fluticasone  2 spray Each Nare Daily   furosemide  40 mg Intravenous Once   losartan  50 mg Oral Daily   mupirocin ointment   Topical Daily   pantoprazole  40 mg Oral Q0600   silver sulfADIAZINE  1 application Topical Daily   Continuous Infusions:    LOS: 2 days    Time spent: 40 minutes with over 50% of the time coordinating the patient's care    Harold Hedge, DO [facility] Pager [phone]  If 7PM-7AM, please contact night-coverage [email or web address] Password [facility] 07/21/2019, 4:52 PM

## 2019-07-21 NOTE — Progress Notes (Signed)
  Echocardiogram 2D Echocardiogram has been performed.  Melissa Allen 07/21/2019, 2:03 PM

## 2019-07-22 ENCOUNTER — Inpatient Hospital Stay (HOSPITAL_COMMUNITY): Payer: BC Managed Care – PPO

## 2019-07-22 DIAGNOSIS — J9621 Acute and chronic respiratory failure with hypoxia: Secondary | ICD-10-CM

## 2019-07-22 LAB — BASIC METABOLIC PANEL
Anion gap: 12 (ref 5–15)
BUN: 10 mg/dL (ref 6–20)
CO2: 27 mmol/L (ref 22–32)
Calcium: 8.3 mg/dL — ABNORMAL LOW (ref 8.9–10.3)
Chloride: 98 mmol/L (ref 98–111)
Creatinine, Ser: 0.48 mg/dL (ref 0.44–1.00)
GFR calc Af Amer: >60 mL/min (ref >60)
GFR calc non Af Amer: >60 mL/min (ref >60)
Glucose, Bld: 95 mg/dL (ref 70–99)
Potassium: 3 mmol/L — ABNORMAL LOW (ref 3.5–5.1)
Sodium: 137 mmol/L (ref 135–145)

## 2019-07-22 LAB — CBC
HCT: 36.5 % (ref 36.0–46.0)
Hemoglobin: 11.6 g/dL — ABNORMAL LOW (ref 12.0–15.0)
MCH: 28.5 pg (ref 26.0–34.0)
MCHC: 31.8 g/dL (ref 30.0–36.0)
MCV: 89.7 fL (ref 80.0–100.0)
Platelets: 170 10*3/uL (ref 150–400)
RBC: 4.07 MIL/uL (ref 3.87–5.11)
RDW: 13 % (ref 11.5–15.5)
WBC: 9 10*3/uL (ref 4.0–10.5)
nRBC: 0 % (ref 0.0–0.2)

## 2019-07-22 LAB — MAGNESIUM: Magnesium: 1.8 mg/dL (ref 1.7–2.4)

## 2019-07-22 MED ORDER — FUROSEMIDE 10 MG/ML IJ SOLN: 20.0000 mg | Freq: Once | INTRAMUSCULAR | Status: DC

## 2019-07-22 MED ORDER — FUROSEMIDE 10 MG/ML IJ SOLN
20.0000 mg | Freq: Once | INTRAMUSCULAR | Status: DC
  Filled 2019-07-22: qty 2

## 2019-07-22 MED ORDER — CARVEDILOL 3.125 MG PO TABS
3.1250 mg | ORAL_TABLET | Freq: Two times a day (BID) | ORAL | Status: DC
  Administered 2019-07-22 – 2019-07-24 (×4): 3.125 mg via ORAL
  Filled 2019-07-22 (×5): qty 1

## 2019-07-22 MED ORDER — POTASSIUM CHLORIDE 10 MEQ/100ML IV SOLN
10.0000 meq | INTRAVENOUS | Status: AC
  Administered 2019-07-22 (×2): 10 meq via INTRAVENOUS
  Filled 2019-07-22 (×2): qty 100

## 2019-07-22 MED ORDER — POTASSIUM CHLORIDE CRYS ER 20 MEQ PO TBCR
40.0000 meq | EXTENDED_RELEASE_TABLET | Freq: Once | ORAL | Status: AC
  Administered 2019-07-22: 40 meq via ORAL
  Filled 2019-07-22: qty 2

## 2019-07-22 NOTE — Progress Notes (Addendum)
Daily Rounding Note  07/22/2019, 9:10 AM  LOS: 3 days   SUBJECTIVE:   Chief complaint: acute pancreatitis. Pancreatic cyst. Hepatic cysts.    Several BM's last PM after Dulcolax po.  Still has nausea and worsening left sided abd pain after clear po.   Says her symptoms are about the same as they have been for the past couple of days.  Not sleeping well at night.  OBJECTIVE:         Vital signs in last 24 hours:    Temp:  [99.1 F (37.3 C)-99.5 F (37.5 C)] 99.1 F (37.3 C) (11/13 0401) Pulse Rate:  [87-100] 87 (11/13 0401) Resp:  [12-14] 14 (11/13 0401) BP: (139-140)/(81-84) 140/84 (11/13 0401) SpO2:  [80 %-100 %] 99 % (11/13 0401) Weight:  [89 kg] 89 kg (11/13 0401) Last BM Date: 07/21/19 Filed Weights   07/19/19 0921 07/22/19 0401  Weight: 89.9 kg 89 kg   General: Does not look ill or toxic.  Was sleeping when I walked in the room but easy to wake her up. Heart: RRR. Chest: Clear bilaterally.  No labored breathing, no cough. Abdomen: Soft.  Tender on the left.  No guarding or rebound. Extremities: No CCE. Neuro/Psych: Alert.  Oriented x3.  No tremors, no limb weakness.  Calm, cooperative.  Intake/Output from previous day: 11/12 0701 - 11/13 0700 In: 740 [P.O.:340; IV Piggyback:400] Out: [id number] [Urine:4050]  Intake/Output this shift: No intake/output data recorded.  Lab Results: Recent Labs    07/20/19 0454 07/21/19 0559 07/22/19 0434  WBC 9.5 11.8* 9.0  HGB 12.1 11.3* 11.6*  HCT 40.5 36.1 36.5  PLT 225 227 170   BMET Recent Labs    07/20/19 0454 07/21/19 0559 07/22/19 0434  NA 140 138 137  K 3.4* 3.1* 3.0*  CL 110 105 98  CO2 22 24 27   GLUCOSE 185* 110* 95  BUN 10 12 10   CREATININE 0.73 0.63 0.48  CALCIUM 8.3* 8.4* 8.3*   LFT Recent Labs    07/20/19 0454 07/21/19 0559  PROT 6.7 6.3*  ALBUMIN 3.4* 3.3*  AST 60* 35  ALT 55* 45*  ALKPHOS 89 79  BILITOT 0.4 0.9   PT/INR No  results for input(s): LABPROT, INR in the last 72 hours. Hepatitis Panel No results for input(s): HEPBSAG, HCVAB, HEPAIGM, HEPBIGM in the last 72 hours.  Studies/Results:  Dg Chest Port 1 View  Result Date: 07/21/2019 CLINICAL DATA:  Desaturations. EXAM: PORTABLE CHEST 1 VIEW COMPARISON:  07/20/2019. FINDINGS: Mild cardiac enlargement. Persistent mild to moderate pulmonary edema. Asymmetric opacification in the right lower lobe is noted which may reflect asymmetric edema or superimposed pneumonia. IMPRESSION: 1. Cardiac enlargement and pulmonary edema. 2. Persistent right lower lobe airspace opacity which may reflect asymmetric edema or pneumonia. Electronically Signed   By: Kerby Moors M.D.   On: 07/21/2019 17:14   Dg Chest Port 1 View  Result Date: 07/20/2019 CLINICAL DATA:  Hypoxia. EXAM: PORTABLE CHEST 1 VIEW COMPARISON:  Chest radiograph earlier this day. Lung bases from abdominal CT 07/18/2019 FINDINGS: Septal thickening and Kerley B-lines consistent with pulmonary edema, increased from prior exam. New superimposed patchy opacity in the right mid lung. Unchanged cardiomegaly. Small pleural effusions are seen on MRI earlier this day. No pneumothorax. Surgical hardware in the lower cervical spine is partially included. IMPRESSION: 1. Worsening pulmonary edema since earlier this day. Similar cardiomegaly. 2. Patchy opacity in the right mid lung may be confluent edema  versus pneumonia/aspiration. Electronically Signed   By: Keith Rake M.D.   On: 07/20/2019 20:44   Dg Chest Port 1 View  Result Date: 07/20/2019 CLINICAL DATA:  Wheezing with abdominal pain and nausea. EXAM: PORTABLE CHEST 1 VIEW COMPARISON:  09/06/2018 FINDINGS: Lungs are somewhat hypoinflated with mild hazy prominence of the perihilar markings suggesting mild vascular congestion. No focal lobar consolidation or effusion. Cardiomediastinal silhouette and remainder of the exam is unchanged. IMPRESSION: Hypoinflation with  suggestion of mild vascular congestion. Electronically Signed   By: Marin Olp M.D.   On: 07/20/2019 13:26   Mr Abdomen With Mrcp W Contrast Mr 3d Recon At Scanner  Result Date: 07/20/2019 CLINICAL DATA:  Acute pancreatitis.  First episode.  Abdominal pain. EXAM: MRI ABDOMEN WITH CONTRAST (WITH MRCP) TECHNIQUE: Multiplanar multisequence MR imaging of the abdomen was performed following the administration of intravenous contrast. Heavily T2-weighted images of the biliary and pancreatic ducts were obtained, and three-dimensional MRCP images were rendered by post processing. CONTRAST:  22mL GADAVIST GADOBUTROL 1 MMOL/ML IV SOLN COMPARISON:  07/18/2019 abdominopelvic CT. FINDINGS: Lower chest: Interval development of small bilateral pleural effusions, larger on the right. New septal thickening, most consistent with pulmonary edema. Bilateral breast implants which are collapsed but incompletely imaged. Hepatobiliary: Multiple hepatic cysts, including a dominant anterior right hepatic lobe lesion of 8.1 cm. Cholecystectomy.  No choledocholithiasis or biliary duct dilatation. Pancreas: mild residual peripancreatic edema adjacent the tail, including on 37/3. Felt to be relatively similar. A cystic lesion within the pancreatic head measures 1.1 cm on 35/3. No suspicious postcontrast characteristics, including on 56/1102. No peripancreatic fluid collection. No pancreatic necrosis. No pancreas divisum. Spleen:  Normal in size, without focal abnormality. Adrenals/Urinary Tract: 1.2 cm left adrenal nodule demonstrates signal dropout on out of phase imaging, consistent with an adenoma. Normal kidneys, without hydronephrosis. Stomach/Bowel: Normal stomach and abdominal bowel loops. Vascular/Lymphatic: Aortic atherosclerosis. Patent portal, splenic, hepatic veins. No abdominal adenopathy. Other:  No significant free fluid. Musculoskeletal: Suboptimally evaluated left-sided lumbar spine fixation. Mild anasarca. IMPRESSION:  1. Similar mild non complicated pancreatitis. No evidence of choledocholithiasis, pancreas divisum, or other cause identified. 2. Development of congestive heart failure since the prior CT of 07/18/2019. 3. Right adrenal adenoma. 4.  Aortic Atherosclerosis (ICD10-I70.0). 5. Chronic cystic lesion within the pancreatic head, favoring pseudocyst. Electronically Signed   By: Abigail Miyamoto M.D.   On: 07/20/2019 19:51    Scheduled Meds:  DULoxetine  90 mg Oral QHS   enoxaparin (LOVENOX) injection  40 mg Subcutaneous Q24H   fluticasone  2 spray Each Nare Daily   losartan  50 mg Oral Daily   mupirocin ointment   Topical Daily   pantoprazole  40 mg Oral Q0600   silver sulfADIAZINE  1 application Topical Daily   Continuous Infusions:  potassium chloride 10 mEq (07/22/19 0824)   PRN Meds:.acetaminophen **OR** acetaminophen, albuterol, cyclobenzaprine, diphenhydrAMINE, hydrALAZINE, HYDROmorphone (DILAUDID) injection, ketorolac, LORazepam, ondansetron **OR** ondansetron (ZOFRAN) IV, senna-docusate  Scheduled Meds:  DULoxetine  90 mg Oral QHS   enoxaparin (LOVENOX) injection  40 mg Subcutaneous Q24H   fluticasone  2 spray Each Nare Daily   losartan  50 mg Oral Daily   mupirocin ointment   Topical Daily   pantoprazole  40 mg Oral Q0600   silver sulfADIAZINE  1 application Topical Daily   Continuous Infusions:  potassium chloride 10 mEq (07/22/19 1023)   PRN Meds:.acetaminophen **OR** acetaminophen, albuterol, cyclobenzaprine, diphenhydrAMINE, hydrALAZINE, HYDROmorphone (DILAUDID) injection, ketorolac, LORazepam, ondansetron **OR** ondansetron (ZOFRAN) IV, senna-docusate  ASSESMENT:   *   Acute, idiopathic pancreatitis. Hx same several years ago.  Cholecystectomy late 52s.  Pancreatitis involves tail, w 1.1 cm cystic lesion at [facility], favoring pseudocyst.  On MRCP the pancreatitis is mild w/o anatomic irregularities of pancreas. On Maxzide PTA, though taking this > 10 yrs could the  HCTZ have caused pancreatitis? Fluctuating, new, minor elevation of transaminases. WBCs 12.9  >> 9.5 >> 11.3 >> 9.  *    Hepatic cysts.  One dominant, 8.1 cm cyst in right lobe.  *    Chronic pain, chronic narcotics.  *    Hypokalemia. K level 3.1 >> 3 despite 4 runs potassium yesterday.  2 runs plus 40 meq po ordered this AM  *   New CHF in setting of aggressive IV fluids.  Improved after IV Lasix, d/c IVF.   2 D echo11/08/2019: LVEF 55 to 60%.  Indeterminate left ventricular diastolic parameters.  Right ventricular pressures normal.  Minimal if any valvular abnormalities or regurgitation.  Trace pericardial effusion.   PLAN   *   KUB to assess for ileus and/or causes for persistent symptoms in setting of mild pancreatitis. Leave clear liquid diet in place.  *   After discharge, assigned GI MD will be Dr Henrene Pastor.  However, if EUS pursued will need to meet w Dr Ardis Hughs or Sims.     Azucena Freed  07/22/2019, 9:10 AM Phone [phone]  GI ATTENDING  Interval history and data reviewed.  Patient personally seen and examined.  Agree with interval progress note as outlined above.  Still with some pain but looks good.  I have encouraged her to ambulate and advance diet as tolerated.  Hopefully she will be ready for discharge when she feels like her pain is under adequate control when she is able to take adequate nutrition.  I did advise her to avoid any alcohol and have a strict low-fat diet.  We would like to see her back in our office in about 4 weeks.  We will sign off for now, but contact us if there are any questions or problems.  Docia Chuck. Geri Seminole., M.D. [facility] Division of Gastroenterology

## 2019-07-22 NOTE — Progress Notes (Signed)
PROGRESS NOTE    Melissa Allen    Code Status: Full Code  [id number] DOB: 1966/07/31 DOA: 07/18/2019  PCP: Lucille Passy, MD    Hospital Summary  This is a 53 year old female with past medical history of hypertension, fibromyalgia, multiple back surgeries on pain management, history of pancreatitis, cholecystectomy and appendectomy who presented with severe abdominal pain found to have pancreatitis on admission via CT abdomen pelvis and elevated lipase.  GI has been consulted.  11/11-11/12 overnight: Patient noted to be hypoxic and volume overloaded.  She was given Lasix 40 mg IV x1 and echo was ordered.  11/12 daytime: Patient regarding hypoxia again 70s to 80s, placed on 10 L nasal cannula..  At bedside patient with crackles and wheezes on exam.  Chest x-ray ordered.  Final read of echo not available yet.  Given Lasix and nebulizer treatment  A & P   Principal Problem:   Acute pancreatitis Active Problems:   Anxiety state   Depression, recurrent (HCC)   Essential hypertension   Asthma   GERD   Hepatic cyst   Pancreatitis   Non-intractable vomiting   Acute on chronic respiratory failure with hypoxia (HCC)   Volume overload   SIRS secondary to acute on chronic pancreatitis, idiopathic etiology improving history of cholecystectomy.  Has 15 mm cystic lesion along the posterior pancreatic head. GI noted mild inflammation of the pancreatic tail and minimal elevation of lipase with a family history of pancreatic cancer.  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  Still not tolerating much p.o. intake.  GI ordered KUB which was negative for obstruction/ileus -Supportive care  -Continue antiemetics -Advance diet slowly and as tolerated -GI signed off.  Patient will need to follow-up in GI office in 4 weeks  Acute hypoxic respiratory failure secondary to volume overload improving.  O2 decreased to 2 L  nasal cannula at bedside and patient satting well without any conversational dyspnea.  Echo yesterday EF 55 to 60% -Hold IV fluids -Lasix 20 mg IV x1 -Heart failure protocol  Large hepatic cyst with multiple small hepatic cysts -Per GI: May consider future referral to hepatology center once pancreatitis is resolved  Chronic pain syndrome on Opana at home-on hold since admission.  Frequently getting IV Dilaudid and Toradol in setting of acute pain as above.  Home Cymbalta and Flexeril has been on hold as she was n.p.o. on admission, restarted 11/12.  She is status post PLIF L3-L4 with interbody fusion and posterior laminectomy at L4-L5 with mild degenerative changes of the visualized thoracolumbar spine -Monitor  Asthma stable -Continue as needed inhaler   Hypertension -Continue losartan  Tension headache improved today -continue as needed Tylenol -Continue Cymbalta and Flexeril -Getting as needed Toradol and Dilaudid as well  Obesity BMI 34 -Patient was given also dietary counseling  Hypercalcemia resolved with IV fluids  Hyperglycemia patient has been getting D5 with lactated Ringer -Discontinue fluids  Metabolic acidosis resolved  Hypokalemia from diuresis -Replete   DVT prophylaxis: Lovenox Family Communication: Discussed with husband over the phone Yesterday Disposition Plan: Pending clinical stability.  May be able to be discharged in the next 1 to 2 days she can tolerate advancement of diet  Consultants  GI  Procedures  MRCP 11/11  Antibiotics  None      Subjective   And examined lying flat today resting comfortably.  No conversational dyspnea.  States that her shortness of breath is improved but still has  increased work of breathing on exertion.  Oxygen was decreased during our encounter and patient remained in mid to high 90s while on 2 L nasal cannula.  Patient states that she tried a clear liquid diet this morning but was unable to tolerate Jell-O with  abdominal pain noted.  Otherwise remaining review of systems negative.  Objective   Vitals:   07/21/19 1808 07/21/19 2156 07/22/19 0401 07/22/19 1248  BP:  139/81 140/84 129/84  Pulse:  100 87 83  Resp:  12 14 19   Temp:  99.5 F (37.5 C) 99.1 F (37.3 C) 98.3 F (36.8 C)  TempSrc:  Oral Oral Oral  SpO2: 94% 100% 99% 94%  Weight:   89 kg   Height:        Intake/Output Summary (Last 24 hours) at 07/22/2019 1831 Last data filed at 07/22/2019 1627 Gross per 24 hour  Intake 420 ml  Output 2400 ml  Net -1980 ml   Filed Weights   07/19/19 0921 07/22/19 0401  Weight: 89.9 kg 89 kg    Examination:  Physical Exam Vitals signs and nursing note reviewed.  Constitutional:      Appearance: Normal appearance.  HENT:     Head: Normocephalic and atraumatic.     Nose: Nose normal.     Mouth/Throat:     Mouth: Mucous membranes are moist.  Eyes:     Extraocular Movements: Extraocular movements intact.  Neck:     Musculoskeletal: Normal range of motion. No neck rigidity.  Cardiovascular:     Rate and Rhythm: Normal rate and regular rhythm.  Pulmonary:     Effort: Pulmonary effort is normal. No respiratory distress.     Breath sounds: Normal breath sounds.  Abdominal:     General: Abdomen is flat. There is no distension.     Palpations: Abdomen is soft.     Tenderness: There is no abdominal tenderness.  Musculoskeletal: Normal range of motion.        General: No swelling.  Neurological:     General: No focal deficit present.     Mental Status: She is alert. Mental status is at baseline.  Psychiatric:        Mood and Affect: Mood normal.        Behavior: Behavior normal.     Data Reviewed: I have personally reviewed following labs and imaging studies  CBC: Recent Labs  Lab 07/18/19 1019 07/19/19 0408 07/20/19 0454 07/21/19 0559 07/22/19 0434  WBC 11.8* 12.9* 9.5 11.8* 9.0  NEUTROABS  --   --  8.1*  --   --   HGB 14.0 13.6 12.1 11.3* 11.6*  HCT 43.3 45.0 40.5  36.1 36.5  MCV 88.2 93.4 94.4 91.6 89.7  PLT 280 253 225 227 [id number]   Basic Metabolic Panel: Recent Labs  Lab 07/18/19 1019 07/19/19 0408 07/20/19 0454 07/21/19 0559 07/22/19 0434  NA 137 139 140 138 137  K 3.4* 4.4 3.4* 3.1* 3.0*  CL 101 107 110 105 98  CO2 26 21* 22 24 27   GLUCOSE 101* 131* 185* 110* 95  BUN 9 7 10 12 10   CREATININE 0.66 0.86 0.73 0.63 0.48  CALCIUM 10.6* 8.9 8.3* 8.4* 8.3*  MG  --   --  2.0 1.9 1.8  PHOS  --   --  3.3  --   --    GFR: Estimated Creatinine Clearance: 86.8 mL/min (by C-G formula based on SCr of 0.48 mg/dL). Liver Function Tests: Recent Labs  Lab 07/18/19  1019 07/19/19 0408 07/20/19 0454 07/21/19 0559  AST 19 33 60* 35  ALT 18 22 55* 45*  ALKPHOS 101 98 89 79  BILITOT 0.5 0.7 0.4 0.9  PROT 7.6 7.4 6.7 6.3*  ALBUMIN 4.1 3.8 3.4* 3.3*   Recent Labs  Lab 07/18/19 1019 07/19/19 0408  LIPASE 88* 41   No results for input(s): AMMONIA in the last 168 hours. Coagulation Profile: Recent Labs  Lab 07/18/19 1019  INR 1.0   Cardiac Enzymes: No results for input(s): CKTOTAL, CKMB, CKMBINDEX, TROPONINI in the last 168 hours. BNP (last 3 results) No results for input(s): PROBNP in the last 8760 hours. HbA1C: Recent Labs    07/20/19 0454  HGBA1C 5.7*   CBG: No results for input(s): GLUCAP in the last 168 hours. Lipid Profile: No results for input(s): CHOL, HDL, LDLCALC, TRIG, CHOLHDL, LDLDIRECT in the last 72 hours. Thyroid Function Tests: No results for input(s): TSH, T4TOTAL, FREET4, T3FREE, THYROIDAB in the last 72 hours. Anemia Panel: No results for input(s): VITAMINB12, FOLATE, FERRITIN, TIBC, IRON, RETICCTPCT in the last 72 hours. Sepsis Labs: No results for input(s): PROCALCITON, LATICACIDVEN in the last 168 hours.  Recent Results (from the past 240 hour(s))  SARS CORONAVIRUS 2 (TAT 6-24 HRS) Nasopharyngeal Nasopharyngeal Swab     Status: None   Collection Time: 07/18/19  2:50 PM   Specimen: Nasopharyngeal Swab  Result  Value Ref Range Status   SARS Coronavirus 2 NEGATIVE NEGATIVE Final    Comment: (NOTE) SARS-CoV-2 target nucleic acids are NOT DETECTED. The SARS-CoV-2 RNA is generally detectable in upper and lower respiratory specimens during the acute phase of infection. Negative results do not preclude SARS-CoV-2 infection, do not rule out co-infections with other pathogens, and should not be used as the sole basis for treatment or other patient management decisions. Negative results must be combined with clinical observations, patient history, and epidemiological information. The expected result is Negative. Fact Sheet for Patients: SugarRoll.be Fact Sheet for Healthcare Providers: https://www.woods-mathews.com/ This test is not yet approved or cleared by the [place] FDA and  has been authorized for detection and/or diagnosis of SARS-CoV-2 by FDA under an Emergency Use Authorization (EUA). This EUA will remain  in effect (meaning this test can be used) for the duration of the COVID-19 declaration under Section 56 4(b)(1) of the Act, 21 U.S.C. section 360bbb-3(b)(1), unless the authorization is terminated or revoked sooner. Performed at [facility], [place] [address] [place], [place] [address]          Radiology Studies: Dg Abd 1 View  Result Date: 07/22/2019 CLINICAL DATA:  Follow-up ileus. Nausea and abdominal pain. History of pancreatitis. EXAM: ABDOMEN - 1 VIEW COMPARISON:  MRI abdomen 07/20/2011 FINDINGS: The bowel gas pattern is unremarkable. There is some minimal scattered air throughout the colon. No air distended small bowel loops to suggest obstruction. No free air. The soft tissue shadows are maintained. Stable surgical changes related to lumbar fusions and a cholecystectomy. The bony structures are intact. IMPRESSION: Unremarkable bowel gas pattern. No findings suspicious for obstruction, perforation or ileus. Electronically  Signed   By: Marijo Sanes M.D.   On: 07/22/2019 13:22   Mr 3d Recon At Scanner  Result Date: 07/20/2019 CLINICAL DATA:  Acute pancreatitis.  First episode.  Abdominal pain. EXAM: MRI ABDOMEN WITH CONTRAST (WITH MRCP) TECHNIQUE: Multiplanar multisequence MR imaging of the abdomen was performed following the administration of intravenous contrast. Heavily T2-weighted images of the biliary and pancreatic ducts were obtained, and  three-dimensional MRCP images were rendered by post processing. CONTRAST:  41mL GADAVIST GADOBUTROL 1 MMOL/ML IV SOLN COMPARISON:  07/18/2019 abdominopelvic CT. FINDINGS: Lower chest: Interval development of small bilateral pleural effusions, larger on the right. New septal thickening, most consistent with pulmonary edema. Bilateral breast implants which are collapsed but incompletely imaged. Hepatobiliary: Multiple hepatic cysts, including a dominant anterior right hepatic lobe lesion of 8.1 cm. Cholecystectomy.  No choledocholithiasis or biliary duct dilatation. Pancreas: mild residual peripancreatic edema adjacent the tail, including on 37/3. Felt to be relatively similar. A cystic lesion within the pancreatic head measures 1.1 cm on 35/3. No suspicious postcontrast characteristics, including on 56/1102. No peripancreatic fluid collection. No pancreatic necrosis. No pancreas divisum. Spleen:  Normal in size, without focal abnormality. Adrenals/Urinary Tract: 1.2 cm left adrenal nodule demonstrates signal dropout on out of phase imaging, consistent with an adenoma. Normal kidneys, without hydronephrosis. Stomach/Bowel: Normal stomach and abdominal bowel loops. Vascular/Lymphatic: Aortic atherosclerosis. Patent portal, splenic, hepatic veins. No abdominal adenopathy. Other:  No significant free fluid. Musculoskeletal: Suboptimally evaluated left-sided lumbar spine fixation. Mild anasarca. IMPRESSION: 1. Similar mild non complicated pancreatitis. No evidence of choledocholithiasis,  pancreas divisum, or other cause identified. 2. Development of congestive heart failure since the prior CT of 07/18/2019. 3. Right adrenal adenoma. 4.  Aortic Atherosclerosis (ICD10-I70.0). 5. Chronic cystic lesion within the pancreatic head, favoring pseudocyst. Electronically Signed   By: Abigail Miyamoto M.D.   On: 07/20/2019 19:51   Dg Chest Port 1 View  Result Date: 07/21/2019 CLINICAL DATA:  Desaturations. EXAM: PORTABLE CHEST 1 VIEW COMPARISON:  07/20/2019. FINDINGS: Mild cardiac enlargement. Persistent mild to moderate pulmonary edema. Asymmetric opacification in the right lower lobe is noted which may reflect asymmetric edema or superimposed pneumonia. IMPRESSION: 1. Cardiac enlargement and pulmonary edema. 2. Persistent right lower lobe airspace opacity which may reflect asymmetric edema or pneumonia. Electronically Signed   By: Kerby Moors M.D.   On: 07/21/2019 17:14   Dg Chest Port 1 View  Result Date: 07/20/2019 CLINICAL DATA:  Hypoxia. EXAM: PORTABLE CHEST 1 VIEW COMPARISON:  Chest radiograph earlier this day. Lung bases from abdominal CT 07/18/2019 FINDINGS: Septal thickening and Kerley B-lines consistent with pulmonary edema, increased from prior exam. New superimposed patchy opacity in the right mid lung. Unchanged cardiomegaly. Small pleural effusions are seen on MRI earlier this day. No pneumothorax. Surgical hardware in the lower cervical spine is partially included. IMPRESSION: 1. Worsening pulmonary edema since earlier this day. Similar cardiomegaly. 2. Patchy opacity in the right mid lung may be confluent edema versus pneumonia/aspiration. Electronically Signed   By: Keith Rake M.D.   On: 07/20/2019 20:44   Mr Abdomen With Mrcp W Contrast  Result Date: 07/20/2019 CLINICAL DATA:  Acute pancreatitis.  First episode.  Abdominal pain. EXAM: MRI ABDOMEN WITH CONTRAST (WITH MRCP) TECHNIQUE: Multiplanar multisequence MR imaging of the abdomen was performed following the  administration of intravenous contrast. Heavily T2-weighted images of the biliary and pancreatic ducts were obtained, and three-dimensional MRCP images were rendered by post processing. CONTRAST:  15mL GADAVIST GADOBUTROL 1 MMOL/ML IV SOLN COMPARISON:  07/18/2019 abdominopelvic CT. FINDINGS: Lower chest: Interval development of small bilateral pleural effusions, larger on the right. New septal thickening, most consistent with pulmonary edema. Bilateral breast implants which are collapsed but incompletely imaged. Hepatobiliary: Multiple hepatic cysts, including a dominant anterior right hepatic lobe lesion of 8.1 cm. Cholecystectomy.  No choledocholithiasis or biliary duct dilatation. Pancreas: mild residual peripancreatic edema adjacent the tail, including on 37/3. Felt to  be relatively similar. A cystic lesion within the pancreatic head measures 1.1 cm on 35/3. No suspicious postcontrast characteristics, including on 56/1102. No peripancreatic fluid collection. No pancreatic necrosis. No pancreas divisum. Spleen:  Normal in size, without focal abnormality. Adrenals/Urinary Tract: 1.2 cm left adrenal nodule demonstrates signal dropout on out of phase imaging, consistent with an adenoma. Normal kidneys, without hydronephrosis. Stomach/Bowel: Normal stomach and abdominal bowel loops. Vascular/Lymphatic: Aortic atherosclerosis. Patent portal, splenic, hepatic veins. No abdominal adenopathy. Other:  No significant free fluid. Musculoskeletal: Suboptimally evaluated left-sided lumbar spine fixation. Mild anasarca. IMPRESSION: 1. Similar mild non complicated pancreatitis. No evidence of choledocholithiasis, pancreas divisum, or other cause identified. 2. Development of congestive heart failure since the prior CT of 07/18/2019. 3. Right adrenal adenoma. 4.  Aortic Atherosclerosis (ICD10-I70.0). 5. Chronic cystic lesion within the pancreatic head, favoring pseudocyst. Electronically Signed   By: Abigail Miyamoto M.D.   On:  07/20/2019 19:51        Scheduled Meds:  carvedilol  3.125 mg Oral BID WC   DULoxetine  90 mg Oral QHS   enoxaparin (LOVENOX) injection  40 mg Subcutaneous Q24H   fluticasone  2 spray Each Nare Daily   losartan  50 mg Oral Daily   mupirocin ointment   Topical Daily   pantoprazole  40 mg Oral Q0600   silver sulfADIAZINE  1 application Topical Daily   Continuous Infusions:    LOS: 3 days    Time spent: 25 minutes with over 50% of the time coordinating the patient's care    Harold Hedge, DO [facility] Pager [phone]  If 7PM-7AM, please contact night-coverage [email or web address] Password [facility] 07/22/2019, 6:31 PM

## 2019-07-23 LAB — COMPREHENSIVE METABOLIC PANEL
ALT: 39 U/L (ref 0–44)
AST: 27 U/L (ref 15–41)
Albumin: 3.5 g/dL (ref 3.5–5.0)
Alkaline Phosphatase: 87 U/L (ref 38–126)
Anion gap: 9 (ref 5–15)
BUN: 11 mg/dL (ref 6–20)
CO2: 29 mmol/L (ref 22–32)
Calcium: 9.3 mg/dL (ref 8.9–10.3)
Chloride: 101 mmol/L (ref 98–111)
Creatinine, Ser: 0.6 mg/dL (ref 0.44–1.00)
GFR calc Af Amer: >60 mL/min (ref >60)
GFR calc non Af Amer: >60 mL/min (ref >60)
Glucose, Bld: 91 mg/dL (ref 70–99)
Potassium: 3.6 mmol/L (ref 3.5–5.1)
Sodium: 139 mmol/L (ref 135–145)
Total Bilirubin: 0.5 mg/dL (ref 0.3–1.2)
Total Protein: 7 g/dL (ref 6.5–8.1)

## 2019-07-23 LAB — CBC
HCT: 44.1 % (ref 36.0–46.0)
Hemoglobin: 14.1 g/dL (ref 12.0–15.0)
MCH: 28.5 pg (ref 26.0–34.0)
MCHC: 32 g/dL (ref 30.0–36.0)
MCV: 89.1 fL (ref 80.0–100.0)
Platelets: 205 10*3/uL (ref 150–400)
RBC: 4.95 MIL/uL (ref 3.87–5.11)
RDW: 13.1 % (ref 11.5–15.5)
WBC: 6.8 10*3/uL (ref 4.0–10.5)
nRBC: 0.3 % — ABNORMAL HIGH (ref 0.0–0.2)

## 2019-07-23 LAB — MAGNESIUM: Magnesium: 1.8 mg/dL (ref 1.7–2.4)

## 2019-07-23 MED ORDER — OXYMORPHONE HCL 5 MG PO TABS: 5.0000 mg | ORAL_TABLET | Freq: Three times a day (TID) | ORAL | Status: DC | PRN

## 2019-07-23 NOTE — Progress Notes (Signed)
PROGRESS NOTE    Melissa Allen    Code Status: Full Code  [id number] DOB: Sep 13, 1965 DOA: 07/18/2019  PCP: Lucille Passy, MD    Hospital Summary  This is a 53 year old female with past medical history of hypertension, fibromyalgia, multiple back surgeries on pain management, history of pancreatitis, cholecystectomy and appendectomy who presented with severe abdominal pain found to have pancreatitis on admission via CT abdomen pelvis and elevated lipase.  GI has been consulted.  11/11-11/12 overnight: Patient noted to be hypoxic and volume overloaded.  She was given Lasix 40 mg IV x1 and echo was ordered.  11/12 daytime: Patient regarding hypoxia again 70s to 80s, placed on 10 L nasal cannula..  At bedside patient with crackles and wheezes on exam.  Chest x-ray ordered.  Final read of echo not available yet.  Given Lasix and nebulizer treatment  A & P   Principal Problem:   Acute pancreatitis Active Problems:   Anxiety state   Depression, recurrent (HCC)   Essential hypertension   Asthma   GERD   Hepatic cyst   Pancreatitis   Non-intractable vomiting   Acute on chronic respiratory failure with hypoxia (HCC)   Volume overload   SIRS secondary to acute on chronic pancreatitis, idiopathic etiology SIRS resolved.  Has 15 mm cystic lesion along the posterior pancreatic head. GI noted mild inflammation of the pancreatic tail and minimal elevation of lipase with a family history of pancreatic cancer.  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  Get a KUB for ileus. -Supportive care  -IV Dilaudid converted to Opana 5 mg every 8 hour as needed for severe pain which patient takes at home. -Advanced diet to full liquid diet this evening, if tolerates advance to soft in a.m. and possible discharge tomorrow. -GI signed off.  Patient will need to follow-up in GI office in 4 weeks  Acute hypoxic respiratory failure  secondary to volume overload echo EF 55 to 60%.  Resolved  -Heart failure protocol  Large hepatic cyst with multiple small hepatic cysts -Per GI: May consider future referral to hepatology center once pancreatitis is resolved  Chronic pain syndrome on Opana at home-on hold since admission. She is status post PLIF L3-L4 with interbody fusion and posterior laminectomy at L4-L5 with mild degenerative changes of the visualized thoracolumbar spine. -Change IV Dilaudid for Opana 5 mg every 8 hour as needed  Asthma stable -Continue as needed inhaler   Hypertension -Continue losartan  Tension headache improved  -continue as needed Tylenol -Continue Cymbalta and Flexeril -Getting as needed Toradol and Dilaudid as well  Obesity BMI 34 -Patient was given also dietary counseling  Hypercalcemia resolved with IV fluids  Hyperglycemia patient has been getting D5 with lactated Ringer -Discontinue fluids  Metabolic acidosis resolved  Hypokalemia from diuresis -Replete   DVT prophylaxis: Lovenox Family Communication: No family at bedside Disposition Plan: Pending clinical stability.  May be able to be discharged in the next 1 to 2 days she can tolerate advancement of diet  Consultants  GI  Procedures  MRCP 11/11  Antibiotics  None      Subjective   Patient seen and examined at bedside no acute distress lying flat and resting comfortably on room air.  States she was able to tolerate clear liquid diet for breakfast this morning and has had a bowel movement.  Continues to have minimal pain but much more tolerable than previous.  Denies nausea or vomiting.  Remaining 10 point ROS negative  Objective   Vitals:   07/23/19 0438 07/23/19 1140 07/23/19 1319 07/23/19 1510  BP: (!) 110/59 126/64 (!) 145/66 125/65  Pulse: 77 99 85 91  Resp: 18   12  Temp: 98.2 F (36.8 C)  98.3 F (36.8 C) 98.4 F (36.9 C)  TempSrc: Oral  Oral Oral  SpO2: (!) 87%  95% 93%  Weight:      Height:         Intake/Output Summary (Last 24 hours) at 07/23/2019 1803 Last data filed at 07/23/2019 1631 Gross per 24 hour  Intake 1560 ml  Output 1550 ml  Net 10 ml   Filed Weights   07/19/19 0921 07/22/19 0401  Weight: 89.9 kg 89 kg    Examination:  Physical Exam Vitals signs and nursing note reviewed.  Constitutional:      Appearance: Normal appearance.     Comments: On room air  HENT:     Head: Normocephalic and atraumatic.     Nose: Nose normal.     Mouth/Throat:     Mouth: Mucous membranes are moist.  Neck:     Musculoskeletal: Normal range of motion. No neck rigidity.  Cardiovascular:     Rate and Rhythm: Normal rate and regular rhythm.     Comments: No orthopnea Pulmonary:     Effort: Pulmonary effort is normal.     Breath sounds: Normal breath sounds.  Abdominal:     General: Abdomen is flat.     Comments: Mild abdominal tenderness  Musculoskeletal: Normal range of motion.        General: No swelling.  Skin:    Findings: No erythema or rash.  Neurological:     Mental Status: She is alert. Mental status is at baseline.  Psychiatric:        Mood and Affect: Mood normal.        Behavior: Behavior normal.     Data Reviewed: I have personally reviewed following labs and imaging studies  CBC: Recent Labs  Lab 07/19/19 0408 07/20/19 0454 07/21/19 0559 07/22/19 0434 07/23/19 0459  WBC 12.9* 9.5 11.8* 9.0 6.8  NEUTROABS  --  8.1*  --   --   --   HGB 13.6 12.1 11.3* 11.6* 14.1  HCT 45.0 40.5 36.1 36.5 44.1  MCV 93.4 94.4 91.6 89.7 89.1  PLT 253 225 227 170 [id number]   Basic Metabolic Panel: Recent Labs  Lab 07/19/19 0408 07/20/19 0454 07/21/19 0559 07/22/19 0434 07/23/19 0459  NA 139 140 138 137 139  K 4.4 3.4* 3.1* 3.0* 3.6  CL 107 110 105 98 101  CO2 21* 22 24 27 29   GLUCOSE 131* 185* 110* 95 91  BUN 7 10 12 10 11   CREATININE 0.86 0.73 0.63 0.48 0.60  CALCIUM 8.9 8.3* 8.4* 8.3* 9.3  MG  --  2.0 1.9 1.8 1.8  PHOS  --  3.3  --   --   --    GFR:  Estimated Creatinine Clearance: 86.8 mL/min (by C-G formula based on SCr of 0.6 mg/dL). Liver Function Tests: Recent Labs  Lab 07/18/19 1019 07/19/19 0408 07/20/19 0454 07/21/19 0559 07/23/19 0459  AST 19 33 60* 35 27  ALT 18 22 55* 45* 39  ALKPHOS 101 98 89 79 87  BILITOT 0.5 0.7 0.4 0.9 0.5  PROT 7.6 7.4 6.7 6.3* 7.0  ALBUMIN 4.1 3.8 3.4* 3.3* 3.5   Recent Labs  Lab 07/18/19 1019 07/19/19 0408  LIPASE 88*  41   No results for input(s): AMMONIA in the last 168 hours. Coagulation Profile: Recent Labs  Lab 07/18/19 1019  INR 1.0   Cardiac Enzymes: No results for input(s): CKTOTAL, CKMB, CKMBINDEX, TROPONINI in the last 168 hours. BNP (last 3 results) No results for input(s): PROBNP in the last 8760 hours. HbA1C: No results for input(s): HGBA1C in the last 72 hours. CBG: No results for input(s): GLUCAP in the last 168 hours. Lipid Profile: No results for input(s): CHOL, HDL, LDLCALC, TRIG, CHOLHDL, LDLDIRECT in the last 72 hours. Thyroid Function Tests: No results for input(s): TSH, T4TOTAL, FREET4, T3FREE, THYROIDAB in the last 72 hours. Anemia Panel: No results for input(s): VITAMINB12, FOLATE, FERRITIN, TIBC, IRON, RETICCTPCT in the last 72 hours. Sepsis Labs: No results for input(s): PROCALCITON, LATICACIDVEN in the last 168 hours.  Recent Results (from the past 240 hour(s))  SARS CORONAVIRUS 2 (TAT 6-24 HRS) Nasopharyngeal Nasopharyngeal Swab     Status: None   Collection Time: 07/18/19  2:50 PM   Specimen: Nasopharyngeal Swab  Result Value Ref Range Status   SARS Coronavirus 2 NEGATIVE NEGATIVE Final    Comment: (NOTE) SARS-CoV-2 target nucleic acids are NOT DETECTED. The SARS-CoV-2 RNA is generally detectable in upper and lower respiratory specimens during the acute phase of infection. Negative results do not preclude SARS-CoV-2 infection, do not rule out co-infections with other pathogens, and should not be used as the sole basis for treatment or other  patient management decisions. Negative results must be combined with clinical observations, patient history, and epidemiological information. The expected result is Negative. Fact Sheet for Patients: SugarRoll.be Fact Sheet for Healthcare Providers: https://www.woods-mathews.com/ This test is not yet approved or cleared by the [place] FDA and  has been authorized for detection and/or diagnosis of SARS-CoV-2 by FDA under an Emergency Use Authorization (EUA). This EUA will remain  in effect (meaning this test can be used) for the duration of the COVID-19 declaration under Section 56 4(b)(1) of the Act, 21 U.S.C. section 360bbb-3(b)(1), unless the authorization is terminated or revoked sooner. Performed at [facility], [place] [address], [place], [place] [address]          Radiology Studies: Dg Abd 1 View  Result Date: 07/22/2019 CLINICAL DATA:  Follow-up ileus. Nausea and abdominal pain. History of pancreatitis. EXAM: ABDOMEN - 1 VIEW COMPARISON:  MRI abdomen 07/20/2011 FINDINGS: The bowel gas pattern is unremarkable. There is some minimal scattered air throughout the colon. No air distended small bowel loops to suggest obstruction. No free air. The soft tissue shadows are maintained. Stable surgical changes related to lumbar fusions and a cholecystectomy. The bony structures are intact. IMPRESSION: Unremarkable bowel gas pattern. No findings suspicious for obstruction, perforation or ileus. Electronically Signed   By: Marijo Sanes M.D.   On: 07/22/2019 13:22        Scheduled Meds: . carvedilol  3.125 mg Oral BID WC  . DULoxetine  90 mg Oral QHS  . enoxaparin (LOVENOX) injection  40 mg Subcutaneous Q24H  . fluticasone  2 spray Each Nare Daily  . furosemide  20 mg Intravenous Once  . losartan  50 mg Oral Daily  . mupirocin ointment   Topical Daily  . pantoprazole  40 mg Oral Q0600  . silver sulfADIAZINE  1 application  Topical Daily   Continuous Infusions:    LOS: 4 days    Time spent: 26 minutes with over 50% of the time coordinating the patient's care  Harold Hedge, DO [facility] Pager [phone]  If 7PM-7AM, please contact night-coverage [email or web address] Password [facility] 07/23/2019, 6:03 PM

## 2019-07-24 LAB — CBC
HCT: 43.1 % (ref 36.0–46.0)
Hemoglobin: 14.3 g/dL (ref 12.0–15.0)
MCH: 28.4 pg (ref 26.0–34.0)
MCHC: 33.2 g/dL (ref 30.0–36.0)
MCV: 85.7 fL (ref 80.0–100.0)
Platelets: UNDETERMINED 10*3/uL (ref 150–400)
RBC: 5.03 MIL/uL (ref 3.87–5.11)
RDW: 13.2 % (ref 11.5–15.5)
WBC: 6.5 10*3/uL (ref 4.0–10.5)
nRBC: 0 % (ref 0.0–0.2)

## 2019-07-24 LAB — BASIC METABOLIC PANEL
Anion gap: 9 (ref 5–15)
BUN: 8 mg/dL (ref 6–20)
CO2: 26 mmol/L (ref 22–32)
Calcium: 9.2 mg/dL (ref 8.9–10.3)
Chloride: 103 mmol/L (ref 98–111)
Creatinine, Ser: 0.51 mg/dL (ref 0.44–1.00)
GFR calc Af Amer: >60 mL/min (ref >60)
GFR calc non Af Amer: >60 mL/min (ref >60)
Glucose, Bld: 97 mg/dL (ref 70–99)
Potassium: 3.3 mmol/L — ABNORMAL LOW (ref 3.5–5.1)
Sodium: 138 mmol/L (ref 135–145)

## 2019-07-24 LAB — MAGNESIUM: Magnesium: 1.8 mg/dL (ref 1.7–2.4)

## 2019-07-24 MED ORDER — ONDANSETRON HCL 4 MG PO TABS: 4.0000 mg | ORAL_TABLET | Freq: Four times a day (QID) | ORAL | 0 refills | Status: DC | PRN

## 2019-07-24 MED ORDER — CARVEDILOL 3.125 MG PO TABS: 3.1250 mg | ORAL_TABLET | Freq: Two times a day (BID) | ORAL | 1 refills | Status: DC

## 2019-07-24 MED ORDER — DICYCLOMINE HCL 10 MG PO CAPS
10.0000 mg | ORAL_CAPSULE | Freq: Three times a day (TID) | ORAL | Status: DC
  Administered 2019-07-24: 14:00:00 10 mg via ORAL
  Filled 2019-07-24 (×2): qty 1

## 2019-07-24 MED ORDER — PANTOPRAZOLE SODIUM 40 MG PO TBEC: 40.0000 mg | DELAYED_RELEASE_TABLET | Freq: Every day | ORAL | 0 refills | Status: DC

## 2019-07-24 MED ORDER — SODIUM CHLORIDE 0.9 % IV BOLUS: 500.0000 mL | Freq: Once | INTRAVENOUS | Status: DC

## 2019-07-24 MED ORDER — DICYCLOMINE HCL 10 MG PO CAPS: 10.0000 mg | ORAL_CAPSULE | Freq: Three times a day (TID) | ORAL | 0 refills | Status: DC

## 2019-07-24 MED ORDER — MORPHINE SULFATE 15 MG PO TABS
15.0000 mg | ORAL_TABLET | Freq: Three times a day (TID) | ORAL | Status: DC | PRN
  Administered 2019-07-24: 10:00:00 15 mg via ORAL
  Filled 2019-07-24: qty 1

## 2019-07-24 MED ORDER — POTASSIUM CHLORIDE CRYS ER 20 MEQ PO TBCR
20.0000 meq | EXTENDED_RELEASE_TABLET | Freq: Once | ORAL | Status: AC
  Administered 2019-07-24: 20 meq via ORAL
  Filled 2019-07-24: qty 1

## 2019-07-24 MED ORDER — SODIUM CHLORIDE 0.9 % IV BOLUS: 1000.0000 mL | Freq: Once | INTRAVENOUS | Status: DC

## 2019-07-24 NOTE — Progress Notes (Addendum)
Pt loss IV access and requests not to restart IV. She states veins are sensitive and difficult stick. MD aware.

## 2019-07-24 NOTE — Discharge Summary (Signed)
Physician Discharge Summary  Melissa Allen [id number] DOB: Mar 10, 1966 DOA: 07/18/2019  PCP: Lucille Passy, MD  Admit date: 07/18/2019 Discharge date: 07/24/2019   Code Status: Full Code  Admitted From: Home Discharged to:  Home Home Health:no  Equipment/Devices:no  Discharge Condition:stable   Recommendations for Outpatient Follow-up   1. Follow up with PCP in 1 week 2. Please follow up CMP/CBC  3. Needs follow up with [facility] in 4 weeks 4. Discontinued Maxzide (maybe HCTZ caused pancreatitis?), started on carvedilol, bentyl, protonix  Hospital Summary  This is a 53 year old female with past medical history of hypertension, fibromyalgia, multiple back surgeries on pain management, history of pancreatitis, cholecystectomy and appendectomy who presented with severe abdominal pain found to have pancreatitis on admission via CT abdomen pelvis and elevated lipase.  GI has been consulted.   Patient underwent MRCP on 11/11  11/11-11/12 overnight: Patient noted to be hypoxic and volume overloaded.  She was given Lasix 40 mg IV x1 and echo was ordered.  11/12 daytime: Patient regarding hypoxia again 70s to 80s, placed on 10 L nasal cannula..  At bedside patient with crackles and wheezes on exam.  Chest x-ray ordered.  Final read of echo not available yet.  Given Lasix and nebulizer treatment  Hospital stay was prolonged as patient was not tolerating PO intake and had episode of hypoxia from hypovolemia as above which resolved. Patient tolerated PO intake prior to discharge and went home in stable condition.   A & P   Principal Problem:   Acute pancreatitis Active Problems:   Anxiety state   Depression, recurrent ([place])   Essential hypertension   Asthma   GERD   Hepatic cyst   Pancreatitis   Non-intractable vomiting   Acute on chronic respiratory failure with hypoxia (HCC)   Volume overload  SIRS secondary to acute on chronic pancreatitis, idiopathic etiology SIRS resolved.  Pancreatitis may be from HCTZ? This was stopped this hospitalization.  Has 15 mm cystic lesion along the posterior pancreatic head. GI noted mild inflammation of the pancreatic tail and minimal elevation of lipase with a family history of pancreatic cancer. + Family history of Pancreatic cancer. 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  Had persistent difficulty with PO intake and abdominal pain. KUB negative for ileus. Patient was able to have BM and tolerate CLD on 11/14 which was advanced slowly and tolerated soft diet on 11/15.  -Bentyl started with improved abdominal pain -Morphine PO inpatient as Opana not on formulary, discharged with home pain regimen -Patient needs follow up with [place] in 4 weeks and may have EUS pending GI recommendations. -Strict low fat diet and no alcohol  Acute hypoxic respiratory failure secondary to volume overload received high volumes of fluids due to pancreatitis. echo EF 55 to 60%.  was on heart failure protocol though not likely true heart failure. Started on Coreg and continued Losartan.  -Coreg 3.125 mg bid, consider uptitrating outpatient -Continue losartan  Large hepatic cyst with multiple small hepatic cysts -Per GI: May consider future referral to hepatology center once pancreatitis is resolved  Chronic pain syndrome status post PLIF L3-L4 with interbody fusion and posterior laminectomy at L4-L5 with mild degenerative changes of the visualized thoracolumbar spine on Opana at home-on hold since admission. Tolerated dilaudid IV which was switched to PO Morphine which she tolerated as Opana not on formulary -Continue home pain regimen -Stooling well prior to discharge, no bowel regimen started. Consider  starting outpatient.   Asthma stable -Continue as needed inhaler   Hypertension -Continue losartan  Tension headache resolved -Continue Cymbalta and  Flexeril  Obesity BMI 34 -Patient was given also dietary counseling  Hypercalcemia resolved with IV fluids  Hyperglycemia patient has been getting D5 with lactated Ringer -Discontinue fluids  Metabolic acidosis resolved  Hypokalemia from diuresis -Replete  Consultants  . GI  Procedures  11/11 MRCP  Antibiotics  none   Subjective  Patient evaluated at bedside. Tolerated clear liquid diet this morning with some abdominal pain but tolerable and had BM. Bentyl ordered during lunch and diet was advanced to soft. Patient tolerated soft diet well and stated she was ready for discharge. Denies any chest pain, shortness of breath, nausea, vomiting. Tolerable abdominal pain. Remaining 10 point ROS negative.    Objective   Discharge Exam: Vitals:   07/24/19 0642 07/24/19 0946  BP: 127/82 113/60  Pulse: 90 92  Resp: 18   Temp: 99.3 F (37.4 C)   SpO2: 94%    Vitals:   07/23/19 1510 07/23/19 2102 07/24/19 0642 07/24/19 0946  BP: 125/65 (!) 144/75 127/82 113/60  Pulse: 91 90 90 92  Resp: 12 18 18    Temp: 98.4 F (36.9 C) 99.9 F (37.7 C) 99.3 F (37.4 C)   TempSrc: Oral Oral Oral   SpO2: 93% 94% 94%   Weight:   87.6 kg   Height:        Physical Exam Vitals signs and nursing note reviewed.  Constitutional:      Appearance: Normal appearance.  HENT:     Head: Normocephalic and atraumatic.     Nose: Nose normal.     Mouth/Throat:     Mouth: Mucous membranes are moist.  Eyes:     Extraocular Movements: Extraocular movements intact.  Neck:     Musculoskeletal: Normal range of motion. No neck rigidity.  Cardiovascular:     Rate and Rhythm: Normal rate and regular rhythm.  Pulmonary:     Effort: Pulmonary effort is normal.     Breath sounds: Normal breath sounds.  Abdominal:     General: Abdomen is flat.     Palpations: Abdomen is soft.     Comments: Mild epigastric tenderness to palpation  Musculoskeletal: Normal range of motion.        General: No  swelling.  Neurological:     General: No focal deficit present.     Mental Status: She is alert. Mental status is at baseline.  Psychiatric:        Mood and Affect: Mood normal.        Behavior: Behavior normal.       The results of significant diagnostics from this hospitalization (including imaging, microbiology, ancillary and laboratory) are listed below for reference.     Microbiology: Recent Results (from the past 240 hour(s))  SARS CORONAVIRUS 2 (TAT 6-24 HRS) Nasopharyngeal Nasopharyngeal Swab     Status: None   Collection Time: 07/18/19  2:50 PM   Specimen: Nasopharyngeal Swab  Result Value Ref Range Status   SARS Coronavirus 2 NEGATIVE NEGATIVE Final    Comment: (NOTE) SARS-CoV-2 target nucleic acids are NOT DETECTED. The SARS-CoV-2 RNA is generally detectable in upper and lower respiratory specimens during the acute phase of infection. Negative results do not preclude SARS-CoV-2 infection, do not rule out co-infections with other pathogens, and should not be used as the sole basis for treatment or other patient management decisions. Negative results must be combined with clinical  observations, patient history, and epidemiological information. The expected result is Negative. Fact Sheet for Patients: SugarRoll.be Fact Sheet for Healthcare Providers: https://www.woods-mathews.com/ This test is not yet approved or cleared by the [place] FDA and  has been authorized for detection and/or diagnosis of SARS-CoV-2 by FDA under an Emergency Use Authorization (EUA). This EUA will remain  in effect (meaning this test can be used) for the duration of the COVID-19 declaration under Section 56 4(b)(1) of the Act, 21 U.S.C. section 360bbb-3(b)(1), unless the authorization is terminated or revoked sooner. Performed at [facility], [place] [address] [place], [place] [address]      Labs: BNP (last 3 results) No results for  input(s): BNP in the last 8760 hours. Basic Metabolic Panel: Recent Labs  Lab 07/20/19 0454 07/21/19 0559 07/22/19 0434 07/23/19 0459 07/24/19 0451  NA 140 138 137 139 138  K 3.4* 3.1* 3.0* 3.6 3.3*  CL 110 105 98 101 103  CO2 22 24 27 29 26   GLUCOSE 185* 110* 95 91 97  BUN 10 12 10 11 8   CREATININE 0.73 0.63 0.48 0.60 0.51  CALCIUM 8.3* 8.4* 8.3* 9.3 9.2  MG 2.0 1.9 1.8 1.8 1.8  PHOS 3.3  --   --   --   --    Liver Function Tests: Recent Labs  Lab 07/18/19 1019 07/19/19 0408 07/20/19 0454 07/21/19 0559 07/23/19 0459  AST 19 33 60* 35 27  ALT 18 22 55* 45* 39  ALKPHOS 101 98 89 79 87  BILITOT 0.5 0.7 0.4 0.9 0.5  PROT 7.6 7.4 6.7 6.3* 7.0  ALBUMIN 4.1 3.8 3.4* 3.3* 3.5   Recent Labs  Lab 07/18/19 1019 07/19/19 0408  LIPASE 88* 41   No results for input(s): AMMONIA in the last 168 hours. CBC: Recent Labs  Lab 07/20/19 0454 07/21/19 0559 07/22/19 0434 07/23/19 0459 07/24/19 0451  WBC 9.5 11.8* 9.0 6.8 6.5  NEUTROABS 8.1*  --   --   --   --   HGB 12.1 11.3* 11.6* 14.1 14.3  HCT 40.5 36.1 36.5 44.1 43.1  MCV 94.4 91.6 89.7 89.1 85.7  PLT 225 227 170 205 PLATELET CLUMPS NOTED ON SMEAR, UNABLE TO ESTIMATE   Cardiac Enzymes: No results for input(s): CKTOTAL, CKMB, CKMBINDEX, TROPONINI in the last 168 hours. BNP: Invalid input(s): POCBNP CBG: No results for input(s): GLUCAP in the last 168 hours. D-Dimer No results for input(s): DDIMER in the last 72 hours. Hgb A1c No results for input(s): HGBA1C in the last 72 hours. Lipid Profile No results for input(s): CHOL, HDL, LDLCALC, TRIG, CHOLHDL, LDLDIRECT in the last 72 hours. Thyroid function studies No results for input(s): TSH, T4TOTAL, T3FREE, THYROIDAB in the last 72 hours.  Invalid input(s): FREET3 Anemia work up No results for input(s): VITAMINB12, FOLATE, FERRITIN, TIBC, IRON, RETICCTPCT in the last 72 hours. Urinalysis    Component Value Date/Time   COLORURINE YELLOW 07/18/2019 1040    APPEARANCEUR CLEAR 07/18/2019 1040   LABSPEC 1.011 07/18/2019 1040   PHURINE 8.0 07/18/2019 1040   GLUCOSEU NEGATIVE 07/18/2019 1040   HGBUR NEGATIVE 07/18/2019 1040   BILIRUBINUR NEGATIVE 07/18/2019 1040   BILIRUBINUR neg 09/09/2017 1642   KETONESUR NEGATIVE 07/18/2019 1040   PROTEINUR NEGATIVE 07/18/2019 1040   UROBILINOGEN 0.2 09/09/2017 1642   UROBILINOGEN 0.2 09/18/2013 1242   NITRITE NEGATIVE 07/18/2019 1040   LEUKOCYTESUR LARGE (A) 07/18/2019 1040   Sepsis Labs Invalid input(s): PROCALCITONIN,  WBC,  LACTICIDVEN Microbiology Recent Results (from the  past 240 hour(s))  SARS CORONAVIRUS 2 (TAT 6-24 HRS) Nasopharyngeal Nasopharyngeal Swab     Status: None   Collection Time: 07/18/19  2:50 PM   Specimen: Nasopharyngeal Swab  Result Value Ref Range Status   SARS Coronavirus 2 NEGATIVE NEGATIVE Final    Comment: (NOTE) SARS-CoV-2 target nucleic acids are NOT DETECTED. The SARS-CoV-2 RNA is generally detectable in upper and lower respiratory specimens during the acute phase of infection. Negative results do not preclude SARS-CoV-2 infection, do not rule out co-infections with other pathogens, and should not be used as the sole basis for treatment or other patient management decisions. Negative results must be combined with clinical observations, patient history, and epidemiological information. The expected result is Negative. Fact Sheet for Patients: SugarRoll.be Fact Sheet for Healthcare Providers: https://www.woods-mathews.com/ This test is not yet approved or cleared by the [place] FDA and  has been authorized for detection and/or diagnosis of SARS-CoV-2 by FDA under an Emergency Use Authorization (EUA). This EUA will remain  in effect (meaning this test can be used) for the duration of the COVID-19 declaration under Section 56 4(b)(1) of the Act, 21 U.S.C. section 360bbb-3(b)(1), unless the authorization is terminated  or revoked sooner. Performed at [facility], [place] [address], [place], [place] [address]     Discharge Instructions     Discharge Instructions    Diet - low sodium heart healthy   Complete by: As directed    Discharge instructions   Complete by: As directed    You were seen and examined in the hospital for pancreatitis and cared for by a hospitalist as well as a GI doctor.  Upon Discharge:  -Stop taking Maxzide -Start taking Carvedilol 3.125 mg twice daily -Start taking dicyclomine 10 mg capsule by mouth three times daily before meals for abdominal cramping/pain -Take Zofran every 6 hours as needed for nausea -Take pantoprazole 40 mg daily for acid reflux before breakfast - Continue to Increase your diet slowly at home and do not have fatty foods as this can upset your stomach -Continue your pain medications at home but monitor for constipation, especially while taking dicyclomine as we discussed -Follow up with GI in one month Make an appointment with your primary care physician within 7 days Get lab work prior to your follow up appointment with your PCP Bring all home medications to your appointment to review Request that your primary physician go over all hospital tests and procedures/radiological results at the follow up.   Please get all hospital records sent to your physician by signing a hospital release before you go home.     Read the complete instructions along with all the possible side effects for all the medicines you take and that have been prescribed to you. Take any new medicines after you have completely understood and accept all the possible adverse reactions/side effects.   If you have any questions about your discharge medications or the care you received while you were in the hospital, you can call the unit and asked to speak with the hospitalist on call. Once you are discharged, your primary care physician will handle any further medical issues.  Please note that NO REFILLS for any discharge medications will be authorized, as it is imperative that you return to your primary care physician (or establish a relationship with a primary care physician if you do not have one) for your aftercare needs so that they can reassess your need for medications and monitor your lab values.  Do not drive, operate heavy machinery, perform activities at heights, swimming or participation in water activities or provide baby sitting services if your were admitted for loss of consciousness/seizures or if you are on sedating medications including, but not limited to benzodiazepines, sleep medications, narcotic pain medications, etc., until you have been cleared to do so by a medical doctor.   Do not take more than prescribed medications.   Wear a seat belt while driving.  If you have smoked or chewed Tobacco in the last 2 years please stop smoking; also stop any regular Alcohol and/or any Recreational drug use including marijuana.  If you experience worsening of your admission symptoms or develop shortness of breath, chest pain, suicidal or homicidal thoughts or experience a life threatening emergency, you must seek medical attention immediately by calling 911 or calling your PCP immediately.   Increase activity slowly   Complete by: As directed    Other Restrictions   Complete by: As directed    No alcohol     Allergies as of 07/24/2019      Reactions   Latex Itching, Swelling, Dermatitis   SWELLING FROM FACE MASK RING AFTER LAST SURGERY   Lisinopril Cough      Codeine Nausea And Vomiting   Hydrocodone Itching   Tape Rash   TOLERATES PAPER TAPE ONLY NO PINK SURGICAL TAPE EITHER, EKG Leads       Medication List    STOP taking these medications   triamterene-hydrochlorothiazide 37.5-25 MG tablet Commonly known as: MAXZIDE-25     TAKE these medications   albuterol 108 (90 Base) MCG/ACT inhaler Commonly known as: ProAir HFA INHALE TWO PUFFS  EVERY FOUR HOURS AS NEEDED SHORTNESS OF BREATH What changed:   how much to take  how to take this  when to take this  reasons to take this  additional instructions   ALPRAZolam 1 MG tablet Commonly known as: XANAX Take 1 tablet (1 mg total) by mouth 2 (two) times daily as needed for anxiety. Start taking on: September 10, 2019   carvedilol 3.125 MG tablet Commonly known as: COREG Take 1 tablet (3.125 mg total) by mouth 2 (two) times daily with a meal.   cyclobenzaprine 5 MG tablet Commonly known as: FLEXERIL Take 5 mg by mouth 3 (three) times daily as needed for muscle spasms.   dicyclomine 10 MG capsule Commonly known as: BENTYL Take 1 capsule (10 mg total) by mouth 3 (three) times daily before meals.   DULoxetine 30 MG capsule Commonly known as: CYMBALTA Take 90 mg by mouth at bedtime.   estrogens (conjugated) 0.3 MG tablet Commonly known as: Premarin TAKE 1 TABLET BY MOUTH DAILY (TAKE FOR 21 DAYS THEN DO NOT TAKE FOR 7DAYS) What changed:   how much to take  how to take this  when to take this  additional instructions   fluticasone 50 MCG/ACT nasal spray Commonly known as: FLONASE USE 2 SPRAYS INTO EACH NOSTRIL ONCE DAILY AS DIRECTED What changed:   how much to take  how to take this  when to take this  additional instructions   ibuprofen 200 MG tablet Commonly known as: ADVIL You can safely take 3 tablets every 6 hours as needed for pain.  He can alternate this with plain Tylenol.  You can also alternate with the prescribed narcotic.  I would recommend to use the Tylenol and ibuprofen as your first line pain medications. What changed:   how much to take  how to take this  when to take this  reasons to take this   ipratropium-albuterol 0.5-2.5 (3) MG/3ML Soln Commonly known as: DUONEB Take 3 mLs by nebulization every 4 (four) hours as needed.   losartan 50 MG tablet Commonly known as: COZAAR TAKE 1 TABLET BY MOUTH ONCE DAILY    mometasone-formoterol 200-5 MCG/ACT Aero Commonly known as: DULERA Inhale 2 puffs into the lungs 2 (two) times daily.   mupirocin ointment 2 % Commonly known as: Bactroban Apply to wound prior to apply dressing once daily.   ondansetron 4 MG tablet Commonly known as: ZOFRAN Take 1 tablet (4 mg total) by mouth every 6 (six) hours as needed for nausea.   oxymorphone 5 MG tablet Commonly known as: OPANA Take 5 mg by mouth every 4 (four) hours.   pantoprazole 40 MG tablet Commonly known as: PROTONIX Take 1 tablet (40 mg total) by mouth daily at 6 (six) AM. Start taking on: July 25, 2019   silver sulfADIAZINE 1 % cream Commonly known as: Silvadene Apply 1 application topically daily.   zolpidem 10 MG tablet Commonly known as: AMBIEN Take 1 tablet (10 mg total) by mouth at bedtime.      Follow-up Information    Irene Shipper, MD Follow up on 08/31/2019.   Specialty: Gastroenterology Why: 10 AM for follow up of pancreatitis.   Contact information: [address] [place] [address] [phone]          Allergies  Allergen Reactions  . Latex Itching, Swelling and Dermatitis    SWELLING FROM FACE MASK RING AFTER LAST SURGERY   . Lisinopril Cough       . Codeine Nausea And Vomiting  . Hydrocodone Itching  . Tape Rash    TOLERATES PAPER TAPE ONLY NO PINK SURGICAL TAPE EITHER, EKG Leads     Time coordinating discharge: Over 30 minutes   SIGNED:   Harold Hedge, D.O. [facility] Pager: [phone]  07/24/2019, 3:27 PM

## 2019-07-25 ENCOUNTER — Telehealth: Payer: Self-pay

## 2019-07-25 DIAGNOSIS — K861 Other chronic pancreatitis: Secondary | ICD-10-CM

## 2019-07-25 NOTE — Telephone Encounter (Signed)
Copied from [place] [phone]. Topic: General - Other >> Jul 25, 2019  3:45 PM Yvette Rack wrote: Reason for CRM: Pt stated she was told that she has a cyst on her liver and pancreas. Pt stated her mother passed away from this and she would like to request that Dr. Deborra Medina call her to discuss. Cb# [phone]

## 2019-07-26 NOTE — Telephone Encounter (Signed)
I spoke with pt to ask if she would like to move up her appointment from Monday to Thursday to discuss issue w/Dr. Deborra Medina.  Pt said that she was hoping to get a call before her visit and that she really wanted it to be in office.  I explained to pt that Dr. Deborra Medina is seeing pt's and that is why I offered her a virtual visit but pt refused.  I informed pt that I would send this message to Dr. Deborra Medina but I am not sure when she will be able to give pt a call.

## 2019-07-28 ENCOUNTER — Telehealth: Payer: Self-pay

## 2019-07-28 NOTE — Telephone Encounter (Signed)
Left VM for pt to return my call  

## 2019-07-28 NOTE — Telephone Encounter (Signed)
Hello Dr. Deborra Medina. When you get a chance, can you look at the telephone encouter I just sent you about [name]. She is on your scheduel for Monday. I thought she could get her labs drawn here due to them being a future from the hospital. I jumped the gun before I asked. Can you please take a look at her labs and let me know if I should call her back? Thank you.

## 2019-07-28 NOTE — Telephone Encounter (Signed)
Pt called stating she is needing an answer. Pt is requesting to have labs done. Pt states she also has some questions about the medications she is on as well.  Please advise.

## 2019-07-28 NOTE — Telephone Encounter (Signed)
LDM stating that I did send a message to Dr. Deborra Medina in regards to Pt wanting to speak with Dr. Deborra Medina before her moved up visit.  I also said to call office back and I will see if I can help in regards to medication questions.

## 2019-07-28 NOTE — Telephone Encounter (Signed)
Pt called office back sounding very historically.  Pt said that she has not had a phone call back in regards to her hospital discharge directions.  Pt wanted to speak with Dr. Deborra Medina before her appointment on Monday, which was moved up to first available on Dr. Hulen Shouts schedule.  I looked into pt's chart and saw that there were future labs in from hospital.  I scheduled pt to come for fasting labs to be drawn tomorrow before her appointment on Monday.  I offered her a visit with a different provider due to her concerns and also due to Dr. Deborra Medina out of office today and tomorrow,  pt refused.  I informed pt that I would send this TE to Dr. Deborra Medina and get her advise on next step for pt.

## 2019-07-29 ENCOUNTER — Other Ambulatory Visit: Payer: Self-pay

## 2019-07-29 ENCOUNTER — Other Ambulatory Visit: Payer: Self-pay | Admitting: Family Medicine

## 2019-07-29 ENCOUNTER — Other Ambulatory Visit (INDEPENDENT_AMBULATORY_CARE_PROVIDER_SITE_OTHER): Payer: BC Managed Care – PPO

## 2019-07-29 DIAGNOSIS — E8779 Other fluid overload: Secondary | ICD-10-CM

## 2019-07-29 DIAGNOSIS — K861 Other chronic pancreatitis: Secondary | ICD-10-CM | POA: Diagnosis not present

## 2019-07-29 DIAGNOSIS — K859 Acute pancreatitis without necrosis or infection, unspecified: Secondary | ICD-10-CM

## 2019-07-29 LAB — COMPREHENSIVE METABOLIC PANEL
ALT: 39 U/L — ABNORMAL HIGH (ref 0–35)
AST: 20 U/L (ref 0–37)
Albumin: 4.1 g/dL (ref 3.5–5.2)
Alkaline Phosphatase: 90 U/L (ref 39–117)
BUN: 10 mg/dL (ref 6–23)
CO2: 27 mEq/L (ref 19–32)
Calcium: 9.7 mg/dL (ref 8.4–10.5)
Chloride: 103 mEq/L (ref 96–112)
Creatinine, Ser: 0.6 mg/dL (ref 0.40–1.20)
GFR: 103.96 mL/min (ref >60.00)
Glucose, Bld: 92 mg/dL (ref 70–99)
Potassium: 3.8 mEq/L (ref 3.5–5.1)
Sodium: 140 mEq/L (ref 135–145)
Total Bilirubin: 0.5 mg/dL (ref 0.2–1.2)
Total Protein: 6.6 g/dL (ref 6.0–8.3)

## 2019-07-29 LAB — LIPASE: Lipase: 69 U/L — ABNORMAL HIGH (ref 11.0–59.0)

## 2019-07-29 NOTE — Addendum Note (Signed)
Addended by: Lucille Passy on: 07/29/2019 08:44 AM   Modules accepted: Orders

## 2019-07-29 NOTE — Telephone Encounter (Signed)
I have called patient back 3 or 4 times now, most recently this morning- see phone note.  I have even given her my private cell phone number to return my call.  I am sorry we are having a very hard time reaching each other.

## 2019-07-29 NOTE — Telephone Encounter (Signed)
I just tried calling patient again and left another voicemail- gave her my cell phone number to return my call since I can't seem to reach her.  I understand her concern given especially since her mother died of pancreatic cancer.  Her MRI of her abdomen was reassuring, showing no mass on her pancreas, only a cystic lesion that was felt to be benign appearing:  Pancreas: mild residual peripancreatic edema adjacent the tail, including on 37/3. Felt to be relatively similar. A cystic lesion within the pancreatic head measures 1.1 cm on 35/3. No suspicious postcontrast characteristics, including on 56/1102. No peripancreatic fluid collection. No pancreatic necrosis. No pancreas divisum..      Per discharge summary, CMP and CBC advised in 1 week- she has lab visit scheduled for today( labs appear to already be in epic but I have reordered them just in case),  and an appointment with me scheduled for Monday.  Also has an appointment scheduled with GI on 08/31/19.  Her HCTZ was held to see if perhaps it was triggering recurrent pancreatitis.  GI may perform EUS per discharge summary which will also provide more reassurance that this is not cancerous.  An EUS is an Endoscopic ultrasound - a minimally invasive procedure to assess digestive (gastrointestinal) and lung diseases. A special endoscope uses high-frequency sound waves to produce detailed images of the lining and walls of your digestive tract and chest, nearby organs such as the pancreas and liver, and lymph nodes.

## 2019-07-30 DIAGNOSIS — K862 Cyst of pancreas: Secondary | ICD-10-CM | POA: Insufficient documentation
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
## 2019-07-31 DIAGNOSIS — R6511 Systemic inflammatory response syndrome (SIRS) of non-infectious origin with acute organ dysfunction: Secondary | ICD-10-CM | POA: Insufficient documentation

## 2019-07-31 DIAGNOSIS — Z09 Encounter for follow-up examination after completed treatment for conditions other than malignant neoplasm: Secondary | ICD-10-CM | POA: Insufficient documentation

## 2019-07-31 NOTE — Assessment & Plan Note (Signed)
One large hepatic cyst (8.1 cm on right hepatic lobe) with multiple small ones.  GI notes reviewed-  consider future referral to hepatology center once pancreatitis is resolved

## 2019-07-31 NOTE — Assessment & Plan Note (Addendum)
1.1 cm cystic lesion was found on head of pancreas. GI noted mild inflammation of the pancreatic tail and minimal elevation of lipase with a family history of pancreatic cancer. She does have a family history of pancreatic cancer (mother), so understandably Melissa Allen is concerned about the cystic lesion.  Scheduled for EUS (? With FNA) this week for further evaluation.

## 2019-07-31 NOTE — Assessment & Plan Note (Addendum)
Acute on chronic- see details under SIRS listed below. Discussed advancing her diet as tolerated.

## 2019-07-31 NOTE — Assessment & Plan Note (Signed)
Resolved- secondary to volume overloadafter receiving high volumes of fluids for pancreatitis.  She was placed on heart failure protocol but this does not seem like true heart failure.  echo EF 55 to 60%. She was started on Coreg and advised to continue Losartan.

## 2019-07-31 NOTE — Assessment & Plan Note (Addendum)
HCTZ was stopped during hospital course as it felt perhaps it was causing pancreatitis exacerbations.  She is currently taking Losartan 50 mg daily along with Coreg 3.125 mg twice daily which was started in the hospital.  She is unsure what her blood pressure readings have been- she will go buy a new OMRON cuff. BP Readings from Last 3 Encounters:  07/24/19 113/60  06/28/19 106/80  09/06/18 (!) 154/89

## 2019-07-31 NOTE — Assessment & Plan Note (Addendum)
Due to acute on chronic pancreatitis.  HCTZ was stopped during hospitalization as it was felt perhaps this was causing exacerbations of her pancreatitis.  She is on HRT which is a medication that can also lead to pancreatitis.  She does not have a gallbladder so this is not secondary to gallstones.  Triglycerides were not elevated, she did not have a divisum on MRI.  Lab Results  Component Value Date   CHOL 244 (H) 08/04/2011   HDL 71 08/04/2011   LDLCALC 141 (H) 08/04/2011   TRIG 180 (H) 07/19/2019   CHOLHDL 3.4 08/04/2011    -Bentyl started with improved abdominal pain -Morphine PO inpatient as Opana not on formulary, discharged with home pain regimen She has an appointment scheduled for EUS this week for further evaluation as well.

## 2019-08-01 ENCOUNTER — Ambulatory Visit (INDEPENDENT_AMBULATORY_CARE_PROVIDER_SITE_OTHER): Payer: BC Managed Care – PPO | Admitting: Family Medicine

## 2019-08-01 ENCOUNTER — Encounter: Payer: Self-pay | Admitting: Family Medicine

## 2019-08-01 ENCOUNTER — Other Ambulatory Visit: Payer: Self-pay

## 2019-08-01 VITALS — Ht 64.0 in | Wt 190.0 lb

## 2019-08-01 DIAGNOSIS — Z09 Encounter for follow-up examination after completed treatment for conditions other than malignant neoplasm: Secondary | ICD-10-CM

## 2019-08-01 DIAGNOSIS — K8500 Idiopathic acute pancreatitis without necrosis or infection: Secondary | ICD-10-CM

## 2019-08-01 DIAGNOSIS — R6511 Systemic inflammatory response syndrome (SIRS) of non-infectious origin with acute organ dysfunction: Secondary | ICD-10-CM | POA: Diagnosis not present

## 2019-08-01 DIAGNOSIS — K861 Other chronic pancreatitis: Secondary | ICD-10-CM

## 2019-08-01 DIAGNOSIS — K862 Cyst of pancreas: Secondary | ICD-10-CM

## 2019-08-01 DIAGNOSIS — N3001 Acute cystitis with hematuria: Secondary | ICD-10-CM

## 2019-08-01 DIAGNOSIS — I10 Essential (primary) hypertension: Secondary | ICD-10-CM

## 2019-08-01 DIAGNOSIS — J9621 Acute and chronic respiratory failure with hypoxia: Secondary | ICD-10-CM

## 2019-08-01 DIAGNOSIS — K7689 Other specified diseases of liver: Secondary | ICD-10-CM

## 2019-08-01 MED ORDER — CIPROFLOXACIN HCL 500 MG PO TABS: 500.0000 mg | ORAL_TABLET | Freq: Two times a day (BID) | ORAL | 0 refills | Status: DC

## 2019-08-01 NOTE — Addendum Note (Signed)
Addended by: Lucille Passy on: 08/01/2019 09:55 AM   Modules accepted: Orders

## 2019-08-01 NOTE — Assessment & Plan Note (Signed)
UA in ED showed large bacteria and LE, no cx sent.  She is not having dysuria but does not feel "right." Will send cipro 500 mg twice daily x 3 days.

## 2019-08-15 ENCOUNTER — Other Ambulatory Visit: Payer: Self-pay | Admitting: Family Medicine

## 2019-08-16 NOTE — Telephone Encounter (Signed)
TA-Pt had recent visit/refill req for zolpidem/last given on 11.2.20 without refills/pt is compliant/thx dmf

## 2019-08-17 ENCOUNTER — Encounter (HOSPITAL_COMMUNITY): Payer: Self-pay

## 2019-08-17 ENCOUNTER — Other Ambulatory Visit: Payer: Self-pay

## 2019-08-17 ENCOUNTER — Emergency Department (HOSPITAL_COMMUNITY): Payer: BC Managed Care – PPO

## 2019-08-17 ENCOUNTER — Emergency Department (HOSPITAL_COMMUNITY)
Admission: EM | Admit: 2019-08-17 | Discharge: 2019-08-17 | Disposition: A | Discharge status: Home / Self Care | Payer: BC Managed Care – PPO | Attending: Emergency Medicine | Admitting: Emergency Medicine

## 2019-08-17 DIAGNOSIS — I10 Essential (primary) hypertension: Secondary | ICD-10-CM | POA: Insufficient documentation

## 2019-08-17 DIAGNOSIS — J45909 Unspecified asthma, uncomplicated: Secondary | ICD-10-CM | POA: Diagnosis not present

## 2019-08-17 DIAGNOSIS — R1084 Generalized abdominal pain: Secondary | ICD-10-CM

## 2019-08-17 DIAGNOSIS — Z91040 Latex allergy status: Secondary | ICD-10-CM | POA: Diagnosis not present

## 2019-08-17 DIAGNOSIS — Z79899 Other long term (current) drug therapy: Secondary | ICD-10-CM | POA: Diagnosis not present

## 2019-08-17 LAB — COMPREHENSIVE METABOLIC PANEL
ALT: 18 U/L (ref 0–44)
AST: 23 U/L (ref 15–41)
Albumin: 3.8 g/dL (ref 3.5–5.0)
Alkaline Phosphatase: 82 U/L (ref 38–126)
Anion gap: 11 (ref 5–15)
BUN: 10 mg/dL (ref 6–20)
CO2: 31 mmol/L (ref 22–32)
Calcium: 9.7 mg/dL (ref 8.9–10.3)
Chloride: 97 mmol/L — ABNORMAL LOW (ref 98–111)
Creatinine, Ser: 0.63 mg/dL (ref 0.44–1.00)
GFR calc Af Amer: >60 mL/min (ref >60)
GFR calc non Af Amer: >60 mL/min (ref >60)
Glucose, Bld: 98 mg/dL (ref 70–99)
Potassium: 2.6 mmol/L — CL (ref 3.5–5.1)
Sodium: 139 mmol/L (ref 135–145)
Total Bilirubin: 0.5 mg/dL (ref 0.3–1.2)
Total Protein: 7 g/dL (ref 6.5–8.1)

## 2019-08-17 LAB — CBC WITH DIFFERENTIAL/PLATELET
Abs Immature Granulocytes: 0.01 10*3/uL (ref 0.00–0.07)
Basophils Absolute: 0.1 10*3/uL (ref 0.0–0.1)
Basophils Relative: 1 %
Eosinophils Absolute: 0.2 10*3/uL (ref 0.0–0.5)
Eosinophils Relative: 4 %
HCT: 40.9 % (ref 36.0–46.0)
Hemoglobin: 13.1 g/dL (ref 12.0–15.0)
Immature Granulocytes: 0 %
Lymphocytes Relative: 36 %
Lymphs Abs: 1.9 10*3/uL (ref 0.7–4.0)
MCH: 28 pg (ref 26.0–34.0)
MCHC: 32 g/dL (ref 30.0–36.0)
MCV: 87.4 fL (ref 80.0–100.0)
Monocytes Absolute: 0.5 10*3/uL (ref 0.1–1.0)
Monocytes Relative: 9 %
Neutro Abs: 2.6 10*3/uL (ref 1.7–7.7)
Neutrophils Relative %: 50 %
Platelets: 257 10*3/uL (ref 150–400)
RBC: 4.68 MIL/uL (ref 3.87–5.11)
RDW: 13.2 % (ref 11.5–15.5)
WBC: 5.3 10*3/uL (ref 4.0–10.5)
nRBC: 0 % (ref 0.0–0.2)

## 2019-08-17 LAB — URINALYSIS, ROUTINE W REFLEX MICROSCOPIC
Bilirubin Urine: NEGATIVE
Glucose, UA: NEGATIVE mg/dL
Hgb urine dipstick: NEGATIVE
Ketones, ur: NEGATIVE mg/dL
Leukocytes,Ua: NEGATIVE
Nitrite: NEGATIVE
Protein, ur: NEGATIVE mg/dL
Specific Gravity, Urine: 1.005 (ref 1.005–1.030)
pH: 8 (ref 5.0–8.0)

## 2019-08-17 LAB — LIPASE, BLOOD: Lipase: 39 U/L (ref 11–51)

## 2019-08-17 LAB — LACTIC ACID, PLASMA: Lactic Acid, Venous: 0.9 mmol/L (ref 0.5–1.9)

## 2019-08-17 LAB — MAGNESIUM: Magnesium: 1.9 mg/dL (ref 1.7–2.4)

## 2019-08-17 MED ORDER — SODIUM CHLORIDE 0.9 % IV BOLUS
1000.0000 mL | Freq: Once | INTRAVENOUS | Status: AC
  Administered 2019-08-17: 1000 mL via INTRAVENOUS

## 2019-08-17 MED ORDER — POTASSIUM CHLORIDE ER 10 MEQ PO TBCR: 10.0000 meq | EXTENDED_RELEASE_TABLET | Freq: Every day | ORAL | 0 refills | Status: DC

## 2019-08-17 MED ORDER — POTASSIUM CHLORIDE 10 MEQ/100ML IV SOLN
10.0000 meq | INTRAVENOUS | Status: AC
  Administered 2019-08-17 (×3): 10 meq via INTRAVENOUS
  Filled 2019-08-17 (×3): qty 100

## 2019-08-17 MED ORDER — HYDROMORPHONE HCL 1 MG/ML IJ SOLN
1.0000 mg | Freq: Once | INTRAMUSCULAR | Status: AC
  Administered 2019-08-17: 1 mg via INTRAVENOUS
  Filled 2019-08-17: qty 1

## 2019-08-17 MED ORDER — POTASSIUM CHLORIDE CRYS ER 20 MEQ PO TBCR
40.0000 meq | EXTENDED_RELEASE_TABLET | Freq: Once | ORAL | Status: AC
  Administered 2019-08-17: 16:00:00 40 meq via ORAL
  Filled 2019-08-17: qty 2

## 2019-08-17 MED ORDER — ONDANSETRON HCL 4 MG/2ML IJ SOLN
4.0000 mg | Freq: Once | INTRAMUSCULAR | Status: AC
  Administered 2019-08-17: 4 mg via INTRAVENOUS
  Filled 2019-08-17: qty 2

## 2019-08-17 MED ORDER — HYDROMORPHONE HCL 2 MG/ML IJ SOLN
2.0000 mg | Freq: Once | INTRAMUSCULAR | Status: AC
  Administered 2019-08-17: 2 mg via INTRAVENOUS
  Filled 2019-08-17: qty 1

## 2019-08-17 NOTE — Discharge Instructions (Addendum)
Today your lipase, pancreas enzyme, was normal.  Please schedule a follow-up appointment with your primary care doctor.  Today you received medications that may make you sleepy or impair your ability to make decisions.  For the next 24 hours please do not drive, operate heavy machinery, care for a small child with out another adult present, or perform any activities that may cause harm to you or someone else if you were to fall asleep or be impaired.

## 2019-08-17 NOTE — ED Notes (Signed)
Pt stated she was unable to urinate at this time, will monitor.
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
## 2019-08-17 NOTE — ED Triage Notes (Signed)
Pt was seen last month for pancreatitis, and returns today with complaints that it has not gone away.  Pt c/o abd pain that radiates to back.

## 2019-08-17 NOTE — ED Notes (Signed)
Provided pt with water for fluid/PO challenge.

## 2019-08-17 NOTE — ED Notes (Signed)
Patient is unable to provide urine sample at this time. This Probation officer will assess for sample after fluid administration.

## 2019-08-19 DIAGNOSIS — Z09 Encounter for follow-up examination after completed treatment for conditions other than malignant neoplasm: Secondary | ICD-10-CM | POA: Insufficient documentation

## 2019-08-19 DIAGNOSIS — R109 Unspecified abdominal pain: Secondary | ICD-10-CM | POA: Insufficient documentation

## 2019-08-19 DIAGNOSIS — Z0000 Encounter for general adult medical examination without abnormal findings: Secondary | ICD-10-CM | POA: Insufficient documentation

## 2019-08-19 NOTE — Progress Notes (Signed)
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   Color, Urine YELLOW YELLOW   APPearance CLEAR CLEAR   Specific Gravity, Urine 1.005 1.005 - 1.030   pH 8.0  5.0 - 8.0   Glucose, UA NEGATIVE NEGATIVE mg/dL  Hgb urine dipstick NEGATIVE NEGATIVE   Bilirubin Urine NEGATIVE NEGATIVE   Ketones, ur NEGATIVE NEGATIVE mg/dL   Protein, ur NEGATIVE NEGATIVE mg/dL   Nitrite NEGATIVE NEGATIVE   Leukocytes,Ua NEGATIVE NEGATIVE    Comment: Performed at [facility], [place] [address] [place], [place] [address]     No results found.   Review of Systems  Constitutional: Negative for malaise/fatigue.  HENT: Negative for congestion and hearing loss.   Eyes: Negative for blurred vision, discharge and redness.  Respiratory: Negative for cough and shortness of breath.   Cardiovascular: Negative for chest pain, palpitations and leg swelling.  Gastrointestinal: Positive for abdominal pain. Negative for constipation, diarrhea, heartburn, nausea and vomiting.  Genitourinary: Negative.  Negative for dysuria.  Musculoskeletal: Negative for falls.  Skin: Negative for rash.  Neurological: Negative for loss of consciousness and headaches.  Endo/Heme/Allergies: Does not bruise/bleed easily.  Psychiatric/Behavioral: Negative for depression.  All other systems reviewed and are negative.    Patient Active Problem List   Diagnosis Date Noted  . Encounter for examination following treatment at hospital 08/19/2019  . Abdominal pain 08/19/2019  . SIRS of non-infectious origin w acute organ dysfunction ([place]) 07/31/2019  . Hospital discharge follow-up 07/31/2019  . Pancreatic cyst 07/30/2019  . Acute on chronic respiratory failure with hypoxia ([place]) 07/21/2019  . Volume overload 07/21/2019  . Non-intractable vomiting   . Pancreatitis 07/18/2019  . Acute pancreatitis 07/18/2019  . Thermal burn 07/11/2019  . Asthma, chronic obstructive, with acute exacerbation ([place]) 07/11/2019  . Need for Tdap vaccination 07/11/2019  . Acute appendicitis 08/05/2017  . Painful orthopaedic hardware ([place]) 12/22/2016  . Ureteric stone 10/02/2016  . Hot flashes  01/08/2016  . Hypokalemia 07/31/2015  . Spinal stenosis, lumbar region, 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  . Degenerative spondylolisthesis 05/01/2015  . Insomnia 09/30/2013  . HEADACHE 04/26/2010  . Fibromyalgia 01/25/2010  . Anxiety state 01/22/2010  . Depression, recurrent ([place]) 01/22/2010  . REFLUX GASTRITIS 01/22/2010  . 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 NEPHROLITHIASIS, HX OF 01/22/2010  . Essential hypertension 01/01/2009  . Palpitations 01/11/2008  . Asthma 11/26/2007  . GERD 11/26/2007  . PANCREATITIS, CHRONIC 11/26/2007  . RENAL CALCULUS 11/26/2007  . Arthropathy 11/26/2007  . Allergic rhinitis 11/24/2007    Social History   Tobacco Use  . Smoking status: Never Smoker  . Smokeless tobacco: Never Used  Substance Use Topics  . Alcohol use: Yes    Alcohol/week: 7.0 standard drinks    Types: 7 Standard drinks or equivalent per week    Comment: Occasional- wine or liquor twice a week    Current Outpatient Medications:  .  albuterol (PROAIR HFA) 108 (90 Base) MCG/ACT inhaler, INHALE TWO PUFFS EVERY FOUR HOURS AS NEEDED SHORTNESS OF BREATH (Patient taking differently: Inhale 2 puffs into the lungs every 4 (four) hours as needed for shortness of breath. ), Disp: 8.5 g, Rfl: 2 .  [START ON 09/10/2019] ALPRAZolam (XANAX) 1 MG tablet, Take 1 tablet (1 mg total) by mouth 2 (two) times daily as needed for anxiety., Disp: 20 tablet, Rfl: 0 .  carvedilol (COREG) 3.125 MG tablet, Take 1 tablet (3.125 mg total) by mouth 2 (two) times daily with a meal., Disp: 30 tablet, Rfl: 1 .  ciprofloxacin (CIPRO) 500 MG tablet, Take 1 tablet (500 mg total) by mouth 2 (two) times daily., Disp: 6 tablet, Rfl: 0 .  cyclobenzaprine (FLEXERIL) 5 MG tablet, Take 5 mg by mouth 3 (three) times daily as needed for muscle spasms.,  Disp: , Rfl:  .  dicyclomine (BENTYL) 10 MG capsule, Take 1 capsule (10 mg total) by mouth 3 (three) times daily before meals., Disp: 90 capsule, Rfl: 0 .  DULoxetine (CYMBALTA) 60  MG capsule, Take 90 mg by mouth daily. , Disp: , Rfl:  .  estrogens, conjugated, (PREMARIN) 0.3 MG tablet, TAKE 1 TABLET BY MOUTH DAILY (TAKE FOR 21 DAYS THEN DO NOT TAKE FOR 7DAYS) (Patient taking differently: Take 0.3 mg by mouth as directed. Take 1 tablet (0.3mg ) every day for 21 days, then do not take medication for 7 days), Disp: 90 tablet, Rfl: 3 .  fluticasone (FLONASE) 50 MCG/ACT nasal spray, USE 2 SPRAYS INTO EACH NOSTRIL ONCE DAILY AS DIRECTED (Patient taking differently: Place 2 sprays into both nostrils daily. ), Disp: 16 g, Rfl: 11 .  ibuprofen (ADVIL,MOTRIN) 200 MG tablet, You can safely take 3 tablets every 6 hours as needed for pain.  He can alternate this with plain Tylenol.  You can also alternate with the prescribed narcotic.  I would recommend to use the Tylenol and ibuprofen as your first line pain medications. (Patient taking differently: Take 600 mg by mouth every 6 (six) hours as needed (pain). You can safely take 3 tablets every 6 hours as needed for pain.  He can alternate this with plain Tylenol.  You can also alternate with the prescribed narcotic.  I would recommend to use the Tylenol and ibuprofen as your first line pain medications.), Disp: , Rfl:  .  losartan (COZAAR) 50 MG tablet, TAKE 1 TABLET BY MOUTH ONCE DAILY (Patient taking differently: Take 50 mg by mouth daily. ), Disp: 90 tablet, Rfl: 1 .  ondansetron (ZOFRAN) 4 MG tablet, Take 1 tablet (4 mg total) by mouth every 6 (six) hours as needed for nausea., Disp: 20 tablet, Rfl: 0 .  oxymorphone (OPANA) 5 MG tablet, Take 5 mg by mouth every 4 (four) hours., Disp: , Rfl:  .  zolpidem (AMBIEN) 10 MG tablet, TAKE 1 TABLET BY MOUTH AT BEDTIME (Patient taking differently: Take 10 mg by mouth at bedtime. ), Disp: 30 tablet, Rfl: 5 .  mupirocin ointment (BACTROBAN) 2 %, Apply to wound prior to apply dressing once daily. (Patient not taking: Reported on 08/22/2019), Disp: 22 g, Rfl: 0 .  pantoprazole (PROTONIX) 40 MG tablet, Take  1 tablet (40 mg total) by mouth 2 (two) times daily before a meal., Disp: 60 tablet, Rfl: 3 .  silver sulfADIAZINE (SILVADENE) 1 % cream, Apply 1 application topically daily. (Patient not taking: Reported on 08/22/2019), Disp: 50 g, Rfl: 0  Allergies  Allergen Reactions  . Latex Itching, Swelling and Dermatitis    SWELLING FROM FACE MASK RING AFTER LAST SURGERY   . Lisinopril Cough       . Codeine Nausea And Vomiting  . Hydrocodone Itching  . Tape Rash    TOLERATES PAPER TAPE ONLY NO PINK SURGICAL TAPE EITHER, EKG Leads     Objective:  Ht 5\' 4"  (1.626 m)   Wt 186 lb (84.4 kg)   LMP 12/21/2011   BMI 31.93 kg/m   VITALS: Per patient if applicable, see vitals. GENERAL: Alert, appears well and in no acute distress. HEENT: Atraumatic, conjunctiva clear, no obvious abnormalities on inspection of external nose and ears. NECK: Normal movements of the head and neck. CARDIOPULMONARY: No increased WOB. Speaking in clear sentences. I:E ratio WNL.  MS: Moves all visible extremities without noticeable abnormality. PSYCH: Pleasant and cooperative, well-groomed. Speech normal rate and  rhythm. Affect is appropriate. Insight and judgement are appropriate. Attention is focused, linear, and appropriate.  NEURO: CN grossly intact. Oriented as arrived to appointment on time with no prompting. Moves both UE equally.  SKIN: No obvious lesions, wounds, erythema, or cyanosis noted on face or hands.  Depression screen PHQ 2/9 08/01/2019  Decreased Interest 1  Down, Depressed, Hopeless 0  PHQ - 2 Score 1  Altered sleeping 3  Tired, decreased energy 3  Change in appetite 3  Feeling bad or failure about yourself  0  Trouble concentrating 0  Moving slowly or fidgety/restless 0  Suicidal thoughts 0  PHQ-9 Score 10  Difficult doing work/chores Somewhat difficult  Some recent data might be hidden     . COVID-19 Education: The signs and symptoms of COVID-19 were discussed with the patient and how to  seek care for testing if needed. The importance of social distancing was discussed today. . Reviewed expectations re: course of current medical issues. . Discussed self-management of symptoms. . Outlined signs and symptoms indicating need for more acute intervention. . Patient verbalized understanding and all questions were answered. Marland Kitchen Health Maintenance issues including appropriate healthy diet, exercise, and smoking avoidance were discussed with patient. . See orders for this visit as documented in the electronic medical record.   Records requested if needed. Time spent: 40 minutes, of which >50% was spent in obtaining information about her symptoms, reviewing her previous labs, evaluations, and treatments, counseling her about her condition (please see the discussed topics above), and developing a plan to further investigate it; she had a number of questions which I addressed.   Lab Results  Component Value Date   WBC 5.3 08/17/2019   HGB 13.1 08/17/2019   HCT 40.9 08/17/2019   PLT 257 08/17/2019   GLUCOSE 98 08/17/2019   CHOL 244 (H) 08/04/2011   TRIG 180 (H) 07/19/2019   HDL 71 08/04/2011   LDLCALC 141 (H) 08/04/2011   ALT 18 08/17/2019   AST 23 08/17/2019   NA 139 08/17/2019   K 2.6 (LL) 08/17/2019   CL 97 (L) 08/17/2019   CREATININE 0.63 08/17/2019   BUN 10 08/17/2019   CO2 31 08/17/2019   TSH 1.21 12/31/2015   INR 1.0 07/18/2019   HGBA1C 5.7 (H) 07/20/2019    Lab Results  Component Value Date   TSH 1.21 12/31/2015   Lab Results  Component Value Date   WBC 5.3 08/17/2019   HGB 13.1 08/17/2019   HCT 40.9 08/17/2019   MCV 87.4 08/17/2019   PLT 257 08/17/2019   Lab Results  Component Value Date   NA 139 08/17/2019   K 2.6 (LL) 08/17/2019   CO2 31 08/17/2019   GLUCOSE 98 08/17/2019   BUN 10 08/17/2019   CREATININE 0.63 08/17/2019   BILITOT 0.5 08/17/2019   ALKPHOS 82 08/17/2019   AST 23 08/17/2019   ALT 18 08/17/2019   PROT 7.0 08/17/2019   ALBUMIN 3.8  08/17/2019   CALCIUM 9.7 08/17/2019   ANIONGAP 11 08/17/2019   GFR 103.96 07/29/2019   Lab Results  Component Value Date   CHOL 244 (H) 08/04/2011   Lab Results  Component Value Date   HDL 71 08/04/2011   Lab Results  Component Value Date   LDLCALC 141 (H) 08/04/2011   Lab Results  Component Value Date   TRIG 180 (H) 07/19/2019   Lab Results  Component Value Date   CHOLHDL 3.4 08/04/2011   Lab Results  Component Value Date  HGBA1C 5.7 (H) 07/20/2019       Assessment & Plan:   Problem List Items Addressed This Visit      Active Problems   Hypokalemia    Taking potassium 10 meq daily.   Repeat labs today.  Lab Results  Component Value Date   K 2.6 (LL) 08/17/2019  Discussed high potassium foods with tp:  High potassium content foods  Highest content (>25 meq/100 g) High content (>6.2 meq/100 g)   Vegetables   Spinach   Tomatoes   Broccoli   Winter squash   Beets   Carrots   Cauliflower   Potatoes   Fruits   Bananas  Dried figs Cantaloupe  Molasses Kiwis  Seaweed Oranges  Very high content (>12.5 meq/100 g) Mangos  Dried fruits (dates, prunes) Meats  Nuts Ground beef  Avocados Steak  Bran cereals Pork  Wheat germ Veal  Lima beans Lamb         Relevant Orders   Comprehensive metabolic panel   Pancreatitis    Lipase now normal.      Relevant Medications   pantoprazole (PROTONIX) 40 MG tablet   Other Relevant Orders   Lipase   CBC   Encounter for examination following treatment at hospital - Primary    >40 minutes spent in face to face time with patient, >50% spent in counselling or coordination of care.       Relevant Orders   Comprehensive metabolic panel   Abdominal pain    Due to pain and nausea, has not been eating much.  Given 1 L of IVFs and zolfran. Continue protonix.  Has appointment with GI on 12/23.  Lipase normal.  Lab Results  Component Value Date   LIPASE 39 08/17/2019   She is already scheduled for  outpatient GI appointment.  I suspect she may have gastric ulcers based on her symptoms.  She is already on Protonix- will increase Protonix to 40 mg twice daily.  Recommended bland diet.         Relevant Orders   Urinalysis, Routine w reflex microscopic      I have discontinued Madinah Dionicio. Neyhart's pantoprazole and potassium chloride. I am also having her start on pantoprazole. Additionally, I am having her maintain her albuterol, ibuprofen, oxymorphone, fluticasone, estrogens (conjugated), losartan, ALPRAZolam, silver sulfADIAZINE, mupirocin ointment, carvedilol, dicyclomine, ondansetron, ciprofloxacin, zolpidem, DULoxetine, and cyclobenzaprine.  Meds ordered this encounter  Medications  . pantoprazole (PROTONIX) 40 MG tablet    Sig: Take 1 tablet (40 mg total) by mouth 2 (two) times daily before a meal.    Dispense:  60 tablet    Refill:  3     Arnette Norris, MD

## 2019-08-19 NOTE — Assessment & Plan Note (Signed)
Lipase now normal.

## 2019-08-19 NOTE — Assessment & Plan Note (Addendum)
Due to pain and nausea, has not been eating much.  Given 1 L of IVFs and zolfran. Continue protonix.  Has appointment with GI on 12/23.  Lipase normal.  Lab Results  Component Value Date   LIPASE 39 08/17/2019   She is already scheduled for outpatient GI appointment.  I suspect she may have gastric ulcers based on her symptoms.  She is already on Protonix- will increase Protonix to 40 mg twice daily.  Recommended bland diet.

## 2019-08-19 NOTE — Assessment & Plan Note (Signed)
Taking potassium 10 meq daily.   Repeat labs today.  Lab Results  Component Value Date   K 2.6 (LL) 08/17/2019  Discussed high potassium foods with tp:  High potassium content foods  Highest content (>25 meq/100 g) High content (>6.2 meq/100 g)   Vegetables   Spinach   Tomatoes   Broccoli   Winter squash   Beets   Carrots   Cauliflower   Potatoes   Fruits   Bananas  Dried figs Cantaloupe  Molasses Kiwis  Seaweed Oranges  Very high content (>12.5 meq/100 g) Mangos  Dried fruits (dates, prunes) Meats  Nuts Ground beef  Avocados Steak  Bran cereals Pork  Wheat germ Veal  Lima beans Lamb

## 2019-08-22 ENCOUNTER — Encounter: Payer: Self-pay | Admitting: Family Medicine

## 2019-08-22 ENCOUNTER — Telehealth (INDEPENDENT_AMBULATORY_CARE_PROVIDER_SITE_OTHER): Payer: BC Managed Care – PPO | Admitting: Family Medicine

## 2019-08-22 VITALS — Ht 64.0 in | Wt 186.0 lb

## 2019-08-22 DIAGNOSIS — Z09 Encounter for follow-up examination after completed treatment for conditions other than malignant neoplasm: Secondary | ICD-10-CM | POA: Diagnosis not present

## 2019-08-22 DIAGNOSIS — R109 Unspecified abdominal pain: Secondary | ICD-10-CM | POA: Diagnosis not present

## 2019-08-22 DIAGNOSIS — E876 Hypokalemia: Secondary | ICD-10-CM

## 2019-08-22 DIAGNOSIS — K861 Other chronic pancreatitis: Secondary | ICD-10-CM

## 2019-08-22 MED ORDER — PANTOPRAZOLE SODIUM 40 MG PO TBEC: 40.0000 mg | DELAYED_RELEASE_TABLET | Freq: Two times a day (BID) | ORAL | 3 refills | Status: DC

## 2019-08-22 NOTE — Assessment & Plan Note (Signed)
>  40 minutes spent in face to face time with patient, >50% spent in counselling or coordination of care.

## 2019-08-23 ENCOUNTER — Other Ambulatory Visit (INDEPENDENT_AMBULATORY_CARE_PROVIDER_SITE_OTHER): Payer: BC Managed Care – PPO

## 2019-08-23 ENCOUNTER — Other Ambulatory Visit: Payer: Self-pay

## 2019-08-23 DIAGNOSIS — R109 Unspecified abdominal pain: Secondary | ICD-10-CM | POA: Diagnosis not present

## 2019-08-23 DIAGNOSIS — K861 Other chronic pancreatitis: Secondary | ICD-10-CM

## 2019-08-23 LAB — CBC WITH DIFFERENTIAL/PLATELET
Basophils Absolute: 0.1 10*3/uL (ref 0.0–0.1)
Basophils Relative: 1.1 % (ref 0.0–3.0)
Eosinophils Absolute: 0.2 10*3/uL (ref 0.0–0.7)
Eosinophils Relative: 4.3 % (ref 0.0–5.0)
HCT: 42.5 % (ref 36.0–46.0)
Hemoglobin: 14.1 g/dL (ref 12.0–15.0)
Lymphocytes Relative: 37.5 % (ref 12.0–46.0)
Lymphs Abs: 2.2 10*3/uL (ref 0.7–4.0)
MCHC: 33.2 g/dL (ref 30.0–36.0)
MCV: 86.4 fl (ref 78.0–100.0)
Monocytes Absolute: 0.3 10*3/uL (ref 0.1–1.0)
Monocytes Relative: 5.8 % (ref 3.0–12.0)
Neutro Abs: 3 10*3/uL (ref 1.4–7.7)
Neutrophils Relative %: 51.3 % (ref 43.0–77.0)
Platelets: 305 10*3/uL (ref 150.0–400.0)
RBC: 4.92 Mil/uL (ref 3.87–5.11)
RDW: 14.1 % (ref 11.5–15.5)
WBC: 5.8 10*3/uL (ref 4.0–10.5)

## 2019-08-23 LAB — URINALYSIS, ROUTINE W REFLEX MICROSCOPIC
Bilirubin Urine: NEGATIVE
Hgb urine dipstick: NEGATIVE
Ketones, ur: NEGATIVE
Nitrite: NEGATIVE
RBC / HPF: NONE SEEN (ref 0–?)
Specific Gravity, Urine: 1.015 (ref 1.000–1.030)
Total Protein, Urine: NEGATIVE
Urine Glucose: NEGATIVE
Urobilinogen, UA: 0.2 (ref 0.0–1.0)
pH: 7.5 (ref 5.0–8.0)

## 2019-08-23 NOTE — Addendum Note (Signed)
Addended by: Ellamae Sia on: 08/23/2019 11:44 AM   Modules accepted: Orders

## 2019-08-31 ENCOUNTER — Other Ambulatory Visit: Payer: Self-pay

## 2019-08-31 ENCOUNTER — Ambulatory Visit (INDEPENDENT_AMBULATORY_CARE_PROVIDER_SITE_OTHER): Payer: BC Managed Care – PPO | Admitting: Internal Medicine

## 2019-08-31 ENCOUNTER — Encounter: Payer: Self-pay | Admitting: Internal Medicine

## 2019-08-31 VITALS — BP 110/80 | HR 111 | Temp 97.9°F | Ht 64.0 in | Wt 184.0 lb

## 2019-08-31 DIAGNOSIS — Z79891 Long term (current) use of opiate analgesic: Secondary | ICD-10-CM

## 2019-08-31 DIAGNOSIS — K859 Acute pancreatitis without necrosis or infection, unspecified: Secondary | ICD-10-CM | POA: Diagnosis not present

## 2019-08-31 DIAGNOSIS — Z1159 Encounter for screening for other viral diseases: Secondary | ICD-10-CM

## 2019-08-31 DIAGNOSIS — Z1211 Encounter for screening for malignant neoplasm of colon: Secondary | ICD-10-CM

## 2019-08-31 DIAGNOSIS — R935 Abnormal findings on diagnostic imaging of other abdominal regions, including retroperitoneum: Secondary | ICD-10-CM

## 2019-08-31 DIAGNOSIS — R109 Unspecified abdominal pain: Secondary | ICD-10-CM | POA: Diagnosis not present

## 2019-08-31 MED ORDER — NA SULFATE-K SULFATE-MG SULF 17.5-3.13-1.6 GM/177ML PO SOLN: 1.0000 | Freq: Once | ORAL | 0 refills | Status: AC

## 2019-08-31 NOTE — Patient Instructions (Signed)
You have been scheduled for an endoscopy and colonoscopy. Please follow the written instructions given to you at your visit today. Please pick up your prep supplies at the pharmacy within the next 1-3 days. If you use inhalers (even only as needed), please bring them with you on the day of your procedure. Your physician has requested that you go to www.[email or web address].com and enter the access code given to you at your visit today. This web site gives a general overview about your procedure. However, you should still follow specific instructions given to you by our office regarding your preparation for the procedure.   You will be due for an MRI of the pancreas in 6 months.  I will call you to schedule this.

## 2019-09-01 ENCOUNTER — Encounter: Payer: Self-pay | Admitting: Internal Medicine

## 2019-09-01 NOTE — Progress Notes (Signed)
HISTORY OF PRESENT ILLNESS:  Melissa Allen is a pleasant 53 y.o. female with multiple medical problems including chronic pain syndrome on narcotics who presents today for follow-up after being hospitalized with mild pancreatitis.  Previous patient of Dr. Olevia Perches, though she has not been seen in this office in many years.  Issues with pancreatitis state back to her late teenage years.  At that time for complaints of abdominal pain and elevated lipase she underwent ERCP which was negative.  She also has a history of post ERCP pancreatitis elsewhere.  Patient's recent hospitalization in early November was with left upper quadrant pain.  CT imaging showed inflammation of the tail the pancreas.  Small cyst noted.  Large cyst of liver also noted, though this is chronic.  MRI with MRCP was subsequently performed.  The patient is status post cholecystectomy.  No choledocholithiasis or biliary ductal dilation.  She has multiple hepatic cysts.  The dominant cyst is located in the anterior right hepatic lobe measuring 8.1 cm.  In terms of her pancreas, mild residual peripancreatic edema in the region of the tail.  A cystic lesion within the pancreatic head measured approximately 1.1 cm.  No suspicious postcontrast characteristics.  This has been seen on previous imaging and felt to be benign.  Patient tells me that she still has some postprandial abdominal pain.  She is experienced 10 pound weight loss.  She is very anxious in general and in particular over a family history of pancreatic cancer in her mother.  She describes her abdominal pain is in the midportion.  She does take NSAIDs.  She does take PPI twice daily which was only initiated at the time of her hospitalization.  She has not had colonoscopy in many many years.  She has multiple questions and concerns  REVIEW OF SYSTEMS:  All non-GI ROS negative unless otherwise stated in the HPI except for back pain  Past Medical History:  Diagnosis Date  . Allergy   .  Anginal pain ([place])    one episode 4-5 years ago-[facility]-nonspecific-no problems since-  . Anxiety   . Arthritis    multiple areas of arthritis- DDD  . Asthma    recent admit to ER 01/11/12 for exac of asthma  . Calcaneal fracture   . Duplicated ureter, right   . Fibromyalgia   . Hypertension   . Kidney stones   . Obesity (BMI 30.0-34.9)   . Pancreatitis 1986  . PONV (postoperative nausea and vomiting)   . Poor venous access    hard to start iv-  . 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 Shortness of breath dyspnea   . Wears glasses     Past Surgical History:  Procedure Laterality Date  . ABDOMINAL HYSTERECTOMY    . ANTERIOR LAT LUMBAR FUSION Left 08/17/2018   Procedure: LUMBAR THREE-FOUR ANTERIOR LATERAL INTERBODY FUSION;  Surgeon: Earnie Larsson, MD;  Location: [facility];  Service: Neurosurgery;  Laterality: Left;  . APPENDECTOMY  08/05/2017  . BLADDER SUSPENSION  03/01/2012   Procedure: TRANSVAGINAL TAPE (TVT) PROCEDURE;  Surgeon: Emily Filbert, MD;  Location: [facility];  Service: Gynecology;  Laterality: N/A;  . BUNIONECTOMY  1995   Right  . CHOLECYSTECTOMY  late 1980's      . CYSTOSCOPY  03/01/2012   Procedure: CYSTOSCOPY;  Surgeon: Emily Filbert, MD;  Location: [facility];  Service: Gynecology;  Laterality: N/A;  .  CYSTOSCOPY W/ URETERAL STENT PLACEMENT Left 12/15/2014   Procedure: CYSTOSCOPY, LEFT URETEROSCOPYU WITH STONE EXTRACTION;  Surgeon: Irine Seal, MD;  Location: [facility];  Service: Urology;  Laterality: Left;  . CYSTOSCOPY W/ URETERAL STENT PLACEMENT Left 10/02/2016   Procedure: CYSTOSCOPY WITH RETROGRADE PYELOGRAM/URETERAL STENT PLACEMENT ureteroscopy, basket extraction;  Surgeon: Cleon Gustin, MD;  Location: [facility];  Service: Urology;  Laterality: Left;  . HARDWARE REMOVAL N/A 12/22/2016   Procedure: Lumbar Four-Five Removal of Hardware;  Surgeon: Earnie Larsson, MD;  Location: [facility];  Service:  Neurosurgery;  Laterality: N/A;  . INCISION AND DRAINAGE ABSCESS Left 07/19/2013   Procedure: INCISION AND DRAINAGE LEFT LOWER EXTERMITY HEMATOMA;  Surgeon: Imogene Burn. Georgette Dover, MD;  Location: [facility];  Service: General;  Laterality: Left;  Excision left lower leg mass  . KNEE CARTILAGE SURGERY     left  . KNEE SURGERY Left    x 2, Murphy/Sue, corrected Patella displacement   . LAMINECTOMY    . LAPAROSCOPIC APPENDECTOMY N/A 08/05/2017   Procedure: APPENDECTOMY LAPAROSCOPIC;  Surgeon: Kinsinger, Arta Bruce, MD;  Location: [facility];  Service: General;  Laterality: N/A;  . LUMBAR LAMINECTOMY  10/06   L4-5, Ray  . LUMBAR PERCUTANEOUS PEDICLE SCREW 1 LEVEL Left 08/17/2018   Procedure: LUMBAR THREE-FOUR LUMBAR PERCUTANEOUS PEDICLE SCREW;  Surgeon: Earnie Larsson, MD;  Location: [facility];  Service: Neurosurgery;  Laterality: Left;  . R compressed pronator Right    Sypher, R arm  . RESECTION DISTAL CLAVICAL Right 06/30/2017   Procedure: RIGHT RESECTION DISTAL CLAVICAL;  Surgeon: Melrose Nakayama, MD;  Location: [facility];  Service: Orthopedics;  Laterality: Right;  . SHOULDER ACROMIOPLASTY Right 06/30/2017   Procedure: RIGHT SHOULDER ACROMIOPLASTY;  Surgeon: Melrose Nakayama, MD;  Location: [facility];  Service: Orthopedics;  Laterality: Right;  . SHOULDER ARTHROSCOPY Right 06/30/2017   Procedure: ARTHROSCOPY DEBRIDEMENT RIGHT SHOULDER;  Surgeon: Melrose Nakayama, MD;  Location: [facility];  Service: Orthopedics;  Laterality: Right;  . SPINAL FUSION    . TENDON REPAIR  2013  . TENDON REPAIR Right 02/01/12  . TONSILLECTOMY  age 38    [facility]  reports that she has never smoked. She has never used smokeless tobacco. She reports current alcohol use of about 7.0 standard drinks of alcohol per week. She reports that she does not use drugs.  family history includes Breast cancer in an other family member; Cancer in her mother; Colon cancer in her maternal grandmother and paternal  grandfather; Colon polyps in her father; Diabetes in an other family member; Heart disease (age of onset: 49) in her father; Ovarian cancer in an other family member; Uterine cancer in an other family member.  Allergies  Allergen Reactions  . Latex Itching, Swelling and Dermatitis    SWELLING FROM FACE MASK RING AFTER LAST SURGERY   . Lisinopril Cough       . Codeine Nausea And Vomiting  . Hydrocodone Itching  . Tape Rash    TOLERATES PAPER TAPE ONLY NO PINK SURGICAL TAPE EITHER, EKG Leads        PHYSICAL EXAMINATION: Vital signs: BP 110/80   Pulse (!) 111   Temp 97.9 F (36.6 C)   Ht 5\' 4"  (1.626 m)   Wt 184 lb (83.5 kg)   LMP 12/21/2011   BMI 31.58 kg/m   Constitutional: Pleasant, generally well-appearing, no acute distress Psychiatric: alert and oriented x3, cooperative.  Somewhat anxious Eyes: extraocular movements intact, anicteric, conjunctiva pink Mouth: Mask Neck:  supple no lymphadenopathy Cardiovascular: heart regular rate and rhythm, no murmur Lungs: clear to auscultation bilaterally Abdomen: soft, definite abdominal wall tenderness in the mid upper abdomen, nondistended, no obvious ascites, no peritoneal signs, normal bowel sounds, no organomegaly Rectal: Omitted Extremities: no clubbing, cyanosis, or lower extremity edema bilaterally Skin: no lesions on visible extremities Neuro: No focal deficits.  Cranial nerves intact  ASSESSMENT:  1.  Recent bout of mild pancreatitis of the tail. 2.  Chronic small pancreatic cyst.  Stable.  Favor benign 3.  Postprandial abdominal pain.  Possibly related to pancreas.  Rule out ulcer disease in the patient on chronic NSAIDs 4.  Screening colonoscopy.  Overdue.  Motivated 5.  Chronic pain syndrome   PLAN:  1.  Continue PPI therapy 2.  Schedule upper endoscopy to evaluate postprandial abdominal pain and weight loss.The nature of the procedure, as well as the risks, benefits, and alternatives were carefully and  thoroughly reviewed with the patient. Ample time for discussion and questions allowed. The patient understood, was satisfied, and agreed to proceed. 3.  Schedule colonoscopy for colon cancer screening.The nature of the procedure, as well as the risks, benefits, and alternatives were carefully and thoroughly reviewed with the patient. Ample time for discussion and questions allowed. The patient understood, was satisfied, and agreed to proceed. 4.  Repeat laboratories today including amylase and lipase 5.  Repeat MRI of the pancreas in about 6 months to assure stability of the cystic lesion, particular given her family history. 6.  Ongoing general medical care with her PCP 40 minutes was spent face-to-face with the patient Approximately 40 minutes spent with the patient.  Greater than 50% the time used for counseling regarding her above listed issues, their work-up, and management

## 2019-09-23 ENCOUNTER — Encounter: Payer: Self-pay | Admitting: Internal Medicine

## 2019-09-26 ENCOUNTER — Other Ambulatory Visit: Payer: Self-pay | Admitting: Internal Medicine

## 2019-09-26 ENCOUNTER — Ambulatory Visit (INDEPENDENT_AMBULATORY_CARE_PROVIDER_SITE_OTHER): Payer: BC Managed Care – PPO

## 2019-09-26 DIAGNOSIS — Z1159 Encounter for screening for other viral diseases: Secondary | ICD-10-CM

## 2019-09-27 LAB — SARS CORONAVIRUS 2 (TAT 6-24 HRS): SARS Coronavirus 2: NEGATIVE

## 2019-09-28 ENCOUNTER — Ambulatory Visit (AMBULATORY_SURGERY_CENTER): Payer: BC Managed Care – PPO | Admitting: Internal Medicine

## 2019-09-28 ENCOUNTER — Other Ambulatory Visit: Payer: Self-pay

## 2019-09-28 ENCOUNTER — Encounter: Payer: Self-pay | Admitting: Internal Medicine

## 2019-09-28 VITALS — BP 99/54 | HR 73 | Temp 98.1°F | Resp 14 | Ht 64.0 in | Wt 184.0 lb

## 2019-09-28 DIAGNOSIS — Z1211 Encounter for screening for malignant neoplasm of colon: Secondary | ICD-10-CM | POA: Diagnosis not present

## 2019-09-28 DIAGNOSIS — D120 Benign neoplasm of cecum: Secondary | ICD-10-CM | POA: Diagnosis not present

## 2019-09-28 DIAGNOSIS — K859 Acute pancreatitis without necrosis or infection, unspecified: Secondary | ICD-10-CM

## 2019-09-28 DIAGNOSIS — K2100 Gastro-esophageal reflux disease with esophagitis, without bleeding: Secondary | ICD-10-CM | POA: Diagnosis not present

## 2019-09-28 DIAGNOSIS — D123 Benign neoplasm of transverse colon: Secondary | ICD-10-CM

## 2019-09-28 DIAGNOSIS — R109 Unspecified abdominal pain: Secondary | ICD-10-CM

## 2019-09-28 DIAGNOSIS — R935 Abnormal findings on diagnostic imaging of other abdominal regions, including retroperitoneum: Secondary | ICD-10-CM

## 2019-09-28 MED ORDER — SODIUM CHLORIDE 0.9 % IV SOLN: 500.0000 mL | Freq: Once | INTRAVENOUS | Status: DC

## 2019-09-28 NOTE — Progress Notes (Signed)
Called to room to assist during endoscopic procedure.  Patient ID and intended procedure confirmed with present staff. Received instructions for my participation in the procedure from the performing physician.  

## 2019-09-28 NOTE — Progress Notes (Signed)
Pt's states no medical or surgical changes since previsit or office visit. 

## 2019-09-28 NOTE — Op Note (Signed)
[facility] Patient Name: Melissa Allen Procedure Date: 09/28/2019 3:32 PM MRN: [id number] Endoscopist: Docia Chuck. Henrene Pastor , MD Age: 54 Referring MD:  Date of Birth: 22-Sep-1965 Gender: Female Account #: [id number] Procedure:                Colonoscopy with cold snare polypectomy x 1 Indications:              Screening for colorectal malignant neoplasm Medicines:                Monitored Anesthesia Care Procedure:                Pre-Anesthesia Assessment:                           - Prior to the procedure, a History and Physical                            was performed, and patient medications and                            allergies were reviewed. The patient's tolerance of                            previous anesthesia was also reviewed. The risks                            and benefits of the procedure and the sedation                            options and risks were discussed with the patient.                            All questions were answered, and informed consent                            was obtained. Prior Anticoagulants: The patient has                            taken no previous anticoagulant or antiplatelet                            agents. ASA Grade Assessment: II - A patient with                            mild systemic disease. After reviewing the risks                            and benefits, the patient was deemed in                            satisfactory condition to undergo the procedure.                           After obtaining informed consent, the colonoscope  was passed under direct vision. Throughout the                            procedure, the patient's blood pressure, pulse, and                            oxygen saturations were monitored continuously. The                            Colonoscope was introduced through the anus and                            advanced to the the cecum, identified by    appendiceal orifice and ileocecal valve. The                            ileocecal valve, appendiceal orifice, and rectum                            were photographed. The quality of the bowel                            preparation was excellent. The colonoscopy was                            performed without difficulty. The patient tolerated                            the procedure well. The bowel preparation used was                            SUPREP via split dose instruction. Scope In: 3:42:56 PM Scope Out: 3:57:25 PM Scope Withdrawal Time: 0 hours 11 minutes 38 seconds  Total Procedure Duration: 0 hours 14 minutes 29 seconds  Findings:                 Three polyps were found in the transverse colon and                            cecum. The polyps were 3 to 4 mm in size. These                            polyps were removed with a cold snare. Resection                            and retrieval were complete.                           The exam was otherwise without abnormality on                            direct and retroflexion views. Complications:            No immediate complications. Estimated blood loss:  None. Estimated Blood Loss:     Estimated blood loss: none. Impression:               - Three 3 to 4 mm polyps in the transverse colon                            and in the cecum, removed with a cold snare.                            Resected and retrieved.                           - The examination was otherwise normal on direct                            and retroflexion views. Recommendation:           - Repeat colonoscopy in 3 or 5 years for                            surveillance, pending final pathology.                           - Patient has a contact number available for                            emergencies. The signs and symptoms of potential                            delayed complications were discussed with the                             patient. Return to normal activities tomorrow.                            Written discharge instructions were provided to the                            patient.                           - Resume previous diet.                           - Continue present medications.                           - Await pathology results. Docia Chuck. Henrene Pastor, MD 09/28/2019 4:01:15 PM This report has been signed electronically.

## 2019-09-28 NOTE — Op Note (Signed)
[facility] Patient Name: Melissa Allen Procedure Date: 09/28/2019 3:31 PM MRN: [id number] Endoscopist: Docia Chuck. Henrene Pastor , MD Age: 54 Referring MD:  Date of Birth: 1966-03-21 Gender: Female Account #: [id number] Procedure:                Upper GI endoscopy Indications:              Abdominal pain Medicines:                Monitored Anesthesia Care Procedure:                Pre-Anesthesia Assessment:                           - Prior to the procedure, a History and Physical                            was performed, and patient medications and                            allergies were reviewed. The patient's tolerance of                            previous anesthesia was also reviewed. The risks                            and benefits of the procedure and the sedation                            options and risks were discussed with the patient.                            All questions were answered, and informed consent                            was obtained. Prior Anticoagulants: The patient has                            taken no previous anticoagulant or antiplatelet                            agents. ASA Grade Assessment: II - A patient with                            mild systemic disease. After reviewing the risks                            and benefits, the patient was deemed in                            satisfactory condition to undergo the procedure.                           After obtaining informed consent, the endoscope was  passed under direct vision. Throughout the                            procedure, the patient's blood pressure, pulse, and                            oxygen saturations were monitored continuously. The                            Endoscope was introduced through the mouth, and                            advanced to the second part of duodenum. The upper                            GI endoscopy was accomplished without  difficulty.                            The patient tolerated the procedure well. Scope In: Scope Out: Findings:                 LA Grade A (one or more mucosal breaks less than 5                            mm, not extending between tops of 2 mucosal folds)                            esophagitis was found.                           The stomach was normal.                           The examined duodenum was normal.                           The cardia and gastric fundus were normal on                            retroflexion. Complications:            No immediate complications. Estimated Blood Loss:     Estimated blood loss: none. Impression:               - LA Grade A reflux esophagitis.                           - Normal stomach.                           - Normal examined duodenum.                           - No specimens collected. Recommendation:           1. Reflux precautions  2. Weight loss                           3. Continue pantoprazole 40 mg twice daily. Take 30                            to 60 minutes before breakfast and 30 to 60 minutes                            before your evening meal                           4. Office follow-up with Dr. Henrene Pastor in 3 months. Docia Chuck. Henrene Pastor, MD 09/28/2019 4:12:30 PM This report has been signed electronically.

## 2019-09-28 NOTE — Progress Notes (Signed)
To PACU, VSS. Report to [email or web address] 

## 2019-09-28 NOTE — Patient Instructions (Signed)
Please read handouts provided. Reflux precautions. Weight loss. Continue pantoprazole 40 mg twice daily, take 30 to 60 minutes before breakfast and 30 to 60 minutes before evening meal. Office follow-up with Dr. Henrene Pastor in 3 months. Weight loss. Continue present medications. Await pathology results.     YOU HAD AN ENDOSCOPIC PROCEDURE TODAY AT [facility]:   Refer to the procedure report that was given to you for any specific questions about what was found during the examination.  If the procedure report does not answer your questions, please call your gastroenterologist to clarify.  If you requested that your care partner not be given the details of your procedure findings, then the procedure report has been included in a sealed envelope for you to review at your convenience later.  YOU SHOULD EXPECT: Some feelings of bloating in the abdomen. Passage of more gas than usual.  Walking can help get rid of the air that was put into your GI tract during the procedure and reduce the bloating. If you had a lower endoscopy (such as a colonoscopy or flexible sigmoidoscopy) you may notice spotting of blood in your stool or on the toilet paper. If you underwent a bowel prep for your procedure, you may not have a normal bowel movement for a few days.  Please Note:  You might notice some irritation and congestion in your nose or some drainage.  This is from the oxygen used during your procedure.  There is no need for concern and it should clear up in a day or so.  SYMPTOMS TO REPORT IMMEDIATELY:   Following lower endoscopy (colonoscopy or flexible sigmoidoscopy):  Excessive amounts of blood in the stool  Significant tenderness or worsening of abdominal pains  Swelling of the abdomen that is new, acute  Fever of 100F or higher   Following upper endoscopy (EGD)  Vomiting of blood or coffee ground material  New chest pain or pain under the shoulder blades  Painful or persistently  difficult swallowing  New shortness of breath  Fever of 100F or higher  Black, tarry-looking stools  For urgent or emergent issues, a gastroenterologist can be reached at any hour by calling [phone].   DIET:  We do recommend a small meal at first, but then you may proceed to your regular diet.  Drink plenty of fluids but you should avoid alcoholic beverages for 24 hours.  ACTIVITY:  You should plan to take it easy for the rest of today and you should NOT DRIVE or use heavy machinery until tomorrow (because of the sedation medicines used during the test).    FOLLOW UP: Our staff will call the number listed on your records 48-72 hours following your procedure to check on you and address any questions or concerns that you may have regarding the information given to you following your procedure. If we do not reach you, we will leave a message.  We will attempt to reach you two times.  During this call, we will ask if you have developed any symptoms of COVID 19. If you develop any symptoms (ie: fever, flu-like symptoms, shortness of breath, cough etc.) before then, please call [phone].  If you test positive for Covid 19 in the 2 weeks post procedure, please call and report this information to [place].    If any biopsies were taken you will be contacted by phone or by letter within the next 1-3 weeks.  Please call us at [phone] if you have not heard  about the biopsies in 3 weeks.    SIGNATURES/CONFIDENTIALITY: You and/or your care partner have signed paperwork which will be entered into your electronic medical record.  These signatures attest to the fact that that the information above on your After Visit Summary has been reviewed and is understood.  Full responsibility of the confidentiality of this discharge information lies with you and/or your care-partner.

## 2019-09-29 ENCOUNTER — Telehealth: Payer: Self-pay | Admitting: Internal Medicine

## 2019-09-29 NOTE — Telephone Encounter (Signed)
Pt called c/o of "blisters on her lips" post Endo colon yesterday. Shes having lots of discomfort with this. She did have a reaction to the oxygen face mask with a ring around her face with  prior surgery last year. Spoke with Gershon Crane CRNA who said that the patient had a very small pinched ulcer under her top lip after the bite block was removed and that they slipped it in just prior to the EGD which was done last. Will let Dr. Lyndel Safe know as he is the on call Doctor this afternoon.

## 2019-09-29 NOTE — Telephone Encounter (Signed)
Patient is calling says she had a double procedure yesterday and she is experiencing blisters around her mouth area

## 2019-09-29 NOTE — Telephone Encounter (Signed)
Discussed over the phone with pt in detail  Looks like some reaction (to ? O2 mask)- blisters around the mouth. No SOB or any fever. Has benadryl tabs at home. Take 25mg  x 1 now, then rpt in 6hrs. Can use aquafore cream PRN Call back if not better in 24hrs.  RG

## 2019-09-30 ENCOUNTER — Telehealth: Payer: Self-pay | Admitting: Internal Medicine

## 2019-09-30 ENCOUNTER — Telehealth: Payer: Self-pay

## 2019-09-30 ENCOUNTER — Other Ambulatory Visit: Payer: Self-pay

## 2019-09-30 MED ORDER — MAGIC MOUTHWASH W/LIDOCAINE: 10.0000 mL | Freq: Three times a day (TID) | ORAL | 0 refills | Status: DC | PRN

## 2019-09-30 NOTE — Telephone Encounter (Signed)
She is welcome to try Magic mouthwash.  If the problem worsens, she should probably see her PCP

## 2019-09-30 NOTE — Telephone Encounter (Signed)
Pt states that after the procedure she has developed blisters on her tongue and on the inside of her lips. Reports her lips are swollen and feel tight, she thinks they may crack and bleed. She has been using aquaphor on her lips. Her tongue has areas that are red and sore. Thinks she may have had an allergic reaction to the mouthpiece or mask. She was instructed to try benadryl yesterday by Dr. Lyndel Safe. Pt states she could not tell any difference from the benadryl. Pt wonders if magic mouthwash may help. Please advise.

## 2019-09-30 NOTE — Telephone Encounter (Signed)
Bri has Dr. Lyndel Safe.

## 2019-09-30 NOTE — Telephone Encounter (Signed)
Left detailed message for pt. Script called to [facility] for magic mouthwash.

## 2019-09-30 NOTE — Telephone Encounter (Signed)
  Follow up Call-  Call back number 09/28/2019  Post procedure Call Back phone  # [phone]  Permission to leave phone message Yes  Some recent data might be hidden     Patient questions:  Do you have a fever, pain , or abdominal swelling? Yes.   Pain Score  4 *  Have you tolerated food without any problems? Yes.    Have you been able to return to your normal activities? Yes.    Do you have any questions about your discharge instructions: Diet   No. Medications  No. Follow up visit  No.  Do you have questions or concerns about your Care? Yes.    Patient husband states that the mouth guard from the procedure cut her lips and left a blister. Dr. Lyndel Safe has been informed of this and has already spoken with her about his recommendations. Instructed him or her to give [place] a call back if she continues to have problems.    Actions: * If pain score is 4 or above: No action needed, pain <4.  1. Have you developed a fever since your procedure? No  2.   Have you had an respiratory symptoms (SOB or cough) since your procedure? No  3.   Have you tested positive for COVID 19 since your procedure? No  4.   Have you had any family members/close contacts diagnosed with the COVID 19 since your procedure?  No   If yes to any of these questions please route to Joylene John, RN and Alphonsa Gin, RN.

## 2019-10-03 ENCOUNTER — Telehealth: Payer: Self-pay | Admitting: Family Medicine

## 2019-10-03 NOTE — Telephone Encounter (Signed)
It sounds like she needs to be seen.  Can someone in our office see her tomorrow?

## 2019-10-03 NOTE — Telephone Encounter (Signed)
Pt called because she said she was told to contact her PCP if her reaction hadn't got better, pt had a reaction to the mouthpiece that keeps your mouth open when she was at [facility] on Wednesday, she had a allergic reaction and she has been taking benadryl but it has made her moth real dry. She has been using magic mouth wash as well that was sent in by Dr Blanch [facility] office. She wants to know what you think she should do.

## 2019-10-03 NOTE — Telephone Encounter (Signed)
In addition to the below messages. Patient reported that she is not in respiratory distress, however she does still have blisters present on the inside of her mouth/tongue, along with bruising on her lips; also, "her left eye continues to have swelling." Per the patient, "her symptoms are improving from last Wednesday". She is currently taking Benadryl, using magic mouthwash, & applying warm compresses to her eye to relieve symptoms.  Informed patient of the provider's recommendations below. An in-person visit has been scheduled for 10/04/2019 at 1 PM w/ Dr. Zigmund Daniel.  Patient is aware to seek the nearest ED or urgent care if respiratory distress or worsening symptoms occur prior to the appointment. She verbalized understanding and did not have any further questions or concerns before call ended.

## 2019-10-03 NOTE — Telephone Encounter (Signed)
TB-Can you please help with this pt for an appointment tomorrow? Plz advise/thx dmf

## 2019-10-04 ENCOUNTER — Other Ambulatory Visit: Payer: Self-pay

## 2019-10-04 ENCOUNTER — Encounter: Payer: Self-pay | Admitting: Family Medicine

## 2019-10-04 ENCOUNTER — Ambulatory Visit (INDEPENDENT_AMBULATORY_CARE_PROVIDER_SITE_OTHER): Payer: BC Managed Care – PPO | Admitting: Family Medicine

## 2019-10-04 VITALS — BP 110/80 | HR 118 | Temp 97.6°F | Ht 64.0 in | Wt 183.2 lb

## 2019-10-04 DIAGNOSIS — K861 Other chronic pancreatitis: Secondary | ICD-10-CM

## 2019-10-04 DIAGNOSIS — T7840XA Allergy, unspecified, initial encounter: Secondary | ICD-10-CM | POA: Insufficient documentation

## 2019-10-04 DIAGNOSIS — T7840XS Allergy, unspecified, sequela: Secondary | ICD-10-CM

## 2019-10-04 LAB — AMYLASE: Amylase: 60 U/L (ref 27–131)

## 2019-10-04 LAB — LIPASE: Lipase: 62 U/L — ABNORMAL HIGH (ref 11.0–59.0)

## 2019-10-04 MED ORDER — EPINEPHRINE 0.3 MG/0.3ML IJ SOAJ: 0.3000 mg | INTRAMUSCULAR | 1 refills | Status: AC | PRN

## 2019-10-04 NOTE — Assessment & Plan Note (Signed)
Update lipase/amylase
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
## 2019-10-04 NOTE — Assessment & Plan Note (Signed)
IgE to latex and total IgE levels ordered.  I am unable to order IgE to other components including PVC, silicone, etc. May need to have skin testing for additional items to find what is causing recurrent reactions.  She has had improvement with magic mouthwash and benadryl and I recommend she continue. I don't think she needs systemic steroids at this time.  Given trigger is unknown and reactions have been progressive in severity will send in epipen for her to keep on hand.

## 2019-10-04 NOTE — Patient Instructions (Addendum)
Unfortunately I do not have access to order some of the more advanced IGE panels but you may want to discuss IGE or skin testing for PVC, silicone, iodine, methylcellulose gel (surgical lubricant) and anesthetic drugs with the allergist.    I have ordered total IGE levels and latex IGE, we'll call you with these  results.   Continue magic mouthwash for now and benadryl as needed.

## 2019-10-05 ENCOUNTER — Encounter: Payer: Self-pay | Admitting: Internal Medicine

## 2019-10-05 ENCOUNTER — Encounter: Payer: Self-pay | Admitting: Family Medicine

## 2019-10-05 LAB — IGE: IgE (Immunoglobulin E), Serum: 31 kU/L (ref <=114)

## 2019-10-05 LAB — LATEX, IGE
CLASS: 0
Latex: <0.1 kU/L

## 2019-10-05 LAB — INTERPRETATION:
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
## 2019-10-06 ENCOUNTER — Ambulatory Visit (INDEPENDENT_AMBULATORY_CARE_PROVIDER_SITE_OTHER): Payer: BC Managed Care – PPO | Admitting: Family Medicine

## 2019-10-06 ENCOUNTER — Other Ambulatory Visit: Payer: Self-pay

## 2019-10-06 DIAGNOSIS — T7840XS Allergy, unspecified, sequela: Secondary | ICD-10-CM | POA: Diagnosis not present

## 2019-10-06 NOTE — Assessment & Plan Note (Signed)
She had a Endoscopy/Colonoscopy last week and has had a reaction to what she thinks is the materials used on her while she was out like the oxygen mask (not nasal canula) as her reaction reached her eyes. This is not the first time she has had a reaction to things of this nature but each time it worsens and she fears anaphylaxis.  Mouth  Had sores, felt like it was swelling shut.  Magic mouth really helped quite a bit. She still can't eat because it burns her blisters.   She spoke to the [place] office that Dr. Zigmund Daniel referred her to and they said that they do not do allergy testing for things of that nature that she would have to go to a place like [facility] for that. She also advised that when she was younger after having a child and her husband used condoms that she would react to those as well.    She has been evaluated by Margot Ables 3-days-ago and confirmed her eyes were ok. She wants to get to the bottom of why she has this. 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 She has been Benadryl 2 at a time and using magic mouthwash which have improved her Sx.  She would like referral to be STAT as well as you never know what will happen from moment to moment.  ? Due to a type of rubber, not just latex.  Referral urgently placed to allergist at [facility] per pt request.  Currently taking Benadryl and Magic Mouthwash.   She has an epi pen but has not had to use.

## 2019-11-01 ENCOUNTER — Ambulatory Visit: Payer: BC Managed Care – PPO | Admitting: Allergy & Immunology

## 2019-11-01 ENCOUNTER — Other Ambulatory Visit: Payer: Self-pay

## 2019-11-01 ENCOUNTER — Encounter: Payer: Self-pay | Admitting: Allergy & Immunology

## 2019-11-01 VITALS — BP 122/66 | HR 109 | Temp 97.9°F | Resp 18 | Ht 62.5 in | Wt 188.4 lb

## 2019-11-01 DIAGNOSIS — L239 Allergic contact dermatitis, unspecified cause: Secondary | ICD-10-CM | POA: Diagnosis not present

## 2019-11-01 DIAGNOSIS — J310 Chronic rhinitis: Secondary | ICD-10-CM

## 2019-11-01 MED ORDER — CETIRIZINE HCL 10 MG PO TABS: 10.0000 mg | ORAL_TABLET | Freq: Every day | ORAL | 1 refills | Status: DC

## 2019-11-01 MED ORDER — OLOPATADINE HCL 0.2 % OP SOLN: 1.0000 [drp] | Freq: Two times a day (BID) | OPHTHALMIC | 1 refills | Status: DC | PRN

## 2019-11-01 NOTE — Progress Notes (Signed)
NEW PATIENT  Date of Service/Encounter:  11/01/19  Referring provider: Lucille Passy, MD   Assessment:   Allergic contact dermatitis  Chronic rhinitis   Ms. Bezerra presents for an evaluation of what sounds to be contact dermatitis. This is always triggered by the use of oxygen masks, so I would conjecture that this is related to the plastic itself. I think she needs to undergo patch testing with our True Test patch series as well as the metal series and samples of plastic from various oxygen masks. She is gong to obtain samples of oxygen masks from the places where she has had reactions. Once she brings all of this in, we can cut small samples to place in the [facility]. In addition, we are going to obtain blood work to look for environmental allergies.   Plan/Recommendations:   1. Contact dermatitis - We need to some patch testing to look for chemical sensitivities, including plastics.  - These are placed on Monday and read on Wednesdays and Fridays. - Try to gather up some samples of the face masks so we can do some patch testing to the plastics/rubbers. - Also try to gather the mouth guard that you are concerned with as well.  - We can also do patch testing to look for other chemical sensitivities as well.  - Let's schedule this for a Monday in 4 weeks so we have time to gather what we need for the testing.    2. Chronic rhinitis  - We will get blood work to look for environmental allergy testing.  - Start taking: Zyrtec (cetirizine) 10mg  tablet once daily and Pataday (olopatadine) one drop per eye twice daily as needed - We will call you in 1-2 weeks with the results of the testing. - This will help [place] to guide future treatment.   3. Return in about 4 weeks (around 11/29/2019) for [facility] TESTING. This can be an in-person, a virtual Webex or a telephone follow up visit.  Subjective:   ZYRIANNA KOHN is a 54 y.o. female presenting today for evaluation of  Chief Complaint   Patient presents with  . Dermatitis    BAXLEY TULLO has a history of the following: Patient Active Problem List   Diagnosis Date Noted  . Allergic reaction 10/04/2019  . Encounter for examination following treatment at hospital 08/19/2019  . Abdominal pain 08/19/2019  . SIRS of non-infectious origin w acute organ dysfunction ([place]) 07/31/2019  . Hospital discharge follow-up 07/31/2019  . Pancreatic cyst 07/30/2019  . Acute on chronic respiratory failure with hypoxia ([place]) 07/21/2019  . Volume overload 07/21/2019  . Non-intractable vomiting   . Pancreatitis 07/18/2019  . Acute pancreatitis 07/18/2019  . Thermal burn 07/11/2019  . Asthma, chronic obstructive, with acute exacerbation ([place]) 07/11/2019  . Need for Tdap vaccination 07/11/2019  . Acute appendicitis 08/05/2017  . Painful orthopaedic hardware ([place]) 12/22/2016  . Ureteric stone 10/02/2016  . Hot flashes 01/08/2016  . Hypokalemia 07/31/2015  . Spinal stenosis, lumbar region, 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  . Degenerative spondylolisthesis 05/01/2015  . Insomnia 09/30/2013  . HEADACHE 04/26/2010  . Fibromyalgia 01/25/2010  . Anxiety state 01/22/2010  . Depression, recurrent ([place]) 01/22/2010  . REFLUX GASTRITIS 01/22/2010  . 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 NEPHROLITHIASIS, HX OF 01/22/2010  . Essential hypertension 01/01/2009  . Palpitations 01/11/2008  . Asthma 11/26/2007  . GERD 11/26/2007  . PANCREATITIS, CHRONIC 11/26/2007  . RENAL CALCULUS 11/26/2007  . Arthropathy 11/26/2007  . Allergic rhinitis  11/24/2007    History obtained from: chart review and patient.  MARYELA NELLUM was referred by Lucille Passy, MD.     Kimberleigh is a 54 y.o. female presenting for an evaluation of allergic reactions. She has a complicated past medical history of hypertension, fibromyalgia, multiple back surgeries on pain management, history of pancreatitis, cholecystectomy and appendectomy.  She was in the hospital for  pancreatitis in November 2020 and was admitted with SIRS. There was never a cause of her pancreatitis noted. She had another hospitalization in January 20th. She had an endoscopy and a colonoscopy. She had a cyst discovered on her pancreas in November 2020. During the November hospitalization, she was over-resuscitated with fluids. She took the nasal oxygen off and put on the full mask re-breather. Over the next day, she had a distinct rash around her face and this remained irritated for a couple of weeks.   During one of her spinal fusions, she had a ring on her face as well where the oxygen mask was placed. They seem to get more severe as they happen, which is why she is here today. Her episode in January was particularly severe. She had swelling in January around her mouth with tongue swelling. She also reports some mouth sores, although this is not confirmed in review of the medical chart. She denies any new medications at that hospitalization. She had blistering and tongue fullness. She was not given steroids the next time that it happened. She did receive magic mouthwash with some improvement.   She also tells me that her skin is sensitive to a variety of other things as well. She has never been patch tested. She does get injections in her back for pain control and denies any issues with these. She is very anxious today and tells me that she does "not want to get COVID and then have a severe reaction with the masks".   She does have some environmental allergy symptoms occasionally during the year. Dust is a particular trigger for her. She has never been allergy tested to her knowledge.   She has no history of asthma or food allergies. Otherwise, there is no history of other atopic diseases, including asthma, food allergies, drug allergies, stinging insect allergies, eczema or urticaria. There is no significant infectious history. Vaccinations are up to date.    Past Medical History: Patient Active  Problem List   Diagnosis Date Noted  . Allergic reaction 10/04/2019  . Encounter for examination following treatment at hospital 08/19/2019  . Abdominal pain 08/19/2019  . SIRS of non-infectious origin w acute organ dysfunction ([place]) 07/31/2019  . Hospital discharge follow-up 07/31/2019  . Pancreatic cyst 07/30/2019  . Acute on chronic respiratory failure with hypoxia ([place]) 07/21/2019  . Volume overload 07/21/2019  . Non-intractable vomiting   . Pancreatitis 07/18/2019  . Acute pancreatitis 07/18/2019  . Thermal burn 07/11/2019  . Asthma, chronic obstructive, with acute exacerbation ([place]) 07/11/2019  . Need for Tdap vaccination 07/11/2019  . Acute appendicitis 08/05/2017  . Painful orthopaedic hardware ([place]) 12/22/2016  . Ureteric stone 10/02/2016  . Hot flashes 01/08/2016  . Hypokalemia 07/31/2015  . Spinal stenosis, lumbar region, 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  . Degenerative spondylolisthesis 05/01/2015  . Insomnia 09/30/2013  . HEADACHE 04/26/2010  . Fibromyalgia 01/25/2010  . Anxiety state 01/22/2010  . Depression, recurrent ([place]) 01/22/2010  . REFLUX GASTRITIS 01/22/2010  . 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 NEPHROLITHIASIS, HX OF 01/22/2010  . Essential hypertension 01/01/2009  . Palpitations 01/11/2008  .  Asthma 11/26/2007  . GERD 11/26/2007  . PANCREATITIS, CHRONIC 11/26/2007  . RENAL CALCULUS 11/26/2007  . Arthropathy 11/26/2007  . Allergic rhinitis 11/24/2007    Medication List:  Allergies as of 11/01/2019      Reactions   Latex Itching, Swelling, Dermatitis   SWELLING FROM FACE MASK RING AFTER LAST SURGERY   Lisinopril Cough      Codeine Nausea And Vomiting   Hydrocodone Itching   Tape Rash   TOLERATES PAPER TAPE ONLY NO PINK SURGICAL TAPE EITHER, EKG Leads       Medication List       Accurate as of November 01, 2019  5:56 PM. If you have any questions, ask your nurse or doctor.        albuterol 108 (90 Base) MCG/ACT inhaler Commonly known  as: ProAir HFA INHALE TWO PUFFS EVERY FOUR HOURS AS NEEDED SHORTNESS OF BREATH What changed:   how much to take  how to take this  when to take this  reasons to take this  additional instructions   carvedilol 3.125 MG tablet Commonly known as: COREG Take 1 tablet (3.125 mg total) by mouth 2 (two) times daily with a meal.   cetirizine 10 MG tablet Commonly known as: ZYRTEC Take 1 tablet (10 mg total) by mouth daily. Started by: Valentina Shaggy, MD   cyclobenzaprine 5 MG tablet Commonly known as: FLEXERIL Take 5 mg by mouth 3 (three) times daily as needed for muscle spasms.   dicyclomine 10 MG capsule Commonly known as: BENTYL Take 1 capsule (10 mg total) by mouth 3 (three) times daily before meals.   DULoxetine 60 MG capsule Commonly known as: CYMBALTA Take 90 mg by mouth daily.   EPINEPHrine 0.3 mg/0.3 mL Soaj injection Commonly known as: EPI-PEN Inject 0.3 mLs (0.3 mg total) into the muscle as needed for anaphylaxis.   estrogens (conjugated) 0.3 MG tablet Commonly known as: Premarin TAKE 1 TABLET BY MOUTH DAILY (TAKE FOR 21 DAYS THEN DO NOT TAKE FOR 7DAYS) What changed:   how much to take  how to take this  when to take this  additional instructions   fluticasone 50 MCG/ACT nasal spray Commonly known as: FLONASE USE 2 SPRAYS INTO EACH NOSTRIL ONCE DAILY AS DIRECTED What changed:   how much to take  how to take this  when to take this  additional instructions   ibuprofen 200 MG tablet Commonly known as: ADVIL You can safely take 3 tablets every 6 hours as needed for pain.  He can alternate this with plain Tylenol.  You can also alternate with the prescribed narcotic.  I would recommend to use the Tylenol and ibuprofen as your first line pain medications. What changed:   how much to take  how to take this  when to take this  reasons to take this   losartan 50 MG tablet Commonly known as: COZAAR TAKE 1 TABLET BY MOUTH ONCE DAILY    magic mouthwash w/lidocaine Soln Take 10 mLs by mouth 3 (three) times daily as needed for mouth pain (swish and spit 3 times a day as needed for tongue pain).   Olopatadine HCl 0.2 % Soln Commonly known as: Pataday Place 1 drop into both eyes 2 (two) times daily as needed. Started by: Valentina Shaggy, MD   ondansetron 4 MG tablet Commonly known as: ZOFRAN Take 1 tablet (4 mg total) by mouth every 6 (six) hours as needed for nausea.   oxymorphone 5 MG tablet Commonly  known as: OPANA Take 5 mg by mouth every 4 (four) hours.   pantoprazole 40 MG tablet Commonly known as: PROTONIX Take 1 tablet (40 mg total) by mouth 2 (two) times daily before a meal.   triamterene-hydrochlorothiazide 37.5-25 MG capsule Commonly known as: DYAZIDE Take 1 capsule by mouth daily.   zolpidem 10 MG tablet Commonly known as: AMBIEN TAKE 1 TABLET BY MOUTH AT BEDTIME       Birth History: non-contributory  Developmental History: non-contributory  Past Surgical History: Past Surgical History:  Procedure Laterality Date  . ABDOMINAL HYSTERECTOMY    . ANTERIOR LAT LUMBAR FUSION Left 08/17/2018   Procedure: LUMBAR THREE-FOUR ANTERIOR LATERAL INTERBODY FUSION;  Surgeon: Earnie Larsson, MD;  Location: [place];  Service: Neurosurgery;  Laterality: Left;  . APPENDECTOMY  08/05/2017  . BLADDER SUSPENSION  03/01/2012   Procedure: TRANSVAGINAL TAPE (TVT) PROCEDURE;  Surgeon: Emily Filbert, MD;  Location: [facility];  Service: Gynecology;  Laterality: N/A;  . BUNIONECTOMY  1995   Right  . CHOLECYSTECTOMY  late 1980's      . CYSTOSCOPY  03/01/2012   Procedure: CYSTOSCOPY;  Surgeon: Emily Filbert, MD;  Location: [facility];  Service: Gynecology;  Laterality: N/A;  . CYSTOSCOPY W/ URETERAL STENT PLACEMENT Left 12/15/2014   Procedure: CYSTOSCOPY, LEFT URETEROSCOPYU WITH STONE EXTRACTION;  Surgeon: Irine Seal, MD;  Location: [facility];  Service: Urology;  Laterality: Left;  . CYSTOSCOPY W/ URETERAL STENT PLACEMENT Left 10/02/2016    Procedure: CYSTOSCOPY WITH RETROGRADE PYELOGRAM/URETERAL STENT PLACEMENT ureteroscopy, basket extraction;  Surgeon: Cleon Gustin, MD;  Location: [facility];  Service: Urology;  Laterality: Left;  . HARDWARE REMOVAL N/A 12/22/2016   Procedure: Lumbar Four-Five Removal of Hardware;  Surgeon: Earnie Larsson, MD;  Location: [facility];  Service: Neurosurgery;  Laterality: N/A;  . INCISION AND DRAINAGE ABSCESS Left 07/19/2013   Procedure: INCISION AND DRAINAGE LEFT LOWER EXTERMITY HEMATOMA;  Surgeon: Imogene Burn. Georgette Dover, MD;  Location: [facility];  Service: General;  Laterality: Left;  Excision left lower leg mass  . KNEE CARTILAGE SURGERY     left  . KNEE SURGERY Left    x 2, Murphy/Sue, corrected Patella displacement   . LAMINECTOMY    . LAPAROSCOPIC APPENDECTOMY N/A 08/05/2017   Procedure: APPENDECTOMY LAPAROSCOPIC;  Surgeon: Kinsinger, Arta Bruce, MD;  Location: [facility];  Service: General;  Laterality: N/A;  . LUMBAR LAMINECTOMY  10/06   L4-5, Ray  . LUMBAR PERCUTANEOUS PEDICLE SCREW 1 LEVEL Left 08/17/2018   Procedure: LUMBAR THREE-FOUR LUMBAR PERCUTANEOUS PEDICLE SCREW;  Surgeon: Earnie Larsson, MD;  Location: [facility];  Service: Neurosurgery;  Laterality: Left;  . R compressed pronator Right    Sypher, R arm  . RESECTION DISTAL CLAVICAL Right 06/30/2017   Procedure: RIGHT RESECTION DISTAL CLAVICAL;  Surgeon: Melrose Nakayama, MD;  Location: [facility];  Service: Orthopedics;  Laterality: Right;  . SHOULDER ACROMIOPLASTY Right 06/30/2017   Procedure: RIGHT SHOULDER ACROMIOPLASTY;  Surgeon: Melrose Nakayama, MD;  Location: [facility];  Service: Orthopedics;  Laterality: Right;  . SHOULDER ARTHROSCOPY Right 06/30/2017   Procedure: ARTHROSCOPY DEBRIDEMENT RIGHT SHOULDER;  Surgeon: Melrose Nakayama, MD;  Location: [facility];  Service: Orthopedics;  Laterality: Right;  . SPINAL FUSION    . TENDON REPAIR  2013  . TENDON REPAIR Right 02/01/12  . TONSILLECTOMY  age 38     Family History: Family History   Problem Relation Age of Onset  . Cancer Mother        pancreatic cancer  .  Heart disease Father 54  . Colon polyps Father   . Colon cancer Paternal Grandfather   . Colon cancer Maternal Grandmother   . Uterine cancer Other        Grandmother  . Breast cancer Other        Grandmother  . Ovarian cancer Other        Grandmother  . Diabetes Other        mother  . Other Neg Hx      Social History: Kaylene lives at home with her family. She lives in a house that is 55 years old.  There is laminate in the main living areas in the bedroom.  They have gas heating and central cooling.  There are 3 dogs inside of the home.  She does have dust mite covers on her bedding.  There is no tobacco exposure.  She does not use a HEPA filter.  There are no chemical exposures.   Review of Systems  Constitutional: Negative.  Negative for chills, fever, malaise/fatigue and weight loss.  HENT: Negative.  Negative for congestion, ear discharge, ear pain and sore throat.   Eyes: Negative for pain, discharge and redness.  Respiratory: Negative for cough, sputum production, shortness of breath and wheezing.   Cardiovascular: Negative.  Negative for chest pain and palpitations.  Gastrointestinal: Positive for abdominal pain and nausea. Negative for constipation, diarrhea, heartburn and vomiting.  Skin: Positive for itching and rash.  Neurological: Negative for dizziness and headaches.  Endo/Heme/Allergies: Negative for environmental allergies. Does not bruise/bleed easily.       Objective:   Blood pressure 122/66, pulse (!) 109, temperature 97.9 F (36.6 C), temperature source Temporal, resp. rate 18, height 5' 2.5" (1.588 m), weight 188 lb 6.4 oz (85.5 kg), last menstrual period 12/21/2011, SpO2 96 %. Body mass index is 33.91 kg/m.   Physical Exam:   Physical Exam  Constitutional: She appears well-developed.  Anxious female. Talkative.   HENT:  Head: Normocephalic and atraumatic.  Right Ear:  Tympanic membrane, external ear and ear canal normal. No drainage, swelling or tenderness. Tympanic membrane is not injected, not scarred, not erythematous, not retracted and not bulging.  Left Ear: Tympanic membrane, external ear and ear canal normal. No drainage, swelling or tenderness. Tympanic membrane is not injected, not scarred, not erythematous, not retracted and not bulging.  Nose: Mucosal edema and rhinorrhea present. No nasal deformity or septal deviation. No epistaxis. Right sinus exhibits no maxillary sinus tenderness and no frontal sinus tenderness. Left sinus exhibits no maxillary sinus tenderness and no frontal sinus tenderness.  Mouth/Throat: Uvula is midline and oropharynx is clear and moist. Mucous membranes are not pale and not dry.  Tonsils normal bilaterally. There are no exudates. Some cobblestoning appreciated in the posterior oropharynx.   Eyes: Pupils are equal, round, and reactive to light. Conjunctivae and EOM are normal. Right eye exhibits no chemosis and no discharge. Left eye exhibits no chemosis and no discharge. Right conjunctiva is not injected. Left conjunctiva is not injected.  Cardiovascular: Normal rate, regular rhythm and normal heart sounds.  Respiratory: Effort normal and breath sounds normal. No accessory muscle usage. No tachypnea. No respiratory distress. She has no wheezes. She has no rhonchi. She has no rales. She exhibits no tenderness.  GI: There is no abdominal tenderness. There is no rebound and no guarding.  Lymphadenopathy:       Head (right side): No submandibular, no tonsillar and no occipital adenopathy present.  Head (left side): No submandibular, no tonsillar and no occipital adenopathy present.    She has no cervical adenopathy.  Neurological: She is alert.  Skin: No abrasion, no petechiae and no rash noted. Rash is not papular, not vesicular and not urticarial. No erythema. No pallor.  Psychiatric: She has a normal mood and affect.      Diagnostic studies: labs sent instead       Salvatore Marvel, MD [facility]

## 2019-11-01 NOTE — Patient Instructions (Addendum)
1. Contact dermatitis - We need to some patch testing to look for chemical sensitivities, including plastics.  - These are placed on Monday and read on Wednesdays and Fridays. - Try to gather up some samples of the face masks so we can do some patch testing to the plastics/rubbers. - Also try to gather the mouth guard that you are concerned with as well.  - We can also do patch testing to look for other chemical sensitivities as well.  - Let's schedule this for a Monday in 4 weeks so we have time to gather what we need for the testing.    2. Chronic rhinitis  - We will get blood work to look for environmental allergy testing.  - Start taking: Zyrtec (cetirizine) 10mg  tablet once daily and Pataday (olopatadine) one drop per eye twice daily as needed - We will call you in 1-2 weeks with the results of the testing. - This will help [place] to guide future treatment.   3. Return in about 4 weeks (around 11/29/2019) for [facility] TESTING. This can be an in-person, a virtual Webex or a telephone follow up visit.   Please inform us of any Emergency Department visits, hospitalizations, or changes in symptoms. Call us before going to the ED for breathing or allergy symptoms since we might be able to fit you in for a sick visit. Feel free to contact us anytime with any questions, problems, or concerns.  It was a pleasure to meet you today!  Websites that have reliable patient information: 1. American Academy of Asthma, Allergy, and Immunology: www.aaaai.org 2. Food Allergy Research and Education (FARE): foodallergy.org 3. Mothers of Asthmatics: http://www.asthmacommunitynetwork.org 4. American College of Allergy, Asthma, and Immunology: www.acaai.org   COVID-19 Vaccine Information can be found at: ShippingScam.co.uk For questions related to vaccine distribution or appointments, please email vaccine@[email or web address] .com or call [phone].     "Like"  [place] on Facebook and Instagram for our latest updates!        Make sure you are registered to vote! If you have moved or changed any of your contact information, you will need to get this updated before voting!  In some cases, you MAY be able to register to vote online: [email or web address]

## 2019-11-02 ENCOUNTER — Telehealth: Payer: Self-pay | Admitting: Internal Medicine

## 2019-11-02 NOTE — Telephone Encounter (Signed)
Pt states her mouth broke out with blisters after her procedures. Please see note below she would like to speak with someone regarding the mouth piece that was used.

## 2019-11-03 LAB — IGE+ALLERGENS ZONE 2(30)

## 2019-11-04 ENCOUNTER — Encounter: Payer: Self-pay | Admitting: Allergy & Immunology

## 2019-11-21 ENCOUNTER — Telehealth: Payer: Self-pay | Admitting: Internal Medicine

## 2019-11-21 NOTE — Telephone Encounter (Signed)
Patient had endoscopy a few weeks ago and afterwards mouth had blisters and was swollen. She is allergic to several things and she is trying to find out if she is allergic to the mouth piece that was used during the procedure because she has several surgery's coming up. She is asking if she can come by and get one of the mouth pieces to be able to take to the allergist to get tested to see if she is allergic to it or not.

## 2019-11-21 NOTE — Telephone Encounter (Signed)
Returned pts call.  Informed her that I will leave a bite block and information sheet printed from [employer] website at the 4th floor front desk for her to pick up tomorrow 3/16.

## 2019-11-28 ENCOUNTER — Ambulatory Visit: Payer: BC Managed Care – PPO | Admitting: Allergy

## 2019-11-28 ENCOUNTER — Encounter: Payer: Self-pay | Admitting: Allergy

## 2019-11-28 ENCOUNTER — Other Ambulatory Visit: Payer: Self-pay

## 2019-11-28 VITALS — BP 124/90 | HR 99 | Temp 97.9°F

## 2019-11-28 DIAGNOSIS — L239 Allergic contact dermatitis, unspecified cause: Secondary | ICD-10-CM | POA: Diagnosis not present

## 2019-11-28 DIAGNOSIS — K1379 Other lesions of oral mucosa: Secondary | ICD-10-CM | POA: Insufficient documentation

## 2019-11-28 MED ORDER — MAGIC MOUTHWASH W/LIDOCAINE: 10.0000 mL | Freq: Three times a day (TID) | ORAL | 0 refills | Status: DC | PRN

## 2019-11-28 NOTE — Progress Notes (Signed)
   Follow Up Note  RE: BAILEI Allen MRN: [id number] DOB: [birth year] Date of Office Visit: 11/28/2019  Referring provider: Lucille Passy, MD Primary care provider: Lucille Passy, MD  History of Present Illness: I had the pleasure of seeing Melissa Allen for a follow up visit at the [facility] on 11/28/2019. She is a 54 y.o. female, who is being followed for dermatitis. Today she is here for patch test placement, given suspected history of contact dermatitis.   Patient is very concerned about the patch testing and whether she will have a reaction. She did bring in a pink tubing and the mouth guard used during her EGD.  She is complaining that her mouth has sores and feels pain with difficulty eating/drinking.  She has been using chapstick and biotene with minimal benefit. She wants a refill on the magic mouthwash.   Review of Systems  HENT: Positive for mouth sores.    Physical Exam  Constitutional: She appears well-developed and well-nourished.  HENT:  Head: Normocephalic and atraumatic.  Mouth/Throat: Oropharynx is clear and moist.  No visible ulcerations in the mouth noted today.  Nursing note and vitals reviewed.  Diagnostics: TRUE patches, metal patches and additional items placed - oxygen mask, pink tubing, strap from mouth guard and mouth guard.    Assessment and Plan: [name] is a 54 y.o. female with: Allergic contact dermatitis  TRUE patches, metal patches and additional items placed on the back - oxygen mask, pink tubing, strap from mouth guard and mouth guard.   The patient was instructed regarding proper care of the patches for the next 48 hours. Do not get patches wet - avoid showering until the next visit. Do not engage in vigorous physical activity.  Patient will follow up in 48 hours and 96 hours for patch readings.  Oral pain Complaining of oral pain. No visible ulcerations today.  May use magic mouthwash 57mL TID prn - swish and spit.     It was my pleasure to see [name] today and participate in her care. Please feel free to contact me with any questions or concerns.  Sincerely,  Rexene Alberts, DO Allergy & Immunology  [facility] office: [phone] [facility] office: [place] office: [phone]

## 2019-11-28 NOTE — Assessment & Plan Note (Addendum)
   TRUE patches, metal patches and additional items placed on the back - oxygen mask, pink tubing, strap from mouth guard and mouth guard.   The patient was instructed regarding proper care of the patches for the next 48 hours. Do not get patches wet - avoid showering until the next visit. Do not engage in vigorous physical activity.  Patient will follow up in 48 hours and 96 hours for patch readings.

## 2019-11-28 NOTE — Patient Instructions (Signed)
.   Patches placed today. . Please avoid strenuous physical activities and do not get the patches on the back wet. No showering until final patch reading done. . Okay to take antihistamines for itching but avoid placing any creams on the back where the patches are. . We will remove the patches on Wednesday and will do our initial read. . Then you will come back on Friday for a final read. 

## 2019-11-28 NOTE — Assessment & Plan Note (Signed)
Complaining of oral pain. No visible ulcerations today.  May use magic mouthwash 9mL TID prn - swish and spit.

## 2019-11-30 ENCOUNTER — Other Ambulatory Visit: Payer: Self-pay | Admitting: Student

## 2019-11-30 ENCOUNTER — Ambulatory Visit (INDEPENDENT_AMBULATORY_CARE_PROVIDER_SITE_OTHER): Payer: BC Managed Care – PPO | Admitting: Allergy

## 2019-11-30 ENCOUNTER — Other Ambulatory Visit: Payer: Self-pay

## 2019-11-30 ENCOUNTER — Encounter: Payer: Self-pay | Admitting: Allergy

## 2019-11-30 VITALS — Temp 97.8°F

## 2019-11-30 DIAGNOSIS — L239 Allergic contact dermatitis, unspecified cause: Secondary | ICD-10-CM

## 2019-11-30 DIAGNOSIS — M5412 Radiculopathy, cervical region: Secondary | ICD-10-CM

## 2019-11-30 NOTE — Assessment & Plan Note (Signed)
Today's testing was positive to the following. None of the extra materials reacted. 6. Fragrance Mix  1    26. Quinoline Mix  1    28. Gold Sodium Thiosulfate  --   +/-

## 2019-11-30 NOTE — Progress Notes (Signed)
Follow Up Note  RE: Melissa Allen MRN: [id number] DOB: [birth year] Date of Office Visit: 11/30/2019  Referring provider: Lucille Passy, MD Primary care provider: Lucille Passy, MD  History of Present Illness: I had the pleasure of seeing Melissa Allen for a follow up visit at the [facility] on 11/30/2019. She is a 54 y.o. female, who is being followed for possible dermatitis. Today she is here for initial patch test interpretation, given suspected history of contact dermatitis.   Diagnostics:  TRUE TEST, metal test and select few others 48 hour reading:  6. Fragrance Mix  1    26. Quinoline Mix  1    28. Gold Sodium Thiosulfate  --   +/-     T.R.U.E. Test - 11/30/19 1559    Time Antigen Placed  [place]  [employer]er    Lot #  [id number]    Location  Back    Number of Test  36    Reading Interval  Day 1    Panel  Panel 1;Panel 2;Panel 3    1. Nickel Sulfate  0    2. Wool Alcohols  0    3. Neomycin Sulfate  0    4. Potassium Dichromate  0    5. Caine Mix  0    6. Fragrance Mix  1    7. Colophony  0    8. Paraben Mix  0    9. Negative Control  0    10. Balsam of [place]  0    11. Ethylenediamine Dihydrochloride  0    12. Cobalt Dichloride  0    13. p-tert Butylphenol Formaldehyde Resin  0    14. Epoxy Resin  0    15. Carba Mix  0    16.  Black Rubber Mix  0    17. Cl+ Me-Isothiazolinone  0    18. Quaternium-15  0    19. Methyldibromo Glutaronitrile  0    20. p-Phenylenediamine  0    21. Formaldehyde  0    22. Mercapto Mix  0    23. Thimerosal  0    24. Thiuram Mix  0    25. Diazolidinyl Urea  0    26. Quinoline Mix  1    27. Tixocortol-21-Pivalate  0    28. Gold Sodium Thiosulfate  --   +/-   29. Imidazolidinyl Urea  0    30. Budesonide  0    31. Hydrocortisone-17-Butyrate  0    32. Mercaptobenzothiazole  0    33. Bacitracin  0    34. Parthenolide  0    35. Disperse Blue 106  0    36. 2-Bromo-2-Nitropropane-1,3-diol  0     Metals Patch  - 11/30/19 1600    Time Antigen Placed  1649    Manufacturer  Greer    Location  Back    Reading Interval  Day 1    Select  Select    Aluminum Hydroxide 10%  0    Chromium chloride 1%  0    Cobalt chloride hexahydrate 1%  0    Molybdenum chloride 0.5%  0    Nickel sulfate hexahydrate 5%  0    Potassium dichromate 0.25%  0    Copper sulfate pentahydrate 2%  0    Tantal 1%  0    Titanium 0.1%  0    Manganese chloride 0.5%  0  Vanadium Pentoxide 10%  0    Other  0    Other  0        Assessment and Plan: Melissa Allen is a 54 y.o. female with: Allergic contact dermatitis Today's testing was positive to the following. None of the extra materials reacted. 6. Fragrance Mix  1    26. Quinoline Mix  1    28. Gold Sodium Thiosulfate  --   +/-    The patient has been provided detailed information regarding the substances she is sensitive to, as well as products containing the substances.  Meticulous avoidance of these substances is recommended. If avoidance is not possible, the use of barrier creams or lotions is recommended.  If symptoms persist or progress despite meticulous avoidance of chemicals/substances above, dermatology evaluation may be warranted. Return for Patch reading.  It was my pleasure to see [name] today and participate in her care. Please feel free to contact me with any questions or concerns.  Sincerely,  Rexene Alberts, DO Allergy & Immunology  [facility] office: [phone] [facility] office: [place] office: [phone]

## 2019-12-02 ENCOUNTER — Other Ambulatory Visit: Payer: Self-pay

## 2019-12-02 ENCOUNTER — Ambulatory Visit (INDEPENDENT_AMBULATORY_CARE_PROVIDER_SITE_OTHER): Payer: BC Managed Care – PPO | Admitting: Allergy

## 2019-12-02 ENCOUNTER — Encounter: Payer: Self-pay | Admitting: Allergy

## 2019-12-02 DIAGNOSIS — L239 Allergic contact dermatitis, unspecified cause: Secondary | ICD-10-CM | POA: Diagnosis not present

## 2019-12-02 NOTE — Progress Notes (Signed)
    Follow-up Note  RE: Melissa Allen MRN: [id number] DOB: 02/13/1966 Date of Office Visit: 12/02/2019  Primary care provider: Lucille Passy, MD Referring provider: Lucille Passy, MD   Mikel returns to the office today for the initial patch test interpretation, given suspected history of contact dermatitis.    Diagnostics:  TRUE TEST 96 hour reading: TRUE TEST, metal test and select few others96 hour reading:  6. Fragrance Mix  1    26. Quinoline Mix  1       Plan:  Allergic contact dermatitis  The patient has been provided detailed information regarding the substances she is sensitive to, as well as products containing the substances.  Meticulous avoidance of these substances is recommended. If avoidance is not possible, the use of barrier creams or lotions is recommended.  Prudy Feeler, MD [facility]

## 2019-12-08 ENCOUNTER — Other Ambulatory Visit: Payer: Self-pay | Admitting: Student

## 2019-12-08 DIAGNOSIS — M5412 Radiculopathy, cervical region: Secondary | ICD-10-CM

## 2019-12-08 DIAGNOSIS — M5416 Radiculopathy, lumbar region: Secondary | ICD-10-CM

## 2019-12-13 ENCOUNTER — Other Ambulatory Visit: Payer: Self-pay

## 2019-12-13 ENCOUNTER — Encounter: Payer: Self-pay | Admitting: Internal Medicine

## 2019-12-13 ENCOUNTER — Ambulatory Visit: Payer: BC Managed Care – PPO | Admitting: Internal Medicine

## 2019-12-13 ENCOUNTER — Ambulatory Visit
Admission: RE | Admit: 2019-12-13 | Discharge: 2019-12-13 | Disposition: A | Discharge status: Home / Self Care | Payer: BC Managed Care – PPO | Source: Ambulatory Visit | Attending: Student | Admitting: Student

## 2019-12-13 VITALS — BP 100/76 | HR 76 | Temp 98.1°F | Ht 64.0 in | Wt 182.0 lb

## 2019-12-13 DIAGNOSIS — K862 Cyst of pancreas: Secondary | ICD-10-CM

## 2019-12-13 DIAGNOSIS — R109 Unspecified abdominal pain: Secondary | ICD-10-CM | POA: Diagnosis not present

## 2019-12-13 DIAGNOSIS — K2100 Gastro-esophageal reflux disease with esophagitis, without bleeding: Secondary | ICD-10-CM | POA: Diagnosis not present

## 2019-12-13 DIAGNOSIS — K219 Gastro-esophageal reflux disease without esophagitis: Secondary | ICD-10-CM

## 2019-12-13 DIAGNOSIS — M5416 Radiculopathy, lumbar region: Secondary | ICD-10-CM

## 2019-12-13 MED ORDER — GADOBENATE DIMEGLUMINE 529 MG/ML IV SOLN
18.0000 mL | Freq: Once | INTRAVENOUS | Status: AC | PRN
  Administered 2019-12-13: 18 mL via INTRAVENOUS

## 2019-12-13 NOTE — Progress Notes (Signed)
HISTORY OF PRESENT ILLNESS:  Melissa Allen is a pleasant 54 y.o. female with multiple medical problems including chronic pain syndrome for which she is on narcotics.  Previous patient of Dr. Olevia Perches.  Last seen in this office August 31, 2019 after being hospitalized with a mild bout of pancreatitis.  Chronic small pancreatic cyst felt to be benign.  Also various types of chronic abdominal pain, GERD, and obesity.  Last MRI of the pancreas November 2020 she underwent screening colonoscopy and upper endoscopy September 28, 2019.  Colonoscopy revealed diminutive colon polyps which were adenomatous.  No other abnormalities.  Upper endoscopy revealed mild esophagitis.  Otherwise normal.  She was continued on pantoprazole twice daily and asked to follow-up in the office in 3 months.  She tells me that after her endoscopy she had a swollen lip.  Etiology not ascertained.  Also describes a mouth ulcers.  Sounds like she has seen an allergist without cause ascertained.  They proposed possibly propofol.  In any event, she tells me that 1 week ago she had a traumatic fall.  She is now undergoing MRIs of the spine.  She stands during the encounter due to chronic back pain.  She also describes significant regurgitation with bending.  Heartburn and hiccups.  She has questions about her pancreas.  No real abdominal pain at this time.  She remains a pleasant but definite rambling historian  REVIEW OF SYSTEMS:  All non-GI ROS negative unless otherwise stated in the HPI except for back pain  Past Medical History:  Diagnosis Date  . Allergy   . Anginal pain ([place])    one episode 4-5 years ago-[facility]-nonspecific-no problems since-  . Anxiety   . Arthritis    multiple areas of arthritis- DDD  . Asthma    recent admit to ER 01/11/12 for exac of asthma  . Calcaneal fracture   . Duplicated ureter, right   . Fibromyalgia   . Hypertension   . Kidney stones   . Obesity (BMI 30.0-34.9)   . Pancreatitis  1986  . PONV (postoperative nausea and vomiting)   . Poor venous access    hard to start iv-  . 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 Shortness of breath dyspnea   . Wears glasses     Past Surgical History:  Procedure Laterality Date  . ABDOMINAL HYSTERECTOMY    . ANTERIOR LAT LUMBAR FUSION Left 08/17/2018   Procedure: LUMBAR THREE-FOUR ANTERIOR LATERAL INTERBODY FUSION;  Surgeon: Earnie Larsson, MD;  Location: [place];  Service: Neurosurgery;  Laterality: Left;  . APPENDECTOMY  08/05/2017  . BLADDER SUSPENSION  03/01/2012   Procedure: TRANSVAGINAL TAPE (TVT) PROCEDURE;  Surgeon: Emily Filbert, MD;  Location: [facility];  Service: Gynecology;  Laterality: N/A;  . BUNIONECTOMY  1995   Right  . CHOLECYSTECTOMY  late 1980's      . CYSTOSCOPY  03/01/2012   Procedure: CYSTOSCOPY;  Surgeon: Emily Filbert, MD;  Location: [facility];  Service: Gynecology;  Laterality: N/A;  . CYSTOSCOPY W/ URETERAL STENT PLACEMENT Left 12/15/2014   Procedure: CYSTOSCOPY, LEFT URETEROSCOPYU WITH STONE EXTRACTION;  Surgeon: Irine Seal, MD;  Location: [facility];  Service: Urology;  Laterality: Left;  . CYSTOSCOPY W/ URETERAL STENT PLACEMENT Left 10/02/2016   Procedure: CYSTOSCOPY WITH RETROGRADE PYELOGRAM/URETERAL STENT PLACEMENT ureteroscopy, basket extraction;  Surgeon: Cleon Gustin, MD;  Location: [facility];  Service: Urology;  Laterality: Left;  . HARDWARE REMOVAL N/A 12/22/2016   Procedure: Lumbar Four-Five Removal of Hardware;  Surgeon: Earnie Larsson, MD;  Location: [place];  Service: Neurosurgery;  Laterality: N/A;  . INCISION AND DRAINAGE ABSCESS Left 07/19/2013   Procedure: INCISION AND DRAINAGE LEFT LOWER EXTERMITY HEMATOMA;  Surgeon: Imogene Burn. Georgette Dover, MD;  Location: [place];  Service: General;  Laterality: Left;  Excision left lower leg mass  . KNEE CARTILAGE SURGERY     left  . KNEE SURGERY Left    x 2, Murphy/Sue, corrected Patella  displacement   . LAMINECTOMY    . LAPAROSCOPIC APPENDECTOMY N/A 08/05/2017   Procedure: APPENDECTOMY LAPAROSCOPIC;  Surgeon: Kinsinger, Arta Bruce, MD;  Location: [facility];  Service: General;  Laterality: N/A;  . LUMBAR LAMINECTOMY  10/06   L4-5, Ray  . LUMBAR PERCUTANEOUS PEDICLE SCREW 1 LEVEL Left 08/17/2018   Procedure: LUMBAR THREE-FOUR LUMBAR PERCUTANEOUS PEDICLE SCREW;  Surgeon: Earnie Larsson, MD;  Location: [place];  Service: Neurosurgery;  Laterality: Left;  . R compressed pronator Right    Sypher, R arm  . RESECTION DISTAL CLAVICAL Right 06/30/2017   Procedure: RIGHT RESECTION DISTAL CLAVICAL;  Surgeon: Melrose Nakayama, MD;  Location: [place];  Service: Orthopedics;  Laterality: Right;  . SHOULDER ACROMIOPLASTY Right 06/30/2017   Procedure: RIGHT SHOULDER ACROMIOPLASTY;  Surgeon: Melrose Nakayama, MD;  Location: [place];  Service: Orthopedics;  Laterality: Right;  . SHOULDER ARTHROSCOPY Right 06/30/2017   Procedure: ARTHROSCOPY DEBRIDEMENT RIGHT SHOULDER;  Surgeon: Melrose Nakayama, MD;  Location: [place];  Service: Orthopedics;  Laterality: Right;  . SPINAL FUSION    . TENDON REPAIR  2013  . TENDON REPAIR Right 02/01/12  . TONSILLECTOMY  age 56    [place]  reports that she has never smoked. She has never used smokeless tobacco. She reports previous alcohol use. She reports that she does not use drugs.  family history includes Breast cancer in an other family member; Cancer in her mother; Colon cancer in her maternal grandmother and paternal grandfather; Colon polyps in her father; Diabetes in an other family member; Heart disease (age of onset: 43) in her father; Ovarian cancer in an other family member; Uterine cancer in an other family member.  Allergies  Allergen Reactions  . Latex Itching, Swelling and Dermatitis    SWELLING FROM FACE MASK RING AFTER LAST SURGERY   . Lisinopril Cough       . Codeine Nausea And Vomiting  . Hydrocodone Itching  . Tape Rash     TOLERATES PAPER TAPE ONLY NO PINK SURGICAL TAPE EITHER, EKG Leads        PHYSICAL EXAMINATION: Vital signs: BP 100/76 (BP Location: Left Arm, Patient Position: Standing, Cuff Size: Normal)   Pulse 76   Temp 98.1 F (36.7 C)   Ht 5\' 4"  (1.626 m)   Wt 182 lb (82.6 kg)   LMP 12/21/2011   BMI 31.24 kg/m   Constitutional: generally well-appearing, no acute distress Psychiatric: alert and oriented x3, cooperative Eyes: extraocular movements intact, anicteric, conjunctiva pink Mouth: oral pharynx moist, no lesions Neck: supple no lymphadenopathy Cardiovascular: heart regular rate and rhythm, no murmur Lungs: clear to auscultation bilaterally Abdomen: soft, nontender, nondistended, no obvious ascites, no peritoneal signs, normal bowel sounds, no organomegaly Rectal: Omitted Extremities: no clubbing, cyanosis, or lower extremity edema bilaterally Skin: no lesions on visible extremities Neuro: No focal deficits.  Cranial nerves intact  ASSESSMENT:  1.  Chronic GERD.  Ongoing.  Recent endoscopy with mild esophagitis 2.  Diminutive adenomatous colon polyps 3.  Obesity 4.  Chronic pain syndrome 5.  History of mild pancreatitis and chronic pancreatic cyst 6.  Family history of pancreatic cancer   PLAN:  1.  Reflux precautions 2.  Continue twice daily PPI 3.  Surveillance colonoscopy in 5 years 4.  MRI of the pancreas next month.  6 months from previous.  Compare 5.  Routine GI follow-up 1 year.  Sooner if needed A total time of 30 minutes was spent preparing to see the patient, reviewing test, obtaining history, performing medically appropriate examination, counseling the patient regarding the above listed issues, answering questions, ordering medications and advanced imaging, documenting information in the clinical health record.

## 2019-12-13 NOTE — Patient Instructions (Signed)
You have been scheduled for an MRI at [facility] on 01/11/2020. Your appointment time is 12:30pm. Please arrive 20 minutes prior to your appointment time for registration purposes. Please make certain not to have anything to eat or drink 4 hours prior to your test. In addition, if you have any metal in your body, have a pacemaker or defibrillator, please be sure to let your ordering physician know. This test typically takes 45 minutes to 1 hour to complete. Should you need to reschedule, please call [phone] to do so.

## 2019-12-15 ENCOUNTER — Other Ambulatory Visit: Payer: BC Managed Care – PPO

## 2019-12-16 ENCOUNTER — Other Ambulatory Visit: Payer: Self-pay

## 2019-12-16 MED ORDER — ALPRAZOLAM 1 MG PO TABS: 1.0000 mg | ORAL_TABLET | Freq: Two times a day (BID) | ORAL | 0 refills | Status: AC | PRN

## 2019-12-16 NOTE — Progress Notes (Signed)
Rx refilled x 30 days and pt can discuss further refills or continuing med at scheduled appt with new PCP

## 2019-12-16 NOTE — Progress Notes (Signed)
[facility]-Pt has appt scheduled with [facility] on 4.20.21/last filled 3.5.2021/plz advise/thx dmf

## 2019-12-19 ENCOUNTER — Ambulatory Visit
Admission: RE | Admit: 2019-12-19 | Discharge: 2019-12-19 | Disposition: A | Discharge status: Home / Self Care | Payer: BC Managed Care – PPO | Source: Ambulatory Visit | Attending: Student | Admitting: Student

## 2019-12-19 ENCOUNTER — Other Ambulatory Visit: Payer: Self-pay

## 2019-12-19 DIAGNOSIS — M5412 Radiculopathy, cervical region: Secondary | ICD-10-CM

## 2019-12-21 ENCOUNTER — Telehealth: Payer: Self-pay | Admitting: Allergy

## 2019-12-21 NOTE — Telephone Encounter (Signed)
Antihistamines are okay to take during patch testing and it doesn't affect test results.

## 2019-12-21 NOTE — Telephone Encounter (Signed)
Patient had a cervicale pain and pain doctor place her on Doxepin to alleviate her issues and wanted to make sure it will not affect her. Patient was concern because it had antihistamines in it mention to her by her Pain management and wanted to make sure it didn't alter her Patch testing. Patient has been taking this since December of 2020 and wanted to make sure it didn't alter the test.

## 2019-12-21 NOTE — Telephone Encounter (Signed)
I believe what the patient means is could this medication have affected her results of the patch testing since she was on this medication at the time of the testing.

## 2019-12-21 NOTE — Telephone Encounter (Signed)
Patient called and said that she just left the pain doctor and was told that she is on Ajant 10 mg. And to let us know encase it may react to some of her meds. [phone].

## 2019-12-21 NOTE — Telephone Encounter (Signed)
Please call patient.  I'm a little confused about this message as her patch testing was done. Antihistamines are okay to take during patch testing and it doesn't affect test results.

## 2019-12-21 NOTE — Telephone Encounter (Signed)
Called and spoke with patient and she verbalized understanding. Doxepin has been added to her medlist through Epic per the patient's request.

## 2019-12-26 ENCOUNTER — Other Ambulatory Visit: Payer: Self-pay

## 2019-12-27 ENCOUNTER — Ambulatory Visit: Payer: BC Managed Care – PPO | Admitting: Family Medicine

## 2019-12-27 ENCOUNTER — Encounter: Payer: Self-pay | Admitting: Family Medicine

## 2019-12-27 ENCOUNTER — Telehealth: Payer: Self-pay | Admitting: Family Medicine

## 2019-12-27 VITALS — BP 144/82 | HR 91 | Temp 97.3°F | Ht 64.0 in | Wt 183.4 lb

## 2019-12-27 DIAGNOSIS — E2839 Other primary ovarian failure: Secondary | ICD-10-CM

## 2019-12-27 DIAGNOSIS — W19XXXS Unspecified fall, sequela: Secondary | ICD-10-CM

## 2019-12-27 DIAGNOSIS — G8928 Other chronic postprocedural pain: Secondary | ICD-10-CM | POA: Diagnosis not present

## 2019-12-27 DIAGNOSIS — R232 Flushing: Secondary | ICD-10-CM

## 2019-12-27 DIAGNOSIS — K861 Other chronic pancreatitis: Secondary | ICD-10-CM | POA: Diagnosis not present

## 2019-12-27 DIAGNOSIS — E8940 Asymptomatic postprocedural ovarian failure: Secondary | ICD-10-CM | POA: Insufficient documentation

## 2019-12-27 DIAGNOSIS — W19XXXA Unspecified fall, initial encounter: Secondary | ICD-10-CM | POA: Insufficient documentation

## 2019-12-27 DIAGNOSIS — Z0000 Encounter for general adult medical examination without abnormal findings: Secondary | ICD-10-CM | POA: Diagnosis not present

## 2019-12-27 DIAGNOSIS — G4700 Insomnia, unspecified: Secondary | ICD-10-CM

## 2019-12-27 DIAGNOSIS — F339 Major depressive disorder, recurrent, unspecified: Secondary | ICD-10-CM

## 2019-12-27 LAB — URINALYSIS, ROUTINE W REFLEX MICROSCOPIC
Bilirubin Urine: NEGATIVE
Hgb urine dipstick: NEGATIVE
Ketones, ur: NEGATIVE
Nitrite: NEGATIVE
RBC / HPF: NONE SEEN (ref 0–?)
Specific Gravity, Urine: 1.02 (ref 1.000–1.030)
Total Protein, Urine: NEGATIVE
Urine Glucose: NEGATIVE
Urobilinogen, UA: 0.2 (ref 0.0–1.0)
pH: 8.5 — AB (ref 5.0–8.0)

## 2019-12-27 LAB — CBC
HCT: 40.9 % (ref 36.0–46.0)
Hemoglobin: 13.8 g/dL (ref 12.0–15.0)
MCHC: 33.8 g/dL (ref 30.0–36.0)
MCV: 85.4 fl (ref 78.0–100.0)
Platelets: 290 10*3/uL (ref 150.0–400.0)
RBC: 4.78 Mil/uL (ref 3.87–5.11)
RDW: 13.9 % (ref 11.5–15.5)
WBC: 5.5 10*3/uL (ref 4.0–10.5)

## 2019-12-27 LAB — COMPREHENSIVE METABOLIC PANEL
ALT: 17 U/L (ref 0–35)
AST: 25 U/L (ref 0–37)
Albumin: 4.4 g/dL (ref 3.5–5.2)
Alkaline Phosphatase: 92 U/L (ref 39–117)
BUN: 7 mg/dL (ref 6–23)
CO2: 29 mEq/L (ref 19–32)
Calcium: 10 mg/dL (ref 8.4–10.5)
Chloride: 102 mEq/L (ref 96–112)
Creatinine, Ser: 0.63 mg/dL (ref 0.40–1.20)
GFR: 98.11 mL/min (ref >60.00)
Glucose, Bld: 101 mg/dL — ABNORMAL HIGH (ref 70–99)
Potassium: 3.4 mEq/L — ABNORMAL LOW (ref 3.5–5.1)
Sodium: 140 mEq/L (ref 135–145)
Total Bilirubin: 0.5 mg/dL (ref 0.2–1.2)
Total Protein: 6.9 g/dL (ref 6.0–8.3)

## 2019-12-27 LAB — LIPID PANEL
Cholesterol: 307 mg/dL — ABNORMAL HIGH (ref 0–200)
HDL: 58.3 mg/dL (ref >39.00)
NonHDL: 248.99
Total CHOL/HDL Ratio: 5
Triglycerides: 245 mg/dL — ABNORMAL HIGH (ref 0.0–149.0)
VLDL: 49 mg/dL — ABNORMAL HIGH (ref 0.0–40.0)

## 2019-12-27 LAB — AMYLASE: Amylase: 40 U/L (ref 27–131)

## 2019-12-27 LAB — LIPASE: Lipase: 24 U/L (ref 11.0–59.0)

## 2019-12-27 LAB — TSH: TSH: 2.09 u[IU]/mL (ref 0.35–4.50)

## 2019-12-27 LAB — HEMOGLOBIN A1C: Hgb A1c MFr Bld: 5.8 % (ref 4.6–6.5)

## 2019-12-27 LAB — LDL CHOLESTEROL, DIRECT: Direct LDL: 183 mg/dL

## 2019-12-27 MED ORDER — ESTROGENS CONJUGATED 0.3 MG PO TABS: ORAL_TABLET | ORAL | 3 refills | Status: DC

## 2019-12-27 MED ORDER — ESTROGENS CONJUGATED 0.3 MG PO TABS: ORAL_TABLET | ORAL | 0 refills | Status: DC

## 2019-12-27 NOTE — Telephone Encounter (Signed)
Patient called back and stated that she forgot to mention that she needed a refill for Ambien sent to [facility]. CB is [phone]

## 2019-12-27 NOTE — Progress Notes (Signed)
Established Patient Office Visit  Subjective:  Patient ID: Melissa Allen, female    DOB: 05/17/1966  Age: 54 y.o. MRN: [id number]  CC:  Chief Complaint  Patient presents with  . Transitions Of Care    former pt of Dr. Deborra Medina, refill/follow up on medications.     HPI Melissa Allen presents for multiple issues and concerns.  She is requesting refills of her Xanax and Premarin.  History of surgical menopause.  She has been on Premarin for years with good effect.  She has hot flashes when she does not take it.  She has not had a mammogram.  Unfortunately she deals with chronic pain involving her neck lower back.  She has a history of chronic idiopathic pancreatitis.  Chart review does show mildly elevated triglycerides.  Assures me that she does not drink.  She has had multiple falls.  She sees Dr. Trenton Gammon for her lower back and neck issues.  She sees Dr. Hardin Negus in chronic pain who is currently treating her chronic pain and depression issues.  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 Allergy   . Anginal pain ([place])    one episode 4-5 years ago- [facility] [place]-nonspecific-no problems since-  . Anxiety   . Arthritis    multiple areas of arthritis- DDD  . Asthma    recent admit to ER 01/11/12 for exac of asthma  . Calcaneal fracture   . Duplicated ureter, right   . Fibromyalgia   . Hypertension   . Kidney stones   . Obesity (BMI 30.0-34.9)   . Pancreatitis 1986  . PONV (postoperative nausea and vomiting)   . Poor venous access    hard to start iv-  . 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 Shortness of breath dyspnea   . Wears glasses     Past Surgical History:  Procedure Laterality Date  . ABDOMINAL HYSTERECTOMY    . ANTERIOR LAT LUMBAR FUSION Left 08/17/2018   Procedure: LUMBAR THREE-FOUR ANTERIOR LATERAL INTERBODY FUSION;  Surgeon:  Earnie Larsson, MD;  Location: [facility];  Service: Neurosurgery;  Laterality: Left;  . APPENDECTOMY  08/05/2017  . BLADDER SUSPENSION  03/01/2012   Procedure: TRANSVAGINAL TAPE (TVT) PROCEDURE;  Surgeon: Emily Filbert, MD;  Location: [facility];  Service: Gynecology;  Laterality: N/A;  . BUNIONECTOMY  1995   Right  . CHOLECYSTECTOMY  late 1980's      . CYSTOSCOPY  03/01/2012   Procedure: CYSTOSCOPY;  Surgeon: Emily Filbert, MD;  Location: [facility];  Service: Gynecology;  Laterality: N/A;  . CYSTOSCOPY W/ URETERAL STENT PLACEMENT Left 12/15/2014   Procedure: CYSTOSCOPY, LEFT URETEROSCOPYU WITH STONE EXTRACTION;  Surgeon: Irine Seal, MD;  Location: [facility];  Service: Urology;  Laterality: Left;  . CYSTOSCOPY W/ URETERAL STENT PLACEMENT Left 10/02/2016   Procedure: CYSTOSCOPY WITH RETROGRADE PYELOGRAM/URETERAL STENT PLACEMENT ureteroscopy, basket extraction;  Surgeon: Cleon Gustin, MD;  Location: [facility];  Service: Urology;  Laterality: Left;  . HARDWARE REMOVAL N/A 12/22/2016   Procedure: Lumbar Four-Five Removal of Hardware;  Surgeon: Earnie Larsson, MD;  Location: [facility];  Service: Neurosurgery;  Laterality: N/A;  . INCISION AND DRAINAGE ABSCESS Left 07/19/2013   Procedure: INCISION AND DRAINAGE LEFT LOWER EXTERMITY HEMATOMA;  Surgeon: Imogene Burn. Georgette Dover, MD;  Location: [facility];  Service: General;  Laterality:  Left;  Excision left lower leg mass  . KNEE CARTILAGE SURGERY     left  . KNEE SURGERY Left    x 2, Murphy/Sue, corrected Patella displacement   . LAMINECTOMY    . LAPAROSCOPIC APPENDECTOMY N/A 08/05/2017   Procedure: APPENDECTOMY LAPAROSCOPIC;  Surgeon: Kinsinger, Arta Bruce, MD;  Location: [facility];  Service: General;  Laterality: N/A;  . LUMBAR LAMINECTOMY  10/06   L4-5, Ray  . LUMBAR PERCUTANEOUS PEDICLE SCREW 1 LEVEL Left 08/17/2018   Procedure: LUMBAR THREE-FOUR LUMBAR PERCUTANEOUS PEDICLE SCREW;  Surgeon: Earnie Larsson, MD;  Location: [facility];  Service: Neurosurgery;  Laterality: Left;    . R compressed pronator Right    Sypher, R arm  . RESECTION DISTAL CLAVICAL Right 06/30/2017   Procedure: RIGHT RESECTION DISTAL CLAVICAL;  Surgeon: Melrose Nakayama, MD;  Location: [facility];  Service: Orthopedics;  Laterality: Right;  . SHOULDER ACROMIOPLASTY Right 06/30/2017   Procedure: RIGHT SHOULDER ACROMIOPLASTY;  Surgeon: Melrose Nakayama, MD;  Location: [facility];  Service: Orthopedics;  Laterality: Right;  . SHOULDER ARTHROSCOPY Right 06/30/2017   Procedure: ARTHROSCOPY DEBRIDEMENT RIGHT SHOULDER;  Surgeon: Melrose Nakayama, MD;  Location: [place] [facility];  Service: Orthopedics;  Laterality: Right;  . SPINAL FUSION    . TENDON REPAIR  2013  . TENDON REPAIR Right 02/01/12  . TONSILLECTOMY  age 67    Family History  Problem Relation Age of Onset  . Cancer Mother        pancreatic cancer  . Heart disease Father 54  . Colon polyps Father   . Colon cancer Paternal Grandfather   . Colon cancer Maternal Grandmother   . Uterine cancer Other        Grandmother  . Breast cancer Other        Grandmother  . Ovarian cancer Other        Grandmother  . Diabetes Other        mother  . Other Neg Hx     Social History   Socioeconomic History  . Marital status: Married    Spouse name: Not on file  . Number of children: 3  . Years of education: Not on file  . Highest education level: Not on file  Occupational History  . Occupation: Product manager: [employer]    Comment: [employer] (6th grade)  Tobacco Use  . Smoking status: Never Smoker  . Smokeless tobacco: Never Used  Substance and Sexual Activity  . Alcohol use: Not Currently  . Drug use: No  . Sexual activity: Yes    Birth control/protection: Surgical  Other Topics Concern  . 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 Difficulty of Paying Living Expenses:   Food Insecurity:   . Worried About Charity fundraiser in the Last Year:   . Arboriculturist in the Last Year:   Transportation Needs:   . Film/video editor (Medical):   Marland Kitchen Lack of Transportation (Non-Medical):   Physical Activity:   . Days of Exercise per Week:   . Minutes of Exercise per Session:   Stress:   . Feeling of Stress :   Social Connections:   . Frequency of Communication with  Friends and Family:   . Frequency of Social Gatherings with Friends and Family:   . Attends Religious Services:   . Active Member of Clubs or Organizations:   . Attends Archivist Meetings:   Marland Kitchen Marital Status:   Intimate Partner Violence:   . Fear of Current or Ex-Partner:   . Emotionally Abused:   Marland Kitchen Physically Abused:   . Sexually Abused:     Outpatient Medications Prior to Visit  Medication Sig Dispense Refill  . albuterol (PROAIR HFA) 108 (90 Base) MCG/ACT inhaler INHALE TWO PUFFS EVERY FOUR HOURS AS NEEDED SHORTNESS OF BREATH 8.5 g 2  . ALPRAZolam (XANAX) 1 MG tablet Take 1 tablet (1 mg total) by mouth 2 (two) times daily as needed for anxiety. 60 tablet 0  . cyclobenzaprine (FLEXERIL) 5 MG tablet Take 5 mg by mouth 3 (three) times daily as needed for muscle spasms.    Marland Kitchen doxepin (SINEQUAN) 10 MG capsule Take 10 mg by mouth at bedtime.    . DULoxetine (CYMBALTA) 60 MG capsule Take 90 mg by mouth daily.     Marland Kitchen EPINEPHrine 0.3 mg/0.3 mL IJ SOAJ injection Inject 0.3 mLs (0.3 mg total) into the muscle as needed for anaphylaxis. 1 each 1  . fluticasone (FLONASE) 50 MCG/ACT nasal spray USE 2 SPRAYS INTO EACH NOSTRIL ONCE DAILY AS DIRECTED (Patient taking differently: Place 2 sprays into both nostrils daily. ) 16 g 11  . ibuprofen (ADVIL,MOTRIN) 200 MG tablet You can safely take 3 tablets every 6 hours as needed for pain.  He can alternate this with plain Tylenol.  You can also alternate with the prescribed  narcotic.  I would recommend to use the Tylenol and ibuprofen as your first line pain medications. (Patient taking differently: Take 600 mg by mouth every 6 (six) hours as needed (pain). You can safely take 3 tablets every 6 hours as needed for pain.  He can alternate this with plain Tylenol.  You can also alternate with the prescribed narcotic.  I would recommend to use the Tylenol and ibuprofen as your first line pain medications.)    . losartan (COZAAR) 50 MG tablet TAKE 1 TABLET BY MOUTH ONCE DAILY (Patient taking differently: Take 50 mg by mouth daily. ) 90 tablet 1  . Olopatadine HCl (PATADAY) 0.2 % SOLN Place 1 drop into both eyes 2 (two) times daily as needed. 7.5 mL 1  . oxymorphone (OPANA) 5 MG tablet Take 5 mg by mouth every 4 (four) hours.    . pantoprazole (PROTONIX) 40 MG tablet Take 1 tablet (40 mg total) by mouth 2 (two) times daily before a meal. 60 tablet 3  . triamterene-hydrochlorothiazide (DYAZIDE) 37.5-25 MG capsule Take 1 capsule by mouth daily.    Marland Kitchen zolpidem (AMBIEN) 10 MG tablet TAKE 1 TABLET BY MOUTH AT BEDTIME (Patient taking differently: Take 10 mg by mouth at bedtime. ) 30 tablet 5  . estrogens, conjugated, (PREMARIN) 0.3 MG tablet TAKE 1 TABLET BY MOUTH DAILY (TAKE FOR 21 DAYS THEN DO NOT TAKE FOR 7DAYS) (Patient taking differently: Take 0.3 mg by mouth as directed. Take 1 tablet (0.3mg ) every day for 21 days, then do not take medication for 7 days) 90 tablet 3  . cetirizine (ZYRTEC) 10 MG tablet Take 1 tablet (10 mg total) by mouth daily. (Patient not taking: Reported on 12/27/2019) 90 tablet 1  . magic mouthwash w/lidocaine SOLN Take 10 mLs by mouth 3 (three) times daily as needed for mouth pain (swish and  spit 3 times a day as needed for tongue pain). (Patient not taking: Reported on 12/27/2019) 150 mL 0  . dicyclomine (BENTYL) 10 MG capsule Take 1 capsule (10 mg total) by mouth 3 (three) times daily before meals. 90 capsule 0   No facility-administered medications prior to  visit.    Allergies  Allergen Reactions  . Latex Itching, Swelling and Dermatitis    SWELLING FROM FACE MASK RING AFTER LAST SURGERY   . Lisinopril Cough       . Codeine Nausea And Vomiting  . Hydrocodone Itching  . Tape Rash    TOLERATES PAPER TAPE ONLY NO PINK SURGICAL TAPE EITHER, EKG Leads     ROS Review of Systems  Constitutional: Negative for diaphoresis, fatigue, fever and unexpected weight change.  HENT: Negative.   Eyes: Negative for photophobia and visual disturbance.  Respiratory: Negative.   Cardiovascular: Negative.   Gastrointestinal: Negative.   Genitourinary: Negative.   Musculoskeletal: Positive for arthralgias, back pain, gait problem, neck pain and neck stiffness.  Skin: Negative for pallor and rash.  Neurological: Positive for weakness and numbness.  Psychiatric/Behavioral: Positive for dysphoric mood and sleep disturbance. The patient is nervous/anxious.       Objective:    Physical Exam  Constitutional: She is oriented to person, place, and time. She appears well-developed and well-nourished. No distress.  HENT:  Head: Normocephalic and atraumatic.  Right Ear: External ear normal.  Left Ear: External ear normal.  Eyes: Conjunctivae are normal. Right eye exhibits no discharge. Left eye exhibits no discharge. No scleral icterus.  Neck: No tracheal deviation present.  Cardiovascular: Normal rate, regular rhythm and normal heart sounds.  Pulmonary/Chest: Effort normal and breath sounds normal. No stridor.  Abdominal: Bowel sounds are normal.  Musculoskeletal:        General: No edema.  Neurological: She is alert and oriented to person, place, and time.  Skin: Skin is warm and dry. She is not diaphoretic.  Psychiatric: She has a normal mood and affect. Her behavior is normal.    BP (!) 144/82   Pulse 91   Temp (!) 97.3 F (36.3 C) (Tympanic)   Ht 5\' 4"  (1.626 m)   Wt 183 lb 6.4 oz (83.2 kg)   LMP 12/21/2011   SpO2 98%   BMI 31.48 kg/m   Wt Readings from Last 3 Encounters:  12/27/19 183 lb 6.4 oz (83.2 kg)  12/13/19 182 lb (82.6 kg)  11/01/19 188 lb 6.4 oz (85.5 kg)     Health Maintenance Due  Topic Date Due  . COVID-19 Vaccine (1) Never done  . PAP SMEAR-Modifier  07/27/2014    There are no preventive care reminders to display for this patient.  Lab Results  Component Value Date   TSH 1.21 12/31/2015   Lab Results  Component Value Date   WBC 5.8 08/23/2019   HGB 14.1 08/23/2019   HCT 42.5 08/23/2019   MCV 86.4 08/23/2019   PLT 305.0 08/23/2019   Lab Results  Component Value Date   NA 139 08/17/2019   K 2.6 (LL) 08/17/2019   CO2 31 08/17/2019   GLUCOSE 98 08/17/2019   BUN 10 08/17/2019   CREATININE 0.63 08/17/2019   BILITOT 0.5 08/17/2019   ALKPHOS 82 08/17/2019   AST 23 08/17/2019   ALT 18 08/17/2019   PROT 7.0 08/17/2019   ALBUMIN 3.8 08/17/2019   CALCIUM 9.7 08/17/2019   ANIONGAP 11 08/17/2019   GFR 103.96 07/29/2019   Lab Results  Component  Value Date   CHOL 244 (H) 08/04/2011   Lab Results  Component Value Date   HDL 71 08/04/2011   Lab Results  Component Value Date   LDLCALC 141 (H) 08/04/2011   Lab Results  Component Value Date   TRIG 180 (H) 07/19/2019   Lab Results  Component Value Date   CHOLHDL 3.4 08/04/2011   Lab Results  Component Value Date   HGBA1C 5.7 (H) 07/20/2019      Assessment & Plan:   Problem List Items Addressed This Visit      Cardiovascular and Mediastinum   Hot flashes   Relevant Medications   estrogens, conjugated, (PREMARIN) 0.3 MG tablet     Digestive   PANCREATITIS, CHRONIC - Primary   Relevant Orders   Amylase   Lipase     Other   Depression, recurrent (HCC)   Insomnia   Healthcare maintenance   Relevant Orders   MM Digital Screening   CBC   Comprehensive metabolic panel   Lipid panel   Hemoglobin A1c   TSH   Urinalysis, Routine w reflex microscopic   Fall   Surgical menopause   Relevant Medications   estrogens,  conjugated, (PREMARIN) 0.3 MG tablet   Other chronic postprocedural pain   Estrogen deficiency   Relevant Medications   estrogens, conjugated, (PREMARIN) 0.3 MG tablet      Meds ordered this encounter  Medications  . DISCONTD: estrogens, conjugated, (PREMARIN) 0.3 MG tablet    Sig: TAKE 1 TABLET BY MOUTH DAILY (TAKE FOR 21 DAYS THEN DO NOT TAKE FOR 7DAYS)    Dispense:  90 tablet    Refill:  3  . estrogens, conjugated, (PREMARIN) 0.3 MG tablet    Sig: TAKE 1 TABLET BY MOUTH DAILY (TAKE FOR 21 DAYS THEN DO NOT TAKE FOR 7DAYS)    Dispense:  90 tablet    Refill:  0    Follow-up: Return in about 6 months (around 06/27/2020).   Expressed my concern about her falls.  I told her that I could not fill her Xanax for her and she said that her pain management doctor is doing that for her.  Suggested that she may want to use a cane.  She said that she would consider it.  Libby Maw, MD

## 2019-12-28 NOTE — Telephone Encounter (Signed)
Patient calling asking to see if you would refill her Ambien, she states that she is aware that you can not refill Zoloft but wanted to ask if you would fill Ambien. Please advise.

## 2019-12-28 NOTE — Telephone Encounter (Signed)
Patient is calling back and check the status of prescription and also patient is requesting a call back regarding medication. CB [phone]

## 2019-12-29 NOTE — Telephone Encounter (Signed)
No, I cannot.

## 2019-12-30 NOTE — Telephone Encounter (Signed)
Patient aware.

## 2020-01-06 ENCOUNTER — Other Ambulatory Visit: Payer: BC Managed Care – PPO

## 2020-01-11 ENCOUNTER — Other Ambulatory Visit: Payer: Self-pay

## 2020-01-11 ENCOUNTER — Ambulatory Visit
Admission: RE | Admit: 2020-01-11 | Discharge: 2020-01-11 | Disposition: A | Discharge status: Home / Self Care | Payer: BC Managed Care – PPO | Source: Ambulatory Visit | Attending: Internal Medicine | Admitting: Internal Medicine

## 2020-01-11 DIAGNOSIS — K219 Gastro-esophageal reflux disease without esophagitis: Secondary | ICD-10-CM

## 2020-01-11 DIAGNOSIS — K862 Cyst of pancreas: Secondary | ICD-10-CM

## 2020-01-11 DIAGNOSIS — R109 Unspecified abdominal pain: Secondary | ICD-10-CM

## 2020-01-11 DIAGNOSIS — K2100 Gastro-esophageal reflux disease with esophagitis, without bleeding: Secondary | ICD-10-CM

## 2020-01-11 MED ORDER — GADOBENATE DIMEGLUMINE 529 MG/ML IV SOLN
17.0000 mL | Freq: Once | INTRAVENOUS | Status: AC | PRN
  Administered 2020-01-11: 17 mL via INTRAVENOUS

## 2020-01-17 ENCOUNTER — Other Ambulatory Visit: Payer: Self-pay | Admitting: Neurosurgery

## 2020-01-17 DIAGNOSIS — S46911A Strain of unspecified muscle, fascia and tendon at shoulder and upper arm level, right arm, initial encounter: Secondary | ICD-10-CM

## 2020-02-03 ENCOUNTER — Telehealth: Payer: Self-pay | Admitting: Family Medicine

## 2020-02-03 MED ORDER — LOSARTAN POTASSIUM 50 MG PO TABS: 50.0000 mg | ORAL_TABLET | Freq: Every day | ORAL | 0 refills | Status: DC

## 2020-02-03 NOTE — Telephone Encounter (Signed)
Patient is calling and requesting a refill for losartan sent to [facility] in [place]. [place] [phone]. Fax number is [phone]. CB is [phone]

## 2020-02-15 ENCOUNTER — Other Ambulatory Visit: Payer: Self-pay

## 2020-02-15 ENCOUNTER — Ambulatory Visit
Admission: RE | Admit: 2020-02-15 | Discharge: 2020-02-15 | Disposition: A | Discharge status: Home / Self Care | Payer: BC Managed Care – PPO | Source: Ambulatory Visit | Attending: Neurosurgery | Admitting: Neurosurgery

## 2020-02-15 DIAGNOSIS — S46911A Strain of unspecified muscle, fascia and tendon at shoulder and upper arm level, right arm, initial encounter: Secondary | ICD-10-CM

## 2020-02-17 ENCOUNTER — Other Ambulatory Visit: Payer: Self-pay | Admitting: Neurosurgery

## 2020-02-23 ENCOUNTER — Encounter (HOSPITAL_COMMUNITY): Payer: Self-pay

## 2020-02-23 NOTE — Pre-Procedure Instructions (Signed)
Melissa Allen  02/23/2020      Your procedure is scheduled on Tuesday, June 22..  Report to [facility], Main Entrance or Entrance "A" at  6:00 AM                       Your surgery or procedure is scheduled for  8:00 A.M.   Call this number if you have problems the morning of surgery: [phone]  This is the number for the Pre- Surgical Desk.             For any other questions, please call [phone], Monday - Friday 8 AM - 4 PM.   Remember:  Do not eat or drink after midnight the evening before your surgery.     Take these medicines the morning of surgery with A SIP OF WATER:  DULoxetine (CYMBALTA)             estrogens, conjugated, (PREMARIN) - if due             fluticasone (FLONASE) nasal spray             oxymorphone (OPANA)              pantoprazole (PROTONIX)  If needed: cyclobenzaprine (FLEXERIL) albuterol (PROAIR HFA) bring the inhaler to the hospital with you. ne), Ibuprofen (Advil), Naproxen (Aleve), Vitamins and Herbal Products (ie Fish Oil).  Special instructions:   [facility]- Preparing For Surgery  Before surgery, you can play an important role. Because skin is not sterile, your skin needs to be as free of germs as possible. You can reduce the number of germs on your skin by washing with CHG (chlorahexidine gluconate) Soap before surgery.  CHG is an antiseptic cleaner which kills germs and bonds with the skin to continue killing germs even after washing.    Oral Hygiene is also important to reduce your risk of infection.  Remember - BRUSH YOUR TEETH THE MORNING OF SURGERY WITH YOUR REGULAR TOOTHPASTE  Please do not use if you have an allergy to CHG or antibacterial soaps. If your skin becomes reddened/irritated stop using the CHG.  Do not shave (including legs and underarms) for at least 48 hours prior to first CHG shower. It is OK to shave your face.  Please follow these instructions carefully.   1. Shower the NIGHT BEFORE SURGERY and the  MORNING OF SURGERY with CHG.   2. If you chose to wash your hair, wash your hair first as usual with your normal shampoo.  3. After you shampoo, rinse your hair and body thoroughly to remove the shampoo.  4. Use CHG as you would any other liquid soap. You can apply CHG directly to the skin and wash gently with a scrungie or a clean washcloth.   5. Apply the CHG Soap to your body ONLY FROM THE NECK DOWN.  Do not use on open wounds or open sores. Avoid contact with your eyes, ears, mouth and genitals (private parts). Wash Face and genitals (private parts)  with your normal soap.  6. Wash thoroughly, paying special attention to the area where your surgery will be performed.  7. Thoroughly rinse your body with warm water from the neck down.  8. DO NOT shower/wash with your normal soap after using and rinsing off the CHG Soap.  9. Pat yourself dry with a CLEAN TOWEL.  10. Wear CLEAN PAJAMAS to bed the night before surgery, wear comfortable clothes the  morning of surgery  11. Place CLEAN SHEETS on your bed the night of your first shower and DO NOT SLEEP WITH PETS.  Day of Surgery: Shower as instructed above.  Do not wear lotions, powders, or perfumes, or deodorant. Please wear clean clothes to the hospital/surgery center.   Remember to brush your teeth WITH YOUR REGULAR TOOTHPASTE.  Do not wear jewelry, make-up or nail polish.  Do not shave 48 hours prior to surgery.    Do not bring valuables to the hospital.  [facility] is not responsible for any belongings or valuables.  Contacts, dentures or bridgework may not be worn into surgery.  Leave your suitcase in the car.  After surgery it may be brought to your room.  For patients admitted to the hospital, discharge time will be determined by your treatment team.  Patients discharged the day of surgery will not be allowed to drive home.   Please read over the following fact sheets that you were given.

## 2020-02-24 ENCOUNTER — Other Ambulatory Visit: Payer: Self-pay

## 2020-02-24 ENCOUNTER — Other Ambulatory Visit (HOSPITAL_COMMUNITY)
Admission: RE | Admit: 2020-02-24 | Discharge: 2020-02-24 | Disposition: A | Discharge status: Home / Self Care | Payer: BC Managed Care – PPO | Source: Ambulatory Visit | Attending: Neurosurgery | Admitting: Neurosurgery

## 2020-02-24 ENCOUNTER — Encounter (HOSPITAL_COMMUNITY)
Admission: RE | Admit: 2020-02-24 | Discharge: 2020-02-24 | Disposition: A | Discharge status: Home / Self Care | Payer: BC Managed Care – PPO | Source: Ambulatory Visit | Attending: Neurosurgery | Admitting: Neurosurgery

## 2020-02-24 ENCOUNTER — Encounter (HOSPITAL_COMMUNITY): Payer: Self-pay

## 2020-02-24 DIAGNOSIS — Z20822 Contact with and (suspected) exposure to covid-19: Secondary | ICD-10-CM | POA: Diagnosis not present

## 2020-02-24 DIAGNOSIS — Z01818 Encounter for other preprocedural examination: Secondary | ICD-10-CM | POA: Insufficient documentation

## 2020-02-24 HISTORY — DX: Personal history of urinary calculi: Z87.442

## 2020-02-24 LAB — CBC WITH DIFFERENTIAL/PLATELET
Abs Immature Granulocytes: 0.03 10*3/uL (ref 0.00–0.07)
Basophils Absolute: 0.1 10*3/uL (ref 0.0–0.1)
Basophils Relative: 1 %
Eosinophils Absolute: 0.3 10*3/uL (ref 0.0–0.5)
Eosinophils Relative: 3 %
HCT: 42.3 % (ref 36.0–46.0)
Hemoglobin: 13.6 g/dL (ref 12.0–15.0)
Immature Granulocytes: 0 %
Lymphocytes Relative: 24 %
Lymphs Abs: 2.1 10*3/uL (ref 0.7–4.0)
MCH: 28.8 pg (ref 26.0–34.0)
MCHC: 32.2 g/dL (ref 30.0–36.0)
MCV: 89.4 fL (ref 80.0–100.0)
Monocytes Absolute: 0.5 10*3/uL (ref 0.1–1.0)
Monocytes Relative: 5 %
Neutro Abs: 5.7 10*3/uL (ref 1.7–7.7)
Neutrophils Relative %: 67 %
Platelets: 238 10*3/uL (ref 150–400)
RBC: 4.73 MIL/uL (ref 3.87–5.11)
RDW: 13.2 % (ref 11.5–15.5)
WBC: 8.5 10*3/uL (ref 4.0–10.5)
nRBC: 0 % (ref 0.0–0.2)

## 2020-02-24 LAB — BASIC METABOLIC PANEL
Anion gap: 7 (ref 5–15)
BUN: 14 mg/dL (ref 6–20)
CO2: 26 mmol/L (ref 22–32)
Calcium: 8.9 mg/dL (ref 8.9–10.3)
Chloride: 105 mmol/L (ref 98–111)
Creatinine, Ser: 0.54 mg/dL (ref 0.44–1.00)
GFR calc Af Amer: >60 mL/min (ref >60)
GFR calc non Af Amer: >60 mL/min (ref >60)
Glucose, Bld: 123 mg/dL — ABNORMAL HIGH (ref 70–99)
Potassium: 3.6 mmol/L (ref 3.5–5.1)
Sodium: 138 mmol/L (ref 135–145)

## 2020-02-24 LAB — SURGICAL PCR SCREEN
MRSA, PCR: NEGATIVE
Staphylococcus aureus: NEGATIVE

## 2020-02-24 LAB — SARS CORONAVIRUS 2 (TAT 6-24 HRS): SARS Coronavirus 2: NEGATIVE

## 2020-02-24 NOTE — Progress Notes (Signed)
PCP - Dr. Ethelene Hal Cardiologist -  Allergist - Dr. Maudie Mercury  PPM/ICD -   Device Orders -  Rep Notified -   Chest x-ray - 08/17/2019 EKG - 02/24/2020 Stress Test -  n/a ECHO - 07/21/2019 Cardiac Cath - n/a  Sleep Study - n/a CPAP -   Fasting Blood Sugar - n/a Checks Blood Sugar _____ times a day  Blood Thinner Instructions: n/a Aspirin Instructions:  ERAS Protcol - n/a PRE-SURGERY Ensure or G2-   COVID TEST- 02/24/2020   Anesthesia review: patient had an allergic reaction following an upper and lower endoscopy in January.  Patient had allergy testing done with Dr. Maudie Mercury at [facility] [place].  Patient denies shortness of breath, fever, cough and chest pain at PAT appointment   All instructions explained to the patient, with a verbal understanding of the material. Patient agrees to go over the instructions while at home for a better understanding. Patient also instructed to self quarantine after being tested for COVID-19. The opportunity to ask questions was provided.

## 2020-02-24 NOTE — Progress Notes (Signed)
Anesthesia PAT Evaluation:   Case: [id number] Date/Time: 02/28/20 0745   Procedure: Laminectomy and Foraminotomy - right - C6-C7 (Right ) - 3C   Anesthesia type: General   Pre-op diagnosis: Stenosis   [facility] ROOM 20 / [facility]   Surgeons: Pool, Henry, MD      DISCUSSION: Patient is a 54 year old female scheduled for the above procedure. I evaluated her during her 02/24/20 PAT visit.  History includes never smoker, post-operative N/V, HTN, asthma, dyspnea, anxiety, seizure (x1 30 years ago, has since been taken off phenobarbital), pancreatitis (1986, 07/2019), liver and pancreatic cysts (followed by serial MRI), chest pain (2011, cardiology evaluation Dr. Gollan, no stress test), hysterectomy (03/01/12), I&D LLE hematoma (07/19/13), L4-5 PLIF 05/01/15, Reexploration of posterior lateral fusion with removal of painful pedicle screw instrumentation 12/22/16) appendectomy (08/05/17), right shoulder arthroscopy (06/30/17), ACDF (01/2019).  Had delayed reaction after 09/28/19 EGD/colonoscopy: Received Zofran 4 mg, propofol 50 mg, normal saline 800 cc. (She denied having to use a pre-procedure oral rinse such as chlorhexidine.) She reported that around 5:00 PM that evening she developed mouth soreness and when she looked inside her mouth, she had "blisters and ulcers" inside her mouth, primarily on her tongue, sides of tongue, below her tongue, and swelling around her left eye. Symptoms lasted several days despite Benadryl and Magic mouthwash.  She did not have issues with breathing or's throat swelling.   She then had an allergy evaluation at [facility]. Reaction thought to be from contact dermatitis, so patch testing recommended looking at more metal and plastic substances. She was able to obtain the samples of the oxygen mask, pink tubing, mouthpiece, and mouth strap used during her endoscopy. Patient says allergies to these were negative. The only materials that were documented as  reactive were:  Patch Testing placed 11/28/19 First reading 11/30/19: 6. Fragrance Mix  1    26. Quinoline Mix  1    28. Gold Sodium Thiosulfate  --   +/-   Second reading 12/02/19: 6. Fragrance Mix 1    26. Quinoline Mix 1      T.R.U.E. Test - 11/30/19 1559    Time Antigen Placed  1647    Manufacturer  [employer]    Lot #  [id number]    Location  Back    Number of Test  36    Reading Interval  Day 1    Panel  Panel 1;Panel 2;Panel 3    1. Nickel Sulfate  0    2. Wool Alcohols  0    3. Neomycin Sulfate  0    4. Potassium Dichromate  0    5. Caine Mix  0    6. Fragrance Mix  1    7. Colophony  0    8. Paraben Mix  0    9. Negative Control  0    10. Balsam of [place]  0    11. Ethylenediamine Dihydrochloride  0    12. Cobalt Dichloride  0    13. p-tert Butylphenol Formaldehyde Resin  0    14. Epoxy Resin  0    15. Carba Mix  0    16.  Black Rubber Mix  0    17. Cl+ Me-Isothiazolinone  0    18. Quaternium-15  0    19. Methyldibromo Glutaronitrile  0    20. p-Phenylenediamine  0    21. Formaldehyde  0    22. Mercapto Mix    0    23. Thimerosal  0    24. Thiuram Mix  0    25. Diazolidinyl Urea  0    26. Quinoline Mix  1    27. Tixocortol-21-Pivalate  0    28. Gold Sodium Thiosulfate  --   +/-   29. Imidazolidinyl Urea  0    30. Budesonide  0    31. Hydrocortisone-17-Butyrate  0    32. Mercaptobenzothiazole  0    33. Bacitracin  0    34. Parthenolide  0    35. Disperse Blue 106  0    36. 2-Bromo-2-Nitropropane-1,3-diol  0         Metals Patch - 11/30/19 1600    Time Antigen Placed  1649    Manufacturer  [employer]    Location  Back    Reading Interval  Day 1    Select  Select    Aluminum Hydroxide 10%  0    Chromium chloride 1%  0    Cobalt chloride hexahydrate 1%  0    Molybdenum chloride 0.5%  0    Nickel sulfate hexahydrate 5%  0    Potassium dichromate 0.25%  0    Copper sulfate pentahydrate 2%  0     Tantal 1%  0    Titanium 0.1%  0    Manganese chloride 0.5%  0    Vanadium Pentoxide 10%  0    Other  0    Other  0        She reported that Quinoline Mix can be found in some plastic and rubber materials and is used in the "purple top" wipes at that hospital (active ingredients on [employer] Germicidal Disposable Wipe listed n-Alkyl, dimethyl ethylbenzyl ammonium chlorides, isopropyl alcohol). She said it was also in the toothpaste she was using which she since stopped using. At [facility], it states that "Quinolines are a group of synthetic antibacterial agents that may be used in combination with corticosteroids to treat skin infection such as eczema, athlete's foot, jock itch and ringworm.  It may also be used as an anti-infective, anti-fungal and anti-bacterial agents to treat gastrointestinal and vaginal infections. Quinoline mix contains the following allergens: Clioquinol Chlorquinaldol" Recommendation is to "Avoid products that list any of the following names in the ingredients: Clioquinol, Chlorquinaldol, Chloriodquin, Iodochlorhydroxyquin, Sterosan, Vioform."  She reports progressive environmental allergies as an adult, but no known food allergies. She does itch with codeine and Dilaudid, but has tolerated morphine and fentanyl in the past. She had not had an issue after anesthesia until her January 2021 endoscopy.   She denied SOB and chest pain. Denied known cardiac issues. Says she swims at the pool for an hour several days a week and had been doing tai chi twice a week.  She just finished a steroid taper due to her right shoulder/arm pain. She also recently fell and is wearing a left knee brace (Baker's cyst). She does see pain management, and is on Opana (oxymorphone). She has been having some generalized fluid retention with her recent prednisone.   I spoke with patient on 02/24/20 with review of post-endoscopy reaction with anesthesiologist Hodierne,  Adam, MD. I also called her back on 02/27/20. I offered to send patient information on anticipated airway equipment that could be utilized for her procedure, but she declined stating she was too overwhelmed with everything to do additional research. We did discuss that although she told me Quinoline Mix was found in plastics   and rubber, that I had found that it was classified as a synthetic anti-bacterial agent. I was able to find active ingredients on [employer] as discussed above and information on [employer] indicates it is a "tinted film coated with a highly pigmented synthetic rubber adhesive" that is latex-free and has a "zinc oxide-based adhesive". I updated anesthesiologist Fitzgerald, Edmond, MD. Could consider pre-medicating with Benadryl, but would defer to assigned anesthesiologist.     VS: BP 140/76   Pulse (!) 117   Temp 36.9 C   Resp 20   Ht 5' 3" (1.6 m)   Wt 85.2 kg   LMP 12/21/2011   SpO2 100%   BMI 33.28 kg/m   PROVIDERS: Kremer, William Alfred, MD  Kim, Yoon, MD & Gallagher, Joel, MD & Padgett, Shaylar, MD are Allergists ([facility]) Perry, John, MD is GI   LABS: Labs reviewed: Acceptable for surgery. (all labs ordered are listed, but only abnormal results are displayed)  Labs Reviewed  BASIC METABOLIC PANEL - Abnormal; Notable for the following components:      Result Value   Glucose, Bld 123 (*)    All other components within normal limits  SURGICAL PCR SCREEN  CBC WITH DIFFERENTIAL/PLATELET    OTHER: EGD 09/28/19: Impression: - LA Grade A reflux esophagitis. - Normal stomach. - Normal examined duodenum. - No specimens collected.  Colonoscopy 09/28/19: Impression: Three 3 to 4 mm polyps in the transverse colon and in the cecum, removed with a cold snare. Resected and retrieved. - The examination was otherwise normal on direct and retroflexion views.   IMAGES: MRI Right shoulder 02/15/20: IMPRESSION: - Rotator cuff and  intra-articular long head of biceps tendinopathy without tear. - Status post acromioplasty and resection of the acromioclavicular joint. - Moderate glenohumeral osteoarthritis with associated degeneration of the labrum. - Subcutaneous cystic lesion superior to the shoulder present on the prior MRI is no longer present.  MRI Abd 01/11/20: IMPRESSION: 1. Macrocystic lesion within head of pancreas measuring 2 cm is stable compared with previous exam. Continued interval follow-up imaging is recommended. The next follow-up exam should be obtained in 6 months with repeat pancreas protocol MRI without and with contrast. 2. There are new smaller cystic foci within the pancreas including 7 mm lesion along the tail of pancreas. These are nonspecific and may also represent pseudocyst status post pancreatitis. Attention on follow-up imaging is advised. 3. Right adrenal gland adenoma. 4.  Aortic Atherosclerosis (ICD10-I70.0). 5. Multiple liver cysts are again noted including dominant lobe cyst measuring 8 cm, image number 10/4. No suspicious liver abnormality identified.   EKG: Normal sinus rhythm Low voltage QRS Left posterior fascicular block Possible Inferior infarct , age undetermined Cannot rule out Anterior infarct , age undetermined Abnormal ECG Since last tracing Cannot rule out Inferior infarct , age undetermined Confirmed by Elliston, Tiffany ([id number]) on 02/25/2020 6:22:59 PM - Possible loss of lead II lead based on EKG tracing (very low voltage in II) when compared to 08/17/19 tracing. Reviewed EKG with anesthesiologist Hodierne, Adam, MD.    CV: Echo 07/21/19: IMPRESSIONS  1. Left ventricular ejection fraction, by visual estimation, is 55 to  60%. The left ventricle has normal function. There is no left ventricular  hypertrophy.  2. Left ventricular diastolic parameters are indeterminate.  3. The left ventricle has no regional wall motion abnormalities.  4. Global right  ventricle has normal systolic function.The right  ventricular size is normal. No increase in right ventricular wall  thickness.    5. Left atrial size was normal.  6. Right atrial size was normal.  7. Presence of pericardial fat pad.  8. Trivial pericardial effusion is present.  9. Mild mitral annular calcification.  10. The mitral valve is grossly normal. Trace mitral valve regurgitation.  11. The tricuspid valve is grossly normal. Tricuspid valve regurgitation  is trivial.  12. The aortic valve is tricuspid. Aortic valve regurgitation is not  visualized. No evidence of aortic valve sclerosis or stenosis.  13. The pulmonic valve was grossly normal. Pulmonic valve regurgitation is  not visualized.  14. Normal pulmonary artery systolic pressure.  15. The tricuspid regurgitant velocity is 2.19 m/s, and with an assumed  right atrial pressure of 8 mmHg, the estimated right ventricular systolic  pressure is normal at 27.2 mmHg.  16. The inferior vena cava is normal in size with <50% respiratory  variability, suggesting right atrial pressure of 8 mmHg.    Past Medical History:  Diagnosis Date  . Allergy   . Anginal pain ([place])    one episode 4-5 years ago-[facility]-nonspecific-no problems since-  . Anxiety   . Arthritis    multiple areas of arthritis- DDD  . Asthma    recent admit to ER 01/11/12 for exac of asthma  . Calcaneal fracture   . Duplicated ureter, right   . Fibromyalgia   . History of kidney stones   . Hypertension   . Obesity (BMI 30.0-34.9)   . Pancreatitis 1986  . PONV (postoperative nausea and vomiting)   . Poor venous access    hard to start iv-  . 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 Shortness of breath dyspnea   . Wears glasses     Past Surgical History:  Procedure Laterality Date  . ABDOMINAL HYSTERECTOMY    . ANTERIOR LAT  LUMBAR FUSION Left 08/17/2018   Procedure: LUMBAR THREE-FOUR ANTERIOR LATERAL INTERBODY FUSION;  Surgeon: Earnie Larsson, MD;  Location: [facility];  Service: Neurosurgery;  Laterality: Left;  . APPENDECTOMY  08/05/2017  . BLADDER SUSPENSION  03/01/2012   Procedure: TRANSVAGINAL TAPE (TVT) PROCEDURE;  Surgeon: Emily Filbert, MD;  Location: [facility];  Service: Gynecology;  Laterality: N/A;  . BUNIONECTOMY  1995   Right  . CHOLECYSTECTOMY  late 1980's      . CYSTOSCOPY  03/01/2012   Procedure: CYSTOSCOPY;  Surgeon: Emily Filbert, MD;  Location: [facility];  Service: Gynecology;  Laterality: N/A;  . CYSTOSCOPY W/ URETERAL STENT PLACEMENT Left 12/15/2014   Procedure: CYSTOSCOPY, LEFT URETEROSCOPYU WITH STONE EXTRACTION;  Surgeon: Irine Seal, MD;  Location: [facility];  Service: Urology;  Laterality: Left;  . CYSTOSCOPY W/ URETERAL STENT PLACEMENT Left 10/02/2016   Procedure: CYSTOSCOPY WITH RETROGRADE PYELOGRAM/URETERAL STENT PLACEMENT ureteroscopy, basket extraction;  Surgeon: Cleon Gustin, MD;  Location: [facility];  Service: Urology;  Laterality: Left;  . HARDWARE REMOVAL N/A 12/22/2016   Procedure: Lumbar Four-Five Removal of Hardware;  Surgeon: Earnie Larsson, MD;  Location: [facility];  Service: Neurosurgery;  Laterality: N/A;  . INCISION AND DRAINAGE ABSCESS Left 07/19/2013   Procedure: INCISION AND DRAINAGE LEFT LOWER EXTERMITY HEMATOMA;  Surgeon: Imogene Burn. Georgette Dover, MD;  Location: [facility];  Service: General;  Laterality: Left;  Excision left lower leg mass  . KNEE CARTILAGE SURGERY     left  . KNEE SURGERY Left    x  2, Murphy/Sue, corrected Patella displacement   . LAMINECTOMY    . LAPAROSCOPIC APPENDECTOMY N/A 08/05/2017   Procedure: APPENDECTOMY LAPAROSCOPIC;  Surgeon: Kinsinger, Arta Bruce, MD;  Location: [facility];  Service: General;  Laterality: N/A;  . LUMBAR LAMINECTOMY  10/06   L4-5, Ray  . LUMBAR PERCUTANEOUS PEDICLE SCREW 1 LEVEL Left 08/17/2018   Procedure: LUMBAR THREE-FOUR LUMBAR  PERCUTANEOUS PEDICLE SCREW;  Surgeon: Earnie Larsson, MD;  Location: [place];  Service: Neurosurgery;  Laterality: Left;  . R compressed pronator Right    Sypher, R arm  . RESECTION DISTAL CLAVICAL Right 06/30/2017   Procedure: RIGHT RESECTION DISTAL CLAVICAL;  Surgeon: Melrose Nakayama, MD;  Location: [facility];  Service: Orthopedics;  Laterality: Right;  . SHOULDER ACROMIOPLASTY Right 06/30/2017   Procedure: RIGHT SHOULDER ACROMIOPLASTY;  Surgeon: Melrose Nakayama, MD;  Location: [place];  Service: Orthopedics;  Laterality: Right;  . SHOULDER ARTHROSCOPY Right 06/30/2017   Procedure: ARTHROSCOPY DEBRIDEMENT RIGHT SHOULDER;  Surgeon: Melrose Nakayama, MD;  Location: [facility];  Service: Orthopedics;  Laterality: Right;  . SPINAL FUSION    . TENDON REPAIR  2013  . TENDON REPAIR Right 02/01/12  . TONSILLECTOMY  age 71  . TONSILLECTOMY      MEDICATIONS: . albuterol (PROAIR HFA) 108 (90 Base) MCG/ACT inhaler  . cetirizine (ZYRTEC) 10 MG tablet  . cyclobenzaprine (FLEXERIL) 5 MG tablet  . doxepin (SINEQUAN) 10 MG capsule  . DULoxetine (CYMBALTA) 60 MG capsule  . EPINEPHrine 0.3 mg/0.3 mL IJ SOAJ injection  . estrogens, conjugated, (PREMARIN) 0.3 MG tablet  . fluticasone (FLONASE) 50 MCG/ACT nasal spray  . ibuprofen (ADVIL,MOTRIN) 200 MG tablet  . losartan (COZAAR) 50 MG tablet  . magic mouthwash w/lidocaine SOLN  . Olopatadine HCl (PATADAY) 0.2 % SOLN  . oxymorphone (OPANA) 5 MG tablet  . pantoprazole (PROTONIX) 40 MG tablet  . triamterene-hydrochlorothiazide (DYAZIDE) 37.5-25 MG capsule  . zolpidem (AMBIEN) 10 MG tablet   No current facility-administered medications for this encounter.     Myra Gianotti, PA-C Surgical Short Stay/Anesthesiology [facility] Phone [phone] [facility] Phone [phone] 02/27/2020 8:25 PM

## 2020-02-27 NOTE — Anesthesia Preprocedure Evaluation (Addendum)
Anesthesia Evaluation  Patient identified by MRN, date of birth, ID band Patient awake    Reviewed: Allergy & Precautions, NPO status , Patient's Chart, lab work & pertinent test results  History of Anesthesia Complications (+) PONV and history of anesthetic complications  Airway Mallampati: III  TM Distance: >3 FB Neck ROM: Full    Dental  (+) Dental Advisory Given, Teeth Intact   Pulmonary shortness of breath, asthma , COPD,    breath sounds clear to auscultation       Cardiovascular hypertension, Pt. on medications (-) angina(-) Past MI and (-) CHF  Rhythm:Regular  1. Left ventricular ejection fraction, by visual estimation, is 55 to  60%. The left ventricle has normal function. There is no left ventricular  hypertrophy.  2. Left ventricular diastolic parameters are indeterminate.  3. The left ventricle has no regional wall motion abnormalities.  4. Global right ventricle has normal systolic function.The right  ventricular size is normal. No increase in right ventricular wall  thickness.  5. Left atrial size was normal.  6. Right atrial size was normal.  7. Presence of pericardial fat pad.  8. Trivial pericardial effusion is present.  9. Mild mitral annular calcification.  10. The mitral valve is grossly normal. Trace mitral valve regurgitation.  11. The tricuspid valve is grossly normal. Tricuspid valve regurgitation  is trivial.  12. The aortic valve is tricuspid. Aortic valve regurgitation is not  visualized. No evidence of aortic valve sclerosis or stenosis.  13. The pulmonic valve was grossly normal. Pulmonic valve regurgitation is  not visualized.  14. Normal pulmonary artery systolic pressure.  15. The tricuspid regurgitant velocity is 2.19 m/s, and with an assumed  right atrial pressure of 8 mmHg, the estimated right ventricular systolic  pressure is normal at 27.2 mmHg.  16. The inferior vena cava is  normal in size with <50% respiratory  variability, suggesting right atrial pressure of 8 mmHg.    Neuro/Psych  Headaches, PSYCHIATRIC DISORDERS Anxiety Depression  Neuromuscular disease    GI/Hepatic Neg liver ROS, GERD  ,  Endo/Other  negative endocrine ROS  Renal/GU Renal disease     Musculoskeletal  (+) Arthritis , Fibromyalgia -  Abdominal   Peds  Hematology negative hematology ROS (+)   Anesthesia Other Findings History includes never smoker, post-operative N/V, HTN, asthma, dyspnea, anxiety, seizure (x1 30 years ago, has since been taken off phenobarbital), pancreatitis (1986, 07/2019), liver and pancreatic cysts (followed by serial MRI), chest pain (2011, cardiology evaluation Dr. Rockey Situ, no stress test), hysterectomy (03/01/12), I&D LLE hematoma (07/19/13), L4-5 PLIF 05/01/15, Reexploration of posterior lateral fusion with removal of painful pedicle screw instrumentation 12/22/16) appendectomy (08/05/17), right shoulder arthroscopy (06/30/17), ACDF (01/2019).  Had delayed reaction after 09/28/19 EGD/colonoscopy: Received Zofran 4 mg, propofol 50 mg, normal saline 800 cc. (She denied having to use a pre-procedure oral rinse such as chlorhexidine.) She reported that around 5:00 PM that evening she developed mouth soreness and when she looked inside her mouth, she had "blisters and ulcers" inside her mouth, primarily on her tongue, sides of tongue, below her tongue, and swelling around her left eye. Symptoms lasted several days despite Benadryl and Magic mouthwash.  She did not have issues with breathing or's throat swelling.   She then had an allergy evaluation at [facility]. Reaction thought to be from contact dermatitis, so patch testing recommended looking at more metal and plastic substances. She was able to obtain the samples of the oxygen  mask, pink tubing, mouthpiece, and mouth strap used during her endoscopy. Patient says allergies to these were negative. The  only materials that were documented as reactive were:  Patch Testing placed 11/28/19 First reading 11/30/19: 6. Fragrance Mix 1   26. Quinoline Mix 1   28. Gold Sodium Thiosulfate --  +/-  Second reading 12/02/19: 6. Fragrance Mix 1   26. Quinoline Mix 1     T.R.U.E. Test - 11/30/19 1559  Time Antigen Placed [place]   Lot # [id number]   Location Back   Number of Test 36   Reading Interval Day 1   Panel Panel 1;Panel 2;Panel 3   1. Nickel Sulfate 0   2. Wool Alcohols 0   3. Neomycin Sulfate 0   4. Potassium Dichromate 0   5. Caine Mix 0   6. Fragrance Mix 1   7. Colophony 0   8. Paraben Mix 0   9. Negative Control 0   10. Balsam of [place] 0   11. Ethylenediamine Dihydrochloride 0   12. Cobalt Dichloride 0   13. p-tert Butylphenol Formaldehyde Resin 0   14. Epoxy Resin 0   15. Carba Mix 0   16. Black Rubber Mix 0   17. Cl+ Me-Isothiazolinone 0   18. Quaternium-15 0   19. Methyldibromo Glutaronitrile 0   20. p-Phenylenediamine 0   21. Formaldehyde 0   22. Mercapto Mix 0   23. Thimerosal 0   24. Thiuram Mix 0   25. Diazolidinyl Urea 0   26. Quinoline Mix 1   27. Tixocortol-21-Pivalate 0   28. Gold Sodium Thiosulfate --  +/-  29. Imidazolidinyl Urea 0   30. Budesonide 0   31. Hydrocortisone-17-Butyrate 0   32. Mercaptobenzothiazole 0   33. Bacitracin 0   34. Parthenolide 0   35. Disperse Blue 106 0   36. 2-Bromo-2-Nitropropane-1,3-diol 0         Metals Patch - 11/30/19 1600  Time Antigen Placed 1649   Manufacturer Greer   Location Back   Reading Interval Day 1   Select Select   Aluminum Hydroxide 10% 0   Chromium chloride 1% 0   Cobalt chloride hexahydrate 1% 0   Molybdenum chloride 0.5% 0   Nickel sulfate hexahydrate 5% 0   Potassium dichromate 0.25% 0   Copper sulfate pentahydrate 2% 0   Tantal 1% 0    Titanium 0.1% 0   Manganese chloride 0.5% 0   Vanadium Pentoxide 10% 0   Other 0   Other 0      She reported that Quinoline Mix can be found in some plastic and rubber materials and is used in the "purple top" wipes at that hospital (active ingredients on Super [employer]i-Cloth Germicidal Disposable Wipe listed n-Alkyl, dimethyl ethylbenzyl ammonium chlorides, isopropyl alcohol). She said it was also in the toothpaste she was using which she since stopped using. At [facility], it states that "Quinolines are a group of synthetic antibacterial agents that may be used in combination with corticosteroids to treat skin infection such as eczema, athlete's foot, jock itch and ringworm.  It may also be used as an anti-infective, anti-fungal and anti-bacterial agents to treat gastrointestinal and vaginal infections. Quinoline mix contains the following allergens: Clioquinol Chlorquinaldol" Recommendation is to "Avoid products that list any of the following names in the ingredients: Clioquinol, Chlorquinaldol, Chloriodquin, Iodochlorhydroxyquin, Sterosan, Vioform."  She reports progressive environmental allergies as an adult, but no known food allergies. She does itch with  codeine and Dilaudid, but has tolerated morphine and fentanyl in the past. She had not had an issue after anesthesia until her January 2021 endoscopy.   She denied SOB and chest pain. Denied known cardiac issues. Says she swims at the pool for an hour several days a week and had been doing tai chi twice a week.  She just finished a steroid taper due to her right shoulder/arm pain. She also recently fell and is wearing a left knee brace (Baker's cyst). She does see pain management, and is on Opana (oxymorphone). She has been having some generalized fluid retention with her recent prednisone.   I spoke with patient on 02/24/20 with review of post-endoscopy reaction with anesthesiologist Albertha Ghee, MD. I  also called her back on 02/27/20. I offered to send patient information on anticipated airway equipment that could be utilized for her procedure, but she declined stating she was too overwhelmed with everything to do additional research. We did discuss that although she told me Quinoline Mix was found in plastics and rubber, that I had found that it was classified as a synthetic anti-bacterial agent. I was able to find active ingredients on Super Sani-Wipes as discussed above and information on [employer] indicates it is a "tinted film coated with a highly pigmented synthetic rubber adhesive" that is latex-free and has a "zinc oxide-based adhesive". I updated anesthesiologist Oren Bracket, MD. Could consider pre-medicating with Benadryl, but would defer to assigned anesthesiologist.    Reproductive/Obstetrics                            Anesthesia Physical Anesthesia Plan  ASA: II  Anesthesia Plan: General   Post-op Pain Management:    Induction: Intravenous  PONV Risk Score and Plan: 4 or greater and Ondansetron, Dexamethasone, Diphenhydramine and Propofol infusion  Airway Management Planned: Oral ETT and Video Laryngoscope Planned  Additional Equipment: None  Intra-op Plan:   Post-operative Plan: Extubation in OR  Informed Consent: I have reviewed the patients History and Physical, chart, labs and discussed the procedure including the risks, benefits and alternatives for the proposed anesthesia with the patient or authorized representative who has indicated his/her understanding and acceptance.     Dental advisory given  Plan Discussed with: CRNA and Surgeon  Anesthesia Plan Comments: (See PAT note written by Myra Gianotti, PA-C and information about allergic reaction following endoscopy with patch test positive for fragrance mix (1+) and Quinoline mix (1+). Gold Sodium Thiosulfate was +/- at 48 hours, but not listed as reactive at 96 hour check. Samples  of oxygen mask, pink tubing, mouthpiece, mouth strap from endoscopy center also tested but not listed as being reactive.  Propofol was used for EGD/colonoscopy.   She reported localized hives on thigh when "pink tape" used to secure a foley catheter in 1986. She reported Quinoline mix was found in "purple top wipes" but could not remember the source. Active ingredients on Super Sani-Wipes are n-Alkyl, dimethyl ethylbenzyl ammonium chlorides, isopropyl alcohol.   No previous allergic reaction after anesthesia. Had lumbar fusion at [facility] in 2018 and ACDF at surgical center 2020.   )     Anesthesia Quick Evaluation

## 2020-02-28 ENCOUNTER — Ambulatory Visit (HOSPITAL_COMMUNITY): Payer: BC Managed Care – PPO | Admitting: Vascular Surgery

## 2020-02-28 ENCOUNTER — Other Ambulatory Visit: Payer: Self-pay

## 2020-02-28 ENCOUNTER — Encounter (HOSPITAL_COMMUNITY): Payer: Self-pay | Admitting: Neurosurgery

## 2020-02-28 ENCOUNTER — Other Ambulatory Visit: Payer: Self-pay | Admitting: Family Medicine

## 2020-02-28 ENCOUNTER — Ambulatory Visit (HOSPITAL_COMMUNITY): Payer: BC Managed Care – PPO

## 2020-02-28 ENCOUNTER — Observation Stay (HOSPITAL_COMMUNITY)
Admission: RE | Admit: 2020-02-28 | Discharge: 2020-02-29 | Disposition: A | Discharge status: Home / Self Care | Payer: BC Managed Care – PPO | Attending: Neurosurgery | Admitting: Neurosurgery

## 2020-02-28 ENCOUNTER — Ambulatory Visit (HOSPITAL_COMMUNITY): Payer: BC Managed Care – PPO | Admitting: Certified Registered"

## 2020-02-28 ENCOUNTER — Encounter (HOSPITAL_COMMUNITY)
Admission: RE | Disposition: A | Discharge status: Home / Self Care | Payer: Self-pay | Source: Home / Self Care | Attending: Neurosurgery

## 2020-02-28 DIAGNOSIS — M5412 Radiculopathy, cervical region: Secondary | ICD-10-CM | POA: Diagnosis present

## 2020-02-28 DIAGNOSIS — M797 Fibromyalgia: Secondary | ICD-10-CM | POA: Diagnosis not present

## 2020-02-28 DIAGNOSIS — J45909 Unspecified asthma, uncomplicated: Secondary | ICD-10-CM | POA: Insufficient documentation

## 2020-02-28 DIAGNOSIS — K219 Gastro-esophageal reflux disease without esophagitis: Secondary | ICD-10-CM | POA: Diagnosis not present

## 2020-02-28 DIAGNOSIS — Z79899 Other long term (current) drug therapy: Secondary | ICD-10-CM | POA: Diagnosis not present

## 2020-02-28 DIAGNOSIS — F419 Anxiety disorder, unspecified: Secondary | ICD-10-CM | POA: Insufficient documentation

## 2020-02-28 DIAGNOSIS — Z419 Encounter for procedure for purposes other than remedying health state, unspecified: Secondary | ICD-10-CM

## 2020-02-28 DIAGNOSIS — Z79890 Hormone replacement therapy (postmenopausal): Secondary | ICD-10-CM | POA: Insufficient documentation

## 2020-02-28 DIAGNOSIS — F329 Major depressive disorder, single episode, unspecified: Secondary | ICD-10-CM | POA: Diagnosis not present

## 2020-02-28 DIAGNOSIS — M4802 Spinal stenosis, cervical region: Principal | ICD-10-CM | POA: Insufficient documentation

## 2020-02-28 DIAGNOSIS — E669 Obesity, unspecified: Secondary | ICD-10-CM | POA: Insufficient documentation

## 2020-02-28 DIAGNOSIS — I10 Essential (primary) hypertension: Secondary | ICD-10-CM | POA: Diagnosis not present

## 2020-02-28 HISTORY — PX: POSTERIOR CERVICAL LAMINECTOMY: SHX2248

## 2020-02-28 SURGERY — POSTERIOR CERVICAL LAMINECTOMY
Anesthesia: General | Site: Spine Cervical | Laterality: Right

## 2020-02-28 MED ORDER — LIDOCAINE 2% (20 MG/ML) 5 ML SYRINGE
INTRAMUSCULAR | Status: AC
  Filled 2020-02-28: qty 5

## 2020-02-28 MED ORDER — CEFAZOLIN SODIUM-DEXTROSE 1-4 GM/50ML-% IV SOLN
1.0000 g | Freq: Three times a day (TID) | INTRAVENOUS | Status: AC
  Administered 2020-02-28 (×2): 1 g via INTRAVENOUS
  Filled 2020-02-28 (×2): qty 50

## 2020-02-28 MED ORDER — MIDAZOLAM HCL 5 MG/5ML IJ SOLN
INTRAMUSCULAR | Status: DC | PRN
  Administered 2020-02-28: 2 mg via INTRAVENOUS

## 2020-02-28 MED ORDER — KETAMINE HCL 10 MG/ML IJ SOLN
INTRAMUSCULAR | Status: DC | PRN
  Administered 2020-02-28: 30 mg via INTRAVENOUS
  Administered 2020-02-28: 10 mg via INTRAVENOUS

## 2020-02-28 MED ORDER — NEOMYCIN-POLYMYXIN-PRAMOXINE 1 % EX CREA
TOPICAL_CREAM | Freq: Two times a day (BID) | CUTANEOUS | Status: DC
  Filled 2020-02-28: qty 28

## 2020-02-28 MED ORDER — CYCLOBENZAPRINE HCL 5 MG PO TABS
5.0000 mg | ORAL_TABLET | Freq: Three times a day (TID) | ORAL | Status: DC | PRN
  Administered 2020-02-28: 5 mg via ORAL
  Filled 2020-02-28 (×3): qty 1

## 2020-02-28 MED ORDER — OLOPATADINE HCL 0.1 % OP SOLN
1.0000 [drp] | Freq: Two times a day (BID) | OPHTHALMIC | Status: DC | PRN
  Filled 2020-02-28: qty 5

## 2020-02-28 MED ORDER — HYDROMORPHONE HCL 1 MG/ML IJ SOLN
1.0000 mg | INTRAMUSCULAR | Status: DC | PRN
  Administered 2020-02-28 (×2): 1 mg via INTRAVENOUS
  Filled 2020-02-28 (×2): qty 1

## 2020-02-28 MED ORDER — OXYCODONE HCL 5 MG PO TABS
ORAL_TABLET | ORAL | Status: AC
  Filled 2020-02-28: qty 1

## 2020-02-28 MED ORDER — FLUTICASONE PROPIONATE 50 MCG/ACT NA SUSP
2.0000 | Freq: Every day | NASAL | Status: DC
  Filled 2020-02-28: qty 16

## 2020-02-28 MED ORDER — ALBUTEROL SULFATE HFA 108 (90 BASE) MCG/ACT IN AERS: 2.0000 | INHALATION_SPRAY | RESPIRATORY_TRACT | Status: DC | PRN

## 2020-02-28 MED ORDER — ONDANSETRON HCL 4 MG/2ML IJ SOLN
INTRAMUSCULAR | Status: AC
  Filled 2020-02-28: qty 2

## 2020-02-28 MED ORDER — PHENOL 1.4 % MT LIQD: 1.0000 | OROMUCOSAL | Status: DC | PRN

## 2020-02-28 MED ORDER — DEXAMETHASONE SODIUM PHOSPHATE 10 MG/ML IJ SOLN
INTRAMUSCULAR | Status: AC
  Filled 2020-02-28: qty 1

## 2020-02-28 MED ORDER — CEFAZOLIN SODIUM-DEXTROSE 2-4 GM/100ML-% IV SOLN
2.0000 g | INTRAVENOUS | Status: AC
  Administered 2020-02-28: 2 mg via INTRAVENOUS
  Filled 2020-02-28: qty 100

## 2020-02-28 MED ORDER — ACETAMINOPHEN 160 MG/5ML PO SOLN: 1000.0000 mg | Freq: Once | ORAL | Status: DC | PRN

## 2020-02-28 MED ORDER — KETOROLAC TROMETHAMINE 15 MG/ML IJ SOLN: 30.0000 mg | Freq: Four times a day (QID) | INTRAMUSCULAR | Status: DC

## 2020-02-28 MED ORDER — DEXAMETHASONE SODIUM PHOSPHATE 10 MG/ML IJ SOLN
10.0000 mg | Freq: Once | INTRAMUSCULAR | Status: AC
  Administered 2020-02-28: 10 mg via INTRAVENOUS
  Filled 2020-02-28: qty 1

## 2020-02-28 MED ORDER — ONDANSETRON HCL 4 MG PO TABS: 4.0000 mg | ORAL_TABLET | Freq: Four times a day (QID) | ORAL | Status: DC | PRN

## 2020-02-28 MED ORDER — FENTANYL CITRATE (PF) 100 MCG/2ML IJ SOLN
INTRAMUSCULAR | Status: AC
  Filled 2020-02-28: qty 2

## 2020-02-28 MED ORDER — HEMOSTATIC AGENTS (NO CHARGE) OPTIME
TOPICAL | Status: DC | PRN
  Administered 2020-02-28: 1 via TOPICAL

## 2020-02-28 MED ORDER — SODIUM CHLORIDE 0.9 % IV SOLN
250.0000 mL | INTRAVENOUS | Status: DC
  Administered 2020-02-28: 250 mL via INTRAVENOUS

## 2020-02-28 MED ORDER — CYCLOBENZAPRINE HCL 10 MG PO TABS
10.0000 mg | ORAL_TABLET | Freq: Three times a day (TID) | ORAL | Status: DC | PRN
  Administered 2020-02-28: 10 mg via ORAL
  Filled 2020-02-28: qty 1

## 2020-02-28 MED ORDER — KETOROLAC TROMETHAMINE 30 MG/ML IJ SOLN
INTRAMUSCULAR | Status: DC | PRN
Start: 2020-02-28 — End: 2020-02-28
  Administered 2020-02-28: 30 mg via INTRAVENOUS

## 2020-02-28 MED ORDER — ROCURONIUM BROMIDE 10 MG/ML (PF) SYRINGE
PREFILLED_SYRINGE | INTRAVENOUS | Status: DC | PRN
  Administered 2020-02-28: 80 mg via INTRAVENOUS

## 2020-02-28 MED ORDER — ALBUTEROL SULFATE (2.5 MG/3ML) 0.083% IN NEBU: 2.5000 mg | INHALATION_SOLUTION | RESPIRATORY_TRACT | Status: DC | PRN

## 2020-02-28 MED ORDER — SUGAMMADEX SODIUM 200 MG/2ML IV SOLN
INTRAVENOUS | Status: DC | PRN
  Administered 2020-02-28: 200 mg via INTRAVENOUS

## 2020-02-28 MED ORDER — ONDANSETRON HCL 4 MG/2ML IJ SOLN: 4.0000 mg | Freq: Four times a day (QID) | INTRAMUSCULAR | Status: DC | PRN

## 2020-02-28 MED ORDER — LACTATED RINGERS IV SOLN: INTRAVENOUS | Status: DC

## 2020-02-28 MED ORDER — ACETAMINOPHEN 325 MG PO TABS
650.0000 mg | ORAL_TABLET | ORAL | Status: DC | PRN
  Administered 2020-02-28 – 2020-02-29 (×4): 650 mg via ORAL
  Filled 2020-02-28 (×4): qty 2

## 2020-02-28 MED ORDER — MORPHINE SULFATE 15 MG PO TABS: 15.0000 mg | ORAL_TABLET | ORAL | Status: DC

## 2020-02-28 MED ORDER — THROMBIN 5000 UNITS EX SOLR
CUTANEOUS | Status: DC | PRN
  Administered 2020-02-28 (×2): 5000 [IU] via TOPICAL

## 2020-02-28 MED ORDER — PANTOPRAZOLE SODIUM 40 MG PO TBEC
40.0000 mg | DELAYED_RELEASE_TABLET | Freq: Two times a day (BID) | ORAL | Status: DC
  Administered 2020-02-28: 40 mg via ORAL
  Filled 2020-02-28: qty 1

## 2020-02-28 MED ORDER — LOSARTAN POTASSIUM 50 MG PO TABS: 50.0000 mg | ORAL_TABLET | Freq: Every day | ORAL | Status: DC

## 2020-02-28 MED ORDER — KETAMINE HCL 50 MG/5ML IJ SOSY
PREFILLED_SYRINGE | INTRAMUSCULAR | Status: AC
  Filled 2020-02-28: qty 5

## 2020-02-28 MED ORDER — BUPIVACAINE HCL (PF) 0.25 % IJ SOLN
INTRAMUSCULAR | Status: AC
  Filled 2020-02-28: qty 30

## 2020-02-28 MED ORDER — DULOXETINE HCL 60 MG PO CPEP
90.0000 mg | ORAL_CAPSULE | Freq: Every day | ORAL | Status: DC
  Filled 2020-02-28 (×2): qty 1

## 2020-02-28 MED ORDER — ROCURONIUM BROMIDE 10 MG/ML (PF) SYRINGE
PREFILLED_SYRINGE | INTRAVENOUS | Status: AC
  Filled 2020-02-28: qty 20

## 2020-02-28 MED ORDER — TRIAMTERENE-HCTZ 37.5-25 MG PO TABS
1.0000 | ORAL_TABLET | Freq: Every day | ORAL | Status: DC
  Filled 2020-02-28: qty 1

## 2020-02-28 MED ORDER — ORAL CARE MOUTH RINSE: 15.0000 mL | Freq: Once | OROMUCOSAL | Status: AC

## 2020-02-28 MED ORDER — BACITRACIN ZINC 500 UNIT/GM EX OINT
TOPICAL_OINTMENT | CUTANEOUS | Status: DC | PRN
  Administered 2020-02-28: 1 via TOPICAL

## 2020-02-28 MED ORDER — THROMBIN 5000 UNITS EX SOLR
CUTANEOUS | Status: AC
  Filled 2020-02-28: qty 5000

## 2020-02-28 MED ORDER — PROPOFOL 10 MG/ML IV BOLUS
INTRAVENOUS | Status: DC | PRN
  Administered 2020-02-28: 30 mg via INTRAVENOUS
  Administered 2020-02-28: 170 mg via INTRAVENOUS

## 2020-02-28 MED ORDER — MIDAZOLAM HCL 2 MG/2ML IJ SOLN
INTRAMUSCULAR | Status: AC
  Filled 2020-02-28: qty 2

## 2020-02-28 MED ORDER — PROPOFOL 10 MG/ML IV BOLUS
INTRAVENOUS | Status: AC
  Filled 2020-02-28: qty 20

## 2020-02-28 MED ORDER — CHLORHEXIDINE GLUCONATE CLOTH 2 % EX PADS: 6.0000 | MEDICATED_PAD | Freq: Once | CUTANEOUS | Status: DC

## 2020-02-28 MED ORDER — TRIAMTERENE-HCTZ 37.5-25 MG PO CAPS: 1.0000 | ORAL_CAPSULE | Freq: Every day | ORAL | Status: DC

## 2020-02-28 MED ORDER — FENTANYL CITRATE (PF) 100 MCG/2ML IJ SOLN
INTRAMUSCULAR | Status: DC | PRN
  Administered 2020-02-28: 150 ug via INTRAVENOUS
  Administered 2020-02-28: 50 ug via INTRAVENOUS

## 2020-02-28 MED ORDER — ACETAMINOPHEN 10 MG/ML IV SOLN: 1000.0000 mg | Freq: Once | INTRAVENOUS | Status: DC | PRN

## 2020-02-28 MED ORDER — DOXEPIN HCL 10 MG PO CAPS
10.0000 mg | ORAL_CAPSULE | Freq: Every day | ORAL | Status: DC
  Filled 2020-02-28 (×2): qty 1

## 2020-02-28 MED ORDER — OXYMORPHONE HCL 5 MG PO TABS: 5.0000 mg | ORAL_TABLET | ORAL | Status: DC

## 2020-02-28 MED ORDER — OXYCODONE HCL 5 MG PO TABS
5.0000 mg | ORAL_TABLET | Freq: Once | ORAL | Status: AC | PRN
  Administered 2020-02-28: 5 mg via ORAL

## 2020-02-28 MED ORDER — FENTANYL CITRATE (PF) 250 MCG/5ML IJ SOLN
INTRAMUSCULAR | Status: AC
  Filled 2020-02-28: qty 5

## 2020-02-28 MED ORDER — ACETAMINOPHEN 10 MG/ML IV SOLN
INTRAVENOUS | Status: AC
  Filled 2020-02-28: qty 100

## 2020-02-28 MED ORDER — ONDANSETRON HCL 4 MG/2ML IJ SOLN
INTRAMUSCULAR | Status: DC | PRN
  Administered 2020-02-28: 4 mg via INTRAVENOUS

## 2020-02-28 MED ORDER — BUPIVACAINE HCL (PF) 0.25 % IJ SOLN
INTRAMUSCULAR | Status: DC | PRN
  Administered 2020-02-28: 10 mL

## 2020-02-28 MED ORDER — OXYCODONE HCL 5 MG/5ML PO SOLN: 5.0000 mg | Freq: Once | ORAL | Status: AC | PRN

## 2020-02-28 MED ORDER — PHENYLEPHRINE HCL-NACL 10-0.9 MG/250ML-% IV SOLN
INTRAVENOUS | Status: DC | PRN
Start: 2020-02-28 — End: 2020-02-28
  Administered 2020-02-28: 50 ug/min via INTRAVENOUS

## 2020-02-28 MED ORDER — FENTANYL CITRATE (PF) 100 MCG/2ML IJ SOLN
25.0000 ug | INTRAMUSCULAR | Status: DC | PRN
  Administered 2020-02-28 (×2): 25 ug via INTRAVENOUS
  Administered 2020-02-28 (×2): 50 ug via INTRAVENOUS

## 2020-02-28 MED ORDER — PHENYLEPHRINE 40 MCG/ML (10ML) SYRINGE FOR IV PUSH (FOR BLOOD PRESSURE SUPPORT)
PREFILLED_SYRINGE | INTRAVENOUS | Status: AC
  Filled 2020-02-28: qty 10

## 2020-02-28 MED ORDER — THROMBIN 5000 UNITS EX SOLR: OROMUCOSAL | Status: DC | PRN

## 2020-02-28 MED ORDER — 0.9 % SODIUM CHLORIDE (POUR BTL) OPTIME
TOPICAL | Status: DC | PRN
  Administered 2020-02-28: 1000 mL

## 2020-02-28 MED ORDER — DIPHENHYDRAMINE HCL 50 MG/ML IJ SOLN
INTRAMUSCULAR | Status: AC
  Filled 2020-02-28: qty 1

## 2020-02-28 MED ORDER — CHLORHEXIDINE GLUCONATE 0.12 % MT SOLN
15.0000 mL | Freq: Once | OROMUCOSAL | Status: AC
  Administered 2020-02-28: 15 mL via OROMUCOSAL
  Filled 2020-02-28: qty 15

## 2020-02-28 MED ORDER — LIDOCAINE 2% (20 MG/ML) 5 ML SYRINGE
INTRAMUSCULAR | Status: DC | PRN
  Administered 2020-02-28: 80 mg via INTRAVENOUS

## 2020-02-28 MED ORDER — BACITRACIN ZINC 500 UNIT/GM EX OINT
TOPICAL_OINTMENT | CUTANEOUS | Status: AC
  Filled 2020-02-28: qty 28.35

## 2020-02-28 MED ORDER — DIPHENHYDRAMINE HCL 50 MG/ML IJ SOLN
INTRAMUSCULAR | Status: DC | PRN
Start: 2020-02-28 — End: 2020-02-28
  Administered 2020-02-28: 25 mg via INTRAVENOUS

## 2020-02-28 MED ORDER — SODIUM CHLORIDE 0.9% FLUSH
3.0000 mL | Freq: Two times a day (BID) | INTRAVENOUS | Status: DC
  Administered 2020-02-28: 3 mL via INTRAVENOUS

## 2020-02-28 MED ORDER — SODIUM CHLORIDE 0.9 % IV SOLN: INTRAVENOUS | Status: DC | PRN

## 2020-02-28 MED ORDER — GLYCOPYRROLATE 0.2 MG/ML IJ SOLN
INTRAMUSCULAR | Status: DC | PRN
Start: 2020-02-28 — End: 2020-02-28
  Administered 2020-02-28: .2 mg via INTRAVENOUS

## 2020-02-28 MED ORDER — ACETAMINOPHEN 500 MG PO TABS: 1000.0000 mg | ORAL_TABLET | Freq: Once | ORAL | Status: DC | PRN

## 2020-02-28 MED ORDER — MENTHOL 3 MG MT LOZG: 1.0000 | LOZENGE | OROMUCOSAL | Status: DC | PRN

## 2020-02-28 MED ORDER — BACITRACIN ZINC 500 UNIT/GM EX OINT
TOPICAL_OINTMENT | Freq: Two times a day (BID) | CUTANEOUS | Status: DC
  Administered 2020-02-28: 15.7778 via TOPICAL
  Filled 2020-02-28: qty 28.4

## 2020-02-28 MED ORDER — ZOLPIDEM TARTRATE 5 MG PO TABS: 10.0000 mg | ORAL_TABLET | Freq: Two times a day (BID) | ORAL | Status: DC | PRN

## 2020-02-28 MED ORDER — ACETAMINOPHEN 10 MG/ML IV SOLN
INTRAVENOUS | Status: DC | PRN
  Administered 2020-02-28: 1000 mg via INTRAVENOUS

## 2020-02-28 MED ORDER — ACETAMINOPHEN 650 MG RE SUPP: 650.0000 mg | RECTAL | Status: DC | PRN

## 2020-02-28 MED ORDER — SODIUM CHLORIDE 0.9% FLUSH: 3.0000 mL | INTRAVENOUS | Status: DC | PRN

## 2020-02-28 SURGICAL SUPPLY — 48 items
BAG DECANTER FOR FLEXI CONT (MISCELLANEOUS) ×3 IMPLANT
BAND RUBBER #18 3X1/16 STRL (MISCELLANEOUS) IMPLANT
BENZOIN TINCTURE PRP APPL 2/3 (GAUZE/BANDAGES/DRESSINGS) ×3 IMPLANT
BUR MATCHSTICK NEURO 3.0 LAGG (BURR) ×3 IMPLANT
CANISTER SUCT 3000ML PPV (MISCELLANEOUS) ×3 IMPLANT
CARTRIDGE OIL MAESTRO DRILL (MISCELLANEOUS) ×1 IMPLANT
CLOSURE WOUND 1/2 X4 (GAUZE/BANDAGES/DRESSINGS) ×1
DERMABOND ADVANCED (GAUZE/BANDAGES/DRESSINGS) ×2
DERMABOND ADVANCED .7 DNX12 (GAUZE/BANDAGES/DRESSINGS) ×1 IMPLANT
DIFFUSER DRILL AIR PNEUMATIC (MISCELLANEOUS) ×3 IMPLANT
DRAPE LAPAROTOMY 100X72 PEDS (DRAPES) ×3 IMPLANT
DRAPE MICROSCOPE LEICA (MISCELLANEOUS) ×3 IMPLANT
DRSG OPSITE POSTOP 3X4 (GAUZE/BANDAGES/DRESSINGS) ×3 IMPLANT
DURAPREP 26ML APPLICATOR (WOUND CARE) ×3 IMPLANT
ELECT REM PT RETURN 9FT ADLT (ELECTROSURGICAL) ×3
ELECTRODE REM PT RTRN 9FT ADLT (ELECTROSURGICAL) ×1 IMPLANT
GAUZE 4X4 16PLY RFD (DISPOSABLE) IMPLANT
GAUZE SPONGE 4X4 12PLY STRL (GAUZE/BANDAGES/DRESSINGS) ×3 IMPLANT
GLOVE BIOGEL PI IND STRL 7.0 (GLOVE) ×2 IMPLANT
GLOVE BIOGEL PI IND STRL 7.5 (GLOVE) ×1 IMPLANT
GLOVE BIOGEL PI INDICATOR 7.0 (GLOVE) ×4
GLOVE BIOGEL PI INDICATOR 7.5 (GLOVE) ×2
GLOVE SKINSENSE 9.0 STRL ORNG (GLOVE) ×9 IMPLANT
GLOVE SS N UNI LF 7.0 STRL (GLOVE) ×9 IMPLANT
GOWN STRL REUS W/ TWL LRG LVL3 (GOWN DISPOSABLE) ×2 IMPLANT
GOWN STRL REUS W/ TWL XL LVL3 (GOWN DISPOSABLE) ×1 IMPLANT
GOWN STRL REUS W/TWL 2XL LVL3 (GOWN DISPOSABLE) IMPLANT
GOWN STRL REUS W/TWL LRG LVL3 (GOWN DISPOSABLE) ×6
GOWN STRL REUS W/TWL XL LVL3 (GOWN DISPOSABLE) ×3
HEMOSTAT POWDER KIT SURGIFOAM (HEMOSTASIS) ×3 IMPLANT
KIT BASIN OR (CUSTOM PROCEDURE TRAY) ×3 IMPLANT
KIT TURNOVER KIT B (KITS) ×3 IMPLANT
NEEDLE HYPO 22GX1.5 SAFETY (NEEDLE) ×3 IMPLANT
NEEDLE SPNL 22GX3.5 QUINCKE BK (NEEDLE) ×3 IMPLANT
NS IRRIG 1000ML POUR BTL (IV SOLUTION) ×3 IMPLANT
OIL CARTRIDGE MAESTRO DRILL (MISCELLANEOUS) ×3
PACK LAMINECTOMY NEURO (CUSTOM PROCEDURE TRAY) ×3 IMPLANT
PIN MAYFIELD SKULL DISP (PIN) ×3 IMPLANT
SPONGE LAP 4X18 RFD (DISPOSABLE) IMPLANT
SPONGE SURGIFOAM ABS GEL SZ50 (HEMOSTASIS) ×3 IMPLANT
STRIP CLOSURE SKIN 1/2X4 (GAUZE/BANDAGES/DRESSINGS) ×2 IMPLANT
SUT VIC AB 0 CT1 18XCR BRD8 (SUTURE) ×1 IMPLANT
SUT VIC AB 0 CT1 8-18 (SUTURE) ×3
SUT VIC AB 2-0 CT1 18 (SUTURE) ×3 IMPLANT
SUT VIC AB 3-0 SH 8-18 (SUTURE) ×3 IMPLANT
TOWEL GREEN STERILE (TOWEL DISPOSABLE) ×3 IMPLANT
TOWEL GREEN STERILE FF (TOWEL DISPOSABLE) ×3 IMPLANT
WATER STERILE IRR 1000ML POUR (IV SOLUTION) ×3 IMPLANT

## 2020-02-28 NOTE — Brief Op Note (Signed)
02/28/2020  9:42 AM  PATIENT:  Melissa Allen  54 y.o. female  PRE-OPERATIVE DIAGNOSIS:  Stenosis  POST-OPERATIVE DIAGNOSIS:  Stenosis  PROCEDURE:  Procedure(s) with comments: Right Cervical Six-Seven Laminectomy and Foraminotomy (Right) - 3C  SURGEON:  Surgeon(s) and Role:    * Earnie Larsson, MD - Primary  PHYSICIAN ASSISTANT:   ASSISTANTS: Bergman<NP   ANESTHESIA:   general  EBL:  100 mL   BLOOD ADMINISTERED:none  DRAINS: none   LOCAL MEDICATIONS USED:  MARCAINE     SPECIMEN:  No Specimen  DISPOSITION OF SPECIMEN:  N/A  COUNTS:  YES  TOURNIQUET:  * No tourniquets in log *  DICTATION: .Dragon Dictation  PLAN OF CARE: Admit for overnight observation  PATIENT DISPOSITION:  PACU - hemodynamically stable.   Delay start of Pharmacological VTE agent (>24hrs) due to surgical blood loss or risk of bleeding: yes

## 2020-02-28 NOTE — H&P (Signed)
Melissa Allen is an 54 y.o. female.   Chief Complaint: Right arm pain HPI: 54 year old female status post prior anterior cervical decompression and fusion presents with persistent right upper extremity pain after a fall.  Symptoms consistent with a right-sided C7 radiculopathy.  Work-up demonstrates evidence of right-sided C6-7 foraminal stenosis.  Patient has failed conservative management.  She presents now for right-sided C67 laminotomy and foraminotomies in hopes of improving her symptoms.  Past Medical History:  Diagnosis Date  . Allergy   . Anginal pain ([place])    one episode 4-5 years ago-[facility]-nonspecific-no problems since-  . Anxiety   . Arthritis    multiple areas of arthritis- DDD  . Asthma    recent admit to ER 01/11/12 for exac of asthma  . Calcaneal fracture   . Duplicated ureter, right   . Fibromyalgia   . History of kidney stones   . Hypertension   . Obesity (BMI 30.0-34.9)   . Pancreatitis 1986  . PONV (postoperative nausea and vomiting)   . Poor venous access    hard to start iv-  . 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 Shortness of breath dyspnea   . Wears glasses     Past Surgical History:  Procedure Laterality Date  . ABDOMINAL HYSTERECTOMY    . ANTERIOR LAT LUMBAR FUSION Left 08/17/2018   Procedure: LUMBAR THREE-FOUR ANTERIOR LATERAL INTERBODY FUSION;  Surgeon: Earnie Larsson, MD;  Location: [facility];  Service: Neurosurgery;  Laterality: Left;  . APPENDECTOMY  08/05/2017  . BLADDER SUSPENSION  03/01/2012   Procedure: TRANSVAGINAL TAPE (TVT) PROCEDURE;  Surgeon: Emily Filbert, MD;  Location: [facility];  Service: Gynecology;  Laterality: N/A;  . BUNIONECTOMY  1995   Right  . CHOLECYSTECTOMY  late 1980's      . CYSTOSCOPY  03/01/2012   Procedure: CYSTOSCOPY;  Surgeon: Emily Filbert, MD;  Location: [facility];  Service: Gynecology;  Laterality:  N/A;  . CYSTOSCOPY W/ URETERAL STENT PLACEMENT Left 12/15/2014   Procedure: CYSTOSCOPY, LEFT URETEROSCOPYU WITH STONE EXTRACTION;  Surgeon: Irine Seal, MD;  Location: [facility];  Service: Urology;  Laterality: Left;  . CYSTOSCOPY W/ URETERAL STENT PLACEMENT Left 10/02/2016   Procedure: CYSTOSCOPY WITH RETROGRADE PYELOGRAM/URETERAL STENT PLACEMENT ureteroscopy, basket extraction;  Surgeon: Cleon Gustin, MD;  Location: [facility];  Service: Urology;  Laterality: Left;  . HARDWARE REMOVAL N/A 12/22/2016   Procedure: Lumbar Four-Five Removal of Hardware;  Surgeon: Earnie Larsson, MD;  Location: [facility];  Service: Neurosurgery;  Laterality: N/A;  . INCISION AND DRAINAGE ABSCESS Left 07/19/2013   Procedure: INCISION AND DRAINAGE LEFT LOWER EXTERMITY HEMATOMA;  Surgeon: Imogene Burn. Georgette Dover, MD;  Location: [facility];  Service: General;  Laterality: Left;  Excision left lower leg mass  . KNEE CARTILAGE SURGERY     left  . KNEE SURGERY Left    x 2, Murphy/Sue, corrected Patella displacement   . LAMINECTOMY    . LAPAROSCOPIC APPENDECTOMY N/A 08/05/2017   Procedure: APPENDECTOMY LAPAROSCOPIC;  Surgeon: Kinsinger, Arta Bruce, MD;  Location: [facility];  Service: General;  Laterality: N/A;  . LUMBAR LAMINECTOMY  10/06   L4-5, Ray  . LUMBAR PERCUTANEOUS PEDICLE SCREW 1 LEVEL Left 08/17/2018   Procedure: LUMBAR THREE-FOUR LUMBAR PERCUTANEOUS PEDICLE SCREW;  Surgeon: Earnie Larsson, MD;  Location: [facility];  Service: Neurosurgery;  Laterality: Left;  .  R compressed pronator Right    Sypher, R arm  . RESECTION DISTAL CLAVICAL Right 06/30/2017   Procedure: RIGHT RESECTION DISTAL CLAVICAL;  Surgeon: Melrose Nakayama, MD;  Location: [facility];  Service: Orthopedics;  Laterality: Right;  . SHOULDER ACROMIOPLASTY Right 06/30/2017   Procedure: RIGHT SHOULDER ACROMIOPLASTY;  Surgeon: Melrose Nakayama, MD;  Location: [facility];  Service: Orthopedics;  Laterality: Right;  . SHOULDER ARTHROSCOPY Right 06/30/2017   Procedure:  ARTHROSCOPY DEBRIDEMENT RIGHT SHOULDER;  Surgeon: Melrose Nakayama, MD;  Location: [facility];  Service: Orthopedics;  Laterality: Right;  . SPINAL FUSION    . TENDON REPAIR  2013  . TENDON REPAIR Right 02/01/12  . TONSILLECTOMY  age 1  . TONSILLECTOMY      Family History  Problem Relation Age of Onset  . Cancer Mother        pancreatic cancer  . Heart disease Father 55  . Colon polyps Father   . Colon cancer Paternal Grandfather   . Colon cancer Maternal Grandmother   . Uterine cancer Other        Grandmother  . Breast cancer Other        Grandmother  . Ovarian cancer Other        Grandmother  . Diabetes Other        mother  . Other Neg Hx    Social History:  reports that she has never smoked. She has never used smokeless tobacco. She reports previous alcohol use. She reports that she does not use drugs.  Allergies:  Allergies  Allergen Reactions  . Chlorquinaldol Rash    11/30/19 patch testing: positive reaction to Quinoline Mix (contains clioquinol and chlorquinaldol)  . Clioquinol Rash    11/30/19 patch testing: positive reaction to Quinoline Mix (contains clioquinol and chlorquinaldol)   . Other Anaphylaxis    Pt has anaphylactic reaction to something during procedure - unsure of what   Per patient, Quinoline Mix  - based on allergy testing  . Latex Itching, Swelling and Dermatitis    SWELLING FROM FACE MASK RING AFTER LAST SURGERY   . Lisinopril Cough       . Codeine Nausea And Vomiting  . Hydrocodone Itching  . Tape Rash    TOLERATES PAPER TAPE ONLY NO PINK SURGICAL TAPE EITHER, EKG Leads     Medications Prior to Admission  Medication Sig Dispense Refill  . cyclobenzaprine (FLEXERIL) 5 MG tablet Take 5 mg by mouth 3 (three) times daily as needed for muscle spasms.    Marland Kitchen doxepin (SINEQUAN) 10 MG capsule Take 10 mg by mouth at bedtime.    . DULoxetine (CYMBALTA) 60 MG capsule Take 90 mg by mouth daily.     Marland Kitchen EPINEPHrine 0.3 mg/0.3 mL IJ SOAJ injection Inject 0.3  mLs (0.3 mg total) into the muscle as needed for anaphylaxis. 1 each 1  . estrogens, conjugated, (PREMARIN) 0.3 MG tablet TAKE 1 TABLET BY MOUTH DAILY (TAKE FOR 21 DAYS THEN DO NOT TAKE FOR 7DAYS) 90 tablet 0  . fluticasone (FLONASE) 50 MCG/ACT nasal spray USE 2 SPRAYS INTO EACH NOSTRIL ONCE DAILY AS DIRECTED (Patient taking differently: Place 2 sprays into both nostrils daily. ) 16 g 11  . ibuprofen (ADVIL,MOTRIN) 200 MG tablet You can safely take 3 tablets every 6 hours as needed for pain.  He can alternate this with plain Tylenol.  You can also alternate with the prescribed narcotic.  I would recommend to use the Tylenol and ibuprofen as your first line pain medications. (  Patient taking differently: Take 600 mg by mouth every 6 (six) hours as needed (pain). You can safely take 3 tablets every 6 hours as needed for pain.  He can alternate this with plain Tylenol.  You can also alternate with the prescribed narcotic.  I would recommend to use the Tylenol and ibuprofen as your first line pain medications.)    . losartan (COZAAR) 50 MG tablet Take 1 tablet (50 mg total) by mouth daily. 90 tablet 0  . Olopatadine HCl (PATADAY) 0.2 % SOLN Place 1 drop into both eyes 2 (two) times daily as needed. 7.5 mL 1  . oxymorphone (OPANA) 5 MG tablet Take 5 mg by mouth every 4 (four) hours.    . pantoprazole (PROTONIX) 40 MG tablet Take 1 tablet (40 mg total) by mouth 2 (two) times daily before a meal. 60 tablet 3  . triamterene-hydrochlorothiazide (DYAZIDE) 37.5-25 MG capsule Take 1 capsule by mouth daily.    Marland Kitchen albuterol (PROAIR HFA) 108 (90 Base) MCG/ACT inhaler INHALE TWO PUFFS EVERY FOUR HOURS AS NEEDED SHORTNESS OF BREATH 8.5 g 2  . cetirizine (ZYRTEC) 10 MG tablet Take 1 tablet (10 mg total) by mouth daily. (Patient not taking: Reported on 12/27/2019) 90 tablet 1  . magic mouthwash w/lidocaine SOLN Take 10 mLs by mouth 3 (three) times daily as needed for mouth pain (swish and spit 3 times a day as needed for  tongue pain). (Patient not taking: Reported on 12/27/2019) 150 mL 0  . zolpidem (AMBIEN) 10 MG tablet TAKE 1 TABLET BY MOUTH AT BEDTIME (Patient taking differently: Take 10 mg by mouth at bedtime. ) 30 tablet 5    No results found for this or any previous visit (from the past 48 hour(s)). No results found.  Pertinent items noted in HPI and remainder of comprehensive ROS otherwise negative.  Blood pressure 124/67, pulse (!) 114, temperature 98.3 F (36.8 C), resp. rate 20, height 5\' 3"  (1.6 m), weight 85.2 kg, last menstrual period 12/21/2011, SpO2 98 %.  Patient is awake and alert.  She is oriented and appropriate.  Speech is fluent.  Judgment insight are intact.  Cranial nerve function normal bilateral for motor examination with normal motor strength bilateral.  Sensory examination with decrease sensation pinprick and light touch in her right C7 dermatome.  Deep tender versus normal active.  No evidence of long track signs.  Gait and posture normal.  Examination head ears eyes nose throat is unremarkable chest and abdomen are benign.  Extremities are free from injury deformity. 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  Plan right C6-7 foraminal decompression.  Risks and benefits been explained.  Patient wishes to proceed.  Cooper Render Delquan Poucher 02/28/2020, 7:51 AM

## 2020-02-28 NOTE — Op Note (Signed)
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  Asst. Surgeon: Reinaldo Meeker, NP  Anesthesia: General  Indication: 54 year old female status post prior C5-6 and C6-7 anterior cervical discectomy and fusion.  Patient had been doing reasonably well until a fall.  She has had persistent right-sided C7 radicular symptoms since that time failing conservative management her work-up demonstrates evidence of residual foraminal stenosis on the right at [facility].  Patient presents now for decompressive surgery in hopes of improving her symptoms.  Fusion appears solid at this level and at C5-6 as well.  Operative note: After induction of anesthesia, patient position prone onto bolsters with her head fixed in a neutral head position in the [place] pin headrest.  Patient's posterior cervical region prepped and draped sterilely.  Incision made overlying C6-7.  Dissection performed in the right.  Retractor placed.  X-rays taken and the C6-7 level was eventually confirmed.  The last x-ray taken was at the C5-6 level for final confirmation.  Laminotomies then performed at C6-7 using high-speed drill and Kerrison rongeurs to remove the inferior aspect lamina of C6 medial aspect of the C6-7 facet joint and the superior rim of the C7 lamina.  Ligament flavum elevated and resected.  Underlying thecal sac and proximal right-sided C7 nerve root was identified.  The C7 nerve root was then decompressed as it traveled through its foramen.  There was no evidence of any residual compression.  There is no evidence of injury to thecal sac or nerve roots.  Wound is then irrigated with antibiotic solution.  Hemostasis was excellent.  Wounds and closed in layers of Vicryl sutures.  Steri-Strips and sterile dressing were  applied.  No apparent complications.  Patient tolerated the procedure well and she returns to the recovery room postop.

## 2020-02-28 NOTE — Transfer of Care (Signed)
Immediate Anesthesia Transfer of Care Note  Patient: Melissa Allen  Procedure(s) Performed: Right Cervical Six-Seven Laminectomy and Foraminotomy (Right Spine Cervical)  Patient Location: PACU  Anesthesia Type:General  Level of Consciousness: awake, alert  and oriented  Airway & Oxygen Therapy: Patient connected to face mask oxygen  Post-op Assessment: Post -op Vital signs reviewed and stable  Post vital signs: stable  Last Vitals:  Vitals Value Taken Time  BP 132/65 02/28/20 0951  Temp 36.3 C 02/28/20 0950  Pulse 95 02/28/20 0951  Resp 17 02/28/20 0951  SpO2 98 % 02/28/20 0951  Vitals shown include unvalidated device data.  Last Pain:  Vitals:   02/28/20 0950  PainSc: (P) Asleep      Patients Stated Pain Goal: 2 (58/59/29 2446)  Complications: No complications documented.

## 2020-02-28 NOTE — Anesthesia Procedure Notes (Signed)
Procedure Name: Intubation Date/Time: 02/28/2020 8:10 AM Performed by: Lavell Luster, CRNA Pre-anesthesia Checklist: Patient identified, Emergency Drugs available, Suction available, Patient being monitored and Timeout performed Patient Re-evaluated:Patient Re-evaluated prior to induction Oxygen Delivery Method: Circle system utilized Preoxygenation: Pre-oxygenation with 100% oxygen Induction Type: IV induction Ventilation: Mask ventilation without difficulty Laryngoscope Size: Mac, 3 and Glidescope Grade View: Grade I Tube type: Oral Tube size: 7.0 mm Number of attempts: 1 Airway Equipment and Method: Stylet Placement Confirmation: ETT inserted through vocal cords under direct vision,  positive ETCO2 and breath sounds checked- equal and bilateral Secured at: 21 cm Tube secured with: Tape Dental Injury: Teeth and Oropharynx as per pre-operative assessment  Difficulty Due To: Difficulty was anticipated, Difficult Airway- due to anterior larynx, Difficult Airway- due to limited oral opening, Difficult Airway-  due to neck instability and Difficult Airway- due to reduced neck mobility Future Recommendations: Recommend- induction with short-acting agent, and alternative techniques readily available Comments: Videoscope was planned due to pt neck instability.  Henderson Cloud, CRNA

## 2020-02-29 ENCOUNTER — Encounter (HOSPITAL_COMMUNITY): Payer: Self-pay | Admitting: Neurosurgery

## 2020-02-29 DIAGNOSIS — M4802 Spinal stenosis, cervical region: Secondary | ICD-10-CM | POA: Diagnosis not present

## 2020-02-29 NOTE — Discharge Summary (Signed)
Physician Discharge Summary  Patient ID: Melissa Allen MRN: [id number] DOB/AGE: Mar 01, 1966 54 y.o.  Admit date: 02/28/2020 Discharge date: 02/29/2020  Admission Diagnoses:  Discharge Diagnoses:  Active Problems:   Cervical radiculopathy   Discharged Condition: good  Hospital Course: Patient admitted to the hospital where she underwent uncomplicated posterior cervical decompression.  Postoperatively doing well.  Preoperative right upper extremity pain and weakness much improved.  Having appropriate neck pain.  Ambulating and voiding without difficulty.  Ready for discharge home.Consults:   Significant Diagnostic Studies:   Treatments:   Discharge Exam: Blood pressure 133/78, pulse (!) 107, temperature 98.3 F (36.8 C), temperature source Oral, resp. rate 16, height 5\' 3"  (1.6 m), weight 85.2 kg, last menstrual period 12/21/2011, SpO2 99 %. Patient is awake and alert.  She is oriented and appropriate.  Speech is fluent.  Judgment insight are intact.  Cranial nerve function normal bilateral.  Motor examination intact bilaterally sensory examination nonfocal.  Wound clean and dry.  Chest and abdomen benign.  Disposition: Discharge disposition: 01-Home or Self Care        Allergies as of 02/29/2020      Reactions   Chlorquinaldol Rash   11/30/19 patch testing: positive reaction to Quinoline Mix (contains clioquinol and chlorquinaldol)   Clioquinol Rash   11/30/19 patch testing: positive reaction to Quinoline Mix (contains clioquinol and chlorquinaldol)   Other Anaphylaxis   Pt has anaphylactic reaction to something during procedure - unsure of what  Per patient, Quinoline Mix  - based on allergy testing   Latex Itching, Swelling, Dermatitis   SWELLING FROM FACE MASK RING AFTER LAST SURGERY   Lisinopril Cough      Codeine Nausea And Vomiting   Hydrocodone Itching   Tape Rash   TOLERATES PAPER TAPE ONLY NO PINK SURGICAL TAPE EITHER, EKG Leads       Medication List    TAKE  these medications   albuterol 108 (90 Base) MCG/ACT inhaler Commonly known as: ProAir HFA INHALE TWO PUFFS EVERY FOUR HOURS AS NEEDED SHORTNESS OF BREATH   cyclobenzaprine 5 MG tablet Commonly known as: FLEXERIL Take 5 mg by mouth 3 (three) times daily as needed for muscle spasms.   doxepin 10 MG capsule Commonly known as: SINEQUAN Take 10 mg by mouth at bedtime.   DULoxetine 60 MG capsule Commonly known as: CYMBALTA Take 90 mg by mouth daily.   EPINEPHrine 0.3 mg/0.3 mL Soaj injection Commonly known as: EPI-PEN Inject 0.3 mLs (0.3 mg total) into the muscle as needed for anaphylaxis.   estradiol 1 MG tablet Commonly known as: ESTRACE TAKE 1 TABLET BY MOUTH DAILY FOR 21 DAYS, THEN DO NOT TAKE FOR 7 DAYS.   estrogens (conjugated) 0.3 MG tablet Commonly known as: Premarin TAKE 1 TABLET BY MOUTH DAILY (TAKE FOR 21 DAYS THEN DO NOT TAKE FOR 7DAYS)   fluticasone 50 MCG/ACT nasal spray Commonly known as: FLONASE USE 2 SPRAYS INTO EACH NOSTRIL ONCE DAILY AS DIRECTED What changed:   how much to take  how to take this  when to take this  additional instructions   ibuprofen 200 MG tablet Commonly known as: ADVIL You can safely take 3 tablets every 6 hours as needed for pain.  He can alternate this with plain Tylenol.  You can also alternate with the prescribed narcotic.  I would recommend to use the Tylenol and ibuprofen as your first line pain medications. What changed:   how much to take  how to take this  when  to take this  reasons to take this   losartan 50 MG tablet Commonly known as: COZAAR Take 1 tablet (50 mg total) by mouth daily.   Olopatadine HCl 0.2 % Soln Commonly known as: Pataday Place 1 drop into both eyes 2 (two) times daily as needed.   oxymorphone 5 MG tablet Commonly known as: OPANA Take 5 mg by mouth every 4 (four) hours.   pantoprazole 40 MG tablet Commonly known as: PROTONIX Take 1 tablet (40 mg total) by mouth 2 (two) times daily  before a meal.   triamterene-hydrochlorothiazide 37.5-25 MG capsule Commonly known as: DYAZIDE Take 1 capsule by mouth daily.   zolpidem 10 MG tablet Commonly known as: AMBIEN TAKE 1 TABLET BY MOUTH AT BEDTIME        Signed: Charlie Pitter 02/29/2020, 8:35 AM

## 2020-02-29 NOTE — Plan of Care (Signed)
Patient alert and oriented, mae's well, voiding adequate amount of urine, swallowing without difficulty, c/o mild pain at time of discharge and ice pack placed. Patient discharged home with family. Script and discharged instructions given to patient. Patient and family stated understanding of instructions given. Patient has an appointment with Dr. Annette Stable

## 2020-02-29 NOTE — Anesthesia Postprocedure Evaluation (Signed)
Anesthesia Post Note  Patient: Melissa Allen  Procedure(s) Performed: Right Cervical Six-Seven Laminectomy and Foraminotomy (Right Spine Cervical)     Patient location during evaluation: PACU Anesthesia Type: General Level of consciousness: awake and alert Pain management: pain level controlled Vital Signs Assessment: post-procedure vital signs reviewed and stable Respiratory status: spontaneous breathing, nonlabored ventilation, respiratory function stable and patient connected to nasal cannula oxygen Cardiovascular status: blood pressure returned to baseline and stable Postop Assessment: no apparent nausea or vomiting Anesthetic complications: no   No complications documented.  Last Vitals:  Vitals:   02/29/20 0342 02/29/20 0749  BP: 126/73 133/78  Pulse: 91 (!) 107  Resp: 18 16  Temp: 36.8 C 36.8 C  SpO2: 100% 99%    Last Pain:  Vitals:   02/29/20 0752  TempSrc:   PainSc: 3                  [name]

## 2020-02-29 NOTE — Evaluation (Signed)
Occupational Therapy Evaluation Patient Details Name: Melissa Allen MRN: [id number] DOB: 06-Sep-1966 Today's Date: 02/29/2020    History of Present Illness 54 year old female status post prior C5-6 and C6-7 anterior cervical discectomy and fusion.  Patient had been doing reasonably well until a fall.  She has had persistent right-sided C7 radicular symptoms since that time failing conservative management. Patient presents now for Right C6-7 laminotomy and foraminotomies.    Clinical Impression   Educated patient regarding cervical precautions and maintaining during self care/daily routine. Patient is familiar due to history of multiple back/cervical surgeries. Also educate patient on fall prevention strategies due to pt report history multiple falls with most recent ~02/18/20. All education completed, no further acute OT needs at this time.     Follow Up Recommendations  Follow surgeon's recommendation for DC plan and follow-up therapies    Equipment Recommendations  None recommended by OT       Precautions / Restrictions Precautions Precautions: Cervical Precaution Booklet Issued: Yes (comment) Precaution Comments: no cervical brace Restrictions Weight Bearing Restrictions: No      Mobility Bed Mobility Overal bed mobility: Modified Independent                Transfers Overall transfer level: Modified independent                    Balance Overall balance assessment: Mild deficits observed, not formally tested                                         ADL either performed or assessed with clinical judgement   ADL Overall ADL's : Modified independent                                       General ADL Comments: patient very familiar with precautions as she has had multiple back and cervical surgeries. able to verbalize percautions and follow during mobility, self care.                   Pertinent Vitals/Pain Pain  Assessment: Faces Faces Pain Scale: Hurts a little bit Pain Location: neck Pain Descriptors / Indicators: Sore (stiff) Pain Intervention(s): Monitored during session     Hand Dominance Right   Extremity/Trunk Assessment Upper Extremity Assessment Upper Extremity Assessment: RUE deficits/detail RUE Deficits / Details: patient reports improvement in numbness post surgery however reports she also has carpal tunnel, pt with B weak grip strength 3-/5   Lower Extremity Assessment Lower Extremity Assessment: Overall WFL for tasks assessed   Cervical / Trunk Assessment Cervical / Trunk Assessment: Normal   Communication Communication Communication: No difficulties   Cognition Arousal/Alertness: Awake/alert Behavior During Therapy: WFL for tasks assessed/performed Overall Cognitive Status: Within Functional Limits for tasks assessed                                 General Comments: tangential   General Comments  educate patient on fall prevention strategies as patient states she falls a lot, most recent on 6/12, then again in April. Discuss wearing proper footwear, limiting involvement in IADLs especially with cervical precautions. Pt verbalize understanding             Home Living Family/patient expects to be  discharged to:: Private residence Living Arrangements: Spouse/significant other Available Help at Discharge: Family Type of Home: [place]: Two level;Able to live on main level with bedroom/bathroom     Bathroom Shower/Tub: Occupational psychologist: Handicapped height     Home Equipment: Shower seat - built in;Grab bars - tub/shower          Prior Functioning/Environment Level of Independence: Independent        Comments: with basic ADLs, has not been doing IADLs. Does like to go to pool and tai chi        OT Problem List: Decreased strength;Impaired balance (sitting and/or standing);Pain;Impaired UE functional use          OT Goals(Current goals can be found in the care plan section) Acute Rehab OT Goals Patient Stated Goal: regain strength OT Goal Formulation: With patient   AM-PAC OT "6 Clicks" Daily Activity     Outcome Measure Help from another person eating meals?: None Help from another person taking care of personal grooming?: None Help from another person toileting, which includes using toliet, bedpan, or urinal?: None Help from another person bathing (including washing, rinsing, drying)?: None Help from another person to put on and taking off regular upper body clothing?: None Help from another person to put on and taking off regular lower body clothing?: None 6 Click Score: 24   End of Session Nurse Communication: Mobility status  Activity Tolerance: Patient tolerated treatment well Patient left: in bed;with call bell/phone within reach  OT Visit Diagnosis: Pain Pain - Right/Left: Right Pain - part of body: Arm (neck)                Time: 0710-0727 OT Time Calculation (min): 17 min Charges:  OT General Charges $OT Visit: 1 Visit OT Evaluation $OT Eval Moderate Complexity: 1 Mod  Delbert Phenix OT OT office: [place] 02/29/2020, 8:35 AM

## 2020-02-29 NOTE — Discharge Instructions (Addendum)
Wound Care Keep incision covered and dry for two days.   Do not put any creams, lotions, or ointments on incision. Leave steri-strips on back of neck.  They will fall off by themselves. Activity Walk each and every day, increasing distance each day. No lifting greater than 5 lbs.  Avoid excessive neck motion. No driving for 2 weeks; may ride as a passenger locally.  Diet Resume your normal diet.  Return to Work Will be discussed at you follow up appointment. Call Your Doctor If Any of These Occur Redness, drainage, or swelling at the wound.  Temperature greater than 101 degrees. Severe pain not relieved by pain medication. Incision starts to come apart. Follow Up Appt Call today for appointment in 1-2 weeks ([phone]) or for problems.  If you have any hardware placed in your spine, you will need an x-ray before your appointment.

## 2020-03-06 NOTE — Telephone Encounter (Signed)
Last OV 12/27/19 With Dr. Ethelene Hal Med was discontinued, per Dr. Loletha Grayer 12/16/19

## 2020-03-19 ENCOUNTER — Telehealth: Payer: Self-pay | Admitting: Family Medicine

## 2020-03-19 ENCOUNTER — Other Ambulatory Visit: Payer: Self-pay

## 2020-03-19 MED ORDER — LOSARTAN POTASSIUM 50 MG PO TABS: 50.0000 mg | ORAL_TABLET | Freq: Every day | ORAL | 0 refills | Status: DC

## 2020-03-19 MED ORDER — TRIAMTERENE-HCTZ 37.5-25 MG PO CAPS: 1.0000 | ORAL_CAPSULE | Freq: Every day | ORAL | 1 refills | Status: DC

## 2020-03-19 NOTE — Telephone Encounter (Signed)
Rx sent in patient aware and will pick up

## 2020-03-19 NOTE — Telephone Encounter (Signed)
Pt calling and saying that she needs a refill on losartan (COZAAR) 50 MG tablet and HCTZ. She said she talked to her pharmacy and that they said they have sent over faxes but havent heard anything back. Pt has been out for a few days and needs them filled asap, please advise

## 2020-03-20 ENCOUNTER — Other Ambulatory Visit: Payer: Self-pay | Admitting: Allergy & Immunology

## 2020-03-20 ENCOUNTER — Other Ambulatory Visit: Payer: Self-pay | Admitting: Family Medicine

## 2020-03-20 NOTE — Telephone Encounter (Signed)
Patient would like a refill on Fluticasone and Olopatadine.  Please Advise.

## 2020-03-29 MED ORDER — TRIAMTERENE-HCTZ 37.5-25 MG PO CAPS: 1.0000 | ORAL_CAPSULE | Freq: Every day | ORAL | 1 refills | Status: DC

## 2020-03-29 MED ORDER — LOSARTAN POTASSIUM 50 MG PO TABS: 50.0000 mg | ORAL_TABLET | Freq: Every day | ORAL | 1 refills | Status: DC

## 2020-03-29 MED ORDER — PANTOPRAZOLE SODIUM 40 MG PO TBEC: 40.0000 mg | DELAYED_RELEASE_TABLET | Freq: Two times a day (BID) | ORAL | 1 refills | Status: DC

## 2020-03-29 NOTE — Telephone Encounter (Signed)
Please advise 

## 2020-03-29 NOTE — Addendum Note (Signed)
Addended by: Aviva Signs M on: 03/29/2020 12:52 PM   Modules accepted: Orders

## 2020-04-20 ENCOUNTER — Encounter: Payer: Self-pay | Admitting: Nurse Practitioner

## 2020-04-20 ENCOUNTER — Other Ambulatory Visit: Payer: Self-pay

## 2020-04-20 ENCOUNTER — Ambulatory Visit (INDEPENDENT_AMBULATORY_CARE_PROVIDER_SITE_OTHER): Payer: BC Managed Care – PPO | Admitting: Nurse Practitioner

## 2020-04-20 VITALS — BP 112/82 | HR 108 | Ht 63.0 in | Wt 189.2 lb

## 2020-04-20 DIAGNOSIS — L247 Irritant contact dermatitis due to plants, except food: Secondary | ICD-10-CM | POA: Diagnosis not present

## 2020-04-20 MED ORDER — METHYLPREDNISOLONE ACETATE 40 MG/ML IJ SUSP: 40.0000 mg | Freq: Once | INTRAMUSCULAR | Status: DC

## 2020-04-20 MED ORDER — TRIAMCINOLONE ACETONIDE 0.5 % EX CREA: 1.0000 "application " | TOPICAL_CREAM | Freq: Two times a day (BID) | CUTANEOUS | 0 refills | Status: DC

## 2020-04-20 NOTE — Progress Notes (Signed)
Subjective:  Patient ID: Melissa GEHLING, female    DOB: 1966/08/28  Age: 54 y.o. MRN: [id number]  CC: Acute Visit (itchy rash various areas of body)  Rash This is a new problem. The current episode started in the past 7 days. The problem has been gradually worsening since onset. The affected locations include the left upper leg, torso and right upper leg. The rash is characterized by blistering, itchiness and redness. She was exposed to plant contact. Pertinent negatives include no congestion, facial edema, fever or shortness of breath. Past treatments include topical steroids, moisturizer and antihistamine. The treatment provided mild relief.  declined oral prednisone due to side effects in past (increase irritability)  Reviewed past Medical, Social and Family history today.  Outpatient Medications Prior to Visit  Medication Sig Dispense Refill   albuterol (PROAIR HFA) 108 (90 Base) MCG/ACT inhaler INHALE TWO PUFFS EVERY FOUR HOURS AS NEEDED SHORTNESS OF BREATH 8.5 g 2   cyclobenzaprine (FLEXERIL) 5 MG tablet Take 5 mg by mouth 3 (three) times daily as needed for muscle spasms.     doxepin (SINEQUAN) 10 MG capsule Take 20 mg by mouth at bedtime.      DULoxetine (CYMBALTA) 60 MG capsule Take 90 mg by mouth daily.      EPINEPHrine 0.3 mg/0.3 mL IJ SOAJ injection Inject 0.3 mLs (0.3 mg total) into the muscle as needed for anaphylaxis. 1 each 1   erythromycin ophthalmic ointment Place 1 application into both eyes as directed. States BID     estradiol (ESTRACE) 1 MG tablet TAKE 1 TABLET BY MOUTH DAILY FOR 21 DAYS, THEN DO NOT TAKE FOR 7 DAYS. 30 tablet 0   fluticasone (FLONASE) 50 MCG/ACT nasal spray USE 2 SPRAYS INTO EACH NOSTRIL ONCE DAILY AS DIRECTED 16 g 4   ibuprofen (ADVIL,MOTRIN) 200 MG tablet You can safely take 3 tablets every 6 hours as needed for pain.  He can alternate this with plain Tylenol.  You can also alternate with the prescribed narcotic.  I would recommend to use the  Tylenol and ibuprofen as your first line pain medications. (Patient taking differently: Take 600 mg by mouth every 6 (six) hours as needed (pain). You can safely take 3 tablets every 6 hours as needed for pain.  He can alternate this with plain Tylenol.  You can also alternate with the prescribed narcotic.  I would recommend to use the Tylenol and ibuprofen as your first line pain medications.)     losartan (COZAAR) 50 MG tablet Take 1 tablet (50 mg total) by mouth daily. 90 tablet 1   Olopatadine HCl 0.2 % SOLN PLACE ONE DROP INTO BOTH EYES TWO TIMES DAILY AS NEEDED 7.5 mL 1   oxymorphone (OPANA) 5 MG tablet Take 5 mg by mouth every 4 (four) hours.     pantoprazole (PROTONIX) 40 MG tablet Take 1 tablet (40 mg total) by mouth 2 (two) times daily before a meal. 180 tablet 1   RESTASIS 0.05 % ophthalmic emulsion Place 1 drop into both eyes 3 (three) times daily.      triamterene-hydrochlorothiazide (DYAZIDE) 37.5-25 MG capsule Take 1 each (1 capsule total) by mouth daily. 90 capsule 1   zolpidem (AMBIEN) 10 MG tablet TAKE 1 TABLET BY MOUTH AT BEDTIME (Patient taking differently: Take 10 mg by mouth at bedtime. ) 30 tablet 5   estrogens, conjugated, (PREMARIN) 0.3 MG tablet TAKE 1 TABLET BY MOUTH DAILY (TAKE FOR 21 DAYS THEN DO NOT TAKE FOR 7DAYS) 90  tablet 0   No facility-administered medications prior to visit.    ROS See HPI  Objective:  BP 112/82 (BP Location: Left Arm, Patient Position: Standing, Cuff Size: Large)    Pulse (!) 108    Ht 5\' 3"  (1.6 m)    Wt 189 lb 3.2 oz (85.8 kg)    LMP 12/21/2011    SpO2 98%    BMI 33.52 kg/m   Physical Exam  Assessment & Plan:  This visit occurred during the SARS-CoV-2 public health emergency.  Safety protocols were in place, including screening questions prior to the visit, additional usage of staff PPE, and extensive cleaning of exam room while observing appropriate contact time as indicated for disinfecting solutions.   Melissa Allen was seen today  for acute visit.  Diagnoses and all orders for this visit:  Irritant contact dermatitis due to plants, except food -     methylPREDNISolone acetate (DEPO-MEDROL) injection 40 mg -     triamcinolone cream (KENALOG) 0.5 %; Apply 1 application topically 2 (two) times daily.   Problem List Items Addressed This Visit    None    Visit Diagnoses    Irritant contact dermatitis due to plants, except food    -  Primary   Relevant Medications   methylPREDNISolone acetate (DEPO-MEDROL) injection 40 mg (Start on 04/20/2020 10:00 AM)   triamcinolone cream (KENALOG) 0.5 %      Follow-up: No follow-ups on file.  Melissa Lacy, NP

## 2020-04-20 NOTE — Patient Instructions (Signed)
You may need oral prednisone is no improvement in 3days.  Contact Dermatitis Dermatitis is redness, soreness, and swelling (inflammation) of the skin. Contact dermatitis is a reaction to something that touches the skin. There are two types of contact dermatitis:  Irritant contact dermatitis. This happens when something bothers (irritates) your skin, like soap.  Allergic contact dermatitis. This is caused when you are exposed to something that you are allergic to, such as poison ivy. What are the causes?  Common causes of irritant contact dermatitis include: ? Makeup. ? Soaps. ? Detergents. ? Bleaches. ? Acids. ? Metals, such as nickel.  Common causes of allergic contact dermatitis include: ? Plants. ? Chemicals. ? Jewelry. ? Latex. ? Medicines. ? Preservatives in products, such as clothing. What increases the risk?  Having a job that exposes you to things that bother your skin.  Having asthma or eczema. What are the signs or symptoms? Symptoms may happen anywhere the irritant has touched your skin. Symptoms include:  Dry or flaky skin.  Redness.  Cracks.  Itching.  Pain or a burning feeling.  Blisters.  Blood or clear fluid draining from skin cracks. With allergic contact dermatitis, swelling may occur. This may happen in places such as the eyelids, mouth, or genitals. How is this treated?  This condition is treated by checking for the cause of the reaction and protecting your skin. Treatment may also include: ? Steroid creams, ointments, or medicines. ? Antibiotic medicines or other ointments, if you have a skin infection. ? Lotion or medicines to help with itching. ? A bandage (dressing). Follow these instructions at home: Skin care  Moisturize your skin as needed.  Put cool cloths on your skin.  Put a baking soda paste on your skin. Stir water into baking soda until it looks like a paste.  Do not scratch your skin.  Avoid having things rub up  against your skin.  Avoid the use of soaps, perfumes, and dyes. Medicines  Take or apply over-the-counter and prescription medicines only as told by your doctor.  If you were prescribed an antibiotic medicine, take or apply it as told by your doctor. Do not stop using it even if your condition starts to get better. Bathing  Take a bath with: ? Epsom salts. ? Baking soda. ? Colloidal oatmeal.  Bathe less often.  Bathe in warm water. Avoid using hot water. Bandage care  If you were given a bandage, change it as told by your health care provider.  Wash your hands with soap and water before and after you change your bandage. If soap and water are not available, use hand sanitizer. General instructions  Avoid the things that caused your reaction. If you do not know what caused it, keep a journal. Write down: ? What you eat. ? What skin products you use. ? What you drink. ? What you wear in the area that has symptoms. This includes jewelry.  Check the affected areas every day for signs of infection. Check for: ? More redness, swelling, or pain. ? More fluid or blood. ? Warmth. ? Pus or a bad smell.  Keep all follow-up visits as told by your doctor. This is important. Contact a doctor if:  You do not get better with treatment.  Your condition gets worse.  You have signs of infection, such as: ? More swelling. ? Tenderness. ? More redness. ? Soreness. ? Warmth.  You have a fever.  You have new symptoms. Get help right away if:  You have a very bad headache.  You have neck pain.  Your neck is stiff.  You throw up (vomit).  You feel very sleepy.  You see red streaks coming from the area.  Your bone or joint near the area hurts after the skin has healed.  The area turns darker.  You have trouble breathing. Summary  Dermatitis is redness, soreness, and swelling of the skin.  Symptoms may occur where the irritant has touched you.  Treatment may  include medicines and skin care.  If you do not know what caused your reaction, keep a journal.  Contact a doctor if your condition gets worse or you have signs of infection. This information is not intended to replace advice given to you by your health care provider. Make sure you discuss any questions you have with your health care provider. Document Revised: 12/15/2018 Document Reviewed: 03/10/2018 Elsevier Patient Education  [place].

## 2020-04-24 ENCOUNTER — Telehealth: Payer: Self-pay | Admitting: Family Medicine

## 2020-04-24 DIAGNOSIS — L247 Irritant contact dermatitis due to plants, except food: Secondary | ICD-10-CM

## 2020-04-24 NOTE — Telephone Encounter (Signed)
Patient called back and stated that she was seen by Melissa Allen for poison ivy and was told to call back if the rash hasn't gotten any better. Pt stated rash has spreaded and is getting worse, please advise. CB is [phone]

## 2020-04-25 ENCOUNTER — Other Ambulatory Visit: Payer: Self-pay | Admitting: Family Medicine

## 2020-04-25 MED ORDER — METHYLPREDNISOLONE 4 MG PO TBPK: ORAL_TABLET | ORAL | 0 refills | Status: DC

## 2020-04-25 NOTE — Telephone Encounter (Signed)
Rx sent for medrol dose pack to pharm

## 2020-04-25 NOTE — Telephone Encounter (Signed)
Spoke with patient and she states that there are new spots on her legs and she was informed by Baldo Ash Nche-NP that if it got worse she would call in a dose pack, pt states she would like this called in today if possible. Please advise

## 2020-04-25 NOTE — Telephone Encounter (Signed)
Patient notified and verbalized understanding. 

## 2020-05-30 ENCOUNTER — Telehealth: Payer: Self-pay | Admitting: Family Medicine

## 2020-05-30 NOTE — Telephone Encounter (Signed)
Called patient in regards to refill on medication. No answer LMTCB

## 2020-05-30 NOTE — Telephone Encounter (Signed)
[facility] is calling to get a refill on patients Ambien. If approved, please send and notify patient.

## 2020-05-31 ENCOUNTER — Encounter: Payer: Self-pay | Admitting: Family Medicine

## 2020-05-31 ENCOUNTER — Other Ambulatory Visit: Payer: Self-pay

## 2020-05-31 ENCOUNTER — Ambulatory Visit (INDEPENDENT_AMBULATORY_CARE_PROVIDER_SITE_OTHER): Payer: BC Managed Care – PPO | Admitting: Family Medicine

## 2020-05-31 VITALS — BP 158/80 | HR 105 | Wt 190.0 lb

## 2020-05-31 DIAGNOSIS — Z23 Encounter for immunization: Secondary | ICD-10-CM | POA: Diagnosis not present

## 2020-05-31 DIAGNOSIS — E2839 Other primary ovarian failure: Secondary | ICD-10-CM

## 2020-05-31 DIAGNOSIS — Z01419 Encounter for gynecological examination (general) (routine) without abnormal findings: Secondary | ICD-10-CM

## 2020-05-31 MED ORDER — ESTRADIOL 1 MG PO TABS: 1.0000 mg | ORAL_TABLET | Freq: Every day | ORAL | 3 refills | Status: DC

## 2020-05-31 NOTE — Assessment & Plan Note (Signed)
refilled her meds x 1 year.

## 2020-05-31 NOTE — Progress Notes (Signed)
Needs refill on estrogen

## 2020-05-31 NOTE — Progress Notes (Signed)
  Subjective:     Melissa Allen is a 54 y.o. female and is here for a comprehensive physical exam. The patient reports problems - menopause symptoms. Has had TAH with bilateral salpingectomy with Dr. Hulan Fray. On HRT due to symptoms. Out of meds since February. Still with hot flashes and menopausal symptoms. Has had 6 surgeries in 18 months. Has chronic pain and fibromyalgia.     The following portions of the patient's history were reviewed and updated as appropriate: allergies, current medications, past family history, past medical history, past social history, past surgical history and problem list.  Review of Systems Pertinent items noted in HPI and remainder of comprehensive ROS otherwise negative.   Objective:    BP (!) 158/80   Pulse (!) 105   Wt 190 lb (86.2 kg)   LMP 12/21/2011   BMI 33.66 kg/m  General appearance: alert, cooperative and appears stated age Head: Normocephalic, without obvious abnormality, atraumatic Neck: no adenopathy, supple, symmetrical, trachea midline and thyroid not enlarged, symmetric, no tenderness/mass/nodules Lungs: clear to auscultation bilaterally Breasts: normal appearance, no masses or tenderness Heart: regular rate and rhythm, S1, S2 normal, no murmur, click, rub or gallop Abdomen: soft, non-tender; bowel sounds normal; no masses,  no organomegaly Extremities: extremities normal, atraumatic, no cyanosis or edema Pulses: 2+ and symmetric Skin: Skin color, texture, turgor normal. No rashes or lesions Lymph nodes: Cervical, supraclavicular, and axillary nodes normal. Neurologic: Grossly normal    Assessment:    GYN female exam.      Plan:  Does not need pap, due to no cervix. No ovarian/pelvic complaints.  Problem List Items Addressed This Visit      Unprioritized   Estrogen deficiency - Primary    refilled her meds x 1 year.       Relevant Medications   estradiol (ESTRACE) 1 MG tablet    Other Visit Diagnoses    Screening for  malignant neoplasm of cervix       Encounter for gynecological examination without abnormal finding       Need for immunization against influenza       Relevant Orders   Flu Vaccine QUAD 36+ mos IM (Fluarix, Quad PF) (Completed)     Return in 1 year (on 05/31/2021) for schedule mammogram.     See After Visit Summary for Counseling Recommendations

## 2020-06-05 NOTE — Telephone Encounter (Signed)
PCP information has been updated.

## 2020-06-05 NOTE — Telephone Encounter (Signed)
Spoke with patient who states that refill request was a mistake because she no longer sees Dr. Ethelene Hal she has a new PCP.

## 2020-07-23 ENCOUNTER — Other Ambulatory Visit: Payer: Self-pay | Admitting: Ophthalmology

## 2020-07-30 ENCOUNTER — Other Ambulatory Visit: Payer: Self-pay

## 2020-07-30 ENCOUNTER — Telehealth: Payer: Self-pay

## 2020-07-30 DIAGNOSIS — K862 Cyst of pancreas: Secondary | ICD-10-CM

## 2020-07-30 NOTE — Telephone Encounter (Signed)
Pt scheduled for MR of abd at [facility] 08/13/20@10am , pt to arrive there at 9:30am. Pt to be NPO 4 hours prior to scan.

## 2020-07-30 NOTE — Telephone Encounter (Signed)
Spoke with pt and she is aware of appt. 

## 2020-07-30 NOTE — Telephone Encounter (Signed)
-----   Message from Algernon Huxley, RN sent at 07/20/2020  1:11 PM EST ----- Regarding: FW: MRI  ----- Message ----- From: Algernon Huxley, RN Sent: 07/14/2020 To: Algernon Huxley, RN Subject: MRI                                            Pt needs recall MRI of pancreas

## 2020-08-13 ENCOUNTER — Other Ambulatory Visit: Payer: Self-pay

## 2020-08-13 ENCOUNTER — Other Ambulatory Visit: Payer: Self-pay | Admitting: Internal Medicine

## 2020-08-13 ENCOUNTER — Ambulatory Visit (HOSPITAL_COMMUNITY)
Admission: RE | Admit: 2020-08-13 | Discharge: 2020-08-13 | Disposition: A | Discharge status: Home / Self Care | Payer: BC Managed Care – PPO | Source: Ambulatory Visit | Attending: Internal Medicine | Admitting: Internal Medicine

## 2020-08-13 DIAGNOSIS — K862 Cyst of pancreas: Secondary | ICD-10-CM | POA: Insufficient documentation

## 2020-08-13 MED ORDER — GADOBUTROL 1 MMOL/ML IV SOLN
10.0000 mL | Freq: Once | INTRAVENOUS | Status: AC | PRN
  Administered 2020-08-13: 10 mL via INTRAVENOUS

## 2020-08-14 ENCOUNTER — Other Ambulatory Visit: Payer: Self-pay | Admitting: Radiology

## 2020-08-16 ENCOUNTER — Encounter: Payer: Self-pay | Admitting: Radiology

## 2020-08-16 ENCOUNTER — Telehealth: Payer: Self-pay | Admitting: *Deleted

## 2020-08-16 ENCOUNTER — Encounter: Payer: Self-pay | Admitting: *Deleted

## 2020-08-16 DIAGNOSIS — D0512 Intraductal carcinoma in situ of left breast: Secondary | ICD-10-CM | POA: Insufficient documentation

## 2020-08-16 NOTE — Telephone Encounter (Signed)
Left vm for pt to return call to discuss [facility] appt on /. Contact information provided.

## 2020-08-17 ENCOUNTER — Telehealth: Payer: Self-pay | Admitting: *Deleted

## 2020-08-17 NOTE — Telephone Encounter (Signed)
Spoke to pt concerning [place]. Pt has decided to choose her own team. She has an appt to see Dr. Marlou Starks on 12/20 and Dr. Iran Planas on 12/23. Informed pt she does not need to come to [facility]. Received verbal understanding. [facility] notified.

## 2020-08-20 ENCOUNTER — Telehealth: Payer: Self-pay | Admitting: Oncology

## 2020-08-20 NOTE — Telephone Encounter (Signed)
Received referrals for medical oncology and genetics. Melissa Allen has been cld and scheduled to see Dr. Jana Hakim on 12/16 at 4pm w/labs at 330pm and genetics on 12/15 at 11am. Pt aware to arrive 15 minutes early.

## 2020-08-21 ENCOUNTER — Telehealth: Payer: Self-pay | Admitting: *Deleted

## 2020-08-21 NOTE — Telephone Encounter (Signed)
Left vm with appts for genetics and to see Dr. Jana Hakim. Informed does not need to come to [facility] as she chose her own team. Contact information provided for questions or needs

## 2020-08-22 ENCOUNTER — Encounter: Payer: Self-pay | Admitting: Family Medicine

## 2020-08-22 ENCOUNTER — Inpatient Hospital Stay: Payer: BC Managed Care – PPO

## 2020-08-22 ENCOUNTER — Other Ambulatory Visit: Payer: Self-pay

## 2020-08-22 ENCOUNTER — Encounter: Payer: Self-pay | Admitting: Genetic Counselor

## 2020-08-22 ENCOUNTER — Other Ambulatory Visit: Payer: Self-pay | Admitting: *Deleted

## 2020-08-22 ENCOUNTER — Inpatient Hospital Stay: Payer: BC Managed Care – PPO | Attending: Genetic Counselor | Admitting: Genetic Counselor

## 2020-08-22 DIAGNOSIS — Z170 Estrogen receptor positive status [ER+]: Secondary | ICD-10-CM | POA: Insufficient documentation

## 2020-08-22 DIAGNOSIS — Z800 Family history of malignant neoplasm of digestive organs: Secondary | ICD-10-CM | POA: Diagnosis not present

## 2020-08-22 DIAGNOSIS — D0512 Intraductal carcinoma in situ of left breast: Secondary | ICD-10-CM

## 2020-08-22 DIAGNOSIS — Z803 Family history of malignant neoplasm of breast: Secondary | ICD-10-CM | POA: Diagnosis not present

## 2020-08-22 DIAGNOSIS — Z8041 Family history of malignant neoplasm of ovary: Secondary | ICD-10-CM | POA: Diagnosis not present

## 2020-08-22 LAB — CBC WITH DIFFERENTIAL (CANCER CENTER ONLY)
Abs Immature Granulocytes: 0.05 10*3/uL (ref 0.00–0.07)
Basophils Absolute: 0.1 10*3/uL (ref 0.0–0.1)
Basophils Relative: 1 %
Eosinophils Absolute: 0.1 10*3/uL (ref 0.0–0.5)
Eosinophils Relative: 2 %
HCT: 43.7 % (ref 36.0–46.0)
Hemoglobin: 14.5 g/dL (ref 12.0–15.0)
Immature Granulocytes: 1 %
Lymphocytes Relative: 35 %
Lymphs Abs: 3.2 10*3/uL (ref 0.7–4.0)
MCH: 28.7 pg (ref 26.0–34.0)
MCHC: 33.2 g/dL (ref 30.0–36.0)
MCV: 86.4 fL (ref 80.0–100.0)
Monocytes Absolute: 0.7 10*3/uL (ref 0.1–1.0)
Monocytes Relative: 7 %
Neutro Abs: 5 10*3/uL (ref 1.7–7.7)
Neutrophils Relative %: 54 %
Platelet Count: 280 10*3/uL (ref 150–400)
RBC: 5.06 MIL/uL (ref 3.87–5.11)
RDW: 13.3 % (ref 11.5–15.5)
WBC Count: 9.1 10*3/uL (ref 4.0–10.5)
nRBC: 0 % (ref 0.0–0.2)

## 2020-08-22 LAB — CMP (CANCER CENTER ONLY)
ALT: 17 U/L (ref 0–44)
AST: 17 U/L (ref 15–41)
Albumin: 4.1 g/dL (ref 3.5–5.0)
Alkaline Phosphatase: 103 U/L (ref 38–126)
Anion gap: 6 (ref 5–15)
BUN: 15 mg/dL (ref 6–20)
CO2: 30 mmol/L (ref 22–32)
Calcium: 10.2 mg/dL (ref 8.9–10.3)
Chloride: 103 mmol/L (ref 98–111)
Creatinine: 0.77 mg/dL (ref 0.44–1.00)
GFR, Estimated: >60 mL/min (ref >60)
Glucose, Bld: 108 mg/dL — ABNORMAL HIGH (ref 70–99)
Potassium: 3.4 mmol/L — ABNORMAL LOW (ref 3.5–5.1)
Sodium: 139 mmol/L (ref 135–145)
Total Bilirubin: 0.7 mg/dL (ref 0.3–1.2)
Total Protein: 8.2 g/dL — ABNORMAL HIGH (ref 6.5–8.1)

## 2020-08-22 LAB — GENETIC SCREENING ORDER

## 2020-08-22 NOTE — Progress Notes (Signed)
REFERRING PROVIDER: Jovita Kussmaul, [address],  [place] [address]  PRIMARY PROVIDER:  Patient, No Pcp Per  PRIMARY REASON FOR VISIT:  1. Ductal carcinoma in situ (DCIS) of left breast   2. Family history of breast cancer   3. Family history of pancreatic cancer   4. Family history of ovarian cancer   5. Family history of colon cancer      HISTORY OF PRESENT ILLNESS:   Melissa Allen, a 54 y.o. female, was seen for a [facility] cancer genetics consultation at the request of Dr. Marlou Starks due to a personal and family history of cancer.  Melissa Allen presents to clinic today to discuss the possibility of a hereditary predisposition to cancer, genetic testing, and to further clarify her future cancer risks, as well as potential cancer risks for family members.   In December 2021, at the age of 78, Melissa Allen was diagnosed with DCIS of the left breast. The treatment plan includes bilateral mastectomy, per her decision before seeing her surgeon. She reports having multiple bouts of pancreatitis, starting at age 49.   CANCER HISTORY:  Oncology History   No history exists.     RISK FACTORS:  Menarche was at age 68.  First live birth at age 26.  OCP use for approximately 10-20 years.  Ovaries intact: yes.  Hysterectomy: yes.  Menopausal status: postmenopausal.  HRT use: 0 years. Colonoscopy: yes; 3 polyps. Mammogram within the last year: yes. Number of breast biopsies: 1. Up to date with pelvic exams: n/a. Any excessive radiation exposure in the past: no  Past Medical History:  Diagnosis Date  . Allergy   . Anginal pain ([place])    one episode 4-5 years ago-[facility]-nonspecific-no problems since-  . Anxiety   . Arthritis    multiple areas of arthritis- DDD  . Asthma    recent admit to ER 01/11/12 for exac of asthma  . Calcaneal fracture   . Duplicated ureter, right   . Family history of breast cancer   . Family history of colon cancer   . Family  history of ovarian cancer   . Family history of pancreatic cancer   . Fibromyalgia   . History of kidney stones   . Hypertension   . Obesity (BMI 30.0-34.9)   . Pancreatitis 1986  . PONV (postoperative nausea and vomiting)   . Poor venous access    hard to start iv-  . 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 Shortness of breath dyspnea   . Wears glasses     Past Surgical History:  Procedure Laterality Date  . ABDOMINAL HYSTERECTOMY    . ANTERIOR LAT LUMBAR FUSION Left 08/17/2018   Procedure: LUMBAR THREE-FOUR ANTERIOR LATERAL INTERBODY FUSION;  Surgeon: Earnie Larsson, MD;  Location: [facility];  Service: Neurosurgery;  Laterality: Left;  . APPENDECTOMY  08/05/2017  . BLADDER SUSPENSION  03/01/2012   Procedure: TRANSVAGINAL TAPE (TVT) PROCEDURE;  Surgeon: Emily Filbert, MD;  Location: [facility];  Service: Gynecology;  Laterality: N/A;  . BUNIONECTOMY  1995   Right  . CHOLECYSTECTOMY  late 1980's      . CYSTOSCOPY  03/01/2012   Procedure: CYSTOSCOPY;  Surgeon: Emily Filbert, MD;  Location: [facility];  Service: Gynecology;  Laterality: N/A;  . CYSTOSCOPY W/ URETERAL STENT PLACEMENT Left 12/15/2014  Procedure: CYSTOSCOPY, LEFT URETEROSCOPYU WITH STONE EXTRACTION;  Surgeon: Irine Seal, MD;  Location: [facility];  Service: Urology;  Laterality: Left;  . CYSTOSCOPY W/ URETERAL STENT PLACEMENT Left 10/02/2016   Procedure: CYSTOSCOPY WITH RETROGRADE PYELOGRAM/URETERAL STENT PLACEMENT ureteroscopy, basket extraction;  Surgeon: Cleon Gustin, MD;  Location: [facility];  Service: Urology;  Laterality: Left;  . HARDWARE REMOVAL N/A 12/22/2016   Procedure: Lumbar Four-Five Removal of Hardware;  Surgeon: Earnie Larsson, MD;  Location: [facility];  Service: Neurosurgery;  Laterality: N/A;  . INCISION AND DRAINAGE ABSCESS Left 07/19/2013   Procedure: INCISION AND DRAINAGE LEFT LOWER EXTERMITY HEMATOMA;  Surgeon: Imogene Burn.  Georgette Dover, MD;  Location: [facility];  Service: General;  Laterality: Left;  Excision left lower leg mass  . KNEE CARTILAGE SURGERY     left  . KNEE SURGERY Left    x 2, Murphy/Sue, corrected Patella displacement   . LAMINECTOMY    . LAPAROSCOPIC APPENDECTOMY N/A 08/05/2017   Procedure: APPENDECTOMY LAPAROSCOPIC;  Surgeon: Kinsinger, Arta Bruce, MD;  Location: [facility];  Service: General;  Laterality: N/A;  . LUMBAR LAMINECTOMY  10/06   L4-5, Ray  . LUMBAR PERCUTANEOUS PEDICLE SCREW 1 LEVEL Left 08/17/2018   Procedure: LUMBAR THREE-FOUR LUMBAR PERCUTANEOUS PEDICLE SCREW;  Surgeon: Earnie Larsson, MD;  Location: [facility];  Service: Neurosurgery;  Laterality: Left;  . POSTERIOR CERVICAL LAMINECTOMY Right 02/28/2020   Procedure: Right Cervical Six-Seven Laminectomy and Foraminotomy;  Surgeon: Earnie Larsson, MD;  Location: [facility];  Service: Neurosurgery;  Laterality: Right;  3C  . R compressed pronator Right    Sypher, R arm  . RESECTION DISTAL CLAVICAL Right 06/30/2017   Procedure: RIGHT RESECTION DISTAL CLAVICAL;  Surgeon: Melrose Nakayama, MD;  Location: [facility];  Service: Orthopedics;  Laterality: Right;  . SHOULDER ACROMIOPLASTY Right 06/30/2017   Procedure: RIGHT SHOULDER ACROMIOPLASTY;  Surgeon: Melrose Nakayama, MD;  [facility];  Service: Orthopedics;  Laterality: Right;  . SHOULDER ARTHROSCOPY Right 06/30/2017   Procedure: ARTHROSCOPY DEBRIDEMENT RIGHT SHOULDER;  Surgeon: Melrose Nakayama, MD;  Location: [facility];  Service: Orthopedics;  Laterality: Right;  . SPINAL FUSION    . TENDON REPAIR  2013  . TENDON REPAIR Right 02/01/12  . TONSILLECTOMY  age 56  . TONSILLECTOMY      Social History   Socioeconomic History  . Marital status: Married    Spouse name: Not on file  . Number of children: 3  . Years of education: Not on file  . Highest education level: Not on file  Occupational History  . Occupation: Product manager: [employer]    Comment: [employer] (6th  grade)  Tobacco Use  . Smoking status: Never Smoker  . Smokeless tobacco: Never Used  Vaping Use  . Vaping Use: Never used  Substance and Sexual Activity  . Alcohol use: Not Currently  . Drug use: No  . Sexual activity: Yes    Birth control/protection: Surgical  Other Topics Concern  . 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  Significant diagnoses are listed below: Family History  Problem Relation Age of Onset  . Cancer Mother        pancreatic cancer  . Heart disease Father 49  . Colon polyps Father   . Colon cancer Paternal Grandfather        dx < 13  . Breast cancer Maternal Grandmother   . Ovarian cancer Maternal Grandmother 79  . Uterine cancer Other        Grandmother  . Breast cancer Other        Grandmother  . Ovarian cancer Other        Grandmother  . Diabetes Other        mother  . Other Neg Hx     The patient has three daughters who are cancer free. She has a fraternal twin sister who is cancer free.  Her mother is deceased and her father is living.  The patient's father has had colon polyps, but is cancer free. He is an only child.  His father died of colon cancer under age 14.  His mother died of non-cancer causes.  The patients mother was diagnosed with pancreatic cancer at 81. She had 11 full siblings, four had lymphoma.  There were multiple paternal half siblings that the patient was not familiar of.  The maternal grandmother had breast and ovarian cancer and died at 1.  The grandfather died  young from unknown causes.  Melissa Allen is unaware of previous family history of genetic testing for hereditary cancer risks. Patient's maternal ancestors are of Caucasian descent, and paternal ancestors are of [place] descent. There is no reported Ashkenazi Jewish ancestry. There is no known consanguinity.  GENETIC COUNSELING ASSESSMENT: Melissa Allen is a 54 y.o. female with a personal and family history of cancer which is somewhat suggestive of a hereditary cancer syndrome and predisposition to cancer given the combination of cancer in the family. We, therefore, discussed and recommended the following at today's visit.   DISCUSSION: We discussed that 5 - 10% of breast cancer is hereditary, with most cases associated with BRCA mutations.  There are other genes that can be associated with hereditary breast cancer syndromes.  These include ATM, CHEK2 and PALB2.  We discussed that testing is beneficial for several reasons including knowing how to follow individuals after completing their treatment, identifying whether potential treatment options such as PARP inhibitors would be beneficial, and understand if other family members could be at risk for cancer and allow them to undergo genetic testing.   We reviewed the characteristics, features and inheritance patterns of hereditary cancer syndromes. We also discussed genetic testing, including the appropriate family members to test, the process of testing, insurance coverage and turn-around-time for results. We discussed the implications of a negative, positive, carrier and/or variant of uncertain significant result. We recommended Melissa Allen pursue genetic testing for the multi cancer and pancreatitis gene panel.   Based on Melissa Allen's personal and family history of cancer, she meets medical criteria for genetic testing. Despite that she meets criteria, she may still have an out of pocket cost. We discussed that if her out of pocket cost for testing is over $100, the  laboratory will call and confirm whether she wants to proceed with testing.  If the out of pocket cost of testing is less than $100 she will be billed by the genetic testing laboratory.   PLAN: After considering the risks, benefits, and  limitations, Melissa Allen provided informed consent to pursue genetic testing and the blood sample was sent to [facility] for analysis of the multi-cancer and pancreatitis panel. Results should be available within approximately 2-3 weeks' time, at which point they will be disclosed by telephone to Melissa Allen, as will any additional recommendations warranted by these results. Melissa Allen will receive a summary of her genetic counseling visit and a copy of her results once available. This information will also be available in Epic.   Lastly, we encouraged Melissa Allen to remain in contact with cancer genetics annually so that we can continuously update the family history and inform her of any changes in cancer genetics and testing that may be of benefit for this family.   Melissa Allen questions were answered to her satisfaction today. Our contact information was provided should additional questions or concerns arise. Thank you for the referral and allowing [place] to share in the care of your patient.   Domenica P. Florene Glen, [place], [facility] Licensed, Insurance risk surveyor [email or web address] phone: [phone]  The patient was seen for a total of 60 minutes in face-to-face genetic counseling.  This patient was discussed with Drs. Magrinat, Lindi Adie and/or Burr Medico who agrees with the above.    _______________________________________________________________________ For Office Staff:  Number of people involved in session: 1 Was an Intern/ student involved with case: no

## 2020-08-22 NOTE — Progress Notes (Signed)
[place]  [phone] [phone]     ID: Melissa Allen DOB: Jun 16, 1966  MR#: [id number]  ZHG#:[id number]  Patient Care Team: Hayden Rasmussen, MD as PCP - General (Family Medicine) Mauro Kaufmann, RN as Oncology Nurse Navigator Rockwell Germany, RN as Oncology Nurse Navigator Johnston Maddocks, Virgie Dad, MD as Consulting Physician (Oncology) Jovita Kussmaul, MD as Consulting Physician (General Surgery) Irene Limbo, MD as Consulting Physician (Plastic Surgery) Earnie Larsson, MD as Consulting Physician (Neurosurgery) Donnamae Jude, MD as Consulting Physician (Obstetrics and Gynecology) Nicholaus Bloom, MD (Anesthesiology) Chauncey Cruel, MD OTHER MD:  CHIEF COMPLAINT: Ductal carcinoma in situ  CURRENT TREATMENT: Awaiting definitive surgery   HISTORY OF CURRENT ILLNESS: Melissa Allen had routine screening mammography on 07/17/2020 showing a possible abnormality in the left breast.  Breast density was category B.  She underwent left diagnostic mammography with tomography and left breast ultrasonography at [facility] on 08/01/2020 showing: breast density category B; 6 cm area of calcifications in upper-outer left breast (1-3 o'clock); 4 cm segmental area of calcifications in left medial breast at 9 o'clock.  Accordingly on 08/14/2020 she proceeded to biopsy of the left breast area in question. The pathology from this procedure ([id number]) showed: ductal carcinoma in situ, high grade, with calcifications and necrosis. Prognostic indicators significant for: estrogen receptor, 80% positive and progesterone receptor, 30% positive, both with moderate-strong staining intensity.   The patient's subsequent history is as detailed below.   INTERVAL HISTORY: Melissa Allen was evaluated in the breast cancer clinic on 08/23/2020 Her case was also presented at the multidisciplinary breast cancer conference on 08/22/2020. At that time a preliminary plan was proposed: Mammogram  versus dual lumpectomy, with sentinel lymph node sampling, likely no adjuvant radiation if mastectomy chosen, genetics testing, antiestrogens, genetics  She met with Dr. Marlou Starks this morning and they plan on bilateral skin sparing mastectomies with immediate reconstruction.  She also met with genetics yesterday.  Results of that test are pending.   REVIEW OF SYSTEMS: There were no specific symptoms leading to the original mammogram, which was routinely scheduled.  On her breast Melissa Allen's questionnaire she complains of night sweats insomnia fatigue pain blurred vision palpitations ankle swelling shortness of breath when climbing stairs heartburn back and joint pain difficulty walking and significant skin changes secondary to psoriasis.  By definition none of these are related to her cancer diagnosis   COVID 19 VACCINATION STATUS: Status post [place] x2, with the booster pending as of December 2021   PAST MEDICAL HISTORY: Past Medical History:  Diagnosis Date   Allergy    Anginal pain ([place])    one episode 4-5 years ago-[facility]-nonspecific-no problems since-   Anxiety    Arthritis    multiple areas of arthritis- DDD   Asthma    recent admit to ER 01/11/12 for exac of asthma   Calcaneal fracture    Duplicated ureter, right    Family history of breast cancer    Family history of colon cancer    Family history of ovarian cancer    Family history of pancreatic cancer    Fibromyalgia    History of kidney stones    Hypertension    Obesity (BMI 30.0-34.9)    Pancreatitis 1986   PONV (postoperative nausea and vomiting)    Poor venous access    hard to start iv-   Seizures ([place]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, was treated /w phenobarbital for a while, then taken  off - 02/24/2020 occurred 30 years ago per patient   Shortness of breath dyspnea    Wears glasses     PAST SURGICAL HISTORY: Past Surgical History:  Procedure Laterality Date   ABDOMINAL  HYSTERECTOMY     ANTERIOR LAT LUMBAR FUSION Left 08/17/2018   Procedure: LUMBAR THREE-FOUR ANTERIOR LATERAL INTERBODY FUSION;  Surgeon: Earnie Larsson, MD;  Location: [place];  Service: Neurosurgery;  Laterality: Left;   APPENDECTOMY  08/05/2017   BLADDER SUSPENSION  03/01/2012   Procedure: TRANSVAGINAL TAPE (TVT) PROCEDURE;  Surgeon: Emily Filbert, MD;  Location: [facility];  Service: Gynecology;  Laterality: N/A;   BUNIONECTOMY  1995   Right   CHOLECYSTECTOMY  late 1980's       CYSTOSCOPY  03/01/2012   Procedure: CYSTOSCOPY;  Surgeon: Emily Filbert, MD;  Location: [facility];  Service: Gynecology;  Laterality: N/A;   CYSTOSCOPY W/ URETERAL STENT PLACEMENT Left 12/15/2014   Procedure: CYSTOSCOPY, LEFT URETEROSCOPYU WITH STONE EXTRACTION;  Surgeon: Irine Seal, MD;  Location: [facility];  Service: Urology;  Laterality: Left;   CYSTOSCOPY W/ URETERAL STENT PLACEMENT Left 10/02/2016   Procedure: CYSTOSCOPY WITH RETROGRADE PYELOGRAM/URETERAL STENT PLACEMENT ureteroscopy, basket extraction;  Surgeon: Cleon Gustin, MD;  Location: [facility];  Service: Urology;  Laterality: Left;   HARDWARE REMOVAL N/A 12/22/2016   Procedure: Lumbar Four-Five Removal of Hardware;  Surgeon: Earnie Larsson, MD;  Location: [place];  Service: Neurosurgery;  Laterality: N/A;   INCISION AND DRAINAGE ABSCESS Left 07/19/2013   Procedure: INCISION AND DRAINAGE LEFT LOWER EXTERMITY HEMATOMA;  Surgeon: Imogene Burn. Georgette Dover, MD;  Location: [place];  Service: General;  Laterality: Left;  Excision left lower leg mass   KNEE CARTILAGE SURGERY     left   KNEE SURGERY Left    x 2, Murphy/Sue, corrected Patella displacement    LAMINECTOMY     LAPAROSCOPIC APPENDECTOMY N/A 08/05/2017   Procedure: APPENDECTOMY LAPAROSCOPIC;  Surgeon: Kieth Brightly Arta Bruce, MD;  Location[facility];  Service: General;  Laterality: N/A;   LUMBAR LAMINECTOMY  10/06   L4-5, Ray   LUMBAR PERCUTANEOUS PEDICLE SCREW 1 LEVEL Left 08/17/2018   Procedure:  LUMBAR THREE-FOUR LUMBAR PERCUTANEOUS PEDICLE SCREW;  Surgeon: Earnie Larsson, MD;  Location: [place];  Service: Neurosurgery;  Laterality: Left;   POSTERIOR CERVICAL LAMINECTOMY Right 02/28/2020   Procedure: Right Cervical Six-Seven Laminectomy and Foraminotomy;  Surgeon: Earnie Larsson, MD;  Location: [place];  Service: Neurosurgery;  Laterality: Right;  3C   R compressed pronator Right    Sypher, R arm   RESECTION DISTAL CLAVICAL Right 06/30/2017   Procedure: RIGHT RESECTION DISTAL CLAVICAL;  Surgeon: Melrose Nakayama, MD;  Location: [place];  Service: Orthopedics;  Laterality: Right;   SHOULDER ACROMIOPLASTY Right 06/30/2017   Procedure: RIGHT SHOULDER ACROMIOPLASTY;  Surgeon: Melrose Nakayama, MD;  Location: [place];  Service: Orthopedics;  Laterality: Right;   SHOULDER ARTHROSCOPY Right 06/30/2017   Procedure: ARTHROSCOPY DEBRIDEMENT RIGHT SHOULDER;  Surgeon: Melrose Nakayama, MD;  Location: [place];  Service: Orthopedics;  Laterality: Right;   SPINAL FUSION     TENDON REPAIR  2013   TENDON REPAIR Right 02/01/12   TONSILLECTOMY  age 63   TONSILLECTOMY      FAMILY HISTORY: Family History  Problem Relation Age of Onset   Cancer Mother        pancreatic cancer   Heart disease Father 64   Colon polyps Father    Colon cancer Paternal Grandfather  dx < 50   Breast cancer Maternal Grandmother    Ovarian cancer Maternal Grandmother 79   Uterine cancer Other        Grandmother   Breast cancer Other        Grandmother   Ovarian cancer Other        Grandmother   Diabetes Other        mother   Other Neg Hx   The patient's father is 11 years old as of 09/17/20.  The patient's mother died at age 2 from pancreatic cancer.  The patient has 1 fraternal twin sister, no brothers.  The maternal grandmother had breast and ovarian cancer.  At least one maternal aunt had lymphoma.  The paternal grandfather died from colon cancer.   GYNECOLOGIC HISTORY:  Patient's last menstrual  period was 12/21/2011. Menarche: 54 years old Age at first live birth: 54 years old GX P 3 LMP 69 HRT continues on estradiol Hysterectomy? Yes, 02/2012 BSO?  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 Husband Joe retired from teaching special needs in high school and also has a Biochemist, clinical.  Their daughter Ronnette Juniper, 69 years old lives in [place] and works in the guidance office at [employer].  Daughter Marcy Siren 51 lives in [place] and is a homemaker.  Daughter Lynnea Ferrier 22 lives in [place], graduated from mapping criminal justice and plans to work training K9s   ADVANCED DIRECTIVES: The patient's husband is her healthcare power of attorney   HEALTH MAINTENANCE: Social History   Tobacco Use   Smoking status: Never Smoker   Smokeless tobacco: Never Used  Scientific laboratory technician Use: Never used  Substance Use Topics   Alcohol use: Not Currently   Drug use: No     Colonoscopy: 2021, Henrene Pastor  PAP: s/p hysterectomy  Bone density:    Allergies  Allergen Reactions   Chlorquinaldol Rash    11/30/19 patch testing: positive reaction to Quinoline Mix (contains clioquinol and chlorquinaldol)   Clioquinol Rash    11/30/19 patch testing: positive reaction to Quinoline Mix (contains clioquinol and chlorquinaldol)    Other Anaphylaxis    Pt has anaphylactic reaction to something during procedure - unsure of what   Per patient, Quinoline Mix  - based on allergy testing   Latex Itching, Swelling and Dermatitis    SWELLING FROM FACE MASK RING AFTER LAST SURGERY    Lisinopril Cough        Codeine Nausea And Vomiting   Hydrocodone Itching   Tape Rash    TOLERATES PAPER TAPE ONLY NO PINK SURGICAL TAPE EITHER, EKG Leads     Current Outpatient  Medications  Medication Sig Dispense Refill   albuterol (PROAIR HFA) 108 (90 Base) MCG/ACT inhaler INHALE TWO PUFFS EVERY FOUR HOURS AS NEEDED SHORTNESS OF BREATH 8.5 g 2   doxepin (SINEQUAN) 10 MG capsule Take 30 mg by mouth at bedtime.      DULoxetine (CYMBALTA) 60 MG capsule Take 90 mg by mouth daily.      erythromycin ophthalmic ointment Place 1 application into both eyes as directed. States BID     estradiol (ESTRACE) 1 MG tablet Take 1 tablet (1 mg total) by mouth daily. 90 tablet 3   fluticasone (FLONASE) 50 MCG/ACT  nasal spray USE 2 SPRAYS INTO EACH NOSTRIL ONCE DAILY AS DIRECTED 16 g 4   ibuprofen (ADVIL,MOTRIN) 200 MG tablet You can safely take 3 tablets every 6 hours as needed for pain.  He can alternate this with plain Tylenol.  You can also alternate with the prescribed narcotic.  I would recommend to use the Tylenol and ibuprofen as your first line pain medications. (Patient taking differently: Take 600 mg by mouth every 6 (six) hours as needed (pain). You can safely take 3 tablets every 6 hours as needed for pain.  He can alternate this with plain Tylenol.  You can also alternate with the prescribed narcotic.  I would recommend to use the Tylenol and ibuprofen as your first line pain medications.)     losartan (COZAAR) 50 MG tablet TAKE 1 TABLET BY MOUTH EVERY DAY 90 tablet 0   Olopatadine HCl 0.2 % SOLN PLACE ONE DROP INTO BOTH EYES TWO TIMES DAILY AS NEEDED 7.5 mL 1   pantoprazole (PROTONIX) 40 MG tablet Take 1 tablet (40 mg total) by mouth 2 (two) times daily before a meal. 180 tablet 1   RESTASIS 0.05 % ophthalmic emulsion Place 1 drop into both eyes 3 (three) times daily.      Suvorexant (BELSOMRA) 20 MG TABS Take 20 mg by mouth.     tapentadol (NUCYNTA) 50 MG tablet Nucynta 50 mg tablet     triamterene-hydrochlorothiazide (DYAZIDE) 37.5-25 MG capsule Take 1 each (1 capsule total) by mouth daily. 90 capsule 1   EPINEPHrine 0.3 mg/0.3 mL IJ SOAJ injection Inject 0.3  mLs (0.3 mg total) into the muscle as needed for anaphylaxis. 1 each 1   No current facility-administered medications for this visit.    OBJECTIVE: White woman who stood through most of our interview  Vitals:   08/23/20 1554  BP: (!) 117/59  Pulse: (!) 106  Resp: 20  Temp: 98.8 F (37.1 C)  SpO2: 100%     Body mass index is 34.37 kg/m.   Wt Readings from Last 3 Encounters:  08/23/20 194 lb (88 kg)  05/31/20 190 lb (86.2 kg)  04/20/20 189 lb 3.2 oz (85.8 kg)      ECOG FS:1 - Symptomatic but completely ambulatory  Ocular: Sclerae unicteric, pupils round and equal Ear-nose-throat: Wearing a mask Lymphatic: No cervical or supraclavicular adenopathy Lungs no rales or rhonchi Heart regular rate and rhythm Abd soft, nontender, positive bowel sounds MSK no focal spinal tenderness, no joint edema Neuro: non-focal, well-oriented, appropriate affect Breasts: The right breast is unremarkable.  The left breast has an ecchymoses at the site of recent biopsies.  There are no palpable masses.  There are no skin or nipple changes of concern.  Both axillae are benign   LAB RESULTS:  CMP     Component Value Date/Time   NA 139 08/22/2020 1142   K 3.4 (L) 08/22/2020 1142   CL 103 08/22/2020 1142   CO2 30 08/22/2020 1142   GLUCOSE 108 (H) 08/22/2020 1142   BUN 15 08/22/2020 1142   CREATININE 0.77 08/22/2020 1142   CREATININE 0.74 08/04/2011 1757   CALCIUM 10.2 08/22/2020 1142   PROT 8.2 (H) 08/22/2020 1142   ALBUMIN 4.1 08/22/2020 1142   AST 17 08/22/2020 1142   ALT 17 08/22/2020 1142   ALKPHOS 103 08/22/2020 1142   BILITOT 0.7 08/22/2020 1142   GFRNONAA >60 08/22/2020 1142   GFRAA >60 02/24/2020 1530    No results found for: TOTALPROTELP, ALBUMINELP, A1GS, A2GS,  Arnaldo Natal, GAMS, MSPIKE, SPEI  Lab Results  Component Value Date   WBC 9.1 08/22/2020   NEUTROABS 5.0 08/22/2020   HGB 14.5 08/22/2020   HCT 43.7 08/22/2020   MCV 86.4 08/22/2020   PLT 280 08/22/2020     No results found for: LABCA2  No components found for: [id number]  No results for input(s): INR in the last 168 hours.  No results found for: LABCA2  No results found for: ZHG992  No results found for: EQA834  No results found for: HDQ222  No results found for: CA2729  No components found for: HGQUANT  No results found for: CEA1 / No results found for: CEA1   No results found for: AFPTUMOR  No results found for: CHROMOGRNA  No results found for: KPAFRELGTCHN, LAMBDASER, KAPLAMBRATIO (kappa/lambda light chains)  No results found for: HGBA, HGBA2QUANT, HGBFQUANT, HGBSQUAN (Hemoglobinopathy evaluation)   No results found for: LDH  No results found for: IRON, TIBC, IRONPCTSAT (Iron and TIBC)  No results found for: FERRITIN  Urinalysis    Component Value Date/Time   COLORURINE YELLOW 12/27/2019 1127   APPEARANCEUR [place] Cloud[place] (A) 12/27/2019 1127   LABSPEC 1.020 12/27/2019 1127   PHURINE 8.5 (A) 12/27/2019 1127   GLUCOSEU NEGATIVE 12/27/2019 1127   HGBUR NEGATIVE 12/27/2019 1127   BILIRUBINUR NEGATIVE 12/27/2019 1127   BILIRUBINUR neg 09/09/2017 1642   KETONESUR NEGATIVE 12/27/2019 1127   PROTEINUR NEGATIVE 08/17/2019 1324   UROBILINOGEN 0.2 12/27/2019 1127   NITRITE NEGATIVE 12/27/2019 1127   LEUKOCYTESUR SMALL (A) 12/27/2019 1127     STUDIES: MR Abdomen W Wo Contrast  Result Date: 08/13/2020 CLINICAL DATA:  Follow-up pancreatic cyst EXAM: MRI ABDOMEN WITHOUT AND WITH CONTRAST TECHNIQUE: Multiplanar multisequence MR imaging of the abdomen was performed both before and after the administration of intravenous contrast. CONTRAST:  69m GADAVIST GADOBUTROL 1 MMOL/ML IV SOLN COMPARISON:  MR abdomen, 01/11/2020, 07/20/2019, CT abdomen pelvis, 08/05/2017 FINDINGS: Lower chest: No acute findings.  Bilateral breast implants. Hepatobiliary: No mass or other parenchymal abnormality identified. Large, simple cyst of the anterior right lobe of the liver measuring 8.6 x 7.9  cm (series 5, image 10). Additional smaller cysts in the left lobe. Status post cholecystectomy. No biliary ductal dilatation. Pancreas: Unchanged appearance of a fluid signal cystic lesion in the superior right pancreatic head measuring approximately 1.9 x 1.1 cm (series 5, image 23). There is an additional subcentimeter cyst of the superior pancreatic neck measuring 8 mm (series 5, image 20). No pancreatic ductal dilatation. No solid mass, inflammatory changes, or other parenchymal abnormality identified. Spleen:  Within normal limits in size and appearance. Adrenals/Urinary Tract: Definitively benign, fat containing right adrenal adenoma measuring 1.0 cm (series 8, image 80). No evidence of hydronephrosis. Stomach/Bowel: Visualized portions within the abdomen are unremarkable. Vascular/Lymphatic: No pathologically enlarged lymph nodes identified. No abdominal aortic aneurysm demonstrated. Other:  None. Musculoskeletal: No suspicious bone lesions identified. IMPRESSION: 1. Unchanged appearance of fluid signal cystic lesions in pancreatic head and neck, measuring up to 1.9 cm. Stability of the dominant lesion has been established through greater than 3 years dating back to most remote contrast enhanced examination dated 08/05/2017, and this lesion may reflect a pancreatic pseudocyst given history of pancreatitis or alternately IPMN. Recommend additional follow-up MRI at 1 year with a goal of establishing an initial 5 years of stability, at which time follow-up interval may increase to 2 years to establish a total of 9 years stability. These findings follow ACR white paper recommendations on incidental  pancreatic cystic lesions. 2. Definitively benign, fat containing 1.0 cm right adrenal adenoma. 3. Large, benign simple cyst of the anterior right lobe of the liver measuring 8.6 x 7.9 cm. Additional smaller cysts in the left lobe of the liver. Electronically Signed   By: Eddie Candle M.D.   On: 08/13/2020 13:22    MR 3D Recon At Scanner  Result Date: 08/13/2020 CLINICAL DATA:  Follow-up pancreatic cyst EXAM: MRI ABDOMEN WITHOUT AND WITH CONTRAST TECHNIQUE: Multiplanar multisequence MR imaging of the abdomen was performed both before and after the administration of intravenous contrast. CONTRAST:  46m GADAVIST GADOBUTROL 1 MMOL/ML IV SOLN COMPARISON:  MR abdomen, 01/11/2020, 07/20/2019, CT abdomen pelvis, 08/05/2017 FINDINGS: Lower chest: No acute findings.  Bilateral breast implants. Hepatobiliary: No mass or other parenchymal abnormality identified. Large, simple cyst of the anterior right lobe of the liver measuring 8.6 x 7.9 cm (series 5, image 10). Additional smaller cysts in the left lobe. Status post cholecystectomy. No biliary ductal dilatation. Pancreas: Unchanged appearance of a fluid signal cystic lesion in the superior right pancreatic head measuring approximately 1.9 x 1.1 cm (series 5, image 23). There is an additional subcentimeter cyst of the superior pancreatic neck measuring 8 mm (series 5, image 20). No pancreatic ductal dilatation. No solid mass, inflammatory changes, or other parenchymal abnormality identified. Spleen:  Within normal limits in size and appearance. Adrenals/Urinary Tract: Definitively benign, fat containing right adrenal adenoma measuring 1.0 cm (series 8, image 80). No evidence of hydronephrosis. Stomach/Bowel: Visualized portions within the abdomen are unremarkable. Vascular/Lymphatic: No pathologically enlarged lymph nodes identified. No abdominal aortic aneurysm demonstrated. Other:  None. Musculoskeletal: No suspicious bone lesions identified. IMPRESSION: 1. Unchanged appearance of fluid signal cystic lesions in pancreatic head and neck, measuring up to 1.9 cm. Stability of the dominant lesion has been established through greater than 3 years dating back to most remote contrast enhanced examination dated 08/05/2017, and this lesion may reflect a pancreatic pseudocyst given  history of pancreatitis or alternately IPMN. Recommend additional follow-up MRI at 1 year with a goal of establishing an initial 5 years of stability, at which time follow-up interval may increase to 2 years to establish a total of 9 years stability. These findings follow ACR white paper recommendations on incidental pancreatic cystic lesions. 2. Definitively benign, fat containing 1.0 cm right adrenal adenoma. 3. Large, benign simple cyst of the anterior right lobe of the liver measuring 8.6 x 7.9 cm. Additional smaller cysts in the left lobe of the liver. Electronically Signed   By: AEddie CandleM.D.   On: 08/13/2020 13:22     ELIGIBLE FOR AVAILABLE RESEARCH PROTOCOL: no  ASSESSMENT: 55y.o. [place] woman status post left breast biopsy x2 on 08/14/2020, both sites showing high-grade ductal carcinoma in situ, a single prognostic panel showing strong estrogen receptor positivity, moderate progesterone receptor positivity  (1) definitive surgery pending  (2) genetics results pending  PLAN: I met today with Melissa Allen review her new diagnosis. Specifically we discussed the biology of her breast cancer, its diagnosis, staging, treatment  options and prognosis. KKierstonunderstands that in noninvasive ductal carcinoma, also called ductal carcinoma in situ ("DCIS") the breast cancer cells remain trapped in the ducts were they started. They cannot travel to a vital organ. For that reason these cancers in themselves are not life-threatening.  If the whole breast is removed then all the ducts are removed and since the cancer cells are trapped in the ducts, the cure rate with mastectomy for noninvasive  breast cancer is approximately 99%.  While we generally recommend lumpectomy in these cases, this is not an option in Melissa Allen case.  There is significant multicentric disease.  Instead she is opting for left mastectomy and we will do a sentinel lymph node check in case there is hidden invasive disease so that we  do not have to proceed to a full axillary dissection under those circumstances  She is very interested in removing the contralateral breast partly for symmetry and partly for psychological reasons.  She does not think she could manage the concerned that she would develop cancer in the contralateral breast at some point in the future.  For those reasons I supportive of her decision for bilateral mastectomies.  Of course if she does have mastectomies she does not have to receive adjuvant radiation or consider antiestrogens.  In addition she can safely continue estradiol replacement at her and her gynecologist's discretion since there will be no breast tissue to be concerned about  Melissa Allen does qualify for genetics testing. In patients who carry a deleterious mutation [for example in a  BRCA gene], bilateral mastectomies is a standard option. Of course, if there is a deleterious mutation bilateral oophorectomy might be necessary as there is no standard screening protocol for ovarian cancer.  Melissa Allen has a good understanding of the overall plan. She agrees with it. She knows the goal of treatment in her case is cure.  I am not making a return appointment for her here, but of course I will be glad to see her at any point in the future if I can be of any help  Total encounter time 50 minutes.Melissa Allen Jews C. Salihah Peckham, MD 08/23/2020 5:14 PM Medical Oncology and Hematology [facility] [place], [place] [address] Tel. [phone]    Fax. [phone]   This document serves as a record of services personally performed by Lurline Del, MD. It was created on his behalf by Wilburn Mylar, a trained medical scribe. The creation of this record is based on the scribe's personal observations and the provider's statements to them.   I, Lurline Del MD, have reviewed the above documentation for accuracy and completeness, and I agree with the above.  *Total Encounter Time as defined  by the Centers for Medicare and Medicaid Services includes, in addition to the face-to-face time of a patient visit (documented in the note above) non-face-to-face time: obtaining and reviewing outside history, ordering and reviewing medications, tests or procedures, care coordination (communications with other health care professionals or caregivers) and documentation in the medical record.

## 2020-08-23 ENCOUNTER — Ambulatory Visit: Payer: Self-pay | Admitting: General Surgery

## 2020-08-23 ENCOUNTER — Inpatient Hospital Stay (HOSPITAL_BASED_OUTPATIENT_CLINIC_OR_DEPARTMENT_OTHER): Payer: BC Managed Care – PPO | Admitting: Oncology

## 2020-08-23 ENCOUNTER — Encounter: Payer: Self-pay | Admitting: *Deleted

## 2020-08-23 ENCOUNTER — Inpatient Hospital Stay: Payer: BC Managed Care – PPO

## 2020-08-23 ENCOUNTER — Other Ambulatory Visit: Payer: Self-pay | Admitting: Genetic Counselor

## 2020-08-23 VITALS — BP 117/59 | HR 106 | Temp 98.8°F | Resp 20 | Ht 63.0 in | Wt 194.0 lb

## 2020-08-23 DIAGNOSIS — D0512 Intraductal carcinoma in situ of left breast: Secondary | ICD-10-CM

## 2020-08-23 DIAGNOSIS — Z170 Estrogen receptor positive status [ER+]: Secondary | ICD-10-CM

## 2020-08-23 DIAGNOSIS — Z0289 Encounter for other administrative examinations: Secondary | ICD-10-CM

## 2020-08-24 ENCOUNTER — Telehealth: Payer: Self-pay | Admitting: Oncology

## 2020-08-24 NOTE — Telephone Encounter (Signed)
No 12/16 LOS. No changes made to pt's schedule.

## 2020-08-30 ENCOUNTER — Telehealth: Payer: Self-pay | Admitting: Genetic Counselor

## 2020-08-30 ENCOUNTER — Encounter: Payer: Self-pay | Admitting: Genetic Counselor

## 2020-08-30 DIAGNOSIS — Z1379 Encounter for other screening for genetic and chromosomal anomalies: Secondary | ICD-10-CM | POA: Insufficient documentation

## 2020-08-30 NOTE — Telephone Encounter (Signed)
Revealed negative genetic testing.  Discussed that we do not know why she has breast cancer or why there is cancer in the family. It could be sporadic/familial, due to a different gene that we are not testing, or maybe our current technology may not be able to pick something up.  It will be important for her to keep in contact with genetics to keep up with whether additional testing may be needed.   

## 2020-09-03 ENCOUNTER — Ambulatory Visit: Payer: Self-pay | Admitting: Genetic Counselor

## 2020-09-03 DIAGNOSIS — Z1379 Encounter for other screening for genetic and chromosomal anomalies: Secondary | ICD-10-CM

## 2020-09-03 NOTE — Progress Notes (Signed)
HPI:  Ms. Parchment was previously seen in the [place] [facility] due to a personal and family history of cancer and concerns regarding a hereditary predisposition to cancer. Please refer to our prior cancer genetics clinic note for more information regarding our discussion, assessment and recommendations, at the time. Ms. Notarianni recent genetic test results were disclosed to her, as were recommendations warranted by these results. These results and recommendations are discussed in more detail below.  CANCER HISTORY:  Oncology History  Ductal carcinoma in situ (DCIS) of left breast  08/16/2020 Initial Diagnosis   Ductal carcinoma in situ (DCIS) of left breast   08/29/2020 Genetic Testing   Negative genetic testing: no pathogenic variants detected in [facility] of Multi-Cancer + Pancreatitis Panel.  The report date is August 29, 2020.      The Multi-Cancer+Pancreatitis Gene Panel includes sequencing and deletion/duplication analysis of the following genes 92 genes: AIP, ALK, APC*, ATM*, AXIN2, BAP1, BARD1, BLM, BMPR1A, BRCA1, BRCA2, BRIP1, CASR, CDC73, CDH1, CDK4, CDKN1B, CDKN1C, CDKN2A (p14ARF), CDKN2A (p16INK4a), CEBPA, CFTR*, CHEK2, CPA1, CTNNA1, CTRC, DICER1*, DIS3L2, EGFR, EPCAM*, FANCC, FH*, FLCN, GATA2, GPC3*, GREM1*, HOXB13, HRAS, KIT, MAX*, MEN1*, MET*, MITF*, MLH1*, MSH2*, MSH3*, MSH6*, MUTYH, NBN, NF1*, NF2, NTHL1, PALB2, PALLD, PDGFRA, PHOX2B*, PMS2*, POLD1*, POLE, POT1, PRKAR1A, PRSS1*, PTCH1, PTEN*, RAD50, RAD51C, RAD51D, RB1*, RECQL4*, RET, RNF43, RUNX1, SDHA*, SDHAF2, SDHB, SDHC*, SDHD, SMAD4, SMARCA4, SMARCB1, SMARCE1, SPINK1, STK11, SUFU, TERC, TERT, TMEM127, TP53, TSC1*, TSC2, VHL, WRN*, and WT1.      FAMILY HISTORY:  We obtained a detailed, 4-generation family history.  Significant diagnoses are listed below: Family History  Problem Relation Age of Onset   Cancer Mother        pancreatic cancer   Heart disease Father 12   Colon polyps Father    Colon cancer  Paternal Grandfather        dx < 34   Breast cancer Maternal Grandmother    Ovarian cancer Maternal Grandmother 79   Uterine cancer Other        Grandmother   Breast cancer Other        Grandmother   Ovarian cancer Other        Grandmother   Diabetes Other        mother   Other Neg Hx     The patient has three daughters who are cancer free. She has a fraternal twin sister who is cancer free.  Her mother is deceased and her father is living.  The patient's father has had colon polyps, but is cancer free. He is an only child.  His father died of colon cancer under age 95.  His mother died of non-cancer causes.  The patients mother was diagnosed with pancreatic cancer at 51. She had 11 full siblings, four had lymphoma.  There were multiple paternal half siblings that the patient was not familiar of.  The maternal grandmother had breast and ovarian cancer and died at 33.  The grandfather died young from unknown causes.  Ms. Kilbride is unaware of previous family history of genetic testing for hereditary cancer risks. Patient's maternal ancestors are of Caucasian descent, and paternal ancestors are of [place] descent. There is no reported Ashkenazi Jewish ancestry. There is no known consanguinity.  GENETIC TEST RESULTS: Genetic testing reported out on August 29, 2020 through the Custom-cancer panel found no pathogenic mutations. The Custom-Gene Panel offered by [facility] includes sequencing and/or deletion duplication testing of the following 92 genes: AIP, ALK, APC*, ATM*, AXIN2, BAP1,  BARD1, BLM, BMPR1A, BRCA1, BRCA2, BRIP1, CASR, CDC73, CDH1, CDK4, CDKN1B, CDKN1C, CDKN2A (p14ARF), CDKN2A (p16INK4a), CEBPA, CFTR*, CHEK2, CPA1, CTNNA1, CTRC, DICER1*, DIS3L2, EGFR, EPCAM*, FANCC, FH*, FLCN, GATA2, GPC3*, GREM1*, HOXB13, HRAS, KIT, MAX*, MEN1*, MET*, MITF*, MLH1*, MSH2*, MSH3*, MSH6*, MUTYH, NBN, NF1*, NF2, NTHL1, PALB2, PALLD, PDGFRA, PHOX2B*, PMS2*, POLD1*, POLE, POT1, PRKAR1A, PRSS1*,  PTCH1, PTEN*, RAD50, RAD51C, RAD51D, RB1*, RECQL4*, RET, RNF43, RUNX1, SDHA*, SDHAF2, SDHB, SDHC*, SDHD, SMAD4, SMARCA4, SMARCB1, SMARCE1, SPINK1, STK11, SUFU, TERC, TERT, TMEM127, TP53, TSC1*, TSC2, VHL, WRN*, and WT1. The test report has been scanned into EPIC and is located under the Molecular Pathology section of the Results Review tab.  A portion of the result report is included below for reference.     We discussed with Ms. Rogoff that because current genetic testing is not perfect, it is possible there may be a gene mutation in one of these genes that current testing cannot detect, but that chance is small.  We also discussed, that there could be another gene that has not yet been discovered, or that we have not yet tested, that is responsible for the cancer diagnoses in the family. It is also possible there is a hereditary cause for the cancer in the family that Ms. Stringfield did not inherit and therefore was not identified in her testing.  Therefore, it is important to remain in touch with cancer genetics in the future so that we can continue to offer Ms. Remmers the most up to date genetic testing.   ADDITIONAL GENETIC TESTING: We discussed with Ms. Terlecki that her genetic testing was fairly extensive.  If there are genes identified to increase cancer risk that can be analyzed in the future, we would be happy to discuss and coordinate this testing at that time.    CANCER SCREENING RECOMMENDATIONS: Ms. Skousen test result is considered negative (normal).  This means that we have not identified a hereditary cause for her personal and family history of cancer at this time. Most cancers happen by chance and this negative test suggests that her cancer may fall into this category.    While reassuring, this does not definitively rule out a hereditary predisposition to cancer. It is still possible that there could be genetic mutations that are undetectable by current technology. There could be genetic mutations  in genes that have not been tested or identified to increase cancer risk.  Therefore, it is recommended she continue to follow the cancer management and screening guidelines provided by her oncology and primary healthcare provider.   An individual's cancer risk and medical management are not determined by genetic test results alone. Overall cancer risk assessment incorporates additional factors, including personal medical history, family history, and any available genetic information that may result in a personalized plan for cancer prevention and surveillance  RECOMMENDATIONS FOR FAMILY MEMBERS:  Individuals in this family might be at some increased risk of developing cancer, over the general population risk, simply due to the family history of cancer.  We recommended women in this family have a yearly mammogram beginning at age 3, or 41 years younger than the earliest onset of cancer, an annual clinical breast exam, and perform monthly breast self-exams. Women in this family should also have a gynecological exam as recommended by their primary provider. All family members should be referred for colonoscopy starting at age 45.  FOLLOW-UP: Lastly, we discussed with Ms. Domek that cancer genetics is a rapidly advancing field and it is possible that new genetic tests will  be appropriate for her and/or her family members in the future. We encouraged her to remain in contact with cancer genetics on an annual basis so we can update her personal and family histories and let her know of advances in cancer genetics that may benefit this family.   Our contact number was provided. Ms. Placide questions were answered to her satisfaction, and she knows she is welcome to call us at anytime with additional questions or concerns.   Roma Kayser, [place], [facility] Licensed, Certified Genetic Counselor [email or web address]

## 2020-09-14 ENCOUNTER — Encounter: Payer: Self-pay | Admitting: *Deleted

## 2020-10-08 NOTE — H&P (Signed)
Subjective:     Patient ID: Melissa Allen is a 55 y.o. female.  HPI  Here for follow up discussion breast reconstruction prior to planned bilateral mastectomies. Presented following screening MMG showing 9 cm area pleomorphic calcifications involving all 4 quadrants. Diagnostic MMG/US6 cm area of calcifications in the left UOQ (1-3 o'clock); 4 cm segmental area of calcifications in left medial breast at 9 o'clock. Biopsy labeled lat upper quadrant and medial mid to lower both showed high grade DCIS with calcifications and necrosis, ER/PR+.  Mother with pancreatic ca. MGM with breast and ovarian ca. Genetics negative.  Desires bilateral mastectomies.  History subglandular augmentation mammaplasty with saline implants Dr. Dessie Coma. Reports textured implants, believes [place]. On imaging bilateral rupture present.  Current 46 D. Would not mind smaller volume. Wt within 10 lbs of current over last year  PMH significant for HTN, PONV, fibromyalgia, multiple bouts pancreatitis, chronic back pain on Nucynta, has pain contract with Dr. Hardin Negus. Notes she was unable for several years to raise arm or turn head and was unable to complete MMG during this time.  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 Recently started part time work at [employer]. Lives with spouse who is retired Pharmacist, hospital. Three adult daughters.   Review of Systems  Musculoskeletal: Positive for arthralgias, back pain, myalgias, neck pain and neck stiffness.   Remainder 12 point review negative    Objective:   Physical Exam Cardiovascular:     Rate and Rhythm: Normal rate and regular rhythm.     Heart sounds: Normal heart sounds.  Pulmonary:     Effort: Pulmonary effort is normal.     Breath sounds: Normal breath sounds.  Chest:  Breasts:     Right: No axillary adenopathy.     Left: No axillary adenopathy.    Abdominal:     Comments: RUQ Kocher scar  Lymphadenopathy:      Upper Body:     Right upper body: No axillary adenopathy.     Left upper body: No axillary adenopathy.  Skin:    Comments: Fitzpatrick 2     No palpable masses Periareolar scars Grade 2-3 ptosis bilateral SN to nipple R 27 L 27 cm BW R 26 L 26 cm CW 16 cm Nipple to IMF R 9 L 10 cm     Assessment:     Left breast DCIS Hx augmentation mammaplasty-ruptured saline implant    Plan:      Plan bilateral skin reduction mastectomies with immediate expander acellular dermis reconstruction.   Reviewed anchor type scar, resection NAC with this.  Discussed use of acellular dermis in reconstruction, cadaveric source, incorporation over several weeks, risk that if has seroma or infection can act as additional nidus for infection if not incorporated.Reviewed this is an off label use of acellular dermis.  Reviewedprepectoral vs sub pectoral reconstruction. Discussed with patient and benefit of this is no animation deformity, may be less pain. Risk may be more visible rippling over upper poles, greater need of ADM. Reviewed pre pectoral would require larger amount acellular dermis, more drains. Discussed any type reconstruction also risks long term displacement implant and visible rippling. If prepectoral counseled I would recommend she be comfortable with silicone implants as more options that have less rippling. Sheagrees to prepectoral placement.  Additional risks including but not limited to bleeding, hematoma, infection, damage to adjacent structures, mastectomy flap necrosis, need for additional procedures, blood clots in legs or lungs reviewed.  Discussed risk COVID infectionthrough  this elective surgery. Patient will receive COVID testing prior to surgery. Discussed even if patient receivesa negative test result, the tests in some cases may fail to detect the virus or patient maycontract COVID after the test.COVID 19 infectionbefore/during/aftersurgery may result in lead to a  higher chance of complication and death.  Counseled given her chronic pain defer to her pain specialist to manage pain medication use post operatively.  Drain teaching completed. Rx for [place] given. Rx for Bactrim given. Has Xanaflex at home. Meeting with pain provider prior to surgery to discuss post op pain management.

## 2020-10-11 ENCOUNTER — Encounter (HOSPITAL_BASED_OUTPATIENT_CLINIC_OR_DEPARTMENT_OTHER): Payer: Self-pay | Admitting: General Surgery

## 2020-10-11 ENCOUNTER — Other Ambulatory Visit: Payer: Self-pay

## 2020-10-12 ENCOUNTER — Other Ambulatory Visit: Payer: BC Managed Care – PPO

## 2020-10-16 ENCOUNTER — Other Ambulatory Visit (HOSPITAL_COMMUNITY)
Admission: RE | Admit: 2020-10-16 | Discharge: 2020-10-16 | Disposition: A | Discharge status: Home / Self Care | Payer: BC Managed Care – PPO | Source: Ambulatory Visit | Attending: General Surgery | Admitting: General Surgery

## 2020-10-16 ENCOUNTER — Other Ambulatory Visit
Admission: RE | Admit: 2020-10-16 | Discharge: 2020-10-16 | Disposition: A | Discharge status: Home / Self Care | Payer: BC Managed Care – PPO | Source: Ambulatory Visit | Attending: General Surgery | Admitting: General Surgery

## 2020-10-16 ENCOUNTER — Encounter (HOSPITAL_BASED_OUTPATIENT_CLINIC_OR_DEPARTMENT_OTHER)
Admission: RE | Admit: 2020-10-16 | Discharge: 2020-10-16 | Disposition: A | Discharge status: Home / Self Care | Payer: BC Managed Care – PPO | Source: Ambulatory Visit | Attending: General Surgery | Admitting: General Surgery

## 2020-10-16 DIAGNOSIS — Z01812 Encounter for preprocedural laboratory examination: Secondary | ICD-10-CM | POA: Insufficient documentation

## 2020-10-16 DIAGNOSIS — Z20822 Contact with and (suspected) exposure to covid-19: Secondary | ICD-10-CM | POA: Diagnosis not present

## 2020-10-16 LAB — BASIC METABOLIC PANEL
Anion gap: 10 (ref 5–15)
BUN: 14 mg/dL (ref 6–20)
CO2: 25 mmol/L (ref 22–32)
Calcium: 9.3 mg/dL (ref 8.9–10.3)
Chloride: 100 mmol/L (ref 98–111)
Creatinine, Ser: 0.75 mg/dL (ref 0.44–1.00)
GFR, Estimated: >60 mL/min (ref >60)
Glucose, Bld: 115 mg/dL — ABNORMAL HIGH (ref 70–99)
Potassium: 3.9 mmol/L (ref 3.5–5.1)
Sodium: 135 mmol/L (ref 135–145)

## 2020-10-16 LAB — SARS CORONAVIRUS 2 (TAT 6-24 HRS): SARS Coronavirus 2: NEGATIVE

## 2020-10-16 NOTE — Progress Notes (Signed)
      Enhanced Recovery after Surgery for Orthopedics Enhanced Recovery after Surgery is a protocol used to improve the stress on your body and your recovery after surgery.  Patient Instructions  . The night before surgery:  o No food after midnight. ONLY clear liquids after midnight  . The day of surgery (if you do NOT have diabetes):  o Drink ONE (1) Pre-Surgery Clear Ensure as directed.   o This drink was given to you during your hospital  pre-op appointment visit. o The pre-op nurse will instruct you on the time to drink the  Pre-Surgery Ensure depending on your surgery time. o Finish the drink at the designated time by the pre-op nurse.  o Nothing else to drink after completing the  Pre-Surgery Clear Ensure.  . The day of surgery (if you have diabetes): o Drink ONE (1) Gatorade 2 (G2) as directed. o This drink was given to you during your hospital  pre-op appointment visit.  o The pre-op nurse will instruct you on the time to drink the   Gatorade 2 (G2) depending on your surgery time. o Color of the Gatorade may vary. Red is not allowed. o Nothing else to drink after completing the  Gatorade 2 (G2).         If you have questions, please contact your surgeon's office. Surgical soap given to pt with instructions. Pt verbalizes understanding. 

## 2020-10-19 ENCOUNTER — Encounter (HOSPITAL_COMMUNITY)
Admission: RE | Admit: 2020-10-19 | Discharge: 2020-10-19 | Disposition: A | Discharge status: Home / Self Care | Payer: BC Managed Care – PPO | Source: Ambulatory Visit | Attending: General Surgery | Admitting: General Surgery

## 2020-10-19 ENCOUNTER — Encounter (HOSPITAL_BASED_OUTPATIENT_CLINIC_OR_DEPARTMENT_OTHER)
Admission: RE | Disposition: A | Discharge status: Home / Self Care | Payer: Self-pay | Source: Home / Self Care | Attending: Plastic Surgery

## 2020-10-19 ENCOUNTER — Encounter (HOSPITAL_BASED_OUTPATIENT_CLINIC_OR_DEPARTMENT_OTHER): Payer: Self-pay | Admitting: General Surgery

## 2020-10-19 ENCOUNTER — Ambulatory Visit (HOSPITAL_BASED_OUTPATIENT_CLINIC_OR_DEPARTMENT_OTHER): Payer: BC Managed Care – PPO | Admitting: Anesthesiology

## 2020-10-19 ENCOUNTER — Ambulatory Visit (HOSPITAL_BASED_OUTPATIENT_CLINIC_OR_DEPARTMENT_OTHER)
Admission: RE | Admit: 2020-10-19 | Discharge: 2020-10-20 | Disposition: A | Discharge status: Home / Self Care | Payer: BC Managed Care – PPO | Attending: Plastic Surgery | Admitting: Plastic Surgery

## 2020-10-19 ENCOUNTER — Other Ambulatory Visit: Payer: Self-pay

## 2020-10-19 DIAGNOSIS — Z800 Family history of malignant neoplasm of digestive organs: Secondary | ICD-10-CM | POA: Diagnosis not present

## 2020-10-19 DIAGNOSIS — D0512 Intraductal carcinoma in situ of left breast: Secondary | ICD-10-CM | POA: Insufficient documentation

## 2020-10-19 DIAGNOSIS — D0510 Intraductal carcinoma in situ of unspecified breast: Secondary | ICD-10-CM | POA: Diagnosis present

## 2020-10-19 HISTORY — PX: MASTECTOMY W/ SENTINEL NODE BIOPSY: SHX2001

## 2020-10-19 HISTORY — PX: BREAST RECONSTRUCTION WITH PLACEMENT OF TISSUE EXPANDER AND ALLODERM: SHX6805

## 2020-10-19 SURGERY — MASTECTOMY WITH SENTINEL LYMPH NODE BIOPSY
Anesthesia: Regional | Site: Breast | Laterality: Bilateral

## 2020-10-19 MED ORDER — ONDANSETRON HCL 4 MG/2ML IJ SOLN
INTRAMUSCULAR | Status: AC
  Filled 2020-10-19: qty 2

## 2020-10-19 MED ORDER — TIZANIDINE HCL 4 MG PO TABS
4.0000 mg | ORAL_TABLET | Freq: Three times a day (TID) | ORAL | Status: DC | PRN
  Administered 2020-10-19: 4 mg via ORAL
  Filled 2020-10-19: qty 1

## 2020-10-19 MED ORDER — CYCLOSPORINE 0.05 % OP EMUL
1.0000 [drp] | Freq: Three times a day (TID) | OPHTHALMIC | Status: DC
  Administered 2020-10-19: 1 [drp] via OPHTHALMIC
  Filled 2020-10-19: qty 1

## 2020-10-19 MED ORDER — CEFAZOLIN SODIUM-DEXTROSE 2-4 GM/100ML-% IV SOLN
INTRAVENOUS | Status: AC
  Filled 2020-10-19: qty 100

## 2020-10-19 MED ORDER — ENOXAPARIN SODIUM 40 MG/0.4ML ~~LOC~~ SOLN
40.0000 mg | SUBCUTANEOUS | Status: DC
  Administered 2020-10-20: 40 mg via SUBCUTANEOUS
  Filled 2020-10-19: qty 0.4

## 2020-10-19 MED ORDER — KETOROLAC TROMETHAMINE 30 MG/ML IJ SOLN: 30.0000 mg | Freq: Once | INTRAMUSCULAR | Status: AC | PRN

## 2020-10-19 MED ORDER — DEXAMETHASONE SODIUM PHOSPHATE 4 MG/ML IJ SOLN
INTRAMUSCULAR | Status: DC | PRN
  Administered 2020-10-19: 5 mg via INTRAVENOUS

## 2020-10-19 MED ORDER — TRIAMTERENE-HCTZ 37.5-25 MG PO CAPS
1.0000 | ORAL_CAPSULE | Freq: Every day | ORAL | Status: DC
  Administered 2020-10-19: 1 via ORAL
  Filled 2020-10-19: qty 1

## 2020-10-19 MED ORDER — ACETAMINOPHEN 500 MG PO TABS
ORAL_TABLET | ORAL | Status: AC
  Filled 2020-10-19: qty 2

## 2020-10-19 MED ORDER — BUPIVACAINE HCL (PF) 0.5 % IJ SOLN
INTRAMUSCULAR | Status: DC | PRN
  Administered 2020-10-19: 30 mL via PERINEURAL

## 2020-10-19 MED ORDER — ALBUTEROL SULFATE HFA 108 (90 BASE) MCG/ACT IN AERS: 1.0000 | INHALATION_SPRAY | RESPIRATORY_TRACT | Status: DC | PRN

## 2020-10-19 MED ORDER — SUGAMMADEX SODIUM 200 MG/2ML IV SOLN
INTRAVENOUS | Status: DC | PRN
  Administered 2020-10-19: 200 mg via INTRAVENOUS

## 2020-10-19 MED ORDER — KCL IN DEXTROSE-NACL 20-5-0.45 MEQ/L-%-% IV SOLN
INTRAVENOUS | Status: DC
  Filled 2020-10-19: qty 1000

## 2020-10-19 MED ORDER — GABAPENTIN 300 MG PO CAPS
ORAL_CAPSULE | ORAL | Status: AC
  Filled 2020-10-19: qty 1

## 2020-10-19 MED ORDER — OXYCODONE HCL 5 MG PO TABS
5.0000 mg | ORAL_TABLET | ORAL | Status: DC | PRN
  Administered 2020-10-19 (×2): 5 mg via ORAL
  Filled 2020-10-19 (×2): qty 1

## 2020-10-19 MED ORDER — DEXMEDETOMIDINE HCL 200 MCG/2ML IV SOLN
INTRAVENOUS | Status: DC | PRN
  Administered 2020-10-19: 12 ug via INTRAVENOUS
  Administered 2020-10-19: 8 ug via INTRAVENOUS

## 2020-10-19 MED ORDER — MIDAZOLAM HCL 2 MG/2ML IJ SOLN
INTRAMUSCULAR | Status: AC
  Filled 2020-10-19: qty 2

## 2020-10-19 MED ORDER — ONDANSETRON HCL 4 MG/2ML IJ SOLN: 4.0000 mg | Freq: Four times a day (QID) | INTRAMUSCULAR | Status: DC | PRN

## 2020-10-19 MED ORDER — METHYLENE BLUE 0.5 % INJ SOLN
INTRAVENOUS | Status: AC
  Filled 2020-10-19: qty 10

## 2020-10-19 MED ORDER — KETAMINE HCL 10 MG/ML IJ SOLN
INTRAMUSCULAR | Status: DC | PRN
  Administered 2020-10-19: 50 mg via INTRAVENOUS

## 2020-10-19 MED ORDER — TECHNETIUM TC 99M TILMANOCEPT KIT
1.0000 | PACK | Freq: Once | INTRAVENOUS | Status: AC | PRN
  Administered 2020-10-19: 1 via INTRADERMAL

## 2020-10-19 MED ORDER — PANTOPRAZOLE SODIUM 40 MG PO TBEC
40.0000 mg | DELAYED_RELEASE_TABLET | Freq: Two times a day (BID) | ORAL | Status: DC
  Filled 2020-10-19: qty 1

## 2020-10-19 MED ORDER — MEPERIDINE HCL 25 MG/ML IJ SOLN: 6.2500 mg | INTRAMUSCULAR | Status: DC | PRN

## 2020-10-19 MED ORDER — ONDANSETRON HCL 4 MG/2ML IJ SOLN
INTRAMUSCULAR | Status: DC | PRN
  Administered 2020-10-19: 4 mg via INTRAVENOUS

## 2020-10-19 MED ORDER — SODIUM CHLORIDE (PF) 0.9 % IJ SOLN
INTRAMUSCULAR | Status: AC
  Filled 2020-10-19: qty 10

## 2020-10-19 MED ORDER — GABAPENTIN 300 MG PO CAPS
300.0000 mg | ORAL_CAPSULE | ORAL | Status: AC
  Administered 2020-10-19: 300 mg via ORAL

## 2020-10-19 MED ORDER — CHLORHEXIDINE GLUCONATE CLOTH 2 % EX PADS: 6.0000 | MEDICATED_PAD | Freq: Once | CUTANEOUS | Status: DC

## 2020-10-19 MED ORDER — 0.9 % SODIUM CHLORIDE (POUR BTL) OPTIME
TOPICAL | Status: DC | PRN
  Administered 2020-10-19: 1000 mL

## 2020-10-19 MED ORDER — ONDANSETRON 4 MG PO TBDP: 4.0000 mg | ORAL_TABLET | Freq: Four times a day (QID) | ORAL | Status: DC | PRN

## 2020-10-19 MED ORDER — DEXAMETHASONE SODIUM PHOSPHATE 10 MG/ML IJ SOLN
INTRAMUSCULAR | Status: AC
  Filled 2020-10-19: qty 1

## 2020-10-19 MED ORDER — OXYCODONE HCL 5 MG PO TABS: 10.0000 mg | ORAL_TABLET | Freq: Once | ORAL | Status: DC | PRN

## 2020-10-19 MED ORDER — FENTANYL CITRATE (PF) 100 MCG/2ML IJ SOLN
INTRAMUSCULAR | Status: AC
  Filled 2020-10-19: qty 2

## 2020-10-19 MED ORDER — LACTATED RINGERS IV SOLN: INTRAVENOUS | Status: DC

## 2020-10-19 MED ORDER — MIDAZOLAM HCL 2 MG/2ML IJ SOLN: 2.0000 mg | Freq: Once | INTRAMUSCULAR | Status: DC

## 2020-10-19 MED ORDER — POVIDONE-IODINE 10 % EX SOLN
CUTANEOUS | Status: DC | PRN
  Administered 2020-10-19: 1 via TOPICAL

## 2020-10-19 MED ORDER — BUPIVACAINE LIPOSOME 1.3 % IJ SUSP
INTRAMUSCULAR | Status: DC | PRN
  Administered 2020-10-19: 10 mL via PERINEURAL

## 2020-10-19 MED ORDER — HYDROMORPHONE HCL 1 MG/ML IJ SOLN
INTRAMUSCULAR | Status: DC | PRN
  Administered 2020-10-19: .25 mg via INTRAVENOUS
  Administered 2020-10-19: .5 mg via INTRAVENOUS
  Administered 2020-10-19: 1 mg via INTRAVENOUS
  Administered 2020-10-19: .25 mg via INTRAVENOUS

## 2020-10-19 MED ORDER — SODIUM CHLORIDE 0.9 % IV SOLN
INTRAVENOUS | Status: DC | PRN
  Administered 2020-10-19: 40 ug/min via INTRAVENOUS

## 2020-10-19 MED ORDER — AMISULPRIDE (ANTIEMETIC) 5 MG/2ML IV SOLN: 10.0000 mg | Freq: Once | INTRAVENOUS | Status: DC | PRN

## 2020-10-19 MED ORDER — KETOROLAC TROMETHAMINE 30 MG/ML IJ SOLN
30.0000 mg | Freq: Three times a day (TID) | INTRAMUSCULAR | Status: AC
  Administered 2020-10-19 – 2020-10-20 (×3): 30 mg via INTRAVENOUS
  Filled 2020-10-19 (×4): qty 1

## 2020-10-19 MED ORDER — DULOXETINE HCL 60 MG PO CPEP
90.0000 mg | ORAL_CAPSULE | Freq: Every day | ORAL | Status: DC
  Administered 2020-10-19: 90 mg via ORAL
  Filled 2020-10-19: qty 1

## 2020-10-19 MED ORDER — SODIUM CHLORIDE 0.9 % IV SOLN
INTRAVENOUS | Status: AC
  Filled 2020-10-19: qty 10

## 2020-10-19 MED ORDER — CELECOXIB 200 MG PO CAPS
200.0000 mg | ORAL_CAPSULE | ORAL | Status: AC
  Administered 2020-10-19: 200 mg via ORAL

## 2020-10-19 MED ORDER — CEFAZOLIN SODIUM-DEXTROSE 2-4 GM/100ML-% IV SOLN
2.0000 g | INTRAVENOUS | Status: AC
  Administered 2020-10-19 (×2): 2 g via INTRAVENOUS

## 2020-10-19 MED ORDER — LIDOCAINE HCL (CARDIAC) PF 100 MG/5ML IV SOSY
PREFILLED_SYRINGE | INTRAVENOUS | Status: DC | PRN
  Administered 2020-10-19: 20 mg via INTRATRACHEAL

## 2020-10-19 MED ORDER — HYDROMORPHONE HCL 1 MG/ML IJ SOLN
1.0000 mg | INTRAMUSCULAR | Status: DC | PRN
Start: 2020-10-19 — End: 2020-10-20

## 2020-10-19 MED ORDER — PHENYLEPHRINE HCL (PRESSORS) 10 MG/ML IV SOLN
INTRAVENOUS | Status: DC | PRN
  Administered 2020-10-19 (×4): 200 ug via INTRAVENOUS
  Administered 2020-10-19: 120 ug via INTRAVENOUS
  Administered 2020-10-19 (×3): 200 ug via INTRAVENOUS

## 2020-10-19 MED ORDER — ROCURONIUM BROMIDE 100 MG/10ML IV SOLN
INTRAVENOUS | Status: DC | PRN
  Administered 2020-10-19: 100 mg via INTRAVENOUS

## 2020-10-19 MED ORDER — HYDROMORPHONE HCL 1 MG/ML IJ SOLN: 0.5000 mg | INTRAMUSCULAR | Status: DC | PRN

## 2020-10-19 MED ORDER — PROMETHAZINE HCL 25 MG/ML IJ SOLN: 6.2500 mg | INTRAMUSCULAR | Status: DC | PRN

## 2020-10-19 MED ORDER — PROPOFOL 500 MG/50ML IV EMUL
INTRAVENOUS | Status: DC | PRN
  Administered 2020-10-19: 25 ug/kg/min via INTRAVENOUS

## 2020-10-19 MED ORDER — DIPHENHYDRAMINE HCL 50 MG/ML IJ SOLN
INTRAMUSCULAR | Status: AC
  Filled 2020-10-19: qty 1

## 2020-10-19 MED ORDER — SODIUM CHLORIDE 0.9 % IV SOLN
INTRAVENOUS | Status: DC | PRN
  Administered 2020-10-19: 500 mL

## 2020-10-19 MED ORDER — FENTANYL CITRATE (PF) 100 MCG/2ML IJ SOLN
INTRAMUSCULAR | Status: DC | PRN
  Administered 2020-10-19: 100 ug via INTRAVENOUS

## 2020-10-19 MED ORDER — ACETAMINOPHEN 500 MG PO TABS
1000.0000 mg | ORAL_TABLET | ORAL | Status: AC
  Administered 2020-10-19: 1000 mg via ORAL
  Filled 2020-10-19: qty 2

## 2020-10-19 MED ORDER — SCOPOLAMINE 1 MG/3DAYS TD PT72
1.0000 | MEDICATED_PATCH | TRANSDERMAL | Status: DC
  Administered 2020-10-19: 1.5 mg via TRANSDERMAL

## 2020-10-19 MED ORDER — SCOPOLAMINE 1 MG/3DAYS TD PT72
MEDICATED_PATCH | TRANSDERMAL | Status: AC
  Filled 2020-10-19: qty 1

## 2020-10-19 MED ORDER — PROPOFOL 10 MG/ML IV BOLUS
INTRAVENOUS | Status: AC
  Filled 2020-10-19: qty 40

## 2020-10-19 MED ORDER — LIDOCAINE 2% (20 MG/ML) 5 ML SYRINGE
INTRAMUSCULAR | Status: AC
  Filled 2020-10-19: qty 5

## 2020-10-19 MED ORDER — HYDROMORPHONE HCL 1 MG/ML IJ SOLN
INTRAMUSCULAR | Status: AC
  Filled 2020-10-19: qty 3

## 2020-10-19 MED ORDER — SUVOREXANT 20 MG PO TABS: 20.0000 mg | ORAL_TABLET | Freq: Every evening | ORAL | Status: DC | PRN

## 2020-10-19 MED ORDER — ACETAMINOPHEN 500 MG PO TABS
1000.0000 mg | ORAL_TABLET | Freq: Once | ORAL | Status: AC
  Administered 2020-10-19: 1000 mg via ORAL

## 2020-10-19 MED ORDER — MIDAZOLAM HCL 2 MG/2ML IJ SOLN
4.0000 mg | Freq: Once | INTRAMUSCULAR | Status: AC
  Administered 2020-10-19: 4 mg via INTRAVENOUS

## 2020-10-19 MED ORDER — FENTANYL CITRATE (PF) 100 MCG/2ML IJ SOLN
200.0000 ug | Freq: Once | INTRAMUSCULAR | Status: AC
Start: 2020-10-19 — End: 2020-10-19
  Administered 2020-10-19: 200 ug via INTRAVENOUS

## 2020-10-19 MED ORDER — TAPENTADOL HCL 50 MG PO TABS
50.0000 mg | ORAL_TABLET | ORAL | Status: DC | PRN
  Administered 2020-10-19 – 2020-10-20 (×2): 50 mg via ORAL
  Filled 2020-10-19 (×2): qty 1

## 2020-10-19 MED ORDER — FENTANYL CITRATE (PF) 100 MCG/2ML IJ SOLN: 100.0000 ug | Freq: Once | INTRAMUSCULAR | Status: DC

## 2020-10-19 MED ORDER — DOXEPIN HCL 10 MG PO CAPS
30.0000 mg | ORAL_CAPSULE | Freq: Every day | ORAL | Status: DC
  Administered 2020-10-19: 30 mg via ORAL
  Filled 2020-10-19: qty 3

## 2020-10-19 MED ORDER — CELECOXIB 200 MG PO CAPS
ORAL_CAPSULE | ORAL | Status: AC
  Filled 2020-10-19: qty 1

## 2020-10-19 MED ORDER — LOSARTAN POTASSIUM 50 MG PO TABS
50.0000 mg | ORAL_TABLET | Freq: Every day | ORAL | Status: DC
  Filled 2020-10-19 (×2): qty 1

## 2020-10-19 MED ORDER — SODIUM CHLORIDE 0.9 % IV SOLN
INTRAVENOUS | Status: DC | PRN
  Administered 2020-10-19: 2.5 ug/kg/min via INTRAVENOUS

## 2020-10-19 MED ORDER — LACTATED RINGERS IV SOLN: INTRAVENOUS | Status: DC | PRN

## 2020-10-19 MED ORDER — CEFAZOLIN SODIUM 1 G IJ SOLR
INTRAMUSCULAR | Status: AC
  Filled 2020-10-19: qty 10

## 2020-10-19 MED ORDER — PROPOFOL 10 MG/ML IV BOLUS
INTRAVENOUS | Status: DC | PRN
  Administered 2020-10-19: 200 mg via INTRAVENOUS

## 2020-10-19 MED ORDER — CEFAZOLIN SODIUM-DEXTROSE 1-4 GM/50ML-% IV SOLN
1.0000 g | Freq: Three times a day (TID) | INTRAVENOUS | Status: DC
  Administered 2020-10-19 – 2020-10-20 (×2): 1 g via INTRAVENOUS
  Filled 2020-10-19 (×3): qty 50

## 2020-10-19 MED ORDER — KETAMINE HCL 100 MG/ML IJ SOLN
INTRAMUSCULAR | Status: AC
  Filled 2020-10-19: qty 1

## 2020-10-19 SURGICAL SUPPLY — 87 items
ADH SKN CLS APL DERMABOND .7 (GAUZE/BANDAGES/DRESSINGS) ×2
ALLOGRAFT PERF 16X20 1.6+/-0.4 (Tissue) ×2 IMPLANT
APL PRP STRL LF DISP 70% ISPRP (MISCELLANEOUS) ×2
APPLIER CLIP 9.375 MED OPEN (MISCELLANEOUS) ×6
APR CLP MED 9.3 20 MLT OPN (MISCELLANEOUS) ×3
BAG DECANTER FOR FLEXI CONT (MISCELLANEOUS) ×2 IMPLANT
BINDER BREAST LRG (GAUZE/BANDAGES/DRESSINGS) IMPLANT
BINDER BREAST MEDIUM (GAUZE/BANDAGES/DRESSINGS) IMPLANT
BINDER BREAST XLRG (GAUZE/BANDAGES/DRESSINGS) IMPLANT
BINDER BREAST XXLRG (GAUZE/BANDAGES/DRESSINGS) IMPLANT
BIOPATCH RED 1 DISK 7.0 (GAUZE/BANDAGES/DRESSINGS) IMPLANT
BLADE SURG 10 STRL SS (BLADE) ×3 IMPLANT
BLADE SURG 15 STRL LF DISP TIS (BLADE) ×1 IMPLANT
BLADE SURG 15 STRL SS (BLADE) ×2
BNDG GAUZE ELAST 4 BULKY (GAUZE/BANDAGES/DRESSINGS) ×1 IMPLANT
CANISTER SUCT 1200ML W/VALVE (MISCELLANEOUS) ×3 IMPLANT
CHLORAPREP W/TINT 26 (MISCELLANEOUS) ×3 IMPLANT
CLIP APPLIE 9.375 MED OPEN (MISCELLANEOUS) ×1 IMPLANT
COUNTER NEEDLE 1200 MAGNETIC (NEEDLE) IMPLANT
COVER BACK TABLE 60X90IN (DRAPES) ×2 IMPLANT
COVER MAYO STAND STRL (DRAPES) ×4 IMPLANT
COVER PROBE W GEL 5X96 (DRAPES) ×1 IMPLANT
COVER WAND RF STERILE (DRAPES) IMPLANT
DECANTER SPIKE VIAL GLASS SM (MISCELLANEOUS) IMPLANT
DERMABOND ADVANCED (GAUZE/BANDAGES/DRESSINGS) ×2
DERMABOND ADVANCED .7 DNX12 (GAUZE/BANDAGES/DRESSINGS) ×1 IMPLANT
DEVICE DISSECT PLASMABLAD 3.0S (MISCELLANEOUS) ×1 IMPLANT
DEVICE DSSCT PLSMBLD 3.0S LGHT (MISCELLANEOUS) IMPLANT
DRAIN CHANNEL 15F RND FF W/TCR (WOUND CARE) ×2 IMPLANT
DRAIN CHANNEL 19F RND (DRAIN) ×3 IMPLANT
DRAPE INCISE IOBAN 66X45 STRL (DRAPES) ×2 IMPLANT
DRAPE LAPAROSCOPIC ABDOMINAL (DRAPES) ×1 IMPLANT
DRAPE TOP ARMCOVERS (MISCELLANEOUS) ×2 IMPLANT
DRAPE U-SHAPE 76X120 STRL (DRAPES) ×2 IMPLANT
DRAPE UTILITY XL STRL (DRAPES) ×3 IMPLANT
DRSG PAD ABDOMINAL 8X10 ST (GAUZE/BANDAGES/DRESSINGS) ×5 IMPLANT
DRSG TEGADERM 4X10 (GAUZE/BANDAGES/DRESSINGS) ×5 IMPLANT
ELECT BLADE 4.0 EZ CLEAN MEGAD (MISCELLANEOUS) ×2
ELECT COATED BLADE 2.86 ST (ELECTRODE) ×2 IMPLANT
ELECT REM PT RETURN 9FT ADLT (ELECTROSURGICAL) ×2
ELECTRODE BLDE 4.0 EZ CLN MEGD (MISCELLANEOUS) ×1 IMPLANT
ELECTRODE REM PT RTRN 9FT ADLT (ELECTROSURGICAL) ×1 IMPLANT
EVACUATOR SILICONE 100CC (DRAIN) ×5 IMPLANT
EXPANDER TISSUE FV FOURTE 500 (Prosthesis & Implant Plastic) IMPLANT
GAUZE SPONGE 4X4 12PLY STRL LF (GAUZE/BANDAGES/DRESSINGS) ×2 IMPLANT
GLOVE SURG ENC MOIS LTX SZ6 (GLOVE) ×4 IMPLANT
GLOVE SURG ENC MOIS LTX SZ7.5 (GLOVE) ×2 IMPLANT
GOWN STRL REUS W/ TWL LRG LVL3 (GOWN DISPOSABLE) ×4 IMPLANT
GOWN STRL REUS W/TWL LRG LVL3 (GOWN DISPOSABLE) ×8
ILLUMINATOR WAVEGUIDE N/F (MISCELLANEOUS) IMPLANT
KIT FILL SYSTEM UNIVERSAL (SET/KITS/TRAYS/PACK) IMPLANT
LIGHT WAVEGUIDE WIDE FLAT (MISCELLANEOUS) IMPLANT
MARKER SKIN DUAL TIP RULER LAB (MISCELLANEOUS) IMPLANT
NDL HYPO 25X1 1.5 SAFETY (NEEDLE) ×1 IMPLANT
NEEDLE HYPO 25X1 1.5 SAFETY (NEEDLE) ×2 IMPLANT
NS IRRIG 1000ML POUR BTL (IV SOLUTION) ×2 IMPLANT
PACK BASIN DAY SURGERY FS (CUSTOM PROCEDURE TRAY) ×2 IMPLANT
PENCIL SMOKE EVACUATOR (MISCELLANEOUS) ×1 IMPLANT
PIN SAFETY STERILE (MISCELLANEOUS) ×2 IMPLANT
PLASMABLADE 3.0S (MISCELLANEOUS)
PLASMABLADE 3.0S W/LIGHT (MISCELLANEOUS) ×2
PUNCH BIOPSY DERMAL 4MM (MISCELLANEOUS) IMPLANT
SHEET MEDIUM DRAPE 40X70 STRL (DRAPES) ×3 IMPLANT
SLEEVE SCD COMPRESS KNEE MED (MISCELLANEOUS) ×2 IMPLANT
SPONGE LAP 18X18 RF (DISPOSABLE) ×7 IMPLANT
STAPLER VISISTAT 35W (STAPLE) ×2 IMPLANT
SUT CHROMIC 4 0 PS 2 18 (SUTURE) ×5 IMPLANT
SUT ETHILON 2 0 FS 18 (SUTURE) ×3 IMPLANT
SUT MNCRL AB 4-0 PS2 18 (SUTURE) ×5 IMPLANT
SUT SILK 2 0 SH (SUTURE) IMPLANT
SUT VIC AB 3-0 SH 27 (SUTURE) ×4
SUT VIC AB 3-0 SH 27X BRD (SUTURE) IMPLANT
SUT VICRYL 0 CT-2 (SUTURE) ×5 IMPLANT
SUT VICRYL 3-0 CR8 SH (SUTURE) ×2 IMPLANT
SUT VICRYL 4-0 PS2 18IN ABS (SUTURE) ×2 IMPLANT
SUT VLOC 180 0 24IN GS25 (SUTURE) ×2 IMPLANT
SYR 50ML LL SCALE MARK (SYRINGE) ×2 IMPLANT
SYR BULB IRRIG 60ML STRL (SYRINGE) ×3 IMPLANT
SYR CONTROL 10ML LL (SYRINGE) ×1 IMPLANT
TAPE MEASURE VINYL STERILE (MISCELLANEOUS) IMPLANT
TISSUE EXPNDR FV FOURTE 500 (Prosthesis & Implant Plastic) ×4 IMPLANT
TOWEL GREEN STERILE FF (TOWEL DISPOSABLE) ×2 IMPLANT
TRAY DSU PREP LF (CUSTOM PROCEDURE TRAY) ×2 IMPLANT
TRAY FOLEY W/BAG SLVR 14FR LF (SET/KITS/TRAYS/PACK) ×1 IMPLANT
TUBE CONNECTING 20X1/4 (TUBING) ×3 IMPLANT
UNDERPAD 30X36 HEAVY ABSORB (UNDERPADS AND DIAPERS) ×4 IMPLANT
YANKAUER SUCT BULB TIP NO VENT (SUCTIONS) ×2 IMPLANT

## 2020-10-19 NOTE — Anesthesia Procedure Notes (Signed)
Anesthesia Regional Block: Pectoralis block   Pre-Anesthetic Checklist: ,, timeout performed, Correct Patient, Correct Site, Correct Laterality, Correct Procedure, Correct Position, site marked, Risks and benefits discussed,  Surgical consent,  Pre-op evaluation,  At surgeon's request and post-op pain management  Laterality: Left and Right  Prep: Maximum Sterile Barrier Precautions used, chloraprep       Needles:  Injection technique: Single-shot  Needle Type: Echogenic Stimulator Needle     Needle Length: 9cm  Needle Gauge: 22     Additional Needles:   Procedures:,,,, ultrasound used (permanent image in chart),,,,  Narrative:  Start time: 10/19/2020 7:50 AM End time: 10/19/2020 8:00 AM Injection made incrementally with aspirations every 5 mL.  Performed by: Personally  Anesthesiologist: Pervis Hocking, DO  Additional Notes: Monitors applied. No increased pain on injection. No increased resistance to injection. Injection made in 5cc increments. Good needle visualization. Patient tolerated procedure well.

## 2020-10-19 NOTE — Transfer of Care (Signed)
Immediate Anesthesia Transfer of Care Note  Patient: BLIA TOTMAN  Procedure(s) Performed: BILATERAL MASTECTOMY WITH LEFT SENTINEL LYMPH NODE BIOPSY (Bilateral Breast) BILATERAL BREAST RECONSTRUCTION WITH PLACEMENT OF TISSUE EXPANDER AND ALLODERM (Bilateral Breast)  Patient Location: PACU  Anesthesia Type:General and Regional  Level of Consciousness: awake, alert  and oriented  Airway & Oxygen Therapy: Patient Spontanous Breathing and Patient connected to face mask oxygen  Post-op Assessment: Report given to RN and Post -op Vital signs reviewed and stable  Post vital signs: Reviewed and stable  Last Vitals:  Vitals Value Taken Time  BP 149/81 10/19/20 1400  Temp    Pulse 107 10/19/20 1402  Resp 11 10/19/20 1402  SpO2 95 % 10/19/20 1402  Vitals shown include unvalidated device data.  Last Pain:  Vitals:   10/19/20 0655  TempSrc: Oral  PainSc: 7          Complications: No complications documented.

## 2020-10-19 NOTE — H&P (Signed)
Melissa Allen  Location: [facility] Patient #: [id number] DOB: 1965/11/15 Married / Language: English / Race: White Female   History of Present Illness  The patient is a 55 year old female who presents with breast cancer. We are asked to see the patient in consultation by Dr. Crissie Reese to evaluate her for a new left breast cancer. The patient is a 55 year old white female who recently went for a routine screening mammogram. At that time she was found to have diffuse calcifications throughout the left breast. 2 representative areas were biopsied and both came back as ductal carcinoma in situ that was ER and PR positive. She does have a history of bilateral implants that were textured saline and placed in the prepectoral space. She has already been referred to Dr. Iran Planas for consideration of reconstruction. She also reports chronic pain from multiple spine fusions and has a dedicated pain doctor to manage her pain medicines.   Past Surgical History Appendectomy  Breast Augmentation  Left. Breast Biopsy  Left. Cataract Surgery  Left. Gallbladder Surgery - Open  Hysterectomy (not due to cancer) - Partial  Shoulder Surgery  Left. Spinal Surgery - Lower Back  Spinal Surgery - Neck  Tonsillectomy   Diagnostic Studies History  Colonoscopy  within last year Mammogram  within last year Pap Smear  1-5 years ago  Allergies  Chloroquine Phosphate *ANTIMALARIALS*  Clioquinol *DERMATOLOGICALS*  Latex  CODEINE  HYDROCODONE  Adhesive Tape  Allergies Reconciled   Medication History  Nucynta (50MG  Tablet, Oral) Active. Albuterol Sulfate (0.63MG /3ML Nebulized Soln, Inhalation) Active. DULoxetine HCl (60MG  Capsule DR Part, Oral) Active. Estradiol (1MG  Tablet, Oral) Active. Fluticasone Propionate (50MCG/ACT Suspension, Nasal) Active. Losartan Potassium (50MG  Tablet, Oral) Active. Olopatadine HCl (0.2% Solution, Ophthalmic)  Active. Pantoprazole Sodium (40MG  Tablet DR, Oral) Active. Restasis (0.05% Emulsion, Ophthalmic) Active. Triamterene-HCTZ (37.5-25MG  Tablet, Oral) Active. Belsomra (20MG  Tablet, Oral) Active. Medications Reconciled  Social History  Alcohol use  Occasional alcohol use. Caffeine use  Carbonated beverages, Tea. No drug use  Tobacco use  Never smoker.  Family History Arthritis  Father. Breast Cancer  Family Members In General, Mother. Cervical Cancer  Family Members In [place]  Father. Colon Polyps  Father. Diabetes Mellitus  Mother. Heart Disease  Mother. Malignant Neoplasm Of Pancreas  Mother. Ovarian Cancer  Family Members In General.  Pregnancy / Birth History Age at menarche  24 years. Contraceptive History  Oral contraceptives. Gravida  3 Length (months) of breastfeeding  3-6 Maternal age  47-20 Para  3  Other Problems  Anxiety Disorder  Arthritis  Asthma  Back Pain  Breast Cancer  Gastroesophageal Reflux Disease  High blood pressure  Pancreatitis     Review of Systems  General Not Present- Appetite Loss, Chills, Fatigue, Fever, Night Sweats, Weight Gain and Weight Loss. Skin Not Present- Change in Wart/Mole, Dryness, Hives, Jaundice, New Lesions, Non-Healing Wounds, Rash and Ulcer. HEENT Present- Seasonal Allergies. Not Present- Earache, Hearing Loss, Hoarseness, Nose Bleed, Oral Ulcers, Ringing in the Ears, Sinus Pain, Sore Throat, Visual Disturbances, Wears glasses/contact lenses and Yellow Eyes. Respiratory Not Present- Bloody sputum, Chronic Cough, Difficulty Breathing, Snoring and Wheezing. Breast Not Present- Breast Mass, Breast Pain, Nipple Discharge and Skin Changes. Cardiovascular Present- Rapid Heart Rate. Not Present- Chest Pain, Difficulty Breathing Lying Down, Leg Cramps, Palpitations, Shortness of Breath and Swelling of Extremities. Gastrointestinal Not Present- Abdominal Pain, Bloating, Bloody Stool,  Change in Bowel Habits, Chronic diarrhea, Constipation, Difficulty Swallowing, Excessive gas, Gets full quickly at meals,  Hemorrhoids, Indigestion, Nausea, Rectal Pain and Vomiting. Female Genitourinary Not Present- Frequency, Nocturia, Painful Urination, Pelvic Pain and Urgency. Musculoskeletal Present- Back Pain, Joint Pain, Joint Stiffness, Muscle Pain, Muscle Weakness and Swelling of Extremities. Neurological Present- Tingling, Trouble walking and Weakness. Not Present- Decreased Memory, Fainting, Headaches, Numbness, Seizures and Tremor. Psychiatric Present- Anxiety. Not Present- Bipolar, Change in Sleep Pattern, Depression, Fearful and Frequent crying. Endocrine Present- Cold Intolerance. Not Present- Excessive Hunger, Hair Changes, Heat Intolerance, Hot flashes and New Diabetes.  Vitals  Weight: 190 lb Height: 63in Body Surface Area: 1.89 m Body Mass Index: 33.66 kg/m  Temp.: 14F  Pulse: 114 (Regular)  BP: 146/82(Sitting, Left Arm, Standard)       Physical Exam  General Mental Status-Alert. General Appearance-Consistent with stated age. Hydration-Well hydrated. Voice-Normal.  Head and Neck Head-normocephalic, atraumatic with no lesions or palpable masses. Trachea-midline. Thyroid Gland Characteristics - normal size and consistency.  Eye Eyeball - Bilateral-Extraocular movements intact. Sclera/Conjunctiva - Bilateral-No scleral icterus.  Chest and Lung Exam Chest and lung exam reveals -quiet, even and easy respiratory effort with no use of accessory muscles and on auscultation, normal breath sounds, no adventitious sounds and normal vocal resonance. Inspection Chest Wall - Normal. Back - normal.  Breast Note: There is no palpable mass in either breast. There is no palpable axillary, supraclavicular, or cervical lymphadenopathy.   Cardiovascular Cardiovascular examination reveals -normal heart sounds, regular rate and rhythm with  no murmurs and normal pedal pulses bilaterally.  Abdomen Inspection Inspection of the abdomen reveals - No Hernias. Skin - Scar - no surgical scars. Palpation/Percussion Palpation and Percussion of the abdomen reveal - Soft, Non Tender, No Rebound tenderness, No Rigidity (guarding) and No hepatosplenomegaly. Auscultation Auscultation of the abdomen reveals - Bowel sounds normal.  Neurologic Neurologic evaluation reveals -alert and oriented x 3 with no impairment of recent or remote memory. Mental Status-Normal.  Musculoskeletal Normal Exam - Left-Upper Extremity Strength Normal and Lower Extremity Strength Normal. Normal Exam - Right-Upper Extremity Strength Normal and Lower Extremity Strength Normal.  Lymphatic Head & Neck  General Head & Neck Lymphatics: Bilateral - Description - Normal. Axillary  General Axillary Region: Bilateral - Description - Normal. Tenderness - Non Tender. Femoral & Inguinal  Generalized Femoral & Inguinal Lymphatics: Bilateral - Description - Normal. Tenderness - Non Tender.    Assessment & Plan  DUCTAL CARCINOMA IN SITU (DCIS) OF LEFT BREAST (D05.12) Impression: The patient appears to have a large area of ductal carcinoma in situ encompassing all 4 quadrants of the left breast. Because of the extent of disease I would recommend that she consider mastectomy. With her family history she strongly desires bilateral mastectomies with reconstruction. She is already set up to see Dr. Iran Planas to consider reconstruction. She would be a candidate for left sided sentinel node biopsy at the same time as the surgery. I have discussed with her in detail the risks and benefits of the operation as well as some of the technical aspects and she understands and wishes to proceed. We will coordinate with plastic surgery for this. She also has a pain medicine doctor who will likely have to manage her pain medicines postoperatively. This patient encounter took 60  minutes today to perform the following: take history, perform exam, review outside records, interpret imaging, counsel the patient on their diagnosis and document encounter, findings & plan in the EHR

## 2020-10-19 NOTE — Op Note (Addendum)
10/19/2020  11:18 AM  PATIENT:  Melissa Allen  55 y.o. female  PRE-OPERATIVE DIAGNOSIS:  LEFT BREAST DUCTAL CARCINOMA IN SITU  POST-OPERATIVE DIAGNOSIS:  LEFT BREAST DUCTAL CARCINOMA IN SITU  PROCEDURE:  Procedure(s): LEFT MASTECTOMY WITH DEEP LEFT AXILLARY SENTINEL LYMPH NODE BIOPSY  RIGHT PROPHYLACTIC MASTECTOMY  SURGEON:  Surgeon(s) and Role: Panel 1:    * Jovita Kussmaul, MD - Primary  PHYSICIAN ASSISTANT:   ASSISTANTS: Dr. Iran Planas   ANESTHESIA:   general  EBL:  150 mL   BLOOD ADMINISTERED:none  DRAINS: none   LOCAL MEDICATIONS USED:  NONE  SPECIMEN:  Source of Specimen:  right mastectomy, left mastectomy with sentinel node  DISPOSITION OF SPECIMEN:  PATHOLOGY  COUNTS:  YES  TOURNIQUET:  * No tourniquets in log *  DICTATION: .Dragon Dictation   After informed consent was obtained the patient was brought to the operating room and placed in the supine position on the operating table.  After adequate induction of general anesthesia the patient's bilateral chest, breast, and axillary areas were prepped with ChloraPrep, allowed to dry, and draped in usual sterile manner.  An appropriate timeout was performed.  Earlier in the day the patient underwent injection of 1 mCi of technetium sulfur colloid in the subareolar position on the left.  The neoprobe was set to technetium and a good signal was identified in the left axilla.  Attention was first turned to the right breast a triangular incision was mapped out by the Psychiatric nurse.  The incision was made along the triangle with a 10 blade knife.  Skin hooks were then used to elevate the skin flaps anteriorly towards the ceiling.  Thin skin flaps were then created circumferentially by dissecting between the breast tissue and the subcutaneous fat and skin.  This dissection was carried all the way to the chest wall circumferentially.  Next the breast was removed from the pectoralis muscle with the pectoralis fascia.  Laterally  several small vessels and intercostal brachial nerves were controlled with clips.  Once the breast was removed from the patient it was then oriented with a stitch on the lower lateral skin and sent to pathology for further evaluation.  Hemostasis was achieved using the PlasmaBlade.  The wound was then packed with moistened lap sponge and covered with a sterile green towel.  Attention was then turned to the left breast.  A similar triangular incision was then made with a 10 blade knife.  The incision was carried through the skin and subcutaneous tissue sharply with the PlasmaBlade.  Breast and skin hooks were used to elevate the skin flaps anteriorly towards the ceiling.  Thin skin flaps were then created by dissecting between the breast tissue and the subcutaneous fat.  This dissection was carried all the way to the chest wall circumferentially.  Next the breast was removed from the pectoralis muscle with the pectoralis fascia.  Laterally several small vessels and intercostal brachial nerves were controlled with clips.  Once this dissection was completed then the entire left breast was removed.  It was oriented with a stitch on the lower lateral skin and sent to pathology for further evaluation.  The neoprobe was used to examine the deep left axillary space.  I was able to identify a hot lymph node.  It was excised sharply with the PlasmaBlade and the surrounding small vessels and lymphatics were controlled with clips.  Ex vivo counts on this node were approximately 800.  No other hot or palpable nodes were  identified in the left axilla.  Hemostasis was achieved using the PlasmaBlade.  The wound wound was then packed with a moistened lap sponge and covered with a sterile green towel.  The patient tolerated the procedure well.  At the end of this portion of the case all needle sponge and instrument counts were correct.  The operation was then turned over to Dr. Iran Planas for the reconstruction portion of the case.   Her portion will be dictated separately.  PLAN OF CARE: Admit for overnight observation  PATIENT DISPOSITION:  PACU - hemodynamically stable.   Delay start of Pharmacological VTE agent (>24hrs) due to surgical blood loss or risk of bleeding: no

## 2020-10-19 NOTE — Op Note (Signed)
Operative Note   DATE OF OPERATION: 2.11.22  LOCATION: [facility]-observation  SURGICAL DIVISION: Plastic Surgery  PREOPERATIVE DIAGNOSES:  1. Left breast DCIS  POSTOPERATIVE DIAGNOSES:  same  PROCEDURE:  1. Bilateral breast reconstruction with tissue expanders 2. Acellular dermis (Alloderm) to bilateral chest 600 cm2  SURGEON: Irene Limbo MD MBA  ASSISTANT: none  ANESTHESIA:  General.   EBL: 200 ml for entire procedure  COMPLICATIONS: None immediate.   INDICATIONS FOR PROCEDURE:  The patient, Melissa Allen, is a 55 y.o. female born on [birth year], is here for bilateral prepectoral tissue expander based reconstruction following skin reduction pattern mastectomies. Patient has a history of ruptured saline augmentation subglandular.   FINDINGS: Natrelle 133S FV-13-T 500 ml tissue expanders placed bilateral, initial fill volume 300 ml air bilateral. RIGHT SN [id number] LEFT SN [id number]  DESCRIPTION OF PROCEDURE:  The patient was marked with the patient in the preoperative area to mark sternal notch, chest midline, anterior axillary lines and inframammary folds. Patient was marked for skin reduction mastectomy with most superior portion nipple areola marked on breast meridian. Vertical limbs marked by breast displacement and set at9cm length.The patient was taken to the operating room. SCDs were placed and IV antibiotics were given. Foley catheter placed. The patient's operative site was prepped and draped in a sterile fashion. A time out was performed and all information was confirmed to be correct. I assisted in mastectomies in retraction and exposure. Following completion of mastectomies, reconstruction began onrightside. In supine position, the lateral limbs for resection marked and area over lower pole preserved as inferiorly based dermal pedicle. Skin de epithelialized in this area.  The cavity was irrigated with saline followed by solution containingAncef,  gentamicin, andbetadine. Hemostasis was ensured. A 19 Fr drain was placed in subcutaneous position laterally anda 15 Fr drain placed along inframammary fold. Eachsecured to skin with 2-0 nylon. The tissue expanderswere prepared on back table prior in insertion. The expander was filled with air.Perforated acellular dermis was draped over anterior surface expander. The ADM was then secured to itself over posterior surface of expanderwith 4-0 chromic. Redundant folds acellular dermis excised so that the ADM layflat without folds over air filled expander.The expander was secured to medial insertion pectoralis with a 0 vicryl.The superior and lateral tabs also secured to pectoralis muscle with 0-vicryl. The ADM was secured to pectoralis muscle and chest wall along inferior border at inframammary fold with 0 v lock suture.Laterally the mastectomy flap over posterior axillary line was advanced anteriorly and the subcutaneous tissue and superficial fascia was secured tochest wallwith 0-vicryl. The inferiorly based dermal pedicle was redraped superiorly over expander and acellular dermis and secured to pectoralis with interrupted 0-vicryl. Skin closure completedwith 3-0 vicryl in fascial layer and 4-0 vicryl in dermis. Skin closure completed with 4-0 monocryl subcuticular and tissue adhesive.  I then directed my attention toleftchest where similar irrigation and drain placement completed. The prepared expander with ADM secured over anterior surface was placed inleft chest and tabs secured to chest wall and pectoralis muscle with 0- vicryl suture. The acellular dermis at inframammary fold was secured to chest wall with 0 V-lock suture.Laterally the mastectomy flap over posterior axillary line was advanced anteriorly and the subcutaneous tissue and superficial fascia was secured tochest wallwith 0-vicryl. The inferiorly based dermal pedicle was redraped superiorly over expander and acellular dermis and  secured to pectoralis with interrupted 0-vicryl. Skin closure completedwith 3-0 vicryl in fascial layer and 4-0 vicryl in dermis. Skin closure completed with 4-0 monocryl  subcuticular and tissue adhesive.Tegaderms applied bilateral, followed by dry dressing and breast binder.  The patient was allowed to wake from anesthesia, extubated and taken to the recovery room in satisfactory condition.   SPECIMENS: none  DRAINS: 15 and 19 Fr JP in right and left breast reconstruction

## 2020-10-19 NOTE — Anesthesia Preprocedure Evaluation (Addendum)
Anesthesia Evaluation  Patient identified by MRN, date of birth, ID band Patient awake    Reviewed: Allergy & Precautions, NPO status , Patient's Chart, lab work & pertinent test results  History of Anesthesia Complications (+) PONV and history of anesthetic complications (difficult IV access, no PONV with last two anesthetics)  Airway Mallampati: III  TM Distance: >3 FB Neck ROM: Full    Dental no notable dental hx. (+) Teeth Intact, Dental Advisory Given   Pulmonary asthma , COPD,    Pulmonary exam normal breath sounds clear to auscultation       Cardiovascular hypertension, Pt. on medications Normal cardiovascular exam Rhythm:Regular Rate:Normal  Echo 2020: normal LVEF and valves 1. Left ventricular ejection fraction, by visual estimation, is 55 to  60%. The left ventricle has normal function. There is no left ventricular  hypertrophy.  2. Left ventricular diastolic parameters are indeterminate.  3. The left ventricle has no regional wall motion abnormalities.  4. Global right ventricle has normal systolic function.The right  ventricular size is normal. No increase in right ventricular wall  thickness.  5. Left atrial size was normal.  6. Right atrial size was normal.  7. Presence of pericardial fat pad.  8. Trivial pericardial effusion is present.  9. Mild mitral annular calcification.  10. The mitral valve is grossly normal. Trace mitral valve regurgitation.  11. The tricuspid valve is grossly normal. Tricuspid valve regurgitation  is trivial.  12. The aortic valve is tricuspid. Aortic valve regurgitation is not  visualized. No evidence of aortic valve sclerosis or stenosis.  13. The pulmonic valve was grossly normal. Pulmonic valve regurgitation is  not visualized.  14. Normal pulmonary artery systolic pressure.  15. The tricuspid regurgitant velocity is 2.19 m/s, and with an assumed  right atrial pressure  of 8 mmHg, the estimated right ventricular systolic  pressure is normal at 27.2 mmHg.  16. The inferior vena cava is normal in size with <50% respiratory  variability, suggesting right atrial pressure of 8 mmHg.    Neuro/Psych  Headaches, Seizures -, Well Controlled,  PSYCHIATRIC DISORDERS Anxiety Depression    GI/Hepatic Neg liver ROS, GERD  Medicated and Controlled,  Endo/Other  Obesity BMI 35  Renal/GU negative Renal ROS  negative genitourinary   Musculoskeletal  (+) Arthritis , Osteoarthritis,  Fibromyalgia - (tapentadol), narcotic dependent  Abdominal (+) + obese,   Peds  Hematology negative hematology ROS (+)   Anesthesia Other Findings L breast DCIS  Reproductive/Obstetrics negative OB ROS                           Anesthesia Physical Anesthesia Plan  ASA: III  Anesthesia Plan: General and Regional   Post-op Pain Management: GA combined w/ Regional for post-op pain   Induction: Intravenous  PONV Risk Score and Plan: 4 or greater and Ondansetron, Dexamethasone, Midazolam, Scopolamine patch - Pre-op, Propofol infusion and Treatment may vary due to age or medical condition  Airway Management Planned: Oral ETT  Additional Equipment: None  Intra-op Plan:   Post-operative Plan: Extubation in OR  Informed Consent: I have reviewed the patients History and Physical, chart, labs and discussed the procedure including the risks, benefits and alternatives for the proposed anesthesia with the patient or authorized representative who has indicated his/her understanding and acceptance.     Dental advisory given  Plan Discussed with: CRNA  Anesthesia Plan Comments: (Chronic pain, very high tolerance to pain meds- takes tapentadol. Pain management  includes B/L pec blocks, preoperative non-narcotic multimodals, ketamine and narcotics intraoperatively.)       Anesthesia Quick Evaluation

## 2020-10-19 NOTE — Anesthesia Procedure Notes (Signed)
Procedure Name: Intubation Performed by: Verita Lamb, CRNA Pre-anesthesia Checklist: Patient identified, Emergency Drugs available, Suction available and Patient being monitored Patient Re-evaluated:Patient Re-evaluated prior to induction Oxygen Delivery Method: Circle system utilized Preoxygenation: Pre-oxygenation with 100% oxygen Induction Type: IV induction Ventilation: Mask ventilation without difficulty Laryngoscope Size: Mac, 3 and Glidescope Grade View: Grade II Tube type: Oral Tube size: 7.0 mm Number of attempts: 1 Airway Equipment and Method: Stylet,  Oral airway,  Video-laryngoscopy and Rigid stylet Placement Confirmation: ETT inserted through vocal cords under direct vision,  positive ETCO2,  breath sounds checked- equal and bilateral and CO2 detector Secured at: 22 cm Tube secured with: Tape Dental Injury: Teeth and Oropharynx as per pre-operative assessment  Difficulty Due To: Difficulty was anticipated and Difficult Airway- due to reduced neck mobility Comments: Easy mask induction, used glidescope due to recent history of acdf.  Maintained neutral head and neck position during induction.  Anterior glottic opening required cricoid pressure to achieve view with glidescope.  bbs present, +etco2. Lips and teeth as preop

## 2020-10-19 NOTE — Progress Notes (Signed)
Counted Nucynta with husband, Joe. Total pills are 101 50mg   tablets,

## 2020-10-19 NOTE — Interval H&P Note (Signed)
History and Physical Interval Note:  10/19/2020 8:09 AM  Melissa Allen  has presented today for surgery, with the diagnosis of LEFT BREAST DUCTAL CARCINOMA IN SITU.  The various methods of treatment have been discussed with the patient and family. After consideration of risks, benefits and other options for treatment, the patient has consented to  Procedure(s): BILATERAL MASTECTOMY WITH LEFT SENTINEL LYMPH NODE BIOPSY (Bilateral) BILATERAL BREAST RECONSTRUCTION WITH PLACEMENT OF TISSUE EXPANDER AND ALLODERM (Bilateral) as a surgical intervention.  The patient's history has been reviewed, patient examined, no change in status, stable for surgery.  I have reviewed the patient's chart and labs.  Questions were answered to the patient's satisfaction.     [name]

## 2020-10-19 NOTE — Anesthesia Postprocedure Evaluation (Signed)
Anesthesia Post Note  Patient: Melissa Allen  Procedure(s) Performed: BILATERAL MASTECTOMY WITH LEFT SENTINEL LYMPH NODE BIOPSY (Bilateral Breast) BILATERAL BREAST RECONSTRUCTION WITH PLACEMENT OF TISSUE EXPANDER AND ALLODERM (Bilateral Breast)     Patient location during evaluation: PACU Anesthesia Type: Regional and General Level of consciousness: awake and alert Pain management: pain level controlled Vital Signs Assessment: post-procedure vital signs reviewed and stable Respiratory status: spontaneous breathing, nonlabored ventilation and respiratory function stable Cardiovascular status: blood pressure returned to baseline and stable Postop Assessment: no apparent nausea or vomiting Anesthetic complications: no   No complications documented.  Last Vitals:  Vitals:   10/19/20 1445 10/19/20 1500  BP: (!) 149/80 (!) 151/76  Pulse: (!) 111 (!) 117  Resp: 10 18  Temp:    SpO2: 97% 98%    Last Pain:  Vitals:   10/19/20 1500  TempSrc:   PainSc: 0-No pain                 Lynda Rainwater

## 2020-10-19 NOTE — Progress Notes (Signed)
Assisted Dr. Finucane with right, left, ultrasound guided, pectoralis block. Side rails up, monitors on throughout procedure. See vital signs in flow sheet. Tolerated Procedure well. °

## 2020-10-19 NOTE — Interval H&P Note (Signed)
History and Physical Interval Note:  10/19/2020 8:05 AM  Melissa Allen  has presented today for surgery, with the diagnosis of LEFT BREAST DUCTAL CARCINOMA IN SITU.  The various methods of treatment have been discussed with the patient and family. After consideration of risks, benefits and other options for treatment, the patient has consented to  Procedure(s): BILATERAL MASTECTOMY WITH LEFT SENTINEL LYMPH NODE BIOPSY (Bilateral) BILATERAL BREAST RECONSTRUCTION WITH PLACEMENT OF TISSUE EXPANDER AND ALLODERM (Bilateral) as a surgical intervention.  The patient's history has been reviewed, patient examined, no change in status, stable for surgery.  I have reviewed the patient's chart and labs.  Questions were answered to the patient's satisfaction.     Arnoldo Hooker Gwynn Chalker

## 2020-10-20 DIAGNOSIS — D0512 Intraductal carcinoma in situ of left breast: Secondary | ICD-10-CM | POA: Diagnosis not present

## 2020-10-20 NOTE — Discharge Summary (Signed)
Physician Discharge Summary  Patient ID: Melissa Allen MRN: [id number] DOB/AGE: [birth year] 55 y.o.  Admit date: 10/19/2020 Discharge date: 10/20/2020  Admission Diagnoses: DCIS left breast  Discharge Diagnoses:  same  Discharged Condition: stable  Hospital Course: Post operatively patient tolerated diet, ambulated with minimal assist and pain controlled on oral medication. Instructed on bathing and drain care.  Treatments: surgery: bilateral mastectomies left sentinel node bilateral breast reconstruction with tissue expanders acellular dermis 2.11.22  Discharge Exam: Blood pressure 126/79, pulse 86, temperature 98.7 F (37.1 C), resp. rate 18, height 5\' 3"  (1.6 m), weight 91.6 kg, last menstrual period 12/21/2011, SpO2 98 %. Incision/Wound: chest incisions intact dry drains serosanguinous chest soft  Disposition: Discharge disposition: 01-Home or Self Care       Discharge Instructions    Call MD for:  redness, tenderness, or signs of infection (pain, swelling, bleeding, redness, odor or green/yellow discharge around incision site)   Complete by: As directed    Call MD for:  temperature >100.5   Complete by: As directed    Discharge instructions   Complete by: As directed    Ok to remove dressings and shower am 2.13.22. Soap and water ok, pat Tegaderms dry. Do not remove Tegaderms. No creams or ointments over incisions. Do not let drains dangle in shower, attach to lanyard or similar.Strip and record drains twice daily and bring log to clinic visit.  Breast binder or soft compression bra other times.  Ok to raise arms above shoulders for bathing and dressing.  No house yard work or exercise until cleared by MD.    Patient received all Rx preop. Patient's pain medication managed by her pain physician; she has pain medication and muscle relaxant at home.   Driving Restrictions   Complete by: As directed    No driving for 2 weeks   Lifting restrictions   Complete by: As  directed    No lifting > 5 lbs until cleared by MD   Resume previous diet   Complete by: As directed      Allergies as of 10/20/2020      Reactions   Chlorquinaldol Rash   11/30/19 patch testing: positive reaction to Quinoline Mix (contains clioquinol and chlorquinaldol)   Clioquinol Rash   11/30/19 patch testing: positive reaction to Quinoline Mix (contains clioquinol and chlorquinaldol)   Other Anaphylaxis   Pt has anaphylactic reaction to something during procedure - unsure of what  Per patient, Quinoline Mix  - based on allergy testing   Latex Itching, Swelling, Dermatitis   SWELLING FROM FACE MASK RING AFTER LAST SURGERY   Lisinopril Cough      Codeine Nausea And Vomiting   Hydrocodone Itching   Tape Rash   TOLERATES PAPER TAPE ONLY NO PINK SURGICAL TAPE EITHER, EKG Leads       Medication List    TAKE these medications   albuterol 108 (90 Base) MCG/ACT inhaler Commonly known as: ProAir HFA INHALE TWO PUFFS EVERY FOUR HOURS AS NEEDED SHORTNESS OF BREATH   Belsomra 20 MG Tabs Generic drug: Suvorexant Take 20 mg by mouth.   doxepin 10 MG capsule Commonly known as: SINEQUAN Take 30 mg by mouth at bedtime.   DULoxetine 60 MG capsule Commonly known as: CYMBALTA Take 90 mg by mouth daily.   EPINEPHrine 0.3 mg/0.3 mL Soaj injection Commonly known as: EPI-PEN Inject 0.3 mLs (0.3 mg total) into the muscle as needed for anaphylaxis.   estradiol 1 MG tablet Commonly known as: ESTRACE Take  1 tablet (1 mg total) by mouth daily.   fluticasone 50 MCG/ACT nasal spray Commonly known as: FLONASE USE 2 SPRAYS INTO EACH NOSTRIL ONCE DAILY AS DIRECTED   ibuprofen 200 MG tablet Commonly known as: ADVIL You can safely take 3 tablets every 6 hours as needed for pain.  He can alternate this with plain Tylenol.  You can also alternate with the prescribed narcotic.  I would recommend to use the Tylenol and ibuprofen as your first line pain medications. What changed:   how much to  take  how to take this  when to take this  reasons to take this   losartan 50 MG tablet Commonly known as: COZAAR TAKE 1 TABLET BY MOUTH EVERY DAY   Olopatadine HCl 0.2 % Soln PLACE ONE DROP INTO BOTH EYES TWO TIMES DAILY AS NEEDED   pantoprazole 40 MG tablet Commonly known as: PROTONIX Take 1 tablet (40 mg total) by mouth 2 (two) times daily before a meal.   Restasis 0.05 % ophthalmic emulsion Generic drug: cycloSPORINE Place 1 drop into both eyes 3 (three) times daily.   tapentadol 50 MG tablet Commonly known as: NUCYNTA Nucynta 50 mg tablet   triamterene-hydrochlorothiazide 37.5-25 MG capsule Commonly known as: DYAZIDE Take 1 each (1 capsule total) by mouth daily.       Follow-up Information    Irene Limbo, MD In 1 week.   Specialty: Plastic Surgery Why: as scheduled Contact information: [address] [place] [address] [phone]        [name], MD. Schedule an appointment as soon as possible for a visit in 2 weeks.   Specialty: General Surgery Contact information: [place] [address] [phone]               Signed: Irene Limbo 10/20/2020, 8:06 AM

## 2020-10-20 NOTE — Discharge Instructions (Signed)
°Post Anesthesia Home Care Instructions ° °Activity: °Get plenty of rest for the remainder of the day. A responsible individual must stay with you for 24 hours following the procedure.  °For the next 24 hours, DO NOT: °-Drive a car °-Operate machinery °-Drink alcoholic beverages °-Take any medication unless instructed by your physician °-Make any legal decisions or sign important papers. ° °Meals: °Start with liquid foods such as gelatin or soup. Progress to regular foods as tolerated. Avoid greasy, spicy, heavy foods. If nausea and/or vomiting occur, drink only clear liquids until the nausea and/or vomiting subsides. Call your physician if vomiting continues. ° °Special Instructions/Symptoms: °Your throat may feel dry or sore from the anesthesia or the breathing tube placed in your throat during surgery. If this causes discomfort, gargle with warm salt water. The discomfort should disappear within 24 hours. ° °If you had a scopolamine patch placed behind your ear for the management of post- operative nausea and/or vomiting: ° °1. The medication in the patch is effective for 72 hours, after which it should be removed.  Wrap patch in a tissue and discard in the trash. Wash hands thoroughly with soap and water. °2. You may remove the patch earlier than 72 hours if you experience unpleasant side effects which may include dry mouth, dizziness or visual disturbances. °3. Avoid touching the patch. Wash your hands with soap and water after contact with the patch. °  °About my Jackson-Pratt Bulb Drain ° °What is a Jackson-Pratt bulb? °A Jackson-Pratt is a soft, round device used to collect drainage. It is connected to a long, thin drainage catheter, which is held in place by one or two small stiches near your surgical incision site. When the bulb is squeezed, it forms a vacuum, forcing the drainage to empty into the bulb. ° °Emptying the Jackson-Pratt bulb- °To empty the bulb: °1. Release the plug on the top of the  bulb. °2. Pour the bulb's contents into a measuring container which your nurse will provide. °3. Record the time emptied and amount of drainage. Empty the drain(s) as often as your     doctor or nurse recommends. ° °Date                  Time                    Amount (Drain 1)                 Amount (Drain 2) ° °_____________________________________________________________________ ° °_____________________________________________________________________ ° °_____________________________________________________________________ ° °_____________________________________________________________________ ° °_____________________________________________________________________ ° °_____________________________________________________________________ ° °_____________________________________________________________________ ° °_____________________________________________________________________ ° °Squeezing the Jackson-Pratt Bulb- °To squeeze the bulb: °1. Make sure the plug at the top of the bulb is open. °2. Squeeze the bulb tightly in your fist. You will hear air squeezing from the bulb. °3. Replace the plug while the bulb is squeezed. °4. Use a safety pin to attach the bulb to your clothing. This will keep the catheter from     pulling at the bulb insertion site. ° °When to call your doctor- °Call your doctor if: °· Drain site becomes red, swollen or hot. °· You have a fever greater than 101 degrees F. °· There is oozing at the drain site. °· Drain falls out (apply a guaze bandage over the drain hole and secure it with tape). °· Drainage increases daily not related to activity patterns. (You will usually have more drainage when you are active than when you are resting.) °· Drainage has a bad   odor. ° ° °

## 2020-10-22 ENCOUNTER — Encounter (HOSPITAL_BASED_OUTPATIENT_CLINIC_OR_DEPARTMENT_OTHER): Payer: Self-pay | Admitting: General Surgery

## 2020-10-23 ENCOUNTER — Encounter: Payer: Self-pay | Admitting: *Deleted

## 2020-10-23 LAB — SURGICAL PATHOLOGY

## 2020-12-31 NOTE — H&P (Signed)
Subjective:     Patient ID: Melissa Allen is a 55 y.o. female.  HPI  10.5 weeks post op. Scheduled for implant exchange next month.  Presented following screening MMG showing 9 cm area pleomorphic calcifications involving all 4 quadrants. Diagnostic MMG/US6 cm area of calcifications in the left UOQ (1-3 o'clock); 4 cm segmental area of calcifications in left medial breast at 9 o'clock. Biopsy labeled lat upper quadrant and medial mid to lower both showed high grade DCIS with calcifications and necrosis, ER/PR+.  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  Mother with pancreatic ca. MGM with breast and ovarian ca. Genetics negative.  History subglandular augmentation mammaplasty with saline implants Dr. Dessie Coma. On imaging work up prior to mastectomies, bilateral rupture present.  Prior 36 D. Would not mind smaller volume. Right mastectomy 1175 g Left mastectomy 1374 g  PMH significant for HTN, PONV, fibromyalgia, multiple bouts pancreatitis, chronic back pain on Nucynta, has pain contract with Dr. Hardin Negus. Notes she was unable for several years to raise arm or turn head and was unable to complete MMG during this time.  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 Recently started part time work at [employer]. Lives with spouse who is retired Pharmacist, hospital. Three adult daughters.   Review of Systems     Objective:   Physical Exam Cardiovascular:     Rate and Rhythm: Regular rhythm.     Heart sounds: Normal heart sounds.  Pulmonary:     Effort: Pulmonary effort is normal.     Breath sounds: Normal breath sounds.     Chest:  Bilateral T junction healed Soft tissue has stretched with depression contour bilateral superior to expanders, in supine position bilateral expanders with lateral displacement  Assessment:     Left breast DCIS Hx augmentation mammaplasty-ruptured saline implant S/p bilateral SRM, left SLN,  bilateral prepectoral TE/ADM (Alloderm) reconstruciton    Plan:     Plan removal bilateral TE an placement implants placement ADM bilateral chest.  Reviewed OP surgery. Reviewed saline vs silicone, smooth v textured, shaped v round. As in prepectoral position I do recommend HCG or capacity filled silicone implants to reduce risk visible rippling. Reviewed MRI or USsurveillance for rupture with silicone implants.Reviewed examples for 4th generation, capacity filled 4th generation, and HCG implants vs saline implants. Reviewed risks AP flipping that may be more noticeable with 5th generation implants, may require surgery to correct.Discussed risk ALCL with textured devices.She will consider. Will provide Natrelle Inspira Reconstruction implant information. Reviewed with regards to size I anticipate she will have larger volume than her expanders but smaller than her natural breast volume. Patient has elected for silicone plan smooth round.  Completed Herma Carson physician patient checklist.  She has developed significant lateral displacement, stretching pocket and recommend additional acellular dermis to bilateral chest for support. Reviewed with this will plan one drain each chest for approximately one week. Patient feels she can comply and agrees to this. Reviewed off label use of ADM, no guarantees and can stretch out again.  Additional risks including but not limited to bleeding infection seroma hematoma blood clots in legs or lungs, poor scarring, unacceptable cosmetic result, asymmetry, need for additional surgery, damage to adjacent structures reviewed.  Patient's pain medication to be managed by her pain provider.   Natrelle 133S FV-13-T 500 ml tissue expanders placed bilateral,  RIGHT fill volume 500 ml saline LEFT fill volume 480 ml saline

## 2021-01-21 ENCOUNTER — Encounter (HOSPITAL_BASED_OUTPATIENT_CLINIC_OR_DEPARTMENT_OTHER): Payer: Self-pay | Admitting: Plastic Surgery

## 2021-01-28 ENCOUNTER — Ambulatory Visit (HOSPITAL_BASED_OUTPATIENT_CLINIC_OR_DEPARTMENT_OTHER)
Admission: RE | Admit: 2021-01-28 | Discharge: 2021-01-28 | Disposition: A | Discharge status: Home / Self Care | Payer: BC Managed Care – PPO | Attending: Plastic Surgery | Admitting: Plastic Surgery

## 2021-01-28 ENCOUNTER — Encounter (HOSPITAL_BASED_OUTPATIENT_CLINIC_OR_DEPARTMENT_OTHER)
Admission: RE | Disposition: A | Discharge status: Home / Self Care | Payer: Self-pay | Source: Home / Self Care | Attending: Plastic Surgery

## 2021-01-28 ENCOUNTER — Other Ambulatory Visit: Payer: Self-pay

## 2021-01-28 ENCOUNTER — Ambulatory Visit (HOSPITAL_BASED_OUTPATIENT_CLINIC_OR_DEPARTMENT_OTHER): Payer: BC Managed Care – PPO | Admitting: Certified Registered"

## 2021-01-28 ENCOUNTER — Encounter (HOSPITAL_BASED_OUTPATIENT_CLINIC_OR_DEPARTMENT_OTHER): Payer: Self-pay | Admitting: Plastic Surgery

## 2021-01-28 DIAGNOSIS — D0512 Intraductal carcinoma in situ of left breast: Secondary | ICD-10-CM | POA: Insufficient documentation

## 2021-01-28 DIAGNOSIS — Z421 Encounter for breast reconstruction following mastectomy: Secondary | ICD-10-CM | POA: Diagnosis present

## 2021-01-28 DIAGNOSIS — Z170 Estrogen receptor positive status [ER+]: Secondary | ICD-10-CM | POA: Insufficient documentation

## 2021-01-28 DIAGNOSIS — Z9013 Acquired absence of bilateral breasts and nipples: Secondary | ICD-10-CM | POA: Diagnosis not present

## 2021-01-28 DIAGNOSIS — M797 Fibromyalgia: Secondary | ICD-10-CM | POA: Diagnosis not present

## 2021-01-28 DIAGNOSIS — I10 Essential (primary) hypertension: Secondary | ICD-10-CM | POA: Insufficient documentation

## 2021-01-28 HISTORY — PX: REMOVAL OF BILATERAL TISSUE EXPANDERS WITH PLACEMENT OF BILATERAL BREAST IMPLANTS: SHX6431

## 2021-01-28 LAB — BASIC METABOLIC PANEL
Anion gap: 8 (ref 5–15)
BUN: 7 mg/dL (ref 6–20)
CO2: 27 mmol/L (ref 22–32)
Calcium: 9.4 mg/dL (ref 8.9–10.3)
Chloride: 102 mmol/L (ref 98–111)
Creatinine, Ser: 0.72 mg/dL (ref 0.44–1.00)
GFR, Estimated: >60 mL/min (ref >60)
Glucose, Bld: 89 mg/dL (ref 70–99)
Potassium: 4 mmol/L (ref 3.5–5.1)
Sodium: 137 mmol/L (ref 135–145)

## 2021-01-28 SURGERY — REMOVAL, TISSUE EXPANDER, BREAST, BILATERAL, WITH BILATERAL IMPLANT IMPLANT INSERTION
Anesthesia: Regional | Site: Chest

## 2021-01-28 MED ORDER — PROPOFOL 10 MG/ML IV BOLUS
INTRAVENOUS | Status: DC | PRN
  Administered 2021-01-28: 200 mg via INTRAVENOUS

## 2021-01-28 MED ORDER — MIDAZOLAM HCL 2 MG/2ML IJ SOLN
INTRAMUSCULAR | Status: AC
  Filled 2021-01-28: qty 2

## 2021-01-28 MED ORDER — GABAPENTIN 300 MG PO CAPS
300.0000 mg | ORAL_CAPSULE | ORAL | Status: AC
  Administered 2021-01-28: 300 mg via ORAL

## 2021-01-28 MED ORDER — SCOPOLAMINE 1 MG/3DAYS TD PT72: 1.0000 | MEDICATED_PATCH | TRANSDERMAL | Status: DC

## 2021-01-28 MED ORDER — SODIUM CHLORIDE 0.9 % IV SOLN
INTRAVENOUS | Status: DC | PRN
  Administered 2021-01-28: 600 mL

## 2021-01-28 MED ORDER — FENTANYL CITRATE (PF) 100 MCG/2ML IJ SOLN
INTRAMUSCULAR | Status: AC
  Filled 2021-01-28: qty 2

## 2021-01-28 MED ORDER — CHLORHEXIDINE GLUCONATE CLOTH 2 % EX PADS: 6.0000 | MEDICATED_PAD | Freq: Once | CUTANEOUS | Status: DC

## 2021-01-28 MED ORDER — CEFAZOLIN SODIUM-DEXTROSE 2-4 GM/100ML-% IV SOLN
2.0000 g | INTRAVENOUS | Status: AC
  Administered 2021-01-28: 2 g via INTRAVENOUS

## 2021-01-28 MED ORDER — PROPOFOL 10 MG/ML IV BOLUS
INTRAVENOUS | Status: AC
  Filled 2021-01-28: qty 20

## 2021-01-28 MED ORDER — FENTANYL CITRATE (PF) 100 MCG/2ML IJ SOLN
25.0000 ug | INTRAMUSCULAR | Status: DC | PRN
  Administered 2021-01-28 (×2): 50 ug via INTRAVENOUS

## 2021-01-28 MED ORDER — SUGAMMADEX SODIUM 200 MG/2ML IV SOLN
INTRAVENOUS | Status: DC | PRN
  Administered 2021-01-28: 200 mg via INTRAVENOUS

## 2021-01-28 MED ORDER — ONDANSETRON HCL 4 MG/2ML IJ SOLN
INTRAMUSCULAR | Status: DC | PRN
  Administered 2021-01-28: 4 mg via INTRAVENOUS

## 2021-01-28 MED ORDER — LIDOCAINE HCL (CARDIAC) PF 100 MG/5ML IV SOSY
PREFILLED_SYRINGE | INTRAVENOUS | Status: DC | PRN
  Administered 2021-01-28: 80 mg via INTRAVENOUS

## 2021-01-28 MED ORDER — DEXAMETHASONE SODIUM PHOSPHATE 4 MG/ML IJ SOLN
INTRAMUSCULAR | Status: DC | PRN
  Administered 2021-01-28: 10 mg via INTRAVENOUS

## 2021-01-28 MED ORDER — HYDROMORPHONE HCL 1 MG/ML IJ SOLN
INTRAMUSCULAR | Status: DC | PRN
  Administered 2021-01-28: .5 mg via INTRAVENOUS

## 2021-01-28 MED ORDER — CELECOXIB 200 MG PO CAPS
ORAL_CAPSULE | ORAL | Status: AC
  Filled 2021-01-28: qty 1

## 2021-01-28 MED ORDER — LACTATED RINGERS IV SOLN: INTRAVENOUS | Status: DC

## 2021-01-28 MED ORDER — HYDROMORPHONE HCL 1 MG/ML IJ SOLN
INTRAMUSCULAR | Status: AC
  Filled 2021-01-28: qty 1

## 2021-01-28 MED ORDER — GABAPENTIN 300 MG PO CAPS
ORAL_CAPSULE | ORAL | Status: AC
  Filled 2021-01-28: qty 1

## 2021-01-28 MED ORDER — BUPIVACAINE HCL (PF) 0.5 % IJ SOLN
INTRAMUSCULAR | Status: DC | PRN
  Administered 2021-01-28: 30 mL

## 2021-01-28 MED ORDER — PROMETHAZINE HCL 25 MG/ML IJ SOLN: 6.2500 mg | INTRAMUSCULAR | Status: DC | PRN

## 2021-01-28 MED ORDER — CEFAZOLIN SODIUM-DEXTROSE 2-4 GM/100ML-% IV SOLN
INTRAVENOUS | Status: AC
  Filled 2021-01-28: qty 100

## 2021-01-28 MED ORDER — ACETAMINOPHEN 500 MG PO TABS
ORAL_TABLET | ORAL | Status: AC
  Filled 2021-01-28: qty 2

## 2021-01-28 MED ORDER — AMISULPRIDE (ANTIEMETIC) 5 MG/2ML IV SOLN: 10.0000 mg | Freq: Once | INTRAVENOUS | Status: DC | PRN

## 2021-01-28 MED ORDER — SULFAMETHOXAZOLE-TRIMETHOPRIM 800-160 MG PO TABS: 1.0000 | ORAL_TABLET | Freq: Two times a day (BID) | ORAL | 0 refills | Status: DC

## 2021-01-28 MED ORDER — BUPIVACAINE HCL (PF) 0.5 % IJ SOLN
INTRAMUSCULAR | Status: AC
  Filled 2021-01-28: qty 30

## 2021-01-28 MED ORDER — CELECOXIB 200 MG PO CAPS
200.0000 mg | ORAL_CAPSULE | ORAL | Status: AC
Start: 2021-01-28 — End: 2021-01-28
  Administered 2021-01-28: 200 mg via ORAL

## 2021-01-28 MED ORDER — EPHEDRINE SULFATE 50 MG/ML IJ SOLN
INTRAMUSCULAR | Status: DC | PRN
  Administered 2021-01-28: 15 mg via INTRAVENOUS
  Administered 2021-01-28: 10 mg via INTRAVENOUS
  Administered 2021-01-28: 5 mg via INTRAVENOUS
  Administered 2021-01-28: 10 mg via INTRAVENOUS

## 2021-01-28 MED ORDER — HYDROMORPHONE HCL 1 MG/ML IJ SOLN: 0.2500 mg | INTRAMUSCULAR | Status: DC | PRN

## 2021-01-28 MED ORDER — PROPOFOL 500 MG/50ML IV EMUL
INTRAVENOUS | Status: DC | PRN
  Administered 2021-01-28: 30 ug/kg/min via INTRAVENOUS

## 2021-01-28 MED ORDER — FENTANYL CITRATE (PF) 100 MCG/2ML IJ SOLN
INTRAMUSCULAR | Status: DC | PRN
  Administered 2021-01-28 (×2): 100 ug via INTRAVENOUS

## 2021-01-28 MED ORDER — POVIDONE-IODINE 10 % EX SOLN
CUTANEOUS | Status: DC | PRN
  Administered 2021-01-28: 1 via TOPICAL

## 2021-01-28 MED ORDER — SODIUM CHLORIDE 0.9 % IV SOLN
INTRAVENOUS | Status: DC | PRN
  Administered 2021-01-28: 30 ug/min via INTRAVENOUS

## 2021-01-28 MED ORDER — ONDANSETRON HCL 4 MG/2ML IJ SOLN
INTRAMUSCULAR | Status: AC
  Filled 2021-01-28: qty 2

## 2021-01-28 MED ORDER — DEXAMETHASONE SODIUM PHOSPHATE 10 MG/ML IJ SOLN
INTRAMUSCULAR | Status: AC
  Filled 2021-01-28: qty 1

## 2021-01-28 MED ORDER — PROPOFOL 10 MG/ML IV BOLUS
INTRAVENOUS | Status: AC
  Filled 2021-01-28: qty 40

## 2021-01-28 MED ORDER — SODIUM CHLORIDE 0.9 % IV SOLN
INTRAVENOUS | Status: DC | PRN
  Administered 2021-01-28: 120 ug via INTRAVENOUS
  Administered 2021-01-28: 200 ug via INTRAVENOUS
  Administered 2021-01-28 (×2): 120 ug via INTRAVENOUS
  Administered 2021-01-28: 200 ug via INTRAVENOUS
  Administered 2021-01-28: 40 ug via INTRAVENOUS
  Administered 2021-01-28: 80 ug via INTRAVENOUS
  Administered 2021-01-28: 120 ug via INTRAVENOUS
  Administered 2021-01-28: 200 ug via INTRAVENOUS
  Administered 2021-01-28: 120 ug via INTRAVENOUS
  Administered 2021-01-28: 40 ug via INTRAVENOUS
  Administered 2021-01-28: 200 ug via INTRAVENOUS

## 2021-01-28 MED ORDER — SODIUM CHLORIDE 0.9 % IV SOLN
INTRAVENOUS | Status: AC
  Filled 2021-01-28: qty 10

## 2021-01-28 MED ORDER — DEXMEDETOMIDINE HCL 200 MCG/2ML IV SOLN
INTRAVENOUS | Status: DC | PRN
  Administered 2021-01-28 (×2): 8 ug via INTRAVENOUS

## 2021-01-28 MED ORDER — ROCURONIUM BROMIDE 100 MG/10ML IV SOLN
INTRAVENOUS | Status: DC | PRN
  Administered 2021-01-28: 60 mg via INTRAVENOUS

## 2021-01-28 MED ORDER — LIDOCAINE 2% (20 MG/ML) 5 ML SYRINGE
INTRAMUSCULAR | Status: AC
  Filled 2021-01-28: qty 5

## 2021-01-28 MED ORDER — ACETAMINOPHEN 500 MG PO TABS
1000.0000 mg | ORAL_TABLET | ORAL | Status: AC
  Administered 2021-01-28: 1000 mg via ORAL

## 2021-01-28 MED ORDER — MIDAZOLAM HCL 5 MG/5ML IJ SOLN
INTRAMUSCULAR | Status: DC | PRN
  Administered 2021-01-28: 2 mg via INTRAVENOUS

## 2021-01-28 SURGICAL SUPPLY — 71 items
ADH SKN CLS APL DERMABOND .7 (GAUZE/BANDAGES/DRESSINGS)
ALLODERM 8X16 MED THICK (Tissue) ×6 IMPLANT
APL PRP STRL LF DISP 70% ISPRP (MISCELLANEOUS) ×4
BAG DECANTER FOR FLEXI CONT (MISCELLANEOUS) ×3 IMPLANT
BINDER BREAST 3XL (GAUZE/BANDAGES/DRESSINGS) IMPLANT
BINDER BREAST LRG (GAUZE/BANDAGES/DRESSINGS) IMPLANT
BINDER BREAST MEDIUM (GAUZE/BANDAGES/DRESSINGS) IMPLANT
BINDER BREAST XLRG (GAUZE/BANDAGES/DRESSINGS) ×3 IMPLANT
BINDER BREAST XXLRG (GAUZE/BANDAGES/DRESSINGS) IMPLANT
BIOPATCH RED 1 DISK 7.0 (GAUZE/BANDAGES/DRESSINGS) ×6 IMPLANT
BLADE SURG 10 STRL SS (BLADE) ×6 IMPLANT
BNDG GAUZE ELAST 4 BULKY (GAUZE/BANDAGES/DRESSINGS) ×6 IMPLANT
CANISTER SUCT 1200ML W/VALVE (MISCELLANEOUS) ×3 IMPLANT
CHLORAPREP W/TINT 26 (MISCELLANEOUS) ×6 IMPLANT
COVER BACK TABLE 60X90IN (DRAPES) ×3 IMPLANT
COVER MAYO STAND STRL (DRAPES) ×3 IMPLANT
COVER WAND RF STERILE (DRAPES) IMPLANT
DECANTER SPIKE VIAL GLASS SM (MISCELLANEOUS) IMPLANT
DERMABOND ADVANCED (GAUZE/BANDAGES/DRESSINGS)
DERMABOND ADVANCED .7 DNX12 (GAUZE/BANDAGES/DRESSINGS) IMPLANT
DRAIN CHANNEL 15F RND FF W/TCR (WOUND CARE) ×6 IMPLANT
DRAPE TOP ARMCOVERS (MISCELLANEOUS) ×3 IMPLANT
DRAPE U-SHAPE 76X120 STRL (DRAPES) ×3 IMPLANT
DRAPE UTILITY XL STRL (DRAPES) ×6 IMPLANT
DRSG PAD ABDOMINAL 8X10 ST (GAUZE/BANDAGES/DRESSINGS) ×6 IMPLANT
DRSG TEGADERM 2-3/8X2-3/4 SM (GAUZE/BANDAGES/DRESSINGS) ×6 IMPLANT
DRSG TEGADERM 4X4.75 (GAUZE/BANDAGES/DRESSINGS) ×3 IMPLANT
ELECT BLADE 4.0 EZ CLEAN MEGAD (MISCELLANEOUS)
ELECT COATED BLADE 2.86 ST (ELECTRODE) ×3 IMPLANT
ELECT REM PT RETURN 9FT ADLT (ELECTROSURGICAL) ×3
ELECTRODE BLDE 4.0 EZ CLN MEGD (MISCELLANEOUS) IMPLANT
ELECTRODE REM PT RTRN 9FT ADLT (ELECTROSURGICAL) ×2 IMPLANT
EVACUATOR SILICONE 100CC (DRAIN) ×6 IMPLANT
GLOVE SURG HYDRASOFT LTX SZ5.5 (GLOVE) IMPLANT
GOWN STRL REUS W/ TWL LRG LVL3 (GOWN DISPOSABLE) ×4 IMPLANT
GOWN STRL REUS W/TWL LRG LVL3 (GOWN DISPOSABLE) ×6
IMPL BREAST 700CC (Breast) ×4 IMPLANT
IMPLANT BREAST 700CC (Breast) ×6 IMPLANT
KIT FILL SYSTEM UNIVERSAL (SET/KITS/TRAYS/PACK) IMPLANT
MARKER SKIN DUAL TIP RULER LAB (MISCELLANEOUS) ×3 IMPLANT
NEEDLE FILTER BLUNT 18X 1/2SAF (NEEDLE) ×1
NEEDLE FILTER BLUNT 18X1 1/2 (NEEDLE) ×2 IMPLANT
NEEDLE HYPO 25X1 1.5 SAFETY (NEEDLE) ×3 IMPLANT
PACK BASIN DAY SURGERY FS (CUSTOM PROCEDURE TRAY) ×3 IMPLANT
PENCIL SMOKE EVACUATOR (MISCELLANEOUS) ×3 IMPLANT
PIN SAFETY STERILE (MISCELLANEOUS) IMPLANT
PUNCH BIOPSY DERMAL 4MM (MISCELLANEOUS) ×3 IMPLANT
SHEET MEDIUM DRAPE 40X70 STRL (DRAPES) ×6 IMPLANT
SIZER BREAST REUSE XFP 650CC (SIZER) ×3
SIZER BREAST REUSE XFP 700CC (SIZER) ×3
SIZER BRST REUSE XFP 650CC (SIZER) ×2 IMPLANT
SIZER BRST REUSE XFP 700CC (SIZER) ×2 IMPLANT
SLEEVE SCD COMPRESS KNEE MED (STOCKING) ×3 IMPLANT
SPONGE LAP 18X18 RF (DISPOSABLE) ×6 IMPLANT
STAPLER VISISTAT 35W (STAPLE) ×3 IMPLANT
SUT ETHILON 2 0 FS 18 (SUTURE) ×3 IMPLANT
SUT MNCRL AB 4-0 PS2 18 (SUTURE) ×6 IMPLANT
SUT PDS AB 2-0 CT2 27 (SUTURE) ×12 IMPLANT
SUT VIC AB 3-0 PS1 18 (SUTURE)
SUT VIC AB 3-0 PS1 18XBRD (SUTURE) IMPLANT
SUT VIC AB 3-0 SH 27 (SUTURE) ×6
SUT VIC AB 3-0 SH 27X BRD (SUTURE) ×4 IMPLANT
SUT VICRYL 4-0 PS2 18IN ABS (SUTURE) ×6 IMPLANT
SYR 20ML LL LF (SYRINGE) ×3 IMPLANT
SYR BULB IRRIG 60ML STRL (SYRINGE) ×3 IMPLANT
SYR CONTROL 10ML LL (SYRINGE) ×3 IMPLANT
TISSUE ALLDRM 8X16 MED THICK (Tissue) ×4 IMPLANT
TOWEL GREEN STERILE FF (TOWEL DISPOSABLE) ×3 IMPLANT
TUBE CONNECTING 20X1/4 (TUBING) ×3 IMPLANT
UNDERPAD 30X36 HEAVY ABSORB (UNDERPADS AND DIAPERS) ×6 IMPLANT
YANKAUER SUCT BULB TIP NO VENT (SUCTIONS) ×3 IMPLANT

## 2021-01-28 NOTE — Op Note (Signed)
Operative Note   DATE OF OPERATION: 5.23.22  LOCATION: [facility]-outpatient  SURGICAL DIVISION: Plastic Surgery  PREOPERATIVE DIAGNOSES:  1. History DCIS 2. Acquired absence bilateral breasts  POSTOPERATIVE DIAGNOSES:  same  PROCEDURE:  1. Removal bilateral chest tissue expanders and placement silicone implants 2. Acellular dermis (Alloderm) to bilateral chest  SURGEON: Irene Limbo MD MBA  ASSISTANT: none  ANESTHESIA:  General.   EBL: 50 ml  COMPLICATIONS: None immediate.   INDICATIONS FOR PROCEDURE:  The patient, Melissa Allen, is a 55 y.o. female born on 11-Jul-1966, is here for staged breast reconstruction following bilateral skin reduction pattern mastectomies with immediate prepectoral tissue expander acellular dermis reconstruction.   FINDINGS: Significant lateral displacement bilateral expanders noted preoperatively. Intraoperatively bilateral acellular dermis non adherent to majority anterior mastectomy flap and underlying pectoralis muscle. Non adherent acellular dermis excised. Natrelle Soft Touch Extra Projection 700 ml smooth round implants placed bilateral. REF SSX-700 RIGHT SN [id number] LEFT SN [id number]  DESCRIPTION OF PROCEDURE:  The patient's operative site was marked with the patient in the preoperative area. The patient was taken to the operating room. SCDs were placed and IV antibiotics were given. The patient's operative site was prepped and draped in a sterile fashion. A time out was performed and all information was confirmed to be correct. Incision made in bilateral inframammary fold scars and carried through superficial fascia and acellular dermis. Tissue expanders removed. Seroma present over left chest. As above, acellular dermis non adherent over majority bilateral cavities and non adherent ADM excised. Sizers placed. A 700 ml Extra Projection implant selected. A 8 x 16 cm acelluar dermis prepared and perforated. Acellular dermis sewn to pectoralis  major with 2-0 PDS over left chest medial to desired anterior axillary line. Sizer placed in cavity. Desired inset of lateral border ADM marked over mastectomy flap. Sizer removed. Lateral border acellular dermis inset to anterior capsule/mastectomy flap with 2-0 PDS suture. Cavity irrigated with saline solution containing Ancef, gentamicin and Betadine. 15 Fr JP placed and secured to skin with 2-0 nylon. Implant placed in cavity and orientation ensured. Caudal border acellular dermis inset to chest wall and mastectomy flap with 2-0 PDS. Incision closed with 3-0 vicryl in superficial fascia and capsule, 4-0 vicryl in dermis, 4-0 monocryl subcuticular skin closure.  I then directed attention to right chest.  A 8 x 16 cm acelluar dermis prepared and perforated. Acellular dermis sewn to pectoralis major with 2-0 PDS over left chest medial to desired anterior axillary line. Sizer placed in cavity. Desired inset of lateral border ADM marked over mastectomy flap. Sizer removed. Lateral border acellular dermis inset to anterior capsule/mastectomy flap with 2-0 PDS suture. Cavity irrigated with saline solution containing Ancef, gentamicin and Betadine. 15 Fr JP placed and secured to skin with 2-0 nylon. Implant placed in cavity and orientation ensured. Caudal border acellular dermis inset to chest wall and mastectomy flap with 2-0 PDS. Incision closed with 3-0 vicryl in superficial fascia and capsule, 4-0 vicryl in dermis, 4-0 monocryl subcuticular skin closure. Biopatch and tegaderms applied to drain sites. Tegaderms applied over incisions.  The patient was allowed to wake from anesthesia, extubated and taken to the recovery room in satisfactory condition.   SPECIMENS: none  DRAINS: 15 Fr JP in right and left subcutaneous chest

## 2021-01-28 NOTE — Anesthesia Procedure Notes (Signed)
Procedure Name: Intubation Performed by: Verita Lamb, CRNA Pre-anesthesia Checklist: Patient identified, Emergency Drugs available, Suction available and Patient being monitored Patient Re-evaluated:Patient Re-evaluated prior to induction Oxygen Delivery Method: Circle system utilized Preoxygenation: Pre-oxygenation with 100% oxygen Induction Type: IV induction Ventilation: Mask ventilation without difficulty Laryngoscope Size: Glidescope and 3 Grade View: Grade I Tube type: Oral Tube size: 7.0 mm Number of attempts: 1 Airway Equipment and Method: Stylet and Oral airway Placement Confirmation: ETT inserted through vocal cords under direct vision,  positive ETCO2,  breath sounds checked- equal and bilateral and CO2 detector Secured at: 22 cm Tube secured with: Tape Dental Injury: Teeth and Oropharynx as per pre-operative assessment

## 2021-01-28 NOTE — Discharge Instructions (Signed)
May take Tylenol after 12:45 pm, if needed May take NSAIDS (Ibuprofen, Motrin) after 12:45 pm, if needed.    Post Anesthesia Home Care Instructions  Activity: Get plenty of rest for the remainder of the day. A responsible individual must stay with you for 24 hours following the procedure.  For the next 24 hours, DO NOT: -Drive a car -Paediatric nurse -Drink alcoholic beverages -Take any medication unless instructed by your physician -Make any legal decisions or sign important papers.  Meals: Start with liquid foods such as gelatin or soup. Progress to regular foods as tolerated. Avoid greasy, spicy, heavy foods. If nausea and/or vomiting occur, drink only clear liquids until the nausea and/or vomiting subsides. Call your physician if vomiting continues.  Special Instructions/Symptoms: Your throat may feel dry or sore from the anesthesia or the breathing tube placed in your throat during surgery. If this causes discomfort, gargle with warm salt water. The discomfort should disappear within 24 hours.  If you had a scopolamine patch placed behind your ear for the management of post- operative nausea and/or vomiting:  1. The medication in the patch is effective for 72 hours, after which it should be removed.  Wrap patch in a tissue and discard in the trash. Wash hands thoroughly with soap and water. 2. You may remove the patch earlier than 72 hours if you experience unpleasant side effects which may include dry mouth, dizziness or visual disturbances. 3. Avoid touching the patch. Wash your hands with soap and water after contact with the patch.

## 2021-01-28 NOTE — Anesthesia Postprocedure Evaluation (Signed)
Anesthesia Post Note  Patient: Melissa Allen  Procedure(s) Performed: REMOVAL OF BILATERAL TISSUE EXPANDERS WITH PLACEMENT OF BILATERAL BREAST IMPLANTS WITH ACELLULAR DERMIS (Bilateral Chest)     Patient location during evaluation: PACU Anesthesia Type: General Level of consciousness: sedated Pain management: pain level controlled Vital Signs Assessment: post-procedure vital signs reviewed and stable Respiratory status: spontaneous breathing and respiratory function stable Cardiovascular status: stable Postop Assessment: no apparent nausea or vomiting Anesthetic complications: no   No complications documented.  Last Vitals:  Vitals:   01/28/21 1105 01/28/21 1110  BP:  135/86  Pulse:  (!) 116  Resp:  12  Temp:  36.7 C  SpO2: 95% 95%    Last Pain:  Vitals:   01/28/21 1110  TempSrc:   PainSc: Melissa Allen

## 2021-01-28 NOTE — Anesthesia Preprocedure Evaluation (Addendum)
Anesthesia Evaluation  Patient identified by MRN, date of birth, ID band Patient awake    Reviewed: Allergy & Precautions, NPO status , Patient's Chart, lab work & pertinent test results  History of Anesthesia Complications (+) PONV and history of anesthetic complications  Airway Mallampati: II  TM Distance: >3 FB Neck ROM: Full   Comment: Preserved neck function Dental no notable dental hx. (+) Teeth Intact, Dental Advisory Given   Pulmonary asthma , COPD,    Pulmonary exam normal breath sounds clear to auscultation       Cardiovascular hypertension, Pt. on medications Normal cardiovascular exam Rhythm:Regular Rate:Normal  Echo 2020: normal LVEF and valves 1. Left ventricular ejection fraction, by visual estimation, is 55 to  60%. The left ventricle has normal function. There is no left ventricular  hypertrophy.  2. Left ventricular diastolic parameters are indeterminate.  3. The left ventricle has no regional wall motion abnormalities.  4. Global right ventricle has normal systolic function.The right  ventricular size is normal. No increase in right ventricular wall  thickness.  5. Left atrial size was normal.  6. Right atrial size was normal.  7. Presence of pericardial fat pad.  8. Trivial pericardial effusion is present.  9. Mild mitral annular calcification.  10. The mitral valve is grossly normal. Trace mitral valve regurgitation.  11. The tricuspid valve is grossly normal. Tricuspid valve regurgitation  is trivial.  12. The aortic valve is tricuspid. Aortic valve regurgitation is not  visualized. No evidence of aortic valve sclerosis or stenosis.  13. The pulmonic valve was grossly normal. Pulmonic valve regurgitation is  not visualized.  14. Normal pulmonary artery systolic pressure.  15. The tricuspid regurgitant velocity is 2.19 m/s, and with an assumed  right atrial pressure of 8 mmHg, the estimated  right ventricular systolic  pressure is normal at 27.2 mmHg.  16. The inferior vena cava is normal in size with <50% respiratory  variability, suggesting right atrial pressure of 8 mmHg.    Neuro/Psych  Headaches, Seizures -, Well Controlled,  PSYCHIATRIC DISORDERS Anxiety Depression    GI/Hepatic Neg liver ROS, GERD  Medicated and Controlled,  Endo/Other  Obesity BMI 35  Renal/GU negative Renal ROS  negative genitourinary   Musculoskeletal  (+) Arthritis , Osteoarthritis,  Fibromyalgia - (tapentadol)  Abdominal (+) + obese,   Peds  Hematology negative hematology ROS (+)   Anesthesia Other Findings L breast DCIS  Reproductive/Obstetrics negative OB ROS                            Anesthesia Physical  Anesthesia Plan  ASA: III  Anesthesia Plan: General   Post-op Pain Management:    Induction: Intravenous  PONV Risk Score and Plan: 4 or greater and Ondansetron, Dexamethasone, Midazolam and Treatment may vary due to age or medical condition  Airway Management Planned: Oral ETT and Video Laryngoscope Planned  Additional Equipment: None  Intra-op Plan:   Post-operative Plan: Extubation in OR  Informed Consent: I have reviewed the patients History and Physical, chart, labs and discussed the procedure including the risks, benefits and alternatives for the proposed anesthesia with the patient or authorized representative who has indicated his/her understanding and acceptance.     Dental advisory given  Plan Discussed with: Anesthesiologist and CRNA  Anesthesia Plan Comments:      Anesthesia Quick Evaluation

## 2021-01-28 NOTE — Transfer of Care (Signed)
Immediate Anesthesia Transfer of Care Note  Patient: Melissa Allen  Procedure(s) Performed: REMOVAL OF BILATERAL TISSUE EXPANDERS WITH PLACEMENT OF BILATERAL BREAST IMPLANTS WITH ACELLULAR DERMIS (Bilateral Chest)  Patient Location: PACU  Anesthesia Type:General  Level of Consciousness: awake, alert  and oriented  Airway & Oxygen Therapy: Patient Spontanous Breathing and Patient connected to face mask oxygen  Post-op Assessment: Report given to RN and Post -op Vital signs reviewed and stable  Post vital signs: Reviewed and stable  Last Vitals:  Vitals Value Taken Time  BP    Temp    Pulse    Resp    SpO2      Last Pain:  Vitals:   01/28/21 0638  TempSrc: Oral  PainSc: 7       Patients Stated Pain Goal: 5 (78/93/81 0175)  Complications: No complications documented.

## 2021-01-28 NOTE — Interval H&P Note (Signed)
History and Physical Interval Note:  01/28/2021 6:52 AM  Melissa Allen  has presented today for surgery, with the diagnosis of history DCIS, acquired absence breasts.  The various methods of treatment have been discussed with the patient and family. After consideration of risks, benefits and other options for treatment, the patient has consented to  removal bilateral chest tissue expanders and placement silicone implants, acellular dermis to bilateral chest as a surgical intervention.  The patient's history has been reviewed, patient examined, no change in status, stable for surgery.  I have reviewed the patient's chart and labs.  Questions were answered to the patient's satisfaction.     Arnoldo Hooker Manvi Guilliams

## 2021-01-29 ENCOUNTER — Encounter (HOSPITAL_BASED_OUTPATIENT_CLINIC_OR_DEPARTMENT_OTHER): Payer: Self-pay | Admitting: Plastic Surgery

## 2021-03-15 ENCOUNTER — Other Ambulatory Visit: Payer: Self-pay | Admitting: Family Medicine

## 2021-03-16 ENCOUNTER — Other Ambulatory Visit: Payer: Self-pay | Admitting: Family Medicine

## 2021-03-21 NOTE — Telephone Encounter (Signed)
LVM for patient to return call and schedule f/u appointment

## 2021-04-05 ENCOUNTER — Other Ambulatory Visit: Payer: Self-pay | Admitting: Neurosurgery

## 2021-04-05 DIAGNOSIS — M5412 Radiculopathy, cervical region: Secondary | ICD-10-CM

## 2021-04-18 ENCOUNTER — Ambulatory Visit
Admission: RE | Admit: 2021-04-18 | Discharge: 2021-04-18 | Disposition: A | Discharge status: Home / Self Care | Payer: BC Managed Care – PPO | Source: Ambulatory Visit | Attending: Neurosurgery | Admitting: Neurosurgery

## 2021-04-18 ENCOUNTER — Other Ambulatory Visit: Payer: Self-pay

## 2021-04-18 DIAGNOSIS — M5412 Radiculopathy, cervical region: Secondary | ICD-10-CM

## 2021-05-03 ENCOUNTER — Other Ambulatory Visit: Payer: Self-pay | Admitting: Family Medicine

## 2021-05-08 ENCOUNTER — Ambulatory Visit: Payer: BC Managed Care – PPO | Admitting: Podiatry

## 2021-05-09 ENCOUNTER — Encounter (HOSPITAL_BASED_OUTPATIENT_CLINIC_OR_DEPARTMENT_OTHER): Payer: Self-pay | Admitting: Plastic Surgery

## 2021-05-14 ENCOUNTER — Encounter (HOSPITAL_BASED_OUTPATIENT_CLINIC_OR_DEPARTMENT_OTHER)
Admission: RE | Admit: 2021-05-14 | Discharge: 2021-05-14 | Disposition: A | Discharge status: Home / Self Care | Payer: BC Managed Care – PPO | Source: Ambulatory Visit | Attending: Plastic Surgery | Admitting: Plastic Surgery

## 2021-05-14 DIAGNOSIS — Z803 Family history of malignant neoplasm of breast: Secondary | ICD-10-CM | POA: Diagnosis not present

## 2021-05-14 DIAGNOSIS — Z01812 Encounter for preprocedural laboratory examination: Secondary | ICD-10-CM | POA: Insufficient documentation

## 2021-05-14 DIAGNOSIS — Z8041 Family history of malignant neoplasm of ovary: Secondary | ICD-10-CM | POA: Diagnosis not present

## 2021-05-14 DIAGNOSIS — Z9013 Acquired absence of bilateral breasts and nipples: Secondary | ICD-10-CM | POA: Diagnosis not present

## 2021-05-14 DIAGNOSIS — Y834 Other reconstructive surgery as the cause of abnormal reaction of the patient, or of later complication, without mention of misadventure at the time of the procedure: Secondary | ICD-10-CM | POA: Diagnosis not present

## 2021-05-14 DIAGNOSIS — N650 Deformity of reconstructed breast: Secondary | ICD-10-CM | POA: Diagnosis not present

## 2021-05-14 DIAGNOSIS — Z800 Family history of malignant neoplasm of digestive organs: Secondary | ICD-10-CM | POA: Diagnosis not present

## 2021-05-14 DIAGNOSIS — T8542XA Displacement of breast prosthesis and implant, initial encounter: Secondary | ICD-10-CM | POA: Diagnosis not present

## 2021-05-14 DIAGNOSIS — Z86000 Personal history of in-situ neoplasm of breast: Secondary | ICD-10-CM | POA: Diagnosis not present

## 2021-05-14 LAB — BASIC METABOLIC PANEL
Anion gap: 12 (ref 5–15)
BUN: 9 mg/dL (ref 6–20)
CO2: 24 mmol/L (ref 22–32)
Calcium: 9.4 mg/dL (ref 8.9–10.3)
Chloride: 100 mmol/L (ref 98–111)
Creatinine, Ser: 0.78 mg/dL (ref 0.44–1.00)
GFR, Estimated: >60 mL/min (ref >60)
Glucose, Bld: 121 mg/dL — ABNORMAL HIGH (ref 70–99)
Potassium: 3 mmol/L — ABNORMAL LOW (ref 3.5–5.1)
Sodium: 136 mmol/L (ref 135–145)

## 2021-05-14 NOTE — Progress Notes (Signed)

## 2021-05-16 ENCOUNTER — Other Ambulatory Visit: Payer: Self-pay | Admitting: Orthopaedic Surgery

## 2021-05-16 DIAGNOSIS — M25511 Pain in right shoulder: Secondary | ICD-10-CM

## 2021-05-16 DIAGNOSIS — M25562 Pain in left knee: Secondary | ICD-10-CM

## 2021-05-17 ENCOUNTER — Encounter (HOSPITAL_BASED_OUTPATIENT_CLINIC_OR_DEPARTMENT_OTHER): Payer: Self-pay | Admitting: Plastic Surgery

## 2021-05-17 ENCOUNTER — Other Ambulatory Visit: Payer: Self-pay

## 2021-05-17 ENCOUNTER — Ambulatory Visit (HOSPITAL_BASED_OUTPATIENT_CLINIC_OR_DEPARTMENT_OTHER): Payer: BC Managed Care – PPO | Admitting: Certified Registered"

## 2021-05-17 ENCOUNTER — Encounter (HOSPITAL_BASED_OUTPATIENT_CLINIC_OR_DEPARTMENT_OTHER)
Admission: RE | Disposition: A | Discharge status: Home / Self Care | Payer: Self-pay | Source: Home / Self Care | Attending: Plastic Surgery

## 2021-05-17 ENCOUNTER — Ambulatory Visit (HOSPITAL_BASED_OUTPATIENT_CLINIC_OR_DEPARTMENT_OTHER)
Admission: RE | Admit: 2021-05-17 | Discharge: 2021-05-17 | Disposition: A | Discharge status: Home / Self Care | Payer: BC Managed Care – PPO | Attending: Plastic Surgery | Admitting: Plastic Surgery

## 2021-05-17 DIAGNOSIS — Z803 Family history of malignant neoplasm of breast: Secondary | ICD-10-CM | POA: Insufficient documentation

## 2021-05-17 DIAGNOSIS — N650 Deformity of reconstructed breast: Secondary | ICD-10-CM | POA: Diagnosis not present

## 2021-05-17 DIAGNOSIS — Z86000 Personal history of in-situ neoplasm of breast: Secondary | ICD-10-CM | POA: Insufficient documentation

## 2021-05-17 DIAGNOSIS — Y834 Other reconstructive surgery as the cause of abnormal reaction of the patient, or of later complication, without mention of misadventure at the time of the procedure: Secondary | ICD-10-CM | POA: Insufficient documentation

## 2021-05-17 DIAGNOSIS — Z8041 Family history of malignant neoplasm of ovary: Secondary | ICD-10-CM | POA: Insufficient documentation

## 2021-05-17 DIAGNOSIS — Z800 Family history of malignant neoplasm of digestive organs: Secondary | ICD-10-CM | POA: Insufficient documentation

## 2021-05-17 DIAGNOSIS — T8542XA Displacement of breast prosthesis and implant, initial encounter: Secondary | ICD-10-CM | POA: Insufficient documentation

## 2021-05-17 DIAGNOSIS — Z9013 Acquired absence of bilateral breasts and nipples: Secondary | ICD-10-CM | POA: Insufficient documentation

## 2021-05-17 SURGERY — REVISION, RECONSTRUCTION, BREAST
Anesthesia: General | Site: Breast | Laterality: Bilateral

## 2021-05-17 MED ORDER — ROCURONIUM BROMIDE 10 MG/ML (PF) SYRINGE
PREFILLED_SYRINGE | INTRAVENOUS | Status: AC
  Filled 2021-05-17: qty 10

## 2021-05-17 MED ORDER — SUGAMMADEX SODIUM 500 MG/5ML IV SOLN
INTRAVENOUS | Status: AC
  Filled 2021-05-17: qty 5

## 2021-05-17 MED ORDER — LACTATED RINGERS IV SOLN: INTRAVENOUS | Status: DC

## 2021-05-17 MED ORDER — GABAPENTIN 300 MG PO CAPS
300.0000 mg | ORAL_CAPSULE | ORAL | Status: AC
  Administered 2021-05-17: 300 mg via ORAL

## 2021-05-17 MED ORDER — POVIDONE-IODINE 10 % EX SOLN
CUTANEOUS | Status: DC | PRN
  Administered 2021-05-17: 1 via TOPICAL

## 2021-05-17 MED ORDER — CEFAZOLIN SODIUM-DEXTROSE 2-4 GM/100ML-% IV SOLN
2.0000 g | INTRAVENOUS | Status: AC
  Administered 2021-05-17: 2 g via INTRAVENOUS

## 2021-05-17 MED ORDER — PROPOFOL 500 MG/50ML IV EMUL
INTRAVENOUS | Status: DC | PRN
  Administered 2021-05-17: 250 ug/kg/min via INTRAVENOUS

## 2021-05-17 MED ORDER — ROCURONIUM BROMIDE 100 MG/10ML IV SOLN
INTRAVENOUS | Status: DC | PRN
  Administered 2021-05-17: 80 mg via INTRAVENOUS

## 2021-05-17 MED ORDER — CELECOXIB 200 MG PO CAPS
200.0000 mg | ORAL_CAPSULE | ORAL | Status: AC
  Administered 2021-05-17: 200 mg via ORAL

## 2021-05-17 MED ORDER — GABAPENTIN 300 MG PO CAPS
ORAL_CAPSULE | ORAL | Status: AC
  Filled 2021-05-17: qty 1

## 2021-05-17 MED ORDER — MIDAZOLAM HCL 5 MG/5ML IJ SOLN: INTRAMUSCULAR | Status: DC | PRN

## 2021-05-17 MED ORDER — ACETAMINOPHEN 500 MG PO TABS
1000.0000 mg | ORAL_TABLET | ORAL | Status: AC
  Administered 2021-05-17: 1000 mg via ORAL

## 2021-05-17 MED ORDER — LIDOCAINE 2% (20 MG/ML) 5 ML SYRINGE
INTRAMUSCULAR | Status: AC
  Filled 2021-05-17: qty 5

## 2021-05-17 MED ORDER — CHLORHEXIDINE GLUCONATE CLOTH 2 % EX PADS: 6.0000 | MEDICATED_PAD | Freq: Once | CUTANEOUS | Status: DC

## 2021-05-17 MED ORDER — ONDANSETRON HCL 4 MG/2ML IJ SOLN
INTRAMUSCULAR | Status: DC | PRN
  Administered 2021-05-17: 4 mg via INTRAVENOUS

## 2021-05-17 MED ORDER — FENTANYL CITRATE (PF) 100 MCG/2ML IJ SOLN
INTRAMUSCULAR | Status: AC
  Filled 2021-05-17: qty 2

## 2021-05-17 MED ORDER — SUGAMMADEX SODIUM 200 MG/2ML IV SOLN
INTRAVENOUS | Status: DC | PRN
Start: 2021-05-17 — End: 2021-05-17
  Administered 2021-05-17: 200 mg via INTRAVENOUS

## 2021-05-17 MED ORDER — HYDROMORPHONE HCL 1 MG/ML IJ SOLN
INTRAMUSCULAR | Status: DC | PRN
  Administered 2021-05-17: .5 mg via INTRAVENOUS

## 2021-05-17 MED ORDER — HYDROMORPHONE HCL 1 MG/ML IJ SOLN
0.2500 mg | INTRAMUSCULAR | Status: DC | PRN
  Administered 2021-05-17: 0.25 mg via INTRAVENOUS
  Administered 2021-05-17: 0.5 mg via INTRAVENOUS
  Administered 2021-05-17: 0.25 mg via INTRAVENOUS

## 2021-05-17 MED ORDER — SODIUM CHLORIDE 0.9 % IV SOLN
INTRAVENOUS | Status: AC
  Filled 2021-05-17: qty 10

## 2021-05-17 MED ORDER — PHENYLEPHRINE HCL (PRESSORS) 10 MG/ML IV SOLN
INTRAVENOUS | Status: AC
  Filled 2021-05-17: qty 1

## 2021-05-17 MED ORDER — PHENYLEPHRINE 40 MCG/ML (10ML) SYRINGE FOR IV PUSH (FOR BLOOD PRESSURE SUPPORT)
PREFILLED_SYRINGE | INTRAVENOUS | Status: AC
  Filled 2021-05-17: qty 10

## 2021-05-17 MED ORDER — PHENYLEPHRINE HCL-NACL 20-0.9 MG/250ML-% IV SOLN
INTRAVENOUS | Status: DC | PRN
  Administered 2021-05-17: 40 ug/min via INTRAVENOUS

## 2021-05-17 MED ORDER — HYDROMORPHONE HCL 1 MG/ML IJ SOLN
INTRAMUSCULAR | Status: AC
  Filled 2021-05-17: qty 0.5

## 2021-05-17 MED ORDER — MIDAZOLAM HCL 2 MG/2ML IJ SOLN
INTRAMUSCULAR | Status: AC
  Filled 2021-05-17: qty 2

## 2021-05-17 MED ORDER — FENTANYL CITRATE (PF) 100 MCG/2ML IJ SOLN
INTRAMUSCULAR | Status: DC | PRN
  Administered 2021-05-17: 100 ug via INTRAVENOUS

## 2021-05-17 MED ORDER — PROPOFOL 10 MG/ML IV BOLUS
INTRAVENOUS | Status: DC | PRN
  Administered 2021-05-17: 150 mg via INTRAVENOUS

## 2021-05-17 MED ORDER — DEXMEDETOMIDINE (PRECEDEX) IN NS 20 MCG/5ML (4 MCG/ML) IV SYRINGE
PREFILLED_SYRINGE | INTRAVENOUS | Status: DC | PRN
  Administered 2021-05-17 (×2): 8 ug via INTRAVENOUS
  Administered 2021-05-17: 4 ug via INTRAVENOUS

## 2021-05-17 MED ORDER — DEXAMETHASONE SODIUM PHOSPHATE 10 MG/ML IJ SOLN
INTRAMUSCULAR | Status: AC
  Filled 2021-05-17: qty 1

## 2021-05-17 MED ORDER — PHENYLEPHRINE HCL (PRESSORS) 10 MG/ML IV SOLN
INTRAVENOUS | Status: DC | PRN
  Administered 2021-05-17 (×2): 40 ug via INTRAVENOUS

## 2021-05-17 MED ORDER — CELECOXIB 200 MG PO CAPS
ORAL_CAPSULE | ORAL | Status: AC
  Filled 2021-05-17: qty 1

## 2021-05-17 MED ORDER — BUPIVACAINE HCL (PF) 0.5 % IJ SOLN
INTRAMUSCULAR | Status: AC
  Filled 2021-05-17: qty 30

## 2021-05-17 MED ORDER — SUCCINYLCHOLINE CHLORIDE 200 MG/10ML IV SOSY
PREFILLED_SYRINGE | INTRAVENOUS | Status: AC
  Filled 2021-05-17: qty 10

## 2021-05-17 MED ORDER — EPHEDRINE 5 MG/ML INJ
INTRAVENOUS | Status: AC
  Filled 2021-05-17: qty 5

## 2021-05-17 MED ORDER — DEXAMETHASONE SODIUM PHOSPHATE 4 MG/ML IJ SOLN
INTRAMUSCULAR | Status: DC | PRN
  Administered 2021-05-17: 10 mg via INTRAVENOUS

## 2021-05-17 MED ORDER — SODIUM CHLORIDE 0.9 % IV SOLN
INTRAVENOUS | Status: DC | PRN
  Administered 2021-05-17: 600 mL

## 2021-05-17 MED ORDER — CEFAZOLIN SODIUM-DEXTROSE 2-4 GM/100ML-% IV SOLN
INTRAVENOUS | Status: AC
  Filled 2021-05-17: qty 100

## 2021-05-17 MED ORDER — ACETAMINOPHEN 500 MG PO TABS
ORAL_TABLET | ORAL | Status: AC
  Filled 2021-05-17: qty 2

## 2021-05-17 MED ORDER — KETAMINE HCL 10 MG/ML IJ SOLN
INTRAMUSCULAR | Status: DC | PRN
  Administered 2021-05-17: 40 mg via INTRAVENOUS
  Administered 2021-05-17 (×3): 10 mg via INTRAVENOUS

## 2021-05-17 MED ORDER — ONDANSETRON HCL 4 MG/2ML IJ SOLN
INTRAMUSCULAR | Status: AC
  Filled 2021-05-17: qty 2

## 2021-05-17 MED ORDER — MIDAZOLAM HCL 5 MG/5ML IJ SOLN
INTRAMUSCULAR | Status: DC | PRN
  Administered 2021-05-17: 2 mg via INTRAVENOUS

## 2021-05-17 MED ORDER — LIDOCAINE HCL (CARDIAC) PF 100 MG/5ML IV SOSY
PREFILLED_SYRINGE | INTRAVENOUS | Status: DC | PRN
  Administered 2021-05-17: 80 mg via INTRAVENOUS

## 2021-05-17 MED ORDER — HYDROMORPHONE HCL 1 MG/ML IJ SOLN
INTRAMUSCULAR | Status: AC
  Filled 2021-05-17: qty 2

## 2021-05-17 MED ORDER — BUPIVACAINE HCL (PF) 0.5 % IJ SOLN
INTRAMUSCULAR | Status: DC | PRN
  Administered 2021-05-17: 40 mL

## 2021-05-17 SURGICAL SUPPLY — 70 items
ADH SKN CLS APL DERMABOND .7 (GAUZE/BANDAGES/DRESSINGS) ×2
APL PRP STRL LF DISP 70% ISPRP (MISCELLANEOUS) ×3
BAG DECANTER FOR FLEXI CONT (MISCELLANEOUS) ×2 IMPLANT
BINDER BREAST LRG (GAUZE/BANDAGES/DRESSINGS) IMPLANT
BINDER BREAST MEDIUM (GAUZE/BANDAGES/DRESSINGS) IMPLANT
BINDER BREAST XLRG (GAUZE/BANDAGES/DRESSINGS) ×2 IMPLANT
BINDER BREAST XXLRG (GAUZE/BANDAGES/DRESSINGS) IMPLANT
BLADE SURG 10 STRL SS (BLADE) ×4 IMPLANT
BLADE SURG 15 STRL LF DISP TIS (BLADE) ×1 IMPLANT
BLADE SURG 15 STRL SS (BLADE) ×2
BNDG GAUZE ELAST 4 BULKY (GAUZE/BANDAGES/DRESSINGS) IMPLANT
CANISTER SUCT 1200ML W/VALVE (MISCELLANEOUS) ×2 IMPLANT
CHLORAPREP W/TINT 26 (MISCELLANEOUS) ×6 IMPLANT
COUNTER NEEDLE 1200 MAGNETIC (NEEDLE) IMPLANT
COVER BACK TABLE 60X90IN (DRAPES) ×2 IMPLANT
COVER MAYO STAND STRL (DRAPES) ×2 IMPLANT
DECANTER SPIKE VIAL GLASS SM (MISCELLANEOUS) IMPLANT
DERMABOND ADVANCED (GAUZE/BANDAGES/DRESSINGS) ×2
DERMABOND ADVANCED .7 DNX12 (GAUZE/BANDAGES/DRESSINGS) ×2 IMPLANT
DRAIN CHANNEL 15F RND FF W/TCR (WOUND CARE) IMPLANT
DRAPE TOP ARMCOVERS (MISCELLANEOUS) ×2 IMPLANT
DRAPE U-SHAPE 76X120 STRL (DRAPES) ×2 IMPLANT
DRAPE UTILITY XL STRL (DRAPES) ×4 IMPLANT
DRSG PAD ABDOMINAL 8X10 ST (GAUZE/BANDAGES/DRESSINGS) ×4 IMPLANT
DRSG TEGADERM 4X10 (GAUZE/BANDAGES/DRESSINGS) IMPLANT
DRSG TEGADERM 4X4.75 (GAUZE/BANDAGES/DRESSINGS) IMPLANT
ELECT BLADE 4.0 EZ CLEAN MEGAD (MISCELLANEOUS)
ELECT COATED BLADE 2.86 ST (ELECTRODE) ×2 IMPLANT
ELECT REM PT RETURN 9FT ADLT (ELECTROSURGICAL) ×2
ELECTRODE BLDE 4.0 EZ CLN MEGD (MISCELLANEOUS) IMPLANT
ELECTRODE REM PT RTRN 9FT ADLT (ELECTROSURGICAL) ×1 IMPLANT
EVACUATOR SILICONE 100CC (DRAIN) ×4 IMPLANT
GLOVE SURG POLY MICRO LF SZ5.5 (GLOVE) ×4 IMPLANT
GLOVE SURG POLYISO LF SZ6 (GLOVE) IMPLANT
GLOVE SURG UNDER POLY LF SZ6.5 (GLOVE) IMPLANT
GOWN STRL REUS W/ TWL LRG LVL3 (GOWN DISPOSABLE) ×3 IMPLANT
GOWN STRL REUS W/TWL LRG LVL3 (GOWN DISPOSABLE) ×6
IMPL BREAST 700CC (Breast) ×2 IMPLANT
IMPLANT BREAST 700CC (Breast) ×4 IMPLANT
IV NS 500ML (IV SOLUTION)
IV NS 500ML BAXH (IV SOLUTION) IMPLANT
MARKER SKIN DUAL TIP RULER LAB (MISCELLANEOUS) IMPLANT
NEEDLE HYPO 25X1 1.5 SAFETY (NEEDLE) ×2 IMPLANT
PACK BASIN DAY SURGERY FS (CUSTOM PROCEDURE TRAY) ×2 IMPLANT
PENCIL SMOKE EVACUATOR (MISCELLANEOUS) ×2 IMPLANT
PIN SAFETY STERILE (MISCELLANEOUS) ×2 IMPLANT
PUNCH BIOPSY DERMAL 4MM (MISCELLANEOUS) ×2 IMPLANT
SHEET MEDIUM DRAPE 40X70 STRL (DRAPES) ×4 IMPLANT
SLEEVE SCD COMPRESS KNEE MED (STOCKING) ×2 IMPLANT
SPONGE T-LAP 18X18 ~~LOC~~+RFID (SPONGE) ×4 IMPLANT
STAPLER VISISTAT 35W (STAPLE) ×2 IMPLANT
SUT CHROMIC 4 0 PS 2 18 (SUTURE) IMPLANT
SUT ETHILON 2 0 FS 18 (SUTURE) ×2 IMPLANT
SUT MNCRL AB 4-0 PS2 18 (SUTURE) ×4 IMPLANT
SUT PDS AB 0 CT 36 (SUTURE) ×4 IMPLANT
SUT PDS AB 2-0 CT2 27 (SUTURE) ×4 IMPLANT
SUT VIC AB 3-0 SH 27 (SUTURE) ×2
SUT VIC AB 3-0 SH 27X BRD (SUTURE) ×1 IMPLANT
SUT VICRYL 0 CT-2 (SUTURE) IMPLANT
SUT VICRYL 4-0 PS2 18IN ABS (SUTURE) ×2 IMPLANT
SUT VLOC 180 0 24IN GS25 (SUTURE) ×4 IMPLANT
SYR BULB IRRIG 60ML STRL (SYRINGE) ×2 IMPLANT
SYR CONTROL 10ML LL (SYRINGE) ×2 IMPLANT
TAPE MEASURE VINYL STERILE (MISCELLANEOUS) IMPLANT
TISSUE MATRIX STATTICE 8X16 (Tissue) ×4 IMPLANT
TOWEL GREEN STERILE FF (TOWEL DISPOSABLE) ×2 IMPLANT
TRAY FOLEY W/BAG SLVR 14FR LF (SET/KITS/TRAYS/PACK) IMPLANT
TUBE CONNECTING 20X1/4 (TUBING) ×2 IMPLANT
UNDERPAD 30X36 HEAVY ABSORB (UNDERPADS AND DIAPERS) ×4 IMPLANT
YANKAUER SUCT BULB TIP NO VENT (SUCTIONS) ×2 IMPLANT

## 2021-05-17 NOTE — Discharge Instructions (Addendum)
No tylenol until after 1pm today. No ibuprofen/motrin until after 3pm today.   Post Anesthesia Home Care Instructions  Activity: Get plenty of rest for the remainder of the day. A responsible individual must stay with you for 24 hours following the procedure.  For the next 24 hours, DO NOT: -Drive a car -Paediatric nurse -Drink alcoholic beverages -Take any medication unless instructed by your physician -Make any legal decisions or sign important papers.  Meals: Start with liquid foods such as gelatin or soup. Progress to regular foods as tolerated. Avoid greasy, spicy, heavy foods. If nausea and/or vomiting occur, drink only clear liquids until the nausea and/or vomiting subsides. Call your physician if vomiting continues.  Special Instructions/Symptoms: Your throat may feel dry or sore from the anesthesia or the breathing tube placed in your throat during surgery. If this causes discomfort, gargle with warm salt water. The discomfort should disappear within 24 hours.  If you had a scopolamine patch placed behind your ear for the management of post- operative nausea and/or vomiting:  1. The medication in the patch is effective for 72 hours, after which it should be removed.  Wrap patch in a tissue and discard in the trash. Wash hands thoroughly with soap and water. 2. You may remove the patch earlier than 72 hours if you experience unpleasant side effects which may include dry mouth, dizziness or visual disturbances. 3. Avoid touching the patch. Wash your hands with soap and water after contact with the patch.    About my Jackson-Pratt Bulb Drain  What is a Jackson-Pratt bulb? A Jackson-Pratt is a soft, round device used to collect drainage. It is connected to a long, thin drainage catheter, which is held in place by one or two small stiches near your surgical incision site. When the bulb is squeezed, it forms a vacuum, forcing the drainage to empty into the bulb.  Emptying the  Jackson-Pratt bulb- To empty the bulb: 1. Release the plug on the top of the bulb. 2. Pour the bulb's contents into a measuring container which your nurse will provide. 3. Record the time emptied and amount of drainage. Empty the drain(s) as often as your     doctor or nurse recommends.  Date                  Time                    Amount (Drain 1)                 Amount (Drain 2)  _____________________________________________________________________  _____________________________________________________________________  _____________________________________________________________________  _____________________________________________________________________  _____________________________________________________________________  _____________________________________________________________________  _____________________________________________________________________  _____________________________________________________________________  Squeezing the Jackson-Pratt Bulb- To squeeze the bulb: 1. Make sure the plug at the top of the bulb is open. 2. Squeeze the bulb tightly in your fist. You will hear air squeezing from the bulb. 3. Replace the plug while the bulb is squeezed. 4. Use a safety pin to attach the bulb to your clothing. This will keep the catheter from     pulling at the bulb insertion site.  When to call your doctor- Call your doctor if: Drain site becomes red, swollen or hot. You have a fever greater than 101 degrees F. There is oozing at the drain site. Drain falls out (apply a guaze bandage over the drain hole and secure it with tape). Drainage increases daily not related to activity patterns. (You will usually have more drainage when you are active  than when you are resting.) Drainage has a bad odor.

## 2021-05-17 NOTE — Transfer of Care (Signed)
Immediate Anesthesia Transfer of Care Note  Patient: ADILIA BYRNES  Procedure(s) Performed: REVISION OF BILATERAL BREAST RECONSTRUCTION WITH SILICONE IMPLANT EXCHANGE, STRATTICE TO BILATERAL CHEST (Bilateral: Breast)  Patient Location: PACU  Anesthesia Type:General  Level of Consciousness: awake, alert , oriented, drowsy and patient cooperative  Airway & Oxygen Therapy: Patient Spontanous Breathing and Patient connected to face mask oxygen  Post-op Assessment: Report given to RN and Post -op Vital signs reviewed and stable  Post vital signs: Reviewed and stable  Last Vitals:  Vitals Value Taken Time  BP 133/85 05/17/21 1030  Temp    Pulse 104 05/17/21 1035  Resp 15 05/17/21 1035  SpO2 90 % 05/17/21 1035  Vitals shown include unvalidated device data.  Last Pain:  Vitals:   05/17/21 0638  TempSrc: Oral  PainSc: 7       Patients Stated Pain Goal: 8 ([phone] [id number])  Complications: No notable events documented.

## 2021-05-17 NOTE — Anesthesia Procedure Notes (Addendum)
Procedure Name: Intubation Date/Time: 05/17/2021 7:47 AM Performed by: Willa Frater, CRNA Pre-anesthesia Checklist: Patient identified, Emergency Drugs available, Suction available and Patient being monitored Patient Re-evaluated:Patient Re-evaluated prior to induction Oxygen Delivery Method: Circle system utilized Preoxygenation: Pre-oxygenation with 100% oxygen Induction Type: IV induction Ventilation: Mask ventilation without difficulty Laryngoscope Size: Glidescope, Mac and 3 Grade View: Grade II Tube type: Oral Tube size: 7.0 mm Number of attempts: 1 Airway Equipment and Method: Stylet and Oral airway Placement Confirmation: ETT inserted through vocal cords under direct vision, positive ETCO2 and breath sounds checked- equal and bilateral Secured at: 22 cm Tube secured with: Tape Dental Injury: Teeth and Oropharynx as per pre-operative assessment

## 2021-05-17 NOTE — Anesthesia Postprocedure Evaluation (Signed)
Anesthesia Post Note  Patient: Melissa Allen  Procedure(s) Performed: REVISION OF BILATERAL BREAST RECONSTRUCTION WITH SILICONE IMPLANT EXCHANGE, STRATTICE TO BILATERAL CHEST (Bilateral: Breast)     Patient location during evaluation: PACU Anesthesia Type: General Level of consciousness: awake and alert Pain management: pain level controlled Vital Signs Assessment: post-procedure vital signs reviewed and stable Respiratory status: spontaneous breathing, nonlabored ventilation, respiratory function stable and patient connected to nasal cannula oxygen Cardiovascular status: blood pressure returned to baseline and stable Postop Assessment: no apparent nausea or vomiting Anesthetic complications: no   No notable events documented.  Last Vitals:  Vitals:   05/17/21 1215 05/17/21 1323  BP: 135/80 132/74  Pulse: 86 83  Resp: 19 20  Temp:  37.2 C  SpO2: 99% 97%    Last Pain:  Vitals:   05/17/21 1323  TempSrc: Oral  PainSc: [place]

## 2021-05-17 NOTE — Anesthesia Preprocedure Evaluation (Addendum)
Anesthesia Evaluation  Patient identified by MRN, date of birth, ID band Patient awake    Reviewed: Allergy & Precautions, NPO status , Patient's Chart, lab work & pertinent test results  History of Anesthesia Complications (+) PONV  Airway Mallampati: IV  TM Distance: >3 FB Neck ROM: Limited  Mouth opening: Limited Mouth Opening  Dental  (+) Teeth Intact, Dental Advisory Given   Pulmonary asthma , COPD,  COPD inhaler,    Pulmonary exam normal breath sounds clear to auscultation       Cardiovascular hypertension, Pt. on medications + angina Normal cardiovascular exam Rhythm:Regular Rate:Normal     Neuro/Psych  Headaches, Seizures - (not on anti-epileptics), Well Controlled,  PSYCHIATRIC DISORDERS Anxiety Depression    GI/Hepatic Neg liver ROS, GERD  ,  Endo/Other  negative endocrine ROS  Renal/GU negative Renal ROS  negative genitourinary   Musculoskeletal  (+) Arthritis , Fibromyalgia -  Abdominal   Peds  Hematology negative hematology ROS (+)   Anesthesia Other Findings   Reproductive/Obstetrics                            Anesthesia Physical Anesthesia Plan  ASA: 3  Anesthesia Plan: General   Post-op Pain Management:    Induction: Intravenous  PONV Risk Score and Plan: 4 or greater and Midazolam, Dexamethasone, Ondansetron and TIVA  Airway Management Planned: Oral ETT and Video Laryngoscope Planned  Additional Equipment:   Intra-op Plan:   Post-operative Plan: Extubation in OR  Informed Consent: I have reviewed the patients History and Physical, chart, labs and discussed the procedure including the risks, benefits and alternatives for the proposed anesthesia with the patient or authorized representative who has indicated his/her understanding and acceptance.     Dental advisory given  Plan Discussed with: CRNA  Anesthesia Plan Comments:        Anesthesia Quick  Evaluation

## 2021-05-17 NOTE — H&P (Signed)
Subjective:     Patient ID: Melissa Allen is a 55 y.o. female.   HPI   3.5 months post op implant exchange with ADM for bilateral displacement. Noted left implant falling to side chest within 2 weeks of surgery. Noted recurrent displacement over right by 2 months post operative.    Presented following screening MMG showing 9 cm area pleomorphic calcifications involving all 4 quadrants. Diagnostic MMG/US 6 cm area of calcifications in the left UOQ (1-3 o'clock); 4 cm segmental area of calcifications in left medial breast at 9 o'clock. Biopsy labeled lat upper quadrant and medial mid to lower both showed high grade DCIS with calcifications and necrosis, ER/PR+.   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   Mother with pancreatic ca. MGM with breast and ovarian ca. Genetics negative.   History subglandular augmentation mammaplasty with saline implants Dr. Dessie Coma. On imaging work up prior to mastectomies, bilateral rupture present.   Prior 82 D. Would not mind smaller volume. Right mastectomy 1175 g Left mastectomy 1374 g   PMH significant for HTN, PONV, fibromyalgia, multiple bouts pancreatitis, chronic back pain on Nucynta, has pain contract with Dr. Hardin Negus. Notes she was unable for several years to raise arm or turn head and was unable to complete MMG during this time.   Worked as a Lawyer; stopped teaching due to back issues. Recently started part time work at [employer]. Lives with spouse who is retired Pharmacist, hospital. Three adult daughters.    Intraop 5.23.22 findings: "Significant lateral displacement bilateral expanders noted preoperatively. Intraoperatively bilateral acellular dermis non adherent to majority anterior mastectomy flap and underlying pectoralis muscle. Non adherent acellular dermis excised. "   Review of Systems      Objective:   Physical Exam  CV: normal heart sounds Pulm: clear to auscultation Chest:  bilateral chest  recurrent  significant lateral displacement implant in supine position, greater on left   Assessment:     Left breast DCIS Hx augmentation mammaplasty-ruptured saline implant S/p bilateral SRM, left SLN, bilateral prepectoral TE/ADM (Alloderm) reconstruction S/p removal bilateral TE, placement ADM (Alloderm)    Plan:     The ADM sling on left to support implant failed within first 2 weeks postoperative. Now recurrent lateral displacement right chest. Desires revision. Plan revision with use of Strattice for support- reviewed this product is in general stiffer, porcine source. Counseled there are no guarantees and similar outcome possible. Discussed referral for second opinion. Patient declined. In past reported implant volume small. Discussed increasing volume to 750, counseled 800 ml is largest in silicone and feel this is too wide for her chest. Discussed weight of implant does contribute to stretching of support and capsule. Patient declines change in volume implant.     Natrelle Soft Touch Extra Projection 700 ml smooth round implants placed bilateral. REF SSX-700

## 2021-05-17 NOTE — Op Note (Signed)
Operative Note   DATE OF OPERATION: 9.9.22  LOCATION: [facility]-outpatient  SURGICAL DIVISION: Plastic Surgery  PREOPERATIVE DIAGNOSES:  1. History DCIS 2.Acquired absence bilateral breasts  POSTOPERATIVE DIAGNOSES:  same  PROCEDURE:  Removal bilateral chest tissue expanders and placement silicone implants 2. Strattice to bilateral chest  SURGEON: Irene Limbo MD MBA  ASSISTANT: none  ANESTHESIA:  General.   EBL: 30 ml  COMPLICATIONS: None immediate.   INDICATIONS FOR PROCEDURE:  The patient, Melissa Allen, is a 55 y.o. female born on [birth year], is here for revision bilateral breast reconstruction for implant displacement.    FINDINGS: Significant lateral displacement bilateral implants noted preoperatively. Intraoperatively bilateral acellular dermis inset to anterior mastectomy flap intact and inset to pectoralis muscle not intact. Non adherent acellular dermis excised. Removed intact smooth round silicone implants. Placed new Natrelle Soft Touch Extra Projection 700 ml smooth round implants placed bilateral. REF [id number] RIGHT SN [id number] LEFT SN [id number]  DESCRIPTION OF PROCEDURE:  The patient's operative site was marked with the patient in the preoperative area. The patient was taken to the operating room. SCDs were placed and IV antibiotics were given. The patient's operative site was prepped and draped in a sterile fashion. A time out was performed and all information was confirmed to be correct. Incision made in bilateral inframammary fold scars and carried through superficial fascia and acellular dermis. Intact silicone smooth implants removed. Contour Strattice prepared and perforated. Over left chest, acellular dermis sewn to pectoralis major with 0 V lock suture and additional reinforcement 0 PDS sutures medial to desired anterior axillary line. Prior placed in cavity. Desired inset of lateral border Strattice marked over mastectomy flap. Prior implant removed.  Lateral border Strattice inset to anterior capsule/mastectomy flap with 2-0 and O PDS suture. Cavity irrigated with saline solution containing Ancef, gentamicin and Betadine. 57 Fr JP placed and secured to skin with 2-0 nylon. New implant placed in cavity and orientation ensured. Caudal border Strattice inset to chest wall and mastectomy flap with 2-0 PDS. Incision closed with 3-0 vicryl in superficial fascia and capsule, 4-0 vicryl in dermis, 4-0 monocryl subcuticular skin closure.   I then directed attention to right chest.  A Contour Strattice prepared and perforated. Stattice sewn to pectoralis major with 0 V lock and additional reinforcement 0 PDS sutreu over medial to desired anterior axillary line. Prior implant placed in cavity. Desired inset of lateral border Strattice marked over mastectomy flap. Prior implant removed. Lateral border Strattice inset to anterior capsule/mastectomy flap with 0 PDS suture. Cavity irrigated with saline solution containing Ancef, gentamicin and Betadine. 70 Fr JP placed and secured to skin with 2-0 nylon. Implant placed in cavity and orientation ensured. Caudal border acellular dermis inset to chest wall and mastectomy flap with 0 and  2-0 PDS. Incision closed with 3-0 vicryl in superficial fascia and capsule, 4-0 vicryl in dermis, 4-0 monocryl subcuticular skin closure. Dermabond applied to incisions.  The patient was allowed to wake from anesthesia, extubated and taken to the recovery room in satisfactory condition.   SPECIMENS: none  DRAINS: 47 Fr JP in right and left breast reconstruction

## 2021-05-27 ENCOUNTER — Encounter: Payer: Self-pay | Admitting: Plastic Surgery

## 2021-06-04 ENCOUNTER — Ambulatory Visit
Admission: RE | Admit: 2021-06-04 | Discharge: 2021-06-04 | Disposition: A | Discharge status: Home / Self Care | Payer: BC Managed Care – PPO | Source: Ambulatory Visit | Attending: Orthopaedic Surgery | Admitting: Orthopaedic Surgery

## 2021-06-04 DIAGNOSIS — M25562 Pain in left knee: Secondary | ICD-10-CM

## 2021-06-04 DIAGNOSIS — M25511 Pain in right shoulder: Secondary | ICD-10-CM

## 2021-07-05 ENCOUNTER — Other Ambulatory Visit: Payer: Self-pay | Admitting: Family Medicine

## 2021-07-05 DIAGNOSIS — E2839 Other primary ovarian failure: Secondary | ICD-10-CM

## 2021-08-08 ENCOUNTER — Other Ambulatory Visit: Payer: Self-pay | Admitting: Orthopedic Surgery

## 2021-08-09 NOTE — Progress Notes (Signed)
Sent message, via epic in basket, requesting orders in epic from surgeon.  

## 2021-08-09 NOTE — Patient Instructions (Addendum)
DUE TO COVID-19 ONLY ONE VISITOR IS ALLOWED TO COME WITH YOU AND STAY IN THE WAITING ROOM ONLY DURING PRE OP AND PROCEDURE.   **NO VISITORS ARE ALLOWED IN THE SHORT STAY AREA OR RECOVERY ROOM!!**  IF YOU WILL BE ADMITTED INTO THE HOSPITAL YOU ARE ALLOWED ONLY TWO SUPPORT PEOPLE DURING VISITATION HOURS ONLY (7 AM -8PM)   The support person(s) must pass our screening, gel in and out, and wear a mask at all times, including in the patient's room. Patients must also wear a mask when staff or their support person are in the room. Visitors GUEST BADGE MUST BE WORN VISIBLY  One adult visitor may remain with you overnight and MUST be in the room by 8 P.M.  No visitors under the age of 64. Any visitor under the age of 4 must be accompanied by an adult.        Your procedure is scheduled on: 08/22/21   Report to [facility] Main Entrance    Report to admitting at : 9:00 AM   Call this number if you have problems the morning of surgery [phone]   Do not eat food :After Midnight.   May have liquids until: 8:45 AM    day of surgery  CLEAR LIQUID DIET  Foods Allowed                                                                     Foods Excluded  Water, Black Coffee and tea, regular and decaf                             liquids that you cannot  Plain Jell-O in any flavor  (No red)                                           see through such as: Fruit ices (not with fruit pulp)                                     milk, soups, orange juice              Iced Popsicles (No red)                                    All solid food                                   Apple juices Sports drinks like Gatorade (No red) Lightly seasoned clear broth or consume(fat free) Sugar  Sample Menu Breakfast                                Lunch  Supper Cranberry juice                    Beef broth                            Chicken broth Jell-O                                      Grape juice                           Apple juice Coffee or tea                        Jell-O                                      Popsicle                                                Coffee or tea                        Coffee or tea     Complete one Ensure drink the morning of surgery at : 8:45 AM      the day of surgery.    The day of surgery:  Drink ONE (1) Pre-Surgery Clear Ensure or G2 by am the morning of surgery. Drink in one sitting. Do not sip.  This drink was given to you during your hospital  pre-op appointment visit. Nothing else to drink after completing the  Pre-Surgery Clear Ensure or G2.          If you have questions, please contact your surgeon's office.     Oral Hygiene is also important to reduce your risk of infection.                                    Remember - BRUSH YOUR TEETH THE MORNING OF SURGERY WITH YOUR REGULAR TOOTHPASTE   Do NOT smoke after Midnight   Take these medicines the morning of surgery with A SIP OF WATER: duloxetine,estradiol.Use inhalers and hydroxyzine as needed.Flonase as usual.                              You may not have any metal on your body including hair pins, jewelry, and body piercing             Do not wear make-up, lotions, powders, perfumes/cologne, or deodorant  Do not wear nail polish including gel and S&S, artificial/acrylic nails, or any other type of covering on natural nails including finger and toenails. If you have artificial nails, gel coating, etc. that needs to be removed by a nail salon please have this removed prior to surgery or surgery may need to be canceled/ delayed if the surgeon/ anesthesia feels like they are unable to be safely monitored.   Do not shave  48 hours prior to surgery.  Do not bring valuables to the hospital. [place].   Contacts, dentures or bridgework may not be worn into surgery.   Bring small overnight bag day of  surgery.    Patients discharged on the day of surgery will not be allowed to drive home.   Special Instructions: Bring a copy of your healthcare power of attorney and living will documents         the day of surgery if you haven't scanned them before.              Please read over the following fact sheets you were given: IF YOU HAVE QUESTIONS ABOUT YOUR PRE-OP INSTRUCTIONS PLEASE CALL [phone]     [facility] - Preparing for Surgery Before surgery, you can play an important role.  Because skin is not sterile, your skin needs to be as free of germs as possible.  You can reduce the number of germs on your skin by washing with CHG (chlorahexidine gluconate) soap before surgery.  CHG is an antiseptic cleaner which kills germs and bonds with the skin to continue killing germs even after washing. Please DO NOT use if you have an allergy to CHG or antibacterial soaps.  If your skin becomes reddened/irritated stop using the CHG and inform your nurse when you arrive at Short Stay. Do not shave (including legs and underarms) for at least 48 hours prior to the first CHG shower.  You may shave your face/neck. Please follow these instructions carefully:  1.  Shower with CHG Soap the night before surgery and the  morning of Surgery.  2.  If you choose to wash your hair, wash your hair first as usual with your  normal  shampoo.  3.  After you shampoo, rinse your hair and body thoroughly to remove the  shampoo.                           4.  Use CHG as you would any other liquid soap.  You can apply chg directly  to the skin and wash                       Gently with a scrungie or clean washcloth.  5.  Apply the CHG Soap to your body ONLY FROM THE NECK DOWN.   Do not use on face/ open                           Wound or open sores. Avoid contact with eyes, ears mouth and genitals (private parts).                       Wash face,  Genitals (private parts) with your normal soap.             6.  Wash thoroughly,  paying special attention to the area where your surgery  will be performed.  7.  Thoroughly rinse your body with warm water from the neck down.  8.  DO NOT shower/wash with your normal soap after using and rinsing off  the CHG Soap.                9.  Pat yourself dry with a clean towel.  10.  Wear clean pajamas.            11.  Place clean sheets on your bed the night of your first shower and do not  sleep with pets. Day of Surgery : Do not apply any lotions/deodorants the morning of surgery.  Please wear clean clothes to the hospital/surgery center.  FAILURE TO FOLLOW THESE INSTRUCTIONS MAY RESULT IN THE CANCELLATION OF YOUR SURGERY PATIENT SIGNATURE_________________________________  NURSE SIGNATURE__________________________________  ________________________________________________________________________ [facility]- Preparing for Total Shoulder Arthroplasty    Before surgery, you can play an important role. Because skin is not sterile, your skin needs to be as free of germs as possible. You can reduce the number of germs on your skin by using the following products. Benzoyl Peroxide Gel Reduces the number of germs present on the skin Applied twice a day to shoulder area starting two days before surgery    ==================================================================  Please follow these instructions carefully:  BENZOYL PEROXIDE 5% GEL  Please do not use if you have an allergy to benzoyl peroxide.   If your skin becomes reddened/irritated stop using the benzoyl peroxide.  Starting two days before surgery, apply as follows: Apply benzoyl peroxide in the morning and at night. Apply after taking a shower. If you are not taking a shower clean entire shoulder front, back, and side along with the armpit with a clean wet washcloth.  Place a quarter-sized dollop on your shoulder and rub in thoroughly, making sure to cover the front, back, and side of your shoulder, along  with the armpit.   2 days before ____ AM   ____ PM              1 day before ____ AM   ____ PM                         Do this twice a day for two days.  (Last application is the night before surgery, AFTER using the CHG soap as described below).  Do NOT apply benzoyl peroxide gel on the day of surgery.

## 2021-08-12 ENCOUNTER — Encounter (HOSPITAL_COMMUNITY): Payer: Self-pay

## 2021-08-12 ENCOUNTER — Other Ambulatory Visit: Payer: Self-pay

## 2021-08-12 ENCOUNTER — Encounter (HOSPITAL_COMMUNITY)
Admission: RE | Admit: 2021-08-12 | Discharge: 2021-08-12 | Disposition: A | Discharge status: Home / Self Care | Payer: BC Managed Care – PPO | Source: Ambulatory Visit | Attending: Orthopedic Surgery | Admitting: Orthopedic Surgery

## 2021-08-12 ENCOUNTER — Ambulatory Visit (HOSPITAL_COMMUNITY)
Admission: RE | Admit: 2021-08-12 | Discharge: 2021-08-12 | Disposition: A | Discharge status: Home / Self Care | Payer: BC Managed Care – PPO | Source: Ambulatory Visit | Attending: Orthopedic Surgery | Admitting: Orthopedic Surgery

## 2021-08-12 VITALS — BP 123/77 | HR 95 | Temp 98.1°F | Ht 64.0 in | Wt 196.0 lb

## 2021-08-12 DIAGNOSIS — Z01811 Encounter for preprocedural respiratory examination: Secondary | ICD-10-CM | POA: Diagnosis present

## 2021-08-12 DIAGNOSIS — Z01818 Encounter for other preprocedural examination: Secondary | ICD-10-CM

## 2021-08-12 DIAGNOSIS — I10 Essential (primary) hypertension: Secondary | ICD-10-CM

## 2021-08-12 HISTORY — DX: Malignant (primary) neoplasm, unspecified: C80.1

## 2021-08-12 LAB — CBC
HCT: 41 % (ref 36.0–46.0)
Hemoglobin: 13.2 g/dL (ref 12.0–15.0)
MCH: 28.8 pg (ref 26.0–34.0)
MCHC: 32.2 g/dL (ref 30.0–36.0)
MCV: 89.3 fL (ref 80.0–100.0)
Platelets: 227 10*3/uL (ref 150–400)
RBC: 4.59 MIL/uL (ref 3.87–5.11)
RDW: 13.1 % (ref 11.5–15.5)
WBC: 8.2 10*3/uL (ref 4.0–10.5)
nRBC: 0 % (ref 0.0–0.2)

## 2021-08-12 LAB — BASIC METABOLIC PANEL
Anion gap: 12 (ref 5–15)
BUN: 14 mg/dL (ref 6–20)
CO2: 29 mmol/L (ref 22–32)
Calcium: 9.8 mg/dL (ref 8.9–10.3)
Chloride: 99 mmol/L (ref 98–111)
Creatinine, Ser: 0.89 mg/dL (ref 0.44–1.00)
GFR, Estimated: >60 mL/min (ref >60)
Glucose, Bld: 92 mg/dL (ref 70–99)
Potassium: 3.5 mmol/L (ref 3.5–5.1)
Sodium: 140 mmol/L (ref 135–145)

## 2021-08-12 NOTE — Progress Notes (Signed)
COVID Vaccine Completed: Yes Date COVID Vaccine completed: 12/23/19 x 2 COVID Test: N/A vaccine manufacturer: Pfizer      PCP - Dr. Horald Pollen Cardiologist -   Chest x-ray -  EKG - 05/14/21 Stress Test -  ECHO - 07/21/19 Cardiac Cath -  Pacemaker/ICD device last checked:  Sleep Study - Yes CPAP - NO  Fasting Blood Sugar -  Checks Blood Sugar _____ times a day  Blood Thinner Instructions: Aspirin Instructions: Last Dose:  Anesthesia review: Hx: HTN,Chest pain,OSA(NO CPAP)  Patient denies shortness of breath, fever, cough and chest pain at PAT appointment   Patient verbalized understanding of instructions that were given to them at the PAT appointment. Patient was also instructed that they will need to review over the PAT instructions again at home before surgery.

## 2021-08-13 ENCOUNTER — Other Ambulatory Visit: Payer: Self-pay

## 2021-08-13 DIAGNOSIS — K862 Cyst of pancreas: Secondary | ICD-10-CM

## 2021-08-13 LAB — SURGICAL PCR SCREEN

## 2021-08-19 ENCOUNTER — Other Ambulatory Visit: Payer: Self-pay

## 2021-08-19 ENCOUNTER — Ambulatory Visit (HOSPITAL_COMMUNITY)
Admission: RE | Admit: 2021-08-19 | Discharge: 2021-08-19 | Disposition: A | Discharge status: Home / Self Care | Payer: BC Managed Care – PPO | Source: Ambulatory Visit | Attending: Internal Medicine | Admitting: Internal Medicine

## 2021-08-19 DIAGNOSIS — K862 Cyst of pancreas: Secondary | ICD-10-CM

## 2021-08-19 MED ORDER — GADOBUTROL 1 MMOL/ML IV SOLN
10.0000 mL | Freq: Once | INTRAVENOUS | Status: AC | PRN
  Administered 2021-08-19: 10 mL via INTRAVENOUS

## 2021-08-22 ENCOUNTER — Ambulatory Visit (HOSPITAL_COMMUNITY): Payer: BC Managed Care – PPO | Admitting: Physician Assistant

## 2021-08-22 ENCOUNTER — Ambulatory Visit (HOSPITAL_COMMUNITY): Payer: BC Managed Care – PPO

## 2021-08-22 ENCOUNTER — Ambulatory Visit (HOSPITAL_COMMUNITY): Payer: BC Managed Care – PPO | Admitting: Anesthesiology

## 2021-08-22 ENCOUNTER — Ambulatory Visit (HOSPITAL_COMMUNITY)
Admission: RE | Admit: 2021-08-22 | Discharge: 2021-08-22 | Disposition: A | Discharge status: Home / Self Care | Payer: BC Managed Care – PPO | Attending: Orthopedic Surgery | Admitting: Orthopedic Surgery

## 2021-08-22 ENCOUNTER — Encounter (HOSPITAL_COMMUNITY): Payer: Self-pay | Admitting: Orthopedic Surgery

## 2021-08-22 ENCOUNTER — Encounter (HOSPITAL_COMMUNITY)
Admission: RE | Disposition: A | Discharge status: Home / Self Care | Payer: Self-pay | Source: Home / Self Care | Attending: Orthopedic Surgery

## 2021-08-22 DIAGNOSIS — J449 Chronic obstructive pulmonary disease, unspecified: Secondary | ICD-10-CM | POA: Diagnosis not present

## 2021-08-22 DIAGNOSIS — K219 Gastro-esophageal reflux disease without esophagitis: Secondary | ICD-10-CM | POA: Insufficient documentation

## 2021-08-22 DIAGNOSIS — M797 Fibromyalgia: Secondary | ICD-10-CM | POA: Insufficient documentation

## 2021-08-22 DIAGNOSIS — R519 Headache, unspecified: Secondary | ICD-10-CM | POA: Diagnosis not present

## 2021-08-22 DIAGNOSIS — I209 Angina pectoris, unspecified: Secondary | ICD-10-CM | POA: Diagnosis not present

## 2021-08-22 DIAGNOSIS — M1990 Unspecified osteoarthritis, unspecified site: Secondary | ICD-10-CM | POA: Insufficient documentation

## 2021-08-22 DIAGNOSIS — Z79899 Other long term (current) drug therapy: Secondary | ICD-10-CM | POA: Diagnosis not present

## 2021-08-22 DIAGNOSIS — Z96611 Presence of right artificial shoulder joint: Secondary | ICD-10-CM

## 2021-08-22 DIAGNOSIS — I10 Essential (primary) hypertension: Secondary | ICD-10-CM | POA: Insufficient documentation

## 2021-08-22 DIAGNOSIS — M19011 Primary osteoarthritis, right shoulder: Secondary | ICD-10-CM | POA: Diagnosis not present

## 2021-08-22 DIAGNOSIS — F419 Anxiety disorder, unspecified: Secondary | ICD-10-CM | POA: Insufficient documentation

## 2021-08-22 DIAGNOSIS — R569 Unspecified convulsions: Secondary | ICD-10-CM | POA: Insufficient documentation

## 2021-08-22 DIAGNOSIS — F32A Depression, unspecified: Secondary | ICD-10-CM | POA: Diagnosis not present

## 2021-08-22 HISTORY — PX: TOTAL SHOULDER ARTHROPLASTY: SHX126

## 2021-08-22 LAB — TYPE AND SCREEN
ABO/RH(D): AB POS
Antibody Screen: NEGATIVE

## 2021-08-22 SURGERY — ARTHROPLASTY, SHOULDER, TOTAL
Anesthesia: Regional | Site: Shoulder | Laterality: Right

## 2021-08-22 MED ORDER — SUGAMMADEX SODIUM 200 MG/2ML IV SOLN
INTRAVENOUS | Status: DC | PRN
  Administered 2021-08-22: 200 mg via INTRAVENOUS

## 2021-08-22 MED ORDER — BUPIVACAINE HCL (PF) 0.5 % IJ SOLN
INTRAMUSCULAR | Status: DC | PRN
  Administered 2021-08-22: 10 mL via PERINEURAL

## 2021-08-22 MED ORDER — FENTANYL CITRATE PF 50 MCG/ML IJ SOSY
25.0000 ug | PREFILLED_SYRINGE | INTRAMUSCULAR | Status: DC | PRN
  Administered 2021-08-22 (×2): 50 ug via INTRAVENOUS

## 2021-08-22 MED ORDER — METHOCARBAMOL 500 MG IVPB - SIMPLE MED
500.0000 mg | Freq: Four times a day (QID) | INTRAVENOUS | Status: DC | PRN
  Administered 2021-08-22: 500 mg via INTRAVENOUS

## 2021-08-22 MED ORDER — MIDAZOLAM HCL 2 MG/2ML IJ SOLN
0.5000 mg | Freq: Once | INTRAMUSCULAR | Status: AC
  Administered 2021-08-22: 2 mg via INTRAVENOUS
  Filled 2021-08-22: qty 2

## 2021-08-22 MED ORDER — FENTANYL CITRATE (PF) 100 MCG/2ML IJ SOLN
INTRAMUSCULAR | Status: AC
  Filled 2021-08-22: qty 2

## 2021-08-22 MED ORDER — OXYCODONE HCL 5 MG/5ML PO SOLN: 5.0000 mg | Freq: Once | ORAL | Status: AC | PRN

## 2021-08-22 MED ORDER — ACETAMINOPHEN 160 MG/5ML PO SOLN: 325.0000 mg | ORAL | Status: DC | PRN

## 2021-08-22 MED ORDER — PROPOFOL 10 MG/ML IV BOLUS
INTRAVENOUS | Status: AC
  Filled 2021-08-22: qty 20

## 2021-08-22 MED ORDER — EPHEDRINE 5 MG/ML INJ
INTRAVENOUS | Status: AC
  Filled 2021-08-22: qty 5

## 2021-08-22 MED ORDER — LIDOCAINE 2% (20 MG/ML) 5 ML SYRINGE
INTRAMUSCULAR | Status: DC | PRN
  Administered 2021-08-22: 60 mg via INTRAVENOUS

## 2021-08-22 MED ORDER — OXYCODONE HCL 5 MG PO TABS
ORAL_TABLET | ORAL | Status: AC
  Filled 2021-08-22: qty 1

## 2021-08-22 MED ORDER — ONDANSETRON HCL 4 MG/2ML IJ SOLN
INTRAMUSCULAR | Status: AC
  Filled 2021-08-22: qty 2

## 2021-08-22 MED ORDER — ONDANSETRON HCL 4 MG/2ML IJ SOLN: 4.0000 mg | Freq: Once | INTRAMUSCULAR | Status: DC | PRN

## 2021-08-22 MED ORDER — FENTANYL CITRATE PF 50 MCG/ML IJ SOSY
PREFILLED_SYRINGE | INTRAMUSCULAR | Status: AC
  Filled 2021-08-22: qty 2

## 2021-08-22 MED ORDER — WATER FOR IRRIGATION, STERILE IR SOLN
Status: DC | PRN
  Administered 2021-08-22: 2000 mL

## 2021-08-22 MED ORDER — PHENYLEPHRINE 40 MCG/ML (10ML) SYRINGE FOR IV PUSH (FOR BLOOD PRESSURE SUPPORT)
PREFILLED_SYRINGE | INTRAVENOUS | Status: DC | PRN
  Administered 2021-08-22 (×2): 120 ug via INTRAVENOUS

## 2021-08-22 MED ORDER — PHENYLEPHRINE 40 MCG/ML (10ML) SYRINGE FOR IV PUSH (FOR BLOOD PRESSURE SUPPORT)
PREFILLED_SYRINGE | INTRAVENOUS | Status: AC
  Filled 2021-08-22: qty 20

## 2021-08-22 MED ORDER — CEFAZOLIN SODIUM-DEXTROSE 2-4 GM/100ML-% IV SOLN
2.0000 g | INTRAVENOUS | Status: AC
  Administered 2021-08-22: 2 g via INTRAVENOUS
  Filled 2021-08-22: qty 100

## 2021-08-22 MED ORDER — LACTATED RINGERS IV SOLN: INTRAVENOUS | Status: DC

## 2021-08-22 MED ORDER — FENTANYL CITRATE PF 50 MCG/ML IJ SOSY
25.0000 ug | PREFILLED_SYRINGE | Freq: Once | INTRAMUSCULAR | Status: AC
  Administered 2021-08-22: 100 ug via INTRAVENOUS
  Filled 2021-08-22: qty 2

## 2021-08-22 MED ORDER — OXYCODONE HCL 5 MG PO TABS
5.0000 mg | ORAL_TABLET | Freq: Once | ORAL | Status: AC | PRN
  Administered 2021-08-22: 5 mg via ORAL

## 2021-08-22 MED ORDER — LIDOCAINE HCL (PF) 2 % IJ SOLN
INTRAMUSCULAR | Status: AC
  Filled 2021-08-22: qty 5

## 2021-08-22 MED ORDER — ORAL CARE MOUTH RINSE: 15.0000 mL | Freq: Once | OROMUCOSAL | Status: AC

## 2021-08-22 MED ORDER — ACETAMINOPHEN 325 MG PO TABS: 325.0000 mg | ORAL_TABLET | ORAL | Status: DC | PRN

## 2021-08-22 MED ORDER — ACETAMINOPHEN 10 MG/ML IV SOLN
INTRAVENOUS | Status: AC
  Filled 2021-08-22: qty 100

## 2021-08-22 MED ORDER — PROPOFOL 10 MG/ML IV BOLUS
INTRAVENOUS | Status: DC | PRN
  Administered 2021-08-22: 150 mg via INTRAVENOUS

## 2021-08-22 MED ORDER — BUPIVACAINE LIPOSOME 1.3 % IJ SUSP
INTRAMUSCULAR | Status: DC | PRN
  Administered 2021-08-22: 10 mL via PERINEURAL

## 2021-08-22 MED ORDER — ONDANSETRON HCL 4 MG/2ML IJ SOLN
INTRAMUSCULAR | Status: DC | PRN
  Administered 2021-08-22: 4 mg via INTRAVENOUS

## 2021-08-22 MED ORDER — DEXAMETHASONE SODIUM PHOSPHATE 4 MG/ML IJ SOLN
INTRAMUSCULAR | Status: DC | PRN
Start: 2021-08-22 — End: 2021-08-22
  Administered 2021-08-22: 10 mg via INTRAVENOUS

## 2021-08-22 MED ORDER — MEPERIDINE HCL 50 MG/ML IJ SOLN: 6.2500 mg | INTRAMUSCULAR | Status: DC | PRN

## 2021-08-22 MED ORDER — TRANEXAMIC ACID-NACL 1000-0.7 MG/100ML-% IV SOLN
1000.0000 mg | INTRAVENOUS | Status: AC
  Administered 2021-08-22: 1000 mg via INTRAVENOUS
  Filled 2021-08-22: qty 100

## 2021-08-22 MED ORDER — SODIUM CHLORIDE 0.9 % IR SOLN
Status: DC | PRN
  Administered 2021-08-22 (×2): 1000 mL

## 2021-08-22 MED ORDER — DEXAMETHASONE SODIUM PHOSPHATE 10 MG/ML IJ SOLN
INTRAMUSCULAR | Status: AC
  Filled 2021-08-22: qty 1

## 2021-08-22 MED ORDER — METHOCARBAMOL 500 MG IVPB - SIMPLE MED
INTRAVENOUS | Status: AC
  Filled 2021-08-22: qty 50

## 2021-08-22 MED ORDER — ACETAMINOPHEN 10 MG/ML IV SOLN
INTRAVENOUS | Status: DC | PRN
  Administered 2021-08-22: 1000 mg via INTRAVENOUS

## 2021-08-22 MED ORDER — ROCURONIUM BROMIDE 10 MG/ML (PF) SYRINGE
PREFILLED_SYRINGE | INTRAVENOUS | Status: DC | PRN
  Administered 2021-08-22: 80 mg via INTRAVENOUS

## 2021-08-22 MED ORDER — CHLORHEXIDINE GLUCONATE 0.12 % MT SOLN
15.0000 mL | Freq: Once | OROMUCOSAL | Status: AC
  Administered 2021-08-22: 15 mL via OROMUCOSAL

## 2021-08-22 MED ORDER — ALBUTEROL SULFATE (2.5 MG/3ML) 0.083% IN NEBU
INHALATION_SOLUTION | RESPIRATORY_TRACT | Status: AC
  Administered 2021-08-22: 1.25 mg
  Filled 2021-08-22: qty 3

## 2021-08-22 MED ORDER — FENTANYL CITRATE (PF) 100 MCG/2ML IJ SOLN
INTRAMUSCULAR | Status: DC | PRN
  Administered 2021-08-22: 50 ug via INTRAVENOUS

## 2021-08-22 MED ORDER — METHOCARBAMOL 500 MG PO TABS: 500.0000 mg | ORAL_TABLET | Freq: Four times a day (QID) | ORAL | Status: DC | PRN

## 2021-08-22 SURGICAL SUPPLY — 65 items
BAG COUNTER SPONGE SURGICOUNT (BAG) IMPLANT
BAG ZIPLOCK 12X15 (MISCELLANEOUS) ×2 IMPLANT
BIT DRILL 1.6MX128 (BIT) ×2 IMPLANT
BLADE SAW SAG 73X25 THK (BLADE) ×1
BLADE SAW SGTL 73X25 THK (BLADE) ×1 IMPLANT
CEMENT BONE DEPUY (Cement) ×2 IMPLANT
COOLER ICEMAN CLASSIC (MISCELLANEOUS) ×1 IMPLANT
COVER BACK TABLE 60X90IN (DRAPES) ×2 IMPLANT
COVER SURGICAL LIGHT HANDLE (MISCELLANEOUS) ×2 IMPLANT
DRAPE INCISE IOBAN 66X45 STRL (DRAPES) ×2 IMPLANT
DRAPE ORTHO SPLIT 77X108 STRL (DRAPES) ×4
DRAPE POUCH INSTRU U-SHP 10X18 (DRAPES) ×2 IMPLANT
DRAPE SURG 17X11 SM STRL (DRAPES) ×2 IMPLANT
DRAPE SURG ORHT 6 SPLT 77X108 (DRAPES) ×2 IMPLANT
DRAPE TOP 10253 STERILE (DRAPES) ×2 IMPLANT
DRAPE U-SHAPE 47X51 STRL (DRAPES) ×3 IMPLANT
DRSG AQUACEL AG ADV 3.5X 6 (GAUZE/BANDAGES/DRESSINGS) ×2 IMPLANT
DURAPREP 26ML APPLICATOR (WOUND CARE) ×4 IMPLANT
ELECT BLADE TIP CTD 4 INCH (ELECTRODE) ×2 IMPLANT
ELECT REM PT RETURN 15FT ADLT (MISCELLANEOUS) ×2 IMPLANT
GLENOID PEG SHOULDER 40MM SML (Shoulder) IMPLANT
GLOVE SRG 8 PF TXTR STRL LF DI (GLOVE) ×1 IMPLANT
GLOVE SURG ENC MOIS LTX SZ7.5 (GLOVE) ×2 IMPLANT
GLOVE SURG POLYISO LF SZ6.5 (GLOVE) ×2 IMPLANT
GLOVE SURG UNDER POLY LF SZ6.5 (GLOVE) ×2 IMPLANT
GLOVE SURG UNDER POLY LF SZ8 (GLOVE) ×2
GOWN STRL REUS W/TWL LRG LVL3 (GOWN DISPOSABLE) ×2 IMPLANT
GOWN STRL REUS W/TWL XL LVL3 (GOWN DISPOSABLE) ×2 IMPLANT
GUIDEWIRE GLENOID 2.5X220 (WIRE) ×1 IMPLANT
HANDPIECE INTERPULSE COAX TIP (DISPOSABLE) ×2
HEAD HUMERAL HIGH OS 46X17 (Shoulder) IMPLANT
HEMOSTAT SURGICEL 2X14 (HEMOSTASIS) ×2 IMPLANT
HOOD PEEL AWAY FLYTE STAYCOOL (MISCELLANEOUS) ×6 IMPLANT
HUMERAL HEAD AEQUALIS 46X17 (Shoulder) ×2 IMPLANT
KIT BASIN OR (CUSTOM PROCEDURE TRAY) ×2 IMPLANT
KIT TURNOVER KIT A (KITS) IMPLANT
MANIFOLD NEPTUNE II (INSTRUMENTS) ×2 IMPLANT
NDL TROCAR POINT SZ 2 1/2 (NEEDLE) ×1 IMPLANT
NEEDLE TROCAR POINT SZ 2 1/2 (NEEDLE) ×2 IMPLANT
NS IRRIG 1000ML POUR BTL (IV SOLUTION) ×2 IMPLANT
PACK SHOULDER (CUSTOM PROCEDURE TRAY) ×2 IMPLANT
PAD COLD SHLDR WRAP-ON (PAD) ×1 IMPLANT
PROTECTOR NERVE ULNAR (MISCELLANEOUS) IMPLANT
RESTRAINT HEAD UNIVERSAL NS (MISCELLANEOUS) ×2 IMPLANT
RETRIEVER SUT HEWSON (MISCELLANEOUS) ×2 IMPLANT
SET HNDPC FAN SPRY TIP SCT (DISPOSABLE) ×1 IMPLANT
SHOULDER GLENOID PEG 40MM SML (Shoulder) ×2 IMPLANT
SLING ARM IMMOBILIZER LRG (SOFTGOODS) IMPLANT
SLING ARM IMMOBILIZER MED (SOFTGOODS) ×1 IMPLANT
SMARTMIX MINI TOWER (MISCELLANEOUS) ×2
SPONGE T-LAP 18X18 ~~LOC~~+RFID (SPONGE) ×2 IMPLANT
STEM HUMERAL AEQUALIS 70XS2C (Stem) ×1 IMPLANT
STRIP CLOSURE SKIN 1/2X4 (GAUZE/BANDAGES/DRESSINGS) ×3 IMPLANT
SUCTION FRAZIER HANDLE 12FR (TUBING) ×2
SUCTION TUBE FRAZIER 12FR DISP (TUBING) ×1 IMPLANT
SUPPORT WRAP ARM LG (MISCELLANEOUS) ×2 IMPLANT
SUT ETHIBOND 2 V 37 (SUTURE) ×2 IMPLANT
SUT MNCRL AB 4-0 PS2 18 (SUTURE) ×2 IMPLANT
SUT VIC AB 2-0 CT1 27 (SUTURE) ×4
SUT VIC AB 2-0 CT1 TAPERPNT 27 (SUTURE) ×2 IMPLANT
TAPE LABRALWHITE 1.5X36 (TAPE) ×2 IMPLANT
TAPE SUT LABRALTAP WHT/BLK (SUTURE) ×2 IMPLANT
TOWEL OR 17X26 10 PK STRL BLUE (TOWEL DISPOSABLE) ×2 IMPLANT
TOWER SMARTMIX MINI (MISCELLANEOUS) ×1 IMPLANT
WATER STERILE IRR 1000ML POUR (IV SOLUTION) ×2 IMPLANT

## 2021-08-22 NOTE — Anesthesia Postprocedure Evaluation (Signed)
Anesthesia Post Note  Patient: Melissa Allen  Procedure(s) Performed: TOTAL SHOULDER ARTHROPLASTY (Right: Shoulder)     Patient location during evaluation: PACU Anesthesia Type: Regional and General Level of consciousness: awake and alert Pain management: pain level controlled Vital Signs Assessment: post-procedure vital signs reviewed and stable Respiratory status: spontaneous breathing, nonlabored ventilation, respiratory function stable and patient connected to nasal cannula oxygen Cardiovascular status: blood pressure returned to baseline and stable Postop Assessment: no apparent nausea or vomiting Anesthetic complications: no   No notable events documented.  Last Vitals:  Vitals:   08/22/21 1345 08/22/21 1400  BP: (!) 142/73 131/76  Pulse: 96 93  Resp: 15 16  Temp:    SpO2: 94% 97%    Last Pain:  Vitals:   08/22/21 1400  TempSrc:   PainSc: Asleep                 Kadon Andrus

## 2021-08-22 NOTE — Op Note (Signed)
Procedure(s): TOTAL SHOULDER ARTHROPLASTY Procedure Note  Melissa Allen female 55 y.o. 08/22/2021  Preoperative diagnosis: Right shoulder end-stage osteoarthritis  Postoperative diagnosis: Same  Procedure(s) and Anesthesia Type:    * TOTAL SHOULDER ARTHROPLASTY - Choice  Surgeon(s) and Role:    Tania Ade, MD - Primary   Indications:  55 y.o. female  With endstage right shoulder arthritis. Pain and dysfunction interfered with quality of life and nonoperative treatment with activity modification, NSAIDS and injections failed.     Surgeon: Rhae Hammock   Assistants: Sheryle Hail PA-C [name] was present and scrubbed throughout the procedure and was essential in positioning, retraction, exposure, and closure)  Anesthesia: General endotracheal anesthesia with preoperative interscalene block given by the attending anesthesiologist    Procedure Detail  TOTAL SHOULDER ARTHROPLASTY  Findings: Tornier flex anatomic press-fit size 2 stem with a 46 head, cemented size small 40 Cortiloc glenoid.   A lesser tuberosity osteotomy was performed and repaired at the conclusion of the procedure.  Estimated Blood Loss:  200 mL         Drains: None   Blood Given: none          Specimens: none        Complications:  * No complications entered in OR log *         Disposition: PACU - hemodynamically stable.         Condition: stable    Procedure:   The patient was identified in the preoperative holding area where I personally marked the operative extremity after verifying with the patient and consent. She  was taken to the operating room where She was transferred to the   operative table.  The patient received an interscalene block in   the holding area by the attending anesthesiologist.  General anesthesia was induced   in the operating room without complication.  The patient did receive IV  Ancef prior to the commencement of the procedure.  The patient was   placed  in the beach-chair position with the back raised about 30   degrees.  The nonoperative extremity and head and neck were carefully   positioned and padded protecting against neurovascular compromise.  The   left upper extremity was then prepped and draped in the standard sterile   fashion.    The appropriate operative time-out was performed with   Anesthesia, the perioperative staff, as well as myself and we all agreed   that the right side was the correct operative site.  An approximately   10 cm incision was made from the tip of the coracoid to the center point of the   humerus at the level of the axilla.  Dissection was carried down sharply   through subcutaneous tissues and cephalic vein was identified and taken   laterally with the deltoid.  The pectoralis major was taken medially.  The   upper 1 cm of the pectoralis major was released from its attachment on   the humerus.  The clavipectoral fascia was incised just lateral to the   conjoined tendon.  This incision was carried up to but not into the   coracoacromial ligament.  Digital palpation was used to prove   integrity of the axillary nerve which was protected throughout the   procedure.  Musculocutaneous nerve was not palpated in the operative   field.  Conjoined tendon was then retracted gently medially and the   deltoid laterally.  Anterior circumflex humeral vessels were clamped and  coagulated.  The soft tissues overlying the biceps was incised and this   incision was carried across the transverse humeral ligament to the base   of the coracoid.  The biceps was noted to be severely degenerated. It was released from the superior labrum. The biceps was then tenodesed to the soft tissue just above   pectoralis major and the remaining portion of the biceps superiorly was   excised.  An osteotomy was performed at the lesser tuberosity.  The capsule was then   released all the way down to the 6 o'clock position of the humeral head.    The humeral head was then delivered with simultaneous adduction,   extension and external rotation.  All humeral osteophytes were removed   and the anatomic neck of the humerus was marked and cut free hand at   approximately 25 degrees retroversion within about 3 mm of the cuff   reflection posteriorly.  The head size was estimated to be a 46 medium   offset.  At that point, the humeral head was retracted posteriorly with   a Fukuda retractor.   Remaining portion of the capsule was released at the base of the   coracoid.  The remaining biceps anchor and the entire anterior-inferior   labrum was excised.  The posterior labrum was also excised but the   posterior capsule was not released.  The guidepin was placed bicortically with non elevated guide.  The reamer was used to ream to concentric bone with punctate bleeding.  This gave an excellent concentric surface.  The center hole was then drilled for an anchor peg glenoid followed by the three peripheral holes and none of the holes   exited the glenoid wall.  I then pulse irrigated these holes and dried   them with Surgicel.  The three peripheral holes were then   pressurized cemented and the anchor peg glenoid was placed and impacted   with an excellent fit.  The glenoid was a small 40 component.  The proximal humerus was then again exposed taking care not to displace the glenoid.    The entry awl was used followed by sounding reamers and then sequentially broached from size 1 to 2. This was then left in place and the calcar planer was used. Trial head was placed with a 46.  With the trial implantation of the component,  there was approximately 50% posterior translation with immediate snap back to the   anatomic position.  With forward elevation, there was no tendency   towards posterior subluxation.   The trial was removed and the final implant was prepared on a back table.  The trial was removed and the final implant was prepared on a back table.    3 small holes were drilled on the medial side of the lesser tuberosity osteotomy, through which 2 labral tapes were passed. The implant was then placed through the loop of the 2 labral tapes and impacted with an excellent press-fit. This achieved excellent anatomic reconstruction of the proximal humerus.  The joint was then copiously irrigated with pulse lavage.  The subscapularis and   lesser tuberosity osteotomy were then repaired using the 2 labral tapes previously passed in a double row fashion with horizontal mattress sutures medially brought over through bone tunnels tied over a bone bridge laterally.   One #1 Ethibond was placed at the rotator interval just above   the lesser tuberosity. Copious irrigation was used. Skin was closed with 2-0 Vicryl sutures in  the deep dermal layer and 4-0 Monocryl in a subcuticular  running fashion.  Sterile dressings were then applied including Aquacel.  The patient was placed in a sling and allowed to awaken from general anesthesia and taken to the recovery room in stable condition.      POSTOPERATIVE PLAN:  Early passive range of motion will be allowed with the goal of 0 degrees external rotation and 90 degrees forward elevation.  No internal rotation at this time.  No active motion of the arm until the lesser tuberosity heals.  The patient will be observed in the recovery room and if her pain is well controlled with the regional block and she is hemodynamically stable she can be discharged home today with family.

## 2021-08-22 NOTE — Discharge Instructions (Addendum)
Discharge Instructions after Total Shoulder Arthroplasty   A sling has been provided for you. Remove the sling 5 times each day to perform motion exercises. After the first 48 to 72 hours, discontinue using the sling. You should use the sling as a protective device, if you are in a crowd.  Use ice on the shoulder intermittently over the first 48 hours after surgery.  Take one aspirin a day for 2 weeks after surgery, unless you have an aspirin sensitivity/allergy or asthma. Leave your dressing on until your first follow up visit.  You may shower with the dressing.  Hold your arm as if you still have your sling on while you shower. If your dressing appears to be irritating you or causing a rash, give [place] a call at the office to discuss.  Active reaching and lifting are not permitted. You may use the operative arm for activities of daily living that do not require the operative arm to leave the side of the body, such as eating, drinking, bathing, etc.  Three to 5 times each day you should perform assisted overhead reaching and external rotation (outward turning) exercises with the operative arm. You were taught these exercises prior to discharge. Both exercises should be done with the non-operative arm used as the "therapist arm" while the operative arm remains relaxed. Ten of each exercise should be done three to five times each day.   Overhead reach is helping to lift your stiff arm up as high as it will go. To stretch your overhead reach, lie flat on your back, relax, and grasp the wrist of the tight shoulder with your opposite hand. Using the power in your opposite arm, bring the stiff arm up as far as it is comfortable. Start holding it for ten seconds and then work up to where you can hold it for a count of 30. Breathe slowly and deeply while the arm is moved. Repeat this stretch ten times, trying to help the ar up a little higher each time.     External rotation is turning the arm out to the side  while your elbow stays close to your body. External rotation is best stretched while you are lying on your back. Hold a cane, yardstick, broom handle, or dowel in both hands. Bend both elbows to a right angle. Use steady, gentle force from your normal arm to rotate the hand of the stiff shoulder out away from your body. Continue the rotation until it is straight in front of you holding it there for a count of 10. Do not go beyond this level of rotation until seen back by Dr. Tamera Punt. Repeat this exercise ten times slowly.      Please call [phone] during normal business hours or [phone] after hours for any problems. Including the following:  - excessive redness of the incisions - drainage for more than 4 days - fever of more than 101.5 F  *Please note that pain medications will not be refilled after hours or on weekends.   Dental Antibiotics:  In most cases prophylactic antibiotics for Dental procdeures after total joint surgery are not necessary.  Exceptions are as follows:  1. History of prior total joint infection  2. Severely immunocompromised (Organ Transplant, cancer chemotherapy, Rheumatoid biologic meds such as [place])  3. Poorly controlled diabetes (A1C &gt; 8.0, blood glucose over 200)  If you have one of these conditions, contact your surgeon for an antibiotic prescription, prior to your dental procedure.

## 2021-08-22 NOTE — Anesthesia Procedure Notes (Signed)
Procedure Name: Intubation Date/Time: 08/22/2021 11:30 AM Performed by: Claudia Desanctis, CRNA Pre-anesthesia Checklist: Patient identified, Emergency Drugs available, Suction available and Patient being monitored Patient Re-evaluated:Patient Re-evaluated prior to induction Oxygen Delivery Method: Circle system utilized Preoxygenation: Pre-oxygenation with 100% oxygen Induction Type: IV induction Ventilation: Mask ventilation without difficulty Laryngoscope Size: Glidescope and 3 Grade View: Grade I Tube type: Oral Tube size: 7.0 mm Number of attempts: 1 Airway Equipment and Method: Stylet Placement Confirmation: ETT inserted through vocal cords under direct vision, positive ETCO2 and breath sounds checked- equal and bilateral Secured at: 22 cm Tube secured with: Tape Dental Injury: Teeth and Oropharynx as per pre-operative assessment  Difficulty Due To: Difficulty was anticipated, Difficult Airway- due to reduced neck mobility, Difficult Airway- due to anterior larynx, Difficult Airway- due to limited oral opening and Difficult Airway- due to dentition Comments: Glidescope used due to past history of difficult intubation plus clinical exam

## 2021-08-22 NOTE — Progress Notes (Signed)
AssistedDr. Ambrose Pancoast with right, ultrasound guided, interscalene  block. Side rails up, monitors on throughout procedure. See vital signs in flow sheet. Tolerated Procedure well.

## 2021-08-22 NOTE — H&P (Signed)
Melissa Allen is an 55 y.o. female.   Chief Complaint: Right shoulder pain and dysfunction HPI: Advanced right shoulder arthritis, failed extensive conservative management including arthroscopic debridement and multiple injections.  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, right    Family history of breast cancer    Family history of colon cancer    Family history of ovarian cancer    Family history of pancreatic cancer    Fibromyalgia    History of kidney stones    Hypertension    Obesity (BMI 30.0-34.9)    Pancreatitis 1986   PONV (postoperative nausea and vomiting)    Poor venous access    hard to start iv-   Seizures ([place]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, was treated /w phenobarbital for a while, then taken off - 02/24/2020 occurred 30 years ago per patient   Shortness of breath dyspnea    Wears glasses     Past Surgical History:  Procedure Laterality Date   ABDOMINAL HYSTERECTOMY     ANTERIOR LAT LUMBAR FUSION Left 08/17/2018   Procedure: LUMBAR THREE-FOUR ANTERIOR LATERAL INTERBODY FUSION;  Surgeon: Earnie Larsson, MD;  Location: [facility];  Service: Neurosurgery;  Laterality: Left;   APPENDECTOMY  08/05/2017   BLADDER SUSPENSION  03/01/2012   Procedure: TRANSVAGINAL TAPE (TVT) PROCEDURE;  Surgeon: Emily Filbert, MD;  Location: [facility];  Service: Gynecology;  Laterality: N/A;   BREAST RECONSTRUCTION WITH PLACEMENT OF TISSUE EXPANDER AND ALLODERM Bilateral 10/19/2020   Procedure: BILATERAL BREAST RECONSTRUCTION WITH PLACEMENT OF TISSUE EXPANDER AND ALLODERM;  Surgeon: Irene Limbo, MD;  Location: [facility];  Service: Plastics;  Laterality: Bilateral;   BUNIONECTOMY  1995    Right   CHOLECYSTECTOMY  late 1980's       CYSTOSCOPY  03/01/2012   Procedure: CYSTOSCOPY;  Surgeon: Emily Filbert, MD;  Location: [facility];  Service: Gynecology;  Laterality: N/A;   CYSTOSCOPY W/ URETERAL STENT PLACEMENT Left 12/15/2014   Procedure: CYSTOSCOPY, LEFT URETEROSCOPYU WITH STONE EXTRACTION;  Surgeon: Irine Seal, MD;  Location: [facility];  Service: Urology;  Laterality: Left;   CYSTOSCOPY W/ URETERAL STENT PLACEMENT Left 10/02/2016   Procedure: CYSTOSCOPY WITH RETROGRADE PYELOGRAM/URETERAL STENT PLACEMENT ureteroscopy, basket extraction;  Surgeon: Cleon Gustin, MD;  Location: [facility];  Service: Urology;  Laterality: Left;   HARDWARE REMOVAL N/A 12/22/2016   Procedure: Lumbar Four-Five Removal of Hardware;  Surgeon: Earnie Larsson, MD;  Location: [facility];  Service: Neurosurgery;  Laterality: N/A;   INCISION AND DRAINAGE ABSCESS Left 07/19/2013   Procedure: INCISION AND DRAINAGE LEFT LOWER EXTERMITY HEMATOMA;  Surgeon: Imogene Burn. Georgette Dover, MD;  Location: [facility];  Service: General;  Laterality: Left;  Excision left lower leg mass   KNEE CARTILAGE SURGERY     left   KNEE SURGERY Left    x 2, Murphy/Sue, corrected Patella displacement    LAMINECTOMY     LAPAROSCOPIC APPENDECTOMY N/A 08/05/2017   Procedure: APPENDECTOMY LAPAROSCOPIC;  Surgeon: Kieth Brightly Arta Bruce, MD;  Location: [facility];  Service: General;  Laterality: N/A;   LUMBAR LAMINECTOMY  10/06   L4-5,  Ray   LUMBAR PERCUTANEOUS PEDICLE SCREW 1 LEVEL Left 08/17/2018   Procedure: LUMBAR THREE-FOUR LUMBAR PERCUTANEOUS PEDICLE SCREW;  Surgeon: Earnie Larsson, MD;  Location: [facility];  Service: Neurosurgery;  Laterality: Left;   MASTECTOMY W/ SENTINEL NODE BIOPSY Bilateral 10/19/2020   Procedure: BILATERAL MASTECTOMY WITH LEFT SENTINEL LYMPH NODE BIOPSY;  Surgeon: Jovita Kussmaul, MD;  Location: [facility];  Service: General;  Laterality: Bilateral;   POSTERIOR CERVICAL LAMINECTOMY Right 02/28/2020   Procedure: Right  Cervical Six-Seven Laminectomy and Foraminotomy;  Surgeon: Earnie Larsson, MD;  Location: [facility];  Service: Neurosurgery;  Laterality: Right;  3C   R compressed pronator Right    Sypher, R arm   REMOVAL OF BILATERAL TISSUE EXPANDERS WITH PLACEMENT OF BILATERAL BREAST IMPLANTS Bilateral 01/28/2021   Procedure: REMOVAL OF BILATERAL TISSUE EXPANDERS WITH PLACEMENT OF BILATERAL BREAST IMPLANTS WITH ACELLULAR DERMIS;  Surgeon: Irene Limbo, MD;  Location: [facility];  Service: Plastics;  Laterality: Bilateral;   RESECTION DISTAL CLAVICAL Right 06/30/2017   Procedure: RIGHT RESECTION DISTAL CLAVICAL;  Surgeon: Melrose Nakayama, MD;  Location: [facility];  Service: Orthopedics;  Laterality: Right;   SHOULDER ACROMIOPLASTY Right 06/30/2017   Procedure: RIGHT SHOULDER ACROMIOPLASTY;  Surgeon: Melrose Nakayama, MD;  Location: [facility];  Service: Orthopedics;  Laterality: Right;   SHOULDER ARTHROSCOPY Right 06/30/2017   Procedure: ARTHROSCOPY DEBRIDEMENT RIGHT SHOULDER;  Surgeon: Melrose Nakayama, MD;  Location: [facility];  Service: Orthopedics;  Laterality: Right;   SPINAL FUSION     TENDON REPAIR  2013   TENDON REPAIR Right 02/01/12   TONSILLECTOMY  age 18   TONSILLECTOMY      Family History  Problem Relation Age of Onset   Cancer Mother        pancreatic cancer   Heart disease Father 15   Colon polyps Father    Colon cancer Paternal Grandfather        dx < 4   Breast cancer Maternal Grandmother    Ovarian cancer Maternal Grandmother 79   Uterine cancer Other        Grandmother   Breast cancer Other        Grandmother   Ovarian cancer Other        Grandmother   Diabetes Other        mother   Other Neg Hx    Social History:  reports that she has never smoked. She has never used smokeless tobacco. She reports that she does not currently use alcohol. She reports that she does not use drugs.  Allergies:  Allergies  Allergen Reactions   Chlorquinaldol Rash    11/30/19 patch testing:  positive reaction to Quinoline Mix (contains clioquinol and chlorquinaldol)   Clioquinol Rash    11/30/19 patch testing: positive reaction to Quinoline Mix (contains clioquinol and chlorquinaldol)    Other Anaphylaxis    Pt has anaphylactic reaction to something during procedure - unsure of what   Per patient, Quinoline Mix  - based on allergy testing   Latex Itching, Swelling and Dermatitis    SWELLING FROM FACE MASK RING AFTER LAST SURGERY    Lisinopril Cough        Codeine Nausea And Vomiting   Hydrocodone Itching   Tape Rash    TOLERATES PAPER TAPE ONLY NO PINK SURGICAL TAPE EITHER, EKG Leads    Wound Dressing Adhesive Rash    Dermabond    Medications Prior to Admission  Medication Sig Dispense Refill   albuterol (PROAIR HFA)  108 (90 Base) MCG/ACT inhaler INHALE TWO PUFFS EVERY FOUR HOURS AS NEEDED SHORTNESS OF BREATH 8.5 g 2   doxepin (SINEQUAN) 10 MG capsule Take 30 mg by mouth at bedtime.      DULoxetine (CYMBALTA) 30 MG capsule Take 90 mg by mouth at bedtime.     estradiol (ESTRACE) 1 MG tablet TAKE 1 TABLET BY MOUTH ONCE A DAY 90 tablet 3   fluticasone (FLONASE) 50 MCG/ACT nasal spray USE 2 SPRAYS INTO EACH NOSTRIL ONCE DAILY AS DIRECTED 16 g 4   hydrOXYzine (ATARAX/VISTARIL) 25 MG tablet Take 25 mg by mouth 3 (three) times daily as needed for anxiety.     losartan (COZAAR) 50 MG tablet Take 1 tablet (50 mg total) by mouth daily. (Patient taking differently: Take 50 mg by mouth in the morning and at bedtime.) 90 tablet 0   NUCYNTA ER 150 MG TB12 Take 150 mg by mouth every 12 (twelve) hours.     QUEtiapine (SEROQUEL) 200 MG tablet Take 200 mg by mouth at bedtime.     RESTASIS 0.05 % ophthalmic emulsion Place 1 drop into both eyes 3 (three) times daily.      tizanidine (ZANAFLEX) 2 MG capsule Take 2 mg by mouth 3 (three) times daily.     triamterene-hydrochlorothiazide (MAXZIDE-25) 37.5-25 MG tablet TAKE 1 TABLET BY MOUTH ONCE A DAY 90 tablet 0   EPINEPHrine 0.3 mg/0.3 mL IJ  SOAJ injection Inject 0.3 mLs (0.3 mg total) into the muscle as needed for anaphylaxis. 1 each 1   sulfamethoxazole-trimethoprim (BACTRIM DS) 800-160 MG tablet Take 1 tablet by mouth 2 (two) times daily. (Patient not taking: Reported on 08/13/2021) 14 tablet 0    No results found for this or any previous visit (from the past 48 hour(s)). No results found.  Review of Systems  All other systems reviewed and are negative.  Blood pressure (!) 130/97, pulse 95, temperature 97.9 F (36.6 C), temperature source Oral, resp. rate 18, last menstrual period 12/21/2011, SpO2 97 %. Physical Exam HENT:     Head: Atraumatic.  Eyes:     Extraocular Movements: Extraocular movements intact.  Cardiovascular:     Pulses: Normal pulses.  Pulmonary:     Effort: Pulmonary effort is normal.  Musculoskeletal:     Comments: Right shoulder pain with limited range of motion  Skin:    General: Skin is warm.  Neurological:     Mental Status: She is alert.  Psychiatric:        Mood and Affect: Mood normal.     Assessment/Plan Advanced right shoulder arthritis, failed extensive conservative management including arthroscopic debridement and multiple injections. Plan right total shoulder replacement Risks / benefits of surgery discussed Consent on chart  NPO for OR Preop antibiotics   Rhae Hammock, MD 08/22/2021, 10:31 AM

## 2021-08-22 NOTE — Anesthesia Procedure Notes (Signed)
Anesthesia Regional Block: Interscalene brachial plexus block   Pre-Anesthetic Checklist: , timeout performed,  Correct Patient, Correct Site, Correct Laterality,  Correct Procedure, Correct Position, site marked,  Risks and benefits discussed,  Surgical consent,  Pre-op evaluation,  At surgeon's request and post-op pain management  Laterality: Right  Prep: chloraprep       Needles:  Injection technique: Single-shot  Needle Type: Echogenic Stimulator Needle     Needle Length: 5cm  Needle Gauge: 22     Additional Needles:   Procedures:, nerve stimulator,,, ultrasound used (permanent image in chart),,     Nerve Stimulator or Paresthesia:  Response: hand, 0.45 mA  Additional Responses:   Narrative:  Start time: 08/22/2021 10:50 AM End time: 08/22/2021 10:55 AM Injection made incrementally with aspirations every 5 mL.  Performed by: Personally  Anesthesiologist: Janeece Riggers, MD  Additional Notes: Functioning IV was confirmed and monitors were applied.  A 57mm 22ga Arrow echogenic stimulator needle was used. Sterile prep and drape,hand hygiene and sterile gloves were used. Ultrasound guidance: relevant anatomy identified, needle position confirmed, local anesthetic spread visualized around nerve(s)., vascular puncture avoided.  Image printed for medical record. Negative aspiration and negative test dose prior to incremental administration of local anesthetic. The patient tolerated the procedure well.

## 2021-08-22 NOTE — Transfer of Care (Signed)
Immediate Anesthesia Transfer of Care Note  Patient: Melissa Allen  Procedure(s) Performed: TOTAL SHOULDER ARTHROPLASTY (Right: Shoulder)  Patient Location: PACU  Anesthesia Type:General  Level of Consciousness: awake and patient cooperative  Airway & Oxygen Therapy: Patient Spontanous Breathing and Patient connected to face mask  Post-op Assessment: Report given to RN and Post -op Vital signs reviewed and stable  Post vital signs: Reviewed and stable  Last Vitals:  Vitals Value Taken Time  BP 140/82 08/22/21 1310  Temp 36.8 C 08/22/21 1310  Pulse 96 08/22/21 1314  Resp 14 08/22/21 1314  SpO2 97 % 08/22/21 1314  Vitals shown include unvalidated device data.  Last Pain:  Vitals:   08/22/21 1310  TempSrc:   PainSc: Asleep         Complications: No notable events documented.

## 2021-08-22 NOTE — Evaluation (Signed)
Occupational Therapy Evaluation Patient Details Name: Melissa Allen MRN: [id number] DOB: Jan 12, 1966 Today's Date: 08/22/2021   History of Present Illness Patient s/p r TSA   Clinical Impression   [name] is a 55 year old woman s/p total shoulder replacement without functional use of right dominant upper extremity secondary to effects of surgery and interscalene block and shoulder precautions. Therapist provided education and instruction to patient and spouse in regards to exercises, precautions, positioning, donning upper extremity clothing and bathing while maintaining shoulder precautions, ice and edema management using cuff and cooler and donning/doffing sling. Patient and spouse verbalized understanding and demonstrated as needed. Patient needed assistance to donn shirt, underwear, and pants. Patient and spouse provided with instruction on compensatory strategies to perform ADLs - patient had researched compensatory strategies prior to surgery. Handouts provided to maximize retention. Patient to follow up with MD for further therapy needs.        Recommendations for follow up therapy are one component of a multi-disciplinary discharge planning process, led by the attending physician.  Recommendations may be updated based on patient status, additional functional criteria and insurance authorization.   Follow Up Recommendations  Follow physician's recommendations for discharge plan and follow up therapies    Assistance Recommended at Discharge Intermittent Supervision/Assistance  Functional Status Assessment  Patient has had a recent decline in their functional status and demonstrates the ability to make significant improvements in function in a reasonable and predictable amount of time.  Equipment Recommendations  None recommended by OT    Recommendations for Other Services       Precautions / Restrictions Precautions Precautions: Shoulder Type of Shoulder Precautions: No  active ROM, PROM to 90 defrees FF and 0 degrees external rotation per MD's note Shoulder Interventions: Shoulder sling/immobilizer;At all times;Off for dressing/bathing/exercises Precaution Booklet Issued:  (handouts provided) Required Braces or Orthoses: Sling Restrictions Weight Bearing Restrictions: Yes RUE Weight Bearing: Non weight bearing          Vision Patient Visual Report: No change from baseline              Pertinent Vitals/Pain Pain Assessment: Faces Faces Pain Scale: Hurts little more Pain Location: axilla area Pain Descriptors / Indicators: Grimacing;Sore Pain Intervention(s): Limited activity within patient's tolerance;Monitored during session     Hand Dominance     Extremity/Trunk Assessment Upper Extremity Assessment Upper Extremity Assessment: RUE deficits/detail RUE Deficits / Details: Impaired by effects of interscalene block   Lower Extremity Assessment Lower Extremity Assessment: Overall WFL for tasks assessed   Cervical / Trunk Assessment Cervical / Trunk Assessment: Normal   Communication     Cognition Arousal/Alertness: Awake/alert Behavior During Therapy: WFL for tasks assessed/performed Overall Cognitive Status: Within Functional Limits for tasks assessed                                       General Comments       Exercises     Shoulder Instructions Shoulder Instructions Donning/doffing shirt without moving shoulder: Patient able to independently direct caregiver Method for sponge bathing under operated UE: Patient able to independently direct caregiver Donning/doffing sling/immobilizer: Independent Correct positioning of sling/immobilizer: Independent ROM for elbow, wrist and digits of operated UE: Independent Sling wearing schedule (on at all times/off for ADL's): Independent Proper positioning of operated UE when showering: Independent Dressing change: Independent Positioning of UE while sleeping:  [place]  Family/patient expects to be discharged to:: Private residence Living Arrangements: Spouse/significant other                                              OT Problem List: Decreased strength;Decreased range of motion;Pain;Impaired UE functional use      OT Treatment/Interventions:      OT Goals(Current goals can be found in the care plan section) Acute Rehab OT Goals OT Goal Formulation: All assessment and education complete, DC therapy  OT Frequency:     Barriers to D/C:            Co-evaluation              AM-PAC OT "6 Clicks" Daily Activity     Outcome Measure Help from another person eating meals?: A Little Help from another person taking care of personal grooming?: A Little Help from another person toileting, which includes using toliet, bedpan, or urinal?: A Little Help from another person bathing (including washing, rinsing, drying)?: A Little Help from another person to put on and taking off regular upper body clothing?: A Lot Help from another person to put on and taking off regular lower body clothing?: A Lot 6 Click Score: 16   End of Session Nurse Communication:  (OT education complete)  Activity Tolerance: Patient tolerated treatment well Patient left: in chair;with family/visitor present  OT Visit Diagnosis: Muscle weakness (generalized) (M62.81);Pain Pain - Right/Left: Right Pain - part of body: Shoulder                Time: 5176-1607 OT Time Calculation (min): 23 min Charges:  OT General Charges $OT Visit: 1 Visit OT Evaluation $OT Eval Low Complexity: 1 Low OT Treatments $Self Care/Home Management : 8-22 mins Yerania Chamorro, OTR/L [place] [phone] Pager: [place] 08/22/2021, 3:44 PM

## 2021-08-22 NOTE — Anesthesia Preprocedure Evaluation (Signed)
Anesthesia Evaluation  Patient identified by MRN, date of birth, ID band Patient awake    Reviewed: Allergy & Precautions, NPO status , Patient's Chart, lab work & pertinent test results  History of Anesthesia Complications (+) PONV and history of anesthetic complications  Airway Mallampati: IV  TM Distance: >3 FB Neck ROM: Limited  Mouth opening: Limited Mouth Opening  Dental  (+) Teeth Intact, Dental Advisory Given   Pulmonary asthma , COPD,  COPD inhaler,    Pulmonary exam normal breath sounds clear to auscultation       Cardiovascular hypertension, Pt. on medications + angina Normal cardiovascular exam Rhythm:Regular Rate:Normal     Neuro/Psych  Headaches, Seizures - (not on anti-epileptics), Well Controlled,  PSYCHIATRIC DISORDERS Anxiety Depression    GI/Hepatic Neg liver ROS, GERD  ,  Endo/Other  negative endocrine ROS  Renal/GU negative Renal ROS  negative genitourinary   Musculoskeletal  (+) Arthritis , Fibromyalgia -  Abdominal   Peds  Hematology negative hematology ROS (+)   Anesthesia Other Findings   Reproductive/Obstetrics                             Anesthesia Physical  Anesthesia Plan  ASA: 3  Anesthesia Plan: General and Regional   Post-op Pain Management:    Induction: Intravenous  PONV Risk Score and Plan: 4 or greater and Midazolam, Dexamethasone and Ondansetron  Airway Management Planned: Oral ETT and Video Laryngoscope Planned  Additional Equipment: None  Intra-op Plan:   Post-operative Plan: Extubation in OR  Informed Consent: I have reviewed the patients History and Physical, chart, labs and discussed the procedure including the risks, benefits and alternatives for the proposed anesthesia with the patient or authorized representative who has indicated his/her understanding and acceptance.     Dental advisory given  Plan Discussed with: CRNA and  Anesthesiologist  Anesthesia Plan Comments:         Anesthesia Quick Evaluation

## 2021-08-23 ENCOUNTER — Encounter (HOSPITAL_COMMUNITY): Payer: Self-pay | Admitting: Orthopedic Surgery

## 2021-08-26 ENCOUNTER — Emergency Department (HOSPITAL_COMMUNITY)
Admission: EM | Admit: 2021-08-26 | Discharge: 2021-08-26 | Disposition: A | Discharge status: Home / Self Care | Payer: BC Managed Care – PPO | Attending: Emergency Medicine | Admitting: Emergency Medicine

## 2021-08-26 ENCOUNTER — Encounter (HOSPITAL_COMMUNITY): Payer: Self-pay | Admitting: Emergency Medicine

## 2021-08-26 ENCOUNTER — Emergency Department (HOSPITAL_COMMUNITY): Payer: BC Managed Care – PPO

## 2021-08-26 DIAGNOSIS — I10 Essential (primary) hypertension: Secondary | ICD-10-CM | POA: Diagnosis not present

## 2021-08-26 DIAGNOSIS — J181 Lobar pneumonia, unspecified organism: Secondary | ICD-10-CM | POA: Insufficient documentation

## 2021-08-26 DIAGNOSIS — Z96651 Presence of right artificial knee joint: Secondary | ICD-10-CM | POA: Diagnosis not present

## 2021-08-26 DIAGNOSIS — Z7951 Long term (current) use of inhaled steroids: Secondary | ICD-10-CM | POA: Diagnosis not present

## 2021-08-26 DIAGNOSIS — J45909 Unspecified asthma, uncomplicated: Secondary | ICD-10-CM | POA: Insufficient documentation

## 2021-08-26 DIAGNOSIS — Z85828 Personal history of other malignant neoplasm of skin: Secondary | ICD-10-CM | POA: Insufficient documentation

## 2021-08-26 DIAGNOSIS — R0602 Shortness of breath: Secondary | ICD-10-CM | POA: Diagnosis present

## 2021-08-26 DIAGNOSIS — Z79899 Other long term (current) drug therapy: Secondary | ICD-10-CM | POA: Diagnosis not present

## 2021-08-26 DIAGNOSIS — Z91040 Latex allergy status: Secondary | ICD-10-CM | POA: Insufficient documentation

## 2021-08-26 DIAGNOSIS — J189 Pneumonia, unspecified organism: Secondary | ICD-10-CM

## 2021-08-26 LAB — CBC WITH DIFFERENTIAL/PLATELET
Abs Immature Granulocytes: 0.04 10*3/uL (ref 0.00–0.07)
Basophils Absolute: 0.1 10*3/uL (ref 0.0–0.1)
Basophils Relative: 1 %
Eosinophils Absolute: 0.3 10*3/uL (ref 0.0–0.5)
Eosinophils Relative: 3 %
HCT: 35.4 % — ABNORMAL LOW (ref 36.0–46.0)
Hemoglobin: 11.6 g/dL — ABNORMAL LOW (ref 12.0–15.0)
Immature Granulocytes: 1 %
Lymphocytes Relative: 32 %
Lymphs Abs: 2.7 10*3/uL (ref 0.7–4.0)
MCH: 28.2 pg (ref 26.0–34.0)
MCHC: 32.8 g/dL (ref 30.0–36.0)
MCV: 85.9 fL (ref 80.0–100.0)
Monocytes Absolute: 0.6 10*3/uL (ref 0.1–1.0)
Monocytes Relative: 7 %
Neutro Abs: 4.7 10*3/uL (ref 1.7–7.7)
Neutrophils Relative %: 56 %
Platelets: 281 10*3/uL (ref 150–400)
RBC: 4.12 MIL/uL (ref 3.87–5.11)
RDW: 13.2 % (ref 11.5–15.5)
WBC: 8.3 10*3/uL (ref 4.0–10.5)
nRBC: 0 % (ref 0.0–0.2)

## 2021-08-26 LAB — BASIC METABOLIC PANEL
Anion gap: 12 (ref 5–15)
BUN: 10 mg/dL (ref 6–20)
CO2: 25 mmol/L (ref 22–32)
Calcium: 9.5 mg/dL (ref 8.9–10.3)
Chloride: 100 mmol/L (ref 98–111)
Creatinine, Ser: 0.7 mg/dL (ref 0.44–1.00)
GFR, Estimated: >60 mL/min (ref >60)
Glucose, Bld: 101 mg/dL — ABNORMAL HIGH (ref 70–99)
Potassium: 2.9 mmol/L — ABNORMAL LOW (ref 3.5–5.1)
Sodium: 137 mmol/L (ref 135–145)

## 2021-08-26 LAB — BRAIN NATRIURETIC PEPTIDE: B Natriuretic Peptide: 68.1 pg/mL (ref 0.0–100.0)

## 2021-08-26 MED ORDER — DOXYCYCLINE HYCLATE 100 MG PO TABS
100.0000 mg | ORAL_TABLET | Freq: Once | ORAL | Status: AC
  Administered 2021-08-26: 19:00:00 100 mg via ORAL
  Filled 2021-08-26: qty 1

## 2021-08-26 MED ORDER — ONDANSETRON 4 MG PO TBDP
4.0000 mg | ORAL_TABLET | Freq: Once | ORAL | Status: DC
  Filled 2021-08-26: qty 1

## 2021-08-26 MED ORDER — DOXYCYCLINE HYCLATE 100 MG PO CAPS: 100.0000 mg | ORAL_CAPSULE | Freq: Two times a day (BID) | ORAL | 0 refills | Status: AC

## 2021-08-26 MED ORDER — IOHEXOL 350 MG/ML SOLN
75.0000 mL | Freq: Once | INTRAVENOUS | Status: AC | PRN
  Administered 2021-08-26: 15:00:00 75 mL via INTRAVENOUS

## 2021-08-26 MED ORDER — OXYCODONE-ACETAMINOPHEN 5-325 MG PO TABS
2.0000 | ORAL_TABLET | Freq: Once | ORAL | Status: DC
  Filled 2021-08-26: qty 2

## 2021-08-26 NOTE — ED Notes (Addendum)
Pt felt short of breath like "If she had run a mile" after 50" walk . Oxygen saturation remained above 96% the whole time.
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
## 2021-08-26 NOTE — ED Provider Notes (Signed)
Emergency Medicine Provider Triage Evaluation Note  Melissa Allen , a 55 y.o. female  was evaluated in triage.  Pt complains of sob. Recent shoulder replacement.  Patient was at her doctor's office this morning and found to have oxygen saturations in the low 90s.  She has been having some bibasilar wheezing, improved with oxygen supplementation and albuterol prior to arrival.  She does have recent URI just before Thanksgiving and has not fully recovered. Patient had productive cough with hemoptysis 2 days ago. C/o chest pain radiating into back   Review of Systems  Positive: sob Negative: fevers  Physical Exam  BP (!) 158/79 (BP Location: Right Arm)    Pulse 76    Temp 98.2 F (36.8 C)    Resp 18    SpO2 97%  Gen:   Awake, no distress   Resp:  Normal effort  MSK:   Moves extremities without difficulty  Other:    Medical Decision Making  Medically screening exam initiated at 1:16 PM.  Appropriate orders placed.  Jeanann Lewandowsky was informed that the remainder of the evaluation will be completed by another provider, this initial triage assessment does not replace that evaluation, and the importance of remaining in the ED until their evaluation is complete.  55 year old female here with shortness of breath, recent surgery.  Given these facts I will not order a D-dimer as it will likely be positive but I have ordered a CTA to rule out pulmonary embolus.  She is markedly tachypneic.  I do not hear wheezing in her chest.  I have low suspicion for dissection.   Margarita Mail, PA-C 08/26/21 1333    Regan Lemming, MD 08/26/21 [phone]

## 2021-08-26 NOTE — ED Notes (Signed)
Pt NAD, a/ox4. Pt verbalizes understanding of all DC and f/u instructions. All questions answered. Pt wheeled to lobby in w/c by spouse.

## 2021-08-26 NOTE — ED Triage Notes (Signed)
Patient BIB [facility] from PCP office for complaint of shortness of breath since Saturday, reports shoulder surgery on Thursday. Patient alert, oriented, tachypneic, and ill-appearing in triage. 1x Albuterol given by EMS PTA.   EMS  97% room SpO2, wearing 4L O2 for comfort

## 2021-08-26 NOTE — ED Notes (Signed)
Pt mild distress and ill appearing, on [place] O2 sitting in w/c in room 21. A/ox4, speaking 4-5 word sentences, states she had a total right shoulder procedure done last week and has since developed SOB and chest tightness. Pt reports orthopnea, exertional dyspnea and hemoptosis along with CP/tightness that radiates to back. LS more dim on right side than left. +pmsc.

## 2021-09-03 IMAGING — MR MR LUMBAR SPINE WO/W CM
4 of 8 series · 21 of 48 positions shown · IV contrast (18ml Multihance)
Comparison: 06/19/2018

CLINICAL DATA: Bilateral leg and hip pain. Fall in driveway 4-6
months ago

EXAM:
MRI LUMBAR SPINE WITHOUT AND WITH CONTRAST
TECHNIQUE: Multiplanar and multiecho pulse sequences of the lumbar spine were
obtained without and with intravenous contrast.
CONTRAST:  18mL MULTIHANCE GADOBENATE DIMEGLUMINE 529 MG/ML IV SOLN

[Series 5: T1 · sagittal · 4.0mm · 0.73mm/px · 4 of 15 slices shown (1 of 2)]
[im 1/15]
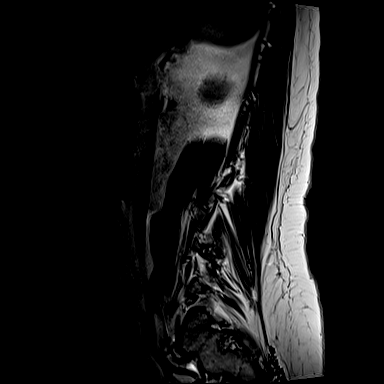
[im 5/15]
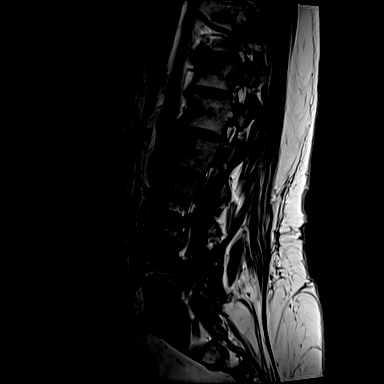
[im 10/15]
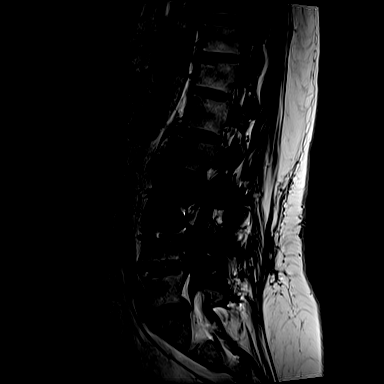
[im 15/15]
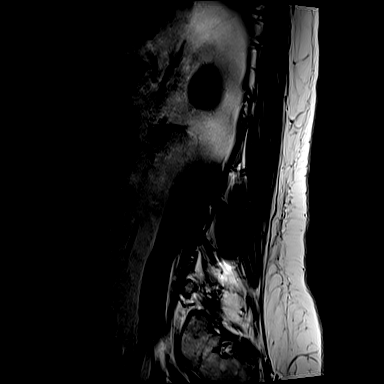

[Series 9: T1 · axial · 4.0mm · 0.35mm/px · z∈[-97,+107]mm · 6 of 44 slices shown (2 of 2)]
[im 1/44]
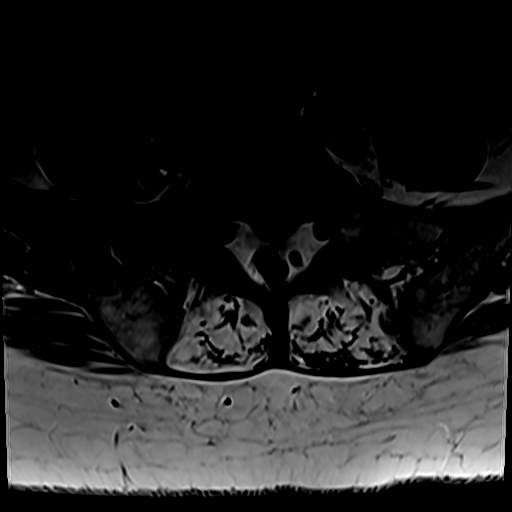
[im 5/44]
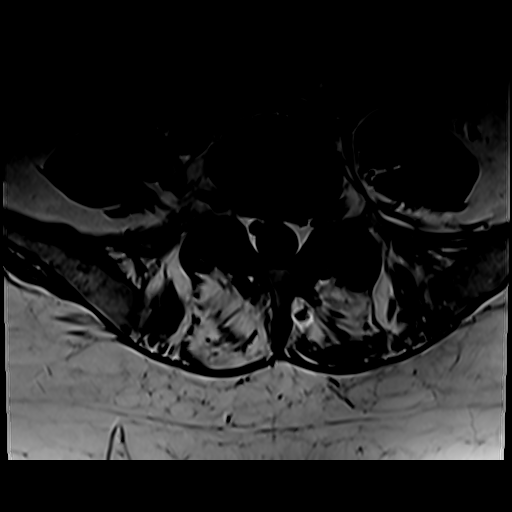
[im 15/44]
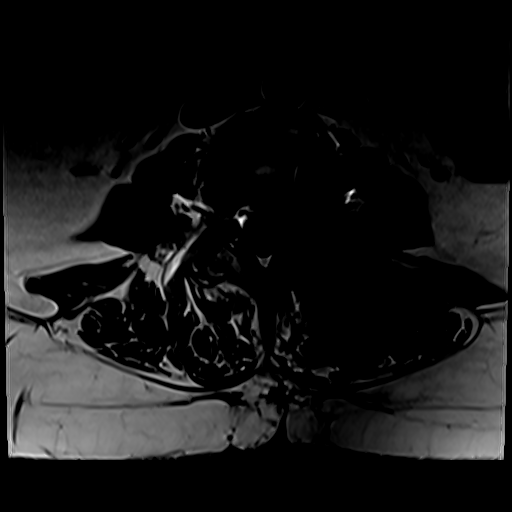
[im 20/44]
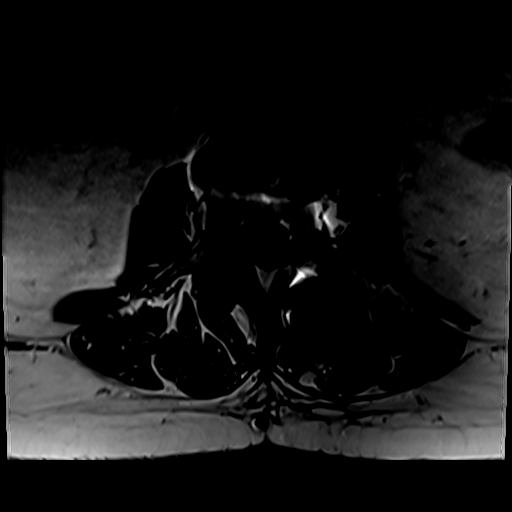
[im 24/44]
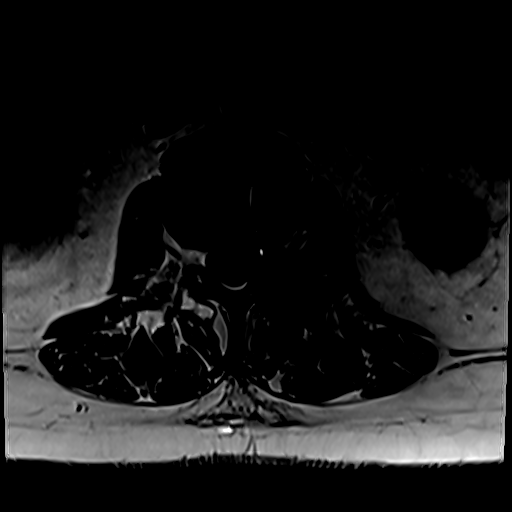
[im 39/44]
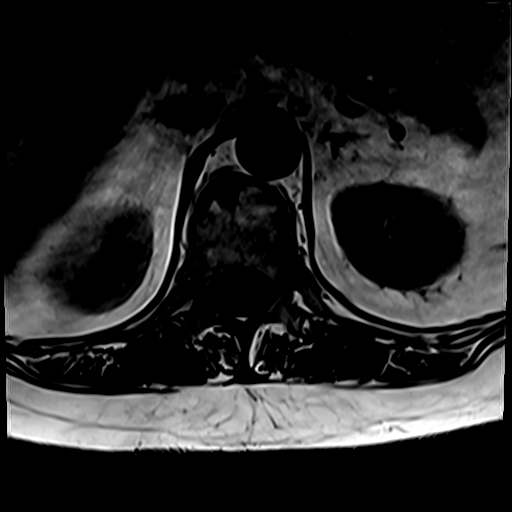

[Series 12: T2 · axial · 4.0mm · 0.35mm/px · z∈[-97,+131]mm · 8 of 44 slices shown (1 of 2)]
[im 1/44]
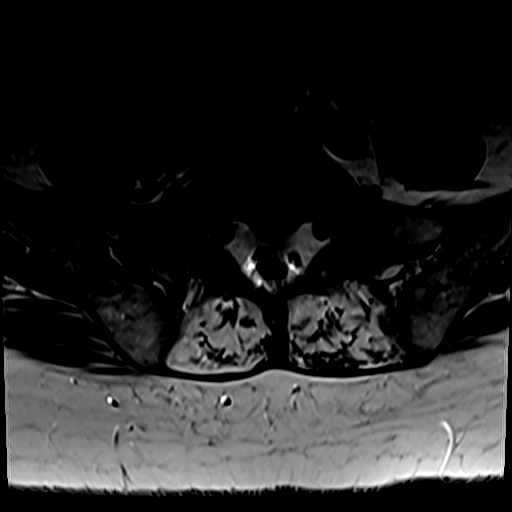
[im 5/44]
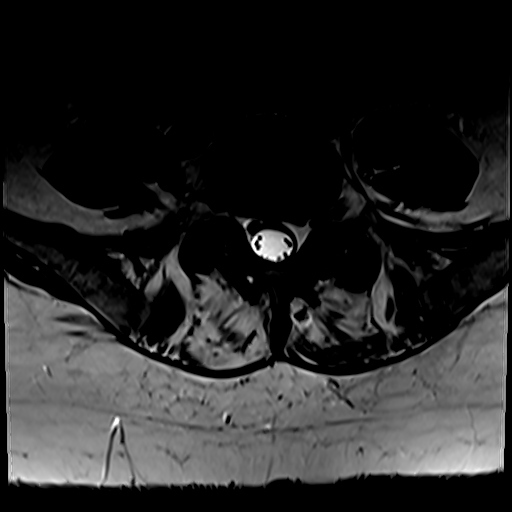
[im 15/44]
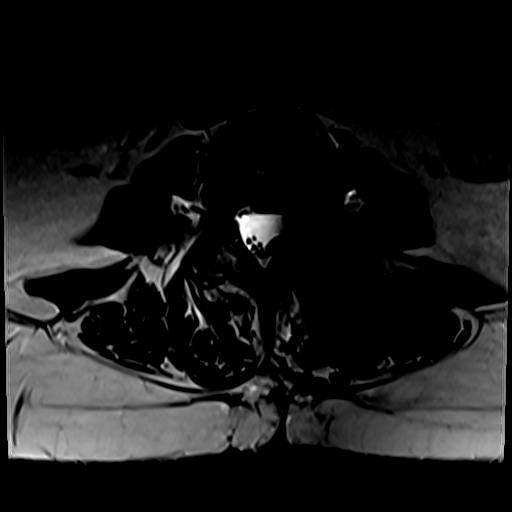
[im 20/44]
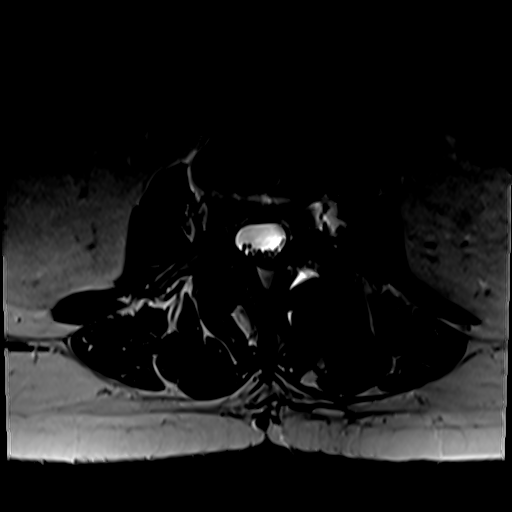
[im 24/44]
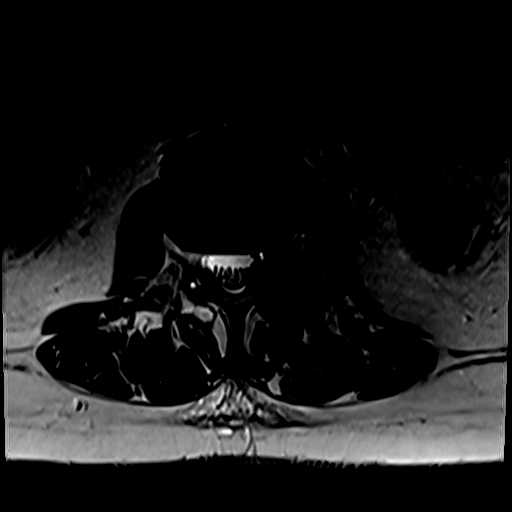
[im 29/44]
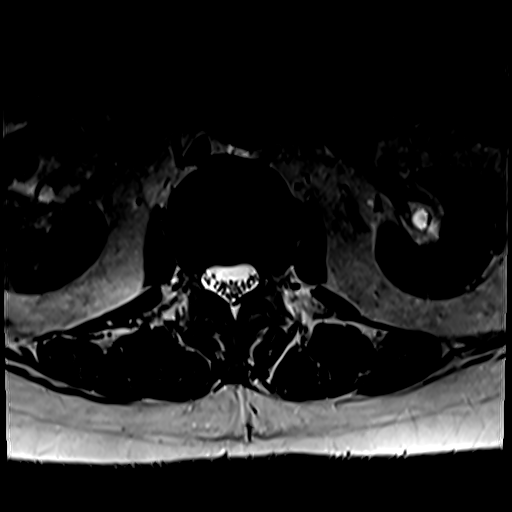
[im 39/44]
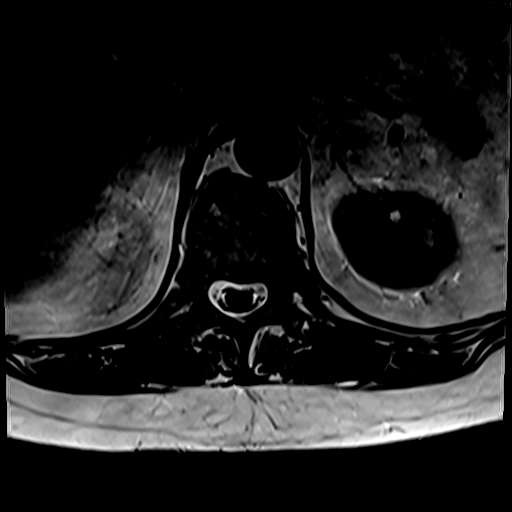
[im 44/44]
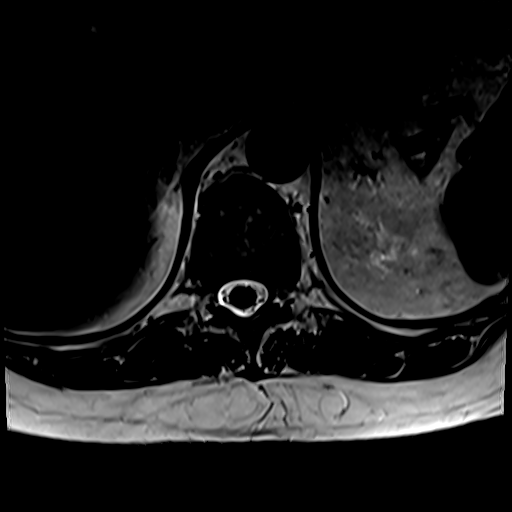

[Series 13: T2 · sagittal · 4.0mm · 0.73mm/px · 3 of 15 slices shown (2 of 2)]
[im 1/15]
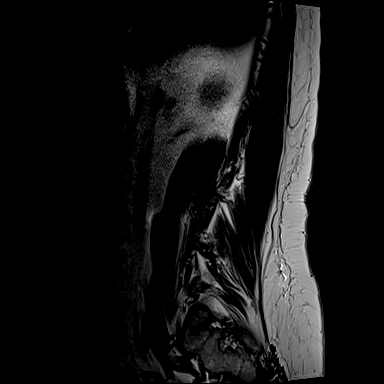
[im 8/15]
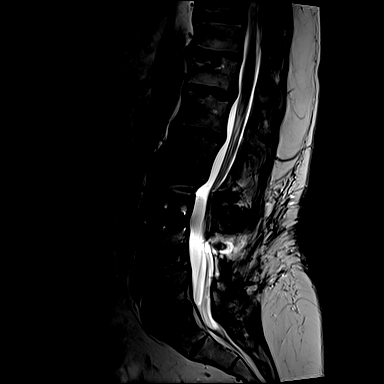
[im 15/15]
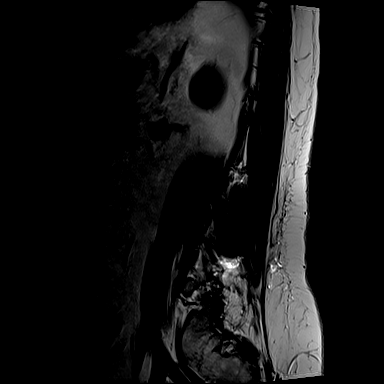

[21 of 48 positions shown; findings below may reference images not displayed]

FINDINGS: Segmentation:  5 lumbar type vertebrae

Alignment: Mild scoliosis. Grade 1 anterolisthesis at L4-5 and mild
retrolisthesis at L2-3.

Vertebrae: L3-4 PLIF. L2 limbus vertebra. No evidence of fracture or
bone lesion.

Conus medullaris and cauda equina: Conus extends to the T12-L1
level. Conus and cauda equina appear normal.

Paraspinal and other soft tissues: Scarring of intrinsic back
muscles.

Disc levels:

T12- L1: Shallow central disc protrusion. No change or neural
compression

L1-L2: Mild disc bulging with shallow central protrusion. No
impingement

L2-L3: Mild disc narrowing and bulging with interval retrolisthesis.
Posterior annular fissure. No neural impingement

L3-L4: PLIF.  No visible impingement or arachnoiditis

L4-L5: More remote PLIF. There is perineural fat effacement at the
inferior left foramen along the trajectory of the left-sided graft,
enhancing and attributed to fibrosis. On axial slices the nerve root
appears to pass superior to this fibrosis without distortion. The
canal is patent

L5-S1:Disc narrowing and bulging with degenerative facet spurring on
both sides. A small foraminal protrusion has occurred on the left. A
small synovial cyst projects posterior from the right facet. Mild
left foraminal narrowing that is stable conjoined root sleeves on
the left.
IMPRESSION: 1. L3-4 interval PLIF without adverse finding.
2. Remote L4-5 PLIF with notable fibrosis and architectural
distortion at the inferior left foramen.
3. Mild retrolisthesis is seen at L2-3, without notable progression
of degeneration and no impingement.
4. L5-S1 small left foraminal protrusion with mild foraminal
narrowing since prior. Conjoined left L5 and S1 root sleeves may
exacerbate this mild narrowing.
5. History of interval fall.  No fracture deformity.

## 2022-02-10 ENCOUNTER — Other Ambulatory Visit: Payer: Self-pay | Admitting: Neurosurgery

## 2022-02-10 DIAGNOSIS — M5412 Radiculopathy, cervical region: Secondary | ICD-10-CM

## 2022-02-20 ENCOUNTER — Ambulatory Visit
Admission: RE | Admit: 2022-02-20 | Discharge: 2022-02-20 | Disposition: A | Discharge status: Home / Self Care | Payer: BC Managed Care – PPO | Source: Ambulatory Visit | Attending: Neurosurgery | Admitting: Neurosurgery

## 2022-02-20 DIAGNOSIS — M5412 Radiculopathy, cervical region: Secondary | ICD-10-CM

## 2022-07-21 ENCOUNTER — Other Ambulatory Visit: Payer: Self-pay | Admitting: Orthopaedic Surgery

## 2022-08-03 NOTE — Patient Instructions (Addendum)
SURGICAL WAITING ROOM VISITATION Patients having surgery or a procedure may have no more than 2 support people in the waiting area - these visitors may rotate in the visitor waiting room.   Children under the age of 27 must have an adult with them who is not the patient. If the patient needs to stay at the hospital during part of their recovery, the visitor guidelines for inpatient rooms apply.  PRE-OP VISITATION  Pre-op nurse will coordinate an appropriate time for 1 support person to accompany the patient in pre-op.  This support person may not rotate.  This visitor will be contacted when the time is appropriate for the visitor to come back in the pre-op area.  Please refer to the [facility] website for the visitor guidelines for Inpatients (after your surgery is over and you are in a regular room).  You are not required to quarantine at this time prior to your surgery. However, you must do this: Hand Hygiene often Do NOT share personal items Notify your provider if you are in close contact with someone who has COVID or you develop fever 100.4 or greater, new onset of sneezing, cough, sore throat, shortness of breath or body aches.  If you test positive for Covid or have been in contact with anyone that has tested positive in the last 10 days please notify you surgeon.    Your procedure is scheduled on:  Tuesday  August 12, 2022  Report to [facility] Main Entrance: [place] entrance where the [employer] is available.   Report to admitting at:09:45  AM  +++++Call this number if you have any questions or problems the morning of surgery [phone]  Do not eat food after Midnight the night prior to your surgery/procedure.  After Midnight you may have the following liquids until  09:15 am DAY OF SURGERY  Clear Liquid Diet Water Black Coffee (sugar ok, NO MILK/CREAM OR CREAMERS)  Tea (sugar ok, NO MILK/CREAM OR CREAMERS) regular and decaf                              Plain Jell-O  with no fruit (NO RED)                                           Fruit ices (not with fruit pulp, NO RED)                                     Popsicles (NO RED)                                                                  Juice: apple, WHITE grape, WHITE cranberry Sports drinks like Gatorade or Powerade (NO RED)                     The day of surgery:  Drink ONE (1) Pre-Surgery Clear Ensure at 09:15  AM the morning of surgery. Drink in one sitting. Do not sip.  This drink was given to you  during your hospital pre-op appointment visit. Nothing else to drink after completing the Pre-Surgery Clear Ensure : No candy, chewing gum or throat lozenges.    FOLLOW  ANY ADDITIONAL PRE OP INSTRUCTIONS YOU RECEIVED FROM YOUR SURGEON'S OFFICE!!!   Oral Hygiene is also important to reduce your risk of infection.        Remember - BRUSH YOUR TEETH THE MORNING OF SURGERY WITH YOUR REGULAR TOOTHPASTE   Take ONLY these medicines the morning of surgery with A SIP OF WATER: Aripiprazole (Abilify), and use your Restasis eye drops. You may take Hydrocodone if needed for pain and use your albuterol Inhaler.    If You have been diagnosed with Sleep Apnea - Bring CPAP mask and tubing day of surgery. We will provide you with a CPAP machine on the day of your surgery.                   You may not have any metal on your body including hair pins, jewelry, and body piercing  Do not wear make-up, lotions, powders, perfumes or deodorant  Do not wear nail polish including gel and S&S, artificial / acrylic nails, or any other type of covering on natural nails including finger and toenails. If you have artificial nails, gel coating, etc., that needs to be removed by a nail salon, Please have this removed prior to surgery. Not doing so may mean that your surgery could be cancelled or delayed if the Surgeon or anesthesia staff feels like they are unable to monitor you safely.   Do not shave 48 hours  prior to surgery to avoid nicks in your skin which may contribute to postoperative infections.   Contacts, Hearing Aids, dentures or bridgework may not be worn into surgery.   You may bring a small overnight bag with you on the day of surgery, only pack items that are not valuable .[facility] IS NOT RESPONSIBLE   FOR VALUABLES THAT ARE LOST OR STOLEN.   Do not bring your home medications to the hospital. The Pharmacy will dispense medications listed on your medication list to you during your admission in the Hospital.  Special Instructions: Bring a copy of your healthcare power of attorney and living will documents the day of surgery, if you wish to have them scanned into your [facility]- EPIC  Please read over the following fact sheets you were given: IF YOU HAVE QUESTIONS ABOUT YOUR PRE-OP INSTRUCTIONS, PLEASE CALL [phone]  ([place])   [facility] - Preparing for Surgery Before surgery, you can play an important role.  Because skin is not sterile, your skin needs to be as free of germs as possible.  You can reduce the number of germs on your skin by washing with CHG (chlorahexidine gluconate) soap before surgery.  CHG is an antiseptic cleaner which kills germs and bonds with the skin to continue killing germs even after washing. Please DO NOT use if you have an allergy to CHG or antibacterial soaps.  If your skin becomes reddened/irritated stop using the CHG and inform your nurse when you arrive at Short Stay. Do not shave (including legs and underarms) for at least 48 hours prior to the first CHG shower.  You may shave your face/neck.  Please follow these instructions carefully:  1.  Shower with CHG Soap the night before surgery and the  morning of surgery.  2.  If you choose to wash your hair, wash your hair first as usual with your normal  shampoo.  3.  After you shampoo, rinse your hair and body thoroughly to remove the shampoo.                             4.  Use CHG as  you would any other liquid soap.  You can apply chg directly to the skin and wash.  Gently with a scrungie or clean washcloth.  5.  Apply the CHG Soap to your body ONLY FROM THE NECK DOWN.   Do not use on face/ open                           Wound or open sores. Avoid contact with eyes, ears mouth and genitals (private parts).                       Wash face,  Genitals (private parts) with your normal soap.             6.  Wash thoroughly, paying special attention to the area where your  surgery  will be performed.  7.  Thoroughly rinse your body with warm water from the neck down.  8.  DO NOT shower/wash with your normal soap after using and rinsing off the CHG Soap.            9.  Pat yourself dry with a clean towel.            10.  Wear clean pajamas.            11.  Place clean sheets on your bed the night of your first shower and do not  sleep with pets.  ON THE DAY OF SURGERY : Do not apply any lotions/deodorants the morning of surgery.  Please wear clean clothes to the hospital/surgery center.    FAILURE TO FOLLOW THESE INSTRUCTIONS MAY RESULT IN THE CANCELLATION OF YOUR SURGERY  PATIENT SIGNATURE_________________________________  NURSE SIGNATURE__________________________________  ________________________________________________________________________       Melissa Allen    An incentive spirometer is a tool that can help keep your lungs clear and active. This tool measures how well you are filling your lungs with each breath. Taking long deep breaths may help reverse or decrease the chance of developing breathing (pulmonary) problems (especially infection) following: A long period of time when you are unable to move or be active. BEFORE THE PROCEDURE  If the spirometer includes an indicator to show your best effort, your nurse or respiratory therapist will set it to a desired goal. If possible, sit up straight or lean slightly forward. Try not to slouch. Hold the  incentive spirometer in an upright position. INSTRUCTIONS FOR USE  Sit on the edge of your bed if possible, or sit up as far as you can in bed or on a chair. Hold the incentive spirometer in an upright position. Breathe out normally. Place the mouthpiece in your mouth and seal your lips tightly around it. Breathe in slowly and as deeply as possible, raising the piston or the ball toward the top of the column. Hold your breath for 3-5 seconds or for as long as possible. Allow the piston or ball to fall to the bottom of the column. Remove the mouthpiece from your mouth and breathe out normally. Rest for a few seconds and repeat Steps 1 through 7 at least 10 times every 1-2 hours when you are awake. Take  your time and take a few normal breaths between deep breaths. The spirometer may include an indicator to show your best effort. Use the indicator as a goal to work toward during each repetition. After each set of 10 deep breaths, practice coughing to be sure your lungs are clear. If you have an incision (the cut made at the time of surgery), support your incision when coughing by placing a pillow or rolled up towels firmly against it. Once you are able to get out of bed, walk around indoors and cough well. You may stop using the incentive spirometer when instructed by your caregiver.  RISKS AND COMPLICATIONS Take your time so you do not get dizzy or light-headed. If you are in pain, you may need to take or ask for pain medication before doing incentive spirometry. It is harder to take a deep breath if you are having pain. AFTER USE Rest and breathe slowly and easily. It can be helpful to keep track of a log of your progress. Your caregiver can provide you with a simple table to help with this. If you are using the spirometer at home, follow these instructions: [place] IF:  You are having difficultly using the spirometer. You have trouble using the spirometer as often as instructed. Your  pain medication is not giving enough relief while using the spirometer. You develop fever of 100.5 F (38.1 C) or higher.                                                                                                    SEEK IMMEDIATE MEDICAL CARE IF:  You cough up bloody sputum that had not been present before. You develop fever of 102 F (38.9 C) or greater. You develop worsening pain at or near the incision site. MAKE SURE YOU:  Understand these instructions. Will watch your condition. Will get help right away if you are not doing well or get worse. Document Released: 01/05/2007 Document Revised: 11/17/2011 Document Reviewed: 03/08/2007 [facility] Patient Information 2014 [place], [place].

## 2022-08-03 NOTE — Progress Notes (Addendum)
COVID Vaccine received:  _0  No _1  Yes Date of any COVID positive Test in last 90 days: None  PCP - Horald Pollen, MD Cardiologist - Esmond Plants, MD (LOV 2011) Pain Medicine-  Dr. Nicholaus Bloom  Chest x-ray - done at [facility] ordered by MD EKG - 08-29-21   (repeat ordered by MD  which can be done at [facility] per Tamika) Stress Test -  ECHO - 10-19-2018 Cardiac Cath -   PCR screen: _2  Ordered & Completed                      _3   No Order but Needs PROFEND                      _4   N/A for this surgery  Surgery Plan:  _5  Ambulatory                            _6  Outpatient in bed                            _7  Admit  Anesthesia:    _8  General  _9  Spinal                           _10   Choice _11   MAC  Pacemaker / ICD device _12  No _13  Yes        Device order form faxed _14  No    _15   Yes      Faxed to:  History of Sleep Apnea? _16  No _17  Yes   CPAP used?- _18  No _19  Yes    Does the patient monitor blood sugar? _20  No _21  Yes  _22  N/A  Blood Thinner / Instructions:   none Aspirin Instructions:  ERAS Protocol Ordered: _23  No  _24  Yes PRE-SURGERY _25  ENSURE  _26  G2   Patient is to be NPO after: 09:15 am  Comments: S/P double mastectomy w/ implants  No Restricted arms per patient   Activity level: Patient can climb a flight of stairs without difficulty; _27  No CP  _28  No SOB, but would have knee pain.Patient can perform ADLs without assistance.   Anesthesia review: HTN, PONV, asthma, CHF, Palps, Remote hx CP, Chronic pancreatitis and liver cysts.   Patient denies shortness of breath, fever, cough and chest pain at PAT appointment.  Patient verbalized understanding and agreement to the Pre-Surgical Instructions that were given to them at this PAT appointment. Patient was also educated of the need to review these PAT instructions again prior to his/her surgery.I reviewed the appropriate phone numbers to call if they have any and questions or concerns.

## 2022-08-05 ENCOUNTER — Encounter (HOSPITAL_COMMUNITY): Payer: Self-pay

## 2022-08-05 ENCOUNTER — Other Ambulatory Visit: Payer: Self-pay

## 2022-08-05 ENCOUNTER — Ambulatory Visit (HOSPITAL_COMMUNITY)
Admission: RE | Admit: 2022-08-05 | Discharge: 2022-08-05 | Disposition: A | Discharge status: Home / Self Care | Payer: BC Managed Care – PPO | Source: Ambulatory Visit | Attending: Orthopaedic Surgery | Admitting: Orthopaedic Surgery

## 2022-08-05 ENCOUNTER — Encounter (HOSPITAL_COMMUNITY)
Admission: RE | Admit: 2022-08-05 | Discharge: 2022-08-05 | Disposition: A | Discharge status: Home / Self Care | Payer: BC Managed Care – PPO | Source: Ambulatory Visit | Attending: Orthopaedic Surgery | Admitting: Orthopaedic Surgery

## 2022-08-05 VITALS — BP 148/90 | HR 102 | Temp 99.3°F | Resp 22 | Ht 64.0 in | Wt 183.0 lb

## 2022-08-05 DIAGNOSIS — I10 Essential (primary) hypertension: Secondary | ICD-10-CM | POA: Diagnosis not present

## 2022-08-05 DIAGNOSIS — Z01818 Encounter for other preprocedural examination: Secondary | ICD-10-CM | POA: Insufficient documentation

## 2022-08-05 HISTORY — DX: Pneumonia, unspecified organism: J18.9

## 2022-08-05 LAB — CBC
HCT: 42.8 % (ref 36.0–46.0)
Hemoglobin: 13.6 g/dL (ref 12.0–15.0)
MCH: 29.1 pg (ref 26.0–34.0)
MCHC: 31.8 g/dL (ref 30.0–36.0)
MCV: 91.6 fL (ref 80.0–100.0)
Platelets: 275 10*3/uL (ref 150–400)
RBC: 4.67 MIL/uL (ref 3.87–5.11)
RDW: 13 % (ref 11.5–15.5)
WBC: 7.1 10*3/uL (ref 4.0–10.5)
nRBC: 0 % (ref 0.0–0.2)

## 2022-08-05 LAB — COMPREHENSIVE METABOLIC PANEL
ALT: 17 U/L (ref 0–44)
AST: 21 U/L (ref 15–41)
Albumin: 4 g/dL (ref 3.5–5.0)
Alkaline Phosphatase: 57 U/L (ref 38–126)
Anion gap: 9 (ref 5–15)
BUN: 12 mg/dL (ref 6–20)
CO2: 24 mmol/L (ref 22–32)
Calcium: 9.3 mg/dL (ref 8.9–10.3)
Chloride: 107 mmol/L (ref 98–111)
Creatinine, Ser: 0.74 mg/dL (ref 0.44–1.00)
GFR, Estimated: >60 mL/min (ref >60)
Glucose, Bld: 92 mg/dL (ref 70–99)
Potassium: 3.8 mmol/L (ref 3.5–5.1)
Sodium: 140 mmol/L (ref 135–145)
Total Bilirubin: 0.5 mg/dL (ref 0.3–1.2)
Total Protein: 7 g/dL (ref 6.5–8.1)

## 2022-08-05 LAB — SURGICAL PCR SCREEN
MRSA, PCR: NEGATIVE
Staphylococcus aureus: NEGATIVE
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
## 2022-08-12 ENCOUNTER — Encounter (HOSPITAL_COMMUNITY): Payer: Self-pay | Admitting: Orthopaedic Surgery

## 2022-08-12 ENCOUNTER — Other Ambulatory Visit: Payer: Self-pay

## 2022-08-12 ENCOUNTER — Encounter (HOSPITAL_COMMUNITY)
Admission: RE | Disposition: A | Discharge status: Home Health | Payer: Self-pay | Source: Ambulatory Visit | Attending: Orthopaedic Surgery

## 2022-08-12 ENCOUNTER — Observation Stay (HOSPITAL_COMMUNITY)
Admission: RE | Admit: 2022-08-12 | Discharge: 2022-08-13 | Disposition: A | Discharge status: Home Health | Payer: BC Managed Care – PPO | Source: Ambulatory Visit | Attending: Orthopaedic Surgery | Admitting: Orthopaedic Surgery

## 2022-08-12 ENCOUNTER — Ambulatory Visit (HOSPITAL_COMMUNITY): Payer: BC Managed Care – PPO | Admitting: Physician Assistant

## 2022-08-12 ENCOUNTER — Ambulatory Visit (HOSPITAL_COMMUNITY): Payer: BC Managed Care – PPO | Admitting: Certified Registered Nurse Anesthetist

## 2022-08-12 DIAGNOSIS — Z853 Personal history of malignant neoplasm of breast: Secondary | ICD-10-CM | POA: Diagnosis not present

## 2022-08-12 DIAGNOSIS — M1712 Unilateral primary osteoarthritis, left knee: Secondary | ICD-10-CM | POA: Diagnosis present

## 2022-08-12 DIAGNOSIS — Z79899 Other long term (current) drug therapy: Secondary | ICD-10-CM | POA: Insufficient documentation

## 2022-08-12 DIAGNOSIS — Z91040 Latex allergy status: Secondary | ICD-10-CM | POA: Diagnosis not present

## 2022-08-12 DIAGNOSIS — J45909 Unspecified asthma, uncomplicated: Secondary | ICD-10-CM | POA: Diagnosis not present

## 2022-08-12 DIAGNOSIS — Z96611 Presence of right artificial shoulder joint: Secondary | ICD-10-CM | POA: Insufficient documentation

## 2022-08-12 DIAGNOSIS — Z01818 Encounter for other preprocedural examination: Secondary | ICD-10-CM

## 2022-08-12 DIAGNOSIS — I10 Essential (primary) hypertension: Secondary | ICD-10-CM | POA: Diagnosis not present

## 2022-08-12 HISTORY — PX: TOTAL KNEE ARTHROPLASTY: SHX125

## 2022-08-12 SURGERY — ARTHROPLASTY, KNEE, TOTAL
Anesthesia: Spinal | Site: Knee | Laterality: Left

## 2022-08-12 MED ORDER — TRANEXAMIC ACID-NACL 1000-0.7 MG/100ML-% IV SOLN
1000.0000 mg | Freq: Once | INTRAVENOUS | Status: AC
  Administered 2022-08-12: 1000 mg via INTRAVENOUS
  Filled 2022-08-12: qty 100

## 2022-08-12 MED ORDER — SODIUM CHLORIDE (PF) 0.9 % IJ SOLN
INTRAMUSCULAR | Status: AC
  Filled 2022-08-12: qty 30

## 2022-08-12 MED ORDER — BUPIVACAINE LIPOSOME 1.3 % IJ SUSP
INTRAMUSCULAR | Status: DC | PRN
  Administered 2022-08-12: 20 mL

## 2022-08-12 MED ORDER — CEFAZOLIN SODIUM-DEXTROSE 2-4 GM/100ML-% IV SOLN
2.0000 g | Freq: Four times a day (QID) | INTRAVENOUS | Status: AC
  Administered 2022-08-12 – 2022-08-13 (×2): 2 g via INTRAVENOUS
  Filled 2022-08-12 (×2): qty 100

## 2022-08-12 MED ORDER — ROPIVACAINE HCL 7.5 MG/ML IJ SOLN
INTRAMUSCULAR | Status: DC | PRN
  Administered 2022-08-12: 20 mL via PERINEURAL

## 2022-08-12 MED ORDER — STERILE WATER FOR IRRIGATION IR SOLN
Status: DC | PRN
  Administered 2022-08-12: 2000 mL

## 2022-08-12 MED ORDER — OXYCODONE HCL 5 MG PO TABS
10.0000 mg | ORAL_TABLET | ORAL | Status: DC | PRN
  Administered 2022-08-12 – 2022-08-13 (×4): 15 mg via ORAL
  Filled 2022-08-12 (×4): qty 3

## 2022-08-12 MED ORDER — BUPIVACAINE LIPOSOME 1.3 % IJ SUSP
INTRAMUSCULAR | Status: AC
  Filled 2022-08-12: qty 20

## 2022-08-12 MED ORDER — LACTATED RINGERS IV SOLN: INTRAVENOUS | Status: DC

## 2022-08-12 MED ORDER — ASPIRIN 81 MG PO CHEW
81.0000 mg | CHEWABLE_TABLET | Freq: Two times a day (BID) | ORAL | Status: DC
  Administered 2022-08-13: 81 mg via ORAL
  Filled 2022-08-12: qty 1

## 2022-08-12 MED ORDER — SODIUM CHLORIDE (PF) 0.9 % IJ SOLN
INTRAMUSCULAR | Status: DC | PRN
  Administered 2022-08-12: 30 mL

## 2022-08-12 MED ORDER — PHENOL 1.4 % MT LIQD: 1.0000 | OROMUCOSAL | Status: DC | PRN

## 2022-08-12 MED ORDER — TRANEXAMIC ACID-NACL 1000-0.7 MG/100ML-% IV SOLN
1000.0000 mg | INTRAVENOUS | Status: AC
  Administered 2022-08-12: 1000 mg via INTRAVENOUS
  Filled 2022-08-12: qty 100

## 2022-08-12 MED ORDER — MIDAZOLAM HCL 2 MG/2ML IJ SOLN: 1.0000 mg | INTRAMUSCULAR | Status: DC

## 2022-08-12 MED ORDER — ONDANSETRON HCL 4 MG PO TABS: 4.0000 mg | ORAL_TABLET | Freq: Four times a day (QID) | ORAL | Status: DC | PRN

## 2022-08-12 MED ORDER — MEPERIDINE HCL 50 MG/ML IJ SOLN: 6.2500 mg | INTRAMUSCULAR | Status: DC | PRN

## 2022-08-12 MED ORDER — PROPOFOL 1000 MG/100ML IV EMUL
INTRAVENOUS | Status: AC
  Filled 2022-08-12: qty 100

## 2022-08-12 MED ORDER — POVIDONE-IODINE 10 % EX SWAB
2.0000 | Freq: Once | CUTANEOUS | Status: AC
  Administered 2022-08-12: 2 via TOPICAL

## 2022-08-12 MED ORDER — FENTANYL CITRATE PF 50 MCG/ML IJ SOSY: 50.0000 ug | PREFILLED_SYRINGE | INTRAMUSCULAR | Status: DC

## 2022-08-12 MED ORDER — OXYCODONE HCL 5 MG PO TABS: 5.0000 mg | ORAL_TABLET | ORAL | Status: DC | PRN

## 2022-08-12 MED ORDER — ONDANSETRON HCL 4 MG/2ML IJ SOLN: 4.0000 mg | Freq: Four times a day (QID) | INTRAMUSCULAR | Status: DC | PRN

## 2022-08-12 MED ORDER — PROPOFOL 10 MG/ML IV BOLUS
INTRAVENOUS | Status: DC | PRN
  Administered 2022-08-12: 10 mg via INTRAVENOUS
  Administered 2022-08-12: 20 mg via INTRAVENOUS
  Administered 2022-08-12 (×2): 10 mg via INTRAVENOUS

## 2022-08-12 MED ORDER — LEVOCETIRIZINE DIHYDROCHLORIDE 5 MG PO TABS: 5.0000 mg | ORAL_TABLET | Freq: Every evening | ORAL | Status: DC

## 2022-08-12 MED ORDER — PHENYLEPHRINE HCL-NACL 20-0.9 MG/250ML-% IV SOLN
INTRAVENOUS | Status: DC | PRN
  Administered 2022-08-12: 25 ug/min via INTRAVENOUS

## 2022-08-12 MED ORDER — PROPOFOL 500 MG/50ML IV EMUL
INTRAVENOUS | Status: DC | PRN
  Administered 2022-08-12: 100 ug/kg/min via INTRAVENOUS

## 2022-08-12 MED ORDER — ACETAMINOPHEN 325 MG PO TABS: 325.0000 mg | ORAL_TABLET | Freq: Four times a day (QID) | ORAL | Status: DC | PRN

## 2022-08-12 MED ORDER — OXYCODONE HCL 5 MG PO TABS
5.0000 mg | ORAL_TABLET | Freq: Once | ORAL | Status: AC
  Administered 2022-08-12: 5 mg via ORAL

## 2022-08-12 MED ORDER — ACETAMINOPHEN 500 MG PO TABS
1000.0000 mg | ORAL_TABLET | Freq: Four times a day (QID) | ORAL | Status: DC
  Administered 2022-08-12 – 2022-08-13 (×3): 1000 mg via ORAL
  Filled 2022-08-12 (×4): qty 2

## 2022-08-12 MED ORDER — PROPOFOL 500 MG/50ML IV EMUL
INTRAVENOUS | Status: AC
  Filled 2022-08-12: qty 50

## 2022-08-12 MED ORDER — OXYCODONE HCL 5 MG PO TABS
ORAL_TABLET | ORAL | Status: AC
  Filled 2022-08-12: qty 1

## 2022-08-12 MED ORDER — DOCUSATE SODIUM 100 MG PO CAPS
100.0000 mg | ORAL_CAPSULE | Freq: Two times a day (BID) | ORAL | Status: DC
  Administered 2022-08-12 – 2022-08-13 (×2): 100 mg via ORAL
  Filled 2022-08-12 (×2): qty 1

## 2022-08-12 MED ORDER — KETOROLAC TROMETHAMINE 15 MG/ML IJ SOLN
15.0000 mg | Freq: Four times a day (QID) | INTRAMUSCULAR | Status: AC
  Administered 2022-08-12 – 2022-08-13 (×4): 15 mg via INTRAVENOUS
  Filled 2022-08-12 (×4): qty 1

## 2022-08-12 MED ORDER — METHOCARBAMOL 1000 MG/10ML IJ SOLN: 500.0000 mg | Freq: Four times a day (QID) | INTRAVENOUS | Status: DC | PRN

## 2022-08-12 MED ORDER — ONDANSETRON HCL 4 MG/2ML IJ SOLN
INTRAMUSCULAR | Status: DC | PRN
  Administered 2022-08-12: 4 mg via INTRAVENOUS

## 2022-08-12 MED ORDER — CHLORHEXIDINE GLUCONATE 0.12 % MT SOLN
15.0000 mL | Freq: Once | OROMUCOSAL | Status: AC
  Administered 2022-08-12: 15 mL via OROMUCOSAL

## 2022-08-12 MED ORDER — FENTANYL CITRATE PF 50 MCG/ML IJ SOSY
PREFILLED_SYRINGE | INTRAMUSCULAR | Status: AC
  Administered 2022-08-12: 100 ug via INTRAVENOUS
  Filled 2022-08-12: qty 2

## 2022-08-12 MED ORDER — ACETAMINOPHEN 10 MG/ML IV SOLN
1000.0000 mg | Freq: Once | INTRAVENOUS | Status: AC
  Administered 2022-08-12: 1000 mg via INTRAVENOUS

## 2022-08-12 MED ORDER — DULOXETINE HCL 60 MG PO CPEP
90.0000 mg | ORAL_CAPSULE | Freq: Every day | ORAL | Status: DC
  Administered 2022-08-12: 90 mg via ORAL
  Filled 2022-08-12: qty 1

## 2022-08-12 MED ORDER — HYDROMORPHONE HCL 1 MG/ML IJ SOLN
0.5000 mg | INTRAMUSCULAR | Status: DC | PRN
  Administered 2022-08-12 (×2): 1 mg via INTRAVENOUS
  Filled 2022-08-12 (×2): qty 1

## 2022-08-12 MED ORDER — ALBUTEROL SULFATE HFA 108 (90 BASE) MCG/ACT IN AERS: 2.0000 | INHALATION_SPRAY | RESPIRATORY_TRACT | Status: DC | PRN

## 2022-08-12 MED ORDER — ORAL CARE MOUTH RINSE: 15.0000 mL | OROMUCOSAL | Status: DC | PRN

## 2022-08-12 MED ORDER — QUETIAPINE FUMARATE 50 MG PO TABS
400.0000 mg | ORAL_TABLET | Freq: Every day | ORAL | Status: DC
  Administered 2022-08-12: 400 mg via ORAL
  Filled 2022-08-12: qty 8

## 2022-08-12 MED ORDER — ALBUTEROL SULFATE (2.5 MG/3ML) 0.083% IN NEBU: 2.5000 mg | INHALATION_SOLUTION | RESPIRATORY_TRACT | Status: DC | PRN

## 2022-08-12 MED ORDER — LORATADINE 10 MG PO TABS
10.0000 mg | ORAL_TABLET | Freq: Every evening | ORAL | Status: DC
  Administered 2022-08-12: 10 mg via ORAL
  Filled 2022-08-12: qty 1

## 2022-08-12 MED ORDER — 0.9 % SODIUM CHLORIDE (POUR BTL) OPTIME
TOPICAL | Status: DC | PRN
  Administered 2022-08-12: 1000 mL

## 2022-08-12 MED ORDER — ACETAMINOPHEN 10 MG/ML IV SOLN
INTRAVENOUS | Status: AC
  Filled 2022-08-12: qty 100

## 2022-08-12 MED ORDER — METOCLOPRAMIDE HCL 5 MG/ML IJ SOLN: 5.0000 mg | Freq: Three times a day (TID) | INTRAMUSCULAR | Status: DC | PRN

## 2022-08-12 MED ORDER — TRIAMTERENE-HCTZ 37.5-25 MG PO TABS
1.0000 | ORAL_TABLET | Freq: Every day | ORAL | Status: DC
  Administered 2022-08-13: 1 via ORAL
  Filled 2022-08-12: qty 1

## 2022-08-12 MED ORDER — ZOLPIDEM TARTRATE 5 MG PO TABS: 5.0000 mg | ORAL_TABLET | Freq: Every evening | ORAL | Status: DC | PRN

## 2022-08-12 MED ORDER — CYCLOSPORINE 0.05 % OP EMUL
1.0000 [drp] | Freq: Two times a day (BID) | OPHTHALMIC | Status: DC
  Administered 2022-08-12 – 2022-08-13 (×2): 1 [drp] via OPHTHALMIC
  Filled 2022-08-12 (×2): qty 30

## 2022-08-12 MED ORDER — MENTHOL 3 MG MT LOZG: 1.0000 | LOZENGE | OROMUCOSAL | Status: DC | PRN

## 2022-08-12 MED ORDER — METOCLOPRAMIDE HCL 5 MG PO TABS: 5.0000 mg | ORAL_TABLET | Freq: Three times a day (TID) | ORAL | Status: DC | PRN

## 2022-08-12 MED ORDER — CEFAZOLIN SODIUM-DEXTROSE 2-4 GM/100ML-% IV SOLN
2.0000 g | INTRAVENOUS | Status: AC
  Administered 2022-08-12: 2 g via INTRAVENOUS
  Filled 2022-08-12: qty 100

## 2022-08-12 MED ORDER — ORAL CARE MOUTH RINSE: 15.0000 mL | Freq: Once | OROMUCOSAL | Status: AC

## 2022-08-12 MED ORDER — BISACODYL 5 MG PO TBEC: 5.0000 mg | DELAYED_RELEASE_TABLET | Freq: Every day | ORAL | Status: DC | PRN

## 2022-08-12 MED ORDER — HYDROMORPHONE HCL 1 MG/ML IJ SOLN: 0.2500 mg | INTRAMUSCULAR | Status: DC | PRN

## 2022-08-12 MED ORDER — MIDAZOLAM HCL 2 MG/2ML IJ SOLN
INTRAMUSCULAR | Status: AC
  Administered 2022-08-12: 2 mg via INTRAVENOUS
  Filled 2022-08-12: qty 2

## 2022-08-12 MED ORDER — LOSARTAN POTASSIUM 50 MG PO TABS
50.0000 mg | ORAL_TABLET | Freq: Two times a day (BID) | ORAL | Status: DC
  Administered 2022-08-12 – 2022-08-13 (×2): 50 mg via ORAL
  Filled 2022-08-12 (×2): qty 1

## 2022-08-12 MED ORDER — BUPIVACAINE IN DEXTROSE 0.75-8.25 % IT SOLN
INTRATHECAL | Status: DC | PRN
  Administered 2022-08-12: 2 mL via INTRATHECAL

## 2022-08-12 MED ORDER — ALUM & MAG HYDROXIDE-SIMETH 200-200-20 MG/5ML PO SUSP: 30.0000 mL | ORAL | Status: DC | PRN

## 2022-08-12 MED ORDER — BUPIVACAINE LIPOSOME 1.3 % IJ SUSP: 20.0000 mL | Freq: Once | INTRAMUSCULAR | Status: AC

## 2022-08-12 MED ORDER — BUPIVACAINE-EPINEPHRINE 0.5% -1:200000 IJ SOLN
INTRAMUSCULAR | Status: DC | PRN
  Administered 2022-08-12: 30 mL

## 2022-08-12 MED ORDER — BUPIVACAINE-EPINEPHRINE (PF) 0.5% -1:200000 IJ SOLN
INTRAMUSCULAR | Status: AC
  Filled 2022-08-12: qty 30

## 2022-08-12 MED ORDER — METHOCARBAMOL 500 MG PO TABS
500.0000 mg | ORAL_TABLET | Freq: Four times a day (QID) | ORAL | Status: DC | PRN
  Administered 2022-08-12 – 2022-08-13 (×3): 500 mg via ORAL
  Filled 2022-08-12 (×3): qty 1

## 2022-08-12 MED ORDER — PROMETHAZINE HCL 25 MG/ML IJ SOLN: 6.2500 mg | INTRAMUSCULAR | Status: DC | PRN

## 2022-08-12 MED ORDER — TRANEXAMIC ACID 1000 MG/10ML IV SOLN
2000.0000 mg | INTRAVENOUS | Status: AC
  Filled 2022-08-12: qty 20

## 2022-08-12 MED ORDER — DIPHENHYDRAMINE HCL 12.5 MG/5ML PO ELIX: 12.5000 mg | ORAL_SOLUTION | ORAL | Status: DC | PRN

## 2022-08-12 MED ORDER — ONDANSETRON HCL 4 MG/2ML IJ SOLN
INTRAMUSCULAR | Status: AC
  Filled 2022-08-12: qty 2

## 2022-08-12 MED ORDER — ARIPIPRAZOLE 10 MG PO TABS
10.0000 mg | ORAL_TABLET | Freq: Every day | ORAL | Status: DC
  Administered 2022-08-13: 10 mg via ORAL
  Filled 2022-08-12: qty 1

## 2022-08-12 MED ORDER — DOXEPIN HCL 10 MG PO CAPS
20.0000 mg | ORAL_CAPSULE | Freq: Every day | ORAL | Status: DC
  Administered 2022-08-12: 20 mg via ORAL
  Filled 2022-08-12: qty 2

## 2022-08-12 MED ORDER — TRANEXAMIC ACID 1000 MG/10ML IV SOLN
INTRAVENOUS | Status: DC | PRN
  Administered 2022-08-12: 2000 mg via TOPICAL

## 2022-08-12 MED ORDER — ESTRADIOL 0.5 MG PO TABS
1.0000 mg | ORAL_TABLET | Freq: Every day | ORAL | Status: DC
  Administered 2022-08-13: 1 mg via ORAL
  Filled 2022-08-12: qty 2

## 2022-08-12 SURGICAL SUPPLY — 53 items
ATTUNE PSFEM LTSZ5 NARCEM KNEE (Femur) IMPLANT
ATTUNE PSRP INSE SZ5 7 KNEE (Insert) IMPLANT
BAG COUNTER SPONGE SURGICOUNT (BAG) ×1 IMPLANT
BAG DECANTER FOR FLEXI CONT (MISCELLANEOUS) ×1 IMPLANT
BAG ZIPLOCK 12X15 (MISCELLANEOUS) ×1 IMPLANT
BASEPLATE TIBIAL ROTATING SZ 4 (Knees) IMPLANT
BLADE SAGITTAL 25.0X1.19X90 (BLADE) ×1 IMPLANT
BLADE SAW SGTL 11.0X1.19X90.0M (BLADE) ×1 IMPLANT
BLADE SURG SZ10 CARB STEEL (BLADE) ×1 IMPLANT
BNDG ELASTIC 6X10 VLCR STRL LF (GAUZE/BANDAGES/DRESSINGS) IMPLANT
BNDG ELASTIC 6X5.8 VLCR STR LF (GAUZE/BANDAGES/DRESSINGS) ×1 IMPLANT
BOOTIES KNEE HIGH SLOAN (MISCELLANEOUS) ×1 IMPLANT
BOWL SMART MIX CTS (DISPOSABLE) ×1 IMPLANT
CEMENT HV SMART SET (Cement) ×2 IMPLANT
COVER SURGICAL LIGHT HANDLE (MISCELLANEOUS) ×1 IMPLANT
CUFF TOURN SGL QUICK 34 (TOURNIQUET CUFF) ×1
CUFF TRNQT CYL 34X4.125X (TOURNIQUET CUFF) ×1 IMPLANT
DRAPE INCISE IOBAN 66X45 STRL (DRAPES) ×1 IMPLANT
DRAPE SHEET LG 3/4 BI-LAMINATE (DRAPES) ×1 IMPLANT
DRAPE TOP 10253 STERILE (DRAPES) ×1 IMPLANT
DRAPE U-SHAPE 47X51 STRL (DRAPES) ×1 IMPLANT
DRSG AQUACEL AG ADV 3.5X10 (GAUZE/BANDAGES/DRESSINGS) ×1 IMPLANT
DURAPREP 26ML APPLICATOR (WOUND CARE) ×2 IMPLANT
ELECT REM PT RETURN 15FT ADLT (MISCELLANEOUS) ×1 IMPLANT
GLOVE BIO SURGEON STRL SZ8 (GLOVE) ×2 IMPLANT
GLOVE BIOGEL PI IND STRL 8 (GLOVE) ×2 IMPLANT
GOWN STRL REUS W/ TWL XL LVL3 (GOWN DISPOSABLE) ×2 IMPLANT
GOWN STRL REUS W/TWL XL LVL3 (GOWN DISPOSABLE) ×2
HANDPIECE INTERPULSE COAX TIP (DISPOSABLE) ×1
HOLDER FOLEY CATH W/STRAP (MISCELLANEOUS) IMPLANT
HOOD PEEL AWAY T7 (MISCELLANEOUS) ×3 IMPLANT
KIT TURNOVER KIT A (KITS) IMPLANT
MANIFOLD NEPTUNE II (INSTRUMENTS) ×1 IMPLANT
NEEDLE HYPO 22GX1.5 SAFETY (NEEDLE) ×1 IMPLANT
NS IRRIG 1000ML POUR BTL (IV SOLUTION) ×1 IMPLANT
PACK TOTAL KNEE CUSTOM (KITS) ×1 IMPLANT
PAD ARMBOARD 7.5X6 YLW CONV (MISCELLANEOUS) ×1 IMPLANT
PATELLA MEDIAL ATTUN 35MM KNEE (Knees) IMPLANT
PIN STEINMAN FIXATION KNEE (PIN) IMPLANT
PROTECTOR NERVE ULNAR (MISCELLANEOUS) ×1 IMPLANT
SET HNDPC FAN SPRY TIP SCT (DISPOSABLE) ×1 IMPLANT
SPIKE FLUID TRANSFER (MISCELLANEOUS) ×2 IMPLANT
SUT ETHIBOND NAB CT1 #1 30IN (SUTURE) ×2 IMPLANT
SUT VIC AB 0 CT1 36 (SUTURE) ×1 IMPLANT
SUT VIC AB 2-0 CT1 27 (SUTURE) ×1
SUT VIC AB 2-0 CT1 TAPERPNT 27 (SUTURE) ×1 IMPLANT
SUT VICRYL AB 3-0 FS1 BRD 27IN (SUTURE) ×1 IMPLANT
SUT VLOC 180 0 24IN GS25 (SUTURE) ×1 IMPLANT
TRAY FOL W/BAG SLVR 16FR STRL (SET/KITS/TRAYS/PACK) IMPLANT
TRAY FOLEY W/BAG SLVR 16FR LF (SET/KITS/TRAYS/PACK) ×1
WATER STERILE IRR 1000ML POUR (IV SOLUTION) ×1 IMPLANT
WRAP KNEE MAXI GEL POST OP (GAUZE/BANDAGES/DRESSINGS) ×1 IMPLANT
YANKAUER SUCT BULB TIP NO VENT (SUCTIONS) ×1 IMPLANT

## 2022-08-12 NOTE — Op Note (Signed)
PREOP DIAGNOSIS: DJD LEFT KNEE POSTOP DIAGNOSIS:  same PROCEDURE: LEFT TKR ANESTHESIA: Spinal and MAC ATTENDING SURGEON: Hessie Dibble ASSISTANT: Loni Dolly PA  INDICATIONS FOR PROCEDURE: Melissa Allen is a 56 y.o. female who has struggled for a long time with pain due to degenerative arthritis of the left knee.  The patient has failed many conservative non-operative measures and at this point has pain which limits the ability to sleep and walk.  The patient is offered total knee replacement.  Informed operative consent was obtained after discussion of possible risks of anesthesia, infection, neurovascular injury, DVT, and death.  The importance of the post-operative rehabilitation protocol to optimize result was stressed extensively with the patient.  SUMMARY OF FINDINGS AND PROCEDURE:  Melissa Allen was taken to the operative suite where under the above anesthesia a left knee replacement was performed.  There were advanced degenerative changes and the bone quality was fair.  We used the DePuyAttune system and placed size 5 narrow femur, 4 tibia, 35 mm all polyethylene patella, and a size 7 mm spacer.  Loni Dolly PA-C assisted throughout and was invaluable to the completion of the case in that he helped retract and maintain exposure while I placed the components.  He also helped close thereby minimizing OR time.  The patient was admitted for appropriate post-op care to include perioperative antibiotics and mechanical and pharmacologic measures for DVT prophylaxis.  DESCRIPTION OF PROCEDURE:  Melissa Allen was taken to the operative suite where the above anesthesia was applied.  The patient was positioned supine and prepped and draped in normal sterile fashion.  An appropriate time out was performed.  After the administration of kefzol pre-op antibiotic the leg was elevated and exsanguinated and a tourniquet inflated.  A standard longitudinal incision was made on the anterior knee.  Dissection was  carried down to the extensor mechanism.  All appropriate anti-infective measures were used including the pre-operative antibiotic, betadine impregnated drape, and closed hooded exhaust systems for each member of the surgical team.  A medial parapatellar incision was made in the extensor mechanism and the knee cap flipped and the knee flexed.  Some residual meniscal tissues were removed along with any remaining ACL/PCL tissue.  A guide was placed on the tibia and a flat cut was made on it's superior surface.  An intramedullary guide was placed in the femur and was utilized to make anterior and posterior cuts creating an appropriate flexion gap.  A second intramedullary guide was placed in the femur to make a distal cut properly balancing the knee with an extension gap equal to the flexion gap.  The three bones sized to the above mentioned sizes and the appropriate guides were placed and utilized.  A trial reduction was done and the knee easily came to full extension and the patella tracked well on flexion.  The trial components were removed and all bones were cleaned with pulsatile lavage and then dried thoroughly.  Cement was mixed and was pressurized onto the bones followed by placement of the aforementioned components.  Excess cement was trimmed and pressure was held on the components until the cement had hardened.  The tourniquet was deflated and a small amount of bleeding was controlled with cautery and pressure.  The knee was irrigated thoroughly.  The extensor mechanism was re-approximated with #1 ethibond in interrupted fashion.  The knee was flexed and the repair was solid.  The subcutaneous tissues were re-approximated with #0 and #2-0 vicryl and the  skin closed with a subcuticular stitch and steristrips.  A sterile dressing was applied.  Intraoperative fluids, EBL, and tourniquet time can be obtained from anesthesia records.  DISPOSITION:  The patient was taken to recovery room in stable condition and  scheduled to potentially go home same day depending on ability to walk and tolerate liquids.  Monico Blitz Orelia Brandstetter 08/12/2022, 1:28 PM

## 2022-08-12 NOTE — Anesthesia Preprocedure Evaluation (Signed)
Anesthesia Evaluation  Patient identified by MRN, date of birth, ID band Patient awake    Reviewed: Allergy & Precautions, H&P , NPO status , Patient's Chart, lab work & pertinent test results  History of Anesthesia Complications (+) PONV and history of anesthetic complications  Airway Mallampati: II  TM Distance: >3 FB Neck ROM: Full    Dental no notable dental hx.    Pulmonary shortness of breath, COPD   Pulmonary exam normal breath sounds clear to auscultation       Cardiovascular hypertension, Normal cardiovascular exam Rhythm:Regular Rate:Normal     Neuro/Psych negative neurological ROS  negative psych ROS   GI/Hepatic Neg liver ROS,GERD  ,,  Endo/Other  negative endocrine ROS    Renal/GU negative Renal ROS  negative genitourinary   Musculoskeletal  (+) Arthritis , Osteoarthritis,    Abdominal   Peds negative pediatric ROS (+)  Hematology negative hematology ROS (+)   Anesthesia Other Findings   Reproductive/Obstetrics negative OB ROS                             Anesthesia Physical Anesthesia Plan  ASA: 3  Anesthesia Plan: Spinal   Post-op Pain Management: Regional block*   Induction: Intravenous  PONV Risk Score and Plan: 3 and Ondansetron, Dexamethasone, Propofol infusion and Treatment may vary due to age or medical condition  Airway Management Planned: Simple Face Mask  Additional Equipment:   Intra-op Plan:   Post-operative Plan:   Informed Consent: I have reviewed the patients History and Physical, chart, labs and discussed the procedure including the risks, benefits and alternatives for the proposed anesthesia with the patient or authorized representative who has indicated his/her understanding and acceptance.     Dental advisory given  Plan Discussed with: CRNA and Surgeon  Anesthesia Plan Comments:        Anesthesia Quick Evaluation

## 2022-08-12 NOTE — Transfer of Care (Signed)
Immediate Anesthesia Transfer of Care Note  Patient: Melissa [facility]  Procedure(s) Performed: LEFT TOTAL KNEE ARTHROPLASTY (Left: Knee)  Patient Location: PACU  Anesthesia Type:Spinal  Level of Consciousness: awake, alert , and oriented  Airway & Oxygen Therapy: Patient Spontanous Breathing and Patient connected to face mask oxygen  Post-op Assessment: Report given to RN and Post -op Vital signs reviewed and stable  Post vital signs: Reviewed and stable  Last Vitals:  Vitals Value Taken Time  BP 121/50 08/12/22 1352  Temp    Pulse 95 08/12/22 1353  Resp 11 08/12/22 1353  SpO2 97 % 08/12/22 1353  Vitals shown include unvalidated device data.  Last Pain:  Vitals:   08/12/22 0957  TempSrc:   PainSc: 6       Patients Stated Pain Goal: 3 (14/70/92 9574)  Complications: No notable events documented.

## 2022-08-12 NOTE — Interval H&P Note (Signed)
History and Physical Interval Note:  08/12/2022 11:05 AM  Melissa [facility]  has presented today for surgery, with the diagnosis of LEFT KNEE DEGENERATIVE JOINT DISEASE.  The various methods of treatment have been discussed with the patient and family. After consideration of risks, benefits and other options for treatment, the patient has consented to  Procedure(s): LEFT TOTAL KNEE ARTHROPLASTY (Left) as a surgical intervention.  The patient's history has been reviewed, patient examined, no change in status, stable for surgery.  I have reviewed the patient's chart and labs.  Questions were answered to the patient's satisfaction.     Hessie Dibble

## 2022-08-12 NOTE — Evaluation (Signed)
Physical Therapy Evaluation Patient Details Name: Melissa Allen MRN: [id number] DOB: 1966-04-30 Today's Date: 08/12/2022  History of Present Illness  Pt is a 56yo female presenting s/p L-TKA on 08/12/22. PMH: anginal pain, fibromyalgia, HTN, seizures, anterior-lateral lumbar fusion L3-L4 2019, posterior cervical laminectomy 2021, R-TSA 2022   Clinical Impression  Melissa Allen is a 56 y.o. female POD 0 s/p L-TKA. Patient reports independence with mobility at baseline though does report having frequent falls where her knees "give way." Patient is now limited by functional impairments (see PT problem list below) and requires min guard for bed mobility and mod assist for transfers, further mobility deferred secondary to anterior lean and numbness bilaterally, suspect secondary to spinal. Patient instructed in exercises to facilitate ROM and circulation to manage edema. Provided incentive spirometer and with Vcs pt able to achieve 1229m. Patient will benefit from continued skilled PT interventions to address impairments and progress towards PLOF. Acute PT will follow to progress mobility and stair training in preparation for safe discharge home.       Recommendations for follow up therapy are one component of a multi-disciplinary discharge planning process, led by the attending physician.  Recommendations may be updated based on patient status, additional functional criteria and insurance authorization.  Follow Up Recommendations Follow physician's recommendations for discharge plan and follow up therapies      Assistance Recommended at Discharge Frequent or constant Supervision/Assistance  Patient can return home with the following  A little help with walking and/or transfers;A little help with bathing/dressing/bathroom;Assistance with cooking/housework;Assist for transportation;Help with stairs or ramp for entrance    Equipment Recommendations None recommended by PT (pt has recommended DME)   Recommendations for Other Services       Functional Status Assessment Patient has had a recent decline in their functional status and demonstrates the ability to make significant improvements in function in a reasonable and predictable amount of time.     Precautions / Restrictions Precautions Precautions: Fall;Knee Precaution Booklet Issued: No Precaution Comments: hx of falls, most recent in August/september Restrictions Weight Bearing Restrictions: No Other Position/Activity Restrictions: wbat      Mobility  Bed Mobility Overal bed mobility: Needs Assistance Bed Mobility: Supine to Sit, Sit to Supine     Supine to sit: Modified independent (Device/Increase time), HOB elevated Sit to supine: Min guard   General bed mobility comments: increased time, no physical assist required, min guard    Transfers Overall transfer level: Needs assistance Equipment used: Rolling walker (2 wheels) Transfers: Sit to/from Stand Sit to Stand: Mod assist, From elevated surface           General transfer comment: Pt required mod assist for steadying due to anterior lean, suspect secondary to anesthesia, pt unable to feel feet on the floor nor glutes on the EOB. Pt unable to adjust positioning of feet in standing. Returned to supine, further mobility deferred.    Ambulation/Gait               General Gait Details: Unsafe at present  Stairs            Wheelchair Mobility    Modified Rankin (Stroke Patients Only)       Balance                                             Pertinent Vitals/Pain Pain Assessment Pain  Assessment: No/denies pain    Home Living Family/patient expects to be discharged to:: Private residence Living Arrangements: Spouse/significant other Available Help at Discharge: Family;Available 24 hours/day Type of Home: House Home Access: Stairs to enter Entrance Stairs-Rails: Left Entrance Stairs-Number of Steps: 1   Home  Layout: Two level;Able to live on main level with bedroom/bathroom Home Equipment: Rolling Walker (2 wheels);Cane - single point;BSC/3in1;Grab bars - tub/shower;Grab bars - toilet      Prior Function Prior Level of Function : Independent/Modified Independent;Driving;History of Falls (last six months)             Mobility Comments: IND, Falls: "just went down, like my knee gave out, I have some balance troubles, sometimes hard to step down off curbs ADLs Comments: IND     Hand Dominance   Dominant Hand: Right    Extremity/Trunk Assessment   Upper Extremity Assessment Upper Extremity Assessment: Overall WFL for tasks assessed    Lower Extremity Assessment Lower Extremity Assessment: RLE deficits/detail;LLE deficits/detail RLE Deficits / Details: MMT ank DF/PF 3+/5 breaks easily on resistance RLE Sensation: decreased light touch LLE Deficits / Details: Pt reports 5cm baker's cyst posterior knee visible on MRI. MMT ank DF/PF 3+/5 breaks easily on resistance LLE Sensation: decreased light touch    Cervical / Trunk Assessment Cervical / Trunk Assessment: Back Surgery;Neck Surgery;Kyphotic  Communication   Communication: No difficulties  Cognition Arousal/Alertness: Awake/alert Behavior During Therapy: WFL for tasks assessed/performed Overall Cognitive Status: Within Functional Limits for tasks assessed                                          General Comments      Exercises Total Joint Exercises Ankle Circles/Pumps: AROM, Both, 20 reps, Supine Quad Sets: AROM, Left, Other reps (comment), Supine (2) Heel Slides: AROM, Left, Other reps (comment), Supine (2) Hip ABduction/ADduction: AROM, Right, Other reps (comment), Supine (2) Straight Leg Raises: AROM, Right, Supine, Other reps (comment) (2) Goniometric ROM: -5-70 by gross visual approximation   Assessment/Plan    PT Assessment Patient needs continued PT services  PT Problem List Decreased  strength;Decreased range of motion;Decreased activity tolerance;Decreased balance;Decreased mobility;Decreased coordination;Pain       PT Treatment Interventions DME instruction;Gait training;Stair training;Functional mobility training;Therapeutic activities;Therapeutic exercise;Balance training;Patient/family education;Neuromuscular re-education    PT Goals (Current goals can be found in the Care Plan section)  Acute Rehab PT Goals Patient Stated Goal: To go home PT Goal Formulation: With patient Time For Goal Achievement: 08/19/22 Potential to Achieve Goals: Good    Frequency 7X/week     Co-evaluation               AM-PAC PT "6 Clicks" Mobility  Outcome Measure Help needed turning from your back to your side while in a flat bed without using bedrails?: A Little Help needed moving from lying on your back to sitting on the side of a flat bed without using bedrails?: A Little Help needed moving to and from a bed to a chair (including a wheelchair)?: A Little Help needed standing up from a chair using your arms (e.g., wheelchair or bedside chair)?: A Little Help needed to walk in hospital room?: A Little Help needed climbing 3-5 steps with a railing? : A Lot 6 Click Score: 17    End of Session Equipment Utilized During Treatment: Gait belt Activity Tolerance: Patient tolerated treatment well;No increased pain Patient  left: in bed;with call bell/phone within reach;with bed alarm set;with family/visitor present Nurse Communication: Mobility status PT Visit Diagnosis: Pain;Difficulty in walking, not elsewhere classified (R26.2) Pain - Right/Left: Left Pain - part of body: Knee    Time: 6384-5364 PT Time Calculation (min) (ACUTE ONLY): 19 min   Charges:   PT Evaluation $PT Eval Low Complexity: Omao, PT, DPT [facility] Office: [phone] Weekend pager: [phone]  Coolidge Breeze 08/12/2022, 5:18 PM

## 2022-08-12 NOTE — Anesthesia Procedure Notes (Signed)
Procedure Name: MAC Date/Time: 08/12/2022 11:58 AM  Performed by: Maxwell Caul, CRNAPre-anesthesia Checklist: Patient identified, Emergency Drugs available, Suction available and Patient being monitored Oxygen Delivery Method: Simple face mask

## 2022-08-12 NOTE — Anesthesia Procedure Notes (Signed)
Anesthesia Regional Block: Adductor canal block   Pre-Anesthetic Checklist: , timeout performed,  Correct Patient, Correct Site, Correct Laterality,  Correct Procedure, Correct Position, site marked,  Risks and benefits discussed,  Surgical consent,  Pre-op evaluation,  At surgeon's request and post-op pain management  Laterality: Left  Prep: chloraprep       Needles:  Injection technique: Single-shot  Needle Type: Echogenic Needle     Needle Length: 9cm      Additional Needles:   Procedures:,,,, ultrasound used (permanent image in chart),,    Narrative:  Start time: 08/12/2022 10:55 AM End time: 08/12/2022 11:03 AM Injection made incrementally with aspirations every 5 mL.  Performed by: Personally  Anesthesiologist: Myrtie Soman, MD  Additional Notes: Patient tolerated the procedure well without complications

## 2022-08-12 NOTE — Anesthesia Procedure Notes (Signed)
Spinal  Patient location during procedure: OR Start time: 08/12/2022 12:02 PM End time: 08/12/2022 12:07 PM Reason for block: surgical anesthesia Staffing Performed: anesthesiologist  Anesthesiologist: Myrtie Soman, MD Performed by: Myrtie Soman, MD Authorized by: Myrtie Soman, MD   Preanesthetic Checklist Completed: patient identified, IV checked, site marked, risks and benefits discussed, surgical consent, monitors and equipment checked, pre-op evaluation and timeout performed Spinal Block Patient position: sitting Prep: Betadine Patient monitoring: heart rate, continuous pulse ox and blood pressure Approach: midline Location: L4-5 Injection technique: single-shot Needle Needle type: Sprotte  Needle gauge: 24 G Needle length: 9 cm Assessment Sensory level: T6 Events: CSF return Additional Notes

## 2022-08-12 NOTE — Anesthesia Procedure Notes (Signed)
Anesthesia Procedure Image    

## 2022-08-13 DIAGNOSIS — M1712 Unilateral primary osteoarthritis, left knee: Secondary | ICD-10-CM | POA: Diagnosis not present

## 2022-08-13 MED ORDER — ASPIRIN 81 MG PO TBEC: 81.0000 mg | DELAYED_RELEASE_TABLET | Freq: Two times a day (BID) | ORAL | 0 refills | Status: DC

## 2022-08-13 NOTE — Progress Notes (Signed)
Physical Therapy Treatment Patient Details Name: Melissa Allen MRN: [id number] DOB: 1966-04-03 Today's Date: 08/13/2022   History of Present Illness Pt is a 56yo female presenting s/p L-TKA on 08/12/22. PMH: anginal pain, fibromyalgia, HTN, seizures, anterior-lateral lumbar fusion L3-L4 2019, posterior cervical laminectomy 2021, R-TSA 2022    PT Comments    Pt ambulated in hallway and performed LE exercises.  Pt anticipates d/c home after second session.    Recommendations for follow up therapy are one component of a multi-disciplinary discharge planning process, led by the attending physician.  Recommendations may be updated based on patient status, additional functional criteria and insurance authorization.  Follow Up Recommendations  Follow physician's recommendations for discharge plan and follow up therapies     Assistance Recommended at Discharge Frequent or constant Supervision/Assistance  Patient can return home with the following A little help with walking and/or transfers;A little help with bathing/dressing/bathroom;Assistance with cooking/housework;Assist for transportation;Help with stairs or ramp for entrance   Equipment Recommendations  None recommended by PT    Recommendations for Other Services       Precautions / Restrictions Precautions Precautions: Fall;Knee Precaution Comments: hx of falls, most recent in August/september Restrictions Other Position/Activity Restrictions: WBAT     Mobility  Bed Mobility Overal bed mobility: Needs Assistance Bed Mobility: Supine to Sit     Supine to sit: Modified independent (Device/Increase time), HOB elevated     General bed mobility comments: increased time, no physical assist required    Transfers Overall transfer level: Needs assistance Equipment used: Rolling walker (2 wheels) Transfers: Sit to/from Stand Sit to Stand: Min guard           General transfer comment: cues for hand placement, min/guard for  safety    Ambulation/Gait Ambulation/Gait assistance: Min guard Gait Distance (Feet): 80 Feet Assistive device: Rolling walker (2 wheels) Gait Pattern/deviations: Step-through pattern, Decreased stance time - left, Antalgic       General Gait Details: verbal cues for sequence, RW positioning, step length; good heel strike   Stairs             Wheelchair Mobility    Modified Rankin (Stroke Patients Only)       Balance                                            Cognition Arousal/Alertness: Awake/alert Behavior During Therapy: WFL for tasks assessed/performed Overall Cognitive Status: Within Functional Limits for tasks assessed                                          Exercises Total Joint Exercises Ankle Circles/Pumps: AROM, Both, 10 reps Quad Sets: AROM, Left, 10 reps Hip ABduction/ADduction: AROM, Left, 10 reps Straight Leg Raises: AROM, Left, 10 reps Knee Flexion: AROM, Left, Seated, 10 reps    General Comments        Pertinent Vitals/Pain Pain Assessment Pain Assessment: 0-10 Pain Score: 5  Pain Location: left thigh and posterior knee Pain Descriptors / Indicators: Aching, Sore Pain Intervention(s): Repositioned, Monitored during session    Home Living                          Prior Function  PT Goals (current goals can now be found in the care plan section) Progress towards PT goals: Progressing toward goals    Frequency    7X/week      PT Plan Current plan remains appropriate    Co-evaluation              AM-PAC PT "6 Clicks" Mobility   Outcome Measure  Help needed turning from your back to your side while in a flat bed without using bedrails?: A Little Help needed moving from lying on your back to sitting on the side of a flat bed without using bedrails?: A Little Help needed moving to and from a bed to a chair (including a wheelchair)?: A Little Help needed  standing up from a chair using your arms (e.g., wheelchair or bedside chair)?: A Little Help needed to walk in hospital room?: A Little Help needed climbing 3-5 steps with a railing? : A Little 6 Click Score: 18    End of Session Equipment Utilized During Treatment: Gait belt Activity Tolerance: Patient tolerated treatment well Patient left: in chair;with call bell/phone within reach   PT Visit Diagnosis: Pain;Difficulty in walking, not elsewhere classified (R26.2) Pain - Right/Left: Left Pain - part of body: Knee     Time: 1005-1020 PT Time Calculation (min) (ACUTE ONLY): 15 min  Charges:  $Therapeutic Exercise: 8-22 mins                     Jannette Spanner PT, DPT Physical Therapist Acute Rehabilitation Services Preferred contact method: Secure Chat Weekend Pager Only: [phone] Office: [phone]    [name] 08/13/2022, 2:08 PM

## 2022-08-13 NOTE — Progress Notes (Signed)
Patient discharged to home w/ husb. Given all belongings, instructions. Verbalized understanding of all instructions. Escorted to pov via w/c.

## 2022-08-13 NOTE — Progress Notes (Signed)
Physical Therapy Treatment Patient Details Name: Melissa Allen MRN: [id number] DOB: December 26, 1965 Today's Date: 08/13/2022   History of Present Illness Pt is a 56yo female presenting s/p L-TKA on 08/12/22. PMH: anginal pain, fibromyalgia, HTN, seizures, anterior-lateral lumbar fusion L3-L4 2019, posterior cervical laminectomy 2021, R-TSA 2022    PT Comments    Pt ambulated in hallway and practiced one step.  Pt provided with HEP handout.  Pt had no further questions and feels ready for d/c home today.    Recommendations for follow up therapy are one component of a multi-disciplinary discharge planning process, led by the attending physician.  Recommendations may be updated based on patient status, additional functional criteria and insurance authorization.  Follow Up Recommendations  Follow physician's recommendations for discharge plan and follow up therapies     Assistance Recommended at Discharge Frequent or constant Supervision/Assistance  Patient can return home with the following A little help with walking and/or transfers;A little help with bathing/dressing/bathroom;Assistance with cooking/housework;Assist for transportation;Help with stairs or ramp for entrance   Equipment Recommendations  None recommended by PT    Recommendations for Other Services       Precautions / Restrictions Precautions Precautions: Fall;Knee Precaution Comments: hx of falls, most recent in August/september Restrictions Other Position/Activity Restrictions: WBAT     Mobility  Bed Mobility Overal bed mobility: Modified Independent Bed Mobility: Supine to Sit     Supine to sit: Modified independent (Device/Increase time), HOB elevated     General bed mobility comments: increased time, no physical assist required    Transfers Overall transfer level: Needs assistance Equipment used: Rolling walker (2 wheels) Transfers: Sit to/from Stand Sit to Stand: Supervision           General  transfer comment: pt able to get dressed sitting EOB without assist    Ambulation/Gait Ambulation/Gait assistance: Min guard, Supervision Gait Distance (Feet): 40 Feet Assistive device: Rolling walker (2 wheels) Gait Pattern/deviations: Step-through pattern, Decreased stance time - left, Antalgic       General Gait Details: verbal cues for sequence, RW positioning, step length; good heel strike   Stairs Stairs: Yes Stairs assistance: Min guard Stair Management: Step to pattern, Forwards, With walker Number of Stairs: 1 General stair comments: verbal cues for RW positioning and sequencing; performed twice   Wheelchair Mobility    Modified Rankin (Stroke Patients Only)       Balance                                            Cognition Arousal/Alertness: Awake/alert Behavior During Therapy: WFL for tasks assessed/performed Overall Cognitive Status: Within Functional Limits for tasks assessed                                          Exercises   General Comments        Pertinent Vitals/Pain Pain Assessment Pain Assessment: 0-10 Pain Score: 6  Pain Location: left thigh Pain Descriptors / Indicators: Tightness, Sore Pain Intervention(s): Repositioned, Monitored during session, Heat applied (educated heat only to muscle belly and not near incision)    Home Living                          Prior Function  PT Goals (current goals can now be found in the care plan section) Progress towards PT goals: Progressing toward goals    Frequency    7X/week      PT Plan Current plan remains appropriate    Co-evaluation              AM-PAC PT "6 Clicks" Mobility   Outcome Measure  Help needed turning from your back to your side while in a flat bed without using bedrails?: A Little Help needed moving from lying on your back to sitting on the side of a flat bed without using bedrails?: A Little Help  needed moving to and from a bed to a chair (including a wheelchair)?: A Little Help needed standing up from a chair using your arms (e.g., wheelchair or bedside chair)?: A Little Help needed to walk in hospital room?: A Little Help needed climbing 3-5 steps with a railing? : A Little 6 Click Score: 18    End of Session Equipment Utilized During Treatment: Gait belt Activity Tolerance: Patient tolerated treatment well Patient left: in bed;with call bell/phone within reach   PT Visit Diagnosis: Pain;Difficulty in walking, not elsewhere classified (R26.2) Pain - Right/Left: Left Pain - part of body: Knee     Time: 6546-5035 PT Time Calculation (min) (ACUTE ONLY): 13 min  Charges:  $Gait Training: 8-22 mins                     Arlyce Dice, DPT Physical Therapist Acute Rehabilitation Services Preferred contact method: Secure Chat Weekend Pager Only: [phone] Office: [phone]   Melissa Allen Payson 08/13/2022, 2:14 PM

## 2022-08-13 NOTE — Discharge Summary (Signed)
Patient ID: CATALYNA REILLY MRN: [id number] DOB/AGE: 57-Jul-1967 57 y.o.  Admit date: 08/12/2022 Discharge date: 08/13/2022  Admission Diagnoses:  Principal Problem:   Primary osteoarthritis of left knee Active Problems:   Primary localized osteoarthritis of left knee   Discharge Diagnoses:  Same  Past Medical History:  Diagnosis Date   Allergy    Anginal pain ([place])    one episode 4-5 years ago-[facility]-nonspecific-no problems since-   Anxiety    Arthritis    multiple areas of arthritis- DDD   Asthma    recent admit to ER 01/11/12 for exac of asthma   Calcaneal fracture    Cancer ([place])    Duplicated ureter, right    Family history of breast cancer    Family history of colon cancer    Family history of ovarian cancer    Family history of pancreatic cancer    Fibromyalgia    History of kidney stones    Hypertension    Obesity (BMI 30.0-34.9)    Pancreatitis 1986   Pneumonia    PONV (postoperative nausea and vomiting)    Poor venous access    hard to start iv-   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 female who was admitted 08/12/2022 for operative treatment ofPrimary osteoarthritis of left knee. Patient has severe unremitting pain that affects sleep, daily activities, and work/hobbies. After pre-op clearance the patient was taken to the operating room on 08/12/2022 and underwent  Procedure(s): LEFT TOTAL KNEE ARTHROPLASTY.    Patient was given perioperative antibiotics:  Anti-infectives (From admission, onward)    Start     Dose/Rate Route Frequency Ordered Stop   08/12/22 1800  ceFAZolin (ANCEF) IVPB 2g/100 mL premix        2 g 200  mL/hr over 30 Minutes Intravenous Every 6 hours 08/12/22 1611 08/13/22 0213   08/12/22 0945  ceFAZolin (ANCEF) IVPB 2g/100 mL premix        2 g 200 mL/hr over 30 Minutes Intravenous On call to O.R. 08/12/22 0943 08/12/22 1208        Patient was given sequential compression devices, early ambulation, and chemoprophylaxis to prevent DVT.  Patient benefited maximally from hospital stay and there were no complications.    Recent vital signs: Patient Vitals for the past 24 hrs:  BP Temp Temp src Pulse Resp SpO2 Height Weight  08/13/22 0602 109/65 97.6 F (36.4 C) -- 95 18 100 % -- --  08/13/22 0144 (!) 96/54 97.7 F (36.5 C) Oral 98 18 98 % -- --  08/12/22 2102 104/66 98.5 F (36.9 C) Oral (!) 109 16 96 % -- --  08/12/22 1615 116/73 98.1 F (36.7 C) Oral 80 15 99 % -- --  08/12/22 1600 124/86 98.4 F (36.9 C) -- 85 18 99 % -- --  08/12/22 1545 (!) 105/94 -- -- 95 19 95 % -- --  08/12/22 1530 115/89 -- -- 89 11 96 % -- --  08/12/22 1515 119/69 -- -- 93 12 97 % -- --  08/12/22 1500 126/76 -- -- 93 (!) 21 100 % -- --  08/12/22 1445 121/82 98 F (  36.7 C) -- 83 13 98 % -- --  08/12/22 1430 117/75 -- -- 95 15 98 % -- --  08/12/22 1415 114/69 -- -- 92 13 99 % -- --  08/12/22 1400 123/70 -- -- 94 11 94 % -- --  08/12/22 1352 (!) 121/50 98.4 F (36.9 C) -- 93 (!) 22 98 % -- --  08/12/22 1121 -- -- -- (!) 113 14 94 % -- --  08/12/22 1116 126/74 -- -- (!) 109 (!) 8 93 % -- --  08/12/22 1111 -- -- -- (!) 109 10 96 % -- --  08/12/22 1106 -- -- -- (!) 115 20 98 % -- --  08/12/22 1101 138/78 -- -- (!) 121 18 100 % -- --  08/12/22 1059 -- -- -- (!) 116 18 98 % -- --  08/12/22 0957 -- -- -- -- -- -- '5\' 4"'$  (1.626 m) 83 kg  08/12/22 0956 135/75 98.2 F (36.8 C) Oral (!) 108 17 97 % -- --     Recent laboratory studies: No results for input(s): "WBC", "HGB", "HCT", "PLT", "NA", "K", "CL", "CO2", "BUN", "CREATININE", "GLUCOSE", "INR", "CALCIUM" in the last 72 hours.  Invalid input(s): "PT",  "2"   Discharge Medications:   Allergies as of 08/13/2022       Reactions   Chlorquinaldol Rash   11/30/19 patch testing: positive reaction to Quinoline Mix (contains clioquinol and chlorquinaldol)   Clioquinol Rash   11/30/19 patch testing: positive reaction to Quinoline Mix (contains clioquinol and chlorquinaldol)   Other Anaphylaxis   Pt has anaphylactic reaction to something during procedure - unsure of what  Per patient, Quinoline Mix  - based on allergy testing   Latex Itching, Swelling, Dermatitis   SWELLING FROM FACE MASK RING AFTER LAST SURGERY   Lisinopril Cough      Codeine Nausea And Vomiting   Hydrocodone Itching   Tape Rash   TOLERATES PAPER TAPE ONLY NO PINK SURGICAL TAPE EITHER, EKG Leads    Wound Dressing Adhesive Rash   Dermabond        Medication List     TAKE these medications    albuterol 108 (90 Base) MCG/ACT inhaler Commonly known as: ProAir HFA INHALE TWO PUFFS EVERY FOUR HOURS AS NEEDED SHORTNESS OF BREATH   ARIPiprazole 10 MG tablet Commonly known as: ABILIFY Take 10 mg by mouth daily.   aspirin EC 81 MG tablet Take 1 tablet (81 mg total) by mouth 2 (two) times daily after a meal. For 2 weeks then once a day for 2 weeks for DVT prevention.   doxepin 10 MG capsule Commonly known as: SINEQUAN Take 20 mg by mouth at bedtime.   DULoxetine 30 MG capsule Commonly known as: CYMBALTA Take 90 mg by mouth at bedtime.   EPINEPHrine 0.3 mg/0.3 mL Soaj injection Commonly known as: EPI-PEN Inject 0.3 mLs (0.3 mg total) into the muscle as needed for anaphylaxis.   estradiol 1 MG tablet Commonly known as: ESTRACE TAKE 1 TABLET BY MOUTH ONCE A DAY   Eszopiclone 3 MG Tabs Take 3 mg by mouth at bedtime.   fluticasone 50 MCG/ACT nasal spray Commonly known as: FLONASE USE 2 SPRAYS INTO EACH NOSTRIL ONCE DAILY AS DIRECTED   HYDROcodone-acetaminophen 10-325 MG tablet Commonly known as: NORCO Take 1 tablet by mouth 4 (four) times daily as needed for  moderate pain.   levocetirizine 5 MG tablet Commonly known as: XYZAL Take 5 mg by mouth every evening.   losartan 50 MG tablet  Commonly known as: COZAAR Take 1 tablet (50 mg total) by mouth daily. What changed: when to take this   multivitamin with minerals Tabs tablet Take 1 tablet by mouth daily.   QUEtiapine 400 MG tablet Commonly known as: SEROQUEL Take 400 mg by mouth at bedtime.   Restasis 0.05 % ophthalmic emulsion Generic drug: cycloSPORINE Place 1 drop into both eyes 2 (two) times daily.   sulfamethoxazole-trimethoprim 800-160 MG tablet Commonly known as: BACTRIM DS Take 1 tablet by mouth 2 (two) times daily.   tiZANidine 4 MG tablet Commonly known as: ZANAFLEX Take 6 mg by mouth 3 (three) times daily.   triamterene-hydrochlorothiazide 37.5-25 MG tablet Commonly known as: MAXZIDE-25 TAKE 1 TABLET BY MOUTH ONCE A DAY               Durable Medical Equipment  (From admission, onward)           Start     Ordered   08/12/22 1612  DME Walker rolling  Once       Question:  Patient needs a walker to treat with the following condition  Answer:  Primary osteoarthritis of left knee   08/12/22 1611   08/12/22 1612  DME 3 n 1  Once        08/12/22 1611   08/12/22 1612  DME Bedside commode  Once       Question:  Patient needs a bedside commode to treat with the following condition  Answer:  Primary osteoarthritis of left knee   08/12/22 1611            Diagnostic Studies: DG Chest 2 View  Result Date: 08/05/2022 CLINICAL DATA:  56 year old female with preoperative chest x-ray EXAM: CHEST - 2 VIEW COMPARISON:  08/12/2021 FINDINGS: Cardiomediastinal silhouette unchanged in size and contour. No evidence of central vascular congestion. No interlobular septal thickening. Low lung volumes persist. No pneumothorax or pleural effusion. Coarsened interstitial markings, with no confluent airspace disease. No acute displaced fracture. Degenerative changes of the  spine. Surgical changes of the cervical region. Surgical changes of bilateral chest wall IMPRESSION: Negative for acute cardiopulmonary disease Electronically Signed   By: Corrie Mckusick D.O.   On: 08/05/2022 18:29    Disposition: Discharge disposition: 01-Home or Self Care       Discharge Instructions     Call MD / Call 911   Complete by: As directed    If you experience chest pain or shortness of breath, CALL 911 and be transported to the hospital emergency room.  If you develope a fever above 101 F, pus (white drainage) or increased drainage or redness at the wound, or calf pain, call your surgeon's office.   Constipation Prevention   Complete by: As directed    Drink plenty of fluids.  Prune juice may be helpful.  You may use a stool softener, such as Colace (over the counter) 100 mg twice a day.  Use MiraLax (over the counter) for constipation as needed.   Diet - low sodium heart healthy   Complete by: As directed    Discharge instructions   Complete by: As directed    INSTRUCTIONS AFTER JOINT REPLACEMENT   Remove items at home which could result in a fall. This includes throw rugs or furniture in walking pathways ICE to the affected joint every three hours while awake for 30 minutes at a time, for at least the first 3-5 days, and then as needed for pain and swelling.  Continue to use  ice for pain and swelling. You may notice swelling that will progress down to the foot and ankle.  This is normal after surgery.  Elevate your leg when you are not up walking on it.   Continue to use the breathing machine you got in the hospital (incentive spirometer) which will help keep your temperature down.  It is common for your temperature to cycle up and down following surgery, especially at night when you are not up moving around and exerting yourself.  The breathing machine keeps your lungs expanded and your temperature down.   DIET:  As you were doing prior to hospitalization, we recommend a  well-balanced diet.  DRESSING / WOUND CARE / SHOWERING  You may shower 3 days after surgery, but keep the wounds dry during showering.  You may use an occlusive plastic wrap (Press'n Seal for example), NO SOAKING/SUBMERGING IN THE BATHTUB.  If the bandage gets wet, change with a clean dry gauze.  If the incision gets wet, pat the wound dry with a clean towel.  ACTIVITY  Increase activity slowly as tolerated, but follow the weight bearing instructions below.   No driving for 6 weeks or until further direction given by your physician.  You cannot drive while taking narcotics.  No lifting or carrying greater than 10 lbs. until further directed by your surgeon. Avoid periods of inactivity such as sitting longer than an hour when not asleep. This helps prevent blood clots.  You may return to work once you are authorized by your doctor.     WEIGHT BEARING   Weight bearing as tolerated with assist device (walker, cane, etc) as directed, use it as long as suggested by your surgeon or therapist, typically at least 4-6 weeks.   EXERCISES  Results after joint replacement surgery are often greatly improved when you follow the exercise, range of motion and muscle strengthening exercises prescribed by your doctor. Safety measures are also important to protect the joint from further injury. Any time any of these exercises cause you to have increased pain or swelling, decrease what you are doing until you are comfortable again and then slowly increase them. If you have problems or questions, call your caregiver or physical therapist for advice.   Rehabilitation is important following a joint replacement. After just a few days of immobilization, the muscles of the leg can become weakened and shrink (atrophy).  These exercises are designed to build up the tone and strength of the thigh and leg muscles and to improve motion. Often times heat used for twenty to thirty minutes before working out will loosen up  your tissues and help with improving the range of motion but do not use heat for the first two weeks following surgery (sometimes heat can increase post-operative swelling).   These exercises can be done on a training (exercise) mat, on the floor, on a table or on a bed. Use whatever works the best and is most comfortable for you.    Use music or television while you are exercising so that the exercises are a pleasant break in your day. This will make your life better with the exercises acting as a break in your routine that you can look forward to.   Perform all exercises about fifteen times, three times per day or as directed.  You should exercise both the operative leg and the other leg as well.  Exercises include:   Quad Sets - Tighten up the muscle on the front of the thigh (  Quad) and hold for 5-10 seconds.   Straight Leg Raises - With your knee straight (if you were given a brace, keep it on), lift the leg to 60 degrees, hold for 3 seconds, and slowly lower the leg.  Perform this exercise against resistance later as your leg gets stronger.  Leg Slides: Lying on your back, slowly slide your foot toward your buttocks, bending your knee up off the floor (only go as far as is comfortable). Then slowly slide your foot back down until your leg is flat on the floor again.  Angel Wings: Lying on your back spread your legs to the side as far apart as you can without causing discomfort.  Hamstring Strength:  Lying on your back, push your heel against the floor with your leg straight by tightening up the muscles of your buttocks.  Repeat, but this time bend your knee to a comfortable angle, and push your heel against the floor.  You may put a pillow under the heel to make it more comfortable if necessary.   A rehabilitation program following joint replacement surgery can speed recovery and prevent re-injury in the future due to weakened muscles. Contact your doctor or a physical therapist for more information  on knee rehabilitation.    CONSTIPATION  Constipation is defined medically as fewer than three stools per week and severe constipation as less than one stool per week.  Even if you have a regular bowel pattern at home, your normal regimen is likely to be disrupted due to multiple reasons following surgery.  Combination of anesthesia, postoperative narcotics, change in appetite and fluid intake all can affect your bowels.   YOU MUST use at least one of the following options; they are listed in order of increasing strength to get the job done.  They are all available over the counter, and you may need to use some, POSSIBLY even all of these options:    Drink plenty of fluids (prune juice may be helpful) and high fiber foods Colace 100 mg by mouth twice a day  Senokot for constipation as directed and as needed Dulcolax (bisacodyl), take with full glass of water  Miralax (polyethylene glycol) once or twice a day as needed.  If you have tried all these things and are unable to have a bowel movement in the first 3-4 days after surgery call either your surgeon or your primary doctor.    If you experience loose stools or diarrhea, hold the medications until you stool forms back up.  If your symptoms do not get better within 1 week or if they get worse, check with your doctor.  If you experience "the worst abdominal pain ever" or develop nausea or vomiting, please contact the office immediately for further recommendations for treatment.   ITCHING:  If you experience itching with your medications, try taking only a single pain pill, or even half a pain pill at a time.  You can also use Benadryl over the counter for itching or also to help with sleep.   TED HOSE STOCKINGS:  Use stockings on both legs until for at least 2 weeks or as directed by physician office. They may be removed at night for sleeping.  MEDICATIONS:  See your medication summary on the "After Visit Summary" that nursing will review with  you.  You may have some home medications which will be placed on hold until you complete the course of blood thinner medication.  It is important for you to complete the  blood thinner medication as prescribed.  PRECAUTIONS:  If you experience chest pain or shortness of breath - call 911 immediately for transfer to the hospital emergency department.   If you develop a fever greater that 101 F, purulent drainage from wound, increased redness or drainage from wound, foul odor from the wound/dressing, or calf pain - CONTACT YOUR SURGEON.                                                   FOLLOW-UP APPOINTMENTS:  If you do not already have a post-op appointment, please call the office for an appointment to be seen by your surgeon.  Guidelines for how soon to be seen are listed in your "After Visit Summary", but are typically between 1-4 weeks after surgery.  OTHER INSTRUCTIONS:   Knee Replacement:  Do not place pillow under knee, focus on keeping the knee straight while resting. CPM instructions: 0-90 degrees, 2 hours in the morning, 2 hours in the afternoon, and 2 hours in the evening. Place foam block, curve side up under heel at all times except when in CPM or when walking.  DO NOT modify, tear, cut, or change the foam block in any way.  POST-OPERATIVE OPIOID TAPER INSTRUCTIONS: It is important to wean off of your opioid medication as soon as possible. If you do not need pain medication after your surgery it is ok to stop day one. Opioids include: Codeine, Hydrocodone(Norco, Vicodin), Oxycodone(Percocet, oxycontin) and hydromorphone amongst others.  Long term and even short term use of opiods can cause: Increased pain response Dependence Constipation Depression Respiratory depression And more.  Withdrawal symptoms can include Flu like symptoms Nausea, vomiting And more Techniques to manage these symptoms Hydrate well Eat regular healthy meals Stay active Use relaxation techniques(deep  breathing, meditating, yoga) Do Not substitute Alcohol to help with tapering If you have been on opioids for less than two weeks and do not have pain than it is ok to stop all together.  Plan to wean off of opioids This plan should start within one week post op of your joint replacement. Maintain the same interval or time between taking each dose and first decrease the dose.  Cut the total daily intake of opioids by one tablet each day Next start to increase the time between doses. The last dose that should be eliminated is the evening dose.     MAKE SURE YOU:  Understand these instructions.  Get help right away if you are not doing well or get worse.    Thank you for letting us be a part of your medical care team.  It is a privilege we respect greatly.  We hope these instructions will help you stay on track for a fast and full recovery!   Increase activity slowly as tolerated   Complete by: As directed    Post-operative opioid taper instructions:   Complete by: As directed    POST-OPERATIVE OPIOID TAPER INSTRUCTIONS: It is important to wean off of your opioid medication as soon as possible. If you do not need pain medication after your surgery it is ok to stop day one. Opioids include: Codeine, Hydrocodone(Norco, Vicodin), Oxycodone(Percocet, oxycontin) and hydromorphone amongst others.  Long term and even short term use of opiods can cause: Increased pain response Dependence Constipation Depression Respiratory depression And more.  Withdrawal symptoms can  include Flu like symptoms Nausea, vomiting And more Techniques to manage these symptoms Hydrate well Eat regular healthy meals Stay active Use relaxation techniques(deep breathing, meditating, yoga) Do Not substitute Alcohol to help with tapering If you have been on opioids for less than two weeks and do not have pain than it is ok to stop all together.  Plan to wean off of opioids This plan should start within one  week post op of your joint replacement. Maintain the same interval or time between taking each dose and first decrease the dose.  Cut the total daily intake of opioids by one tablet each day Next start to increase the time between doses. The last dose that should be eliminated is the evening dose.           Follow-up Information     Melrose Nakayama, MD. Schedule an appointment as soon as possible for a visit in 2 week(s).   Specialty: Orthopedic Surgery Contact information: [place] [address] [phone]                  Signed: Larwance Sachs Devarion Mcclanahan 08/13/2022, 8:21 AM

## 2022-08-13 NOTE — TOC Transition Note (Signed)
Transition of Care [facility]) - CM/SW Discharge Note   Patient Details  Name: Melissa Allen MRN: [id number] Date of Birth: Aug 24, 1966  [facility] CM/SW Contact:  Lennart Pall, LCSW Phone Number: 08/13/2022, 10:48 AM   Clinical Narrative:    Met with pt and confirming she has needed DME at home.  HHPT prearranged with [facility].  No further TOC needs.   Final next level of care: [place] Barriers to Discharge: No Barriers Identified   Patient Goals and CMS Choice Patient states their goals for this hospitalization and ongoing recovery are:: return home      Discharge Placement                       Discharge Plan and Services                DME Arranged: N/A DME Agency: NA       HH Arranged: PT [place] Agency: [facility]        Social Determinants of Health (SDOH) Interventions     Readmission Risk Interventions     No data to display

## 2022-08-13 NOTE — Anesthesia Postprocedure Evaluation (Signed)
Anesthesia Post Note  Patient: Melissa Allen  Procedure(s) Performed: LEFT TOTAL KNEE ARTHROPLASTY (Left: Knee)     Patient location during evaluation: PACU Anesthesia Type: Spinal Level of consciousness: oriented and awake and alert Pain management: pain level controlled Vital Signs Assessment: post-procedure vital signs reviewed and stable Respiratory status: spontaneous breathing, respiratory function stable and patient connected to nasal cannula oxygen Cardiovascular status: blood pressure returned to baseline and stable Postop Assessment: no headache, no backache and no apparent nausea or vomiting Anesthetic complications: no  No notable events documented.  Last Vitals:  Vitals:   08/13/22 0602 08/13/22 0910  BP: 109/65 101/63  Pulse: 95 (!) 102  Resp: 18 18  Temp: 36.4 C 36.8 C  SpO2: 100% 99%    Last Pain:  Vitals:   08/13/22 1000  TempSrc:   PainSc: 8                  Genella Bas S

## 2022-08-13 NOTE — Progress Notes (Signed)
Subjective: 1 Day Post-Op Procedure(s) (LRB): LEFT TOTAL KNEE ARTHROPLASTY (Left)  Patient doing well. She is hoping to go home today.   Activity level:  wbat Diet tolerance:  ok Voiding:  ok Patient reports pain as mild.    Objective: Vital signs in last 24 hours: Temp:  [97.6 F (36.4 C)-98.5 F (36.9 C)] 97.6 F (36.4 C) (12/06 0602) Pulse Rate:  [80-121] 95 (12/06 0602) Resp:  [8-22] 18 (12/06 0602) BP: (96-138)/(50-94) 109/65 (12/06 0602) SpO2:  [93 %-100 %] 100 % (12/06 0602) Weight:  [83 kg] 83 kg (12/05 0957)  Labs: No results for input(s): "HGB" in the last 72 hours. No results for input(s): "WBC", "RBC", "HCT", "PLT" in the last 72 hours. No results for input(s): "NA", "K", "CL", "CO2", "BUN", "CREATININE", "GLUCOSE", "CALCIUM" in the last 72 hours. No results for input(s): "LABPT", "INR" in the last 72 hours.  Physical Exam:  Neurologically intact ABD soft Neurovascular intact Sensation intact distally Intact pulses distally Dorsiflexion/Plantar flexion intact Incision: dressing C/D/I and no drainage No cellulitis present Compartment soft  Assessment/Plan:  1 Day Post-Op Procedure(s) (LRB): LEFT TOTAL KNEE ARTHROPLASTY (Left) Advance diet Up with therapy D/C IV fluids Discharge home with home health today if cleared by PT and doing well. Follow up in office 7-10 days post op. Continue on '81mg'$  asa BID for 2 weeks for DVT prevention.    Larwance Sachs [employer] [place] 08/13/2022, 8:15 AM

## 2022-08-14 ENCOUNTER — Encounter (HOSPITAL_COMMUNITY): Payer: Self-pay | Admitting: Orthopaedic Surgery

## 2022-08-20 ENCOUNTER — Other Ambulatory Visit: Payer: Self-pay

## 2022-08-20 DIAGNOSIS — K862 Cyst of pancreas: Secondary | ICD-10-CM

## 2022-09-19 ENCOUNTER — Other Ambulatory Visit: Payer: Self-pay | Admitting: Internal Medicine

## 2022-09-19 ENCOUNTER — Ambulatory Visit (HOSPITAL_COMMUNITY)
Admission: RE | Admit: 2022-09-19 | Discharge: 2022-09-19 | Disposition: A | Discharge status: Home / Self Care | Payer: BC Managed Care – PPO | Source: Ambulatory Visit | Attending: Internal Medicine | Admitting: Internal Medicine

## 2022-09-19 DIAGNOSIS — K862 Cyst of pancreas: Secondary | ICD-10-CM

## 2022-09-19 MED ORDER — GADOBUTROL 1 MMOL/ML IV SOLN
8.0000 mL | Freq: Once | INTRAVENOUS | Status: AC | PRN
  Administered 2022-09-19: 8 mL via INTRAVENOUS

## 2022-11-06 ENCOUNTER — Other Ambulatory Visit (HOSPITAL_COMMUNITY): Payer: Self-pay

## 2022-11-06 MED ORDER — OXYCODONE-ACETAMINOPHEN 5-325 MG PO TABS
ORAL_TABLET | ORAL | 0 refills | Status: DC
  Filled 2022-11-06: qty 120, 30d supply, fill #0

## 2022-11-06 MED ORDER — TIZANIDINE HCL 4 MG PO TABS
ORAL_TABLET | ORAL | 2 refills | Status: AC
  Filled 2022-11-06: qty 135, 30d supply, fill #0

## 2022-11-07 ENCOUNTER — Other Ambulatory Visit (HOSPITAL_COMMUNITY): Payer: Self-pay

## 2022-12-03 ENCOUNTER — Ambulatory Visit: Payer: BC Managed Care – PPO | Admitting: Family Medicine

## 2022-12-03 ENCOUNTER — Encounter: Payer: Self-pay | Admitting: Family Medicine

## 2022-12-03 VITALS — BP 120/75 | HR 103 | Wt 181.0 lb

## 2022-12-03 DIAGNOSIS — R102 Pelvic and perineal pain: Secondary | ICD-10-CM | POA: Diagnosis not present

## 2022-12-03 NOTE — Progress Notes (Signed)
RGYN pt here to day with complaints of pelvic pain. 6/10x , Pt describes pain as menstrual cramps has been ongoing for 2 months now. Also feels bloated  Pain makes pt sick to her stomach notes no desire to eat.

## 2022-12-03 NOTE — Assessment & Plan Note (Signed)
She is s/p RATH with bilateral salpingectomy. Her ovaries remain. She is negative for pathologic gene mutations. Check u/s and ensure there is nothing going on with her ovaries.

## 2022-12-03 NOTE — Progress Notes (Addendum)
   Subjective:    Patient ID: Melissa Allen is a 57 y.o. female presenting with Pelvic Pain  on 12/03/2022  HPI: Patient is s/p hyst with Dr. Hulan Fray and tube removal. 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 Reports pelvic pain which feel like bad menstrual cramps. These feel in the midline. There x 2 months. Not there all the time and not sure what makes it come. Nothing improves the pain. Feels bloated and achiness. Normal bowel movement. Has h/o kidney stone which was in the bladder, but doesn't feel like this type of pain.  Review of Systems  Constitutional:  Negative for chills and fever.  Respiratory:  Negative for shortness of breath.   Cardiovascular:  Negative for chest pain.  Gastrointestinal:  Negative for abdominal pain, nausea and vomiting.  Genitourinary:  Negative for dysuria.  Skin:  Negative for rash.      Objective:    BP 120/75   Pulse (!) 103   Wt 181 lb (82.1 kg)   LMP 12/07/2011   BMI 31.07 kg/m  Physical Exam Exam conducted with a chaperone present.  Constitutional:      General: She is not in acute distress.    Appearance: She is well-developed.  HENT:     Head: Normocephalic and atraumatic.  Eyes:     General: No scleral icterus. Cardiovascular:     Rate and Rhythm: Normal rate.  Pulmonary:     Effort: Pulmonary effort is normal.  Abdominal:     Palpations: Abdomen is soft.  Genitourinary:    Labia:        Right: No rash.        Left: No rash.      Vagina: Normal.     Comments: Uterus and cervix are surgically absent. Vaginal cuff intact without lesion. Diffuse pelvic tenderness, no discrete mass noted but exam limited by body habitus and absence of uterus Musculoskeletal:     Cervical back: Neck supple.  Skin:    General: Skin is warm and dry.  Neurological:     Mental Status: She is alert and oriented to person, place, and time.         Assessment & Plan:   Problem List Items Addressed This Visit       Unprioritized    Pelvic pain - Primary    She is s/p RATH with bilateral salpingectomy. Her ovaries remain. She is negative for pathologic gene mutations. Check u/s and ensure there is nothing going on with her ovaries.      Relevant Orders   US PELVIC COMPLETE WITH TRANSVAGINAL    Return in about 4 weeks (around 12/31/2022) for needs U/S.  Donnamae Jude, MD 12/03/2022 11:46 AM

## 2022-12-11 ENCOUNTER — Encounter: Payer: Self-pay | Admitting: Family Medicine

## 2022-12-11 ENCOUNTER — Ambulatory Visit
Admission: RE | Admit: 2022-12-11 | Discharge: 2022-12-11 | Disposition: A | Discharge status: Home / Self Care | Payer: BC Managed Care – PPO | Source: Ambulatory Visit | Attending: Family Medicine | Admitting: Family Medicine

## 2022-12-11 DIAGNOSIS — R102 Pelvic and perineal pain: Secondary | ICD-10-CM | POA: Diagnosis present

## 2023-01-07 ENCOUNTER — Encounter: Payer: Self-pay | Admitting: Internal Medicine

## 2023-03-26 NOTE — H&P (Signed)
Subjective:      HPI   Last seen 08/2021. She has since had consultation with Dr. Myrtis Ser 09/2021 who counseled that he did not have a different approach with exception use of different mesh for stabilization implant and recommended weight loss. Wt down 9 lb since last visit her. Notes father passed from leukemia earlier this year.    Presented following screening MMG showing 9 cm area pleomorphic calcifications involving all 4 quadrants. Diagnostic MMG/US 6 cm area of calcifications in the left UOQ (1-3 o'clock); 4 cm segmental area of calcifications in left medial breast at 9 o'clock. Biopsy labeled lat upper quadrant and medial mid to lower both showed high grade DCIS with calcifications and necrosis, ER/PR+.   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   Mother with pancreatic ca. MGM with breast and ovarian ca. Genetics negative.   History subglandular augmentation mammaplasty with saline implants Dr. Stephens November. On imaging work up prior to mastectomies, bilateral rupture present.   Prior 11 D. Would not mind smaller volume. Right mastectomy 1175 g Left mastectomy 1374 g   PMH significant for HTN, PONV, fibromyalgia, multiple bouts pancreatitis, chronic back pain on oxycodone, has pain contract with Dr. Vear Clock. Notes she was unable for several years to raise arm or turn head and was unable to complete MMG during this time.   Worked as a Dispensing optician; stopped teaching due to back issues. Recently started part time work at [employer]. Lives with spouse who is retired Runner, broadcasting/film/video. Three adult daughters.    Intraop 5.23.22 findings: "Significant lateral displacement bilateral expanders noted preoperatively. Intraoperatively bilateral acellular dermis non adherent to majority anterior mastectomy flap and underlying pectoralis muscle. Non adherent acellular dermis excised. "   Intraop 9.8.22 findings: "Intraoperatively bilateral acellular dermis inset to anterior  mastectomy flap intact and inset to pectoralis muscle not intact. Non adherent acellular dermis excised."   Review of Systems     Objective:  Physical Exam    Pulm: clear to auscultation CV: normal heart sounds , tachycardic Lymph: no axillary adenopathy Chest:  soft bilateral no masses Bilateral lateral chest wall soft tissue rolls No rippling noted when bending forward  in supine position bilateral lateral movement implants appr 5-6 cm Left implant AP malposition- implant stamp palpable left chest    Assessment:    Left breast DCIS Hx augmentation mammaplasty-ruptured saline implant S/p bilateral SRM, left SLN, bilateral prepectoral TE/ADM (Alloderm) reconstruction S/p removal bilateral TE, placement silicone implants, ADM (Alloderm) S/p silicone implant exchange, Strattice bilateral chest Left AP malposition   Plan:      Recurrent lateral displacement implants. Also left AP malposition noted last visit on [place]. This has occurred post revision with additional Alloderm and Strattice. We discussed that I do not have alternative meshes available to me. If we proceed with revision surgery I will try permanent suture capsulorraphy, and have Alloderm available for possible use, with silicone implant exchange. Plan continue with same volume, projection implants. Counseled  there is no guarantee of outcome.  Patient bothered by soft tissue rolls lateral chest wall and plan liposuction to this area. Reviewed no guarantee outcome will not be similar.   Additional risks including but not limited to bleeding hematoma seroma damage to adjacent structures infection asymmetry unacceptable cosmetic result need for additional surgery blood clots in legs or lungs reviewed.   Reviewed OP surgery, possible drains, post operative limitations. Completed Eugenie Filler physician patient checklist.  Discussed will defer all pain  medication to pain provider. Rx for Bactrim given.    Natrelle Soft Touch  Extra Projection 700 ml smooth round implants placed bilateral. REF SSX-700   Glenna Fellows, MD [facility] Plastic & Reconstructive Surgery  Office/ physician access line after hours [phone]

## 2023-04-03 ENCOUNTER — Encounter (HOSPITAL_BASED_OUTPATIENT_CLINIC_OR_DEPARTMENT_OTHER): Payer: Self-pay | Admitting: Plastic Surgery

## 2023-04-03 ENCOUNTER — Other Ambulatory Visit: Payer: Self-pay

## 2023-04-07 ENCOUNTER — Encounter (HOSPITAL_BASED_OUTPATIENT_CLINIC_OR_DEPARTMENT_OTHER)
Admission: RE | Admit: 2023-04-07 | Discharge: 2023-04-07 | Disposition: A | Discharge status: Home / Self Care | Payer: BC Managed Care – PPO | Source: Ambulatory Visit | Attending: Plastic Surgery | Admitting: Plastic Surgery

## 2023-04-07 ENCOUNTER — Encounter (HOSPITAL_BASED_OUTPATIENT_CLINIC_OR_DEPARTMENT_OTHER): Payer: BC Managed Care – PPO | Attending: Plastic Surgery

## 2023-04-07 ENCOUNTER — Other Ambulatory Visit: Payer: Self-pay

## 2023-04-07 DIAGNOSIS — I10 Essential (primary) hypertension: Secondary | ICD-10-CM | POA: Insufficient documentation

## 2023-04-07 DIAGNOSIS — J4489 Other specified chronic obstructive pulmonary disease: Secondary | ICD-10-CM | POA: Diagnosis not present

## 2023-04-07 DIAGNOSIS — Z8719 Personal history of other diseases of the digestive system: Secondary | ICD-10-CM | POA: Diagnosis not present

## 2023-04-07 DIAGNOSIS — T8542XA Displacement of breast prosthesis and implant, initial encounter: Secondary | ICD-10-CM | POA: Diagnosis not present

## 2023-04-07 DIAGNOSIS — Z8041 Family history of malignant neoplasm of ovary: Secondary | ICD-10-CM | POA: Diagnosis not present

## 2023-04-07 DIAGNOSIS — Z01818 Encounter for other preprocedural examination: Secondary | ICD-10-CM | POA: Insufficient documentation

## 2023-04-07 DIAGNOSIS — Z79899 Other long term (current) drug therapy: Secondary | ICD-10-CM | POA: Insufficient documentation

## 2023-04-07 DIAGNOSIS — Z800 Family history of malignant neoplasm of digestive organs: Secondary | ICD-10-CM | POA: Diagnosis not present

## 2023-04-07 DIAGNOSIS — Z9013 Acquired absence of bilateral breasts and nipples: Secondary | ICD-10-CM | POA: Diagnosis not present

## 2023-04-07 DIAGNOSIS — Y812 Prosthetic and other implants, materials and accessory general- and plastic-surgery devices associated with adverse incidents: Secondary | ICD-10-CM | POA: Diagnosis not present

## 2023-04-07 DIAGNOSIS — Z853 Personal history of malignant neoplasm of breast: Secondary | ICD-10-CM | POA: Diagnosis not present

## 2023-04-07 DIAGNOSIS — Z79891 Long term (current) use of opiate analgesic: Secondary | ICD-10-CM | POA: Diagnosis not present

## 2023-04-07 DIAGNOSIS — M797 Fibromyalgia: Secondary | ICD-10-CM | POA: Diagnosis not present

## 2023-04-07 DIAGNOSIS — G8929 Other chronic pain: Secondary | ICD-10-CM | POA: Diagnosis not present

## 2023-04-07 LAB — BASIC METABOLIC PANEL
Anion gap: 10 (ref 5–15)
BUN: 12 mg/dL (ref 6–20)
CO2: 24 mmol/L (ref 22–32)
Calcium: 9.4 mg/dL (ref 8.9–10.3)
Chloride: 101 mmol/L (ref 98–111)
Creatinine, Ser: 0.86 mg/dL (ref 0.44–1.00)
GFR, Estimated: >60 mL/min (ref >60)
Glucose, Bld: 84 mg/dL (ref 70–99)
Potassium: 4 mmol/L (ref 3.5–5.1)
Sodium: 135 mmol/L (ref 135–145)

## 2023-04-07 MED ORDER — CHLORHEXIDINE GLUCONATE CLOTH 2 % EX PADS: 6.0000 | MEDICATED_PAD | Freq: Once | CUTANEOUS | Status: DC

## 2023-04-07 NOTE — Progress Notes (Signed)
       Patient Instructions  The night before surgery:  No food after midnight. ONLY clear liquids after midnight  The day of surgery (if you do NOT have diabetes):  Drink ONE (1) Pre-Surgery Clear Ensure as directed.   This drink was given to you during your hospital  pre-op appointment visit. The pre-op nurse will instruct you on the time to drink the  Pre-Surgery Ensure depending on your surgery time. Finish the drink at the designated time by the pre-op nurse.  Nothing else to drink after completing the  Pre-Surgery Clear Ensure.  The day of surgery (if you have diabetes): Drink ONE (1) Gatorade 2 (G2) as directed. This drink was given to you during your hospital  pre-op appointment visit.  The pre-op nurse will instruct you on the time to drink the   Gatorade 2 (G2) depending on your surgery time. Color of the Gatorade may vary. Red is not allowed. Nothing else to drink after completing the  Gatorade 2 (G2).         If you have questions, please contact your surgeon's office.  Surgical soap given to patient, instructions given, patient verbalized understanding.  

## 2023-04-09 NOTE — Anesthesia Preprocedure Evaluation (Addendum)
Anesthesia Evaluation  Patient identified by MRN, date of birth, ID band Patient awake    Reviewed: Allergy & Precautions, H&P , NPO status , Patient's Chart, lab work & pertinent test results  History of Anesthesia Complications (+) PONV and history of anesthetic complications  Airway Mallampati: II       Dental no notable dental hx.    Pulmonary shortness of breath, asthma , COPD   Pulmonary exam normal        Cardiovascular hypertension, Pt. on medications Normal cardiovascular exam     Neuro/Psych  PSYCHIATRIC DISORDERS Anxiety Depression       GI/Hepatic Neg liver ROS,,,  Endo/Other  negative endocrine ROS    Renal/GU   negative genitourinary   Musculoskeletal  (+) Arthritis , Osteoarthritis,    Abdominal  (+) + obese  Peds negative pediatric ROS (+)  Hematology negative hematology ROS (+)   Anesthesia Other Findings   Reproductive/Obstetrics negative OB ROS                             Anesthesia Physical Anesthesia Plan  ASA: 2  Anesthesia Plan: General   Post-op Pain Management:    Induction: Intravenous  PONV Risk Score and Plan: 4 or greater and Ondansetron, Dexamethasone, Treatment may vary due to age or medical condition and Midazolam  Airway Management Planned: Oral ETT  Additional Equipment: None  Intra-op Plan:   Post-operative Plan: Extubation in OR  Informed Consent: I have reviewed the patients History and Physical, chart, labs and discussed the procedure including the risks, benefits and alternatives for the proposed anesthesia with the patient or authorized representative who has indicated his/her understanding and acceptance.     Dental advisory given  Plan Discussed with: CRNA  Anesthesia Plan Comments:        Anesthesia Quick Evaluation

## 2023-04-10 ENCOUNTER — Ambulatory Visit (HOSPITAL_BASED_OUTPATIENT_CLINIC_OR_DEPARTMENT_OTHER)
Admission: RE | Admit: 2023-04-10 | Discharge: 2023-04-10 | Disposition: A | Discharge status: Home / Self Care | Payer: BC Managed Care – PPO | Attending: Plastic Surgery | Admitting: Plastic Surgery

## 2023-04-10 ENCOUNTER — Encounter (HOSPITAL_BASED_OUTPATIENT_CLINIC_OR_DEPARTMENT_OTHER)
Admission: RE | Disposition: A | Discharge status: Home / Self Care | Payer: Self-pay | Source: Home / Self Care | Attending: Plastic Surgery

## 2023-04-10 ENCOUNTER — Other Ambulatory Visit: Payer: Self-pay

## 2023-04-10 ENCOUNTER — Ambulatory Visit (HOSPITAL_BASED_OUTPATIENT_CLINIC_OR_DEPARTMENT_OTHER): Payer: BC Managed Care – PPO | Admitting: Anesthesiology

## 2023-04-10 ENCOUNTER — Encounter (HOSPITAL_BASED_OUTPATIENT_CLINIC_OR_DEPARTMENT_OTHER): Payer: Self-pay | Admitting: Plastic Surgery

## 2023-04-10 DIAGNOSIS — M797 Fibromyalgia: Secondary | ICD-10-CM | POA: Insufficient documentation

## 2023-04-10 DIAGNOSIS — G8929 Other chronic pain: Secondary | ICD-10-CM | POA: Insufficient documentation

## 2023-04-10 DIAGNOSIS — I10 Essential (primary) hypertension: Secondary | ICD-10-CM | POA: Insufficient documentation

## 2023-04-10 DIAGNOSIS — J4489 Other specified chronic obstructive pulmonary disease: Secondary | ICD-10-CM | POA: Insufficient documentation

## 2023-04-10 DIAGNOSIS — Z79891 Long term (current) use of opiate analgesic: Secondary | ICD-10-CM | POA: Insufficient documentation

## 2023-04-10 DIAGNOSIS — Z8719 Personal history of other diseases of the digestive system: Secondary | ICD-10-CM | POA: Insufficient documentation

## 2023-04-10 DIAGNOSIS — Z01818 Encounter for other preprocedural examination: Secondary | ICD-10-CM

## 2023-04-10 DIAGNOSIS — Z800 Family history of malignant neoplasm of digestive organs: Secondary | ICD-10-CM | POA: Insufficient documentation

## 2023-04-10 DIAGNOSIS — Z853 Personal history of malignant neoplasm of breast: Secondary | ICD-10-CM | POA: Insufficient documentation

## 2023-04-10 DIAGNOSIS — Z8041 Family history of malignant neoplasm of ovary: Secondary | ICD-10-CM | POA: Insufficient documentation

## 2023-04-10 DIAGNOSIS — Y812 Prosthetic and other implants, materials and accessory general- and plastic-surgery devices associated with adverse incidents: Secondary | ICD-10-CM | POA: Insufficient documentation

## 2023-04-10 DIAGNOSIS — Z79899 Other long term (current) drug therapy: Secondary | ICD-10-CM

## 2023-04-10 DIAGNOSIS — Z9013 Acquired absence of bilateral breasts and nipples: Secondary | ICD-10-CM | POA: Insufficient documentation

## 2023-04-10 DIAGNOSIS — T8542XA Displacement of breast prosthesis and implant, initial encounter: Secondary | ICD-10-CM | POA: Insufficient documentation

## 2023-04-10 HISTORY — PX: BREAST CAPSULECTOMY WITH IMPLANT EXCHANGE: SHX5592

## 2023-04-10 HISTORY — PX: LIPOSUCTION: SHX10

## 2023-04-10 SURGERY — REVISION, RECONSTRUCTION, BREAST
Anesthesia: General | Site: Chest | Laterality: Bilateral

## 2023-04-10 MED ORDER — LIDOCAINE 2% (20 MG/ML) 5 ML SYRINGE
INTRAMUSCULAR | Status: AC
  Filled 2023-04-10: qty 5

## 2023-04-10 MED ORDER — GABAPENTIN 300 MG PO CAPS
300.0000 mg | ORAL_CAPSULE | ORAL | Status: AC
  Administered 2023-04-10: 300 mg via ORAL

## 2023-04-10 MED ORDER — PHENYLEPHRINE HCL (PRESSORS) 10 MG/ML IV SOLN
INTRAVENOUS | Status: AC
  Filled 2023-04-10: qty 1

## 2023-04-10 MED ORDER — LACTATED RINGERS IV SOLN: INTRAVENOUS | Status: DC | PRN

## 2023-04-10 MED ORDER — LIDOCAINE HCL (CARDIAC) PF 100 MG/5ML IV SOSY
PREFILLED_SYRINGE | INTRAVENOUS | Status: DC | PRN
  Administered 2023-04-10: 100 mg via INTRAVENOUS

## 2023-04-10 MED ORDER — DEXAMETHASONE SODIUM PHOSPHATE 10 MG/ML IJ SOLN
INTRAMUSCULAR | Status: AC
  Filled 2023-04-10: qty 1

## 2023-04-10 MED ORDER — FENTANYL CITRATE (PF) 100 MCG/2ML IJ SOLN
INTRAMUSCULAR | Status: AC
  Filled 2023-04-10: qty 2

## 2023-04-10 MED ORDER — ROCURONIUM BROMIDE 10 MG/ML (PF) SYRINGE
PREFILLED_SYRINGE | INTRAVENOUS | Status: AC
  Filled 2023-04-10: qty 10

## 2023-04-10 MED ORDER — LIDOCAINE HCL (PF) 1 % IJ SOLN
INTRAMUSCULAR | Status: AC
  Filled 2023-04-10: qty 60

## 2023-04-10 MED ORDER — FENTANYL CITRATE (PF) 100 MCG/2ML IJ SOLN
25.0000 ug | INTRAMUSCULAR | Status: DC | PRN
  Administered 2023-04-10 (×3): 50 ug via INTRAVENOUS

## 2023-04-10 MED ORDER — ACETAMINOPHEN 325 MG PO TABS: 325.0000 mg | ORAL_TABLET | ORAL | Status: DC | PRN

## 2023-04-10 MED ORDER — MIDAZOLAM HCL 5 MG/5ML IJ SOLN
INTRAMUSCULAR | Status: DC | PRN
  Administered 2023-04-10: 2 mg via INTRAVENOUS

## 2023-04-10 MED ORDER — ACETAMINOPHEN 10 MG/ML IV SOLN: 1000.0000 mg | Freq: Once | INTRAVENOUS | Status: DC | PRN

## 2023-04-10 MED ORDER — OXYCODONE HCL 5 MG PO TABS
ORAL_TABLET | ORAL | Status: AC
  Filled 2023-04-10: qty 1

## 2023-04-10 MED ORDER — ACETAMINOPHEN 160 MG/5ML PO SOLN: 325.0000 mg | ORAL | Status: DC | PRN

## 2023-04-10 MED ORDER — SODIUM CHLORIDE 0.9 % IV SOLN
INTRAVENOUS | Status: DC | PRN
  Administered 2023-04-10: 500 mL

## 2023-04-10 MED ORDER — DEXAMETHASONE SODIUM PHOSPHATE 10 MG/ML IJ SOLN
INTRAMUSCULAR | Status: DC | PRN
  Administered 2023-04-10: 10 mg via INTRAVENOUS

## 2023-04-10 MED ORDER — CEFAZOLIN SODIUM-DEXTROSE 2-4 GM/100ML-% IV SOLN
INTRAVENOUS | Status: AC
  Filled 2023-04-10: qty 100

## 2023-04-10 MED ORDER — POVIDONE-IODINE 10 % EX SOLN
CUTANEOUS | Status: DC | PRN
  Administered 2023-04-10: 1 via TOPICAL

## 2023-04-10 MED ORDER — MIDAZOLAM HCL 2 MG/2ML IJ SOLN
INTRAMUSCULAR | Status: AC
  Filled 2023-04-10: qty 2

## 2023-04-10 MED ORDER — CELECOXIB 200 MG PO CAPS
ORAL_CAPSULE | ORAL | Status: AC
  Filled 2023-04-10: qty 1

## 2023-04-10 MED ORDER — ONDANSETRON HCL 4 MG/2ML IJ SOLN
INTRAMUSCULAR | Status: AC
  Filled 2023-04-10: qty 2

## 2023-04-10 MED ORDER — PHENYLEPHRINE HCL-NACL 20-0.9 MG/250ML-% IV SOLN
INTRAVENOUS | Status: DC | PRN
  Administered 2023-04-10: 100 ug/min via INTRAVENOUS

## 2023-04-10 MED ORDER — OXYCODONE HCL 5 MG PO TABS
5.0000 mg | ORAL_TABLET | Freq: Once | ORAL | Status: AC | PRN
  Administered 2023-04-10: 5 mg via ORAL

## 2023-04-10 MED ORDER — EPHEDRINE SULFATE (PRESSORS) 50 MG/ML IJ SOLN
INTRAMUSCULAR | Status: DC | PRN
  Administered 2023-04-10 (×2): 10 mg via INTRAVENOUS
  Administered 2023-04-10: 15 mg via INTRAVENOUS

## 2023-04-10 MED ORDER — PROPOFOL 10 MG/ML IV BOLUS
INTRAVENOUS | Status: DC | PRN
Start: 2023-04-10 — End: 2023-04-10
  Administered 2023-04-10: 200 mg via INTRAVENOUS

## 2023-04-10 MED ORDER — SODIUM BICARBONATE 4.2 % IV SOLN
INTRAVENOUS | Status: DC | PRN
  Administered 2023-04-10: 200 mL

## 2023-04-10 MED ORDER — ONDANSETRON HCL 4 MG/2ML IJ SOLN: 4.0000 mg | Freq: Once | INTRAMUSCULAR | Status: DC | PRN

## 2023-04-10 MED ORDER — FENTANYL CITRATE (PF) 100 MCG/2ML IJ SOLN
INTRAMUSCULAR | Status: DC | PRN
  Administered 2023-04-10 (×2): 50 ug via INTRAVENOUS

## 2023-04-10 MED ORDER — GABAPENTIN 300 MG PO CAPS
ORAL_CAPSULE | ORAL | Status: AC
  Filled 2023-04-10: qty 1

## 2023-04-10 MED ORDER — CELECOXIB 200 MG PO CAPS
200.0000 mg | ORAL_CAPSULE | ORAL | Status: AC
  Administered 2023-04-10: 200 mg via ORAL

## 2023-04-10 MED ORDER — BUPIVACAINE HCL (PF) 0.5 % IJ SOLN
INTRAMUSCULAR | Status: AC
  Filled 2023-04-10: qty 30

## 2023-04-10 MED ORDER — EPHEDRINE 5 MG/ML INJ
INTRAVENOUS | Status: AC
  Filled 2023-04-10: qty 5

## 2023-04-10 MED ORDER — ACETAMINOPHEN 500 MG PO TABS
ORAL_TABLET | ORAL | Status: AC
  Filled 2023-04-10: qty 2

## 2023-04-10 MED ORDER — ONDANSETRON HCL 4 MG/2ML IJ SOLN
INTRAMUSCULAR | Status: DC | PRN
Start: 2023-04-10 — End: 2023-04-10
  Administered 2023-04-10: 4 mg via INTRAVENOUS

## 2023-04-10 MED ORDER — CEFAZOLIN SODIUM-DEXTROSE 2-4 GM/100ML-% IV SOLN
2.0000 g | INTRAVENOUS | Status: AC
  Administered 2023-04-10: 2 g via INTRAVENOUS

## 2023-04-10 MED ORDER — OXYCODONE HCL 5 MG/5ML PO SOLN: 5.0000 mg | Freq: Once | ORAL | Status: AC | PRN

## 2023-04-10 MED ORDER — SODIUM BICARBONATE 4.2 % IV SOLN
INTRAVENOUS | Status: AC
  Filled 2023-04-10: qty 20

## 2023-04-10 MED ORDER — VASOPRESSIN 20 UNIT/ML IV SOLN
INTRAVENOUS | Status: DC | PRN
  Administered 2023-04-10 (×2): 2 [IU] via INTRAVENOUS
  Administered 2023-04-10 (×3): 2.5 [IU] via INTRAVENOUS

## 2023-04-10 MED ORDER — LACTATED RINGERS IV SOLN: INTRAVENOUS | Status: DC

## 2023-04-10 MED ORDER — VASOPRESSIN 20 UNIT/ML IV SOLN
INTRAVENOUS | Status: AC
  Filled 2023-04-10: qty 1

## 2023-04-10 MED ORDER — PHENYLEPHRINE 80 MCG/ML (10ML) SYRINGE FOR IV PUSH (FOR BLOOD PRESSURE SUPPORT)
PREFILLED_SYRINGE | INTRAVENOUS | Status: AC
  Filled 2023-04-10: qty 10

## 2023-04-10 MED ORDER — PHENYLEPHRINE HCL (PRESSORS) 10 MG/ML IV SOLN
INTRAVENOUS | Status: DC | PRN
  Administered 2023-04-10 (×2): 160 ug via INTRAVENOUS
  Administered 2023-04-10: 80 ug via INTRAVENOUS
  Administered 2023-04-10 (×2): 160 ug via INTRAVENOUS

## 2023-04-10 MED ORDER — EPINEPHRINE PF 1 MG/ML IJ SOLN
INTRAMUSCULAR | Status: AC
  Filled 2023-04-10: qty 1

## 2023-04-10 MED ORDER — ROCURONIUM BROMIDE 100 MG/10ML IV SOLN
INTRAVENOUS | Status: DC | PRN
  Administered 2023-04-10: 20 mg via INTRAVENOUS
  Administered 2023-04-10: 10 mg via INTRAVENOUS
  Administered 2023-04-10: 50 mg via INTRAVENOUS

## 2023-04-10 MED ORDER — SODIUM CHLORIDE (PF) 0.9 % IJ SOLN
INTRAMUSCULAR | Status: AC
  Filled 2023-04-10: qty 20

## 2023-04-10 MED ORDER — MEPERIDINE HCL 25 MG/ML IJ SOLN: 6.2500 mg | INTRAMUSCULAR | Status: DC | PRN

## 2023-04-10 MED ORDER — ACETAMINOPHEN 500 MG PO TABS
1000.0000 mg | ORAL_TABLET | ORAL | Status: AC
  Administered 2023-04-10: 1000 mg via ORAL

## 2023-04-10 MED ORDER — PROPOFOL 10 MG/ML IV BOLUS
INTRAVENOUS | Status: AC
  Filled 2023-04-10: qty 20

## 2023-04-10 MED ORDER — SODIUM CHLORIDE 0.9 % IV SOLN
INTRAVENOUS | Status: AC
  Filled 2023-04-10: qty 10

## 2023-04-10 MED ORDER — SUGAMMADEX SODIUM 200 MG/2ML IV SOLN
INTRAVENOUS | Status: DC | PRN
  Administered 2023-04-10: 200 mg via INTRAVENOUS

## 2023-04-10 SURGICAL SUPPLY — 99 items
ADH SKN CLS APL DERMABOND .7 (GAUZE/BANDAGES/DRESSINGS)
APL PRP STRL LF DISP 70% ISPRP (MISCELLANEOUS) ×4
BAG DECANTER FOR FLEXI CONT (MISCELLANEOUS) ×2 IMPLANT
BINDER ABDOMINAL 10 UNV 27-48 (MISCELLANEOUS) IMPLANT
BINDER ABDOMINAL 12 SM 30-45 (SOFTGOODS) IMPLANT
BINDER BREAST LRG (GAUZE/BANDAGES/DRESSINGS) IMPLANT
BINDER BREAST MEDIUM (GAUZE/BANDAGES/DRESSINGS) IMPLANT
BINDER BREAST XLRG (GAUZE/BANDAGES/DRESSINGS) IMPLANT
BINDER BREAST XXLRG (GAUZE/BANDAGES/DRESSINGS) IMPLANT
BLADE HEX COATED 2.75 (ELECTRODE) IMPLANT
BLADE SURG 10 STRL SS (BLADE) ×4 IMPLANT
BLADE SURG 11 STRL SS (BLADE) ×2 IMPLANT
BLADE SURG 15 STRL LF DISP TIS (BLADE) ×2 IMPLANT
BLADE SURG 15 STRL SS (BLADE) ×2
BNDG CMPR 6 X 5 YARDS HK CLSR (GAUZE/BANDAGES/DRESSINGS)
BNDG ELASTIC 6INX 5YD STR LF (GAUZE/BANDAGES/DRESSINGS) IMPLANT
BNDG GAUZE DERMACEA FLUFF 4 (GAUZE/BANDAGES/DRESSINGS) ×4 IMPLANT
BNDG GZE DERMACEA 4 6PLY (GAUZE/BANDAGES/DRESSINGS)
CANISTER SUCT 1200ML W/VALVE (MISCELLANEOUS) ×2 IMPLANT
CHLORAPREP W/TINT 26 (MISCELLANEOUS) ×4 IMPLANT
COVER BACK TABLE 60X90IN (DRAPES) ×2 IMPLANT
COVER MAYO STAND STRL (DRAPES) ×2 IMPLANT
DERMABOND ADVANCED .7 DNX12 (GAUZE/BANDAGES/DRESSINGS) ×4 IMPLANT
DRAIN CHANNEL 15F RND FF W/TCR (WOUND CARE) ×2 IMPLANT
DRAIN CHANNEL 19F RND (DRAIN) IMPLANT
DRAPE INCISE IOBAN 66X45 STRL (DRAPES) IMPLANT
DRAPE LAPAROSCOPIC ABDOMINAL (DRAPES) IMPLANT
DRAPE TOP ARMCOVERS (MISCELLANEOUS) ×2 IMPLANT
DRAPE U-SHAPE 76X120 STRL (DRAPES) ×2 IMPLANT
DRAPE UTILITY XL STRL (DRAPES) ×2 IMPLANT
DRSG TEGADERM 2-3/8X2-3/4 SM (GAUZE/BANDAGES/DRESSINGS) IMPLANT
DRSG TEGADERM 4X10 (GAUZE/BANDAGES/DRESSINGS) IMPLANT
ELECT BLADE 4.0 EZ CLEAN MEGAD (MISCELLANEOUS)
ELECT COATED BLADE 2.86 ST (ELECTRODE) ×2 IMPLANT
ELECT REM PT RETURN 9FT ADLT (ELECTROSURGICAL) ×2
ELECTRODE BLDE 4.0 EZ CLN MEGD (MISCELLANEOUS) ×2 IMPLANT
ELECTRODE REM PT RTRN 9FT ADLT (ELECTROSURGICAL) ×2 IMPLANT
EVACUATOR SILICONE 100CC (DRAIN) ×2 IMPLANT
FUNNEL KELLER 2 DISP (MISCELLANEOUS) IMPLANT
GAUZE PAD ABD 8X10 STRL (GAUZE/BANDAGES/DRESSINGS) ×4 IMPLANT
GLOVE BIOGEL PI IND STRL 6.5 (GLOVE) IMPLANT
GLOVE SURG SS PI 6.0 STRL IVOR (GLOVE) ×4 IMPLANT
GOWN STRL REUS W/ TWL LRG LVL3 (GOWN DISPOSABLE) ×4 IMPLANT
GOWN STRL REUS W/TWL LRG LVL3 (GOWN DISPOSABLE) ×4
IMPL BREAST 700CC (Breast) IMPLANT
IMPLANT BREAST 700CC (Breast) ×4 IMPLANT
IV NS 500ML (IV SOLUTION)
IV NS 500ML BAXH (IV SOLUTION) ×2 IMPLANT
KIT FILL ASEPTIC TRANSFER (MISCELLANEOUS) IMPLANT
LINER CANISTER 1000CC FLEX (MISCELLANEOUS) ×2 IMPLANT
MARKER SKIN DUAL TIP RULER LAB (MISCELLANEOUS) IMPLANT
NDL FILTER BLUNT 18X1 1/2 (NEEDLE) IMPLANT
NDL HYPO 25X1 1.5 SAFETY (NEEDLE) IMPLANT
NDL SAFETY ECLIP 18X1.5 (MISCELLANEOUS) ×2 IMPLANT
NDL SPNL 18GX3.5 QUINCKE PK (NEEDLE) IMPLANT
NEEDLE FILTER BLUNT 18X1 1/2 (NEEDLE) ×2
NEEDLE HYPO 25X1 1.5 SAFETY (NEEDLE)
NEEDLE SPNL 18GX3.5 QUINCKE PK (NEEDLE)
NS IRRIG 1000ML POUR BTL (IV SOLUTION) IMPLANT
PACK BASIN DAY SURGERY FS (CUSTOM PROCEDURE TRAY) ×2 IMPLANT
PAD ALCOHOL SWAB (MISCELLANEOUS) ×4 IMPLANT
PENCIL SMOKE EVACUATOR (MISCELLANEOUS) ×2 IMPLANT
PIN SAFETY STERILE (MISCELLANEOUS) ×2 IMPLANT
PUNCH BIOPSY 4MM DISP (MISCELLANEOUS) IMPLANT
SHEET MEDIUM DRAPE 40X70 STRL (DRAPES) ×2 IMPLANT
SLEEVE SCD COMPRESS KNEE MED (STOCKING) ×2 IMPLANT
SPIKE FLUID TRANSFER (MISCELLANEOUS) IMPLANT
SPONGE T-LAP 18X18 ~~LOC~~+RFID (SPONGE) ×4 IMPLANT
STAPLER VISISTAT 35W (STAPLE) ×2 IMPLANT
STRIP CLOSURE SKIN 1/2X4 (GAUZE/BANDAGES/DRESSINGS) IMPLANT
SUT CHROMIC 4 0 PS 2 18 (SUTURE) IMPLANT
SUT ETHIBOND 0 MO6 C/R (SUTURE) IMPLANT
SUT ETHILON 2 0 FS 18 (SUTURE) ×2 IMPLANT
SUT MNCRL AB 4-0 PS2 18 (SUTURE) ×2 IMPLANT
SUT PDS 3-0 CT2 (SUTURE)
SUT PDS AB 2-0 CT2 27 (SUTURE) IMPLANT
SUT PDS II 3-0 CT2 27 ABS (SUTURE) IMPLANT
SUT SILK 2 0 SH (SUTURE) IMPLANT
SUT VIC AB 3-0 PS1 18 (SUTURE)
SUT VIC AB 3-0 PS1 18XBRD (SUTURE) IMPLANT
SUT VIC AB 3-0 SH 27 (SUTURE) ×2
SUT VIC AB 3-0 SH 27X BRD (SUTURE) ×2 IMPLANT
SUT VIC AB 4-0 PS2 18 (SUTURE) IMPLANT
SUT VICRYL 0 CT-2 (SUTURE) IMPLANT
SUT VLOC 180 0 24IN GS25 (SUTURE) IMPLANT
SYR 10ML LL (SYRINGE) ×2 IMPLANT
SYR 20ML LL LF (SYRINGE) IMPLANT
SYR 50ML LL SCALE MARK (SYRINGE) ×2 IMPLANT
SYR BULB IRRIG 60ML STRL (SYRINGE) ×2 IMPLANT
SYR CONTROL 10ML LL (SYRINGE) IMPLANT
SYR TB 1ML LL NO SAFETY (SYRINGE) ×2 IMPLANT
TAPE MEASURE VINYL STERILE (MISCELLANEOUS) IMPLANT
TOWEL GREEN STERILE FF (TOWEL DISPOSABLE) ×4 IMPLANT
TRAY DSU PREP LF (CUSTOM PROCEDURE TRAY) IMPLANT
TUBE CONNECTING 20X1/4 (TUBING) ×2 IMPLANT
TUBING INFILTRATION IT-10001 (TUBING) ×2 IMPLANT
TUBING SET GRADUATE ASPIR 12FT (MISCELLANEOUS) ×2 IMPLANT
UNDERPAD 30X36 HEAVY ABSORB (UNDERPADS AND DIAPERS) ×4 IMPLANT
YANKAUER SUCT BULB TIP NO VENT (SUCTIONS) ×2 IMPLANT

## 2023-04-10 NOTE — Anesthesia Procedure Notes (Signed)
Procedure Name: Intubation Date/Time: 04/10/2023 7:45 AM  Performed by: Thornell Mule, CRNAPre-anesthesia Checklist: Patient identified, Emergency Drugs available, Suction available and Patient being monitored Patient Re-evaluated:Patient Re-evaluated prior to induction Oxygen Delivery Method: Circle system utilized Preoxygenation: Pre-oxygenation with 100% oxygen Induction Type: IV induction Ventilation: Mask ventilation without difficulty Laryngoscope Size: Glidescope and 3 Grade View: Grade I Tube type: Oral Tube size: 7.0 mm Number of attempts: 1 Airway Equipment and Method: Stylet, Oral airway and Video-laryngoscopy Placement Confirmation: ETT inserted through vocal cords under direct vision, positive ETCO2 and breath sounds checked- equal and bilateral Secured at: 20 cm Tube secured with: Tape Dental Injury: Teeth and Oropharynx as per pre-operative assessment  Difficulty Due To: Difficulty was anticipated, Difficult Airway- due to reduced neck mobility and Difficult Airway- due to limited oral opening Future Recommendations: Recommend- induction with short-acting agent, and alternative techniques readily available

## 2023-04-10 NOTE — Anesthesia Postprocedure Evaluation (Signed)
Anesthesia Post Note  Patient: Melissa Allen  Procedure(s) Performed: REVISION OF BREAST RECONSTRUCTION (Bilateral: Chest) BREAST CAPSULORRHAPHY WITH SILICONE IMPLANT EXCHANGE (Bilateral: Chest) BILATERAL CHEST WALLS LIPOSUCTION (Bilateral)     Patient location during evaluation: Phase II Anesthesia Type: General Level of consciousness: awake and sedated Pain management: pain level controlled Vital Signs Assessment: post-procedure vital signs reviewed and stable Respiratory status: spontaneous breathing Cardiovascular status: stable Postop Assessment: no apparent nausea or vomiting Anesthetic complications: yes  Encounter Notable Events  Notable Event Outcome Phase Comment  Difficult to intubate - expected  Intraprocedure Filed from anesthesia note documentation.    Last Vitals:  Vitals:   04/10/23 1130 04/10/23 1145  BP: 116/64 (!) 102/52  Pulse: (!) 106 (!) 104  Resp: 17 15  Temp:    SpO2: 99% 95%    Last Pain:  Vitals:   04/10/23 1215  TempSrc:   PainSc: 8                  John F Scharlene Corn

## 2023-04-10 NOTE — Discharge Instructions (Signed)
  Post Anesthesia Home Care Instructions  Activity: Get plenty of rest for the remainder of the day. A responsible individual must stay with you for 24 hours following the procedure.  For the next 24 hours, DO NOT: -Drive a car -Advertising copywriter -Drink alcoholic beverages -Take any medication unless instructed by your physician -Make any legal decisions or sign important papers.  Meals: Start with liquid foods such as gelatin or soup. Progress to regular foods as tolerated. Avoid greasy, spicy, heavy foods. If nausea and/or vomiting occur, drink only clear liquids until the nausea and/or vomiting subsides. Call your physician if vomiting continues.  Special Instructions/Symptoms: Your throat may feel dry or sore from the anesthesia or the breathing tube placed in your throat during surgery. If this causes discomfort, gargle with warm salt water. The discomfort should disappear within 24 hours.  If you had a scopolamine patch placed behind your ear for the management of post- operative nausea and/or vomiting:  1. The medication in the patch is effective for 72 hours, after which it should be removed.  Wrap patch in a tissue and discard in the trash. Wash hands thoroughly with soap and water. 2. You may remove the patch earlier than 72 hours if you experience unpleasant side effects which may include dry mouth, dizziness or visual disturbances. 3. Avoid touching the patch. Wash your hands with soap and water after contact with the patch.    Post Anesthesia Home Care Instructions  Activity: Get plenty of rest for the remainder of the day. A responsible individual must stay with you for 24 hours following the procedure.  For the next 24 hours, DO NOT: -Drive a car -Advertising copywriter -Drink alcoholic beverages -Take any medication unless instructed by your physician -Make any legal decisions or sign important papers.  Meals: Start with liquid foods such as gelatin or soup. Progress to  regular foods as tolerated. Avoid greasy, spicy, heavy foods. If nausea and/or vomiting occur, drink only clear liquids until the nausea and/or vomiting subsides. Call your physician if vomiting continues.  Special Instructions/Symptoms: Your throat may feel dry or sore from the anesthesia or the breathing tube placed in your throat during surgery. If this causes discomfort, gargle with warm salt water. The discomfort should disappear within 24 hours.  May take Tylenol after 3pm if needed.

## 2023-04-10 NOTE — Transfer of Care (Signed)
Immediate Anesthesia Transfer of Care Note  Patient: Melissa Allen  Procedure(s) Performed: REVISION OF BREAST RECONSTRUCTION (Bilateral: Chest) BREAST CAPSULORRHAPHY WITH SILICONE IMPLANT EXCHANGE (Bilateral: Chest) BILATERAL CHEST WALLS LIPOSUCTION (Bilateral)  Patient Location: PACU  Anesthesia Type:General  Level of Consciousness: drowsy, patient cooperative, and responds to stimulation  Airway & Oxygen Therapy: Patient Spontanous Breathing and Patient connected to face mask oxygen  Post-op Assessment: Report given to RN and Post -op Vital signs reviewed and stable  Post vital signs: Reviewed and stable  Last Vitals:  Vitals Value Taken Time  BP 94/42 04/10/23 1046  Temp    Pulse 106 04/10/23 1047  Resp 0 04/10/23 1047  SpO2 99 % 04/10/23 1047  Vitals shown include unfiled device data.  Last Pain:  Vitals:   04/10/23 0651  TempSrc: Temporal  PainSc: 7       Patients Stated Pain Goal: 5 (04/10/23 6045)  Complications:  Encounter Notable Events  Notable Event Outcome Phase Comment  Difficult to intubate - expected  Intraprocedure Filed from anesthesia note documentation.

## 2023-04-10 NOTE — Op Note (Signed)
Operative Note   DATE OF OPERATION: 8.2.2024  LOCATION: [facility]-outpatient  SURGICAL DIVISION: Plastic Surgery  PREOPERATIVE DIAGNOSES:  1. Acquired absence breasts 2. History DCIS  POSTOPERATIVE DIAGNOSES:  same  PROCEDURE:  1. Revision bilateral breast reconstruction with silicone implant exchange 2. Extensive suture capsulorrpahy bilateral 3. Liposuction bilateral lateral chest wall  SURGEON: Glenna Fellows MD MBA  ASSISTANT: none  ANESTHESIA:  General.   EBL: 50 ml  COMPLICATIONS: None immediate.   INDICATIONS FOR PROCEDURE:  The patient, Melissa Allen, is a 57 y.o. female born on 1965-11-01, is here for revision bilateral breast reconstruction for recurrent lateral displacement.   FINDINGS: Removed intact smooth round silicone implants. Previously placed Strattice noted to be pulled away from lateral chest wall inset bilateral and excised near completely bilateral. Natrelle Smooth Round Soft Touch Extra Projection 700 ml implants placed bilateral. REF SSX-700 RIGHT SN [id number] LEFT [id number]. Total 300 ml lipoaspirate  DESCRIPTION OF PROCEDURE:  The patient's operative site was marked with the patient in the preoperative area including bilateral lateral chest wall for liposuction. The patient was taken to the operating room. SCD was placed and IV antibiotics were given. The patient's operative site was prepped and draped in a sterile fashion. A time out was performed and all information was confirmed to be correct. Incision made in left inframammary fold scar and carried to implant capsule. Implant removed. As above, non adherent Strattice excised. Over right chest, incision made in inframammary fold scar and carried to implant capsule. Implant removed. Non adherent Strattice excised. Stab incision made in bilateral chest in area prior drain exit scar and tumescent infiltrated over bilateral lateral chest wall, total 300 ml. Suction assisted liposuction completed to  endpoint symmetric soft tissue thickness.  Over left lateral chest capsule, interrupted figure of eight 0 ethibond sutures placed from chest wall to mastectomy flap at desired anterior axillary line. Prior implant placed in cavity to check placement. Cavity irrigated with saline solution containing Ancef, gentamicin and Betadine. A new implant placed in cavity with aid of Keller funnel. Orientation implant ensured. Incision closed with 3-0 vicryl in superficial fascia and capsule, 4-0 vicryl in dermis, 4-0 monocryl subcuticular skin closure.   I then directed my attention to right chest. Over lateral chest capsule, interrupted figure of eight 0 ethibond sutures placed from chest wall to mastectomy flap at desired anterior axillary line. Prior implant placed in cavity to check placement. Cavity irrigated with saline solution containing Ancef, gentamicin and Betadine. A new implant placed in cavity with aid of Keller funnel. Orientation implant ensured. Incision closed with 3-0 vicryl in superficial fascia and capsule, 4-0 vicryl in dermis, 4-0 monocryl subcuticular skin closure. Steri strips applied to fold incisions. Simple interrupted 4-0 monocryl placed in liposuction port sites. Dry dressing and breast binder applied.   The patient was allowed to wake from anesthesia, extubated and taken to the recovery room in satisfactory condition.   SPECIMENS: none  DRAINS: none  Glenna Fellows, MD [facility] Plastic & Reconstructive Surgery  Office/ physician access line after hours [phone]

## 2023-04-10 NOTE — Interval H&P Note (Signed)
History and Physical Interval Note:  04/10/2023 6:55 AM  Melissa Allen  has presented today for surgery, with the diagnosis of history DCIS acquired absence breasts.  The various methods of treatment have been discussed with the patient and family. After consideration of risks, benefits and other options for treatment, the patient has consented to  revision bilateral breast reconstruction with silicone implant exchange, capsulorraphy,possible acellular dermis, liposuction bilateral lateral chest wall as a surgical intervention.  The patient's history has been reviewed, patient examined, no change in status, stable for surgery.  I have reviewed the patient's chart and labs.  Questions were answered to the patient's satisfaction.     Irean Hong 

## 2023-04-13 ENCOUNTER — Encounter (HOSPITAL_BASED_OUTPATIENT_CLINIC_OR_DEPARTMENT_OTHER): Payer: Self-pay | Admitting: Plastic Surgery

## 2023-06-02 ENCOUNTER — Inpatient Hospital Stay (HOSPITAL_COMMUNITY)
Admission: EM | Admit: 2023-06-02 | Discharge: 2023-06-04 | DRG: 683 | Disposition: A | Discharge status: Home / Self Care | Payer: BC Managed Care – PPO | Attending: Internal Medicine | Admitting: Internal Medicine

## 2023-06-02 ENCOUNTER — Other Ambulatory Visit: Payer: Self-pay

## 2023-06-02 ENCOUNTER — Emergency Department (HOSPITAL_COMMUNITY): Payer: BC Managed Care – PPO

## 2023-06-02 DIAGNOSIS — E874 Mixed disorder of acid-base balance: Secondary | ICD-10-CM | POA: Diagnosis present

## 2023-06-02 DIAGNOSIS — Z6833 Body mass index (BMI) 33.0-33.9, adult: Secondary | ICD-10-CM

## 2023-06-02 DIAGNOSIS — Z8249 Family history of ischemic heart disease and other diseases of the circulatory system: Secondary | ICD-10-CM

## 2023-06-02 DIAGNOSIS — Z90710 Acquired absence of both cervix and uterus: Secondary | ICD-10-CM

## 2023-06-02 DIAGNOSIS — E876 Hypokalemia: Secondary | ICD-10-CM

## 2023-06-02 DIAGNOSIS — Z885 Allergy status to narcotic agent status: Secondary | ICD-10-CM

## 2023-06-02 DIAGNOSIS — Z96652 Presence of left artificial knee joint: Secondary | ICD-10-CM | POA: Diagnosis present

## 2023-06-02 DIAGNOSIS — I959 Hypotension, unspecified: Secondary | ICD-10-CM | POA: Diagnosis present

## 2023-06-02 DIAGNOSIS — Z9049 Acquired absence of other specified parts of digestive tract: Secondary | ICD-10-CM

## 2023-06-02 DIAGNOSIS — Z79899 Other long term (current) drug therapy: Secondary | ICD-10-CM

## 2023-06-02 DIAGNOSIS — M797 Fibromyalgia: Secondary | ICD-10-CM | POA: Diagnosis present

## 2023-06-02 DIAGNOSIS — R197 Diarrhea, unspecified: Secondary | ICD-10-CM | POA: Diagnosis present

## 2023-06-02 DIAGNOSIS — Z853 Personal history of malignant neoplasm of breast: Secondary | ICD-10-CM

## 2023-06-02 DIAGNOSIS — J4520 Mild intermittent asthma, uncomplicated: Secondary | ICD-10-CM

## 2023-06-02 DIAGNOSIS — Z803 Family history of malignant neoplasm of breast: Secondary | ICD-10-CM

## 2023-06-02 DIAGNOSIS — E860 Dehydration: Secondary | ICD-10-CM

## 2023-06-02 DIAGNOSIS — F411 Generalized anxiety disorder: Secondary | ICD-10-CM

## 2023-06-02 DIAGNOSIS — Z9882 Breast implant status: Secondary | ICD-10-CM

## 2023-06-02 DIAGNOSIS — I10 Essential (primary) hypertension: Secondary | ICD-10-CM | POA: Diagnosis not present

## 2023-06-02 DIAGNOSIS — N179 Acute kidney failure, unspecified: Secondary | ICD-10-CM | POA: Diagnosis not present

## 2023-06-02 DIAGNOSIS — Z91048 Other nonmedicinal substance allergy status: Secondary | ICD-10-CM

## 2023-06-02 DIAGNOSIS — Z91040 Latex allergy status: Secondary | ICD-10-CM

## 2023-06-02 DIAGNOSIS — Z96611 Presence of right artificial shoulder joint: Secondary | ICD-10-CM | POA: Diagnosis present

## 2023-06-02 DIAGNOSIS — E669 Obesity, unspecified: Secondary | ICD-10-CM | POA: Diagnosis present

## 2023-06-02 DIAGNOSIS — E8729 Other acidosis: Secondary | ICD-10-CM

## 2023-06-02 DIAGNOSIS — R55 Syncope and collapse: Secondary | ICD-10-CM

## 2023-06-02 DIAGNOSIS — Z7984 Long term (current) use of oral hypoglycemic drugs: Secondary | ICD-10-CM

## 2023-06-02 DIAGNOSIS — Z83719 Family history of colon polyps, unspecified: Secondary | ICD-10-CM

## 2023-06-02 DIAGNOSIS — Z981 Arthrodesis status: Secondary | ICD-10-CM

## 2023-06-02 DIAGNOSIS — Z888 Allergy status to other drugs, medicaments and biological substances status: Secondary | ICD-10-CM

## 2023-06-02 DIAGNOSIS — Z800 Family history of malignant neoplasm of digestive organs: Secondary | ICD-10-CM

## 2023-06-02 DIAGNOSIS — Z9013 Acquired absence of bilateral breasts and nipples: Secondary | ICD-10-CM

## 2023-06-02 DIAGNOSIS — Z87442 Personal history of urinary calculi: Secondary | ICD-10-CM

## 2023-06-02 LAB — CBC WITH DIFFERENTIAL/PLATELET
Abs Immature Granulocytes: 0.04 10*3/uL (ref 0.00–0.07)
Basophils Absolute: 0.1 10*3/uL (ref 0.0–0.1)
Basophils Relative: 1 %
Eosinophils Absolute: 0.1 10*3/uL (ref 0.0–0.5)
Eosinophils Relative: 1 %
HCT: 44.3 % (ref 36.0–46.0)
Hemoglobin: 14.7 g/dL (ref 12.0–15.0)
Immature Granulocytes: 0 %
Lymphocytes Relative: 22 %
Lymphs Abs: 2.2 10*3/uL (ref 0.7–4.0)
MCH: 29.6 pg (ref 26.0–34.0)
MCHC: 33.2 g/dL (ref 30.0–36.0)
MCV: 89.1 fL (ref 80.0–100.0)
Monocytes Absolute: 0.8 10*3/uL (ref 0.1–1.0)
Monocytes Relative: 8 %
Neutro Abs: 6.6 10*3/uL (ref 1.7–7.7)
Neutrophils Relative %: 68 %
Platelets: 276 10*3/uL (ref 150–400)
RBC: 4.97 MIL/uL (ref 3.87–5.11)
RDW: 13.4 % (ref 11.5–15.5)
WBC: 9.7 10*3/uL (ref 4.0–10.5)
nRBC: 0 % (ref 0.0–0.2)

## 2023-06-02 LAB — BLOOD GAS, VENOUS
Acid-base deficit: 0.8 mmol/L (ref 0.0–2.0)
Bicarbonate: 21.1 mmol/L (ref 20.0–28.0)
O2 Saturation: 76.2 %
Patient temperature: 37
pCO2, Ven: 27 mmHg — ABNORMAL LOW (ref 44–60)
pH, Ven: 7.5 — ABNORMAL HIGH (ref 7.25–7.43)
pO2, Ven: 43 mmHg (ref 32–45)

## 2023-06-02 LAB — COMPREHENSIVE METABOLIC PANEL
ALT: 25 U/L (ref 0–44)
AST: 26 U/L (ref 15–41)
Albumin: 4.6 g/dL (ref 3.5–5.0)
Alkaline Phosphatase: 81 U/L (ref 38–126)
Anion gap: 21 — ABNORMAL HIGH (ref 5–15)
BUN: 44 mg/dL — ABNORMAL HIGH (ref 6–20)
CO2: 19 mmol/L — ABNORMAL LOW (ref 22–32)
Calcium: 9.5 mg/dL (ref 8.9–10.3)
Chloride: 97 mmol/L — ABNORMAL LOW (ref 98–111)
Creatinine, Ser: 2.94 mg/dL — ABNORMAL HIGH (ref 0.44–1.00)
GFR, Estimated: 18 mL/min — ABNORMAL LOW (ref >60)
Glucose, Bld: 82 mg/dL (ref 70–99)
Potassium: 3 mmol/L — ABNORMAL LOW (ref 3.5–5.1)
Sodium: 137 mmol/L (ref 135–145)
Total Bilirubin: 1.6 mg/dL — ABNORMAL HIGH (ref 0.3–1.2)
Total Protein: 8.2 g/dL — ABNORMAL HIGH (ref 6.5–8.1)

## 2023-06-02 LAB — URINALYSIS, ROUTINE W REFLEX MICROSCOPIC
Bilirubin Urine: NEGATIVE
Glucose, UA: NEGATIVE mg/dL
Hgb urine dipstick: NEGATIVE
Ketones, ur: 20 mg/dL — AB
Leukocytes,Ua: NEGATIVE
Nitrite: NEGATIVE
Protein, ur: NEGATIVE mg/dL
Specific Gravity, Urine: 1.013 (ref 1.005–1.030)
pH: 5 (ref 5.0–8.0)

## 2023-06-02 LAB — LIPASE, BLOOD: Lipase: 45 U/L (ref 11–51)

## 2023-06-02 LAB — I-STAT CG4 LACTIC ACID, ED: Lactic Acid, Venous: 1.4 mmol/L (ref 0.5–1.9)

## 2023-06-02 LAB — MAGNESIUM: Magnesium: 1.9 mg/dL (ref 1.7–2.4)

## 2023-06-02 MED ORDER — MELATONIN 3 MG PO TABS: 3.0000 mg | ORAL_TABLET | Freq: Every evening | ORAL | Status: DC | PRN

## 2023-06-02 MED ORDER — POTASSIUM CHLORIDE CRYS ER 20 MEQ PO TBCR
40.0000 meq | EXTENDED_RELEASE_TABLET | Freq: Once | ORAL | Status: AC
  Administered 2023-06-02: 40 meq via ORAL
  Filled 2023-06-02: qty 2

## 2023-06-02 MED ORDER — ACETAMINOPHEN 325 MG PO TABS
650.0000 mg | ORAL_TABLET | Freq: Four times a day (QID) | ORAL | Status: DC | PRN
  Administered 2023-06-03: 650 mg via ORAL
  Filled 2023-06-02: qty 2

## 2023-06-02 MED ORDER — LACTATED RINGERS IV SOLN: INTRAVENOUS | Status: DC

## 2023-06-02 MED ORDER — LACTATED RINGERS IV BOLUS
1000.0000 mL | Freq: Once | INTRAVENOUS | Status: AC
  Administered 2023-06-02: 1000 mL via INTRAVENOUS

## 2023-06-02 MED ORDER — ONDANSETRON HCL 4 MG/2ML IJ SOLN: 4.0000 mg | Freq: Four times a day (QID) | INTRAMUSCULAR | Status: DC | PRN

## 2023-06-02 MED ORDER — ACETAMINOPHEN 650 MG RE SUPP: 650.0000 mg | Freq: Four times a day (QID) | RECTAL | Status: DC | PRN

## 2023-06-02 NOTE — ED Notes (Signed)
Receiving RN Barkley Bruns has agreed to accept [facility] once pt has arrived to inpatient unit, all questions and concerns address.

## 2023-06-02 NOTE — ED Triage Notes (Signed)
Pt arrived via EMS From doc office. Last week pt had stomach bug w/ Syncopal episodes and severe diarrhea last week. Saturday night last episode of diarrhea. Hypotensive. Doctor office unable to get a bp.. hard stick.

## 2023-06-02 NOTE — H&P (Signed)
History and Physical      Melissa Allen LKG:[id number] DOB: 21-May-1966 DOA: 06/02/2023; DOS: 06/02/2023  PCP: Dois Davenport, MD *** Patient coming from: home ***  I have personally briefly reviewed patient's old medical records in [facility]  Chief Complaint: ***  HPI: Melissa Allen is a 57 y.o. female with medical history significant for *** who is admitted to [facility] on 06/02/2023 with *** after presenting from home*** to [facility] ED complaining of ***.   ***        ***  ED Course:  Vital signs in the ED were notable for the following: ***  Labs were notable for the following: ***  Per my interpretation, EKG in ED demonstrated the following:  ***  Imaging in the ED, per corresponding formal radiology read, was notable for the following: ***  While in the ED, the following were administered: ***  Subsequently, the patient was admitted  ***  ***red   Review of Systems: As per HPI otherwise 10 point review of systems negative.   Past Medical History:  Diagnosis Date   Allergy    Anginal pain (HCC)    one episode 4-5 years ago-[facility] [place]-nonspecific-no problems since-   Anxiety    Arthritis    multiple areas of arthritis- DDD   Asthma    recent admit to ER 01/11/12 for exac of asthma   Calcaneal fracture    Cancer (HCC)    Duplicated ureter, right    Family history of breast cancer    Family history of colon cancer    Family history of ovarian cancer    Family history of pancreatic cancer    Fibromyalgia    History of kidney stones    Hypertension    Obesity (BMI 30.0-34.9)    Pancreatitis 1986   Pneumonia    PONV (postoperative nausea and vomiting)    Poor venous access    hard to start iv-   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, was treated /w phenobarbital for a while, then taken off - 02/24/2020 occurred 30 years ago per patient   Shortness of breath dyspnea    Wears glasses     Past Surgical History:   Procedure Laterality Date   ABDOMINAL HYSTERECTOMY     ANTERIOR LAT LUMBAR FUSION Left 08/17/2018   Procedure: LUMBAR THREE-FOUR ANTERIOR LATERAL INTERBODY FUSION;  Surgeon: Julio Sicks, MD;  Location: [facility];  Service: Neurosurgery;  Laterality: Left;   APPENDECTOMY  08/05/2017   BLADDER SUSPENSION  03/01/2012   Procedure: TRANSVAGINAL TAPE (TVT) PROCEDURE;  Surgeon: Allie Bossier, MD;  Location: [facility];  Service: Gynecology;  Laterality: N/A;   BREAST CAPSULECTOMY WITH IMPLANT EXCHANGE Bilateral 04/10/2023   Procedure: BREAST CAPSULORRHAPHY WITH SILICONE IMPLANT EXCHANGE;  Surgeon: Glenna Fellows, MD;  Location: [facility];  Service: Plastics;  Laterality: Bilateral;   BREAST RECONSTRUCTION WITH PLACEMENT OF TISSUE EXPANDER AND ALLODERM Bilateral 10/19/2020   Procedure: BILATERAL BREAST RECONSTRUCTION WITH PLACEMENT OF TISSUE EXPANDER AND ALLODERM;  Surgeon: Glenna Fellows, MD;  Location: [facility];  Service: Plastics;  Laterality: Bilateral;   BUNIONECTOMY  1995   Right   CHOLECYSTECTOMY  late 1980's       CYSTOSCOPY  03/01/2012   Procedure: CYSTOSCOPY;  Surgeon: Allie Bossier, MD;  Location: [facility];  Service: Gynecology;  Laterality: N/A;   CYSTOSCOPY W/ URETERAL STENT PLACEMENT Left 12/15/2014   Procedure: CYSTOSCOPY, LEFT URETEROSCOPYU WITH STONE  EXTRACTION;  Surgeon: Bjorn Pippin, MD;  Location: [facility];  Service: Urology;  Laterality: Left;   CYSTOSCOPY W/ URETERAL STENT PLACEMENT Left 10/02/2016   Procedure: CYSTOSCOPY WITH RETROGRADE PYELOGRAM/URETERAL STENT PLACEMENT ureteroscopy, basket extraction;  Surgeon: Malen Gauze, MD;  Location: [facility];  Service: Urology;  Laterality: Left;   HARDWARE REMOVAL N/A 12/22/2016   Procedure: Lumbar Four-Five Removal of Hardware;  Surgeon: Julio Sicks, MD;  Location: [facility] OR;  Service: Neurosurgery;  Laterality: N/A;   INCISION AND DRAINAGE ABSCESS Left 07/19/2013   Procedure: INCISION AND DRAINAGE LEFT LOWER  EXTERMITY HEMATOMA;  Surgeon: Wilmon Arms. Corliss Skains, MD;  Location: [facility];  Service: General;  Laterality: Left;  Excision left lower leg mass   KNEE CARTILAGE SURGERY     left   KNEE SURGERY Left    x 2, Murphy/Sue, corrected Patella displacement    LAMINECTOMY     LAPAROSCOPIC APPENDECTOMY N/A 08/05/2017   Procedure: APPENDECTOMY LAPAROSCOPIC;  Surgeon: Rodman Pickle, MD;  [facility]: [facility];  Service: General;  Laterality: N/A;   LIPOSUCTION Bilateral 04/10/2023   Procedure: BILATERAL CHEST WALLS LIPOSUCTION;  Surgeon: Glenna Fellows, MD;  Location: [facility];  Service: Plastics;  Laterality: Bilateral;   LUMBAR LAMINECTOMY  06/2005   L4-5, Ray   LUMBAR PERCUTANEOUS PEDICLE SCREW 1 LEVEL Left 08/17/2018   Procedure: LUMBAR THREE-FOUR LUMBAR PERCUTANEOUS PEDICLE SCREW;  Surgeon: Julio Sicks, MD;  [facility];  Service: Neurosurgery;  Laterality: Left;   MASTECTOMY W/ SENTINEL NODE BIOPSY Bilateral 10/19/2020   Procedure: BILATERAL MASTECTOMY WITH LEFT SENTINEL LYMPH NODE BIOPSY;  Surgeon: Griselda Miner, MD;  Location: [facility];  Service: General;  Laterality: Bilateral;   POSTERIOR CERVICAL LAMINECTOMY Right 02/28/2020   Procedure: Right Cervical Six-Seven Laminectomy and Foraminotomy;  Surgeon: Julio Sicks, MD;  Location: [facility] OR;  Service: Neurosurgery;  Laterality: Right;  3C   R compressed pronator Right    Sypher, R arm   REMOVAL OF BILATERAL TISSUE EXPANDERS WITH PLACEMENT OF BILATERAL BREAST IMPLANTS Bilateral 01/28/2021   Procedure: REMOVAL OF BILATERAL TISSUE EXPANDERS WITH PLACEMENT OF BILATERAL BREAST IMPLANTS WITH ACELLULAR DERMIS;  Surgeon: Glenna Fellows, MD;  Location: [facility];  Service: Plastics;  Laterality: Bilateral;   RESECTION DISTAL CLAVICAL Right 06/30/2017   Procedure: RIGHT RESECTION DISTAL CLAVICAL;  Surgeon: Marcene Corning, MD;  [facility]: [facility];  Service: Orthopedics;  Laterality:  Right;   SHOULDER ACROMIOPLASTY Right 06/30/2017   Procedure: RIGHT SHOULDER ACROMIOPLASTY;  Surgeon: Marcene Corning, MD;  [facility]: [facility];  Service: Orthopedics;  Laterality: Right;   SHOULDER ARTHROSCOPY Right 06/30/2017   Procedure: ARTHROSCOPY DEBRIDEMENT RIGHT SHOULDER;  Surgeon: Marcene Corning, MD;  Location: [facility];  Service: Orthopedics;  Laterality: Right;   SPINAL FUSION     TENDON REPAIR  2013   TENDON REPAIR Right 02/01/2012   TONSILLECTOMY  age 66   TOTAL KNEE ARTHROPLASTY Left 08/12/2022   Procedure: LEFT TOTAL KNEE ARTHROPLASTY;  Surgeon: Marcene Corning, MD;  Location: [facility];  Service: Orthopedics;  Laterality: Left;   TOTAL SHOULDER ARTHROPLASTY Right 08/22/2021   Procedure: TOTAL SHOULDER ARTHROPLASTY;  Surgeon: Jones Broom, MD;  Location: [facility];  Service: Orthopedics;  Laterality: Right;    Social History:  reports that she has never smoked. She has never used smokeless tobacco. She reports that she does not currently use alcohol. She reports that she does not use drugs.   Allergies  Allergen Reactions   Chlorquinaldol Rash  11/30/19 patch testing: positive reaction to Quinoline Mix (contains clioquinol and chlorquinaldol)   Clioquinol Rash    11/30/19 patch testing: positive reaction to Quinoline Mix (contains clioquinol and chlorquinaldol)    Other Anaphylaxis    Pt has anaphylactic reaction to something during procedure - unsure of what   Per patient, Quinoline Mix  - based on allergy testing   Latex Itching, Swelling and Dermatitis    SWELLING FROM FACE MASK RING AFTER LAST SURGERY    Lisinopril Cough        Codeine Nausea And Vomiting   Hydrocodone Itching   Tape Rash    TOLERATES PAPER TAPE ONLY NO PINK SURGICAL TAPE EITHER, EKG Leads    Wound Dressing Adhesive Rash    Dermabond    Family History  Problem Relation Age of Onset   Cancer Mother        pancreatic cancer   Heart disease Father 50   Colon polyps Father    Leukemia Father     Dementia Father    Breast cancer Maternal Grandmother    Ovarian cancer Maternal Grandmother 52   Colon cancer Paternal Grandfather        dx < 50   Uterine cancer Other        Grandmother   Breast cancer Other        Grandmother   Ovarian cancer Other        Grandmother   Diabetes Other        mother   Other Neg Hx     Family history reviewed and not pertinent ***   Prior to Admission medications   Medication Sig Start Date End Date Taking? Authorizing Provider  albuterol [facility] HFA) 108 (90 Base) MCG/ACT inhaler INHALE TWO PUFFS EVERY FOUR HOURS AS NEEDED SHORTNESS OF BREATH 08/27/16   Corwin Levins, MD  ARIPiprazole (ABILIFY) 10 MG tablet Take 10 mg by mouth daily. 07/23/22   [provider]  aspirin EC 81 MG tablet Take 1 tablet (81 mg total) by mouth 2 (two) times daily after a meal. For 2 weeks then once a day for 2 weeks for DVT prevention. 08/13/22 08/13/23  Elodia Florence, PA-C  Daridorexant HCl (QUVIVIQ PO) Take by mouth at bedtime.    [provider]  doxepin (SINEQUAN) 10 MG capsule Take 20 mg by mouth at bedtime.    [provider]  DULoxetine (CYMBALTA) 30 MG capsule Take 90 mg by mouth at bedtime. 08/15/19   [provider]  EPINEPHrine 0.3 mg/0.3 mL IJ SOAJ injection Inject 0.3 mLs (0.3 mg total) into the muscle as needed for anaphylaxis. 10/04/19   Everrett Coombe, DO  estradiol (ESTRACE) 1 MG tablet TAKE 1 TABLET BY MOUTH ONCE A DAY 07/21/21   Reva Bores, MD  Eszopiclone 3 MG TABS Take 3 mg by mouth at bedtime. 05/30/22   [provider]  fluticasone (FLONASE) 50 MCG/ACT nasal spray USE 2 SPRAYS INTO EACH NOSTRIL ONCE DAILY AS DIRECTED 03/20/20   Mliss Sax, MD  HYDROcodone-acetaminophen [facility]) 10-325 MG tablet Take 1 tablet by mouth 4 (four) times daily as needed for moderate pain. 07/24/22   [provider]  levocetirizine (XYZAL) 5 MG tablet Take 5 mg by mouth every evening.    [provider]  losartan (COZAAR) 50 MG tablet Take 1 tablet (50 mg total) by mouth daily. Patient taking differently: Take 50 mg by mouth in the morning and at bedtime. 03/15/21   Nadene Rubins  Gaspar Garbe, MD  Multiple Vitamin (MULTIVITAMIN WITH MINERALS) TABS tablet Take 1 tablet by mouth daily.    [provider]  oxyCODONE-acetaminophen (PERCOCET) 5-325 MG tablet Take 1 tablet by mouth 4 times a day 11/06/22     QUEtiapine (SEROQUEL) 400 MG tablet Take 400 mg by mouth at bedtime.    [provider]  RESTASIS 0.05 % ophthalmic emulsion Place 1 drop into both eyes 2 (two) times daily. 04/17/20   [provider]  sulfamethoxazole-trimethoprim (BACTRIM DS) 800-160 MG tablet Take 1 tablet by mouth 2 (two) times daily. 01/28/21   Glenna Fellows, MD  tiZANidine (ZANAFLEX) 4 MG tablet Take 6 mg by mouth 3 (three) times daily.    [provider]  tiZANidine (ZANAFLEX) 4 MG tablet Take 1 and 1/2 tablets by mouth 3 times a day as needed 11/06/22     triamterene-hydrochlorothiazide (MAXZIDE-25) 37.5-25 MG tablet TAKE 1 TABLET BY MOUTH ONCE A DAY 03/17/21   Mliss Sax, MD     Objective    Physical Exam: Vitals:   06/02/23 1930 06/02/23 2000 06/02/23 2030 06/02/23 2100  BP: 113/74 117/69 108/69 121/76  Pulse: (!) 104 (!) 103 (!) 10 (!) 104  Resp: 16 20 17 18   Temp:      TempSrc:      SpO2: 97% 95% 98% 98%    General: appears to be stated age; alert, oriented Skin: warm, dry, no rash Head:  AT/[place] Mouth:  Oral mucosa membranes appear moist, normal dentition Neck: supple; trachea midline Heart:  RRR; did not appreciate any M/R/G Lungs: CTAB, did not appreciate any wheezes, rales, or rhonchi Abdomen: + BS; soft, ND, NT Vascular: 2+ pedal pulses b/l; 2+ radial pulses b/l Extremities: no peripheral edema, no muscle wasting Neuro: strength and sensation intact in upper and lower extremities b/l    *** Neuro: 5/5 strength of the proximal and distal flexors and  extensors of the upper and lower extremities bilaterally; sensation intact in upper and lower extremities b/l; cranial nerves II through XII grossly intact; no pronator drift; no evidence suggestive of slurred speech, dysarthria, or facial droop; Normal muscle tone. No tremors. *** Neuro: In the setting of the patient's current mental status and associated inability to follow instructions, unable to perform full neurologic exam at this time.  As such, assessment of strength, sensation, and cranial nerves is limited at this time. Patient noted to spontaneously move all 4 extremities. No tremors.  ***    Labs on Admission: I have personally reviewed following labs and imaging studies  CBC: Recent Labs  Lab 06/02/23 1738  WBC 9.7  NEUTROABS 6.6  HGB 14.7  HCT 44.3  MCV 89.1  PLT 276   Basic Metabolic Panel: Recent Labs  Lab 06/02/23 1738 06/02/23 2024  NA 137  --   K 3.0*  --   CL 97*  --   CO2 19*  --   GLUCOSE 82  --   BUN 44*  --   CREATININE 2.94*  --   CALCIUM 9.5  --   MG  --  1.9   GFR: CrCl cannot be calculated (Unknown ideal weight.). Liver Function Tests: Recent Labs  Lab 06/02/23 1738  AST 26  ALT 25  ALKPHOS 81  BILITOT 1.6*  PROT 8.2*  ALBUMIN 4.6   Recent Labs  Lab 06/02/23 1738  LIPASE 45   No results for input(s): "AMMONIA" in the last 168 hours. Coagulation Profile: No results for input(s): "INR", "PROTIME" in the  last 168 hours. Cardiac Enzymes: No results for input(s): "CKTOTAL", "CKMB", "CKMBINDEX", "TROPONINI" in the last 168 hours. BNP (last 3 results) No results for input(s): "PROBNP" in the last 8760 hours. HbA1C: No results for input(s): "HGBA1C" in the last 72 hours. CBG: No results for input(s): "GLUCAP" in the last 168 hours. Lipid Profile: No results for input(s): "CHOL", "HDL", "LDLCALC", "TRIG", "CHOLHDL", "LDLDIRECT" in the last 72 hours. Thyroid Function Tests: No results for input(s): "TSH", "T4TOTAL", "FREET4",  "T3FREE", "THYROIDAB" in the last 72 hours. Anemia Panel: No results for input(s): "VITAMINB12", "FOLATE", "FERRITIN", "TIBC", "IRON", "RETICCTPCT" in the last 72 hours. Urine analysis:    Component Value Date/Time   COLORURINE YELLOW 12/27/2019 1127   APPEARANCEUR Sl Cloudy (A) 12/27/2019 1127   LABSPEC 1.020 12/27/2019 1127   PHURINE 8.5 (A) 12/27/2019 1127   GLUCOSEU NEGATIVE 12/27/2019 1127   HGBUR NEGATIVE 12/27/2019 1127   BILIRUBINUR NEGATIVE 12/27/2019 1127   BILIRUBINUR neg 09/09/2017 1642   KETONESUR NEGATIVE 12/27/2019 1127   PROTEINUR NEGATIVE 08/17/2019 1324   UROBILINOGEN 0.2 12/27/2019 1127   NITRITE NEGATIVE 12/27/2019 1127   LEUKOCYTESUR SMALL (A) 12/27/2019 1127    Radiological Exams on Admission: DG Chest Port 1 View  Result Date: 06/02/2023 CLINICAL DATA:  Syncope. EXAM: PORTABLE CHEST 1 VIEW COMPARISON:  August 05, 2022. FINDINGS: The heart size and mediastinal contours are within normal limits. Both lungs are clear. The visualized skeletal structures are unremarkable. IMPRESSION: No active disease. Electronically Signed   By: Lupita Raider M.D.   On: 06/02/2023 18:45      Assessment/Plan    Principal Problem:   AKI (acute kidney injury) (HCC)  ***              ***                ***               ***               ***               ***              ***               ***               ***               ***               ***               ***               ***              ***     DVT prophylaxis: SCD's ***  Code Status: Full code*** Family Communication: none*** Disposition Plan: Per Rounding Team Consults called: none***;  Admission status: ***    I SPENT GREATER THAN 75 *** MINUTES IN CLINICAL CARE TIME/MEDICAL DECISION-MAKING IN COMPLETING THIS  ADMISSION.     Chaney Born Rubyann Lingle DO [facility] From 7PM - 7AM   06/02/2023, 10:11 PM   ***

## 2023-06-02 NOTE — ED Provider Notes (Signed)
[place] EMERGENCY DEPARTMENT AT [facility] Provider Note   CSN: [id number] Arrival date & time: 06/02/23  1609     History {Add pertinent medical, surgical, social history, OB history to HPI:1} No chief complaint on file.   Melissa Allen is a 57 y.o. female.  HPI 57 year old female history of breast cancer status post bilateral mastectomy, anxiety, hypertension presenting for concern for dehydration.  She states last week she had diarrhea for several days which is since resolved.  No bloody stools.  She started to worsen Friday, since Friday she has had frequent presyncope and dizziness that every time she stands up.  She is fallen a couple times last on Saturday.  She hit the back of her head on Saturday, she has mild soreness of her head but no headache or nausea or vomiting.  No neck pain.  She denies any chest pain or shortness of breath.  No abdominal pain.  No vomiting.  No fevers or chills.  She feels diffusely weak but no focal weakness or numbness.     Home Medications Prior to Admission medications   Medication Sig Start Date End Date Taking? Authorizing Provider  albuterol [facility] HFA) 108 (90 Base) MCG/ACT inhaler INHALE TWO PUFFS EVERY FOUR HOURS AS NEEDED SHORTNESS OF BREATH 08/27/16   Corwin Levins, MD  ARIPiprazole (ABILIFY) 10 MG tablet Take 10 mg by mouth daily. 07/23/22   [provider]  aspirin EC 81 MG tablet Take 1 tablet (81 mg total) by mouth 2 (two) times daily after a meal. For 2 weeks then once a day for 2 weeks for DVT prevention. 08/13/22 08/13/23  Elodia Florence, PA-C  Daridorexant HCl (QUVIVIQ PO) Take by mouth at bedtime.    [provider]  doxepin (SINEQUAN) 10 MG capsule Take 20 mg by mouth at bedtime.    [provider]  DULoxetine (CYMBALTA) 30 MG capsule Take 90 mg by mouth at bedtime. 08/15/19   [provider]  EPINEPHrine 0.3 mg/0.3 mL IJ SOAJ injection Inject 0.3 mLs (0.3 mg total) into the muscle as  needed for anaphylaxis. 10/04/19   Everrett Coombe, DO  estradiol (ESTRACE) 1 MG tablet TAKE 1 TABLET BY MOUTH ONCE A DAY 07/21/21   Reva Bores, MD  Eszopiclone 3 MG TABS Take 3 mg by mouth at bedtime. 05/30/22   [provider]  fluticasone (FLONASE) 50 MCG/ACT nasal spray USE 2 SPRAYS INTO EACH NOSTRIL ONCE DAILY AS DIRECTED 03/20/20   Mliss Sax, MD  HYDROcodone-acetaminophen [facility]) 10-325 MG tablet Take 1 tablet by mouth 4 (four) times daily as needed for moderate pain. 07/24/22   [provider]  levocetirizine (XYZAL) 5 MG tablet Take 5 mg by mouth every evening.    [provider]  losartan (COZAAR) 50 MG tablet Take 1 tablet (50 mg total) by mouth daily. Patient taking differently: Take 50 mg by mouth in the morning and at bedtime. 03/15/21   Mliss Sax, MD  Multiple Vitamin (MULTIVITAMIN WITH MINERALS) TABS tablet Take 1 tablet by mouth daily.    [provider]  oxyCODONE-acetaminophen (PERCOCET) 5-325 MG tablet Take 1 tablet by mouth 4 times a day 11/06/22     QUEtiapine (SEROQUEL) 400 MG tablet Take 400 mg by mouth at bedtime.    [provider]  RESTASIS 0.05 % ophthalmic emulsion Place 1 drop into both eyes 2 (two) times daily. 04/17/20   [provider]  sulfamethoxazole-trimethoprim (BACTRIM DS) 800-160 MG tablet  Take 1 tablet by mouth 2 (two) times daily. 01/28/21   Glenna Fellows, MD  tiZANidine (ZANAFLEX) 4 MG tablet Take 6 mg by mouth 3 (three) times daily.    [provider]  tiZANidine (ZANAFLEX) 4 MG tablet Take 1 and 1/2 tablets by mouth 3 times a day as needed 11/06/22     triamterene-hydrochlorothiazide (MAXZIDE-25) 37.5-25 MG tablet TAKE 1 TABLET BY MOUTH ONCE A DAY 03/17/21   Mliss Sax, MD      Allergies    Chlorquinaldol, Clioquinol, Other, Latex, Lisinopril, Codeine, Hydrocodone, Tape, and Wound dressing adhesive    Review of Systems   Review of Systems Review of  systems completed and notable as per HPI.  ROS otherwise negative.   Physical Exam Updated Vital Signs LMP 12/07/2011  Physical Exam Vitals and nursing note reviewed.  Constitutional:      General: She is not in acute distress.    Appearance: She is well-developed.  HENT:     Head: Normocephalic and atraumatic.     Nose: Nose normal.     Mouth/Throat:     Mouth: Mucous membranes are dry.     Pharynx: Oropharynx is clear.  Eyes:     Extraocular Movements: Extraocular movements intact.     Conjunctiva/sclera: Conjunctivae normal.     Pupils: Pupils are equal, round, and reactive to light.  Cardiovascular:     Rate and Rhythm: Normal rate and regular rhythm.     Heart sounds: No murmur heard. Pulmonary:     Effort: Pulmonary effort is normal. No respiratory distress.     Breath sounds: Normal breath sounds.  Abdominal:     Palpations: Abdomen is soft.     Tenderness: There is no abdominal tenderness. There is no guarding or rebound.  Musculoskeletal:        General: No swelling.     Cervical back: Normal range of motion and neck supple. No rigidity or tenderness.     Right lower leg: No edema.     Left lower leg: No edema.  Skin:    General: Skin is warm and dry.     Capillary Refill: Capillary refill takes less than 2 seconds.  Neurological:     General: No focal deficit present.     Mental Status: She is alert and oriented to person, place, and time. Mental status is at baseline.  Psychiatric:     Comments: Anxious     ED Results / Procedures / Treatments   Labs (all labs ordered are listed, but only abnormal results are displayed) Labs Reviewed - No data to display  EKG None  Radiology No results found.  Procedures Procedures  {Document cardiac monitor, telemetry assessment procedure when appropriate:1}  Medications Ordered in ED Medications - No data to display  ED Course/ Medical Decision Making/ A&P   {   Click here for ABCD2, HEART and other  calculatorsREFRESH Note before signing :1}                              Medical Decision Making  Medical Decision Making:   Melissa Allen is a 57 y.o. female who presented to the ED today with diarrhea, dizziness and presyncope.  On exam she appears somewhat dry likely related to recent GI losses and poor p.o. intake.  I suspect her dizziness is related to presyncope.  She did hit her head a few days ago, but no obvious head  trauma, no neck pain, no nausea or vomiting or headache have low suspicion for intracranial injury.  Will plan to rehydrate, obtain lab workup and chest x-ray.   {crccomplexity:27900} Reviewed and confirmed nursing documentation for past medical history, family history, social history.  Reassessment and Plan:   ***    Patient's presentation is most consistent with {EM COPA:27473}     {Document critical care time when appropriate:1} {Document review of labs and clinical decision tools ie heart score, Chads2Vasc2 etc:1}  {Document your independent review of radiology images, and any outside records:1} {Document your discussion with family members, caretakers, and with consultants:1} {Document social determinants of health affecting pt's care:1} {Document your decision making why or why not admission, treatments were needed:1} Final Clinical Impression(s) / ED Diagnoses Final diagnoses:  None    Rx / DC Orders ED Discharge Orders     None

## 2023-06-02 NOTE — ED Notes (Signed)
ED TO INPATIENT HANDOFF REPORT  ED Nurse Name and Phone #: Elnita Maxwell [phone]  S Name/Age/Gender Melissa Allen 57 y.o. female Room/Bed: WA25/WA25  Code Status   Code Status: Full Code  Home/SNF/Other Home Patient oriented to: self, place, time, and situation Is this baseline? Yes   Triage Complete: Triage complete  Chief Complaint AKI (acute kidney injury) (HCC) [N17.9]  Triage Note Pt arrived via EMS From doc office. Last week pt had stomach bug w/ Syncopal episodes and severe diarrhea last week. Saturday night last episode of diarrhea. Hypotensive. Doctor office unable to get a bp.. hard stick.   Allergies Allergies  Allergen Reactions   Chlorquinaldol Rash    11/30/19 patch testing: positive reaction to Quinoline Mix (contains clioquinol and chlorquinaldol)   Clioquinol Rash    11/30/19 patch testing: positive reaction to Quinoline Mix (contains clioquinol and chlorquinaldol)    Other Anaphylaxis    Pt has anaphylactic reaction to something during procedure - unsure of what   Per patient, Quinoline Mix  - based on allergy testing   Latex Itching, Swelling and Dermatitis    SWELLING FROM FACE MASK RING AFTER LAST SURGERY    Lisinopril Cough        Codeine Nausea And Vomiting   Hydrocodone Itching   Tape Rash    TOLERATES PAPER TAPE ONLY NO PINK SURGICAL TAPE EITHER, EKG Leads    Wound Dressing Adhesive Rash    Dermabond    Level of Care/Admitting Diagnosis ED Disposition     ED Disposition  Admit   Condition  --   Comment  Hospital Area: [facility] [[id number]]  Level of Care: Telemetry [5]  Admit to tele based on following criteria: Monitor for Ischemic changes  May place patient in observation at [facility] or [facility] if equivalent level of care is available:: No  Covid Evaluation: Asymptomatic - no recent exposure (last 10 days) testing not required  Diagnosis: AKI (acute kidney injury) [facility]) [[id number]]  Admitting Physician: Angie Fava [[id number]]  Attending Physician: Angie Fava [[id number]]          B Medical/Surgery History Past Medical History:  Diagnosis Date   Allergy    Anginal pain (HCC)    one episode 4-5 years ago-[facility]-nonspecific-no problems since-   Anxiety    Arthritis    multiple areas of arthritis- DDD   Asthma    recent admit to ER 01/11/12 for exac of asthma   Calcaneal fracture    Cancer (HCC)    Duplicated ureter, right    Family history of breast cancer    Family history of colon cancer    Family history of ovarian cancer    Family history of pancreatic cancer    Fibromyalgia    History of kidney stones    Hypertension    Obesity (BMI 30.0-34.9)    Pancreatitis 1986   Pneumonia    PONV (postoperative nausea and vomiting)    Poor venous access    hard to start iv-   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, was treated /w phenobarbital for a while, then taken off - 02/24/2020 occurred 30 years ago per patient   Shortness of breath dyspnea    Wears glasses    Past Surgical History:  Procedure Laterality Date   ABDOMINAL HYSTERECTOMY     ANTERIOR LAT LUMBAR FUSION Left 08/17/2018   Procedure: LUMBAR THREE-FOUR ANTERIOR LATERAL INTERBODY FUSION;  Surgeon: Julio Sicks, MD;  [facility]: [facility];  Service: Neurosurgery;  Laterality: Left;   APPENDECTOMY  08/05/2017   BLADDER SUSPENSION  03/01/2012   Procedure: TRANSVAGINAL TAPE (TVT) PROCEDURE;  Surgeon: Allie Bossier, MD;  Location: [facility];  Service: Gynecology;  Laterality: N/A;   BREAST CAPSULECTOMY WITH IMPLANT EXCHANGE Bilateral 04/10/2023   Procedure: BREAST CAPSULORRHAPHY WITH SILICONE IMPLANT EXCHANGE;  Surgeon: Glenna Fellows, MD;  Location: [facility];  Service: Plastics;  Laterality: Bilateral;   BREAST RECONSTRUCTION WITH PLACEMENT OF TISSUE EXPANDER AND ALLODERM Bilateral 10/19/2020   Procedure: BILATERAL BREAST RECONSTRUCTION WITH PLACEMENT OF TISSUE EXPANDER AND ALLODERM;   Surgeon: Glenna Fellows, MD;  Location: [facility];  Service: Plastics;  Laterality: Bilateral;   BUNIONECTOMY  1995   Right   CHOLECYSTECTOMY  late 1980's       CYSTOSCOPY  03/01/2012   Procedure: CYSTOSCOPY;  Surgeon: Allie Bossier, MD;  Location: [facility];  Service: Gynecology;  Laterality: N/A;   CYSTOSCOPY W/ URETERAL STENT PLACEMENT Left 12/15/2014   Procedure: CYSTOSCOPY, LEFT URETEROSCOPYU WITH STONE EXTRACTION;  Surgeon: Bjorn Pippin, MD;  Location: [facility];  Service: Urology;  Laterality: Left;   CYSTOSCOPY W/ URETERAL STENT PLACEMENT Left 10/02/2016   Procedure: CYSTOSCOPY WITH RETROGRADE PYELOGRAM/URETERAL STENT PLACEMENT ureteroscopy, basket extraction;  Surgeon: Malen Gauze, MD;  Location: [facility];  Service: Urology;  Laterality: Left;   HARDWARE REMOVAL N/A 12/22/2016   Procedure: Lumbar Four-Five Removal of Hardware;  Surgeon: Julio Sicks, MD;  Location[facility];  Service: Neurosurgery;  Laterality: N/A;   INCISION AND DRAINAGE ABSCESS Left 07/19/2013   Procedure: INCISION AND DRAINAGE LEFT LOWER EXTERMITY HEMATOMA;  Surgeon: Wilmon Arms. Corliss Skains, MD;  Location: [facility];  Service: General;  Laterality: Left;  Excision left lower leg mass   KNEE CARTILAGE SURGERY     left   KNEE SURGERY Left    x 2, Murphy/Sue, corrected Patella displacement    LAMINECTOMY     LAPAROSCOPIC APPENDECTOMY N/A 08/05/2017   Procedure: APPENDECTOMY LAPAROSCOPIC;  Surgeon: Rodman Pickle, MD;  [facility];  Service: General;  Laterality: N/A;   LIPOSUCTION Bilateral 04/10/2023   Procedure: BILATERAL CHEST WALLS LIPOSUCTION;  Surgeon: Glenna Fellows, MD;  Location: [facility];  Service: Plastics;  Laterality: Bilateral;   LUMBAR LAMINECTOMY  06/2005   L4-5, Ray   LUMBAR PERCUTANEOUS PEDICLE SCREW 1 LEVEL Left 08/17/2018   Procedure: LUMBAR THREE-FOUR LUMBAR PERCUTANEOUS PEDICLE SCREW;  Surgeon: Julio Sicks, MD;  [facility];  Service:  Neurosurgery;  Laterality: Left;   MASTECTOMY W/ SENTINEL NODE BIOPSY Bilateral 10/19/2020   Procedure: BILATERAL MASTECTOMY WITH LEFT SENTINEL LYMPH NODE BIOPSY;  Surgeon: Griselda Miner, MD;  Location: [facility];  Service: General;  Laterality: Bilateral;   POSTERIOR CERVICAL LAMINECTOMY Right 02/28/2020   Procedure: Right Cervical Six-Seven Laminectomy and Foraminotomy;  Surgeon: Julio Sicks, MD;  Location: [facility] OR;  Service: Neurosurgery;  Laterality: Right;  3C   R compressed pronator Right    Sypher, R arm   REMOVAL OF BILATERAL TISSUE EXPANDERS WITH PLACEMENT OF BILATERAL BREAST IMPLANTS Bilateral 01/28/2021   Procedure: REMOVAL OF BILATERAL TISSUE EXPANDERS WITH PLACEMENT OF BILATERAL BREAST IMPLANTS WITH ACELLULAR DERMIS;  Surgeon: Glenna Fellows, MD;  Location: [facility];  Service: Plastics;  Laterality: Bilateral;   RESECTION DISTAL CLAVICAL Right 06/30/2017   Procedure: RIGHT RESECTION DISTAL CLAVICAL;  Surgeon: Marcene Corning, MD;  [facility];  Service: Orthopedics;  Laterality: Right;  SHOULDER ACROMIOPLASTY Right 06/30/2017   Procedure: RIGHT SHOULDER ACROMIOPLASTY;  Surgeon: Marcene Corning, MD;  [facility];  Service: Orthopedics;  Laterality: Right;   SHOULDER ARTHROSCOPY Right 06/30/2017   Procedure: ARTHROSCOPY DEBRIDEMENT RIGHT SHOULDER;  Surgeon: Marcene Corning, MD;  [facility];  Service: Orthopedics;  Laterality: Right;   SPINAL FUSION     TENDON REPAIR  2013   TENDON REPAIR Right 02/01/2012   TONSILLECTOMY  age 6   TOTAL KNEE ARTHROPLASTY Left 08/12/2022   Procedure: LEFT TOTAL KNEE ARTHROPLASTY;  Surgeon: Marcene Corning, MD;  [facility]: [facility];  Service: Orthopedics;  Laterality: Left;   TOTAL SHOULDER ARTHROPLASTY Right 08/22/2021   Procedure: TOTAL SHOULDER ARTHROPLASTY;  Surgeon: Jones Broom, MD;  [facility];  Service: Orthopedics;  Laterality: Right;     A IV Location/Drains/Wounds Patient  Lines/Drains/Airways Status     Active Line/Drains/Airways     Name Placement date Placement time Site Days   Peripheral IV 06/02/23 20 G 1" Anterior;Proximal;Right Forearm 06/02/23  1702  Forearm  less than 1            Intake/Output Last 24 hours  Intake/Output Summary (Last 24 hours) at 06/02/2023 2313 Last data filed at 06/02/2023 2030 Gross per 24 hour  Intake 1000 ml  Output --  Net 1000 ml    Labs/Imaging Results for orders placed or performed during the hospital encounter of 06/02/23 (from the past 48 hour(s))  Comprehensive metabolic panel     Status: Abnormal   Collection Time: 06/02/23  5:38 PM  Result Value Ref Range   Sodium 137 135 - 145 mmol/L    Comment: ELECTROLYTES REPEATED TO VERIFY   Potassium 3.0 (L) 3.5 - 5.1 mmol/L    Comment: ELECTROLYTES REPEATED TO VERIFY   Chloride 97 (L) 98 - 111 mmol/L    Comment: ELECTROLYTES REPEATED TO VERIFY   CO2 19 (L) 22 - 32 mmol/L    Comment: ELECTROLYTES REPEATED TO VERIFY   Glucose, Bld 82 70 - 99 mg/dL    Comment: Glucose reference range applies only to samples taken after fasting for at least 8 hours.   BUN 44 (H) 6 - 20 mg/dL   Creatinine, Ser 7.09 (H) 0.44 - 1.00 mg/dL   Calcium 9.5 8.9 - 62.8 mg/dL    Comment: ELECTROLYTES REPEATED TO VERIFY   Total Protein 8.2 (H) 6.5 - 8.1 g/dL   Albumin 4.6 3.5 - 5.0 g/dL   AST 26 15 - 41 U/L   ALT 25 0 - 44 U/L   Alkaline Phosphatase 81 38 - 126 U/L   Total Bilirubin 1.6 (H) 0.3 - 1.2 mg/dL   GFR, Estimated 18 (L) >60 mL/min    Comment: (NOTE) Calculated using the CKD-EPI Creatinine Equation (2021)    Anion gap 21 (H) 5 - 15    Comment: ELECTROLYTES REPEATED TO VERIFY Performed at [facility], [address], [place], [place] [address]   Lipase, blood     Status: None   Collection Time: 06/02/23  5:38 PM  Result Value Ref Range   Lipase 45 11 - 51 U/L    Comment: Performed at [facility], [address], [place],  [place] [address]  CBC with Differential     Status: None   Collection Time: 06/02/23  5:38 PM  Result Value Ref Range   WBC 9.7 4.0 - 10.5 K/uL   RBC 4.97 3.87 - 5.11 MIL/uL   Hemoglobin 14.7 12.0 - 15.0 g/dL  HCT 44.3 36.0 - 46.0 %   MCV 89.1 80.0 - 100.0 fL   MCH 29.6 26.0 - 34.0 pg   MCHC 33.2 30.0 - 36.0 g/dL   RDW 16.1 09.6 - 04.5 %   Platelets 276 150 - 400 K/uL   nRBC 0.0 0.0 - 0.2 %   Neutrophils Relative % 68 %   Neutro Abs 6.6 1.7 - 7.7 K/uL   Lymphocytes Relative 22 %   Lymphs Abs 2.2 0.7 - 4.0 K/uL   Monocytes Relative 8 %   Monocytes Absolute 0.8 0.1 - 1.0 K/uL   Eosinophils Relative 1 %   Eosinophils Absolute 0.1 0.0 - 0.5 K/uL   Basophils Relative 1 %   Basophils Absolute 0.1 0.0 - 0.1 K/uL   Immature Granulocytes 0 %   Abs Immature Granulocytes 0.04 0.00 - 0.07 K/uL    Comment: Performed at [facility], [address] [place], [place] [address]  Magnesium     Status: None   Collection Time: 06/02/23  8:24 PM  Result Value Ref Range   Magnesium 1.9 1.7 - 2.4 mg/dL    Comment: Performed at [facility], [address], [place], [place] [address]  Blood gas, venous     Status: Abnormal   Collection Time: 06/02/23  8:56 PM  Result Value Ref Range   pH, Ven 7.5 (H) 7.25 - 7.43   pCO2, Ven 27 (L) 44 - 60 mmHg   pO2, Ven 43 32 - 45 mmHg   Bicarbonate 21.1 20.0 - 28.0 mmol/L   Acid-base deficit 0.8 0.0 - 2.0 mmol/L   O2 Saturation 76.2 %   Patient temperature 37.0     Comment: Performed at [facility], [address], [place], [place] [address]  I-Stat Lactic Acid     Status: None   Collection Time: 06/02/23  9:08 PM  Result Value Ref Range   Lactic Acid, Venous 1.4 0.5 - 1.9 mmol/L  Urinalysis, Routine w reflex microscopic -Urine, Clean Catch     Status: Abnormal   Collection Time: 06/02/23 10:20 PM  Result Value Ref Range   Color, Urine YELLOW YELLOW   APPearance CLEAR CLEAR   Specific Gravity, Urine  1.013 1.005 - 1.030   pH 5.0 5.0 - 8.0   Glucose, UA NEGATIVE NEGATIVE mg/dL   Hgb urine dipstick NEGATIVE NEGATIVE   Bilirubin Urine NEGATIVE NEGATIVE   Ketones, ur 20 (A) NEGATIVE mg/dL   Protein, ur NEGATIVE NEGATIVE mg/dL   Nitrite NEGATIVE NEGATIVE   Leukocytes,Ua NEGATIVE NEGATIVE    Comment: Performed at [facility], [address], [place], [place] [address]   DG Chest Port 1 View  Result Date: 06/02/2023 CLINICAL DATA:  Syncope. EXAM: PORTABLE CHEST 1 VIEW COMPARISON:  August 05, 2022. FINDINGS: The heart size and mediastinal contours are within normal limits. Both lungs are clear. The visualized skeletal structures are unremarkable. IMPRESSION: No active disease. Electronically Signed   By: Lupita Raider M.D.   On: 06/02/2023 18:45    Pending Labs Unresulted Labs (From admission, onward)     Start     Ordered   06/03/23 0500  CBC with Differential/Platelet  Tomorrow morning,   R        06/02/23 2204   06/03/23 0500  Comprehensive metabolic panel  Tomorrow morning,   R        06/02/23 2204   06/03/23 0500  Magnesium  Tomorrow morning,   R  06/02/23 2204            Vitals/Pain Today's Vitals   06/02/23 2030 06/02/23 2100 06/02/23 2200 06/02/23 2213  BP: 108/69 121/76 138/64   Pulse: (!) 10 (!) 104 (!) 103   Resp: 17 18 20    Temp:    98.5 F (36.9 C)  TempSrc:      SpO2: 98% 98% 98%     Isolation Precautions No active isolations  Medications Medications  acetaminophen (TYLENOL) tablet 650 mg (has no administration in time range)    Or  acetaminophen (TYLENOL) suppository 650 mg (has no administration in time range)  melatonin tablet 3 mg (has no administration in time range)  ondansetron (ZOFRAN) injection 4 mg (has no administration in time range)  lactated ringers infusion (has no administration in time range)  lactated ringers bolus 1,000 mL (0 mLs Intravenous Stopped 06/02/23 2030)  lactated ringers bolus 1,000 mL (1,000  mLs Intravenous New Bag/Given 06/02/23 2110)  potassium chloride SA (KLOR-CON M) CR tablet 40 mEq (40 mEq Oral Given 06/02/23 2108)    Mobility walks     R Recommendations: See Admitting Provider Note   Additional Notes: A&O x4, reports movement makes her feel light headedness, pt cannot stand for long periods of time d/t feeling faint. +orthostatic vitals. Pt very sweet, husband went home for evening. Pt did ambulate to BR with NT assistance.

## 2023-06-03 ENCOUNTER — Encounter (HOSPITAL_COMMUNITY): Payer: Self-pay | Admitting: Internal Medicine

## 2023-06-03 DIAGNOSIS — Z7984 Long term (current) use of oral hypoglycemic drugs: Secondary | ICD-10-CM | POA: Diagnosis not present

## 2023-06-03 DIAGNOSIS — E860 Dehydration: Secondary | ICD-10-CM | POA: Diagnosis present

## 2023-06-03 DIAGNOSIS — J4520 Mild intermittent asthma, uncomplicated: Secondary | ICD-10-CM | POA: Diagnosis present

## 2023-06-03 DIAGNOSIS — Z803 Family history of malignant neoplasm of breast: Secondary | ICD-10-CM | POA: Diagnosis not present

## 2023-06-03 DIAGNOSIS — Z853 Personal history of malignant neoplasm of breast: Secondary | ICD-10-CM | POA: Diagnosis not present

## 2023-06-03 DIAGNOSIS — Z9882 Breast implant status: Secondary | ICD-10-CM | POA: Diagnosis not present

## 2023-06-03 DIAGNOSIS — Z96611 Presence of right artificial shoulder joint: Secondary | ICD-10-CM | POA: Diagnosis present

## 2023-06-03 DIAGNOSIS — E8729 Other acidosis: Secondary | ICD-10-CM | POA: Diagnosis present

## 2023-06-03 DIAGNOSIS — E874 Mixed disorder of acid-base balance: Secondary | ICD-10-CM | POA: Diagnosis present

## 2023-06-03 DIAGNOSIS — Z9049 Acquired absence of other specified parts of digestive tract: Secondary | ICD-10-CM | POA: Diagnosis not present

## 2023-06-03 DIAGNOSIS — E669 Obesity, unspecified: Secondary | ICD-10-CM | POA: Diagnosis present

## 2023-06-03 DIAGNOSIS — I10 Essential (primary) hypertension: Secondary | ICD-10-CM | POA: Diagnosis present

## 2023-06-03 DIAGNOSIS — R197 Diarrhea, unspecified: Secondary | ICD-10-CM | POA: Diagnosis present

## 2023-06-03 DIAGNOSIS — E876 Hypokalemia: Secondary | ICD-10-CM | POA: Diagnosis present

## 2023-06-03 DIAGNOSIS — Z87442 Personal history of urinary calculi: Secondary | ICD-10-CM | POA: Diagnosis not present

## 2023-06-03 DIAGNOSIS — F411 Generalized anxiety disorder: Secondary | ICD-10-CM | POA: Diagnosis present

## 2023-06-03 DIAGNOSIS — Z83719 Family history of colon polyps, unspecified: Secondary | ICD-10-CM | POA: Diagnosis not present

## 2023-06-03 DIAGNOSIS — I959 Hypotension, unspecified: Secondary | ICD-10-CM | POA: Diagnosis present

## 2023-06-03 DIAGNOSIS — Z981 Arthrodesis status: Secondary | ICD-10-CM | POA: Diagnosis not present

## 2023-06-03 DIAGNOSIS — Z96652 Presence of left artificial knee joint: Secondary | ICD-10-CM | POA: Diagnosis present

## 2023-06-03 DIAGNOSIS — Z8249 Family history of ischemic heart disease and other diseases of the circulatory system: Secondary | ICD-10-CM | POA: Diagnosis not present

## 2023-06-03 DIAGNOSIS — N179 Acute kidney failure, unspecified: Secondary | ICD-10-CM | POA: Diagnosis present

## 2023-06-03 DIAGNOSIS — M797 Fibromyalgia: Secondary | ICD-10-CM | POA: Diagnosis present

## 2023-06-03 DIAGNOSIS — Z9013 Acquired absence of bilateral breasts and nipples: Secondary | ICD-10-CM | POA: Diagnosis not present

## 2023-06-03 DIAGNOSIS — Z90710 Acquired absence of both cervix and uterus: Secondary | ICD-10-CM | POA: Diagnosis not present

## 2023-06-03 LAB — CBC WITH DIFFERENTIAL/PLATELET
Abs Immature Granulocytes: 0.01 10*3/uL (ref 0.00–0.07)
Basophils Absolute: 0.1 10*3/uL (ref 0.0–0.1)
Basophils Relative: 1 %
Eosinophils Absolute: 0.1 10*3/uL (ref 0.0–0.5)
Eosinophils Relative: 2 %
HCT: 36.4 % (ref 36.0–46.0)
Hemoglobin: 11.7 g/dL — ABNORMAL LOW (ref 12.0–15.0)
Immature Granulocytes: 0 %
Lymphocytes Relative: 33 %
Lymphs Abs: 2.2 10*3/uL (ref 0.7–4.0)
MCH: 29.7 pg (ref 26.0–34.0)
MCHC: 32.1 g/dL (ref 30.0–36.0)
MCV: 92.4 fL (ref 80.0–100.0)
Monocytes Absolute: 0.7 10*3/uL (ref 0.1–1.0)
Monocytes Relative: 10 %
Neutro Abs: 3.5 10*3/uL (ref 1.7–7.7)
Neutrophils Relative %: 54 %
Platelets: 209 10*3/uL (ref 150–400)
RBC: 3.94 MIL/uL (ref 3.87–5.11)
RDW: 13.2 % (ref 11.5–15.5)
WBC: 6.6 10*3/uL (ref 4.0–10.5)
nRBC: 0 % (ref 0.0–0.2)

## 2023-06-03 LAB — COMPREHENSIVE METABOLIC PANEL
ALT: 19 U/L (ref 0–44)
AST: 23 U/L (ref 15–41)
Albumin: 3.5 g/dL (ref 3.5–5.0)
Alkaline Phosphatase: 59 U/L (ref 38–126)
Anion gap: 13 (ref 5–15)
BUN: 35 mg/dL — ABNORMAL HIGH (ref 6–20)
CO2: 22 mmol/L (ref 22–32)
Calcium: 8.8 mg/dL — ABNORMAL LOW (ref 8.9–10.3)
Chloride: 102 mmol/L (ref 98–111)
Creatinine, Ser: 1.83 mg/dL — ABNORMAL HIGH (ref 0.44–1.00)
GFR, Estimated: 32 mL/min — ABNORMAL LOW (ref >60)
Glucose, Bld: 114 mg/dL — ABNORMAL HIGH (ref 70–99)
Potassium: 3.2 mmol/L — ABNORMAL LOW (ref 3.5–5.1)
Sodium: 137 mmol/L (ref 135–145)
Total Bilirubin: 1 mg/dL (ref 0.3–1.2)
Total Protein: 6 g/dL — ABNORMAL LOW (ref 6.5–8.1)

## 2023-06-03 LAB — BASIC METABOLIC PANEL
Anion gap: 14 (ref 5–15)
BUN: 28 mg/dL — ABNORMAL HIGH (ref 6–20)
CO2: 21 mmol/L — ABNORMAL LOW (ref 22–32)
Calcium: 9.1 mg/dL (ref 8.9–10.3)
Chloride: 102 mmol/L (ref 98–111)
Creatinine, Ser: 1.42 mg/dL — ABNORMAL HIGH (ref 0.44–1.00)
GFR, Estimated: 43 mL/min — ABNORMAL LOW (ref >60)
Glucose, Bld: 120 mg/dL — ABNORMAL HIGH (ref 70–99)
Potassium: 3.4 mmol/L — ABNORMAL LOW (ref 3.5–5.1)
Sodium: 137 mmol/L (ref 135–145)

## 2023-06-03 LAB — SODIUM, URINE, RANDOM: Sodium, Ur: 67 mmol/L

## 2023-06-03 LAB — MAGNESIUM: Magnesium: 1.6 mg/dL — ABNORMAL LOW (ref 1.7–2.4)

## 2023-06-03 LAB — CREATININE, URINE, RANDOM: Creatinine, Urine: 164 mg/dL

## 2023-06-03 LAB — PROTIME-INR
INR: 1.1 (ref 0.8–1.2)
Prothrombin Time: 14 seconds (ref 11.4–15.2)

## 2023-06-03 LAB — CK: Total CK: 73 U/L (ref 38–234)

## 2023-06-03 MED ORDER — ZOLPIDEM TARTRATE 5 MG PO TABS
5.0000 mg | ORAL_TABLET | Freq: Every evening | ORAL | Status: DC | PRN
  Administered 2023-06-03 (×2): 5 mg via ORAL
  Filled 2023-06-03 (×2): qty 1

## 2023-06-03 MED ORDER — ALBUTEROL SULFATE (2.5 MG/3ML) 0.083% IN NEBU: 2.5000 mg | INHALATION_SOLUTION | RESPIRATORY_TRACT | Status: DC | PRN

## 2023-06-03 MED ORDER — DULOXETINE HCL 30 MG PO CPEP
120.0000 mg | ORAL_CAPSULE | Freq: Every day | ORAL | Status: DC
  Administered 2023-06-03 – 2023-06-04 (×2): 120 mg via ORAL
  Filled 2023-06-03 (×2): qty 4

## 2023-06-03 MED ORDER — QUETIAPINE FUMARATE 100 MG PO TABS
400.0000 mg | ORAL_TABLET | Freq: Every day | ORAL | Status: DC
  Administered 2023-06-03 (×2): 400 mg via ORAL
  Filled 2023-06-03 (×2): qty 4

## 2023-06-03 MED ORDER — TIZANIDINE HCL 2 MG PO TABS
6.0000 mg | ORAL_TABLET | Freq: Three times a day (TID) | ORAL | Status: DC
  Administered 2023-06-03 (×2): 6 mg via ORAL
  Filled 2023-06-03 (×2): qty 3

## 2023-06-03 MED ORDER — OXYCODONE-ACETAMINOPHEN 5-325 MG PO TABS
1.0000 | ORAL_TABLET | Freq: Four times a day (QID) | ORAL | Status: DC | PRN
  Administered 2023-06-03 (×2): 1 via ORAL
  Filled 2023-06-03 (×2): qty 1

## 2023-06-03 MED ORDER — POTASSIUM CHLORIDE CRYS ER 20 MEQ PO TBCR
40.0000 meq | EXTENDED_RELEASE_TABLET | ORAL | Status: AC
  Administered 2023-06-03 (×2): 40 meq via ORAL
  Filled 2023-06-03 (×2): qty 2

## 2023-06-03 NOTE — Assessment & Plan Note (Signed)
Stable

## 2023-06-03 NOTE — Progress Notes (Signed)
PROGRESS NOTE    Melissa Allen  UJW:[id number] DOB: [birth year] DOA: 06/02/2023 PCP: Dois Davenport, MD  Subjective: Pt seen and examined. Having 1 week of diarrhea. Has improved in last 1 day. Only 1 small episode of diarrhea early this AM. Scr has improved. Was taking hydrochlorothiazide and ARB at home. No vomiting. Urinating well now.   Hospital Course: HPI: Melissa Allen is a 57 y.o. female with medical history significant for essential hypertension, mild intermittent asthma, who is admitted to [facility] on 06/02/2023 with acute kidney injury after presenting from home to [facility] ED complaining of dizziness .   1 week ago, the patient reports that she had several episodes of loose watery stool, in the absence of any melena or hematochezia.  The loose stool has subsequently resolved, although the patient notes diminished appetite, resulting in ensuing significant decrease in consumption of both food and water in the interval.  Subsequently, over the last 4 to 5 days, she has developed dizziness, lightheadedness, limited to periods of time in which she attempts to rise from a seated to standing position.  In this scenario, she notes that she has experienced 1 formal episode of syncope, occurring on Friday, 05/29/2023, noting that she hit her head as a component of the syncopal episode, which was not associate with any tongue biting, loss of bowel/bladder function or any report of tonic-clonic activity.  She is not on any blood thinners as an outpatient, including no aspirin.    She denies any recent chest pain, shortness of breath, nor any recent nausea/vomiting.  In the setting of prediabetes, she notes that she is on metformin as an outpatient.   She also has a history of essential hypertension, and notes the use of multiple outpatient diuretics, including triamterene as well as HCTZ.  Her outpatient hypertensive medications also include losartan.   No recent vertigo, acute focal  weakness, acute focal numbness or paresthesias.  No recent facial droop, dysphagia, or acute change in vision.   Significant Events: Admitted 06/02/2023 for AKI   Significant Labs: Admitting BUN/Scr 44/2.94  Significant Imaging Studies:   Antibiotic Therapy: Anti-infectives (From admission, onward)    None       Procedures:   Consultants:     Assessment and Plan: * AKI (acute kidney injury) (HCC) Likely due to diarrhea and use of hydrochlorothiazide/arb at home. Continue with IVF. Repeat BMP this PM at 3 pm. If improving Scr, then DC IVF. Pt would like to go home tomorrow. She says she has 30 years high school reunion this weekend she would like to attend.  High anion gap metabolic acidosis Due to GI losses and AKI. Improving.  Dehydration Due to diarrhea, hydrochlorothiazide.  Continue with IVF. Tolerating PO well. Potential to stop IVF this evening if Scr continues to improve.  Hypokalemia Replete with more po kcl. Repeat CMP in AM.  Acute diarrhea Has slowed down. No fever. No chills.  Syncope Due to dehydration. Improved.  Mild intermittent asthma Stable.  Essential hypertension Holding arb and hydrochlorothiazide for now.  GAD (generalized anxiety disorder) Stable.   DVT prophylaxis: SCDs Start: 06/02/23 2204     Code Status: Full Code Family Communication: no family at bedside Disposition Plan: return home Reason for continuing need for hospitalization: continue with IVF. Monitoring labs.  Objective: Vitals:   06/02/23 2329 06/02/23 2350 06/03/23 0359 06/03/23 0700  BP:  (!) 141/69 (!) 112/59   Pulse:  (!) 106 99 95  Resp:  17  Temp: 98.7 F (37.1 C) 99 F (37.2 C) 98.4 F (36.9 C) 98.5 F (36.9 C)  TempSrc: Oral   Oral  SpO2:  99% 100%     Intake/Output Summary (Last 24 hours) at 06/03/2023 1254 Last data filed at 06/03/2023 0000 Gross per 24 hour  Intake 2120 ml  Output --  Net 2120 ml   There were no vitals filed for this  visit.  Examination:  Physical Exam Vitals and nursing note reviewed.  Constitutional:      General: She is not in acute distress.    Appearance: Normal appearance. She is obese. She is not ill-appearing, toxic-appearing or diaphoretic.  HENT:     Head: Normocephalic and atraumatic.     Nose: Nose normal.  Eyes:     General: No scleral icterus. Cardiovascular:     Rate and Rhythm: Normal rate and regular rhythm.  Pulmonary:     Effort: Pulmonary effort is normal.     Breath sounds: Normal breath sounds.  Abdominal:     General: Bowel sounds are normal. There is no distension.     Palpations: Abdomen is soft.     Tenderness: There is no abdominal tenderness.  Musculoskeletal:     Right lower leg: No edema.     Left lower leg: No edema.  Skin:    General: Skin is warm and dry.     Capillary Refill: Capillary refill takes less than 2 seconds.  Neurological:     General: No focal deficit present.     Mental Status: She is alert and oriented to person, place, and time.     Data Reviewed: I have personally reviewed following labs and imaging studies  CBC: Recent Labs  Lab 06/02/23 1738 06/03/23 0653  WBC 9.7 6.6  NEUTROABS 6.6 3.5  HGB 14.7 11.7*  HCT 44.3 36.4  MCV 89.1 92.4  PLT 276 209   Basic Metabolic Panel: Recent Labs  Lab 06/02/23 1738 06/02/23 2024 06/03/23 0653  NA 137  --  137  K 3.0*  --  3.2*  CL 97*  --  102  CO2 19*  --  22  GLUCOSE 82  --  114*  BUN 44*  --  35*  CREATININE 2.94*  --  1.83*  CALCIUM 9.5  --  8.8*  MG  --  1.9 1.6*   GFR: CrCl cannot be calculated (Unknown ideal weight.). Liver Function Tests: Recent Labs  Lab 06/02/23 1738 06/03/23 0653  AST 26 23  ALT 25 19  ALKPHOS 81 59  BILITOT 1.6* 1.0  PROT 8.2* 6.0*  ALBUMIN 4.6 3.5   Recent Labs  Lab 06/02/23 1738  LIPASE 45    Coagulation Profile: Recent Labs  Lab 06/03/23 0653  INR 1.1   Cardiac Enzymes: Recent Labs  Lab 06/03/23 0653  CKTOTAL 73    Sepsis Labs: Recent Labs  Lab 06/02/23 2108  LATICACIDVEN 1.4    No results found for this or any previous visit (from the past 240 hour(s)).   Radiology Studies: DG Chest Port 1 View  Result Date: 06/02/2023 CLINICAL DATA:  Syncope. EXAM: PORTABLE CHEST 1 VIEW COMPARISON:  August 05, 2022. FINDINGS: The heart size and mediastinal contours are within normal limits. Both lungs are clear. The visualized skeletal structures are unremarkable. IMPRESSION: No active disease. Electronically Signed   By: Lupita Raider M.D.   On: 06/02/2023 18:45    Scheduled Meds:  DULoxetine  120 mg Oral Daily  potassium chloride  40 mEq Oral Q4H   QUEtiapine  400 mg Oral QHS   tiZANidine  6 mg Oral TID   Continuous Infusions:  lactated ringers 125 mL/hr at 06/03/23 1122     LOS: 0 days   Time spent: 40 minutes  Carollee Herter, DO  [facility]  06/03/2023, 12:54 PM

## 2023-06-03 NOTE — Progress Notes (Signed)
Mobility Specialist - Progress Note   06/03/23 1100  Mobility  Activity Ambulated with assistance in hallway  Level of Assistance Standby assist, set-up cues, supervision of patient - no hands on  Assistive Device None  Distance Ambulated (ft) 55 ft  Range of Motion/Exercises Active  Activity Response Tolerated well  Mobility Referral Yes  $Mobility charge 1 Mobility  Mobility Specialist Start Time (ACUTE ONLY) 1130  Mobility Specialist Stop Time (ACUTE ONLY) 1140  Mobility Specialist Time Calculation (min) (ACUTE ONLY) 10 min   Pt received in bed and agreed to mobility, no issues throughout session. Returned to chair with all needs met.   Marilynne Halsted Mobility Specialist

## 2023-06-03 NOTE — Assessment & Plan Note (Signed)
Holding arb and hydrochlorothiazide for now.

## 2023-06-03 NOTE — Progress Notes (Signed)
   06/03/23 1410  TOC Brief Assessment  Insurance and Status Reviewed  Patient has primary care physician Yes  Home environment has been reviewed Single family home  Prior level of function: Independent  Prior/Current Home Services No current home services  Social Determinants of Health Reivew SDOH reviewed no interventions necessary  Readmission risk has been reviewed Yes  Transition of care needs no transition of care needs at this time

## 2023-06-03 NOTE — Assessment & Plan Note (Signed)
Likely due to diarrhea and use of hydrochlorothiazide/arb at home. Continue with IVF. Repeat BMP this PM at 3 pm. If improving Scr, then DC IVF. Pt would like to go home tomorrow. She says she has 30 years high school reunion this weekend she would like to attend.

## 2023-06-03 NOTE — Assessment & Plan Note (Signed)
Due to diarrhea, hydrochlorothiazide.  Treated with with IVF. Tolerating PO well at time of discharge.

## 2023-06-03 NOTE — Plan of Care (Signed)

## 2023-06-03 NOTE — Assessment & Plan Note (Signed)
Due to dehydration. Improved.

## 2023-06-03 NOTE — Progress Notes (Signed)
   Improved Scr since this AM. Will stop IVF. Repeat BMP in AM. Possible home tomorrow.  Carollee Herter, DO [facility]

## 2023-06-03 NOTE — Subjective & Objective (Signed)
Pt seen and examined. Having 1 week of diarrhea. Has improved in last 1 day. Only 1 small episode of diarrhea early this AM. Scr has improved. Was taking hydrochlorothiazide and ARB at home. No vomiting. Urinating well now.

## 2023-06-03 NOTE — Hospital Course (Signed)
HPI: Melissa Allen is a 57 y.o. female with medical history significant for essential hypertension, mild intermittent asthma, who is admitted to [facility] on 06/02/2023 with acute kidney injury after presenting from home to [facility] ED complaining of dizziness .   1 week ago, the patient reports that she had several episodes of loose watery stool, in the absence of any melena or hematochezia.  The loose stool has subsequently resolved, although the patient notes diminished appetite, resulting in ensuing significant decrease in consumption of both food and water in the interval.  Subsequently, over the last 4 to 5 days, she has developed dizziness, lightheadedness, limited to periods of time in which she attempts to rise from a seated to standing position.  In this scenario, she notes that she has experienced 1 formal episode of syncope, occurring on Friday, 05/29/2023, noting that she hit her head as a component of the syncopal episode, which was not associate with any tongue biting, loss of bowel/bladder function or any report of tonic-clonic activity.  She is not on any blood thinners as an outpatient, including no aspirin.    She denies any recent chest pain, shortness of breath, nor any recent nausea/vomiting.  In the setting of prediabetes, she notes that she is on metformin as an outpatient.   She also has a history of essential hypertension, and notes the use of multiple outpatient diuretics, including triamterene as well as HCTZ.  Her outpatient hypertensive medications also include losartan.   No recent vertigo, acute focal weakness, acute focal numbness or paresthesias.  No recent facial droop, dysphagia, or acute change in vision.   Significant Events: Admitted 06/02/2023 for AKI   Significant Labs: Admitting BUN/Scr 44/2.94 Discharge BUN/Scr 24/0.8  Significant Imaging Studies:   Antibiotic Therapy: Anti-infectives (From admission, onward)    None        Procedures:   Consultants:

## 2023-06-03 NOTE — Assessment & Plan Note (Signed)
Due to GI losses and AKI. Improving.

## 2023-06-03 NOTE — Assessment & Plan Note (Addendum)
Repleted with kcl. Resolved.

## 2023-06-03 NOTE — Assessment & Plan Note (Signed)
Resolved. No fever. No chills.

## 2023-06-04 DIAGNOSIS — E8729 Other acidosis: Secondary | ICD-10-CM | POA: Diagnosis not present

## 2023-06-04 DIAGNOSIS — R197 Diarrhea, unspecified: Secondary | ICD-10-CM | POA: Diagnosis not present

## 2023-06-04 DIAGNOSIS — N179 Acute kidney failure, unspecified: Secondary | ICD-10-CM | POA: Diagnosis not present

## 2023-06-04 DIAGNOSIS — E860 Dehydration: Secondary | ICD-10-CM | POA: Diagnosis not present

## 2023-06-04 LAB — COMPREHENSIVE METABOLIC PANEL
ALT: 17 U/L (ref 0–44)
AST: 20 U/L (ref 15–41)
Albumin: 3.1 g/dL — ABNORMAL LOW (ref 3.5–5.0)
Alkaline Phosphatase: 50 U/L (ref 38–126)
Anion gap: 9 (ref 5–15)
BUN: 24 mg/dL — ABNORMAL HIGH (ref 6–20)
CO2: 24 mmol/L (ref 22–32)
Calcium: 9 mg/dL (ref 8.9–10.3)
Chloride: 107 mmol/L (ref 98–111)
Creatinine, Ser: 0.81 mg/dL (ref 0.44–1.00)
GFR, Estimated: >60 mL/min (ref >60)
Glucose, Bld: 91 mg/dL (ref 70–99)
Potassium: 4.2 mmol/L (ref 3.5–5.1)
Sodium: 140 mmol/L (ref 135–145)
Total Bilirubin: 0.7 mg/dL (ref 0.3–1.2)
Total Protein: 5.8 g/dL — ABNORMAL LOW (ref 6.5–8.1)

## 2023-06-04 LAB — MAGNESIUM: Magnesium: 1.3 mg/dL — ABNORMAL LOW (ref 1.7–2.4)

## 2023-06-04 MED ORDER — TIZANIDINE HCL 4 MG PO TABS: 6.0000 mg | ORAL_TABLET | Freq: Three times a day (TID) | ORAL | Status: DC | PRN

## 2023-06-04 NOTE — Plan of Care (Signed)
  Problem: Education: Goal: Knowledge of General Education information will improve Description: Including pain rating scale, medication(s)/side effects and non-pharmacologic comfort measures 06/04/2023 0501 by Precious Bard, RN Outcome: Progressing 06/04/2023 0500 by Precious Bard, RN Outcome: Progressing   Problem: Health Behavior/Discharge Planning: Goal: Ability to manage health-related needs will improve 06/04/2023 0501 by Precious Bard, RN Outcome: Progressing 06/04/2023 0500 by Precious Bard, RN Outcome: Progressing   Problem: Clinical Measurements: Goal: Ability to maintain clinical measurements within normal limits will improve 06/04/2023 0501 by Precious Bard, RN Outcome: Progressing 06/04/2023 0500 by Precious Bard, RN Outcome: Progressing Goal: Will remain free from infection 06/04/2023 0501 by Precious Bard, RN Outcome: Progressing 06/04/2023 0500 by Precious Bard, RN Outcome: Progressing Goal: Diagnostic test results will improve 06/04/2023 0501 by Precious Bard, RN Outcome: Progressing 06/04/2023 0500 by Precious Bard, RN Outcome: Progressing Goal: Respiratory complications will improve 06/04/2023 0501 by Precious Bard, RN Outcome: Progressing 06/04/2023 0500 by Precious Bard, RN Outcome: Progressing Goal: Cardiovascular complication will be avoided 06/04/2023 0501 by Precious Bard, RN Outcome: Progressing 06/04/2023 0500 by Precious Bard, RN Outcome: Progressing   Problem: Activity: Goal: Risk for activity intolerance will decrease 06/04/2023 0501 by Precious Bard, RN Outcome: Progressing 06/04/2023 0500 by Precious Bard, RN Outcome: Progressing   Problem: Nutrition: Goal: Adequate nutrition will be maintained 06/04/2023 0501 by Precious Bard, RN Outcome:  Progressing 06/04/2023 0500 by Precious Bard, RN Outcome: Progressing   Problem: Coping: Goal: Level of anxiety will decrease 06/04/2023 0501 by Precious Bard, RN Outcome: Progressing 06/04/2023 0500 by Precious Bard, RN Outcome: Progressing   Problem: Elimination: Goal: Will not experience complications related to bowel motility 06/04/2023 0501 by Precious Bard, RN Outcome: Progressing 06/04/2023 0500 by Precious Bard, RN Outcome: Progressing Goal: Will not experience complications related to urinary retention 06/04/2023 0501 by Precious Bard, RN Outcome: Progressing 06/04/2023 0500 by Precious Bard, RN Outcome: Progressing   Problem: Pain Managment: Goal: General experience of comfort will improve 06/04/2023 0501 by Precious Bard, RN Outcome: Progressing 06/04/2023 0500 by Precious Bard, RN Outcome: Progressing   Problem: Safety: Goal: Ability to remain free from injury will improve 06/04/2023 0501 by Precious Bard, RN Outcome: Progressing 06/04/2023 0500 by Precious Bard, RN Outcome: Progressing   Problem: Skin Integrity: Goal: Risk for impaired skin integrity will decrease 06/04/2023 0501 by Precious Bard, RN Outcome: Progressing 06/04/2023 0500 by Precious Bard, RN Outcome: Progressing   Problem: Education: Goal: Knowledge of General Education information will improve Description: Including pain rating scale, medication(s)/side effects and non-pharmacologic comfort measures 06/04/2023 0501 by Precious Bard, RN Outcome: Progressing 06/04/2023 0500 by Precious Bard, RN Outcome: Progressing   Problem: Health Behavior/Discharge Planning: Goal: Ability to manage health-related needs will improve 06/04/2023 0501 by Precious Bard, RN Outcome: Progressing 06/04/2023 0500 by Precious Bard, RN Outcome: Progressing

## 2023-06-04 NOTE — Discharge Summary (Addendum)
[facility] Phys[facility] Discharge Summary   Patient name: Melissa Allen  Admit date:     06/02/2023  Discharge date: 06/04/2023  Attending Physician: Imogene Burn, Theressa Piedra [[id number]]  Discharge Physician: Carollee Herter   PCP: Dois Davenport, MD  Admitted From: Home   Disposition:  Home  Recommendations for Outpatient Follow-up:  Follow up with PCP in 1 week to see if ARB/hydrochlorothiazide can be restarted Hold ARB/hydrochlorothiazide due to low BP Will need repeat BMP if ARB and/or hydrochlorothiazide is restarted  Home Health:No Equipment/Devices: None    Discharge Condition:Stable CODE STATUS:FULL Diet recommendation: Diabetic Fluid Restriction: None  Hospital Summary: HPI: Melissa Allen is a 57 y.o. female with medical history significant for essential hypertension, mild intermittent asthma, who is admitted to [facility] on 06/02/2023 with acute kidney injury after presenting from home to [facility] ED complaining of dizziness .   1 week ago, the patient reports that she had several episodes of loose watery stool, in the absence of any melena or hematochezia.  The loose stool has subsequently resolved, although the patient notes diminished appetite, resulting in ensuing significant decrease in consumption of both food and water in the interval.  Subsequently, over the last 4 to 5 days, she has developed dizziness, lightheadedness, limited to periods of time in which she attempts to rise from a seated to standing position.  In this scenario, she notes that she has experienced 1 formal episode of syncope, occurring on Friday, 05/29/2023, noting that she hit her head as a component of the syncopal episode, which was not associate with any tongue biting, loss of bowel/bladder function or any report of tonic-clonic activity.  She is not on any blood thinners as an outpatient, including no aspirin.    She denies any recent chest pain, shortness of breath, nor any recent nausea/vomiting.  In  the setting of prediabetes, she notes that she is on metformin as an outpatient.   She also has a history of essential hypertension, and notes the use of multiple outpatient diuretics, including triamterene as well as HCTZ.  Her outpatient hypertensive medications also include losartan.   No recent vertigo, acute focal weakness, acute focal numbness or paresthesias.  No recent facial droop, dysphagia, or acute change in vision.   Significant Events: Admitted 06/02/2023 for AKI   Significant Labs: Admitting BUN/Scr 44/2.94 Discharge BUN/Scr 24/0.8  Significant Imaging Studies:   Antibiotic Therapy: Anti-infectives (From admission, onward)    None       Procedures:   Consultants:    Hospital Course by Problem: * AKI (acute kidney injury) (HCC) Likely due to diarrhea and use of hydrochlorothiazide/arb at home.  Pt treated with IVF. Her ARB/hydrochlorothiazide were held.  BUN/Scr improved with IVF hydration. Discharge BUN/Scr 24/0.8  High anion gap metabolic acidosis Due to GI losses and AKI.  Resolved with IVF  Dehydration Due to diarrhea, hydrochlorothiazide.  Treated with with IVF. Tolerating PO well at time of discharge.   Hypokalemia Repleted with kcl. Resolved.  Acute diarrhea Resolved. No fever. No chills.  Syncope Due to dehydration. Improved.  Mild intermittent asthma Stable.  Essential hypertension Holding arb and hydrochlorothiazide during hospitalization. Will continue to hold ARB/hydrochlorothiazide at time of discharge due to borderline/low BP. Pt will walk hospital hallways prior to discharge to ensure she does not have orthostasis. Pt to f/u with PCP next week for BP check. And to restart HTN meds if indicated.  Pt will need BMP if ARB/hydrochlorothiazide is restarted.  Discussed with pt  that if she has diarrhea, poor po intake, signs of dehydration in the future, she should hold her ARB and hydrochlorothiazide.  GAD (generalized anxiety  disorder) Stable.    Discharge Diagnoses:  Principal Problem:   AKI (acute kidney injury) (HCC) Active Problems:   Syncope   Acute diarrhea   Hypokalemia   Dehydration   High anion gap metabolic acidosis   GAD (generalized anxiety disorder)   Essential hypertension   Mild intermittent asthma   Discharge Instructions  Discharge Instructions     Call MD for:  difficulty breathing, headache or visual disturbances   Complete by: As directed    Call MD for:  persistant dizziness or light-headedness   Complete by: As directed    Call MD for:  persistant nausea and vomiting   Complete by: As directed    Call MD for:  redness, tenderness, or signs of infection (pain, swelling, redness, odor or green/yellow discharge around incision site)   Complete by: As directed    Call MD for:  severe uncontrolled pain   Complete by: As directed    Call MD for:  temperature >100.4   Complete by: As directed    Diet - low sodium heart healthy   Complete by: As directed    Discharge instructions   Complete by: As directed    1. Stop taking losartan and hydrochlorothiazide(hctz) 2. Followup with your primary care provider provider within 1 week following discharge to see if you losartan and/or hydrochlorothiazide can be restarted. 3. Make sure that repeat kidney function panel is drawn after restarting either losartan and/or hydrochlorothiazide   Increase activity slowly   Complete by: As directed       Allergies as of 06/04/2023       Reactions   Chlorquinaldol Rash   11/30/19 patch testing: positive reaction to Quinoline Mix (contains clioquinol and chlorquinaldol)   Clioquinol Rash   11/30/19 patch testing: positive reaction to Quinoline Mix (contains clioquinol and chlorquinaldol)   Other Anaphylaxis   Pt has anaphylactic reaction to something during procedure - unsure of what  Per patient, Quinoline Mix  - based on allergy testing   Latex Itching, Swelling, Dermatitis   SWELLING  FROM FACE MASK RING AFTER LAST SURGERY   Lisinopril Cough      Codeine Nausea And Vomiting   Hydrocodone Itching   Tape Rash   TOLERATES PAPER TAPE ONLY NO PINK SURGICAL TAPE EITHER, EKG Leads    Wound Dressing Adhesive Rash   Dermabond        Medication List     STOP taking these medications    HYDROcodone-acetaminophen 10-325 MG tablet Commonly known as: NORCO   losartan 50 MG tablet Commonly known as: COZAAR   triamterene-hydrochlorothiazide 37.5-25 MG tablet Commonly known as: MAXZIDE-25       TAKE these medications    albuterol 108 (90 Base) MCG/ACT inhaler Commonly known as: ProAir HFA INHALE TWO PUFFS EVERY FOUR HOURS AS NEEDED SHORTNESS OF BREATH   Auvelity 45-105 MG Tbcr Generic drug: Dextromethorphan-buPROPion ER Take 1 tablet by mouth in the morning and at bedtime.   DULoxetine 60 MG capsule Commonly known as: CYMBALTA Take 120 mg by mouth daily.   EPINEPHrine 0.3 mg/0.3 mL Soaj injection Commonly known as: EPI-PEN Inject 0.3 mLs (0.3 mg total) into the muscle as needed for anaphylaxis.   estradiol 1 MG tablet Commonly known as: ESTRACE TAKE 1 TABLET BY MOUTH ONCE A DAY   metFORMIN 500 MG 24 hr tablet Commonly  known as: GLUCOPHAGE-XR Take 500 mg by mouth 2 (two) times daily.   multivitamin with minerals Tabs tablet Take 1 tablet by mouth daily.   oxyCODONE-acetaminophen 5-325 MG tablet Commonly known as: Percocet Take 1 tablet by mouth 4 times a day   QUEtiapine 400 MG tablet Commonly known as: SEROQUEL Take 400 mg by mouth at bedtime.   Quviviq 50 MG Tabs Generic drug: Daridorexant HCl Take 1 tablet by mouth daily.   tiZANidine 4 MG tablet Commonly known as: Zanaflex Take 1 and 1/2 tablets by mouth 3 times a day as needed What changed: Another medication with the same name was removed. Continue taking this medication, and follow the directions you see here.   zolpidem 10 MG tablet Commonly known as: AMBIEN Take 10 mg by mouth  at bedtime.        Allergies  Allergen Reactions   Chlorquinaldol Rash    11/30/19 patch testing: positive reaction to Quinoline Mix (contains clioquinol and chlorquinaldol)   Clioquinol Rash    11/30/19 patch testing: positive reaction to Quinoline Mix (contains clioquinol and chlorquinaldol)    Other Anaphylaxis    Pt has anaphylactic reaction to something during procedure - unsure of what   Per patient, Quinoline Mix  - based on allergy testing   Latex Itching, Swelling and Dermatitis    SWELLING FROM FACE MASK RING AFTER LAST SURGERY    Lisinopril Cough        Codeine Nausea And Vomiting   Hydrocodone Itching   Tape Rash    TOLERATES PAPER TAPE ONLY NO PINK SURGICAL TAPE EITHER, EKG Leads    Wound Dressing Adhesive Rash    Dermabond    Discharge Exam: Vitals:   06/03/23 2030 06/04/23 0534  BP: 113/67 (!) 95/54  Pulse: 87 78  Resp: 16 16  Temp: 98.5 F (36.9 C) 97.9 F (36.6 C)  SpO2: 98% 95%    Physical Exam Vitals and nursing note reviewed.  Constitutional:      General: She is not in acute distress.    Appearance: She is not ill-appearing, toxic-appearing or diaphoretic.  HENT:     Head: Normocephalic and atraumatic.  Eyes:     General: No scleral icterus. Cardiovascular:     Rate and Rhythm: Normal rate and regular rhythm.  Pulmonary:     Effort: Pulmonary effort is normal.  Abdominal:     General: Bowel sounds are normal. There is no distension.     Palpations: Abdomen is soft.     Tenderness: There is no abdominal tenderness.  Musculoskeletal:     Right lower leg: No edema.     Left lower leg: No edema.  Skin:    General: Skin is warm and dry.     Capillary Refill: Capillary refill takes less than 2 seconds.  Neurological:     General: No focal deficit present.     Mental Status: She is alert and oriented to person, place, and time.     The results of significant diagnostics from this hospitalization (including imaging, microbiology,  ancillary and laboratory) are listed below for reference.    Microbiology: No results found for this or any previous visit (from the past 240 hour(s)).   Labs: BNP (last 3 results) No results for input(s): "BNP" in the last 8760 hours. Basic Metabolic Panel: Recent Labs  Lab 06/02/23 1738 06/02/23 2024 06/03/23 0653 06/03/23 1500 06/04/23 0511  NA 137  --  137 137 140  K 3.0*  --  3.2* 3.4* 4.2  CL 97*  --  102 102 107  CO2 19*  --  22 21* 24  GLUCOSE 82  --  114* 120* 91  BUN 44*  --  35* 28* 24*  CREATININE 2.94*  --  1.83* 1.42* 0.81  CALCIUM 9.5  --  8.8* 9.1 9.0  MG  --  1.9 1.6*  --  1.3*   Liver Function Tests: Recent Labs  Lab 06/02/23 1738 06/03/23 0653 06/04/23 0511  AST 26 23 20   ALT 25 19 17   ALKPHOS 81 59 50  BILITOT 1.6* 1.0 0.7  PROT 8.2* 6.0* 5.8*  ALBUMIN 4.6 3.5 3.1*   Recent Labs  Lab 06/02/23 1738  LIPASE 45   No results for input(s): "AMMONIA" in the last 168 hours. CBC: Recent Labs  Lab 06/02/23 1738 06/03/23 0653  WBC 9.7 6.6  NEUTROABS 6.6 3.5  HGB 14.7 11.7*  HCT 44.3 36.4  MCV 89.1 92.4  PLT 276 209   Cardiac Enzymes: Recent Labs  Lab 06/03/23 0653  CKTOTAL 73   Urinalysis    Component Value Date/Time   COLORURINE YELLOW 06/02/2023 2220   APPEARANCEUR CLEAR 06/02/2023 2220   LABSPEC 1.013 06/02/2023 2220   PHURINE 5.0 06/02/2023 2220   GLUCOSEU NEGATIVE 06/02/2023 2220   GLUCOSEU NEGATIVE 12/27/2019 1127   HGBUR NEGATIVE 06/02/2023 2220   BILIRUBINUR NEGATIVE 06/02/2023 2220   BILIRUBINUR neg 09/09/2017 1642   KETONESUR 20 (A) 06/02/2023 2220   PROTEINUR NEGATIVE 06/02/2023 2220   UROBILINOGEN 0.2 12/27/2019 1127   NITRITE NEGATIVE 06/02/2023 2220   LEUKOCYTESUR NEGATIVE 06/02/2023 2220   Sepsis Labs Recent Labs  Lab 06/02/23 1738 06/03/23 0653  WBC 9.7 6.6    Procedures/Studies: DG Chest Port 1 View  Result Date: 06/02/2023 CLINICAL DATA:  Syncope. EXAM: PORTABLE CHEST 1 VIEW COMPARISON:  August 05, 2022. FINDINGS: The heart size and mediastinal contours are within normal limits. Both lungs are clear. The visualized skeletal structures are unremarkable. IMPRESSION: No active disease. Electronically Signed   By: Lupita Raider M.D.   On: 06/02/2023 18:45    Time coordinating discharge: 35 mins  SIGNED:  Carollee Herter, DO [facility] 06/04/23, 9:52 AM

## 2023-06-10 ENCOUNTER — Other Ambulatory Visit: Payer: Self-pay | Admitting: Family Medicine

## 2023-06-10 DIAGNOSIS — R109 Unspecified abdominal pain: Secondary | ICD-10-CM

## 2023-06-18 ENCOUNTER — Other Ambulatory Visit: Payer: BC Managed Care – PPO

## 2023-07-14 ENCOUNTER — Encounter: Payer: Self-pay | Admitting: Physician Assistant

## 2023-07-15 ENCOUNTER — Other Ambulatory Visit: Payer: Self-pay | Admitting: Family Medicine

## 2023-07-15 DIAGNOSIS — E2839 Other primary ovarian failure: Secondary | ICD-10-CM

## 2023-07-17 ENCOUNTER — Encounter (HOSPITAL_COMMUNITY): Payer: Self-pay

## 2023-07-17 ENCOUNTER — Other Ambulatory Visit: Payer: Self-pay

## 2023-07-17 ENCOUNTER — Inpatient Hospital Stay (HOSPITAL_COMMUNITY)
Admission: EM | Admit: 2023-07-17 | Discharge: 2023-07-21 | DRG: 683 | Disposition: A | Discharge status: Home / Self Care | Payer: BC Managed Care – PPO | Attending: Family Medicine | Admitting: Family Medicine

## 2023-07-17 DIAGNOSIS — Z820 Family history of epilepsy and other diseases of the nervous system: Secondary | ICD-10-CM

## 2023-07-17 DIAGNOSIS — K529 Noninfective gastroenteritis and colitis, unspecified: Secondary | ICD-10-CM | POA: Diagnosis present

## 2023-07-17 DIAGNOSIS — Z8249 Family history of ischemic heart disease and other diseases of the circulatory system: Secondary | ICD-10-CM

## 2023-07-17 DIAGNOSIS — N179 Acute kidney failure, unspecified: Principal | ICD-10-CM | POA: Diagnosis present

## 2023-07-17 DIAGNOSIS — Z981 Arthrodesis status: Secondary | ICD-10-CM

## 2023-07-17 DIAGNOSIS — Z888 Allergy status to other drugs, medicaments and biological substances status: Secondary | ICD-10-CM

## 2023-07-17 DIAGNOSIS — J45909 Unspecified asthma, uncomplicated: Secondary | ICD-10-CM | POA: Diagnosis present

## 2023-07-17 DIAGNOSIS — K219 Gastro-esophageal reflux disease without esophagitis: Secondary | ICD-10-CM | POA: Diagnosis present

## 2023-07-17 DIAGNOSIS — K862 Cyst of pancreas: Secondary | ICD-10-CM | POA: Diagnosis present

## 2023-07-17 DIAGNOSIS — Z79899 Other long term (current) drug therapy: Secondary | ICD-10-CM

## 2023-07-17 DIAGNOSIS — Z8049 Family history of malignant neoplasm of other genital organs: Secondary | ICD-10-CM

## 2023-07-17 DIAGNOSIS — Z885 Allergy status to narcotic agent status: Secondary | ICD-10-CM

## 2023-07-17 DIAGNOSIS — R197 Diarrhea, unspecified: Secondary | ICD-10-CM | POA: Diagnosis not present

## 2023-07-17 DIAGNOSIS — K2970 Gastritis, unspecified, without bleeding: Secondary | ICD-10-CM | POA: Diagnosis present

## 2023-07-17 DIAGNOSIS — G8929 Other chronic pain: Secondary | ICD-10-CM | POA: Diagnosis present

## 2023-07-17 DIAGNOSIS — E876 Hypokalemia: Secondary | ICD-10-CM | POA: Diagnosis present

## 2023-07-17 DIAGNOSIS — M797 Fibromyalgia: Secondary | ICD-10-CM | POA: Diagnosis present

## 2023-07-17 DIAGNOSIS — Z96652 Presence of left artificial knee joint: Secondary | ICD-10-CM | POA: Diagnosis present

## 2023-07-17 DIAGNOSIS — E669 Obesity, unspecified: Secondary | ICD-10-CM | POA: Diagnosis present

## 2023-07-17 DIAGNOSIS — Z853 Personal history of malignant neoplasm of breast: Secondary | ICD-10-CM

## 2023-07-17 DIAGNOSIS — Z803 Family history of malignant neoplasm of breast: Secondary | ICD-10-CM

## 2023-07-17 DIAGNOSIS — Z96611 Presence of right artificial shoulder joint: Secondary | ICD-10-CM | POA: Diagnosis present

## 2023-07-17 DIAGNOSIS — Z83719 Family history of colon polyps, unspecified: Secondary | ICD-10-CM

## 2023-07-17 DIAGNOSIS — E860 Dehydration: Secondary | ICD-10-CM | POA: Diagnosis not present

## 2023-07-17 DIAGNOSIS — Z91040 Latex allergy status: Secondary | ICD-10-CM

## 2023-07-17 DIAGNOSIS — I10 Essential (primary) hypertension: Secondary | ICD-10-CM | POA: Diagnosis present

## 2023-07-17 DIAGNOSIS — Z860100 Personal history of colon polyps, unspecified: Secondary | ICD-10-CM

## 2023-07-17 DIAGNOSIS — Z800 Family history of malignant neoplasm of digestive organs: Secondary | ICD-10-CM

## 2023-07-17 DIAGNOSIS — R1084 Generalized abdominal pain: Secondary | ICD-10-CM

## 2023-07-17 DIAGNOSIS — Z6829 Body mass index (BMI) 29.0-29.9, adult: Secondary | ICD-10-CM

## 2023-07-17 DIAGNOSIS — R109 Unspecified abdominal pain: Secondary | ICD-10-CM | POA: Diagnosis present

## 2023-07-17 DIAGNOSIS — Z806 Family history of leukemia: Secondary | ICD-10-CM

## 2023-07-17 DIAGNOSIS — Z7984 Long term (current) use of oral hypoglycemic drugs: Secondary | ICD-10-CM

## 2023-07-17 DIAGNOSIS — Z91048 Other nonmedicinal substance allergy status: Secondary | ICD-10-CM

## 2023-07-17 DIAGNOSIS — Z833 Family history of diabetes mellitus: Secondary | ICD-10-CM

## 2023-07-17 DIAGNOSIS — Z9013 Acquired absence of bilateral breasts and nipples: Secondary | ICD-10-CM

## 2023-07-17 DIAGNOSIS — Z79818 Long term (current) use of other agents affecting estrogen receptors and estrogen levels: Secondary | ICD-10-CM

## 2023-07-17 DIAGNOSIS — N170 Acute kidney failure with tubular necrosis: Principal | ICD-10-CM | POA: Diagnosis present

## 2023-07-17 DIAGNOSIS — Z8041 Family history of malignant neoplasm of ovary: Secondary | ICD-10-CM

## 2023-07-17 DIAGNOSIS — R7303 Prediabetes: Secondary | ICD-10-CM | POA: Diagnosis present

## 2023-07-17 DIAGNOSIS — I951 Orthostatic hypotension: Secondary | ICD-10-CM | POA: Insufficient documentation

## 2023-07-17 LAB — COMPREHENSIVE METABOLIC PANEL
ALT: 17 U/L (ref 0–44)
AST: 23 U/L (ref 15–41)
Albumin: 4.1 g/dL (ref 3.5–5.0)
Alkaline Phosphatase: 69 U/L (ref 38–126)
Anion gap: 14 (ref 5–15)
BUN: 22 mg/dL — ABNORMAL HIGH (ref 6–20)
CO2: 21 mmol/L — ABNORMAL LOW (ref 22–32)
Calcium: 9.3 mg/dL (ref 8.9–10.3)
Chloride: 102 mmol/L (ref 98–111)
Creatinine, Ser: 2.26 mg/dL — ABNORMAL HIGH (ref 0.44–1.00)
GFR, Estimated: 25 mL/min — ABNORMAL LOW (ref >60)
Glucose, Bld: 96 mg/dL (ref 70–99)
Potassium: 3 mmol/L — ABNORMAL LOW (ref 3.5–5.1)
Sodium: 137 mmol/L (ref 135–145)
Total Bilirubin: 0.7 mg/dL (ref <1.2)
Total Protein: 7.3 g/dL (ref 6.5–8.1)

## 2023-07-17 LAB — URINALYSIS, W/ REFLEX TO CULTURE (INFECTION SUSPECTED)
Bilirubin Urine: NEGATIVE
Glucose, UA: NEGATIVE mg/dL
Hgb urine dipstick: NEGATIVE
Ketones, ur: NEGATIVE mg/dL
Leukocytes,Ua: NEGATIVE
Nitrite: NEGATIVE
Protein, ur: NEGATIVE mg/dL
Specific Gravity, Urine: 1.012 (ref 1.005–1.030)
pH: 5 (ref 5.0–8.0)

## 2023-07-17 LAB — CBC WITH DIFFERENTIAL/PLATELET
Abs Immature Granulocytes: 0.06 10*3/uL (ref 0.00–0.07)
Basophils Absolute: 0.1 10*3/uL (ref 0.0–0.1)
Basophils Relative: 1 %
Eosinophils Absolute: 0.3 10*3/uL (ref 0.0–0.5)
Eosinophils Relative: 3 %
HCT: 41.3 % (ref 36.0–46.0)
Hemoglobin: 13.6 g/dL (ref 12.0–15.0)
Immature Granulocytes: 1 %
Lymphocytes Relative: 28 %
Lymphs Abs: 2.5 10*3/uL (ref 0.7–4.0)
MCH: 30.2 pg (ref 26.0–34.0)
MCHC: 32.9 g/dL (ref 30.0–36.0)
MCV: 91.6 fL (ref 80.0–100.0)
Monocytes Absolute: 0.8 10*3/uL (ref 0.1–1.0)
Monocytes Relative: 9 %
Neutro Abs: 5.3 10*3/uL (ref 1.7–7.7)
Neutrophils Relative %: 58 %
Platelets: 294 10*3/uL (ref 150–400)
RBC: 4.51 MIL/uL (ref 3.87–5.11)
RDW: 13.4 % (ref 11.5–15.5)
WBC: 9 10*3/uL (ref 4.0–10.5)
nRBC: 0 % (ref 0.0–0.2)

## 2023-07-17 LAB — HEMOGLOBIN A1C
Hgb A1c MFr Bld: 5.4 % (ref 4.8–5.6)
Mean Plasma Glucose: 108.28 mg/dL

## 2023-07-17 LAB — CBG MONITORING, ED: Glucose-Capillary: 86 mg/dL (ref 70–99)

## 2023-07-17 LAB — GLUCOSE, CAPILLARY
Glucose-Capillary: 111 mg/dL — ABNORMAL HIGH (ref 70–99)
Glucose-Capillary: 126 mg/dL — ABNORMAL HIGH (ref 70–99)

## 2023-07-17 LAB — LIPASE, BLOOD: Lipase: 35 U/L (ref 11–51)

## 2023-07-17 LAB — HIV ANTIBODY (ROUTINE TESTING W REFLEX): HIV Screen 4th Generation wRfx: NONREACTIVE

## 2023-07-17 MED ORDER — SUCRALFATE 1 G PO TABS
1.0000 g | ORAL_TABLET | Freq: Three times a day (TID) | ORAL | Status: DC
  Administered 2023-07-17 – 2023-07-21 (×15): 1 g via ORAL
  Filled 2023-07-17 (×15): qty 1

## 2023-07-17 MED ORDER — LOPERAMIDE HCL 2 MG PO CAPS: 4.0000 mg | ORAL_CAPSULE | ORAL | Status: DC | PRN

## 2023-07-17 MED ORDER — POTASSIUM CHLORIDE 10 MEQ/100ML IV SOLN
10.0000 meq | Freq: Once | INTRAVENOUS | Status: AC
  Administered 2023-07-17: 10 meq via INTRAVENOUS
  Filled 2023-07-17: qty 100

## 2023-07-17 MED ORDER — TIZANIDINE HCL 4 MG PO TABS
4.0000 mg | ORAL_TABLET | Freq: Four times a day (QID) | ORAL | Status: DC | PRN
  Administered 2023-07-18 – 2023-07-20 (×4): 4 mg via ORAL
  Filled 2023-07-17 (×5): qty 1

## 2023-07-17 MED ORDER — PANTOPRAZOLE SODIUM 40 MG PO TBEC
40.0000 mg | DELAYED_RELEASE_TABLET | Freq: Every day | ORAL | Status: DC
  Administered 2023-07-18 – 2023-07-20 (×3): 40 mg via ORAL
  Filled 2023-07-17 (×3): qty 1

## 2023-07-17 MED ORDER — ACETAMINOPHEN 650 MG RE SUPP: 650.0000 mg | Freq: Four times a day (QID) | RECTAL | Status: DC | PRN

## 2023-07-17 MED ORDER — INSULIN ASPART 100 UNIT/ML IJ SOLN
0.0000 [IU] | Freq: Three times a day (TID) | INTRAMUSCULAR | Status: DC
  Filled 2023-07-17: qty 0.15

## 2023-07-17 MED ORDER — OXYCODONE-ACETAMINOPHEN 5-325 MG PO TABS
1.0000 | ORAL_TABLET | ORAL | Status: DC | PRN
  Administered 2023-07-17 – 2023-07-20 (×7): 1 via ORAL
  Filled 2023-07-17 (×7): qty 1

## 2023-07-17 MED ORDER — ONDANSETRON HCL 4 MG PO TABS: 4.0000 mg | ORAL_TABLET | Freq: Four times a day (QID) | ORAL | Status: DC | PRN

## 2023-07-17 MED ORDER — ENSURE ENLIVE PO LIQD
237.0000 mL | Freq: Two times a day (BID) | ORAL | Status: DC
  Administered 2023-07-17: 237 mL via ORAL

## 2023-07-17 MED ORDER — CARVEDILOL 3.125 MG PO TABS
3.1250 mg | ORAL_TABLET | Freq: Two times a day (BID) | ORAL | Status: DC
  Administered 2023-07-17: 3.125 mg via ORAL
  Filled 2023-07-17 (×2): qty 1

## 2023-07-17 MED ORDER — SODIUM CHLORIDE 0.9 % IV SOLN: INTRAVENOUS | Status: AC

## 2023-07-17 MED ORDER — DULOXETINE HCL 30 MG PO CPEP
120.0000 mg | ORAL_CAPSULE | Freq: Every day | ORAL | Status: DC
  Administered 2023-07-17 – 2023-07-21 (×5): 120 mg via ORAL
  Filled 2023-07-17 (×5): qty 4

## 2023-07-17 MED ORDER — POTASSIUM CHLORIDE CRYS ER 20 MEQ PO TBCR
40.0000 meq | EXTENDED_RELEASE_TABLET | Freq: Once | ORAL | Status: AC
  Administered 2023-07-17: 40 meq via ORAL
  Filled 2023-07-17: qty 2

## 2023-07-17 MED ORDER — INSULIN ASPART 100 UNIT/ML IJ SOLN
0.0000 [IU] | Freq: Every day | INTRAMUSCULAR | Status: DC
  Filled 2023-07-17: qty 0.05

## 2023-07-17 MED ORDER — HEPARIN SODIUM (PORCINE) 5000 UNIT/ML IJ SOLN
5000.0000 [IU] | Freq: Three times a day (TID) | INTRAMUSCULAR | Status: DC
  Administered 2023-07-17 – 2023-07-21 (×12): 5000 [IU] via SUBCUTANEOUS
  Filled 2023-07-17 (×12): qty 1

## 2023-07-17 MED ORDER — ONDANSETRON HCL 4 MG/2ML IJ SOLN
4.0000 mg | Freq: Four times a day (QID) | INTRAMUSCULAR | Status: DC | PRN
  Administered 2023-07-17 – 2023-07-19 (×5): 4 mg via INTRAVENOUS
  Filled 2023-07-17 (×5): qty 2

## 2023-07-17 MED ORDER — ZOLPIDEM TARTRATE 5 MG PO TABS
10.0000 mg | ORAL_TABLET | Freq: Every day | ORAL | Status: DC
  Administered 2023-07-17 – 2023-07-20 (×4): 10 mg via ORAL
  Filled 2023-07-17 (×4): qty 2

## 2023-07-17 MED ORDER — DEXTROMETHORPHAN-BUPROPION ER 45-105 MG PO TBCR: 1.0000 | EXTENDED_RELEASE_TABLET | Freq: Two times a day (BID) | ORAL | Status: DC

## 2023-07-17 MED ORDER — ALUM & MAG HYDROXIDE-SIMETH 200-200-20 MG/5ML PO SUSP
15.0000 mL | Freq: Four times a day (QID) | ORAL | Status: DC | PRN
  Administered 2023-07-18 – 2023-07-20 (×4): 15 mL via ORAL
  Filled 2023-07-17 (×4): qty 30

## 2023-07-17 MED ORDER — ALBUTEROL SULFATE (2.5 MG/3ML) 0.083% IN NEBU: 2.5000 mg | INHALATION_SOLUTION | RESPIRATORY_TRACT | Status: DC | PRN

## 2023-07-17 MED ORDER — DOXEPIN HCL 25 MG PO CAPS
25.0000 mg | ORAL_CAPSULE | Freq: Every day | ORAL | Status: DC
  Administered 2023-07-17 – 2023-07-20 (×4): 25 mg via ORAL
  Filled 2023-07-17 (×4): qty 1

## 2023-07-17 MED ORDER — PANTOPRAZOLE SODIUM 40 MG IV SOLR
40.0000 mg | INTRAVENOUS | Status: DC
  Administered 2023-07-17: 40 mg via INTRAVENOUS
  Filled 2023-07-17: qty 10

## 2023-07-17 MED ORDER — ORAL CARE MOUTH RINSE: 15.0000 mL | OROMUCOSAL | Status: DC | PRN

## 2023-07-17 MED ORDER — QUETIAPINE FUMARATE 100 MG PO TABS
400.0000 mg | ORAL_TABLET | Freq: Every day | ORAL | Status: DC
  Administered 2023-07-17 – 2023-07-20 (×4): 400 mg via ORAL
  Filled 2023-07-17 (×4): qty 4

## 2023-07-17 MED ORDER — LACTATED RINGERS IV BOLUS
1000.0000 mL | Freq: Once | INTRAVENOUS | Status: AC
  Administered 2023-07-17: 1000 mL via INTRAVENOUS

## 2023-07-17 MED ORDER — HYDROXYZINE HCL 25 MG PO TABS
25.0000 mg | ORAL_TABLET | Freq: Once | ORAL | Status: AC
  Administered 2023-07-17: 25 mg via ORAL
  Filled 2023-07-17: qty 1

## 2023-07-17 MED ORDER — ACETAMINOPHEN 325 MG PO TABS
650.0000 mg | ORAL_TABLET | Freq: Four times a day (QID) | ORAL | Status: DC | PRN
  Administered 2023-07-18 – 2023-07-20 (×2): 650 mg via ORAL
  Filled 2023-07-17 (×2): qty 2

## 2023-07-17 MED ORDER — ONDANSETRON HCL 4 MG/2ML IJ SOLN
4.0000 mg | Freq: Once | INTRAMUSCULAR | Status: AC
  Administered 2023-07-17: 4 mg via INTRAVENOUS
  Filled 2023-07-17: qty 2

## 2023-07-17 NOTE — ED Triage Notes (Signed)
Pt reports off and on diarrhea since September. Taking immodium daily. This morning fall and hit head on computer table. No lac, no LOC, no HA or blurry vision. Hospitalized September for same. Endorses dizziness

## 2023-07-17 NOTE — H&P (Addendum)
History and Physical  Melissa Allen:[id number] DOB: Sep 14, 1965 DOA: 07/17/2023  PCP: Dois Davenport, MD   Chief Complaint: Diarrhea, fall at home  HPI: Melissa Allen is a 57 y.o. female with medical history significant for treated breast cancer, obesity, hypertension, nephrolithiasis, chronic diarrhea being admitted to the hospital with AKI and hypokalemia due to dehydration.  She has had abdominal cramping and watery diarrhea with meals since September.  Denies any fevers, has severe nausea but no vomiting.  She was hospitalized in September with acute kidney injury and dizziness, her hydrochlorothiazide was discontinued during that hospital stay, and she has been off of it since that time.  She has not been eating or drinking very much since September, as any p.o. intake causes abdominal cramping and diarrhea.  Denies any blood in her stool.  She had breast reconstructive surgery back in August, and was on a course of Bactrim postoperatively, but otherwise no antibiotics.  No sick contacts.  She is followed by [facility] gastroenterology, but could not get an appointment with them until February.  Early this morning, she woke up from sleep feeling like she urgently had to have a bowel movement, got up quickly to go to the bathroom, got lightheaded, fell and hit her head on the corner of her computer table.  No loss of consciousness.  Top of her head feels slightly sore, but denies any neurologic symptoms.  Review of Systems: Please see HPI for pertinent positives and negatives. A complete 10 system review of systems are otherwise negative.  Past Medical History:  Diagnosis Date   Allergy    Anginal pain (HCC)    one episode 4-5 years ago-[facility] [place]-nonspecific-no problems since-   Anxiety    Arthritis    multiple areas of arthritis- DDD   Asthma    recent admit to ER 01/11/12 for exac of asthma   Calcaneal fracture    Cancer (HCC)    Duplicated ureter, right    Family  history of breast cancer    Family history of colon cancer    Family history of ovarian cancer    Family history of pancreatic cancer    Fibromyalgia    History of kidney stones    Hypertension    Obesity (BMI 30.0-34.9)    Pancreatitis 1986   Pneumonia    PONV (postoperative nausea and vomiting)    Poor venous access    hard to start iv-   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, was treated /w phenobarbital for a while, then taken off - 02/24/2020 occurred 30 years ago per patient   Shortness of breath dyspnea    Wears glasses    Past Surgical History:  Procedure Laterality Date   ABDOMINAL HYSTERECTOMY     ANTERIOR LAT LUMBAR FUSION Left 08/17/2018   Procedure: LUMBAR THREE-FOUR ANTERIOR LATERAL INTERBODY FUSION;  Surgeon: Julio Sicks, MD;  Location: [facility];  Service: Neurosurgery;  Laterality: Left;   APPENDECTOMY  08/05/2017   BLADDER SUSPENSION  03/01/2012   Procedure: TRANSVAGINAL TAPE (TVT) PROCEDURE;  Surgeon: Allie Bossier, MD;  Location: [facility];  Service: Gynecology;  Laterality: N/A;   BREAST CAPSULECTOMY WITH IMPLANT EXCHANGE Bilateral 04/10/2023   Procedure: BREAST CAPSULORRHAPHY WITH SILICONE IMPLANT EXCHANGE;  Surgeon: Glenna Fellows, MD;  Location: [facility];  Service: Plastics;  Laterality: Bilateral;   BREAST RECONSTRUCTION WITH PLACEMENT OF TISSUE EXPANDER AND ALLODERM Bilateral 10/19/2020   Procedure: BILATERAL BREAST RECONSTRUCTION WITH PLACEMENT OF  TISSUE EXPANDER AND ALLODERM;  Surgeon: Glenna Fellows, MD;  Location: [facility];  Service: Plastics;  Laterality: Bilateral;   BUNIONECTOMY  1995   Right   CHOLECYSTECTOMY  late 1980's       CYSTOSCOPY  03/01/2012   Procedure: CYSTOSCOPY;  Surgeon: Allie Bossier, MD;  Location: [facility];  Service: Gynecology;  Laterality: N/A;   CYSTOSCOPY W/ URETERAL STENT PLACEMENT Left 12/15/2014   Procedure: CYSTOSCOPY, LEFT URETEROSCOPYU WITH STONE EXTRACTION;  Surgeon: Bjorn Pippin, MD;  Location:  [facility];  Service: Urology;  Laterality: Left;   CYSTOSCOPY W/ URETERAL STENT PLACEMENT Left 10/02/2016   Procedure: CYSTOSCOPY WITH RETROGRADE PYELOGRAM/URETERAL STENT PLACEMENT ureteroscopy, basket extraction;  Surgeon: Malen Gauze, MD;  Location: [facility];  Service: Urology;  Laterality: Left;   HARDWARE REMOVAL N/A 12/22/2016   Procedure: Lumbar Four-Five Removal of Hardware;  Surgeon: Julio Sicks, MD;  Location: [facility] OR;  Service: Neurosurgery;  Laterality: N/A;   INCISION AND DRAINAGE ABSCESS Left 07/19/2013   Procedure: INCISION AND DRAINAGE LEFT LOWER EXTERMITY HEMATOMA;  Surgeon: Wilmon Arms. Corliss Skains, MD;  Location: [facility];  Service: General;  Laterality: Left;  Excision left lower leg mass   KNEE CARTILAGE SURGERY     left   KNEE SURGERY Left    x 2, Murphy/Sue, corrected Patella displacement    LAMINECTOMY     LAPAROSCOPIC APPENDECTOMY N/A 08/05/2017   Procedure: APPENDECTOMY LAPAROSCOPIC;  Surgeon: Rodman Pickle, MD;  Location: [facility];  Service: General;  Laterality: N/A;   LIPOSUCTION Bilateral 04/10/2023   Procedure: BILATERAL CHEST WALLS LIPOSUCTION;  Surgeon: Glenna Fellows, MD;  Location: [facility];  Service: Plastics;  Laterality: Bilateral;   LUMBAR LAMINECTOMY  06/2005   L4-5, Ray   LUMBAR PERCUTANEOUS PEDICLE SCREW 1 LEVEL Left 08/17/2018   Procedure: LUMBAR THREE-FOUR LUMBAR PERCUTANEOUS PEDICLE SCREW;  Surgeon: Julio Sicks, MD;  [facility];  Service: Neurosurgery;  Laterality: Left;   MASTECTOMY W/ SENTINEL NODE BIOPSY Bilateral 10/19/2020   Procedure: BILATERAL MASTECTOMY WITH LEFT SENTINEL LYMPH NODE BIOPSY;  Surgeon: Griselda Miner, MD;  Location: [facility];  Service: General;  Laterality: Bilateral;   POSTERIOR CERVICAL LAMINECTOMY Right 02/28/2020   Procedure: Right Cervical Six-Seven Laminectomy and Foraminotomy;  Surgeon: Julio Sicks, MD;  Location: [facility] OR;  Service: Neurosurgery;  Laterality: Right;  3C    R compressed pronator Right    Sypher, R arm   REMOVAL OF BILATERAL TISSUE EXPANDERS WITH PLACEMENT OF BILATERAL BREAST IMPLANTS Bilateral 01/28/2021   Procedure: REMOVAL OF BILATERAL TISSUE EXPANDERS WITH PLACEMENT OF BILATERAL BREAST IMPLANTS WITH ACELLULAR DERMIS;  Surgeon: Glenna Fellows, MD;  Location: [facility];  Service: Plastics;  Laterality: Bilateral;   RESECTION DISTAL CLAVICAL Right 06/30/2017   Procedure: RIGHT RESECTION DISTAL CLAVICAL;  Surgeon: Marcene Corning, MD;  [facility]: [facility];  Service: Orthopedics;  Laterality: Right;   SHOULDER ACROMIOPLASTY Right 06/30/2017   Procedure: RIGHT SHOULDER ACROMIOPLASTY;  Surgeon: Marcene Corning, MD;  Location: [facility];  Service: Orthopedics;  Laterality: Right;   SHOULDER ARTHROSCOPY Right 06/30/2017   Procedure: ARTHROSCOPY DEBRIDEMENT RIGHT SHOULDER;  Surgeon: Marcene Corning, MD;  Location: [facility];  Service: Orthopedics;  Laterality: Right;   SPINAL FUSION     TENDON REPAIR  2013   TENDON REPAIR Right 02/01/2012   TONSILLECTOMY  age 89   TOTAL KNEE ARTHROPLASTY Left 08/12/2022   Procedure: LEFT TOTAL KNEE ARTHROPLASTY;  Surgeon: Marcene Corning, MD;  Location: [facility];  Service: Orthopedics;  Laterality: Left;   TOTAL SHOULDER ARTHROPLASTY Right 08/22/2021   Procedure: TOTAL SHOULDER ARTHROPLASTY;  Surgeon: Jones Broom, MD;  Location: [facility];  Service: Orthopedics;  Laterality: Right;    Social History:  reports that she has never smoked. She has never used smokeless tobacco. She reports that she does not currently use alcohol. She reports that she does not use drugs.   Allergies  Allergen Reactions   Chlorquinaldol Rash    11/30/19 patch testing: positive reaction to Quinoline Mix (contains clioquinol and chlorquinaldol)   Clioquinol Rash    11/30/19 patch testing: positive reaction to Quinoline Mix (contains clioquinol and chlorquinaldol)    Other Anaphylaxis    Pt has anaphylactic reaction to something  during procedure - unsure of what   Per patient, Quinoline Mix  - based on allergy testing   Latex Itching, Swelling and Dermatitis    SWELLING FROM FACE MASK RING AFTER LAST SURGERY    Lisinopril Cough        Codeine Nausea And Vomiting   Hydrocodone Itching   Tape Rash    TOLERATES PAPER TAPE ONLY NO PINK SURGICAL TAPE EITHER, EKG Leads    Wound Dressing Adhesive Rash    Dermabond    Family History  Problem Relation Age of Onset   Cancer Mother        pancreatic cancer   Heart disease Father 85   Colon polyps Father    Leukemia Father    Dementia Father    Breast cancer Maternal Grandmother    Ovarian cancer Maternal Grandmother 31   Colon cancer Paternal Grandfather        dx < 50   Uterine cancer Other        Grandmother   Breast cancer Other        Grandmother   Ovarian cancer Other        Grandmother   Diabetes Other        mother   Other Neg Hx      Prior to Admission medications   Medication Sig Start Date End Date Taking? Authorizing Provider  albuterol [facility] HFA) 108 (90 Base) MCG/ACT inhaler INHALE TWO PUFFS EVERY FOUR HOURS AS NEEDED SHORTNESS OF BREATH 08/27/16   Corwin Levins, MD  Dextromethorphan-buPROPion ER (AUVELITY) 45-105 MG TBCR Take 1 tablet by mouth in the morning and at bedtime.    [provider]  DULoxetine (CYMBALTA) 60 MG capsule Take 120 mg by mouth daily. 05/22/23   [provider]  EPINEPHrine 0.3 mg/0.3 mL IJ SOAJ injection Inject 0.3 mLs (0.3 mg total) into the muscle as needed for anaphylaxis. 10/04/19   Everrett Coombe, DO  estradiol (ESTRACE) 1 MG tablet TAKE 1 TABLET BY MOUTH ONCE A DAY 07/21/21   Reva Bores, MD  metFORMIN (GLUCOPHAGE-XR) 500 MG 24 hr tablet Take 500 mg by mouth 2 (two) times daily.    [provider]  Multiple Vitamin (MULTIVITAMIN WITH MINERALS) TABS tablet Take 1 tablet by mouth daily.    [provider]  oxyCODONE-acetaminophen (PERCOCET) 5-325 MG tablet Take 1 tablet by  mouth 4 times a day Patient not taking: Reported on 06/02/2023 11/06/22     QUEtiapine (SEROQUEL) 400 MG tablet Take 400 mg by mouth at bedtime.    [provider]  QUVIVIQ 50 MG TABS Take 1 tablet by mouth daily. Patient not taking: Reported on 06/02/2023    [provider]  tiZANidine (ZANAFLEX) 4 MG tablet Take 1  and 1/2 tablets by mouth 3 times a day as needed 11/06/22     zolpidem (AMBIEN) 10 MG tablet Take 10 mg by mouth at bedtime.    [provider]    Physical Exam: BP (!) 96/58   Pulse 95   Temp 98.8 F (37.1 C) (Oral)   Resp 12   Ht 5\' 4"  (1.626 m)   Wt 77.1 kg   LMP 12/07/2011   SpO2 97%   BMI 29.18 kg/m   General:  Alert, oriented, calm, in no acute distress, pleasant and cooperative Eyes: EOMI, clear conjuctivae, white sclerea Neck: supple, no masses, trachea mildline  Cardiovascular: RRR, no murmurs or rubs, no peripheral edema  Respiratory: clear to auscultation bilaterally, no wheezes, no crackles  Abdomen: Soft, slightly distended, diffusely tender, with some voluntary guarding Skin: dry, no rashes  Musculoskeletal: no joint effusions, normal range of motion  Psychiatric: appropriate affect, normal speech  Neurologic: extraocular muscles intact, clear speech, moving all extremities with intact sensorium         Labs on Admission:  Basic Metabolic Panel: Recent Labs  Lab 07/17/23 0823  NA 137  K 3.0*  CL 102  CO2 21*  GLUCOSE 96  BUN 22*  CREATININE 2.26*  CALCIUM 9.3   Liver Function Tests: Recent Labs  Lab 07/17/23 0823  AST 23  ALT 17  ALKPHOS 69  BILITOT 0.7  PROT 7.3  ALBUMIN 4.1   Recent Labs  Lab 07/17/23 0823  LIPASE 35   No results for input(s): "AMMONIA" in the last 168 hours. CBC: Recent Labs  Lab 07/17/23 0823  WBC 9.0  NEUTROABS 5.3  HGB 13.6  HCT 41.3  MCV 91.6  PLT 294   Cardiac Enzymes: No results for input(s): "CKTOTAL", "CKMB", "CKMBINDEX", "TROPONINI" in the last 168 hours.  BNP  (last 3 results) No results for input(s): "BNP" in the last 8760 hours.  ProBNP (last 3 results) No results for input(s): "PROBNP" in the last 8760 hours.  CBG: No results for input(s): "GLUCAP" in the last 168 hours.  Radiological Exams on Admission: No results found.  Assessment/Plan Melissa Allen is a 57 y.o. female with medical history significant for treated breast cancer, obesity, hypertension, nephrolithiasis, chronic diarrhea being admitted to the hospital with AKI and hypokalemia due to dehydration.  Acute kidney injury-in the setting of baseline normal renal function, likely ATN due to dehydration in the setting of chronic diarrhea of unclear cause. -Observation admission -IV normal saline infusion -Avoid nephrotoxins and hypotension -Renally dose medications as appropriate -Follow renal function with daily labs  Chronic diarrhea-unclear etiology, on review of [place] records, had screening colonoscopy and upper endoscopy January 2021.  Colonoscopy was revealed diminutive colon polyps which were adenomatous.  Upper endoscopy with mild esophagitis.  She had a course of oral antibiotics in August. -Enteric precautions -Check stool culture, and C. Difficile -Imodium as needed -Given severity of symptoms causing multiple hospital admissions, will request inpatient GI evaluation  Hypokalemia-due to GI losses, replete orally and IV.  Recheck with morning labs.  Gastritis-IV PPI  Non-insulin-dependent type 2 diabetes-hold home metformin, carb controlled diet, check hemoglobin A1c, with moderate dose sliding scale  DVT prophylaxis: Heparin subcu    Code Status: Full Code  Consults called: [facility]  Admission status: Observation  Time spent: 59 minutes  Lugenia Assefa Sharlette Dense MD [facility] Pager [phone]  If 7PM-7AM, please contact night-coverage [email or web address] Password [facility]  07/17/2023, 9:47 AM

## 2023-07-17 NOTE — Consult Note (Addendum)
Consultation  Referring Provider:   [facility] Primary Care Physician:  Dois Davenport, MD Primary Gastroenterologist:  Dr. Marina Goodell       Reason for Consultation:   Diarrhea with AKI DOA: 07/17/2023         Hospital Day: 1         HPI:   Melissa Allen is a 57 y.o. female with past medical history significant for breast cancer, obesity, hypertension, nephrolithiasis, pancreatic cyst, personal history of adenomatous polyps, GERD, status post cholecystectomy.  Recent admission 9/24 through 9/26 with diarrhea, AKI, hypotension.  Admitting BUN at that time 44/2.94 and discharge BUN/creatinine 24 and 0.8. Patient's blood pressure medication was held,and potassium repleted Suppose to follow up with our office.   Patient presented again to the ER today with AKI, diarrhea, poor oral intake, syncope within her head.  Work up notable for  Pending C. difficile and GI pathogen panel CBC without anemia or leukocytosis Potassium 3, BUN 22, creatinine 2.26 baseline 12 and 0.86 Negative CPK and lipase Normal liver function  09/28/2019 EGD and colonoscopy.  Colonoscopy revealed diminutive colon polyps which were adenomatous no other abnormality.  EGD showed mild esophagitis otherwise unremarkable.   Recall colonoscopy 09/27/2022 09/19/2022 MRI abdomen with without contrast for pancreatic cyst showed stable 17 mm cystic lesion in head of pancreas with thin internal septation and probable main pancreatic ductal communication without suspicious MRI features.  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 Patient states since at least beginning of September she has been having diarrhea 2-3 times daily loose stools can be oily and yellow at times with mucus, can wake her up from her sleep with abdominal cramping, urgency.  She has had 2 episodes of  fecal incontinence.  Denies hematochezia or melena. She does have associated nausea but no vomiting.  Has some right upper quadrant discomfort.   Patient's had some decreased appetite, weight loss of 1314 pounds per patient. Has some chills primarily hands and feet has had some hypotension, relates it to that.  Denies any fevers. Patient does have a headache at this time denies any shortness of breath, chest pain. Denies any sick contacts.  No recent travel. Patient states she has not had any antibiotics or changes in her medications since June. In June her Cymbalta was decreased 120 mg daily with the addition of Dexameth orphan with bupropion which she takes 4 times a day and Seroquel 400 mg at night, equaling 400 mg bupropion a day.  She is also on oxycodone for chronic pain. Father died of pancreatic cancer.  Abnormal ED labs: Abnormal Labs Reviewed  COMPREHENSIVE METABOLIC PANEL - Abnormal; Notable for the following components:      Result Value   Potassium 3.0 (*)    CO2 21 (*)    BUN 22 (*)    Creatinine, Ser 2.26 (*)    GFR, Estimated 25 (*)    All other components within normal limits  URINALYSIS, W/ REFLEX TO CULTURE (INFECTION SUSPECTED) - Abnormal; Notable for the following components:   APPearance CLOUDY (*)    Bacteria, UA MANY (*)    All other components within normal limits    Past Medical History:  Diagnosis Date   Allergy    Anginal pain (HCC)    one episode 4-5 years ago-[facility] [place]-nonspecific-no  problems since-   Anxiety    Arthritis    multiple areas of arthritis- DDD   Asthma    recent admit to ER 01/11/12 for exac of asthma   Calcaneal fracture    Cancer (HCC)    Duplicated ureter, right    Family history of breast cancer    Family history of colon cancer    Family history of ovarian cancer    Family history of pancreatic cancer    Fibromyalgia    History of kidney stones    Hypertension    Obesity (BMI 30.0-34.9)    Pancreatitis  1986   Pneumonia    PONV (postoperative nausea and vomiting)    Poor venous access    hard to start iv-   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, was treated /w phenobarbital for a while, then taken off - 02/24/2020 occurred 30 years ago per patient   Shortness of breath dyspnea    Wears glasses     Surgical History:  She  has a past surgical history that includes Tonsillectomy (age 26); Bunionectomy (1995); Cholecystectomy (late 1980's); Lumbar laminectomy (06/2005); Knee surgery (Left); R compressed pronator (Right); Tendon repair (2013); Tendon repair (Right, 02/01/2012); Bladder suspension (03/01/2012); Cystoscopy (03/01/2012); Abdominal hysterectomy; Incision and drainage abscess (Left, 07/19/2013); Cystoscopy w/ ureteral stent placement (Left, 12/15/2014); Spinal fusion; Laminectomy; Cystoscopy w/ ureteral stent placement (Left, 10/02/2016); Hardware Removal (N/A, 12/22/2016); Knee cartilage surgery; Shoulder arthroscopy (Right, 06/30/2017); Resection distal clavical (Right, 06/30/2017); Shoulder acromioplasty (Right, 06/30/2017); laparoscopic appendectomy (N/A, 08/05/2017); Appendectomy (08/05/2017); Anterior lat lumbar fusion (Left, 08/17/2018); Lumbar percutaneous pedicle screw 1 level (Left, 08/17/2018); Posterior cervical laminectomy (Right, 02/28/2020); Mastectomy w/ sentinel node biopsy (Bilateral, 10/19/2020); Breast Reconstruction with placement of Tissue Expander and Alloderm (Bilateral, 10/19/2020); Removal of bilateral tissue expanders with placement of bilateral breast implants (Bilateral, 01/28/2021); Total shoulder arthroplasty (Right, 08/22/2021); Total knee arthroplasty (Left, 08/12/2022); Breast capsulectomy with implant exchange (Bilateral, 04/10/2023); and Liposuction (Bilateral, 04/10/2023). Family History:  Her family history includes Breast cancer in her maternal grandmother and another family member; Cancer in her mother; Colon cancer in her paternal grandfather; Colon polyps  in her father; Dementia in her father; Diabetes in an other family member; Heart disease (age of onset: 54) in her father; Leukemia in her father; Ovarian cancer in an other family member; Ovarian cancer (age of onset: 45) in her maternal grandmother; Uterine cancer in an other family member. Social History:   reports that she has never smoked. She has never used smokeless tobacco. She reports that she does not currently use alcohol. She reports that she does not use drugs.  Prior to Admission medications   Medication Sig Start Date End Date Taking? Authorizing Provider  albuterol [facility] HFA) 108 (90 Base) MCG/ACT inhaler INHALE TWO PUFFS EVERY FOUR HOURS AS NEEDED SHORTNESS OF BREATH 08/27/16   Corwin Levins, MD  Dextromethorphan-buPROPion ER (AUVELITY) 45-105 MG TBCR Take 1 tablet by mouth in the morning and at bedtime.    [provider]  DULoxetine (CYMBALTA) 60 MG capsule Take 120 mg by mouth daily. 05/22/23   [provider]  EPINEPHrine 0.3 mg/0.3 mL IJ SOAJ injection Inject 0.3 mLs (0.3 mg total) into the muscle as needed for anaphylaxis. 10/04/19   Everrett Coombe, DO  estradiol (ESTRACE) 1 MG tablet TAKE 1 TABLET BY MOUTH ONCE A DAY 07/21/21   Reva Bores, MD  metFORMIN (GLUCOPHAGE-XR) 500 MG 24 hr tablet Take 500 mg by mouth 2 (two) times  daily.    [provider]  Multiple Vitamin (MULTIVITAMIN WITH MINERALS) TABS tablet Take 1 tablet by mouth daily.    [provider]  oxyCODONE-acetaminophen (PERCOCET) 5-325 MG tablet Take 1 tablet by mouth 4 times a day Patient not taking: Reported on 06/02/2023 11/06/22     QUEtiapine (SEROQUEL) 400 MG tablet Take 400 mg by mouth at bedtime.    [provider]  QUVIVIQ 50 MG TABS Take 1 tablet by mouth daily. Patient not taking: Reported on 06/02/2023    [provider]  tiZANidine (ZANAFLEX) 4 MG tablet Take 1 and 1/2 tablets by mouth 3 times a day as needed 11/06/22     zolpidem (AMBIEN) 10 MG  tablet Take 10 mg by mouth at bedtime.    [provider]    Current Facility-Administered Medications  Medication Dose Route Frequency Provider Last Rate Last Admin   0.9 %  sodium chloride infusion   Intravenous Continuous Kirby Crigler, Mir M, MD 100 mL/hr at 07/17/23 1038 New Bag at 07/17/23 1038   acetaminophen (TYLENOL) tablet 650 mg  650 mg Oral Q6H PRN Kirby Crigler, Mir M, MD       Or   acetaminophen (TYLENOL) suppository 650 mg  650 mg Rectal Q6H PRN Kirby Crigler, Mir M, MD       albuterol (PROVENTIL) (2.5 MG/3ML) 0.083% nebulizer solution 2.5 mg  2.5 mg Nebulization Q2H PRN Kirby Crigler, Mir M, MD       heparin injection 5,000 Units  5,000 Units Subcutaneous Q8H Kirby Crigler, Mir M, MD       insulin aspart (novoLOG) injection 0-15 Units  0-15 Units Subcutaneous TID WC Kirby Crigler, Mir M, MD       insulin aspart (novoLOG) injection 0-5 Units  0-5 Units Subcutaneous QHS Kirby Crigler, Mir M, MD       loperamide (IMODIUM) capsule 4 mg  4 mg Oral PRN Kirby Crigler, Mir M, MD       ondansetron [facility]) tablet 4 mg  4 mg Oral Q6H PRN Kirby Crigler, Mir M, MD       Or   ondansetron [facility]) injection 4 mg  4 mg Intravenous Q6H PRN Kirby Crigler, Mir M, MD       [START ON 07/18/2023] pantoprazole (PROTONIX) EC tablet 40 mg  40 mg Oral Daily Robertson, Crystal S, RPH       QUEtiapine (SEROQUEL) tablet 400 mg  400 mg Oral QHS Kirby Crigler, Mir M, MD       tiZANidine (ZANAFLEX) tablet 4 mg  4 mg Oral Q6H PRN Kirby Crigler, Mir M, MD       Current Outpatient Medications  Medication Sig Dispense Refill   albuterol (PROAIR HFA) 108 (90 Base) MCG/ACT inhaler INHALE TWO PUFFS EVERY FOUR HOURS AS NEEDED SHORTNESS OF BREATH 8.5 g 2   Dextromethorphan-buPROPion ER (AUVELITY) 45-105 MG TBCR Take 1 tablet by mouth in the morning and at bedtime.     DULoxetine (CYMBALTA) 60 MG capsule Take 120 mg by mouth daily.     EPINEPHrine 0.3 mg/0.3 mL IJ SOAJ injection Inject 0.3 mLs (0.3 mg total) into the muscle as needed for  anaphylaxis. 1 each 1   estradiol (ESTRACE) 1 MG tablet TAKE 1 TABLET BY MOUTH ONCE A DAY 90 tablet 3   metFORMIN (GLUCOPHAGE-XR) 500 MG 24 hr tablet Take 500 mg by mouth 2 (two) times daily.     Multiple Vitamin (MULTIVITAMIN WITH MINERALS) TABS tablet Take 1 tablet by mouth daily.     oxyCODONE-acetaminophen (PERCOCET) 5-325 MG tablet Take  1 tablet by mouth 4 times a day (Patient not taking: Reported on 06/02/2023) 120 tablet 0   QUEtiapine (SEROQUEL) 400 MG tablet Take 400 mg by mouth at bedtime.     QUVIVIQ 50 MG TABS Take 1 tablet by mouth daily. (Patient not taking: Reported on 06/02/2023)     tiZANidine (ZANAFLEX) 4 MG tablet Take 1 and 1/2 tablets by mouth 3 times a day as needed 135 tablet 2   zolpidem (AMBIEN) 10 MG tablet Take 10 mg by mouth at bedtime.      Allergies as of 07/17/2023 - Review Complete 07/17/2023  Allergen Reaction Noted   Chlorquinaldol Rash 02/27/2020   Clioquinol Rash 02/27/2020   Other Anaphylaxis 02/17/2020   Latex Itching, Swelling, and Dermatitis 01/11/2012   Lisinopril Cough 12/07/2012   Codeine Nausea And Vomiting    Hydrocodone Itching    Tape Rash 04/30/2015   Wound dressing adhesive Rash 05/27/2021    Review of Systems:    Constitutional: No weight loss, fever, chills, weakness or fatigue HEENT: Eyes: No change in vision               Ears, Nose, Throat:  No change in hearing or congestion Skin: No rash or itching Cardiovascular: No chest pain, chest pressure or palpitations   Respiratory: No SOB or cough Gastrointestinal: See HPI and otherwise negative Genitourinary: No dysuria or change in urinary frequency Neurological: No headache, dizziness or syncope Musculoskeletal: No new muscle or joint pain Hematologic: No bleeding or bruising Psychiatric: No history of depression or anxiety     Physical Exam:  Vital signs in last 24 hours: Temp:  [98.8 F (37.1 C)] 98.8 F (37.1 C) (11/08 0757) Pulse Rate:  [93-138] 94 (11/08 1130) Resp:   [12-18] 12 (11/08 1130) BP: (91-111)/(55-78) 111/55 (11/08 1130) SpO2:  [95 %-100 %] 95 % (11/08 1130) Weight:  [77.1 kg] 77.1 kg (11/08 0803)   Last BM recorded by nurses in past 5 days No data recorded  General:   Pleasant, well developed female in no acute distress Head:  Normocephalic and atraumatic. Eyes: sclerae anicteric,conjunctive pink  Heart:  regular rate and rhythm, no murmurs or gallops Pulm: Clear anteriorly; no wheezing Abdomen:  Soft, Obese AB, Active bowel sounds. Mild to moderate tenderness in the RUQ. Without guarding and Without rebound, No organomegaly appreciated. Extremities:  Without edema. Msk:  Symmetrical without gross deformities. Peripheral pulses intact.  Neurologic:  Alert and  oriented x4;  No focal deficits.  Skin:   Dry and intact without significant lesions or rashes. Psychiatric:  Cooperative. Normal mood and affect.  LAB RESULTS: Recent Labs    07/17/23 0823  WBC 9.0  HGB 13.6  HCT 41.3  PLT 294   BMET Recent Labs    07/17/23 0823  NA 137  K 3.0*  CL 102  CO2 21*  GLUCOSE 96  BUN 22*  CREATININE 2.26*  CALCIUM 9.3   LFT Recent Labs    07/17/23 0823  PROT 7.3  ALBUMIN 4.1  AST 23  ALT 17  ALKPHOS 69  BILITOT 0.7   PT/INR No results for input(s): "LABPROT", "INR" in the last 72 hours.  STUDIES: No results found.    Impression    Diarrhea since September with associated abdominal pain, urgency, nausea, fecal incontinence, weight loss, decreased p.o. intake resulting in syncopal episode and AKI. Some suspicion for infection with upper GI symptoms, urgency and acuity Patient was due for colonoscopy January of this year Recent increase in  serotonin medications everything is negative consider potential serotonin syndrome with tachycardia and symptoms  AKI BUN 22 Cr 2.26  GFR 25 baseline GFR 0.8 Potassium 3.0  Magnesium 1.3  Secondary to decreased p.o. intake and diarrhea Per primary  Pancreatic cyst 17 mm cystic  lesion pancreatic head internal septation probable main pancreatic duct communication without suspicious features last imaged 09/19/2022 recall 1 year  Principal Problem:   AKI (acute kidney injury) (HCC)    LOS: 0 days     Plan   Rehydrate and supportive care at this time Soft diet, clear liquid diet tomorrow Dicyclomine as needed for AB pain Pending GI pathogen and C. Difficile If infectious panel negative plan for colonoscopy with biopsies this admission to rule out microscopic colitis Consider pancreatic elastase for EPI Repeat MRI abdomen in January of this year for pancreatic cyst likely IPMN Further recommendations per Dr. Marina Goodell  Thank you for your kind consultation, we will continue to follow.   Doree Albee  07/17/2023, 12:40 PM  GI ATTENDING  History, laboratories, x-rays reviewed.  Patient, well-known to me, seen and examined in conjunction with the GI physician assistant.  Patient with persistent diarrheal illness as noted above.  Dehydration.  Agree with workup including GI pathogen panel and C. difficile.  If workup negative, will need colonoscopy with biopsies to rule out microscopic colitis.  Will follow.  Wilhemina Bonito. Eda Keys., M.D. [facility] Division of Gastroenterology

## 2023-07-17 NOTE — Plan of Care (Signed)
  Problem: Education: Goal: Ability to describe self-care measures that may prevent or decrease complications (Diabetes Survival Skills Education) will improve Outcome: Progressing Goal: Individualized Educational Video(s) Outcome: Progressing   Problem: Coping: Goal: Ability to adjust to condition or change in health will improve Outcome: Progressing   Problem: Fluid Volume: Goal: Ability to maintain a balanced intake and output will improve Outcome: Progressing   Problem: Health Behavior/Discharge Planning: Goal: Ability to identify and utilize available resources and services will improve Outcome: Progressing Goal: Ability to manage health-related needs will improve Outcome: Progressing   Problem: Metabolic: Goal: Ability to maintain appropriate glucose levels will improve Outcome: Progressing   Problem: Nutritional: Goal: Maintenance of adequate nutrition will improve Outcome: Progressing Goal: Progress toward achieving an optimal weight will improve Outcome: Progressing   Problem: Skin Integrity: Goal: Risk for impaired skin integrity will decrease Outcome: Progressing   Problem: Tissue Perfusion: Goal: Adequacy of tissue perfusion will improve Outcome: Progressing   Problem: Education: Goal: Knowledge of General Education information will improve Description: Including pain rating scale, medication(s)/side effects and non-pharmacologic comfort measures Outcome: Progressing   Problem: Health Behavior/Discharge Planning: Goal: Ability to manage health-related needs will improve Outcome: Progressing   Problem: Clinical Measurements: Goal: Ability to maintain clinical measurements within normal limits will improve Outcome: Progressing Goal: Will remain free from infection Outcome: Progressing Goal: Diagnostic test results will improve Outcome: Progressing Goal: Respiratory complications will improve Outcome: Progressing Goal: Cardiovascular complication will  be avoided Outcome: Progressing   Problem: Activity: Goal: Risk for activity intolerance will decrease Outcome: Progressing   Problem: Nutrition: Goal: Adequate nutrition will be maintained Outcome: Progressing   Problem: Elimination: Goal: Will not experience complications related to urinary retention Outcome: Progressing   Problem: Safety: Goal: Ability to remain free from injury will improve Outcome: Progressing   Problem: Skin Integrity: Goal: Risk for impaired skin integrity will decrease Outcome: Progressing

## 2023-07-17 NOTE — ED Provider Notes (Signed)
[place] EMERGENCY DEPARTMENT AT [facility] Provider Note   CSN: [id number] Arrival date & time: 07/17/23  0753     History  Chief Complaint  Patient presents with   Near Syncope   Fall    Melissa Allen is a 57 y.o. female with past medical history significant for hypertension, asthma, anxiety, breast cancer s/p double mastectomy presents to the ED after a fall this morning where she hit her head on a computer table.  Patient reports that she has had on and off diarrhea since September.  This morning, she went from sitting to standing, felt lightheaded, which caused her to fall.  She denies headache or loss of consciousness after hitting her head.  Patient feels nauseated, but has not had vomiting.  She has had poor PO intake due to decreased appetite and stomach discomfort.  Her stool has changed to a yellow, oily appearance.  Denies fever, chills, visual disturbance.        Home Medications Prior to Admission medications   Medication Sig Start Date End Date Taking? Authorizing Provider  albuterol [facility] HFA) 108 (90 Base) MCG/ACT inhaler INHALE TWO PUFFS EVERY FOUR HOURS AS NEEDED SHORTNESS OF BREATH 08/27/16   Corwin Levins, MD  Dextromethorphan-buPROPion ER (AUVELITY) 45-105 MG TBCR Take 1 tablet by mouth in the morning and at bedtime.    [provider]  DULoxetine (CYMBALTA) 60 MG capsule Take 120 mg by mouth daily. 05/22/23   [provider]  EPINEPHrine 0.3 mg/0.3 mL IJ SOAJ injection Inject 0.3 mLs (0.3 mg total) into the muscle as needed for anaphylaxis. 10/04/19   Everrett Coombe, DO  estradiol (ESTRACE) 1 MG tablet TAKE 1 TABLET BY MOUTH ONCE A DAY 07/21/21   Reva Bores, MD  metFORMIN (GLUCOPHAGE-XR) 500 MG 24 hr tablet Take 500 mg by mouth 2 (two) times daily.    [provider]  Multiple Vitamin (MULTIVITAMIN WITH MINERALS) TABS tablet Take 1 tablet by mouth daily.    [provider]  oxyCODONE-acetaminophen (PERCOCET)  5-325 MG tablet Take 1 tablet by mouth 4 times a day Patient not taking: Reported on 06/02/2023 11/06/22     QUEtiapine (SEROQUEL) 400 MG tablet Take 400 mg by mouth at bedtime.    [provider]  QUVIVIQ 50 MG TABS Take 1 tablet by mouth daily. Patient not taking: Reported on 06/02/2023    [provider]  tiZANidine (ZANAFLEX) 4 MG tablet Take 1 and 1/2 tablets by mouth 3 times a day as needed 11/06/22     zolpidem (AMBIEN) 10 MG tablet Take 10 mg by mouth at bedtime.    [provider]      Allergies    Chlorquinaldol, Clioquinol, Other, Latex, Lisinopril, Codeine, Hydrocodone, Tape, and Wound dressing adhesive    Review of Systems   Review of Systems  Constitutional:  Negative for chills and fever.  Eyes:  Negative for visual disturbance.  Gastrointestinal:  Positive for abdominal pain, diarrhea and nausea. Negative for vomiting.  Neurological:  Positive for light-headedness. Negative for dizziness and syncope.    Physical Exam Updated Vital Signs BP (!) 96/58   Pulse 95   Temp 98.8 F (37.1 C) (Oral)   Resp 12   Ht 5\' 4"  (1.626 m)   Wt 77.1 kg   LMP 12/07/2011   SpO2 97%   BMI 29.18 kg/m  Physical Exam Vitals and nursing note reviewed.  Constitutional:      General: She is not in  acute distress.    Appearance: Normal appearance. She is ill-appearing. She is not diaphoretic.  HENT:     Head: Normocephalic and atraumatic.     Mouth/Throat:     Lips: Pink.     Mouth: Mucous membranes are dry.     Pharynx: Oropharynx is clear.  Cardiovascular:     Rate and Rhythm: Regular rhythm. Tachycardia present.     Heart sounds: Normal heart sounds.  Pulmonary:     Effort: Pulmonary effort is normal. No tachypnea or respiratory distress.     Breath sounds: Normal breath sounds and air entry.  Abdominal:     General: Abdomen is flat.     Palpations: Abdomen is soft.     Tenderness: There is generalized abdominal tenderness.  Skin:    General: Skin  is warm and dry.     Capillary Refill: Capillary refill takes less than 2 seconds.     Coloration: Skin is pale.  Neurological:     Mental Status: She is alert. Mental status is at baseline.  Psychiatric:        Mood and Affect: Mood normal.        Behavior: Behavior normal.     ED Results / Procedures / Treatments   Labs (all labs ordered are listed, but only abnormal results are displayed) Labs Reviewed  COMPREHENSIVE METABOLIC PANEL - Abnormal; Notable for the following components:      Result Value   Potassium 3.0 (*)    CO2 21 (*)    BUN 22 (*)    Creatinine, Ser 2.26 (*)    GFR, Estimated 25 (*)    All other components within normal limits  CBC WITH DIFFERENTIAL/PLATELET  LIPASE, BLOOD  URINALYSIS, W/ REFLEX TO CULTURE (INFECTION SUSPECTED)    EKG EKG Interpretation Date/Time:  Friday July 17 2023 08:14:34 EST Ventricular Rate:  123 PR Interval:  149 QRS Duration:  94 QT Interval:  291 QTC Calculation: 417 R Axis:   103  Text Interpretation: Sinus tachycardia Right axis deviation Low voltage, extremity and precordial leads Nonspecific T abnormalities, lateral leads New since previous tracing Confirmed by Vanetta Mulders [phone]) on 07/17/2023 8:45:02 AM  Radiology No results found.  Procedures .Critical Care  Performed by: Lenard Simmer, PA-C Authorized by: Lenard Simmer, PA-C   Critical care provider statement:    Critical care time (minutes):  36   Critical care time was exclusive of:  Separately billable procedures and treating other patients and teaching time   Critical care was necessary to treat or prevent imminent or life-threatening deterioration of the following conditions:  Shock   Critical care was time spent personally by me on the following activities:  Development of treatment plan with patient or surrogate, discussions with consultants, evaluation of patient's response to treatment, examination of patient, obtaining history from patient  or surrogate, ordering and performing treatments and interventions, ordering and review of laboratory studies, pulse oximetry, re-evaluation of patient's condition and review of old charts   I assumed direction of critical care for this patient from another provider in my specialty: no     Care discussed with: admitting provider       Medications Ordered in ED Medications  potassium chloride 10 mEq in 100 mL IVPB (10 mEq Intravenous New Bag/Given 07/17/23 0928)  lactated ringers bolus 1,000 mL (1,000 mLs Intravenous New Bag/Given 07/17/23 0845)  lactated ringers bolus 1,000 mL (1,000 mLs Intravenous New Bag/Given 07/17/23 0850)  ondansetron (ZOFRAN) injection 4 mg (  4 mg Intravenous Given 07/17/23 6295)    ED Course/ Medical Decision Making/ A&P                                 Medical Decision Making Amount and/or Complexity of Data Reviewed Labs: ordered.   This patient presents to the ED with chief complaint(s) of near syncope, diarrhea, dehydration with pertinent past medical history of hypertension, breast cancer.  The complaint involves an extensive differential diagnosis and also carries with it a high risk of complications and morbidity.    The differential diagnosis includes dehydration, metabolic derangement, pancreatitis, colitis   The initial plan is to obtain labs, give fluid bolus  Additional history obtained: Records reviewed previous admission documents  Initial Assessment:   Exam significant for ill-appearing, pale patient who is not in acute distress.  She does appear uncomfortable.  Abdomen is soft with generalized tenderness.  Mucous membranes are dry.  Heart rate is tachycardic in the 120s with regular rhythm.  BP at time of exam was hypotensive with SBP in the 70s.  Independent ECG/labs interpretation:  The following labs were independently interpreted:  ECG with sinus tachycardia. Metabolic panel significant for elevated Cr at 2.26.  GFR down to 25.  Patient was  0.81 one month prior.  Mild hypokalemia, K 3.0.  CBC without leukocytosis or anemia.  Lipase normal.  Treatment and Reassessment: Patient given 2L bolus of LR with improvement in HR and BP.  BP remains soft.  I feel that patient would benefit from hospital admission for AKI and rehydration.  Consultations obtained:   Spoke with on-call [facility], Dr. Erenest Blank, who agrees with hospital admission.  Disposition:   Patient to be admitted to [facility].           Final Clinical Impression(s) / ED Diagnoses Final diagnoses:  AKI (acute kidney injury) (HCC)  Dehydration  Hypokalemia  Diarrhea, unspecified type  Generalized abdominal pain    Rx / DC Orders ED Discharge Orders     None         Lenard Simmer, PA-C 07/17/23 0947    Anders Simmonds T, DO 07/18/23 1124

## 2023-07-18 ENCOUNTER — Observation Stay (HOSPITAL_COMMUNITY): Payer: BC Managed Care – PPO

## 2023-07-18 DIAGNOSIS — R197 Diarrhea, unspecified: Secondary | ICD-10-CM | POA: Diagnosis not present

## 2023-07-18 DIAGNOSIS — E876 Hypokalemia: Secondary | ICD-10-CM | POA: Diagnosis not present

## 2023-07-18 DIAGNOSIS — N179 Acute kidney failure, unspecified: Secondary | ICD-10-CM | POA: Diagnosis not present

## 2023-07-18 DIAGNOSIS — E860 Dehydration: Secondary | ICD-10-CM | POA: Diagnosis not present

## 2023-07-18 LAB — CBC
HCT: 43.7 % (ref 36.0–46.0)
Hemoglobin: 14.2 g/dL (ref 12.0–15.0)
MCH: 30 pg (ref 26.0–34.0)
MCHC: 32.5 g/dL (ref 30.0–36.0)
MCV: 92.4 fL (ref 80.0–100.0)
Platelets: 232 10*3/uL (ref 150–400)
RBC: 4.73 MIL/uL (ref 3.87–5.11)
RDW: 13.2 % (ref 11.5–15.5)
WBC: 5.7 10*3/uL (ref 4.0–10.5)
nRBC: 0 % (ref 0.0–0.2)

## 2023-07-18 LAB — BASIC METABOLIC PANEL
Anion gap: 10 (ref 5–15)
BUN: 14 mg/dL (ref 6–20)
CO2: 23 mmol/L (ref 22–32)
Calcium: 9.2 mg/dL (ref 8.9–10.3)
Chloride: 104 mmol/L (ref 98–111)
Creatinine, Ser: 1.02 mg/dL — ABNORMAL HIGH (ref 0.44–1.00)
GFR, Estimated: >60 mL/min (ref >60)
Glucose, Bld: 93 mg/dL (ref 70–99)
Potassium: 4 mmol/L (ref 3.5–5.1)
Sodium: 137 mmol/L (ref 135–145)

## 2023-07-18 LAB — GLUCOSE, CAPILLARY: Glucose-Capillary: 99 mg/dL (ref 70–99)

## 2023-07-18 MED ORDER — IOHEXOL 300 MG/ML  SOLN
100.0000 mL | Freq: Once | INTRAMUSCULAR | Status: AC | PRN
  Administered 2023-07-18: 100 mL via INTRAVENOUS

## 2023-07-18 MED ORDER — ESTRADIOL 0.5 MG PO TABS
1.0000 mg | ORAL_TABLET | Freq: Every day | ORAL | Status: DC
  Administered 2023-07-18 – 2023-07-21 (×4): 1 mg via ORAL
  Filled 2023-07-18 (×4): qty 2

## 2023-07-18 MED ORDER — SODIUM CHLORIDE 0.9 % IV SOLN: INTRAVENOUS | Status: DC

## 2023-07-18 MED ORDER — SODIUM CHLORIDE 0.9 % IV SOLN: INTRAVENOUS | Status: AC

## 2023-07-18 NOTE — Progress Notes (Signed)
PROGRESS NOTE    Melissa Allen  ONG:[id number] DOB: Aug 31, 1966 DOA: 07/17/2023 PCP: Dois Davenport, MD   Brief Narrative:  HPI: Melissa Allen is a 57 y.o. female with medical history significant for treated breast cancer, obesity, hypertension, nephrolithiasis, chronic diarrhea being admitted to the hospital with AKI and hypokalemia due to dehydration.  She has had abdominal cramping and watery diarrhea with meals since September.  Denies any fevers, has severe nausea but no vomiting.  She was hospitalized in September with acute kidney injury and dizziness, her hydrochlorothiazide was discontinued during that hospital stay, and she has been off of it since that time.  She has not been eating or drinking very much since September, as any p.o. intake causes abdominal cramping and diarrhea.  Denies any blood in her stool.  She had breast reconstructive surgery back in August, and was on a course of Bactrim postoperatively, but otherwise no antibiotics.  No sick contacts.  She is followed by [facility] gastroenterology, but could not get an appointment with them until February.  Early this morning, she woke up from sleep feeling like she urgently had to have a bowel movement, got up quickly to go to the bathroom, got lightheaded, fell and hit her head on the corner of her computer table.  No loss of consciousness.  Top of her head feels slightly sore, but denies any neurologic symptoms.   Assessment & Plan:   Principal Problem:   AKI (acute kidney injury) (HCC) Active Problems:   Diarrhea   Hypokalemia   Pancreatic cyst   Abdominal pain  Acute kidney injury/dehydration-secondary to diarrhea, ATN/dehydration.  Presented with creatinine of 2.26.  Baseline creatinine within normal range.  Creatinine improved to 1.02.  1 more liter of IV fluid ordered.   Chronic diarrhea-unclear etiology, on review of [facility] gastro[facility]ology records, had screening colonoscopy and upper endoscopy January 2021.   Colonoscopy was revealed diminutive colon polyps which were adenomatous.  Upper endoscopy with mild esophagitis.  She had a course of oral antibiotics in August.  Patient has not had any bowel movement since admission.  C. difficile and GI pathogen panel are ordered and pending for that reason.  GI is involved.  They are planning for colonoscopy and likely EGD once infection is ruled out.  History of acute and chronic pancreatitis and pancreatic cyst now with persistent/more frequent abdominal pain: Patient complains of right upper quadrant abdominal pain.  She is slightly tender to palpation.  The pain has been ongoing for 3 weeks.  Reviewed her chart extensively, looks like patient had MRCP done in January 2024 where she was found to have a stable pancreatic cyst and recommendation was to repeat MRCP or CT abdomen pancreatic protocol in 1 year.  Due to persistent pain, I will order CT abdomen pelvis with pancreatic protocol while she is here to look for the size of cyst or any other new acute pathology/reason for the pain.   Hypokalemia: Resolved.   Gastritis-IV PPI   Non-insulin-dependent type 2 diabetes ruled out: Reviewed her hemoglobin A1c as far as 3 years ago.  The highest hemoglobin A1c was 5.73 years ago.  This qualifies for prediabetes and not diabetes.  She is on metformin at home.  Blood sugar is controlled.  Does not need SSI.  DVT prophylaxis: heparin injection 5,000 Units Start: 07/17/23 1400 SCDs Start: 07/17/23 0945   Code Status: Full Code  Family Communication:  None present at bedside.  Plan of care discussed with patient in  length and he/she verbalized understanding and agreed with it.  Status is: Observation The patient will require care spanning > 2 midnights and should be moved to inpatient because: Patient will need colonoscopy this hospitalization.   Estimated body mass index is 29.18 kg/m as calculated from the following:   Height as of this encounter: 5\' 4"  (1.626  m).   Weight as of this encounter: 77.1 kg.    Nutritional Assessment: Body mass index is 29.18 kg/m.Marland Kitchen Seen by dietician.  I agree with the assessment and plan as outlined below: Nutrition Status:        . Skin Assessment: I have examined the patient's skin and I agree with the wound assessment as performed by the wound care RN as outlined below:    Consultants:  GI  Procedures:  None  Antimicrobials:  Anti-infectives (From admission, onward)    None         Subjective: Seen and examined.  No new complaint other than abdominal pain in the right upper quadrant.  Has not had any diarrhea since admission.  Objective: Vitals:   07/17/23 1929 07/18/23 0054 07/18/23 0554 07/18/23 0825  BP: 119/69 99/65 (!) 93/58 93/77  Pulse: 96  87 87  Resp: 18 18 18    Temp: 98.7 F (37.1 C) 97.7 F (36.5 C) 97.7 F (36.5 C)   TempSrc: Oral Oral Oral   SpO2: 99% 90% 95%   Weight:      Height:        Intake/Output Summary (Last 24 hours) at 07/18/2023 1057 Last data filed at 07/17/2023 1500 Gross per 24 hour  Intake 676.67 ml  Output --  Net 676.67 ml   Filed Weights   07/17/23 0803  Weight: 77.1 kg    Examination:  General exam: Appears calm and comfortable  Respiratory system: Clear to auscultation. Respiratory effort normal. Cardiovascular system: S1 & S2 heard, RRR. No JVD, murmurs, rubs, gallops or clicks. No pedal edema. Gastrointestinal system: Abdomen is nondistended, soft and right upper quadrant abdominal tenderness. No organomegaly or masses felt. Normal bowel sounds heard. Central nervous system: Alert and oriented. No focal neurological deficits. Extremities: Symmetric 5 x 5 power. Skin: No rashes, lesions or ulcers Psychiatry: Judgement and insight appear normal. Mood & affect appropriate.    Data Reviewed: I have personally reviewed following labs and imaging studies  CBC: Recent Labs  Lab 07/17/23 0823 07/18/23 0748  WBC 9.0 5.7  NEUTROABS  5.3  --   HGB 13.6 14.2  HCT 41.3 43.7  MCV 91.6 92.4  PLT 294 232   Basic Metabolic Panel: Recent Labs  Lab 07/17/23 0823 07/18/23 0748  NA 137 137  K 3.0* 4.0  CL 102 104  CO2 21* 23  GLUCOSE 96 93  BUN 22* 14  CREATININE 2.26* 1.02*  CALCIUM 9.3 9.2   GFR: Estimated Creatinine Clearance: 60.5 mL/min (A) (by C-G formula based on SCr of 1.02 mg/dL (H)). Liver Function Tests: Recent Labs  Lab 07/17/23 0823  AST 23  ALT 17  ALKPHOS 69  BILITOT 0.7  PROT 7.3  ALBUMIN 4.1   Recent Labs  Lab 07/17/23 0823  LIPASE 35   No results for input(s): "AMMONIA" in the last 168 hours. Coagulation Profile: No results for input(s): "INR", "PROTIME" in the last 168 hours. Cardiac Enzymes: No results for input(s): "CKTOTAL", "CKMB", "CKMBINDEX", "TROPONINI" in the last 168 hours. BNP (last 3 results) No results for input(s): "PROBNP" in the last 8760 hours. HbA1C: Recent Labs  07/17/23 0823  HGBA1C 5.4   CBG: Recent Labs  Lab 07/17/23 1310 07/17/23 1708 07/17/23 2059 07/18/23 0731  GLUCAP 86 111* 126* 99   Lipid Profile: No results for input(s): "CHOL", "HDL", "LDLCALC", "TRIG", "CHOLHDL", "LDLDIRECT" in the last 72 hours. Thyroid Function Tests: No results for input(s): "TSH", "T4TOTAL", "FREET4", "T3FREE", "THYROIDAB" in the last 72 hours. Anemia Panel: No results for input(s): "VITAMINB12", "FOLATE", "FERRITIN", "TIBC", "IRON", "RETICCTPCT" in the last 72 hours. Sepsis Labs: No results for input(s): "PROCALCITON", "LATICACIDVEN" in the last 168 hours.  No results found for this or any previous visit (from the past 240 hour(s)).   Radiology Studies: No results found.  Scheduled Meds:  Dextromethorphan-buPROPion ER  1 tablet Oral BID   doxepin  25 mg Oral QHS   DULoxetine  120 mg Oral Daily   estradiol  1 mg Oral Daily   feeding supplement  237 mL Oral BID BM   heparin  5,000 Units Subcutaneous Q8H   pantoprazole  40 mg Oral Daily   QUEtiapine  400  mg Oral QHS   sucralfate  1 g Oral TID AC & HS   zolpidem  10 mg Oral QHS   Continuous Infusions:  sodium chloride       LOS: 0 days   Hughie Closs, MD [facility]  07/18/2023, 10:57 AM   *Please note that this is a verbal dictation therefore any spelling or grammatical errors are due to the "[employer] One" system interpretation.  Please page via Amion and do not message via secure chat for urgent patient care matters. Secure chat can be used for non urgent patient care matters.  How to contact the [facility] Attending or Consulting provider 7A - 7P or covering provider during after hours 7P -7A, for this patient?  Check the care team in [facility] and look for a) attending/consulting [facility] provider listed and b) the [facility] team listed. Page or secure chat 7A-7P. Log into [email or web address] and use [place]'s universal password to access. If you do not have the password, please contact the hospital operator. Locate the [facility] provider you are looking for under [facility] and page to a number that you can be directly reached. If you still have difficulty reaching the provider, please page the [facility] (Director on Call) for the Hospitalists listed on amion for assistance.

## 2023-07-18 NOTE — Progress Notes (Signed)
HISTORY OF PRESENT ILLNESS:  Melissa Allen is a 57 y.o. female admitted with complaints of persistent diarrhea, dehydration, and acute kidney injury with hypokalemia.  She has been rehydrated.  Feeling better.  Creatinine is now 1.02.  Potassium 4.0.  CBC normal.  Remarkably, she has had NO bowel movement since admission.  This would seem to make C. difficile or microscopic colitis unlikely.  Primary service planning CT.  An MRI in January.  REVIEW OF SYSTEMS:  All non-GI ROS negative. Past Medical History:  Diagnosis Date   Allergy    Anginal pain (HCC)    one episode 4-5 years ago-[facility]-nonspecific-no problems since-   Anxiety    Arthritis    multiple areas of arthritis- DDD   Asthma    recent admit to ER 01/11/12 for exac of asthma   Calcaneal fracture    Cancer (HCC)    Duplicated ureter, right    Family history of breast cancer    Family history of colon cancer    Family history of ovarian cancer    Family history of pancreatic cancer    Fibromyalgia    History of kidney stones    Hypertension    Obesity (BMI 30.0-34.9)    Pancreatitis 1986   Pneumonia    PONV (postoperative nausea and vomiting)    Poor venous access    hard to start iv-   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, was treated /w phenobarbital for a while, then taken off - 02/24/2020 occurred 30 years ago per patient   Shortness of breath dyspnea    Wears glasses     Past Surgical History:  Procedure Laterality Date   ABDOMINAL HYSTERECTOMY     ANTERIOR LAT LUMBAR FUSION Left 08/17/2018   Procedure: LUMBAR THREE-FOUR ANTERIOR LATERAL INTERBODY FUSION;  Surgeon: Julio Sicks, MD;  [facility];  Service: Neurosurgery;  Laterality: Left;   APPENDECTOMY  08/05/2017   BLADDER SUSPENSION  03/01/2012   Procedure: TRANSVAGINAL TAPE (TVT) PROCEDURE;  Surgeon: Allie Bossier, MD;  [facility];  Service: Gynecology;  Laterality: N/A;   BREAST CAPSULECTOMY WITH IMPLANT EXCHANGE  Bilateral 04/10/2023   Procedure: BREAST CAPSULORRHAPHY WITH SILICONE IMPLANT EXCHANGE;  Surgeon: Glenna Fellows, MD;  Location: [facility];  Service: Plastics;  Laterality: Bilateral;   BREAST RECONSTRUCTION WITH PLACEMENT OF TISSUE EXPANDER AND ALLODERM Bilateral 10/19/2020   Procedure: BILATERAL BREAST RECONSTRUCTION WITH PLACEMENT OF TISSUE EXPANDER AND ALLODERM;  Surgeon: Glenna Fellows, MD;  Location: [facility];  Service: Plastics;  Laterality: Bilateral;   BUNIONECTOMY  1995   Right   CHOLECYSTECTOMY  late 1980's       CYSTOSCOPY  03/01/2012   Procedure: CYSTOSCOPY;  Surgeon: Allie Bossier, MD;  [facility];  Service: Gynecology;  Laterality: N/A;   CYSTOSCOPY W/ URETERAL STENT PLACEMENT Left 12/15/2014   Procedure: CYSTOSCOPY, LEFT URETEROSCOPYU WITH STONE EXTRACTION;  Surgeon: Bjorn Pippin, MD;  [facility];  Service: Urology;  Laterality: Left;   CYSTOSCOPY W/ URETERAL STENT PLACEMENT Left 10/02/2016   Procedure: CYSTOSCOPY WITH RETROGRADE PYELOGRAM/URETERAL STENT PLACEMENT ureteroscopy, basket extraction;  Surgeon: Malen Gauze, MD;  [facility];  Service: Urology;  Laterality: Left;   HARDWARE REMOVAL N/A 12/22/2016   Procedure: Lumbar Four-Five Removal of Hardware;  Surgeon: Julio Sicks, MD;  Location: [facility] OR;  Service: Neurosurgery;  Laterality: N/A;   INCISION AND DRAINAGE ABSCESS Left 07/19/2013   Procedure: INCISION AND DRAINAGE LEFT LOWER  EXTERMITY HEMATOMA;  Surgeon: Wilmon Arms. Corliss Skains, MD;  Location: [facility];  Service: General;  Laterality: Left;  Excision left lower leg mass   KNEE CARTILAGE SURGERY     left   KNEE SURGERY Left    x 2, Murphy/Sue, corrected Patella displacement    LAMINECTOMY     LAPAROSCOPIC APPENDECTOMY N/A 08/05/2017   Procedure: APPENDECTOMY LAPAROSCOPIC;  Surgeon: Rodman Pickle, MD;  [facility];  Service: General;  Laterality: N/A;   LIPOSUCTION Bilateral 04/10/2023    Procedure: BILATERAL CHEST WALLS LIPOSUCTION;  Surgeon: Glenna Fellows, MD;  Location: [facility];  Service: Plastics;  Laterality: Bilateral;   LUMBAR LAMINECTOMY  06/2005   L4-5, Ray   LUMBAR PERCUTANEOUS PEDICLE SCREW 1 LEVEL Left 08/17/2018   Procedure: LUMBAR THREE-FOUR LUMBAR PERCUTANEOUS PEDICLE SCREW;  Surgeon: Julio Sicks, MD;  [facility];  Service: Neurosurgery;  Laterality: Left;   MASTECTOMY W/ SENTINEL NODE BIOPSY Bilateral 10/19/2020   Procedure: BILATERAL MASTECTOMY WITH LEFT SENTINEL LYMPH NODE BIOPSY;  Surgeon: Griselda Miner, MD;  Location: [facility];  Service: General;  Laterality: Bilateral;   POSTERIOR CERVICAL LAMINECTOMY Right 02/28/2020   Procedure: Right Cervical Six-Seven Laminectomy and Foraminotomy;  Surgeon: Julio Sicks, MD;  Location: [facility] OR;  Service: Neurosurgery;  Laterality: Right;  3C   R compressed pronator Right    Sypher, R arm   REMOVAL OF BILATERAL TISSUE EXPANDERS WITH PLACEMENT OF BILATERAL BREAST IMPLANTS Bilateral 01/28/2021   Procedure: REMOVAL OF BILATERAL TISSUE EXPANDERS WITH PLACEMENT OF BILATERAL BREAST IMPLANTS WITH ACELLULAR DERMIS;  Surgeon: Glenna Fellows, MD;  Location: [facility];  Service: Plastics;  Laterality: Bilateral;   RESECTION DISTAL CLAVICAL Right 06/30/2017   Procedure: RIGHT RESECTION DISTAL CLAVICAL;  Surgeon: Marcene Corning, MD;  [facility];  Service: Orthopedics;  Laterality: Right;   SHOULDER ACROMIOPLASTY Right 06/30/2017   Procedure: RIGHT SHOULDER ACROMIOPLASTY;  Surgeon: Marcene Corning, MD;  [facility];  Service: Orthopedics;  Laterality: Right;   SHOULDER ARTHROSCOPY Right 06/30/2017   Procedure: ARTHROSCOPY DEBRIDEMENT RIGHT SHOULDER;  Surgeon: Marcene Corning, MD;  [facility];  Service: Orthopedics;  Laterality: Right;   SPINAL FUSION     TENDON REPAIR  2013   TENDON REPAIR Right 02/01/2012   TONSILLECTOMY  age 53   TOTAL KNEE ARTHROPLASTY Left  08/12/2022   Procedure: LEFT TOTAL KNEE ARTHROPLASTY;  Surgeon: Marcene Corning, MD;  [facility];  Service: Orthopedics;  Laterality: Left;   TOTAL SHOULDER ARTHROPLASTY Right 08/22/2021   Procedure: TOTAL SHOULDER ARTHROPLASTY;  Surgeon: Jones Broom, MD;  [facility];  Service: Orthopedics;  Laterality: Right;    Social History NETHRA FRANCIONE  reports that she has never smoked. She has never used smokeless tobacco. She reports that she does not currently use alcohol. She reports that she does not use drugs.  family history includes Breast cancer in her maternal grandmother and another family member; Cancer in her mother; Colon cancer in her paternal grandfather; Colon polyps in her father; Dementia in her father; Diabetes in an other family member; Heart disease (age of onset: 54) in her father; Leukemia in her father; Ovarian cancer in an other family member; Ovarian cancer (age of onset: 45) in her maternal grandmother; Uterine cancer in an other family member.  Allergies  Allergen Reactions   Chlorquinaldol Rash    11/30/19 patch testing: positive reaction to Quinoline Mix (contains clioquinol and chlorquinaldol)   Clioquinol Rash  11/30/19 patch testing: positive reaction to Quinoline Mix (contains clioquinol and chlorquinaldol)    Other Anaphylaxis    Pt has anaphylactic reaction to something during procedure - unsure of what   Per patient, Quinoline Mix  - based on allergy testing   Latex Itching, Swelling and Dermatitis    SWELLING FROM FACE MASK RING AFTER LAST SURGERY    Lisinopril Cough        Codeine Nausea And Vomiting   Hydrocodone Itching   Tape Rash    TOLERATES PAPER TAPE ONLY NO PINK SURGICAL TAPE EITHER, EKG Leads    Wound Dressing Adhesive Rash    Dermabond       PHYSICAL EXAMINATION: Vital signs: BP 93/77 (BP Location: Left Arm)   Pulse 87   Temp 97.7 F (36.5 C) (Oral)   Resp 18   Ht 5\' 4"  (1.626 m)   Wt 77.1 kg   LMP 12/07/2011    SpO2 95%   BMI 29.18 kg/m  General: Well-developed, well-nourished, no acute distress HEENT: Sclerae are anicteric, conjunctiva pink. Oral mucosa intact Lungs: Clear Heart: Regular Abdomen: soft, nontender, nondistended, no obvious ascites, no peritoneal signs, normal bowel sounds. No organomegaly. Extremities: No edema Psychiatric: alert and oriented x3. Cooperative     ASSESSMENT:  1.  Complaints of diarrhea since September.  No diarrhea since admission. 2.  Dehydration hypokalemia.  Corrected 3.  On new blood pressure medicine at home.  The patient is supposed to find out the name.   PLAN:  1.  Continue with hydration 2.  Follow-up CT 3.  If she is doing well tomorrow, she could be discharged home.  I will make arrangements for her to have outpatient colonoscopy and upper endoscopy with me at our endoscopy center.  She would prefer this route, if possible.  I will see her tomorrow on rounds.

## 2023-07-18 NOTE — Progress Notes (Signed)
Unable to collected stool sample this shift as pt did not have a bowel  movement.,

## 2023-07-18 NOTE — Plan of Care (Signed)
  Problem: Education: Goal: Ability to describe self-care measures that may prevent or decrease complications (Diabetes Survival Skills Education) will improve Outcome: Progressing Goal: Individualized Educational Video(s) Outcome: Progressing   Problem: Coping: Goal: Ability to adjust to condition or change in health will improve Outcome: Progressing   Problem: Fluid Volume: Goal: Ability to maintain a balanced intake and output will improve Outcome: Progressing   Problem: Skin Integrity: Goal: Risk for impaired skin integrity will decrease Outcome: Progressing   Problem: Elimination: Goal: Will not experience complications related to urinary retention Outcome: Progressing   Problem: Pain Management: Goal: General experience of comfort will improve Outcome: Progressing

## 2023-07-19 DIAGNOSIS — E876 Hypokalemia: Secondary | ICD-10-CM | POA: Diagnosis not present

## 2023-07-19 DIAGNOSIS — E860 Dehydration: Secondary | ICD-10-CM | POA: Diagnosis not present

## 2023-07-19 DIAGNOSIS — N179 Acute kidney failure, unspecified: Secondary | ICD-10-CM | POA: Diagnosis not present

## 2023-07-19 DIAGNOSIS — R197 Diarrhea, unspecified: Secondary | ICD-10-CM | POA: Diagnosis not present

## 2023-07-19 LAB — BASIC METABOLIC PANEL
Anion gap: 6 (ref 5–15)
BUN: 11 mg/dL (ref 6–20)
CO2: 23 mmol/L (ref 22–32)
Calcium: 8.3 mg/dL — ABNORMAL LOW (ref 8.9–10.3)
Chloride: 107 mmol/L (ref 98–111)
Creatinine, Ser: 0.79 mg/dL (ref 0.44–1.00)
GFR, Estimated: >60 mL/min (ref >60)
Glucose, Bld: 91 mg/dL (ref 70–99)
Potassium: 3.5 mmol/L (ref 3.5–5.1)
Sodium: 136 mmol/L (ref 135–145)

## 2023-07-19 LAB — LACTIC ACID, PLASMA: Lactic Acid, Venous: 1.4 mmol/L (ref 0.5–1.9)

## 2023-07-19 MED ORDER — SODIUM CHLORIDE 0.9 % IV SOLN: INTRAVENOUS | Status: AC

## 2023-07-19 NOTE — Progress Notes (Signed)
PT Cancellation Note  Patient Details Name: Melissa Allen MRN: [id number] DOB: Mar 16, 1966   Cancelled Treatment:    Reason Eval/Treat Not Completed: PT screened, no needs identified, will sign off; per OT pt states she is amb I'ly and pt says she does not need PT    [facility] 07/19/2023, 10:22 AM

## 2023-07-19 NOTE — Evaluation (Signed)
Occupational Therapy Evaluation Patient Details Name: Melissa Allen MRN: [id number] DOB: June 14, 1966 Today's Date: 07/19/2023   History of Present Illness Patient is a 57 year old female who presented to the hosptial with abdominal cramping and diarrhea. Patient was admitted with AKI, dehydration, chronic diarrhea. WUJ:WJXBJ and chronic pancreatitis, pancreatic cyst, hypokalemia, gastritis, anterior-lumbar fusion L3-4, posterior cervical laminectomy 2021, R TSA 2022   Clinical Impression   Patient evaluated by Occupational Therapy with no further acute OT needs identified. All education has been completed and the patient has no further questions. Patient reported the only thing that needed to be worked on was her BP so she could go home. Patient reported taking herself to bathroom daily since admission. Patient did not need any physical A during session.  See below for any follow-up Occupational Therapy or equipment needs. OT is signing off. Thank you for this referral.  blood pressures Supine: 82/51 mmhg HR 86 bpm Sitting: 80/55 mmhg HR 99 bpm Standing 82/49 mmhg HR 103bpm Standing 3 mins 93/72 mmhg HR 135 bpm patient reported slight feelign change but not dizzy. MD made aware.       If plan is discharge home, recommend the following: Assistance with cooking/housework;Direct supervision/assist for medications management;Assist for transportation;Help with stairs or ramp for entrance;Direct supervision/assist for financial management;A little help with bathing/dressing/bathroom    Functional Status Assessment  Patient has not had a recent decline in their functional status  Equipment Recommendations  None recommended by OT       Precautions / Restrictions Precautions Precaution Comments: orthostatic Restrictions Weight Bearing Restrictions: No      Mobility Bed Mobility Overal bed mobility: Modified Independent                          Balance Overall balance  assessment: No apparent balance deficits (not formally assessed)       ADL either performed or assessed with clinical judgement   ADL Overall ADL's : At baseline     General ADL Comments: patient is MI for bed mobility, LB dressing and reported having walked around in room and to bathroom. patient reported that her issue is with her low BP. Bps as noted for above. patient reported she is ready to d/c home today. MD made aware of BPs,desire for pain meds and to go home.     Vision   Additional Comments: some dizziness with movement but reported that it is better than prior to admission            Pertinent Vitals/Pain Pain Assessment Pain Assessment: Faces Faces Pain Scale: Hurts little more Pain Location: back Pain Descriptors / Indicators: Grimacing, Constant Pain Intervention(s): Limited activity within patient's tolerance, Monitored during session, Patient requesting pain meds-RN notified     Extremity/Trunk Assessment Upper Extremity Assessment Upper Extremity Assessment: Overall WFL for tasks assessed   Lower Extremity Assessment Lower Extremity Assessment: Overall WFL for tasks assessed   Cervical / Trunk Assessment Cervical / Trunk Assessment: Normal      Cognition Arousal: Alert Behavior During Therapy: WFL for tasks assessed/performed Overall Cognitive Status: Within Functional Limits for tasks assessed           General Comments  see BPs as noted above.            Home Living Family/patient expects to be discharged to:: Private residence Living Arrangements: Spouse/significant other Available Help at Discharge: Family;Available 24 hours/day Type of Home: House Home Access: Stairs to enter  Entrance Stairs-Number of Steps: 1   Home Layout: Two level;Able to live on main level with bedroom/bathroom     Bathroom Shower/Tub: Walk-in shower         Home Equipment: Agricultural consultant (2 wheels);Cane - single point;BSC/3in1;Grab bars - tub/shower;Grab  bars - toilet          Prior Functioning/Environment Prior Level of Function : Independent/Modified Independent;Driving;History of Falls (last six months)             Mobility Comments: falls at home ADLs Comments: IND        OT Problem List: Decreased activity tolerance      OT Treatment/Interventions:      OT Goals(Current goals can be found in the care plan section) Acute Rehab OT Goals OT Goal Formulation: All assessment and education complete, DC therapy  OT Frequency:         AM-PAC OT "6 Clicks" Daily Activity     Outcome Measure Help from another person eating meals?: None Help from another person taking care of personal grooming?: None Help from another person toileting, which includes using toliet, bedpan, or urinal?: None Help from another person bathing (including washing, rinsing, drying)?: None Help from another person to put on and taking off regular upper body clothing?: None Help from another person to put on and taking off regular lower body clothing?: None 6 Click Score: 24   End of Session Nurse Communication: Mobility status;Other (comment) (vitals during session)  Activity Tolerance: Patient tolerated treatment well Patient left: with call bell/phone within reach  OT Visit Diagnosis: Unsteadiness on feet (R26.81)                Time: 1308-6578 OT Time Calculation (min): 17 min Charges:  OT General Charges $OT Visit: 1 Visit OT Evaluation $OT Eval Low Complexity: 1 Low  Kaily Wragg OTR/L, MS Acute Rehabilitation Department Office# [phone]   Selinda Flavin 07/19/2023, 10:18 AM

## 2023-07-19 NOTE — Progress Notes (Signed)
HISTORY OF PRESENT ILLNESS:  Melissa Allen is a 58 y.o. female with admitted to the hospital with recurrent issues with diarrhea, low blood pressure, dehydration, and acute kidney injury.  Doing well.  Sitting up eating salad.  Has not had a bowel movement since being admitted.  Electrolytes and kidney function have returned to normal.  CT scan was stable without any concerning or new abnormalities.  REVIEW OF SYSTEMS:  All non-GI ROS negative.  Past Medical History:  Diagnosis Date   Allergy    Anginal pain (HCC)    one episode 4-5 years ago-[facility]-nonspecific-no problems since-   Anxiety    Arthritis    multiple areas of arthritis- DDD   Asthma    recent admit to ER 01/11/12 for exac of asthma   Calcaneal fracture    Cancer (HCC)    Duplicated ureter, right    Family history of breast cancer    Family history of colon cancer    Family history of ovarian cancer    Family history of pancreatic cancer    Fibromyalgia    History of kidney stones    Hypertension    Obesity (BMI 30.0-34.9)    Pancreatitis 1986   Pneumonia    PONV (postoperative nausea and vomiting)    Poor venous access    hard to start iv-   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, was treated /w phenobarbital for a while, then taken off - 02/24/2020 occurred 30 years ago per patient   Shortness of breath dyspnea    Wears glasses     Past Surgical History:  Procedure Laterality Date   ABDOMINAL HYSTERECTOMY     ANTERIOR LAT LUMBAR FUSION Left 08/17/2018   Procedure: LUMBAR THREE-FOUR ANTERIOR LATERAL INTERBODY FUSION;  Surgeon: Julio Sicks, MD;  [facility];  Service: Neurosurgery;  Laterality: Left;   APPENDECTOMY  08/05/2017   BLADDER SUSPENSION  03/01/2012   Procedure: TRANSVAGINAL TAPE (TVT) PROCEDURE;  Surgeon: Allie Bossier, MD;  [facility];  Service: Gynecology;  Laterality: N/A;   BREAST CAPSULECTOMY WITH IMPLANT EXCHANGE Bilateral 04/10/2023   Procedure: BREAST  CAPSULORRHAPHY WITH SILICONE IMPLANT EXCHANGE;  Surgeon: Glenna Fellows, MD;  Location: [facility];  Service: Plastics;  Laterality: Bilateral;   BREAST RECONSTRUCTION WITH PLACEMENT OF TISSUE EXPANDER AND ALLODERM Bilateral 10/19/2020   Procedure: BILATERAL BREAST RECONSTRUCTION WITH PLACEMENT OF TISSUE EXPANDER AND ALLODERM;  Surgeon: Glenna Fellows, MD;  Location:  [facility]ERY [facility];  Service: Plastics;  Laterality: Bilateral;   BUNIONECTOMY  1995   Right   CHOLECYSTECTOMY  late 1980's       CYSTOSCOPY  03/01/2012   Procedure: CYSTOSCOPY;  Surgeon: Allie Bossier, MD;  [facility];  Service: Gynecology;  Laterality: N/A;   CYSTOSCOPY W/ URETERAL STENT PLACEMENT Left 12/15/2014   Procedure: CYSTOSCOPY, LEFT URETEROSCOPYU WITH STONE EXTRACTION;  Surgeon: Bjorn Pippin, MD;  [facility];  Service: Urology;  Laterality: Left;   CYSTOSCOPY W/ URETERAL STENT PLACEMENT Left 10/02/2016   Procedure: CYSTOSCOPY WITH RETROGRADE PYELOGRAM/URETERAL STENT PLACEMENT ureteroscopy, basket extraction;  Surgeon: Malen Gauze, MD;  [facility];  Service: Urology;  Laterality: Left;   HARDWARE REMOVAL N/A 12/22/2016   Procedure: Lumbar Four-Five Removal of Hardware;  Surgeon: Julio Sicks, MD;  Location[facility];  Service: Neurosurgery;  Laterality: N/A;   INCISION AND DRAINAGE ABSCESS Left 07/19/2013   Procedure: INCISION AND DRAINAGE LEFT LOWER EXTERMITY HEMATOMA;  Surgeon: Wilmon Arms. Tsuei,  MD;  Location: [facility];  Service: General;  Laterality: Left;  Excision left lower leg mass   KNEE CARTILAGE SURGERY     left   KNEE SURGERY Left    x 2, Murphy/Sue, corrected Patella displacement    LAMINECTOMY     LAPAROSCOPIC APPENDECTOMY N/A 08/05/2017   Procedure: APPENDECTOMY LAPAROSCOPIC;  Surgeon: Rodman Pickle, MD;  [facility];  Service: General;  Laterality: N/A;   LIPOSUCTION Bilateral 04/10/2023   Procedure: BILATERAL CHEST WALLS  LIPOSUCTION;  Surgeon: Glenna Fellows, MD;  Location: [facility];  Service: Plastics;  Laterality: Bilateral;   LUMBAR LAMINECTOMY  06/2005   L4-5, Ray   LUMBAR PERCUTANEOUS PEDICLE SCREW 1 LEVEL Left 08/17/2018   Procedure: LUMBAR THREE-FOUR LUMBAR PERCUTANEOUS PEDICLE SCREW;  Surgeon: Julio Sicks, MD;  [facility];  Service: Neurosurgery;  Laterality: Left;   MASTECTOMY W/ SENTINEL NODE BIOPSY Bilateral 10/19/2020   Procedure: BILATERAL MASTECTOMY WITH LEFT SENTINEL LYMPH NODE BIOPSY;  Surgeon: Griselda Miner, MD;  Location: [facility];  Service: General;  Laterality: Bilateral;   POSTERIOR CERVICAL LAMINECTOMY Right 02/28/2020   Procedure: Right Cervical Six-Seven Laminectomy and Foraminotomy;  Surgeon: Julio Sicks, MD;  Location: [facility] OR;  Service: Neurosurgery;  Laterality: Right;  3C   R compressed pronator Right    Sypher, R arm   REMOVAL OF BILATERAL TISSUE EXPANDERS WITH PLACEMENT OF BILATERAL BREAST IMPLANTS Bilateral 01/28/2021   Procedure: REMOVAL OF BILATERAL TISSUE EXPANDERS WITH PLACEMENT OF BILATERAL BREAST IMPLANTS WITH ACELLULAR DERMIS;  Surgeon: Glenna Fellows, MD;  Location: [facility];  Service: Plastics;  Laterality: Bilateral;   RESECTION DISTAL CLAVICAL Right 06/30/2017   Procedure: RIGHT RESECTION DISTAL CLAVICAL;  Surgeon: Marcene Corning, MD;  [facility];  Service: Orthopedics;  Laterality: Right;   SHOULDER ACROMIOPLASTY Right 06/30/2017   Procedure: RIGHT SHOULDER ACROMIOPLASTY;  Surgeon: Marcene Corning, MD;  [facility];  Service: Orthopedics;  Laterality: Right;   SHOULDER ARTHROSCOPY Right 06/30/2017   Procedure: ARTHROSCOPY DEBRIDEMENT RIGHT SHOULDER;  Surgeon: Marcene Corning, MD;  [facility];  Service: Orthopedics;  Laterality: Right;   SPINAL FUSION     TENDON REPAIR  2013   TENDON REPAIR Right 02/01/2012   TONSILLECTOMY  age 89   TOTAL KNEE ARTHROPLASTY Left 08/12/2022   Procedure: LEFT  TOTAL KNEE ARTHROPLASTY;  Surgeon: Marcene Corning, MD;  [facility];  Service: Orthopedics;  Laterality: Left;   TOTAL SHOULDER ARTHROPLASTY Right 08/22/2021   Procedure: TOTAL SHOULDER ARTHROPLASTY;  Surgeon: Jones Broom, MD;  [facility];  Service: Orthopedics;  Laterality: Right;    Social History CATRINA CHISMAN  reports that she has never smoked. She has never used smokeless tobacco. She reports that she does not currently use alcohol. She reports that she does not use drugs.  family history includes Breast cancer in her maternal grandmother and another family member; Cancer in her mother; Colon cancer in her paternal grandfather; Colon polyps in her father; Dementia in her father; Diabetes in an other family member; Heart disease (age of onset: 40) in her father; Leukemia in her father; Ovarian cancer in an other family member; Ovarian cancer (age of onset: 64) in her maternal grandmother; Uterine cancer in an other family member.  Allergies  Allergen Reactions   Chlorquinaldol Rash    11/30/19 patch testing: positive reaction to Quinoline Mix (contains clioquinol and chlorquinaldol)   Clioquinol Rash    11/30/19 patch testing: positive reaction to Quinoline  Mix (contains clioquinol and chlorquinaldol)    Other Anaphylaxis    Pt has anaphylactic reaction to something during procedure - unsure of what   Per patient, Quinoline Mix  - based on allergy testing   Latex Itching, Swelling and Dermatitis    SWELLING FROM FACE MASK RING AFTER LAST SURGERY    Lisinopril Cough        Codeine Nausea And Vomiting   Hydrocodone Itching   Tape Rash    TOLERATES PAPER TAPE ONLY NO PINK SURGICAL TAPE EITHER, EKG Leads    Wound Dressing Adhesive Rash    Dermabond       PHYSICAL EXAMINATION: Vital signs: BP 98/64 (BP Location: Right Arm)   Pulse 83   Temp 97.8 F (36.6 C) (Oral)   Resp 18   Ht 5\' 4"  (1.626 m)   Wt 77.1 kg   LMP 12/07/2011   SpO2 93%   BMI 29.18 kg/m   General: Well-developed, well-nourished, no acute distress HEENT: Sclerae are anicteric, conjunctiva pink. Oral mucosa intact Lungs: Clear Heart: Regular Abdomen: soft, nontender, nondistended, no obvious ascites, no peritoneal signs, normal bowel sounds. No organomegaly. Extremities: No edema Psychiatric: alert and oriented x3. Cooperative     ASSESSMENT:  1.  Problems with diarrhea.  Resolved 2.  Dehydration and acute kidney injury.  Resolved 3.  History of colon polyps   PLAN:  1.  No further plans from GI as inpatient. 2.  I will arrange for her to have outpatient colonoscopy and upper endoscopy at my office endoscopy center.  Patient aware. Will sign off

## 2023-07-19 NOTE — Plan of Care (Signed)
  Problem: Education: Goal: Ability to describe self-care measures that may prevent or decrease complications (Diabetes Survival Skills Education) will improve Outcome: Progressing Goal: Individualized Educational Video(s) Outcome: Progressing   Problem: Coping: Goal: Ability to adjust to condition or change in health will improve Outcome: Progressing   Problem: Fluid Volume: Goal: Ability to maintain a balanced intake and output will improve Outcome: Progressing   Problem: Skin Integrity: Goal: Risk for impaired skin integrity will decrease Outcome: Progressing   Problem: Pain Management: Goal: General experience of comfort will improve Outcome: Progressing   Problem: Safety: Goal: Ability to remain free from injury will improve Outcome: Progressing

## 2023-07-19 NOTE — Progress Notes (Signed)
PROGRESS NOTE    Melissa Allen  GMW:[id number] DOB: [birth year] DOA: 07/17/2023 PCP: Dois Davenport, MD   Brief Narrative:  HPI: Melissa Allen is a 57 y.o. female with medical history significant for treated breast cancer, obesity, hypertension, nephrolithiasis, chronic diarrhea being admitted to the hospital with AKI and hypokalemia due to dehydration.  She has had abdominal cramping and watery diarrhea with meals since September.  Denies any fevers, has severe nausea but no vomiting.  She was hospitalized in September with acute kidney injury and dizziness, her hydrochlorothiazide was discontinued during that hospital stay, and she has been off of it since that time.  She has not been eating or drinking very much since September, as any p.o. intake causes abdominal cramping and diarrhea.  Denies any blood in her stool.  She had breast reconstructive surgery back in August, and was on a course of Bactrim postoperatively, but otherwise no antibiotics.  No sick contacts.  She is followed by [facility] gastroenterology, but could not get an appointment with them until February.  Early this morning, she woke up from sleep feeling like she urgently had to have a bowel movement, got up quickly to go to the bathroom, got lightheaded, fell and hit her head on the corner of her computer table.  No loss of consciousness.  Top of her head feels slightly sore, but denies any neurologic symptoms.   Assessment & Plan:   Principal Problem:   AKI (acute kidney injury) (HCC) Active Problems:   Diarrhea   Hypokalemia   Pancreatic cyst   Abdominal pain  Acute kidney injury/dehydration-secondary to diarrhea, ATN/dehydration.  Presented with creatinine of 2.26.  Resolved and creatinine back to normal after IV fluids.   Chronic diarrhea-unclear etiology, on review of [facility] gastroenterology records, had screening colonoscopy and upper endoscopy January 2021.  Colonoscopy was revealed diminutive colon polyps which  were adenomatous.  Upper endoscopy with mild esophagitis.  She had a course of oral antibiotics in August.  Patient has not had any bowel movement since admission.  C. difficile and GI pathogen panel are ordered and pending for that reason.  GI was involved and signed off with plans to arrange colonoscopy and EGD as outpatient.  History of acute and chronic pancreatitis and pancreatic cyst now with persistent/more frequent abdominal pain: Patient complains of right upper quadrant abdominal pain.  She is slightly tender to palpation.  The pain has been ongoing for 3 weeks.  Reviewed her chart extensively, looks like patient had MRCP done in January 2024 where she was found to have a stable pancreatic cyst and recommendation was to repeat MRCP or CT abdomen pancreatic protocol in 1 year.  Due to persistent pain, CT abdomen pancreatic protocol was ordered which showed unchanged cystic lesion of the pancreatic head.  Patient still complains of abdominal pain though.  No acute pathology found.  History of hypertension but now with hypotension: Has history of hypertension, uses Coreg at home but blood pressure here has been quite low and dropped to 82/49 with a standing position today with OT, she had very brief episode of dizziness.  This is unusual for her.  No other signs or symptoms.  Aggressively hydrating her with fluids.  Will monitor today.   Hypokalemia: Resolved.   Gastritis-IV PPI   Non-insulin-dependent type 2 diabetes ruled out: Reviewed her hemoglobin A1c as far as 3 years ago.  The highest hemoglobin A1c was 5.73 years ago.  This qualifies for prediabetes and not diabetes.  She is on metformin at home.  Blood sugar is controlled.  Does not need SSI.  DVT prophylaxis: heparin injection 5,000 Units Start: 07/17/23 1400 SCDs Start: 07/17/23 0945   Code Status: Full Code  Family Communication:  None present at bedside.  Plan of care discussed with patient in length and he/she verbalized  understanding and agreed with it.  Status is: Observation The patient will require care spanning > 2 midnights and should be moved to inpatient because: Patient will need colonoscopy this hospitalization.   Estimated body mass index is 29.18 kg/m as calculated from the following:   Height as of this encounter: 5\' 4"  (1.626 m).   Weight as of this encounter: 77.1 kg.    Nutritional Assessment: Body mass index is 29.18 kg/m.Marland Kitchen Seen by dietician.  I agree with the assessment and plan as outlined below: Nutrition Status:        . Skin Assessment: I have examined the patient's skin and I agree with the wound assessment as performed by the wound care RN as outlined below:    Consultants:  GI  Procedures:  None  Antimicrobials:  Anti-infectives (From admission, onward)    None         Subjective: Seen and examined.  Still complains of abdominal pain.  Tolerating diet.  No bowel movement since admission.  Felt dizzy earlier working with OT today.  Objective: Vitals:   07/18/23 0825 07/18/23 1415 07/18/23 2029 07/19/23 0436  BP: 93/77 (!) 102/59 112/68 98/64  Pulse: 87 95 82 83  Resp:  18 16 18   Temp:  98.4 F (36.9 C) 98.3 F (36.8 C) 97.8 F (36.6 C)  TempSrc:  Oral Oral Oral  SpO2:  99% 99% 93%  Weight:      Height:        Intake/Output Summary (Last 24 hours) at 07/19/2023 1243 Last data filed at 07/19/2023 0900 Gross per 24 hour  Intake 480 ml  Output --  Net 480 ml   Filed Weights   07/17/23 0803  Weight: 77.1 kg    Examination:  General exam: Appears calm and comfortable, appears dry. Respiratory system: Clear to auscultation. Respiratory effort normal. Cardiovascular system: S1 & S2 heard, RRR. No JVD, murmurs, rubs, gallops or clicks. No pedal edema. Gastrointestinal system: Abdomen is nondistended, soft and nontender. No organomegaly or masses felt. Normal bowel sounds heard. Central nervous system: Alert and oriented. No focal  neurological deficits. Extremities: Symmetric 5 x 5 power. Skin: No rashes, lesions or ulcers.  Psychiatry: Judgement and insight appear normal. Mood & affect appropriate.   Data Reviewed: I have personally reviewed following labs and imaging studies  CBC: Recent Labs  Lab 07/17/23 0823 07/18/23 0748  WBC 9.0 5.7  NEUTROABS 5.3  --   HGB 13.6 14.2  HCT 41.3 43.7  MCV 91.6 92.4  PLT 294 232   Basic Metabolic Panel: Recent Labs  Lab 07/17/23 0823 07/18/23 0748 07/19/23 0714  NA 137 137 136  K 3.0* 4.0 3.5  CL 102 104 107  CO2 21* 23 23  GLUCOSE 96 93 91  BUN 22* 14 11  CREATININE 2.26* 1.02* 0.79  CALCIUM 9.3 9.2 8.3*   GFR: Estimated Creatinine Clearance: 77.1 mL/min (by C-G formula based on SCr of 0.79 mg/dL). Liver Function Tests: Recent Labs  Lab 07/17/23 0823  AST 23  ALT 17  ALKPHOS 69  BILITOT 0.7  PROT 7.3  ALBUMIN 4.1   Recent Labs  Lab 07/17/23 0823  LIPASE  35   No results for input(s): "AMMONIA" in the last 168 hours. Coagulation Profile: No results for input(s): "INR", "PROTIME" in the last 168 hours. Cardiac Enzymes: No results for input(s): "CKTOTAL", "CKMB", "CKMBINDEX", "TROPONINI" in the last 168 hours. BNP (last 3 results) No results for input(s): "PROBNP" in the last 8760 hours. HbA1C: Recent Labs    07/17/23 0823  HGBA1C 5.4   CBG: Recent Labs  Lab 07/17/23 1310 07/17/23 1708 07/17/23 2059 07/18/23 0731  GLUCAP 86 111* 126* 99   Lipid Profile: No results for input(s): "CHOL", "HDL", "LDLCALC", "TRIG", "CHOLHDL", "LDLDIRECT" in the last 72 hours. Thyroid Function Tests: No results for input(s): "TSH", "T4TOTAL", "FREET4", "T3FREE", "THYROIDAB" in the last 72 hours. Anemia Panel: No results for input(s): "VITAMINB12", "FOLATE", "FERRITIN", "TIBC", "IRON", "RETICCTPCT" in the last 72 hours. Sepsis Labs: Recent Labs  Lab 07/19/23 1106  LATICACIDVEN 1.4    No results found for this or any previous visit (from the past  240 hour(s)).   Radiology Studies: CT ABD PELVIS W/WO CM ONCOLOGY PANCREATIC PROTOCOL  Result Date: 07/18/2023 CLINICAL DATA:  Abdominal pain, pancreatic cysts, new abdominal pain history of breast cancer * Tracking Code: BO * EXAM: CT ABDOMEN AND PELVIS WITHOUT AND WITH CONTRAST TECHNIQUE: Multidetector CT imaging of the abdomen and pelvis was performed following the standard protocol before and following the bolus administration of intravenous contrast. RADIATION DOSE REDUCTION: This exam was performed according to the departmental dose-optimization program which includes automated exposure control, adjustment of the mA and/or kV according to patient size and/or use of iterative reconstruction technique. CONTRAST:  OMNIPAQUE IOHEXOL 300 MG/ML  SOLN COMPARISON:  CT abdomen pelvis, 06/11/2023, MR abdomen, 09/19/2022 FINDINGS: Lower chest: No acute abnormality. Status post bilateral mastectomy with implant reconstruction Hepatobiliary: No solid liver abnormality is seen. Large cyst of the central liver dome measuring 10.0 x 9.1 cm (series 19, image 38). Status post cholecystectomy. No biliary dilatation. Pancreas: Unchanged cystic lesion of the superior pancreatic head, previously characterized by MR. No pancreatic ductal dilatation or surrounding inflammatory changes. Spleen: Normal in size without significant abnormality. Adrenals/Urinary Tract: Definitively benign, macroscopic fat containing right adrenal adenoma, for which no further follow-up or characterization is required. Kidneys are normal, without renal calculi, solid lesion, or hydronephrosis. Stomach/Bowel: Stomach is within normal limits. No evidence of bowel wall thickening, distention, or inflammatory changes. Vascular/Lymphatic: Aortic atherosclerosis. No enlarged abdominal lymph nodes. Reproductive: Status post hysterectomy. Simple, benign left ovarian cysts, for which no further follow-up or characterization is required Other: No  abdominal wall hernia or abnormality. No ascites. Musculoskeletal: No acute or significant osseous findings. IMPRESSION: 1. No acute CT findings of the abdomen or pelvis to explain abdominal pain. 2. Unchanged cystic lesion of the superior pancreatic head, previously characterized by MR. No pancreatic ductal dilatation or surrounding inflammatory changes. This is stable over numerous prior examinations and is almost certainly benign, generally requiring no further follow-up or characterization 3. Large cyst of the central liver dome measuring 10.0 x 9.1 cm, benign, requiring no further follow-up or characterization. 4. Status post bilateral mastectomy with implant reconstruction. 5. Status post hysterectomy and cholecystectomy. Aortic Atherosclerosis (ICD10-I70.0). Electronically Signed   By: Jearld Lesch M.D.   On: 07/18/2023 20:21    Scheduled Meds:  Dextromethorphan-buPROPion ER  1 tablet Oral BID   doxepin  25 mg Oral QHS   DULoxetine  120 mg Oral Daily   estradiol  1 mg Oral Daily   feeding supplement  237 mL Oral BID BM  heparin  5,000 Units Subcutaneous Q8H   pantoprazole  40 mg Oral Daily   QUEtiapine  400 mg Oral QHS   sucralfate  1 g Oral TID AC & HS   zolpidem  10 mg Oral QHS   Continuous Infusions:  sodium chloride 200 mL/hr at 07/19/23 1117     LOS: 0 days   Hughie Closs, MD [facility]  07/19/2023, 12:43 PM   *Please note that this is a verbal dictation therefore any spelling or grammatical errors are due to the "Dragon Medical One" system interpretation.  Please page via Amion and do not message via secure chat for urgent patient care matters. Secure chat can be used for non urgent patient care matters.  How to contact the [facility] Attending or Consulting provider 7A - 7P or covering provider during after hours 7P -7A, for this patient?  Check the care team in [facility] and look for a) attending/consulting TRH provider listed and b) the [facility] team listed. Page or secure chat  7A-7P. Log into [email or web address] and use [place] universal password to access. If you do not have the password, please contact the hospital operator. Locate the [facility] provider you are looking for under [facility] and page to a number that you can be directly reached. If you still have difficulty reaching the provider, please page the [facility] (Director on Call) for the Hospitalists listed on amion for assistance.

## 2023-07-20 ENCOUNTER — Telehealth: Payer: Self-pay

## 2023-07-20 DIAGNOSIS — G8929 Other chronic pain: Secondary | ICD-10-CM | POA: Diagnosis present

## 2023-07-20 DIAGNOSIS — K2970 Gastritis, unspecified, without bleeding: Secondary | ICD-10-CM | POA: Diagnosis present

## 2023-07-20 DIAGNOSIS — K862 Cyst of pancreas: Secondary | ICD-10-CM | POA: Diagnosis present

## 2023-07-20 DIAGNOSIS — Z885 Allergy status to narcotic agent status: Secondary | ICD-10-CM | POA: Diagnosis not present

## 2023-07-20 DIAGNOSIS — Z79899 Other long term (current) drug therapy: Secondary | ICD-10-CM | POA: Diagnosis not present

## 2023-07-20 DIAGNOSIS — E669 Obesity, unspecified: Secondary | ICD-10-CM | POA: Diagnosis present

## 2023-07-20 DIAGNOSIS — Z853 Personal history of malignant neoplasm of breast: Secondary | ICD-10-CM | POA: Diagnosis not present

## 2023-07-20 DIAGNOSIS — M797 Fibromyalgia: Secondary | ICD-10-CM | POA: Diagnosis present

## 2023-07-20 DIAGNOSIS — N170 Acute kidney failure with tubular necrosis: Secondary | ICD-10-CM | POA: Diagnosis present

## 2023-07-20 DIAGNOSIS — K219 Gastro-esophageal reflux disease without esophagitis: Secondary | ICD-10-CM | POA: Diagnosis present

## 2023-07-20 DIAGNOSIS — I10 Essential (primary) hypertension: Secondary | ICD-10-CM | POA: Diagnosis present

## 2023-07-20 DIAGNOSIS — N179 Acute kidney failure, unspecified: Secondary | ICD-10-CM | POA: Diagnosis not present

## 2023-07-20 DIAGNOSIS — J45909 Unspecified asthma, uncomplicated: Secondary | ICD-10-CM | POA: Diagnosis present

## 2023-07-20 DIAGNOSIS — R7303 Prediabetes: Secondary | ICD-10-CM | POA: Diagnosis present

## 2023-07-20 DIAGNOSIS — Z8249 Family history of ischemic heart disease and other diseases of the circulatory system: Secondary | ICD-10-CM | POA: Diagnosis not present

## 2023-07-20 DIAGNOSIS — Z888 Allergy status to other drugs, medicaments and biological substances status: Secondary | ICD-10-CM | POA: Diagnosis not present

## 2023-07-20 DIAGNOSIS — Z79818 Long term (current) use of other agents affecting estrogen receptors and estrogen levels: Secondary | ICD-10-CM | POA: Diagnosis not present

## 2023-07-20 DIAGNOSIS — Z91040 Latex allergy status: Secondary | ICD-10-CM | POA: Diagnosis not present

## 2023-07-20 DIAGNOSIS — I951 Orthostatic hypotension: Secondary | ICD-10-CM | POA: Diagnosis present

## 2023-07-20 DIAGNOSIS — Z91048 Other nonmedicinal substance allergy status: Secondary | ICD-10-CM | POA: Diagnosis not present

## 2023-07-20 DIAGNOSIS — Z6829 Body mass index (BMI) 29.0-29.9, adult: Secondary | ICD-10-CM | POA: Diagnosis not present

## 2023-07-20 DIAGNOSIS — E876 Hypokalemia: Secondary | ICD-10-CM | POA: Diagnosis present

## 2023-07-20 DIAGNOSIS — Z7984 Long term (current) use of oral hypoglycemic drugs: Secondary | ICD-10-CM | POA: Diagnosis not present

## 2023-07-20 DIAGNOSIS — K529 Noninfective gastroenteritis and colitis, unspecified: Secondary | ICD-10-CM | POA: Diagnosis present

## 2023-07-20 DIAGNOSIS — E860 Dehydration: Secondary | ICD-10-CM | POA: Diagnosis present

## 2023-07-20 LAB — BASIC METABOLIC PANEL
Anion gap: 7 (ref 5–15)
BUN: 12 mg/dL (ref 6–20)
CO2: 24 mmol/L (ref 22–32)
Calcium: 8 mg/dL — ABNORMAL LOW (ref 8.9–10.3)
Chloride: 106 mmol/L (ref 98–111)
Creatinine, Ser: 0.71 mg/dL (ref 0.44–1.00)
GFR, Estimated: >60 mL/min (ref >60)
Glucose, Bld: 98 mg/dL (ref 70–99)
Potassium: 3.8 mmol/L (ref 3.5–5.1)
Sodium: 137 mmol/L (ref 135–145)

## 2023-07-20 MED ORDER — POLYETHYLENE GLYCOL 3350 17 G PO PACK: 17.0000 g | PACK | Freq: Every day | ORAL | Status: DC

## 2023-07-20 MED ORDER — SODIUM CHLORIDE 0.9 % IV SOLN: INTRAVENOUS | Status: AC

## 2023-07-20 MED ORDER — BISACODYL 10 MG RE SUPP: 10.0000 mg | Freq: Once | RECTAL | Status: DC

## 2023-07-20 MED ORDER — SENNOSIDES-DOCUSATE SODIUM 8.6-50 MG PO TABS
2.0000 | ORAL_TABLET | Freq: Every day | ORAL | Status: DC
  Administered 2023-07-20: 2 via ORAL
  Filled 2023-07-20: qty 2

## 2023-07-20 MED ORDER — SODIUM CHLORIDE 0.9 % IV BOLUS
1000.0000 mL | Freq: Once | INTRAVENOUS | Status: AC
  Administered 2023-07-20: 1000 mL via INTRAVENOUS

## 2023-07-20 MED ORDER — PANTOPRAZOLE SODIUM 40 MG IV SOLR
40.0000 mg | Freq: Two times a day (BID) | INTRAVENOUS | Status: DC
  Administered 2023-07-20: 40 mg via INTRAVENOUS
  Filled 2023-07-20: qty 10

## 2023-07-20 MED ORDER — OXYCODONE-ACETAMINOPHEN 5-325 MG PO TABS
1.0000 | ORAL_TABLET | Freq: Four times a day (QID) | ORAL | Status: DC | PRN
  Administered 2023-07-20 – 2023-07-21 (×2): 1 via ORAL
  Filled 2023-07-20 (×2): qty 1

## 2023-07-20 MED ORDER — TIZANIDINE HCL 4 MG PO TABS
4.0000 mg | ORAL_TABLET | Freq: Three times a day (TID) | ORAL | Status: DC | PRN
  Administered 2023-07-20 – 2023-07-21 (×2): 4 mg via ORAL
  Filled 2023-07-20 (×2): qty 1

## 2023-07-20 NOTE — TOC CM/SW Note (Signed)
Transition of Care [facility]) - Inpatient Brief Assessment   Patient Details  Name: Melissa Allen MRN: [id number] Date of Birth: [birth year]  [facility] CM/SW Contact:    Darleene Cleaver, LCSW Phone Number: 07/20/2023, 5:12 PM   Clinical Narrative:  Patient is from home, no SDOH needs.  Plan to return back home once medically ready for discharge.   Transition of Care Asessment: Insurance and Status: Insurance coverage has been reviewed Patient has primary care physician: Yes Home environment has been reviewed: Yes Prior level of function:: Indep Prior/Current Home Services: No current home services Social Determinants of Health Reivew: SDOH reviewed no interventions necessary Readmission risk has been reviewed: Yes Transition of care needs: no transition of care needs at this time

## 2023-07-20 NOTE — Plan of Care (Signed)
  Problem: Clinical Measurements: Goal: Ability to maintain clinical measurements within normal limits will improve Outcome: Progressing Goal: Will remain free from infection Outcome: Progressing Goal: Diagnostic test results will improve Outcome: Progressing Goal: Respiratory complications will improve Outcome: Progressing Goal: Cardiovascular complication will be avoided Outcome: Progressing   Problem: Activity: Goal: Risk for activity intolerance will decrease Outcome: Progressing   Problem: Coping: Goal: Level of anxiety will decrease Outcome: Progressing   Problem: Elimination: Goal: Will not experience complications related to bowel motility Outcome: Progressing Goal: Will not experience complications related to urinary retention Outcome: Progressing   Problem: Pain Management: Goal: General experience of comfort will improve Outcome: Progressing   Problem: Safety: Goal: Ability to remain free from injury will improve Outcome: Progressing

## 2023-07-20 NOTE — Plan of Care (Signed)

## 2023-07-20 NOTE — Telephone Encounter (Signed)
Pt scheduled for previsit 08/13/23 (telephone) at 8:30am. Endo/colon scheduled in the [facility] 08/24/23 at 2:30pm. Pt aware of appts.

## 2023-07-20 NOTE — Progress Notes (Signed)
PROGRESS NOTE    Melissa KARY  Allen:[id number] DOB: [birth year] DOA: 07/17/2023 PCP: Dois Davenport, MD   Brief Narrative:  HPI: Melissa Allen is a 57 y.o. female with medical history significant for treated breast cancer, obesity, hypertension, nephrolithiasis, chronic diarrhea being admitted to the hospital with AKI and hypokalemia due to dehydration.  She has had abdominal cramping and watery diarrhea with meals since September.  Denies any fevers, has severe nausea but no vomiting.  She was hospitalized in September with acute kidney injury and dizziness, her hydrochlorothiazide was discontinued during that hospital stay, and she has been off of it since that time.  She has not been eating or drinking very much since September, as any p.o. intake causes abdominal cramping and diarrhea.  Denies any blood in her stool.  She had breast reconstructive surgery back in August, and was on a course of Bactrim postoperatively, but otherwise no antibiotics.  No sick contacts.  She is followed by [facility] gastroenterology, but could not get an appointment with them until February.  Early this morning, she woke up from sleep feeling like she urgently had to have a bowel movement, got up quickly to go to the bathroom, got lightheaded, fell and hit her head on the corner of her computer table.  No loss of consciousness.  Top of her head feels slightly sore, but denies any neurologic symptoms.   Assessment & Plan:   Principal Problem:   AKI (acute kidney injury) (HCC) Active Problems:   Diarrhea   Hypokalemia   Pancreatic cyst   Abdominal pain  Acute kidney injury/dehydration-secondary to diarrhea, ATN/dehydration.  Presented with creatinine of 2.26.  Resolved and creatinine back to normal after IV fluids.   Chronic diarrhea-unclear etiology, on review of [place] gastroenterology records, had screening colonoscopy and upper endoscopy January 2021.  Colonoscopy was revealed diminutive colon polyps which  were adenomatous.  Upper endoscopy with mild esophagitis.  She had a course of oral antibiotics in August.  Patient has not had any bowel movement since admission.  C. difficile and GI pathogen panel are ordered and pending for that reason.  GI was involved and signed off with plans to arrange colonoscopy and EGD as outpatient.  History of acute and chronic pancreatitis and pancreatic cyst now with persistent/more frequent abdominal pain: Patient complains of right upper quadrant abdominal pain.  She is slightly tender to palpation.  The pain has been ongoing for 3 weeks.  Reviewed her chart extensively, looks like patient had MRCP done in January 2024 where she was found to have a stable pancreatic cyst and recommendation was to repeat MRCP or CT abdomen pancreatic protocol in 1 year.  Due to persistent pain, CT abdomen pancreatic protocol was ordered which showed unchanged cystic lesion of the pancreatic head.  Patient still complains of abdominal pain though.  No acute pathology found.  Likely gastritis: Patient now complains of epigastric pain and indigestion symptoms.  She is tells me that she has a history of indigestion for which she was on omeprazole in the past but she does not take that at home.  I will transition her to IV Protonix and make it twice daily.  History of hypertension but now with hypotension: Has history of hypertension, uses Coreg at home but blood pressure here has been quite low and dropped to 82/49 with a standing position today with OT, she had very brief episode of dizziness.  This is unusual for her.  No other signs or symptoms.  Aggressively hydrating her with fluids.  Will monitor today.   Hypokalemia: Resolved.   Gastritis-IV PPI  Orthostatic hypotension: Patient has been having low blood pressure and orthostatic hypotension since yesterday and she is also symptomatic.  I gave her 2 fluid boluses today, her blood pressure has improved and she is feeling better but she  feels uncomfortable going home today.  I realize that she is on several medications for sleep at night such as Seroquel, Ambien as well as doxepin.  She is also on tizanidine and oxycodone.  Most of these medications can cause low blood pressure.  I am going to reduce the frequency of tizanidine and oxycodone.  Unfortunately, I cannot change other medications since she has been on them for so long.  I will start her on normal saline at 100 cc/h for next 15 hours.  I have informed her that if her blood pressure will remain improved by tomorrow, she will be discharged home.   Non-insulin-dependent type 2 diabetes ruled out: Reviewed her hemoglobin A1c as far as 3 years ago.  The highest hemoglobin A1c was 5.73 years ago.  This qualifies for prediabetes and not diabetes.  She is on metformin at home.  Blood sugar is controlled.  Does not need SSI.  DVT prophylaxis: heparin injection 5,000 Units Start: 07/17/23 1400 SCDs Start: 07/17/23 0945   Code Status: Full Code  Family Communication:  None present at bedside.  Plan of care discussed with patient in length and he/she verbalized understanding and agreed with it.  Status is: Observation The patient will require care spanning > 2 midnights and should be moved to inpatient because: Patient will need colonoscopy this hospitalization.   Estimated body mass index is 29.18 kg/m as calculated from the following:   Height as of this encounter: 5\' 4"  (1.626 m).   Weight as of this encounter: 77.1 kg.    Nutritional Assessment: Body mass index is 29.18 kg/m.Marland Kitchen Seen by dietician.  I agree with the assessment and plan as outlined below: Nutrition Status:        . Skin Assessment: I have examined the patient's skin and I agree with the wound assessment as performed by the wound care RN as outlined below:    Consultants:  GI  Procedures:  None  Antimicrobials:  Anti-infectives (From admission, onward)    None         Subjective: Seen  and 7.  Still complains of intermittent abdominal pain.  No nausea.  Tolerating diet.  Objective: Vitals:   07/20/23 1555 07/20/23 1559 07/20/23 1604 07/20/23 1607  BP: (!) 101/57 91/67 100/67 110/70  Pulse: 82 84 89 88  Resp: 16 18 19 18   Temp:      TempSrc:      SpO2: 90% 92% 94% 94%  Weight:      Height:        Intake/Output Summary (Last 24 hours) at 07/20/2023 1633 Last data filed at 07/20/2023 1230 Gross per 24 hour  Intake 1430.98 ml  Output --  Net 1430.98 ml   Filed Weights   07/17/23 0803  Weight: 77.1 kg    Examination:  General exam: Appears calm and comfortable  Respiratory system: Clear to auscultation. Respiratory effort normal. Cardiovascular system: S1 & S2 heard, RRR. No JVD, murmurs, rubs, gallops or clicks. No pedal edema. Gastrointestinal system: Abdomen is nondistended, soft and mild epigastric tenderness. No organomegaly or masses felt. Normal bowel sounds heard. Central nervous system: Alert and oriented. No focal neurological  deficits. Extremities: Symmetric 5 x 5 power. Skin: No rashes, lesions or ulcers.  Psychiatry: Judgement and insight appear normal. Mood & affect appropriate.    Data Reviewed: I have personally reviewed following labs and imaging studies  CBC: Recent Labs  Lab 07/17/23 0823 07/18/23 0748  WBC 9.0 5.7  NEUTROABS 5.3  --   HGB 13.6 14.2  HCT 41.3 43.7  MCV 91.6 92.4  PLT 294 232   Basic Metabolic Panel: Recent Labs  Lab 07/17/23 0823 07/18/23 0748 07/19/23 0714 07/20/23 0555  NA 137 137 136 137  K 3.0* 4.0 3.5 3.8  CL 102 104 107 106  CO2 21* 23 23 24   GLUCOSE 96 93 91 98  BUN 22* 14 11 12   CREATININE 2.26* 1.02* 0.79 0.71  CALCIUM 9.3 9.2 8.3* 8.0*   GFR: Estimated Creatinine Clearance: 77.1 mL/min (by C-G formula based on SCr of 0.71 mg/dL). Liver Function Tests: Recent Labs  Lab 07/17/23 0823  AST 23  ALT 17  ALKPHOS 69  BILITOT 0.7  PROT 7.3  ALBUMIN 4.1   Recent Labs  Lab  07/17/23 0823  LIPASE 35   No results for input(s): "AMMONIA" in the last 168 hours. Coagulation Profile: No results for input(s): "INR", "PROTIME" in the last 168 hours. Cardiac Enzymes: No results for input(s): "CKTOTAL", "CKMB", "CKMBINDEX", "TROPONINI" in the last 168 hours. BNP (last 3 results) No results for input(s): "PROBNP" in the last 8760 hours. HbA1C: No results for input(s): "HGBA1C" in the last 72 hours.  CBG: Recent Labs  Lab 07/17/23 1310 07/17/23 1708 07/17/23 2059 07/18/23 0731  GLUCAP 86 111* 126* 99   Lipid Profile: No results for input(s): "CHOL", "HDL", "LDLCALC", "TRIG", "CHOLHDL", "LDLDIRECT" in the last 72 hours. Thyroid Function Tests: No results for input(s): "TSH", "T4TOTAL", "FREET4", "T3FREE", "THYROIDAB" in the last 72 hours. Anemia Panel: No results for input(s): "VITAMINB12", "FOLATE", "FERRITIN", "TIBC", "IRON", "RETICCTPCT" in the last 72 hours. Sepsis Labs: Recent Labs  Lab 07/19/23 1106  LATICACIDVEN 1.4    No results found for this or any previous visit (from the past 240 hour(s)).   Radiology Studies: CT ABD PELVIS W/WO CM ONCOLOGY PANCREATIC PROTOCOL  Result Date: 07/18/2023 CLINICAL DATA:  Abdominal pain, pancreatic cysts, new abdominal pain history of breast cancer * Tracking Code: BO * EXAM: CT ABDOMEN AND PELVIS WITHOUT AND WITH CONTRAST TECHNIQUE: Multidetector CT imaging of the abdomen and pelvis was performed following the standard protocol before and following the bolus administration of intravenous contrast. RADIATION DOSE REDUCTION: This exam was performed according to the departmental dose-optimization program which includes automated exposure control, adjustment of the mA and/or kV according to patient size and/or use of iterative reconstruction technique. CONTRAST:  OMNIPAQUE IOHEXOL 300 MG/ML  SOLN COMPARISON:  CT abdomen pelvis, 06/11/2023, MR abdomen, 09/19/2022 FINDINGS: Lower chest: No acute abnormality. Status  post bilateral mastectomy with implant reconstruction Hepatobiliary: No solid liver abnormality is seen. Large cyst of the central liver dome measuring 10.0 x 9.1 cm (series 19, image 38). Status post cholecystectomy. No biliary dilatation. Pancreas: Unchanged cystic lesion of the superior pancreatic head, previously characterized by MR. No pancreatic ductal dilatation or surrounding inflammatory changes. Spleen: Normal in size without significant abnormality. Adrenals/Urinary Tract: Definitively benign, macroscopic fat containing right adrenal adenoma, for which no further follow-up or characterization is required. Kidneys are normal, without renal calculi, solid lesion, or hydronephrosis. Stomach/Bowel: Stomach is within normal limits. No evidence of bowel wall thickening, distention, or inflammatory changes. Vascular/Lymphatic: Aortic atherosclerosis.  No enlarged abdominal lymph nodes. Reproductive: Status post hysterectomy. Simple, benign left ovarian cysts, for which no further follow-up or characterization is required Other: No abdominal wall hernia or abnormality. No ascites. Musculoskeletal: No acute or significant osseous findings. IMPRESSION: 1. No acute CT findings of the abdomen or pelvis to explain abdominal pain. 2. Unchanged cystic lesion of the superior pancreatic head, previously characterized by MR. No pancreatic ductal dilatation or surrounding inflammatory changes. This is stable over numerous prior examinations and is almost certainly benign, generally requiring no further follow-up or characterization 3. Large cyst of the central liver dome measuring 10.0 x 9.1 cm, benign, requiring no further follow-up or characterization. 4. Status post bilateral mastectomy with implant reconstruction. 5. Status post hysterectomy and cholecystectomy. Aortic Atherosclerosis (ICD10-I70.0). Electronically Signed   By: Jearld Lesch M.D.   On: 07/18/2023 20:21    Scheduled Meds:  Dextromethorphan-buPROPion ER   1 tablet Oral BID   doxepin  25 mg Oral QHS   DULoxetine  120 mg Oral Daily   estradiol  1 mg Oral Daily   feeding supplement  237 mL Oral BID BM   heparin  5,000 Units Subcutaneous Q8H   pantoprazole (PROTONIX) IV  40 mg Intravenous Q12H   QUEtiapine  400 mg Oral QHS   sucralfate  1 g Oral TID AC & HS   zolpidem  10 mg Oral QHS   Continuous Infusions:  sodium chloride       LOS: 0 days   Hughie Closs, MD [facility]  07/20/2023, 4:33 PM   *Please note that this is a verbal dictation therefore any spelling or grammatical errors are due to the "[employer] One" system interpretation.  Please page via Amion and do not message via secure chat for urgent patient care matters. Secure chat can be used for non urgent patient care matters.  How to contact the [facility] Attending or Consulting provider 7A - 7P or covering provider during after hours 7P -7A, for this patient?  Check the care team in [facility] and look for a) attending/consulting [facility] provider listed and b) the [facility] team listed. Page or secure chat 7A-7P. Log into [email or web address] and use [facility]'s universal password to access. If you do not have the password, please contact the hospital operator. Locate the [facility] provider you are looking for under [facility] and page to a number that you can be directly reached. If you still have difficulty reaching the provider, please page the [facility] (Director on Call) for the Hospitalists listed on amion for assistance.

## 2023-07-20 NOTE — Telephone Encounter (Signed)
-----   Message from Yancey Flemings sent at 07/19/2023 12:22 PM EST ----- Regarding: Needs set up for procedures in [facility], I saw this patient in the hospital over the weekend.  She should be discharged home tomorrow, most likely. Please set her up for colonoscopy and upper endoscopy in the [facility].  Reasons are history of polyps, diarrhea, and GERD. Thanks, Dr. Marina Goodell

## 2023-07-21 DIAGNOSIS — N179 Acute kidney failure, unspecified: Secondary | ICD-10-CM | POA: Diagnosis not present

## 2023-07-21 DIAGNOSIS — I951 Orthostatic hypotension: Secondary | ICD-10-CM | POA: Insufficient documentation

## 2023-07-21 MED ORDER — OMEPRAZOLE 40 MG PO CPDR: 40.0000 mg | DELAYED_RELEASE_CAPSULE | Freq: Two times a day (BID) | ORAL | 0 refills | Status: AC

## 2023-07-21 NOTE — Discharge Summary (Signed)
Physician Discharge Summary  Melissa Allen NWG:[id number] DOB: [birth year] DOA: 07/17/2023  PCP: Dois Davenport, MD  Admit date: 07/17/2023 Discharge date: 07/21/2023 30 Day Unplanned Readmission Risk Score    Flowsheet Row ED to Hosp-Admission (Current) from 07/17/2023 in [facility]  30 Day Unplanned Readmission Risk Score (%) 18.54 Filed at 07/21/2023 0801       This score is the patient's risk of an unplanned readmission within 30 days of being discharged (0 -100%). The score is based on dignosis, age, lab data, medications, orders, and past utilization.   Low:  0-14.9   Medium: 15-21.9   High: 22-29.9   Extreme: 30 and above          Admitted From: Home Disposition: Home  Recommendations for Outpatient Follow-up:  Follow up with PCP in 1-2 weeks Please obtain BMP/CBC in one week Please follow up with your PCP on the following pending results: Unresulted Labs (From admission, onward)    None         Home Health: None Equipment/Devices: None  Discharge Condition: Stable CODE STATUS: Full code Diet recommendation: Cardiac  Subjective: Seen and examined.  Doing much better.  No dizziness or other complaint.  She now feels very comfortable going home.  Brief/Interim Summary: Melissa Allen is a 57 y.o. female with medical history significant for treated breast cancer, obesity, hypertension, nephrolithiasis, chronic diarrhea came into the ED with a complaint of diarrhea and fall at home and was admitted to the hospital with AKI and hypokalemia due to dehydration.  Denies any fevers or vomiting.  She was hospitalized in September with acute kidney injury and dizziness, her hydrochlorothiazide was discontinued during that hospital stay, and she has been off of it since that time.  She has not been eating or drinking very much since September, as any p.o. intake causes abdominal cramping and diarrhea.  Denies any blood in her stool.  She had breast reconstructive  surgery back in August, and was on a course of Bactrim postoperatively, but otherwise no antibiotics.  No sick contacts.  She is followed by [facility] gastroenterology, but could not get an appointment with them until February.  She was in.  For the following.   Acute kidney injury/dehydration-secondary to diarrhea, ATN/dehydration.  Presented with creatinine of 2.26.  Resolved and creatinine back to normal after IV fluids.   Chronic diarrhea-unclear etiology, on review of [place] [facility] gastroenterology records, had screening colonoscopy and upper endoscopy January 2021.  Colonoscopy was revealed diminutive colon polyps which were adenomatous.  Upper endoscopy with mild esophagitis.  She had a course of oral antibiotics in August.  Patient has not had any bowel movement since admission.  C. difficile and GI pathogen panel were ordered but since she has not had any BM, they were never checked.  GI was involved and signed off with plans to arrange colonoscopy and EGD as outpatient.   History of acute and chronic pancreatitis and pancreatic cyst now with persistent/more frequent abdominal pain: Patient complains of right upper quadrant abdominal pain.  She is slightly tender to palpation.  The pain has been ongoing for 3 weeks.  Reviewed her chart extensively, looks like patient had MRCP done in January 2024 where she was found to have a stable pancreatic cyst and recommendation was to repeat MRCP or CT abdomen pancreatic protocol in 1 year.  Due to persistent pain, CT abdomen pancreatic protocol was ordered which showed unchanged cystic lesion of the pancreatic head.  Patient  still complains abdominal pain but it is intermittent and bearable.  She is on pain medications from home.   Likely gastritis: Patient complained of epigastric pain and indigestion symptoms.  She is tells me that she has a history of indigestion for which she was on omeprazole in the past but she does not take that at home.  Started her on  Protonix here.  Discharging on omeprazole 40 mg p.o. twice daily for 4 weeks.  GI planning EGD as outpatient.   History of hypertension but now with hypotension: Has history of hypertension, uses Coreg at home but blood pressure was dropping and she was positive orthostatics up until yesterday.  She was aggressively hydrated.  Coreg was hold during the whole time of hospitalization.  Previously, hydrochlorothiazide was also discontinued.  Based on this, I am discontinuing her Coreg.  I have strongly advised her to monitor/check her blood pressure 2-3 times a day, make a log and not take Coreg as long as her systolic is less than 160 and diastolic is less than 90.  She was advised to call PCP if her blood pressure rises above those numbers.   Hypokalemia: Resolved.   Gastritis-IV PPI   Non-insulin-dependent type 2 diabetes ruled out: Reviewed her hemoglobin A1c as far as 3 years ago.  The highest hemoglobin A1c was 5.73 years ago.  This qualifies for prediabetes and not diabetes.  She is on metformin at home.  Blood sugar is controlled.    Discharge plan was discussed with patient and/or family member and they verbalized understanding and agreed with it.  Discharge Diagnoses:  Principal Problem:   AKI (acute kidney injury) (HCC) Active Problems:   Diarrhea   Hypokalemia   Pancreatic cyst   Abdominal pain   Orthostatic hypotension    Discharge Instructions   Allergies as of 07/21/2023       Reactions   Chlorquinaldol Rash   11/30/19 patch testing: positive reaction to Quinoline Mix (contains clioquinol and chlorquinaldol)   Clioquinol Rash   11/30/19 patch testing: positive reaction to Quinoline Mix (contains clioquinol and chlorquinaldol)   Other Anaphylaxis   Pt has anaphylactic reaction to something during procedure - unsure of what  Per patient, Quinoline Mix  - based on allergy testing   Latex Itching, Swelling, Dermatitis   SWELLING FROM FACE MASK RING AFTER LAST SURGERY    Lisinopril Cough      Codeine Nausea And Vomiting   Hydrocodone Itching   Tape Rash   TOLERATES PAPER TAPE ONLY NO PINK SURGICAL TAPE EITHER, EKG Leads    Wound Dressing Adhesive Rash   Dermabond        Medication List     STOP taking these medications    carvedilol 3.125 MG tablet Commonly known as: COREG       TAKE these medications    albuterol 108 (90 Base) MCG/ACT inhaler Commonly known as: ProAir HFA INHALE TWO PUFFS EVERY FOUR HOURS AS NEEDED SHORTNESS OF BREATH What changed:  how much to take how to take this when to take this reasons to take this additional instructions   Auvelity 45-105 MG Tbcr Generic drug: Dextromethorphan-buPROPion ER Take 1 tablet by mouth in the morning and at bedtime.   doxepin 25 MG capsule Commonly known as: SINEQUAN Take 25 mg by mouth at bedtime.   DULoxetine 60 MG capsule Commonly known as: CYMBALTA Take 120 mg by mouth daily.   EPINEPHrine 0.3 mg/0.3 mL Soaj injection Commonly known as: EPI-PEN Inject 0.3 mLs (  0.3 mg total) into the muscle as needed for anaphylaxis.   estradiol 1 MG tablet Commonly known as: ESTRACE TAKE 1 TABLET BY MOUTH ONCE A DAY   metFORMIN 500 MG 24 hr tablet Commonly known as: GLUCOPHAGE-XR Take 500 mg by mouth 2 (two) times daily.   multivitamin with minerals Tabs tablet Take 1 tablet by mouth daily.   oxyCODONE-acetaminophen 5-325 MG tablet Commonly known as: Percocet Take 1 tablet by mouth 4 times a day   QUEtiapine 400 MG tablet Commonly known as: SEROQUEL Take 400 mg by mouth at bedtime.   sucralfate 1 g tablet Commonly known as: CARAFATE Take 1 g by mouth 4 (four) times daily.   tiZANidine 4 MG tablet Commonly known as: Zanaflex Take 1 and 1/2 tablets by mouth 3 times a day as needed What changed:  how much to take how to take this when to take this reasons to take this   zolpidem 10 MG tablet Commonly known as: AMBIEN Take 10 mg by mouth at bedtime.         Follow-up Information     Dois Davenport, MD Follow up in 1 week(s).   Specialty: Family Medicine Contact information: [address] [place] [address] [place] [address] [phone]                Allergies  Allergen Reactions   Chlorquinaldol Rash    11/30/19 patch testing: positive reaction to Quinoline Mix (contains clioquinol and chlorquinaldol)   Clioquinol Rash    11/30/19 patch testing: positive reaction to Quinoline Mix (contains clioquinol and chlorquinaldol)    Other Anaphylaxis    Pt has anaphylactic reaction to something during procedure - unsure of what   Per patient, Quinoline Mix  - based on allergy testing   Latex Itching, Swelling and Dermatitis    SWELLING FROM FACE MASK RING AFTER LAST SURGERY    Lisinopril Cough        Codeine Nausea And Vomiting   Hydrocodone Itching   Tape Rash    TOLERATES PAPER TAPE ONLY NO PINK SURGICAL TAPE EITHER, EKG Leads    Wound Dressing Adhesive Rash    Dermabond    Consultations: GI-signed off   Procedures/Studies: CT ABD PELVIS W/WO CM ONCOLOGY PANCREATIC PROTOCOL  Result Date: 07/18/2023 CLINICAL DATA:  Abdominal pain, pancreatic cysts, new abdominal pain history of breast cancer * Tracking Code: BO * EXAM: CT ABDOMEN AND PELVIS WITHOUT AND WITH CONTRAST TECHNIQUE: Multidetector CT imaging of the abdomen and pelvis was performed following the standard protocol before and following the bolus administration of intravenous contrast. RADIATION DOSE REDUCTION: This exam was performed according to the departmental dose-optimization program which includes automated exposure control, adjustment of the mA and/or kV according to patient size and/or use of iterative reconstruction technique. CONTRAST:  OMNIPAQUE IOHEXOL 300 MG/ML  SOLN COMPARISON:  CT abdomen pelvis, 06/11/2023, MR abdomen, 09/19/2022 FINDINGS: Lower chest: No acute abnormality. Status post bilateral mastectomy with implant reconstruction Hepatobiliary: No  solid liver abnormality is seen. Large cyst of the central liver dome measuring 10.0 x 9.1 cm (series 19, image 38). Status post cholecystectomy. No biliary dilatation. Pancreas: Unchanged cystic lesion of the superior pancreatic head, previously characterized by MR. No pancreatic ductal dilatation or surrounding inflammatory changes. Spleen: Normal in size without significant abnormality. Adrenals/Urinary Tract: Definitively benign, macroscopic fat containing right adrenal adenoma, for which no further follow-up or characterization is required. Kidneys are normal, without renal calculi, solid lesion, or hydronephrosis. Stomach/Bowel: Stomach is  within normal limits. No evidence of bowel wall thickening, distention, or inflammatory changes. Vascular/Lymphatic: Aortic atherosclerosis. No enlarged abdominal lymph nodes. Reproductive: Status post hysterectomy. Simple, benign left ovarian cysts, for which no further follow-up or characterization is required Other: No abdominal wall hernia or abnormality. No ascites. Musculoskeletal: No acute or significant osseous findings. IMPRESSION: 1. No acute CT findings of the abdomen or pelvis to explain abdominal pain. 2. Unchanged cystic lesion of the superior pancreatic head, previously characterized by MR. No pancreatic ductal dilatation or surrounding inflammatory changes. This is stable over numerous prior examinations and is almost certainly benign, generally requiring no further follow-up or characterization 3. Large cyst of the central liver dome measuring 10.0 x 9.1 cm, benign, requiring no further follow-up or characterization. 4. Status post bilateral mastectomy with implant reconstruction. 5. Status post hysterectomy and cholecystectomy. Aortic Atherosclerosis (ICD10-I70.0). Electronically Signed   By: Jearld Lesch M.D.   On: 07/18/2023 20:21     Discharge Exam: Vitals:   07/21/23 0807 07/21/23 0810  BP: (!) 122/90 136/61  Pulse: 100 98  Resp:    Temp:     SpO2: 93% 94%   Vitals:   07/21/23 0803 07/21/23 0805 07/21/23 0807 07/21/23 0810  BP: 130/74 120/75 (!) 122/90 136/61  Pulse: 93 92 100 98  Resp:      Temp:      TempSrc:      SpO2: 90% 93% 93% 94%  Weight:      Height:        General: Pt is alert, awake, not in acute distress Cardiovascular: RRR, S1/S2 +, no rubs, no gallops Respiratory: CTA bilaterally, no wheezing, no rhonchi Abdominal: Soft, NT, ND, bowel sounds + Extremities: no edema, no cyanosis    The results of significant diagnostics from this hospitalization (including imaging, microbiology, ancillary and laboratory) are listed below for reference.     Microbiology: No results found for this or any previous visit (from the past 240 hour(s)).   Labs: BNP (last 3 results) No results for input(s): "BNP" in the last 8760 hours. Basic Metabolic Panel: Recent Labs  Lab 07/17/23 0823 07/18/23 0748 07/19/23 0714 07/20/23 0555  NA 137 137 136 137  K 3.0* 4.0 3.5 3.8  CL 102 104 107 106  CO2 21* 23 23 24   GLUCOSE 96 93 91 98  BUN 22* 14 11 12   CREATININE 2.26* 1.02* 0.79 0.71  CALCIUM 9.3 9.2 8.3* 8.0*   Liver Function Tests: Recent Labs  Lab 07/17/23 0823  AST 23  ALT 17  ALKPHOS 69  BILITOT 0.7  PROT 7.3  ALBUMIN 4.1   Recent Labs  Lab 07/17/23 0823  LIPASE 35   No results for input(s): "AMMONIA" in the last 168 hours. CBC: Recent Labs  Lab 07/17/23 0823 07/18/23 0748  WBC 9.0 5.7  NEUTROABS 5.3  --   HGB 13.6 14.2  HCT 41.3 43.7  MCV 91.6 92.4  PLT 294 232   Cardiac Enzymes: No results for input(s): "CKTOTAL", "CKMB", "CKMBINDEX", "TROPONINI" in the last 168 hours. BNP: Invalid input(s): "POCBNP" CBG: Recent Labs  Lab 07/17/23 1310 07/17/23 1708 07/17/23 2059 07/18/23 0731  GLUCAP 86 111* 126* 99   D-Dimer No results for input(s): "DDIMER" in the last 72 hours. Hgb A1c No results for input(s): "HGBA1C" in the last 72 hours. Lipid Profile No results for input(s):  "CHOL", "HDL", "LDLCALC", "TRIG", "CHOLHDL", "LDLDIRECT" in the last 72 hours. Thyroid function studies No results for input(s): "TSH", "T4TOTAL", "T3FREE", "THYROIDAB" in  the last 72 hours.  Invalid input(s): "FREET3" Anemia work up No results for input(s): "VITAMINB12", "FOLATE", "FERRITIN", "TIBC", "IRON", "RETICCTPCT" in the last 72 hours. Urinalysis    Component Value Date/Time   COLORURINE YELLOW 07/17/2023 1153   APPEARANCEUR CLOUDY (A) 07/17/2023 1153   LABSPEC 1.012 07/17/2023 1153   PHURINE 5.0 07/17/2023 1153   GLUCOSEU NEGATIVE 07/17/2023 1153   GLUCOSEU NEGATIVE 12/27/2019 1127   HGBUR NEGATIVE 07/17/2023 1153   BILIRUBINUR NEGATIVE 07/17/2023 1153   BILIRUBINUR neg 09/09/2017 1642   KETONESUR NEGATIVE 07/17/2023 1153   PROTEINUR NEGATIVE 07/17/2023 1153   UROBILINOGEN 0.2 12/27/2019 1127   NITRITE NEGATIVE 07/17/2023 1153   LEUKOCYTESUR NEGATIVE 07/17/2023 1153   Sepsis Labs Recent Labs  Lab 07/17/23 0823 07/18/23 0748  WBC 9.0 5.7   Microbiology No results found for this or any previous visit (from the past 240 hour(s)).  FURTHER DISCHARGE INSTRUCTIONS:   Get Medicines reviewed and adjusted: Please take all your medications with you for your next visit with your Primary MD   Laboratory/radiological data: Please request your Primary MD to go over all hospital tests and procedure/radiological results at the follow up, please ask your Primary MD to get all Hospital records sent to his/her office.   In some cases, they will be blood work, cultures and biopsy results pending at the time of your discharge. Please request that your primary care M.D. goes through all the records of your hospital data and follows up on these results.   Also Note the following: If you experience worsening of your admission symptoms, develop shortness of breath, life threatening emergency, suicidal or homicidal thoughts you must seek medical attention immediately by calling 911 or  calling your MD immediately  if symptoms less severe.   You must read complete instructions/literature along with all the possible adverse reactions/side effects for all the Medicines you take and that have been prescribed to you. Take any new Medicines after you have completely understood and accpet all the possible adverse reactions/side effects.    Do not drive when taking Pain medications or sleeping medications (Benzodaizepines)   Do not take more than prescribed Pain, Sleep and Anxiety Medications. It is not advisable to combine anxiety,sleep and pain medications without talking with your primary care practitioner   Special Instructions: If you have smoked or chewed Tobacco  in the last 2 yrs please stop smoking, stop any regular Alcohol  and or any Recreational drug use.   Wear Seat belts while driving.   Please note: You were cared for by a hospitalist during your hospital stay. Once you are discharged, your primary care physician will handle any further medical issues. Please note that NO REFILLS for any discharge medications will be authorized once you are discharged, as it is imperative that you return to your primary care physician (or establish a relationship with a primary care physician if you do not have one) for your post hospital discharge needs so that they can reassess your need for medications and monitor your lab values  Time coordinating discharge: Over 30 minutes  SIGNED:   Hughie Closs, MD  [facility] 07/21/2023, 10:37 AM *Please note that this is a verbal dictation therefore any spelling or grammatical errors are due to the "[employer]agon [employer] One" system interpretation. If 7PM-7AM, please contact night-coverage [email or web address]

## 2023-07-21 NOTE — Progress Notes (Addendum)
Encouraged patient to keep HOB up at least 30 degrees to help with her reflux symptoms.  Also helps with oxygen saturation (90% flat in bed, 94% upright).  She does not like water, only drinks juice.

## 2023-07-31 NOTE — Telephone Encounter (Signed)
[facility] called stating patient request Rx Refill for Estradiol. CMA spoke to Dr. Shawnie Pons. Provider approved for the refill.   Felecia Shelling [facility] 07/31/2023

## 2023-08-09 ENCOUNTER — Telehealth: Payer: Self-pay | Admitting: *Deleted

## 2023-08-09 NOTE — Telephone Encounter (Signed)
Yesi,   This pt is a documented difficult intubation and his procedure will need to be done at the hospital.   Thanks,  Ronnette Rump 

## 2023-08-10 ENCOUNTER — Other Ambulatory Visit (HOSPITAL_BASED_OUTPATIENT_CLINIC_OR_DEPARTMENT_OTHER): Payer: Self-pay | Admitting: Family Medicine

## 2023-08-10 ENCOUNTER — Telehealth: Payer: Self-pay

## 2023-08-10 DIAGNOSIS — N184 Chronic kidney disease, stage 4 (severe): Secondary | ICD-10-CM

## 2023-08-10 DIAGNOSIS — I10 Essential (primary) hypertension: Secondary | ICD-10-CM

## 2023-08-10 NOTE — Telephone Encounter (Signed)
Discussed Dr Lamar Sprinkles recommendations with the patient. Initially, scheduled patient for follow up in the office with Dr Marina Goodell on his first available date, 09/21/23. Unfortunately, patient states that she is changing insurances and needs to be seen before the end of the year; therefore, I have attempted to accommodate this need by scheduling her to see Doug Sou, PA-C 08/18/23 to further discuss procedures.   Patient continues to express concern and asks that we "pencil in" an appointment for procedures at the hospital so she can get these done before the end of the year. I have advised that unfortunately, the next availability to possibly schedule hospital procedure with Dr Marina Goodell would be 09/23/23. Patient describes multiple GI concerns, stating that she has been losing weight, has a "spot on the liver" etc. Mentions that her mother had pancreatic cancer which is also a concern for her.   I advised that she discuss these concerns with Shanda Bumps at her upcoming visit 08/18/23. Advised that we will make recommendations at that time. Patient verbalizes understanding.

## 2023-08-10 NOTE — Telephone Encounter (Signed)
Noted.  Agree with office assessment. Thanks

## 2023-08-10 NOTE — Telephone Encounter (Signed)
Melissa Allen, Above-noted.  In terms of GI problems, she was doing better at the time of her hospital discharge please reach out to the patient. Have her come in to see me in the office in the next month or so. I would like to reassess her and we can determine the appropriateness of procedures and their location. Thanks, Dr. Marina Goodell

## 2023-08-10 NOTE — Telephone Encounter (Signed)
Patient was scheduled for endo/colon on 08/24/23 and pv on 08/13/23.  Both have been cancelled per Jonny Ruiz Nulty's TE on 08/09/23 stating she is difficult intubation and will need to be done at the hospital..  Patient is aware that both will be cancelled and patient needs to be scheduled with Dr. Marina Goodell at the hospital. Patient requested to be called to reschedule due to possible conflicts, as she had already made arrangements for these dates.

## 2023-08-18 ENCOUNTER — Encounter: Payer: Self-pay | Admitting: Gastroenterology

## 2023-08-18 ENCOUNTER — Ambulatory Visit: Payer: BC Managed Care – PPO | Admitting: Gastroenterology

## 2023-08-18 VITALS — BP 110/64 | HR 134 | Ht 64.0 in | Wt 168.0 lb

## 2023-08-18 DIAGNOSIS — R1011 Right upper quadrant pain: Secondary | ICD-10-CM

## 2023-08-18 DIAGNOSIS — R197 Diarrhea, unspecified: Secondary | ICD-10-CM | POA: Diagnosis not present

## 2023-08-18 DIAGNOSIS — K862 Cyst of pancreas: Secondary | ICD-10-CM | POA: Diagnosis not present

## 2023-08-18 DIAGNOSIS — K7689 Other specified diseases of liver: Secondary | ICD-10-CM

## 2023-08-18 DIAGNOSIS — R1013 Epigastric pain: Secondary | ICD-10-CM

## 2023-08-18 MED ORDER — DICYCLOMINE HCL 10 MG PO CAPS: 10.0000 mg | ORAL_CAPSULE | Freq: Two times a day (BID) | ORAL | 2 refills | Status: DC

## 2023-08-18 NOTE — Progress Notes (Unsigned)
08/18/2023 AZILE ANTONOVICH [id number] [birth year]   HISTORY OF PRESENT ILLNESS: ***         Past Medical History:  Diagnosis Date   Allergy    Anginal pain (HCC)    one episode 4-5 years ago-[facility] [place]-nonspecific-no problems since-   Anxiety    Arthritis    multiple areas of arthritis- DDD   Asthma    recent admit to ER 01/11/12 for exac of asthma   Calcaneal fracture    Cancer (HCC)    Duplicated ureter, right    Family history of breast cancer    Family history of colon cancer    Family history of ovarian cancer    Family history of pancreatic cancer    Fibromyalgia    History of kidney stones    Hypertension    Obesity (BMI 30.0-34.9)    Pancreatitis 1986   Pneumonia    PONV (postoperative nausea and vomiting)    Poor venous access    hard to start iv-   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, was treated /w phenobarbital for a while, then taken off - 02/24/2020 occurred 30 years ago per patient   Shortness of breath dyspnea    Wears glasses    Past Surgical History:  Procedure Laterality Date   ABDOMINAL HYSTERECTOMY     ANTERIOR LAT LUMBAR FUSION Left 08/17/2018   Procedure: LUMBAR THREE-FOUR ANTERIOR LATERAL INTERBODY FUSION;  Surgeon: Julio Sicks, MD;  Location: [facility];  Service: Neurosurgery;  Laterality: Left;   APPENDECTOMY  08/05/2017   BLADDER SUSPENSION  03/01/2012   Procedure: TRANSVAGINAL TAPE (TVT) PROCEDURE;  Surgeon: Allie Bossier, MD;  Location: [facility];  Service: Gynecology;  Laterality: N/A;   BREAST CAPSULECTOMY WITH IMPLANT EXCHANGE Bilateral 04/10/2023   Procedure: BREAST CAPSULORRHAPHY WITH SILICONE IMPLANT EXCHANGE;  Surgeon: Glenna Fellows, MD;  Location: [facility];  Service: Plastics;  Laterality: Bilateral;   BREAST RECONSTRUCTION WITH PLACEMENT OF TISSUE EXPANDER AND ALLODERM Bilateral 10/19/2020   Procedure: BILATERAL BREAST RECONSTRUCTION WITH PLACEMENT OF TISSUE EXPANDER AND ALLODERM;  Surgeon:  Glenna Fellows, MD;  Location: [facility];  Service: Plastics;  Laterality: Bilateral;   BUNIONECTOMY  1995   Right   CHOLECYSTECTOMY  late 1980's       CYSTOSCOPY  03/01/2012   Procedure: CYSTOSCOPY;  Surgeon: Allie Bossier, MD;  Location: [facility];  Service: Gynecology;  Laterality: N/A;   CYSTOSCOPY W/ URETERAL STENT PLACEMENT Left 12/15/2014   Procedure: CYSTOSCOPY, LEFT URETEROSCOPYU WITH STONE EXTRACTION;  Surgeon: Bjorn Pippin, MD;  Location: [facility];  Service: Urology;  Laterality: Left;   CYSTOSCOPY W/ URETERAL STENT PLACEMENT Left 10/02/2016   Procedure: CYSTOSCOPY WITH RETROGRADE PYELOGRAM/URETERAL STENT PLACEMENT ureteroscopy, basket extraction;  Surgeon: Malen Gauze, MD;  Location: [facility];  Service: Urology;  Laterality: Left;   HARDWARE REMOVAL N/A 12/22/2016   Procedure: Lumbar Four-Five Removal of Hardware;  Surgeon: Julio Sicks, MD;  [facility];  Service: Neurosurgery;  Laterality: N/A;   INCISION AND DRAINAGE ABSCESS Left 07/19/2013   Procedure: INCISION AND DRAINAGE LEFT LOWER EXTERMITY HEMATOMA;  Surgeon: Wilmon Arms. Corliss Skains, MD;  Location: [facility];  Service: General;  Laterality: Left;  Excision left lower leg mass   KNEE CARTILAGE SURGERY     left   KNEE SURGERY Left    x 2, Murphy/Sue, corrected Patella displacement    LAMINECTOMY     LAPAROSCOPIC APPENDECTOMY N/A 08/05/2017  Procedure: APPENDECTOMY LAPAROSCOPIC;  Surgeon: Kinsinger, De Blanch, MD;  Location: [facility];  Service: General;  Laterality: N/A;   LIPOSUCTION Bilateral 04/10/2023   Procedure: BILATERAL CHEST WALLS LIPOSUCTION;  Surgeon: Glenna Fellows, MD;  Location: [facility];  Service: Plastics;  Laterality: Bilateral;   LUMBAR LAMINECTOMY  06/2005   L4-5, Ray   LUMBAR PERCUTANEOUS PEDICLE SCREW 1 LEVEL Left 08/17/2018   Procedure: LUMBAR THREE-FOUR LUMBAR PERCUTANEOUS PEDICLE SCREW;  Surgeon: Julio Sicks, MD;  Location[facility];  Service: Neurosurgery;   Laterality: Left;   MASTECTOMY W/ SENTINEL NODE BIOPSY Bilateral 10/19/2020   Procedure: BILATERAL MASTECTOMY WITH LEFT SENTINEL LYMPH NODE BIOPSY;  Surgeon: Griselda Miner, MD;  Location: [facility];  Service: General;  Laterality: Bilateral;   POSTERIOR CERVICAL LAMINECTOMY Right 02/28/2020   Procedure: Right Cervical Six-Seven Laminectomy and Foraminotomy;  Surgeon: Julio Sicks, MD;  Location: [facility];  Service: Neurosurgery;  Laterality: Right;  3C   R compressed pronator Right    Sypher, R arm   REMOVAL OF BILATERAL TISSUE EXPANDERS WITH PLACEMENT OF BILATERAL BREAST IMPLANTS Bilateral 01/28/2021   Procedure: REMOVAL OF BILATERAL TISSUE EXPANDERS WITH PLACEMENT OF BILATERAL BREAST IMPLANTS WITH ACELLULAR DERMIS;  Surgeon: Glenna Fellows, MD;  Location: [facility];  Service: Plastics;  Laterality: Bilateral;   RESECTION DISTAL CLAVICAL Right 06/30/2017   Procedure: RIGHT RESECTION DISTAL CLAVICAL;  Surgeon: Marcene Corning, MD;  Location: [facility];  Service: Orthopedics;  Laterality: Right;   SHOULDER ACROMIOPLASTY Right 06/30/2017   Procedure: RIGHT SHOULDER ACROMIOPLASTY;  Surgeon: Marcene Corning, MD;  Location: [facility];  Service: Orthopedics;  Laterality: Right;   SHOULDER ARTHROSCOPY Right 06/30/2017   Procedure: ARTHROSCOPY DEBRIDEMENT RIGHT SHOULDER;  Surgeon: Marcene Corning, MD;  Location: [facility];  Service: Orthopedics;  Laterality: Right;   SPINAL FUSION     TENDON REPAIR  2013   TENDON REPAIR Right 02/01/2012   TONSILLECTOMY  age 57   TOTAL KNEE ARTHROPLASTY Left 08/12/2022   Procedure: LEFT TOTAL KNEE ARTHROPLASTY;  Surgeon: Marcene Corning, MD;  Location: [facility];  Service: Orthopedics;  Laterality: Left;   TOTAL SHOULDER ARTHROPLASTY Right 08/22/2021   Procedure: TOTAL SHOULDER ARTHROPLASTY;  Surgeon: Jones Broom, MD;  Location: [facility];  Service: Orthopedics;  Laterality: Right;    reports that she has never smoked. She has never used smokeless  tobacco. She reports that she does not currently use alcohol. She reports that she does not use drugs. family history includes Breast cancer in her maternal grandmother and another family member; Cancer in her mother; Colon cancer in her paternal grandfather; Colon polyps in her father; Dementia in her father; Diabetes in an other family member; Heart disease (age of onset: 36) in her father; Leukemia in her father; Ovarian cancer in an other family member; Ovarian cancer (age of onset: 18) in her maternal grandmother; Uterine cancer in an other family member. Allergies  Allergen Reactions   Chlorquinaldol Rash    11/30/19 patch testing: positive reaction to Quinoline Mix (contains clioquinol and chlorquinaldol)   Clioquinol Rash    11/30/19 patch testing: positive reaction to Quinoline Mix (contains clioquinol and chlorquinaldol)    Other Anaphylaxis    Pt has anaphylactic reaction to something during procedure - unsure of what   Per patient, Quinoline Mix  - based on allergy testing   Latex Itching, Swelling and Dermatitis    SWELLING FROM FACE MASK RING AFTER LAST SURGERY    Lisinopril Cough  Codeine Nausea And Vomiting   Hydrocodone Itching   Tape Rash    TOLERATES PAPER TAPE ONLY NO PINK SURGICAL TAPE EITHER, EKG Leads    Wound Dressing Adhesive Rash    Dermabond      Outpatient Encounter Medications as of 08/18/2023  Medication Sig   albuterol (PROAIR HFA) 108 (90 Base) MCG/ACT inhaler INHALE TWO PUFFS EVERY FOUR HOURS AS NEEDED SHORTNESS OF BREATH (Patient taking differently: Inhale 2 puffs into the lungs every 6 (six) hours as needed for shortness of breath or wheezing.)   Dextromethorphan-buPROPion ER (AUVELITY) 45-105 MG TBCR Take 1 tablet by mouth in the morning and at bedtime.   doxepin (SINEQUAN) 25 MG capsule Take 25 mg by mouth at bedtime.   DULoxetine (CYMBALTA) 60 MG capsule Take 120 mg by mouth daily.   EPINEPHrine 0.3 mg/0.3 mL IJ SOAJ injection Inject 0.3 mLs  (0.3 mg total) into the muscle as needed for anaphylaxis.   estradiol (ESTRACE) 1 MG tablet TAKE ONE TABLET BY MOUTH ONCE A DAY   metFORMIN (GLUCOPHAGE-XR) 500 MG 24 hr tablet Take 500 mg by mouth 2 (two) times daily.   Multiple Vitamin (MULTIVITAMIN WITH MINERALS) TABS tablet Take 1 tablet by mouth daily.   omeprazole (PRILOSEC) 40 MG capsule Take 1 capsule (40 mg total) by mouth 2 (two) times daily for 28 days.   oxyCODONE-acetaminophen (PERCOCET) 5-325 MG tablet Take 1 tablet by mouth 4 times a day   QUEtiapine (SEROQUEL) 400 MG tablet Take 400 mg by mouth at bedtime.   sucralfate (CARAFATE) 1 g tablet Take 1 g by mouth 4 (four) times daily.   tiZANidine (ZANAFLEX) 4 MG tablet Take 1 and 1/2 tablets by mouth 3 times a day as needed (Patient taking differently: Take 6 mg by mouth 3 (three) times daily as needed for muscle spasms.)   zolpidem (AMBIEN) 10 MG tablet Take 10 mg by mouth at bedtime.   No facility-administered encounter medications on file as of 08/18/2023.    REVIEW OF SYSTEMS  : All other systems reviewed and negative except where noted in the History of Present Illness.   PHYSICAL EXAM: BP 110/64   Pulse (!) 134   Ht 5\' 4"  (1.626 m)   Wt 168 lb (76.2 kg)   LMP 12/07/2011   BMI 28.84 kg/m  General: Well developed white female in no acute distress Head: Normocephalic and atraumatic Eyes:  sclerae anicteric,conjunctive pink. Ears: Normal auditory acuity Neck: Supple, no masses.  Lungs: Clear throughout to auscultation Heart: Regular rate and rhythm Abdomen: Soft, nontender, non distended. No masses or hepatomegaly noted. Normal bowel sounds Rectal: *** Musculoskeletal: Symmetrical with no gross deformities  Skin: No lesions on visible extremities Extremities: No edema  Neurological: Alert oriented x 4, grossly nonfocal Cervical Nodes:  No significant cervical adenopathy Psychological:  Alert and cooperative. Normal mood and affect  ASSESSMENT AND PLAN:    CC:   Dois Davenport, MD

## 2023-08-18 NOTE — Patient Instructions (Signed)
We have sent the following medications to your pharmacy for you to pick up at your convenience: Dicyclomine 10 mg twice daily.   Will call with hospital date.   _______________________________________________________  If your blood pressure at your visit was 140/90 or greater, please contact your primary care physician to follow up on this.  _______________________________________________________  If you are age 58 or older, your body mass index should be between 23-30. Your Body mass index is 28.84 kg/m. If this is out of the aforementioned range listed, please consider follow up with your Primary Care Provider.  If you are age 21 or younger, your body mass index should be between 19-25. Your Body mass index is 28.84 kg/m. If this is out of the aformentioned range listed, please consider follow up with your Primary Care Provider.   ________________________________________________________  The [facility] providers would like to encourage you to use [facility] to communicate with providers for non-urgent requests or questions.  Due to long hold times on the telephone, sending your provider a message by [facility] may be a faster and more efficient way to get a response.  Please allow 48 business hours for a response.  Please remember that this is for non-urgent requests.  _______________________________________________________

## 2023-08-19 MED ORDER — NA SULFATE-K SULFATE-MG SULF 17.5-3.13-1.6 GM/177ML PO SOLN: 1.0000 | Freq: Once | ORAL | 0 refills | Status: AC

## 2023-08-20 ENCOUNTER — Encounter: Payer: Self-pay | Admitting: Gastroenterology

## 2023-08-20 DIAGNOSIS — R1011 Right upper quadrant pain: Secondary | ICD-10-CM | POA: Insufficient documentation

## 2023-08-20 DIAGNOSIS — R1013 Epigastric pain: Secondary | ICD-10-CM | POA: Insufficient documentation

## 2023-08-20 NOTE — Progress Notes (Signed)
Noted. EGD with small bowel biopsies to evaluate complaints of pain and diarrhea Colonoscopy biopsies to evaluate diarrhea. Appreciate Dr. Frankey Shown availability. She can follow-up in the clinic with me thereafter.

## 2023-08-24 ENCOUNTER — Encounter: Payer: BC Managed Care – PPO | Admitting: Internal Medicine

## 2023-08-28 ENCOUNTER — Ambulatory Visit (HOSPITAL_COMMUNITY)
Admission: RE | Admit: 2023-08-28 | Discharge: 2023-08-28 | Disposition: A | Discharge status: Home / Self Care | Payer: BC Managed Care – PPO | Source: Ambulatory Visit | Attending: Cardiovascular Disease | Admitting: Cardiovascular Disease

## 2023-08-28 DIAGNOSIS — N184 Chronic kidney disease, stage 4 (severe): Secondary | ICD-10-CM | POA: Diagnosis present

## 2023-08-28 DIAGNOSIS — I10 Essential (primary) hypertension: Secondary | ICD-10-CM | POA: Diagnosis present

## 2023-09-11 ENCOUNTER — Encounter (HOSPITAL_COMMUNITY): Payer: Self-pay | Admitting: Gastroenterology

## 2023-09-11 NOTE — Progress Notes (Signed)
 Attempted to obtain medical history for pre op call via telephone, unable to reach at this time. HIPAA compliant voicemail message left requesting return call to pre surgical testing department.

## 2023-09-21 ENCOUNTER — Ambulatory Visit: Payer: BC Managed Care – PPO | Admitting: Internal Medicine

## 2023-09-21 NOTE — Anesthesia Preprocedure Evaluation (Signed)
 Anesthesia Evaluation  Patient identified by MRN, date of birth, ID band Patient awake    Reviewed: Allergy  & Precautions, H&P , NPO status , Patient's Chart, lab work & pertinent test results  History of Anesthesia Complications (+) history of anesthetic complications  Airway Mallampati: II       Dental no notable dental hx.    Pulmonary shortness of breath, asthma , COPD   Pulmonary exam normal        Cardiovascular hypertension, Pt. on medications Normal cardiovascular exam     Neuro/Psych  Headaches, Seizures -,  PSYCHIATRIC DISORDERS Anxiety Depression       GI/Hepatic Neg liver ROS,GERD  ,,  Endo/Other  negative endocrine ROS    Renal/GU Renal disease  negative genitourinary   Musculoskeletal  (+) Arthritis , Osteoarthritis,  Fibromyalgia -  Abdominal  (+) + obese  Peds negative pediatric ROS (+)  Hematology negative hematology ROS (+)   Anesthesia Other Findings   Reproductive/Obstetrics negative OB ROS                              Anesthesia Physical Anesthesia Plan  ASA: 3  Anesthesia Plan: MAC   Post-op Pain Management:    Induction: Intravenous  PONV Risk Score and Plan: Propofol  infusion and Treatment may vary due to age or medical condition  Airway Management Planned: Natural Airway  Additional Equipment:   Intra-op Plan:   Post-operative Plan:   Informed Consent: I have reviewed the patients History and Physical, chart, labs and discussed the procedure including the risks, benefits and alternatives for the proposed anesthesia with the patient or authorized representative who has indicated his/her understanding and acceptance.     Dental advisory given  Plan Discussed with: CRNA  Anesthesia Plan Comments:         Anesthesia Quick Evaluation

## 2023-09-22 ENCOUNTER — Ambulatory Visit (HOSPITAL_COMMUNITY): Payer: Self-pay

## 2023-09-22 ENCOUNTER — Other Ambulatory Visit: Payer: Self-pay

## 2023-09-22 ENCOUNTER — Encounter (HOSPITAL_COMMUNITY): Payer: Self-pay | Admitting: Gastroenterology

## 2023-09-22 ENCOUNTER — Encounter (HOSPITAL_COMMUNITY)
Admission: RE | Disposition: A | Discharge status: Home / Self Care | Payer: Self-pay | Source: Home / Self Care | Attending: Gastroenterology

## 2023-09-22 ENCOUNTER — Ambulatory Visit (HOSPITAL_COMMUNITY)
Admission: RE | Admit: 2023-09-22 | Discharge: 2023-09-22 | Disposition: A | Discharge status: Home / Self Care | Payer: 59 | Attending: Gastroenterology | Admitting: Gastroenterology

## 2023-09-22 DIAGNOSIS — K3189 Other diseases of stomach and duodenum: Secondary | ICD-10-CM | POA: Diagnosis not present

## 2023-09-22 DIAGNOSIS — K219 Gastro-esophageal reflux disease without esophagitis: Secondary | ICD-10-CM | POA: Diagnosis not present

## 2023-09-22 DIAGNOSIS — R1110 Vomiting, unspecified: Secondary | ICD-10-CM | POA: Diagnosis not present

## 2023-09-22 DIAGNOSIS — I10 Essential (primary) hypertension: Secondary | ICD-10-CM

## 2023-09-22 DIAGNOSIS — K7689 Other specified diseases of liver: Secondary | ICD-10-CM | POA: Diagnosis not present

## 2023-09-22 DIAGNOSIS — K317 Polyp of stomach and duodenum: Secondary | ICD-10-CM | POA: Insufficient documentation

## 2023-09-22 DIAGNOSIS — R1013 Epigastric pain: Secondary | ICD-10-CM | POA: Diagnosis present

## 2023-09-22 DIAGNOSIS — J4489 Other specified chronic obstructive pulmonary disease: Secondary | ICD-10-CM | POA: Insufficient documentation

## 2023-09-22 DIAGNOSIS — R197 Diarrhea, unspecified: Secondary | ICD-10-CM

## 2023-09-22 DIAGNOSIS — K2970 Gastritis, unspecified, without bleeding: Secondary | ICD-10-CM

## 2023-09-22 DIAGNOSIS — F418 Other specified anxiety disorders: Secondary | ICD-10-CM | POA: Insufficient documentation

## 2023-09-22 DIAGNOSIS — R1011 Right upper quadrant pain: Secondary | ICD-10-CM | POA: Insufficient documentation

## 2023-09-22 HISTORY — PX: ESOPHAGOGASTRODUODENOSCOPY (EGD) WITH PROPOFOL: SHX5813

## 2023-09-22 HISTORY — PX: HEMOSTASIS CLIP PLACEMENT: SHX6857

## 2023-09-22 HISTORY — PX: BIOPSY: SHX5522

## 2023-09-22 HISTORY — PX: POLYPECTOMY: SHX5525

## 2023-09-22 HISTORY — PX: COLONOSCOPY WITH PROPOFOL: SHX5780

## 2023-09-22 LAB — GLUCOSE, CAPILLARY: Glucose-Capillary: 97 mg/dL (ref 70–99)

## 2023-09-22 SURGERY — COLONOSCOPY WITH PROPOFOL
Anesthesia: Monitor Anesthesia Care

## 2023-09-22 MED ORDER — PROPOFOL 10 MG/ML IV BOLUS
INTRAVENOUS | Status: AC
  Filled 2023-09-22: qty 20

## 2023-09-22 MED ORDER — MIDAZOLAM HCL 2 MG/2ML IJ SOLN
INTRAMUSCULAR | Status: DC | PRN
  Administered 2023-09-22: 2 mg via INTRAVENOUS

## 2023-09-22 MED ORDER — PHENYLEPHRINE HCL (PRESSORS) 10 MG/ML IV SOLN
INTRAVENOUS | Status: DC | PRN
  Administered 2023-09-22: 160 ug via INTRAVENOUS
  Administered 2023-09-22: 80 ug via INTRAVENOUS

## 2023-09-22 MED ORDER — SODIUM CHLORIDE 0.9 % IV SOLN: INTRAVENOUS | Status: DC

## 2023-09-22 MED ORDER — PROPOFOL 10 MG/ML IV BOLUS
INTRAVENOUS | Status: DC | PRN
  Administered 2023-09-22: 30 mg via INTRAVENOUS
  Administered 2023-09-22 (×2): 50 mg via INTRAVENOUS

## 2023-09-22 MED ORDER — MIDAZOLAM HCL 2 MG/2ML IJ SOLN
INTRAMUSCULAR | Status: AC
Start: 2023-09-22 — End: ?
  Filled 2023-09-22: qty 2

## 2023-09-22 MED ORDER — DEXMEDETOMIDINE HCL IN NACL 80 MCG/20ML IV SOLN
INTRAVENOUS | Status: DC | PRN
  Administered 2023-09-22: 8 ug via INTRAVENOUS

## 2023-09-22 MED ORDER — LIDOCAINE 2% (20 MG/ML) 5 ML SYRINGE
INTRAMUSCULAR | Status: DC | PRN
  Administered 2023-09-22: 100 mg via INTRAVENOUS

## 2023-09-22 MED ORDER — PROPOFOL 500 MG/50ML IV EMUL
INTRAVENOUS | Status: DC | PRN
  Administered 2023-09-22: 150 ug/kg/min via INTRAVENOUS

## 2023-09-22 SURGICAL SUPPLY — 24 items
BLOCK BITE 60FR ADLT L/F BLUE (MISCELLANEOUS) ×2 IMPLANT
ELECT REM PT RETURN 9FT ADLT (ELECTROSURGICAL)
ELECTRODE REM PT RTRN 9FT ADLT (ELECTROSURGICAL) IMPLANT
FCP BXJMBJMB 240X2.8X (CUTTING FORCEPS)
FLOOR PAD 36X40 (MISCELLANEOUS) ×2
FORCEP RJ3 GP 1.8X160 W-NEEDLE (CUTTING FORCEPS) IMPLANT
FORCEPS BIOP RAD 4 LRG CAP 4 (CUTTING FORCEPS) IMPLANT
FORCEPS BIOP RJ4 240 W/NDL (CUTTING FORCEPS)
FORCEPS BXJMBJMB 240X2.8X (CUTTING FORCEPS) IMPLANT
INJECTOR/SNARE I SNARE (MISCELLANEOUS) IMPLANT
LUBRICANT JELLY 4.5OZ STERILE (MISCELLANEOUS) IMPLANT
MANIFOLD NEPTUNE II (INSTRUMENTS) IMPLANT
NDL SCLEROTHERAPY 25GX240 (NEEDLE) IMPLANT
NEEDLE SCLEROTHERAPY 25GX240 (NEEDLE)
PAD FLOOR 36X40 (MISCELLANEOUS) ×2 IMPLANT
PROBE APC STR FIRE (PROBE) IMPLANT
PROBE INJECTION GOLD 7FR (MISCELLANEOUS) IMPLANT
SNARE ROTATE MED OVAL 20MM (MISCELLANEOUS) IMPLANT
SNARE SHORT THROW 13M SML OVAL (MISCELLANEOUS) IMPLANT
SYR 50ML LL SCALE MARK (SYRINGE) IMPLANT
TRAP SPECIMEN MUCOUS 40CC (MISCELLANEOUS) IMPLANT
TUBING ENDO SMARTCAP PENTAX (MISCELLANEOUS) ×4 IMPLANT
TUBING IRRIGATION ENDOGATOR (MISCELLANEOUS) ×2 IMPLANT
WATER STERILE IRR 1000ML POUR (IV SOLUTION) IMPLANT

## 2023-09-22 NOTE — Anesthesia Postprocedure Evaluation (Signed)
 Anesthesia Post Note  Patient: Melissa Allen  Procedure(s) Performed: COLONOSCOPY WITH PROPOFOL  ESOPHAGOGASTRODUODENOSCOPY (EGD) WITH PROPOFOL  BIOPSY HEMOSTASIS CLIP PLACEMENT     Patient location during evaluation: Endoscopy Anesthesia Type: MAC Level of consciousness: awake and alert Pain management: pain level controlled Vital Signs Assessment: post-procedure vital signs reviewed and stable Respiratory status: spontaneous breathing, nonlabored ventilation, respiratory function stable and patient connected to nasal cannula oxygen  Cardiovascular status: stable and blood pressure returned to baseline Postop Assessment: no apparent nausea or vomiting Anesthetic complications: no   No notable events documented.  Last Vitals:  Vitals:   09/22/23 0900 09/22/23 0910  BP: (!) 97/41 92/65  Pulse: (!) 101 97  Resp: 17 14  Temp:    SpO2: 96% 96%    Last Pain:  Vitals:   09/22/23 0910  TempSrc:   PainSc: 6                  Thom JONELLE Peoples

## 2023-09-22 NOTE — Transfer of Care (Signed)
 Immediate Anesthesia Transfer of Care Note  Patient: Melissa Allen  Procedure(s) Performed: COLONOSCOPY WITH PROPOFOL  ESOPHAGOGASTRODUODENOSCOPY (EGD) WITH PROPOFOL  BIOPSY HEMOSTASIS CLIP PLACEMENT  Patient Location: Endoscopy Unit  Anesthesia Type:General  Level of Consciousness: awake, alert , and oriented  Airway & Oxygen  Therapy: Patient Spontanous Breathing and Patient connected to face mask oxygen   Post-op Assessment: Report given to RN and Post -op Vital signs reviewed and stable  Post vital signs: Reviewed and stable  Last Vitals:  Vitals Value Taken Time  BP 100/53 09/22/23 0856  Temp    Pulse 100 09/22/23 0856  Resp 17 09/22/23 0856  SpO2 100 % 09/22/23 0856  Vitals shown include unfiled device data.  Last Pain:  Vitals:   09/22/23 0709  TempSrc: Temporal  PainSc: 7          Complications: No notable events documented.

## 2023-09-22 NOTE — Op Note (Signed)
 [facility] Patient Name: Melissa Allen Procedure Date: 09/22/2023 MRN: [id number] Attending MD: Sandor Flatter , MD, [id number] Date of Birth: 05/15/1966 CSN: [id number] Age: 58 Admit Type: Outpatient Procedure:                Upper GI endoscopy Indications:              Epigastric abdominal pain, Abdominal pain in the                            right upper quadrant, Heartburn, Diarrhea,                            Regurgitation Providers:                Sandor Flatter, MD, Willy Hummer, RN, Corene Southgate, Technician Referring MD:              Medicines:                Monitored Anesthesia Care Complications:            No immediate complications. Estimated Blood Loss:     Estimated blood loss was minimal. Procedure:                Pre-Anesthesia Assessment:                           - Prior to the procedure, a History and Physical                            was performed, and patient medications and                            allergies were reviewed. The patient's tolerance of                            previous anesthesia was also reviewed. The risks                            and benefits of the procedure and the sedation                            options and risks were discussed with the patient.                            All questions were answered, and informed consent                            was obtained. Prior Anticoagulants: The patient has                            taken no anticoagulant or antiplatelet agents. ASA                            Grade Assessment: III -  A patient with severe                            systemic disease. After reviewing the risks and                            benefits, the patient was deemed in satisfactory                            condition to undergo the procedure.                           After obtaining informed consent, the endoscope was                            passed under direct vision.  Throughout the                            procedure, the patient's blood pressure, pulse, and                            oxygen  saturations were monitored continuously. The                            [id number]-[id number] ([id number]) Olympus endoscope was introduced                            through the mouth, and advanced to the second part                            of duodenum. The upper GI endoscopy was                            accomplished without difficulty. The patient                            tolerated the procedure well. Scope In: Scope Out: Findings:      The examined esophagus was normal.      The Z-line was regular and was found 35 cm from the incisors.      Minimal inflammation characterized by congestion (edema) and erythema       was found in the gastric fundus, in the gastric body and in the gastric       antrum. Biopsies were taken with a cold forceps for histology and       Helicobacter pylori testing. Estimated blood loss was minimal.      A few small sessile polyps with no bleeding and no stigmata of recent       bleeding were found in the gastric fundus and in the gastric body.       Several of these polyps were removed with a cold biopsy forceps for       histologic representative evaluation. Resection and retrieval were       complete. Estimated blood loss was minimal.      The examined duodenum was normal. Biopsies were taken with a cold  forceps for histology. Estimated blood loss was minimal. Impression:               - Normal esophagus.                           - Z-line regular, 35 cm from the incisors.                           - Gastritis. Biopsied.                           - A few gastric polyps. Resected and retrieved.                           - Normal examined duodenum. Biopsied. Moderate Sedation:      Not Applicable - Patient had care per Anesthesia. Recommendation:           - Patient has a contact number available for                             emergencies. The signs and symptoms of potential                            delayed complications were discussed with the                            patient. Return to normal activities tomorrow.                            Written discharge instructions were provided to the                            patient.                           - Resume previous diet.                           - Continue present medications.                           - Await pathology results.                           - Colonoscopy today. Procedure Code(s):        --- Professional ---                           [phone], Esophagogastroduodenoscopy, flexible,                            transoral; with biopsy, single or multiple Diagnosis Code(s):        --- Professional ---                           K29.70, Gastritis, unspecified, without bleeding  K31.7, Polyp of stomach and duodenum                           R10.13, Epigastric pain                           R10.11, Right upper quadrant pain                           R12, Heartburn                           R19.7, Diarrhea, unspecified                           R11.10, Vomiting, unspecified CPT copyright 2022 [employer]. All rights reserved. The codes documented in this report are preliminary and upon coder review may  be revised to meet current compliance requirements. Sandor Flatter, MD 09/22/2023 9:00:18 AM Number of Addenda: 0

## 2023-09-22 NOTE — Op Note (Signed)
 [facility] Patient Name: Melissa Allen Procedure Date: 09/22/2023 MRN: [id number] Attending MD: Sandor Flatter , MD, [id number] Date of Birth: 16-Aug-1966 CSN: [id number] Age: 58 Admit Type: Outpatient Procedure:                Colonoscopy Indications:              Epigastric abdominal pain, Change in bowel habits,                            Diarrhea Providers:                Sandor Flatter, MD, Willy Hummer, RN, Corene Southgate, Technician Referring MD:              Medicines:                Monitored Anesthesia Care Complications:            No immediate complications. Estimated Blood Loss:     Estimated blood loss was minimal. Procedure:                Pre-Anesthesia Assessment:                           - Prior to the procedure, a History and Physical                            was performed, and patient medications and                            allergies were reviewed. The patient's tolerance of                            previous anesthesia was also reviewed. The risks                            and benefits of the procedure and the sedation                            options and risks were discussed with the patient.                            All questions were answered, and informed consent                            was obtained. Prior Anticoagulants: The patient has                            taken no anticoagulant or antiplatelet agents. ASA                            Grade Assessment: III - A patient with severe                            systemic disease.  After reviewing the risks and                            benefits, the patient was deemed in satisfactory                            condition to undergo the procedure.                           After obtaining informed consent, the colonoscope                            was passed under direct vision. Throughout the                            procedure, the patient's blood  pressure, pulse, and                            oxygen  saturations were monitored continuously. The                            CF-HQ190L ([id number]) Olympus colonoscope was                            introduced through the anus and advanced to the the                            terminal ileum. The colonoscopy was performed                            without difficulty. The patient tolerated the                            procedure well. The quality of the bowel                            preparation was fair. The terminal ileum, ileocecal                            valve, appendiceal orifice, and rectum were                            photographed. Scope In: 8:24:15 AM Scope Out: 8:45:23 AM Scope Withdrawal Time: 0 hours 17 minutes 36 seconds  Total Procedure Duration: 0 hours 21 minutes 8 seconds  Findings:      The perianal and digital rectal examinations were normal.      A moderate amount of semi-solid stool was found scattered throughout the       colon, interfering with visualization. The colonoscope clogged several       times with food and stool debris. Lavage of the area was performed using       copious amounts of sterile water , resulting in incomplete clearance with       fair visualization.      The visualized mucosa was otherwise normal appearing throughout the  colon. Biopsies for histology were taken with a cold forceps from the       right colon and left colon for evaluation of microscopic colitis.       Estimated blood loss was minimal. There was mild, persistent oozing at       one of the biopsy sites in the ascending colon. For hemostasis, one       hemostatic clip was successfully placed (MR conditional). Clip       manufacturer: [employer]. There was no bleeding at the end of the       procedure.      The retroflexed view of the distal rectum and anal verge was normal and       showed no anal or rectal abnormalities.      The terminal ileum appeared  normal. Impression:               - Preparation of the colon was fair.                           - Stool in the entire examined colon.                           - Normal mucosa in the entire examined colon.                            Biopsied. Clip (MR conditional) was placed. Clip                            manufacturer: [employer].                           - The distal rectum and anal verge are normal on                            retroflexion view.                           - The examined portion of the ileum was normal. Moderate Sedation:      Not Applicable - Patient had care per Anesthesia. Recommendation:           - Patient has a contact number available for                            emergencies. The signs and symptoms of potential                            delayed complications were discussed with the                            patient. Return to normal activities tomorrow.                            Written discharge instructions were provided to the                            patient.                           -  Resume previous diet.                           - Continue present medications.                           - Await pathology results.                           - Repeat colonoscopy in 1 year for screening                            purposes due to suboptimal prep quality on this                            study. Recommend extended 2-day prep with repeat                            procedure.                           - Can follow-up with Harlene Mail or Dr. Abran in                            the GI office PRN. Procedure Code(s):        --- Professional ---                           [id number], Colonoscopy, flexible; with control of                            bleeding, any method Diagnosis Code(s):        --- Professional ---                           R10.13, Epigastric pain                           R19.4, Change in bowel habit                           R19.7,  Diarrhea, unspecified CPT copyright 2022 American Medical Association. All rights reserved. The codes documented in this report are preliminary and upon coder review may  be revised to meet current compliance requirements. Sandor Flatter, MD 09/22/2023 9:08:54 AM Number of Addenda: 0

## 2023-09-22 NOTE — H&P (Signed)
 GASTROENTEROLOGY PROCEDURE H&P NOTE   Primary Care Physician: Burney Darice CROME, MD    Reason for Procedure:  Diarrhea, abdominal pain, change in bowel habits, history of colon polyps, family history  Plan:    EGD, colonoscopy   Patient is appropriate for endoscopic procedure(s) at [facility].  The nature of the procedure, as well as the risks, benefits, and alternatives were carefully and thoroughly reviewed with the patient. Ample time for discussion and questions allowed. The patient understood, was satisfied, and agreed to proceed.     HPI: Melissa Allen is a 58 y.o. female who presents for EGD and colonoscopy for evaluation of diarrhea, change of bowel habits, abdominal pain.  Was last seen in the GI clinic by Harlene Zehr/Dr. Abran on 08/18/2023 and referred for EGD/colonoscopy today for evaluation of diarrhea, change in bowel habits, abdominal pain.  Procedure scheduled at [facility] due to history of difficult intubation.  Past Medical History:  Diagnosis Date   Allergy     Anginal pain (HCC)    one episode 4-5 years ago-[facility] [place]-nonspecific-no problems since-   Anxiety    Arthritis    multiple areas of arthritis- DDD   Asthma    recent admit to ER 01/11/12 for exac of asthma   Calcaneal fracture    Cancer (HCC)    Duplicated ureter, right    Family history of breast cancer    Family history of colon cancer    Family history of ovarian cancer    Family history of pancreatic cancer    Fibromyalgia    History of kidney stones    Hypertension    Obesity (BMI 30.0-34.9)    Pancreatitis 1986   Pneumonia    PONV (postoperative nausea and vomiting)    Poor venous access    hard to start iv-   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, was treated /w phenobarbital for a while, then taken off - 02/24/2020 occurred 30 years ago per patient   Shortness of breath dyspnea    Wears glasses     Past Surgical History:   Procedure Laterality Date   ABDOMINAL HYSTERECTOMY     ANTERIOR LAT LUMBAR FUSION Left 08/17/2018   Procedure: LUMBAR THREE-FOUR ANTERIOR LATERAL INTERBODY FUSION;  Surgeon: Louis Shove, MD;  Location: [facility];  Service: Neurosurgery;  Laterality: Left;   APPENDECTOMY  08/05/2017   BLADDER SUSPENSION  03/01/2012   Procedure: TRANSVAGINAL TAPE (TVT) PROCEDURE;  Surgeon: Harland JAYSON Birkenhead, MD;  Location: [facility];  Service: Gynecology;  Laterality: N/A;   BREAST CAPSULECTOMY WITH IMPLANT EXCHANGE Bilateral 04/10/2023   Procedure: BREAST CAPSULORRHAPHY WITH SILICONE IMPLANT EXCHANGE;  Surgeon: Arelia Filippo, MD;  Location: [facility];  Service: Plastics;  Laterality: Bilateral;   BREAST RECONSTRUCTION WITH PLACEMENT OF TISSUE EXPANDER AND ALLODERM Bilateral 10/19/2020   Procedure: BILATERAL BREAST RECONSTRUCTION WITH PLACEMENT OF TISSUE EXPANDER AND ALLODERM;  Surgeon: Arelia Filippo, MD;  Location: [facility];  Service: Plastics;  Laterality: Bilateral;   BUNIONECTOMY  1995   Right   CHOLECYSTECTOMY  late 1980's       CYSTOSCOPY  03/01/2012   Procedure: CYSTOSCOPY;  Surgeon: Harland JAYSON Birkenhead, MD;  Location: [facility];  Service: Gynecology;  Laterality: N/A;   CYSTOSCOPY W/ URETERAL STENT PLACEMENT Left 12/15/2014   Procedure: CYSTOSCOPY, LEFT URETEROSCOPYU WITH STONE EXTRACTION;  Surgeon: Norleen Seltzer, MD;  Location: [facility];  Service: Urology;  Laterality: Left;   CYSTOSCOPY W/  URETERAL STENT PLACEMENT Left 10/02/2016   Procedure: CYSTOSCOPY WITH RETROGRADE PYELOGRAM/URETERAL STENT PLACEMENT ureteroscopy, basket extraction;  Surgeon: Belvie LITTIE Clara, MD;  Location: [facility];  Service: Urology;  Laterality: Left;   HARDWARE REMOVAL N/A 12/22/2016   Procedure: Lumbar Four-Five Removal of Hardware;  Surgeon: Victory Gunnels, MD;  Location: [facility];  Service: Neurosurgery;  Laterality: N/A;   INCISION AND DRAINAGE ABSCESS Left 07/19/2013   Procedure: INCISION AND DRAINAGE LEFT LOWER  EXTERMITY HEMATOMA;  Surgeon: Donnice POUR. Belinda, MD;  Location: [facility];  Service: General;  Laterality: Left;  Excision left lower leg mass   KNEE CARTILAGE SURGERY     left   KNEE SURGERY Left    x 2, Murphy/Sue, corrected Patella displacement    LAMINECTOMY     LAPAROSCOPIC APPENDECTOMY N/A 08/05/2017   Procedure: APPENDECTOMY LAPAROSCOPIC;  Surgeon: Stevie Herlene Righter, MD;  Location: [facility];  Service: General;  Laterality: N/A;   LIPOSUCTION Bilateral 04/10/2023   Procedure: BILATERAL CHEST WALLS LIPOSUCTION;  Surgeon: Arelia Filippo, MD;  Location: [facility];  Service: Plastics;  Laterality: Bilateral;   LUMBAR LAMINECTOMY  06/2005   L4-5, Ray   LUMBAR PERCUTANEOUS PEDICLE SCREW 1 LEVEL Left 08/17/2018   Procedure: LUMBAR THREE-FOUR LUMBAR PERCUTANEOUS PEDICLE SCREW;  Surgeon: Gunnels Victory, MD;  Location: [facility];  Service: Neurosurgery;  Laterality: Left;   MASTECTOMY W/ SENTINEL NODE BIOPSY Bilateral 10/19/2020   Procedure: BILATERAL MASTECTOMY WITH LEFT SENTINEL LYMPH NODE BIOPSY;  Surgeon: Curvin Deward MOULD, MD;  Location: [facility];  Service: General;  Laterality: Bilateral;   POSTERIOR CERVICAL LAMINECTOMY Right 02/28/2020   Procedure: Right Cervical Six-Seven Laminectomy and Foraminotomy;  Surgeon: Gunnels Victory, MD;  Location: [facility];  Service: Neurosurgery;  Laterality: Right;  3C   R compressed pronator Right    Sypher, R arm   REMOVAL OF BILATERAL TISSUE EXPANDERS WITH PLACEMENT OF BILATERAL BREAST IMPLANTS Bilateral 01/28/2021   Procedure: REMOVAL OF BILATERAL TISSUE EXPANDERS WITH PLACEMENT OF BILATERAL BREAST IMPLANTS WITH ACELLULAR DERMIS;  Surgeon: Arelia Filippo, MD;  Location: [facility];  Service: Plastics;  Laterality: Bilateral;   RESECTION DISTAL CLAVICAL Right 06/30/2017   Procedure: RIGHT RESECTION DISTAL CLAVICAL;  Surgeon: Sheril Coy, MD;  Location: [facility];  Service: Orthopedics;  Laterality:  Right;   SHOULDER ACROMIOPLASTY Right 06/30/2017   Procedure: RIGHT SHOULDER ACROMIOPLASTY;  Surgeon: Sheril Coy, MD;  Location: [facility];  Service: Orthopedics;  Laterality: Right;   SHOULDER ARTHROSCOPY Right 06/30/2017   Procedure: ARTHROSCOPY DEBRIDEMENT RIGHT SHOULDER;  Surgeon: Sheril Coy, MD;  Location: [facility];  Service: Orthopedics;  Laterality: Right;   SPINAL FUSION     TENDON REPAIR  2013   TENDON REPAIR Right 02/01/2012   TONSILLECTOMY  age 5   TOTAL KNEE ARTHROPLASTY Left 08/12/2022   Procedure: LEFT TOTAL KNEE ARTHROPLASTY;  Surgeon: Sheril Coy, MD;  Location: [facility];  Service: Orthopedics;  Laterality: Left;   TOTAL SHOULDER ARTHROPLASTY Right 08/22/2021   Procedure: TOTAL SHOULDER ARTHROPLASTY;  Surgeon: Dozier Soulier, MD;  Location: [facility];  Service: Orthopedics;  Laterality: Right;    Prior to Admission medications   Medication Sig Start Date End Date Taking? Authorizing Provider  Dextromethorphan -buPROPion  ER (AUVELITY ) 45-105 MG TBCR Take 1 tablet by mouth in the morning and at bedtime.   Yes [provider]  doxepin  (SINEQUAN ) 25 MG capsule Take 25 mg by mouth at bedtime. 07/06/23  Yes [provider]  DULoxetine  (CYMBALTA ) 60 MG capsule  Take 120 mg by mouth daily. 05/22/23  Yes [provider]  estradiol  (ESTRACE ) 1 MG tablet TAKE ONE TABLET BY MOUTH ONCE A DAY 07/31/23  Yes Fredirick Glenys RAMAN, MD  hydrochlorothiazide  (HYDRODIURIL ) 25 MG tablet Take 25 mg by mouth daily.   Yes [provider]  losartan  (COZAAR ) 25 MG tablet Take 25 mg by mouth in the morning and at bedtime.   Yes [provider]  metFORMIN  (GLUCOPHAGE -XR) 500 MG 24 hr tablet Take 500 mg by mouth 2 (two) times daily.   Yes [provider]  Multiple Vitamin (MULTIVITAMIN WITH MINERALS) TABS tablet Take 1 tablet by mouth daily.   Yes [provider]  oxyCODONE -acetaminophen  (PERCOCET) 5-325 MG tablet Take 1 tablet by mouth 4 times a  day 11/06/22  Yes   QUEtiapine  (SEROQUEL ) 400 MG tablet Take 400 mg by mouth at bedtime.   Yes [provider]  tiZANidine  (ZANAFLEX ) 4 MG tablet Take 1 and 1/2 tablets by mouth 3 times a day as needed Patient taking differently: Take 6 mg by mouth 3 (three) times daily as needed for muscle spasms. 11/06/22  Yes   zolpidem  (AMBIEN ) 10 MG tablet Take 10 mg by mouth at bedtime.   Yes [provider]  albuterol  (PROAIR  HFA) 108 (90 Base) MCG/ACT inhaler INHALE TWO PUFFS EVERY FOUR HOURS AS NEEDED SHORTNESS OF BREATH Patient taking differently: Inhale 2 puffs into the lungs every 6 (six) hours as needed for shortness of breath or wheezing. 08/27/16   Norleen Lynwood ORN, MD  dicyclomine  (BENTYL ) 10 MG capsule Take 1 capsule (10 mg total) by mouth 2 (two) times daily. 08/18/23   Zehr, Jessica D, PA-C  EPINEPHrine  0.3 mg/0.3 mL IJ SOAJ injection Inject 0.3 mLs (0.3 mg total) into the muscle as needed for anaphylaxis. 10/04/19   Alvia Bring, DO  omeprazole  (PRILOSEC) 40 MG capsule Take 1 capsule (40 mg total) by mouth 2 (two) times daily for 28 days. 07/21/23 08/18/23  Vernon Ranks, MD  sucralfate  (CARAFATE ) 1 g tablet Take 1 g by mouth 4 (four) times daily. 07/06/23   [provider]    Current Facility-Administered Medications  Medication Dose Route Frequency Provider Last Rate Last Admin   0.9 %  sodium chloride  infusion   Intravenous Continuous Zehr, Jessica D, PA-C        Allergies as of 08/19/2023 - Review Complete 08/18/2023  Allergen Reaction Noted   Chlorquinaldol Rash 02/27/2020   Clioquinol Rash 02/27/2020   Other Anaphylaxis 02/17/2020   Latex Itching, Swelling, and Dermatitis 01/11/2012   Lisinopril Cough 12/07/2012   Codeine Nausea And Vomiting    Hydrocodone  Itching    Tape Rash 04/30/2015   Wound dressing adhesive Rash 05/27/2021    Family History  Problem Relation Age of Onset   Cancer Mother        pancreatic cancer   Heart disease Father 65    Colon polyps Father    Leukemia Father    Dementia Father    Breast cancer Maternal Grandmother    Ovarian cancer Maternal Grandmother 45   Colon cancer Paternal Grandfather        dx < 50   Uterine cancer Other        Grandmother   Breast cancer Other        Grandmother   Ovarian cancer Other        Grandmother   Diabetes Other        mother   Other Neg Hx  Social History   Socioeconomic History   Marital status: Married    Spouse name: Not on file   Number of children: 3   Years of education: Not on file   Highest education level: Not on file  Occupational History   Occupation: TEACHER    Employer: [employer]    Comment: [employer] (6th grade)  Tobacco Use   Smoking status: Never   Smokeless tobacco: Never  Vaping Use   Vaping status: Never Used  Substance and Sexual Activity   Alcohol use: Not Currently   Drug use: No   Sexual activity: Yes    Birth control/protection: Surgical  Other Topics Concern   Not on file  Social History Narrative   Caffeine use- 2 dailyLives c husbandTeacher for past 20 yrs, 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 Oneil Ellen for Pain Medicine    Social Drivers of Health   Financial Resource Strain: Not on file  Food Insecurity: No Food Insecurity (07/17/2023)   Hunger Vital Sign    Worried About Running Out of Food in the Last Year: Never true    Ran Out of Food in the Last Year: Never true  Transportation Needs: No Transportation Needs (07/17/2023)   PRAPARE - Administrator, Civil Service (Medical): No    Lack of Transportation (Non-Medical): No  Physical Activity: Not on file  Stress: Not on file  Social Connections: Not on file  Intimate Partner Violence: Not At Risk (07/17/2023)   Humiliation, Afraid, Rape, and Kick questionnaire    Fear of Current or Ex-Partner: No    Emotionally Abused: No    Physically Abused: No    Sexually  Abused: No    Physical Exam: Vital signs in last 24 hours: @BP  122/65   Pulse (!) 114   Temp (!) 97.3 F (36.3 C) (Temporal)   Resp 17   Ht 5' 4 (1.626 m)   Wt 77.1 kg   LMP 12/07/2011   SpO2 97%   BMI 29.18 kg/m  GEN: NAD EYE: Sclerae anicteric ENT: MMM CV: Non-tachycardic Pulm: CTA b/l GI: Soft, NT/ND NEURO:  Alert & Oriented x 3   Sandor Flatter, DO [facility]   09/22/2023 7:48 AM

## 2023-09-22 NOTE — Discharge Instructions (Signed)
YOU HAD AN ENDOSCOPIC PROCEDURE TODAY: Refer to the procedure report and other information in the discharge instructions given to you for any specific questions about what was found during the examination. If this information does not answer your questions, please call [place] office at [phone] to clarify.   YOU SHOULD EXPECT: Some feelings of bloating in the abdomen. Passage of more gas than usual. Walking can help get rid of the air that was put into your GI tract during the procedure and reduce the bloating. If you had a lower endoscopy (such as a colonoscopy or flexible sigmoidoscopy) you may notice spotting of blood in your stool or on the toilet paper. Some abdominal soreness may be present for a day or two, also.  DIET: Your first meal following the procedure should be a light meal and then it is ok to progress to your normal diet. A half-sandwich or bowl of soup is an example of a good first meal. Heavy or fried foods are harder to digest and may make you feel nauseous or bloated. Drink plenty of fluids but you should avoid alcoholic beverages for 24 hours.   ACTIVITY: Your care partner should take you home directly after the procedure. You should plan to take it easy, moving slowly for the rest of the day. You can resume normal activity the day after the procedure however YOU SHOULD NOT DRIVE, use power tools, machinery or perform tasks that involve climbing or major physical exertion for 24 hours (because of the sedation medicines used during the test).   SYMPTOMS TO REPORT IMMEDIATELY: A gastroenterologist can be reached at any hour. Please call [phone]  for any of the following symptoms:  Following lower endoscopy (colonoscopy, flexible sigmoidoscopy) Excessive amounts of blood in the stool  Significant tenderness, worsening of abdominal pains  Swelling of the abdomen that is new, acute  Fever of 100 or higher  Following upper endoscopy (EGD, EUS, ERCP, esophageal  dilation) Vomiting of blood or coffee ground material  New, significant abdominal pain  New, significant chest pain or pain under the shoulder blades  Painful or persistently difficult swallowing  New shortness of breath  Black, tarry-looking or red, bloody stools  FOLLOW UP:  If any biopsies were taken you will be contacted by phone or by letter within the next 1-3 weeks. Call [phone]  if you have not heard about the biopsies in 3 weeks.  Please also call with any specific questions about appointments or follow up tests.

## 2023-09-22 NOTE — Interval H&P Note (Signed)
 History and Physical Interval Note:  09/22/2023 7:51 AM  Melissa Allen  has presented today for surgery, with the diagnosis of diarrhea, epigastric, RUQ abd pain, liver cyst.  The various methods of treatment have been discussed with the patient and family. After consideration of risks, benefits and other options for treatment, the patient has consented to  Procedure(s): COLONOSCOPY WITH PROPOFOL  (N/A) ESOPHAGOGASTRODUODENOSCOPY (EGD) WITH PROPOFOL  (N/A) as a surgical intervention.  The patient's history has been reviewed, patient examined, no change in status, stable for surgery.  I have reviewed the patient's chart and labs.  Questions were answered to the patient's satisfaction.     Sandor [name] Melissa Allen

## 2023-09-23 ENCOUNTER — Other Ambulatory Visit: Payer: Self-pay

## 2023-09-23 DIAGNOSIS — K862 Cyst of pancreas: Secondary | ICD-10-CM

## 2023-09-23 LAB — SURGICAL PATHOLOGY

## 2023-09-24 ENCOUNTER — Encounter (HOSPITAL_COMMUNITY): Payer: Self-pay | Admitting: Gastroenterology

## 2023-09-26 ENCOUNTER — Ambulatory Visit (HOSPITAL_COMMUNITY): Admission: RE | Admit: 2023-09-26 | Payer: 59 | Source: Ambulatory Visit

## 2023-10-04 ENCOUNTER — Other Ambulatory Visit: Payer: Self-pay | Admitting: Internal Medicine

## 2023-10-04 ENCOUNTER — Ambulatory Visit (HOSPITAL_COMMUNITY)
Admission: RE | Admit: 2023-10-04 | Discharge: 2023-10-04 | Disposition: A | Discharge status: Home / Self Care | Payer: 59 | Source: Ambulatory Visit | Attending: Internal Medicine | Admitting: Internal Medicine

## 2023-10-04 DIAGNOSIS — K862 Cyst of pancreas: Secondary | ICD-10-CM | POA: Insufficient documentation

## 2023-10-04 MED ORDER — GADOBUTROL 1 MMOL/ML IV SOLN
7.0000 mL | Freq: Once | INTRAVENOUS | Status: AC | PRN
  Administered 2023-10-04: 7 mL via INTRAVENOUS

## 2023-10-12 ENCOUNTER — Ambulatory Visit: Payer: 59 | Admitting: Physician Assistant

## 2023-10-12 ENCOUNTER — Encounter: Payer: Self-pay | Admitting: Physician Assistant

## 2023-10-12 ENCOUNTER — Encounter: Payer: Self-pay | Admitting: Internal Medicine

## 2023-10-12 ENCOUNTER — Ambulatory Visit (INDEPENDENT_AMBULATORY_CARE_PROVIDER_SITE_OTHER)
Admission: RE | Admit: 2023-10-12 | Discharge: 2023-10-12 | Disposition: A | Discharge status: Home / Self Care | Payer: 59 | Source: Ambulatory Visit | Attending: Physician Assistant | Admitting: Physician Assistant

## 2023-10-12 ENCOUNTER — Other Ambulatory Visit: Payer: 59

## 2023-10-12 VITALS — BP 124/90 | HR 114 | Ht 64.0 in | Wt 171.5 lb

## 2023-10-12 DIAGNOSIS — R197 Diarrhea, unspecified: Secondary | ICD-10-CM

## 2023-10-12 DIAGNOSIS — K7689 Other specified diseases of liver: Secondary | ICD-10-CM

## 2023-10-12 DIAGNOSIS — Z8719 Personal history of other diseases of the digestive system: Secondary | ICD-10-CM

## 2023-10-12 DIAGNOSIS — R1011 Right upper quadrant pain: Secondary | ICD-10-CM

## 2023-10-12 DIAGNOSIS — K219 Gastro-esophageal reflux disease without esophagitis: Secondary | ICD-10-CM

## 2023-10-12 DIAGNOSIS — Z800 Family history of malignant neoplasm of digestive organs: Secondary | ICD-10-CM

## 2023-10-12 DIAGNOSIS — K862 Cyst of pancreas: Secondary | ICD-10-CM

## 2023-10-12 NOTE — Progress Notes (Signed)
10/12/2023 Melissa Allen [id number] 12-11-1965  Referring provider: Dois Davenport, MD Primary GI doctor: Dr. Marina Goodell  ASSESSMENT AND PLAN:   Liver cyst Over 10 cm seen on CT, s/p cholecystectomy With RUQ pain Will discuss if needs referral for fenestration with RUQ pain and large liver cyst with Dr. Marina Goodell  GERD With hiccups EGD with neg H ylori gastritis will only eat one meal a day, has early satiety, worse this past summer She has been on percocet 5 mg up to 4 times a day, possible gastroparesis  Discussed diet with small, frequent meals, soft diet with the patient.  Continue PPI  Diarrhea Colon with no colitis, had large volume stool History chronic pancreatitis, history of chronic pain medications Will get pancreatic elastase, get KUB to evaluate for overflow Possible IBS with anxiety, will refill dicyclomine  Pancreatic cyst, history of chronic pancreatitis, family history in mother of pancreatic cancer.  S/p cholecystectomy  Last pancreatitis was Nov 2018 MRCP yearly with unchanged cyst, no dilation, with family history recall 2 years per Dr. Marina Goodell  Patient Care Team: Dois Davenport, MD as PCP - General (Family Medicine) Pershing Proud, RN as Oncology Nurse Navigator Donnelly Angelica, RN as Oncology Nurse Navigator Magrinat, Valentino Hue, MD (Inactive) as Consulting Physician (Oncology) Griselda Miner, MD as Consulting Physician (General Surgery) Glenna Fellows, MD as Consulting Physician (Plastic Surgery) Julio Sicks, MD as Consulting Physician (Neurosurgery) Reva Bores, MD as Consulting Physician (Obstetrics and Gynecology) Thyra Breed, MD (Anesthesiology)  HISTORY OF PRESENT ILLNESS: 58 y.o. female with a past medical history of breast cancer, obesity, chronic pancreatitis, acute on chronic respiratory failure with hypoxia, chronic kidney disease stage IV, type 2 diabetes with CKD, GERD, family history of colon cancer, difficult intubation, status  post cholecystectomy and others listed below presents for evaluation of abdominal pain.   She has not had a BM in 1-2 days and then she started to have large volume diarrhea. she denies melena or hematochezia.  She had poor prep on her colonoscopy, despite patient saying she had a lot of loose stools with the bowel prep. She has RUQ AB pain consistently, feels as if there is something on her right upper quadrant can feel some he underneath it tender can of burning occasionally. She has GERD, has fullness with food, worse this past summer.  Patient has been on pain medication follows with Dr. Vear Clock, has gone from hydrocodone to oxycodone 5 mg gets 120 every month within this last year.   She  reports that she has never smoked. She has never used smokeless tobacco. She reports that she does not currently use alcohol. She reports that she does not use drugs.  RELEVANT GI HISTORY, LABS, IMAGING: 09/22/2023  endoscopy - Normal esophagus.  - Z-line regular, 35 cm from the incisors.  - Gastritis. Biopsied.   - A few gastric polyps. Resected and retrieved.  - Normal examined duodenum. Biopsied. A. DUODENUM, BIOPSY:  - Duodenal mucosa with no specific histopathologic changes  - Negative for increased intraepithelial lymphocytes or villous  architectural changes  B. STOMACH, BIOPSY:  - Gastric antral mucosa with mild nonspecific reactive gastropathy  - Gastric oxyntic mucosa with no specific histopathologic changes  - Helicobacter pylori-like organisms are not identified on routine HE stain  C. STOMACH, POLYPECTOMY:  - Fundic gland polyp(s)  - Negative for dysplasia   09/22/2023 colonoscopy - Preparation of the colon was fair. - Stool in the entire examined colon. -  Normal mucosa in the entire examined colon. Biopsied. Clip ( MR conditional) was placed. Clip manufacturer: [employer]. - The distal rectum and anal verge are normal on retroflexion view. - The examined portion of the ileum  was normal. . COLON, RIGHT, BIOPSY: - Colonic mucosa with no specific histopathologic changes - Negative for acute inflammation, increased intraepithelial lymphocytes or thickened subepithelial collagen table E. COLON, LEFT, BIOPSY: - Colonic mucosa with no specific histopathologic changes - Negative for acute inflammation, increased intraepithelial lymphocytes or thickened subepithelial collagen table   CT scan of the abdomen and pelvis with and without contrast 07/2023: 1. No acute CT findings of the abdomen or pelvis to explain abdominal pain. 2. Unchanged cystic lesion of the superior pancreatic head, previously characterized by MR. No pancreatic ductal dilatation or surrounding inflammatory changes. This is stable over numerous prior examinations and is almost certainly benign, generally requiring no further follow-up or characterization 3. Large cyst of the central liver dome measuring 10.0 x 9.1 cm, benign, requiring no further follow-up or characterization. 4. Status post bilateral mastectomy with implant reconstruction. 5. Status post hysterectomy and cholecystectomy.   Aortic Atherosclerosis (ICD10-I70.0).   09/28/2019 EGD and colonoscopy.  Colonoscopy revealed diminutive colon polyps which were adenomatous no other abnormality.  EGD showed mild esophagitis otherwise unremarkable.   Recall colonoscopy 09/27/2022  CBC    Component Value Date/Time   WBC 5.7 07/18/2023 0748   RBC 4.73 07/18/2023 0748   HGB 14.2 07/18/2023 0748   HGB 14.5 08/22/2020 1142   HCT 43.7 07/18/2023 0748   PLT 232 07/18/2023 0748   PLT 280 08/22/2020 1142   MCV 92.4 07/18/2023 0748   MCH 30.0 07/18/2023 0748   MCHC 32.5 07/18/2023 0748   RDW 13.2 07/18/2023 0748   LYMPHSABS 2.5 07/17/2023 0823   MONOABS 0.8 07/17/2023 0823   EOSABS 0.3 07/17/2023 0823   BASOSABS 0.1 07/17/2023 0823   Recent Labs    06/02/23 1738 06/03/23 0653 07/17/23 0823 07/18/23 0748  HGB 14.7 11.7* 13.6 14.2     CMP     Component Value Date/Time   NA 137 07/20/2023 0555   K 3.8 07/20/2023 0555   CL 106 07/20/2023 0555   CO2 24 07/20/2023 0555   GLUCOSE 98 07/20/2023 0555   BUN 12 07/20/2023 0555   CREATININE 0.71 07/20/2023 0555   CREATININE 0.77 08/22/2020 1142   CREATININE 0.74 08/04/2011 1757   CALCIUM 8.0 (L) 07/20/2023 0555   PROT 7.3 07/17/2023 0823   ALBUMIN 4.1 07/17/2023 0823   AST 23 07/17/2023 0823   AST 17 08/22/2020 1142   ALT 17 07/17/2023 0823   ALT 17 08/22/2020 1142   ALKPHOS 69 07/17/2023 0823   BILITOT 0.7 07/17/2023 0823   BILITOT 0.7 08/22/2020 1142   GFRNONAA >60 07/20/2023 0555   GFRNONAA >60 08/22/2020 1142   GFRAA >60 02/24/2020 1530      Latest Ref Rng & Units 07/17/2023    8:23 AM 06/04/2023    5:11 AM 06/03/2023    6:53 AM  Hepatic Function  Total Protein 6.5 - 8.1 g/dL 7.3  5.8  6.0   Albumin 3.5 - 5.0 g/dL 4.1  3.1  3.5   AST 15 - 41 U/L 23  20  23    ALT 0 - 44 U/L 17  17  19    Alk Phosphatase 38 - 126 U/L 69  50  59   Total Bilirubin <1.2 mg/dL 0.7  0.7  1.0  Current Medications:   Current Outpatient Medications (Endocrine & Metabolic):    estradiol (ESTRACE) 1 MG tablet, TAKE ONE TABLET BY MOUTH ONCE A DAY   metFORMIN (GLUCOPHAGE-XR) 500 MG 24 hr tablet, Take 500 mg by mouth 2 (two) times daily.  Current Outpatient Medications (Cardiovascular):    EPINEPHrine 0.3 mg/0.3 mL IJ SOAJ injection, Inject 0.3 mLs (0.3 mg total) into the muscle as needed for anaphylaxis.   hydrochlorothiazide (HYDRODIURIL) 25 MG tablet, Take 25 mg by mouth daily.   losartan (COZAAR) 25 MG tablet, Take 25 mg by mouth in the morning and at bedtime.  Current Outpatient Medications (Respiratory):    albuterol (PROAIR HFA) 108 (90 Base) MCG/ACT inhaler, INHALE TWO PUFFS EVERY FOUR HOURS AS NEEDED SHORTNESS OF BREATH (Patient taking differently: Inhale 2 puffs into the lungs every 6 (six) hours as needed for shortness of breath or wheezing.)  Current Outpatient  Medications (Analgesics):    oxyCODONE-acetaminophen (PERCOCET) 5-325 MG tablet, Take 1 tablet by mouth 4 times a day   Current Outpatient Medications (Other):    Dextromethorphan-buPROPion ER (AUVELITY) 45-105 MG TBCR, Take 1 tablet by mouth in the morning and at bedtime.   dicyclomine (BENTYL) 10 MG capsule, Take 1 capsule (10 mg total) by mouth 2 (two) times daily.   doxepin (SINEQUAN) 25 MG capsule, Take 25 mg by mouth at bedtime.   DULoxetine (CYMBALTA) 60 MG capsule, Take 120 mg by mouth daily.   Multiple Vitamin (MULTIVITAMIN WITH MINERALS) TABS tablet, Take 1 tablet by mouth daily.   omeprazole (PRILOSEC) 40 MG capsule, Take 1 capsule (40 mg total) by mouth 2 (two) times daily for 28 days. (Patient taking differently: Take 40 mg by mouth as needed.)   QUEtiapine (SEROQUEL) 400 MG tablet, Take 400 mg by mouth at bedtime.   sucralfate (CARAFATE) 1 g tablet, Take 1 g by mouth 4 (four) times daily.   tiZANidine (ZANAFLEX) 4 MG tablet, Take 1 and 1/2 tablets by mouth 3 times a day as needed (Patient taking differently: Take 6 mg by mouth 3 (three) times daily as needed for muscle spasms.)   zolpidem (AMBIEN) 10 MG tablet, Take 10 mg by mouth at bedtime.  Medical History:  Past Medical History:  Diagnosis Date   Allergy    Anginal pain (HCC)    one episode 4-5 years ago-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, was treated /w phenobarbital for a while, then taken off - 02/24/2020 occurred 30 years ago per patient   Shortness of breath dyspnea    Wears glasses    Allergies:  Allergies  Allergen Reactions   Chlorquinaldol Rash    11/30/19 patch testing: positive reaction to Quinoline Mix (contains clioquinol and  chlorquinaldol)   Clioquinol Rash    11/30/19 patch testing: positive reaction to Quinoline Mix (contains clioquinol and chlorquinaldol)    Other Anaphylaxis    Pt has anaphylactic reaction to something during procedure - unsure of what   Per patient, Quinoline Mix  - based on allergy testing   Latex Itching, Swelling and Dermatitis    SWELLING FROM FACE MASK RING AFTER LAST SURGERY    Lisinopril Cough        Codeine Nausea And Vomiting   Hydrocodone Itching   Tape Rash    TOLERATES PAPER TAPE ONLY NO PINK SURGICAL TAPE EITHER, EKG Leads    Wound Dressing Adhesive Rash    Dermabond     Surgical History:  She  has a past surgical history that includes Tonsillectomy (age 42); Bunionectomy (1995); Cholecystectomy (late 1980's); Lumbar laminectomy (06/2005); Knee surgery (Left); R compressed pronator (Right); Tendon repair (2013); Tendon repair (Right, 02/01/2012); Bladder suspension (03/01/2012); Cystoscopy (03/01/2012); Abdominal hysterectomy; Incision and drainage abscess (Left, 07/19/2013); Cystoscopy w/ ureteral stent placement (Left, 12/15/2014); Spinal fusion; Laminectomy; Cystoscopy w/ ureteral stent placement (Left, 10/02/2016); Hardware Removal (N/A, 12/22/2016); Knee cartilage surgery; Shoulder arthroscopy (Right, 06/30/2017); Resection distal clavical (Right, 06/30/2017); Shoulder acromioplasty (Right, 06/30/2017); laparoscopic appendectomy (N/A, 08/05/2017); Appendectomy (08/05/2017); Anterior lat lumbar fusion (Left, 08/17/2018); Lumbar percutaneous pedicle screw 1 level (Left, 08/17/2018); Posterior cervical laminectomy (Right, 02/28/2020); Mastectomy w/  sentinel node biopsy (Bilateral, 10/19/2020); Breast Reconstruction with placement of Tissue Expander and Alloderm (Bilateral, 10/19/2020); Removal of bilateral tissue expanders with placement of bilateral breast implants (Bilateral, 01/28/2021); Total shoulder arthroplasty (Right, 08/22/2021); Total knee arthroplasty (Left, 08/12/2022); Breast capsulectomy with implant exchange (Bilateral, 04/10/2023); Liposuction (Bilateral, 04/10/2023); Colonoscopy with propofol (N/A, 09/22/2023); Esophagogastroduodenoscopy (egd) with propofol (N/A, 09/22/2023); biopsy (09/22/2023); Hemostasis clip placement (09/22/2023); and polypectomy (09/22/2023). Family History:  Her family history includes Breast cancer in her maternal grandmother and another family member; Cancer in her mother; Colon cancer in her paternal grandfather; Colon polyps in her father; Dementia in her father; Diabetes in an other family member; Heart disease (age of onset: 68) in her father; Leukemia in her father; Ovarian cancer in an other family member; Ovarian cancer (age of onset: 52) in her maternal grandmother; Uterine cancer in an other family member.  REVIEW OF SYSTEMS  : All other systems reviewed and negative except where noted in the History of Present Illness.  PHYSICAL EXAM: BP (!) 124/90   Pulse (!) 114   Ht 5\' 4"  (1.626 m)   Wt 171 lb 8 oz (77.8 kg)   LMP 12/07/2011   BMI 29.44 kg/m  General Appearance: Well nourished, in no apparent distress. Head:   Normocephalic and atraumatic. Eyes:  sclerae anicteric,conjunctive pink  Respiratory: Respiratory effort normal, BS equal bilaterally without rales, rhonchi, wheezing. Cardio: RRR with no MRGs. Peripheral pulses intact.  Abdomen: Soft,  Obese ,active bowel sounds. moderate tenderness in the epigastrium and in the RUQ. Without guarding and Without rebound. No masses. Rectal: Not evaluated Musculoskeletal: Full ROM, Normal gait. Without edema. Skin:  Dry and intact without significant  lesions or rashes Neuro: Alert and  oriented x4;  No focal deficits. Psych:  Cooperative. Normal mood and affect.    Doree Albee, PA-C 3:44 PM

## 2023-10-12 NOTE — Patient Instructions (Addendum)
Your provider has requested that you have an abdominal x ray before leaving today. Please go to the basement floor to our Radiology department for the test.  Your provider has requested that you go to the basement level for lab work before leaving today. Press "B" on the elevator. The lab is located at the first door on the left as you exit the elevator.  First do a trial off milk/lactose products if you use them.  Add fiber like benefiber or citracel once a day Increase activity Can do trial of IBGard which is over the counter for AB pain- Take 1-2 capsules once a day for maintence or twice a day during a flare Can send in an anti spasm medication, Bentyl, to take as needed   FODMAP stands for fermentable oligo-, di-, mono-saccharides and polyols (1). These are the scientific terms used to classify groups of carbs that are difficult for our body to digest and that are notorious for triggering digestive symptoms like bloating, gas, loose stools and stomach pain.   You can try low FODMAP diet  - start with eliminating just one column at a time that you feel may be a trigger for you. - the table at the very bottom contains foods that are low in FODMAPs   Sometimes trying to eliminate the FODMAP's from your diet is difficult or tricky, if you are stuggling with trying to do the elimination diet you can try an enzyme.  There is a food enzymes that you sprinkle in or on your food that helps break down the FODMAP. You can read more about the enzyme by going to this site: https://fodzyme.com/    Gastroparesis Please do small frequent meals like 4-6 meals a day.  Eat and drink liquids at separate times.  Avoid high fiber foods, cook your vegetables, avoid high fat food.  Suggest spreading protein throughout the day (greek yogurt, glucerna, soft meat, milk, eggs) Choose soft foods that you can mash with a fork When you are more symptomatic, change to pureed foods foods and liquids.  Consider  reading "Living well with Gastroparesis" by Reuel Derby Gastroparesis is a condition in which food takes longer than normal to empty from the stomach. This condition is also known as delayed gastric emptying. It is usually a long-term (chronic) condition. There is no cure, but there are treatments and things that you can do at home to help relieve symptoms. Treating the underlying condition that causes gastroparesis can also help relieve symptoms What are the causes? In many cases, the cause of this condition is not known. Possible causes include: A hormone (endocrine) disorder, such as hypothyroidism or diabetes. A nervous system disease, such as Parkinson's disease or multiple sclerosis. Cancer, infection, or surgery that affects the stomach or vagus nerve. The vagus nerve runs from your chest, through your neck, and to the lower part of your brain. A connective tissue disorder, such as scleroderma. Certain medicines. What increases the risk? You are more likely to develop this condition if: You have certain disorders or diseases. These may include: An endocrine disorder. An eating disorder. Amyloidosis. Scleroderma. Parkinson's disease. Multiple sclerosis. Cancer or infection of the stomach or the vagus nerve. You have had surgery on your stomach or vagus nerve. You take certain medicines. You are female. What are the signs or symptoms? Symptoms of this condition include: Feeling full after eating very little or a loss of appetite. Nausea, vomiting, or heartburn. Bloating of your abdomen. Inconsistent blood sugar (glucose) levels  on blood tests. Unexplained weight loss. Acid from the stomach coming up into the esophagus (gastroesophageal reflux). Sudden tightening (spasm) of the stomach, which can be painful. Symptoms may come and go. Some people may not notice any symptoms. How is this diagnosed? This condition is diagnosed with tests, such as: Tests that check how long  it takes food to move through the stomach and intestines. These tests include: Upper gastrointestinal (GI) series. For this test, you drink a liquid that shows up well on X-rays, and then X-rays are taken of your intestines. Gastric emptying scintigraphy. For this test, you eat food that contains a small amount of radioactive material, and then scans are taken. Wireless capsule GI monitoring system. For this test, you swallow a pill (capsule) that records information about how foods and fluid move through your stomach. Gastric manometry. For this test, a tube is passed down your throat and into your stomach to measure electrical and muscular activity. Endoscopy. For this test, a long, thin tube with a camera and light on the end is passed down your throat and into your stomach to check for problems in your stomach lining. Ultrasound. This test uses sound waves to create images of the inside of your body. This can help rule out gallbladder disease or pancreatitis as a cause of your symptoms. How is this treated? There is no cure for this condition, but treatment and home care may relieve symptoms. Treatment may include: Treating the underlying cause. Managing your symptoms by making changes to your diet and exercise habits. Taking medicines to control nausea and vomiting and to stimulate stomach muscles. Getting food through a feeding tube in the hospital. This may be done in severe cases. Having surgery to insert a device called a gastric electrical stimulator into your body. This device helps improve stomach emptying and control nausea and vomiting. Follow these instructions at home: Take over-the-counter and prescription medicines only as told by your health care provider. Follow instructions from your health care provider about eating or drinking restrictions. Your health care provider may recommend that you: Eat smaller meals more often. Eat low-fat foods. Eat low-fiber forms of high-fiber  foods. For example, eat cooked vegetables instead of raw vegetables. Have only liquid foods instead of solid foods. Liquid foods are easier to digest. Drink enough fluid to keep your urine pale yellow. Exercise as often as told by your health care provider. Keep all follow-up visits. This is important. Contact a health care provider if you: Notice that your symptoms do not improve with treatment. Have new symptoms. Get help right away if you: Have severe pain in your abdomen that does not improve with treatment. Have nausea that is severe or does not go away. Vomit every time you drink fluids. Summary Gastroparesis is a long-term (chronic) condition in which food takes longer than normal to empty from the stomach. Symptoms include nausea, vomiting, heartburn, bloating of your abdomen, and loss of appetite. Eating smaller portions, low-fat foods, and low-fiber forms of high-fiber foods may help you manage your symptoms. Get help right away if you have severe pain in your abdomen. This information is not intended to replace advice given to you by your health care provider. Make sure you discuss any questions you have with your health care provider. Document Revised: 01/02/2020 Document Reviewed: 01/02/2020 Elsevier Patient Education  2021 [employer].

## 2023-10-19 ENCOUNTER — Telehealth: Payer: Self-pay

## 2023-10-19 NOTE — Telephone Encounter (Signed)
-----   Message from Edmonia Gottron sent at 10/19/2023  7:59 AM EST ----- Please refer to hepatobiliary surgeon for evaluation of large hepatic cyst and pain.  Thanks, Mylinda Asa ----- Message ----- From: Tobin Forts, MD Sent: 10/12/2023   4:16 PM EST To: Edmonia Gottron, PA-C  Difficult to say, quite frankly. If she is incapacitated and you feel that this is a possibility, then have her see a hepatobiliary surgeon for a surgical opinion.  Otherwise, expectant management ----- Message ----- From: Devorah Fonder Sent: 10/12/2023   4:07 PM EST To: Tobin Forts, MD  Agree, benign but I know if liver cyst is large enough they will send for evaluation for fenestration I believe with IR. Her cyst is 10 cm, so just wanted your opinion on that.  Thanks, Mylinda Asa ----- Message ----- From: Tobin Forts, MD Sent: 10/12/2023   3:47 PM EST To: Edmonia Gottron, PA-C  All cystic lesions are benign. I do not think they are causing her symptoms. Radiology does not recommend follow-up of liver or pancreatic cyst. I have reviewed the MRI and sent her a note through my nurse.  Thanks ----- Message ----- From: Devorah Fonder Sent: 10/12/2023   3:44 PM EST To: Tobin Forts, MD  Thoughts of large liver cyst I think some of her issue is honestly opioid-induced and getting the KUB to evaluate Other things but I have had some patients with rather large liver cyst causing some other discomfort let me know. Thanks, Mylinda Asa

## 2023-10-19 NOTE — Telephone Encounter (Signed)
 Referral, records, demographic, and insurance information faxed to [facility] at [phone].  Patient notified via MyChart.

## 2023-12-07 ENCOUNTER — Other Ambulatory Visit (HOSPITAL_COMMUNITY): Payer: Self-pay | Admitting: General Surgery

## 2023-12-07 DIAGNOSIS — K7689 Other specified diseases of liver: Secondary | ICD-10-CM

## 2023-12-07 NOTE — Progress Notes (Signed)
 Gilmer Mor, DO  Claudean Kinds; P Ir Procedure Requests OK for US guided hepatic cyst aspiration, therapeutic.  [address]       Previous Messages    ----- Message ----- From: Claudean Kinds Sent: 12/07/2023   1:15 PM EDT To: Claudean Kinds; Ir Procedure Requests Subject: [place] FINE NEEDLE ASP 1ST LESION                  Procedure: [place] FINE NEEDLE ASP 1ST LESION  Reason: , aspiration as test for symptom resolution Dx: Hepatic cyst [K76.89 (ICD-10-CM)]    History : DG Abd 1 view , MR abdomen mrcp , CT abd pelv w/ wo  Provider : Almond Lint, MD  Contact : [phone]

## 2023-12-08 DIAGNOSIS — K7689 Other specified diseases of liver: Secondary | ICD-10-CM

## 2023-12-08 HISTORY — DX: Other specified diseases of liver: K76.89

## 2023-12-29 ENCOUNTER — Ambulatory Visit: Payer: 59 | Admitting: Physician Assistant

## 2023-12-29 NOTE — Progress Notes (Deleted)
 12/29/2023 Melissa Allen [id number] 01-Apr-1966  Referring provider: Allene Ivan, MD Primary GI doctor: Dr. Elvin Hammer  ASSESSMENT AND PLAN:   Liver cyst with right upper quadrant pain Over 10 cm seen on CT, s/p cholecystectomy Seen by Dr. Cherlynn Cornfield [facility] 12/07/2023 did trial Celebrex , referral to PT, consider test aspiration of cyst  GERD With hiccups 09/22/2023 EGD with neg H ylori gastritis will only eat one meal a day, has early satiety, worse this past summer She has been on percocet 5 mg up to 4 times a day, possible gastroparesis  Discussed diet with small, frequent meals, soft diet with the patient.  Continue PPI  Diarrhea Colonoscopy with no microscopic colitis, had large volume stool Possible IBS versus overflow with KUB 10/12/2023 showing mild colonic stool burden, patient is on pain medications Did not complete fecal elastase for pancreatic elastase  Pancreatic cyst, history of chronic pancreatitis, family history in mother of pancreatic cancer.  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  Patient Care Team: Allene Ivan, MD as PCP - General (Family Medicine) Auther Bo, RN as Oncology Nurse Navigator Noble Bateman, Regino Caprio, RN as Oncology Nurse Navigator Magrinat, Rozella Cornfield, MD (Inactive) as Consulting Physician (Oncology) Caralyn Chandler, MD as Consulting Physician (General Surgery) Alger Infield, MD as Consulting Physician (Plastic Surgery) Agustina Aldrich, MD as Consulting Physician (Neurosurgery) Granville Layer, MD as Consulting Physician (Obstetrics and Gynecology) Gwendel Lemme, MD (Anesthesiology)  HISTORY OF PRESENT ILLNESS: 58 y.o. female with a past  medical history of breast cancer, obesity, chronic pancreatitis, acute on chronic respiratory failure with hypoxia, chronic kidney disease stage IV, type 2 diabetes with CKD, GERD, family history of colon cancer, difficult intubation, status post cholecystectomy and others listed below presents for evaluation of abdominal pain.   She has not had a BM in 1-2 days and then she started to have large volume diarrhea. she denies melena or hematochezia.  She had poor prep on her colonoscopy, despite patient saying she had a lot of loose stools with the bowel prep. She has RUQ AB pain consistently, feels as if there is something on her right upper quadrant can feel some he underneath it tender can of burning occasionally. She has GERD, has fullness with food, worse this past summer.  Patient has been on pain medication follows with Dr. Valda Garnet, has gone from hydrocodone  to oxycodone  5 mg gets 120 every month within this last year.   She  reports that she has never smoked. She has never used smokeless tobacco. She reports that she does not currently use alcohol. She reports that she does not use drugs.  RELEVANT GI HISTORY, LABS, IMAGING: 09/22/2023  endoscopy - Normal esophagus.  - Z-line regular, 35 cm from the incisors.  - Gastritis. Biopsied.   - A few gastric polyps. Resected and retrieved.  - Normal examined duodenum. Biopsied. A. DUODENUM, BIOPSY:  - Duodenal mucosa with no specific histopathologic changes  - Negative for increased intraepithelial lymphocytes or villous  architectural changes  B. STOMACH, BIOPSY:  - Gastric antral mucosa with mild nonspecific reactive gastropathy  - Gastric oxyntic mucosa with no specific histopathologic changes  - Helicobacter pylori-like organisms are not identified on routine HE stain  C. STOMACH, POLYPECTOMY:  - Fundic gland polyp(s)  - Negative for dysplasia   09/22/2023 colonoscopy - Preparation of the colon was fair. - Stool in the entire  examined colon. - Normal mucosa in the entire examined colon. Biopsied. Clip ( MR conditional) was placed. Clip manufacturer: [employer]. - The distal rectum and anal verge are normal on retroflexion view. - The examined portion of the ileum was normal. . COLON, RIGHT, BIOPSY: - Colonic mucosa with no specific histopathologic changes - Negative for acute inflammation, increased intraepithelial lymphocytes or thickened subepithelial collagen table E. COLON, LEFT, BIOPSY: - Colonic mucosa with no specific histopathologic changes - Negative for acute inflammation, increased intraepithelial lymphocytes or thickened subepithelial collagen table   CT scan of the abdomen and pelvis with and without contrast 07/2023: 1. No acute CT findings of the abdomen or pelvis to explain abdominal pain. 2. Unchanged cystic lesion of the superior pancreatic head, previously characterized by MR. No pancreatic ductal dilatation or surrounding inflammatory changes. This is stable over numerous prior examinations and is almost certainly benign, generally requiring no further follow-up or characterization 3. Large cyst of the central liver dome measuring 10.0 x 9.1 cm, benign, requiring no further follow-up or characterization. 4. Status post bilateral mastectomy with implant reconstruction. 5. Status post hysterectomy and cholecystectomy.   Aortic Atherosclerosis (ICD10-I70.0).   09/28/2019 EGD and colonoscopy.  Colonoscopy revealed diminutive colon polyps which were adenomatous no other abnormality.  EGD showed mild esophagitis otherwise unremarkable.   Recall colonoscopy 09/27/2022  CBC    Component Value Date/Time   WBC 5.7 07/18/2023 0748   RBC 4.73 07/18/2023 0748   HGB 14.2 07/18/2023 0748   HGB 14.5 08/22/2020 1142   HCT 43.7 07/18/2023 0748   PLT 232 07/18/2023 0748   PLT 280 08/22/2020 1142   MCV 92.4 07/18/2023 0748   MCH 30.0 07/18/2023 0748   MCHC 32.5 07/18/2023 0748   RDW 13.2  07/18/2023 0748   LYMPHSABS 2.5 07/17/2023 0823   MONOABS 0.8 07/17/2023 0823   EOSABS 0.3 07/17/2023 0823   BASOSABS 0.1 07/17/2023 0823   Recent Labs    06/02/23 1738 06/03/23 0653 07/17/23 0823 07/18/23 0748  HGB 14.7 11.7* 13.6 14.2    CMP     Component Value Date/Time   NA 137 07/20/2023 0555   K 3.8 07/20/2023 0555   CL 106 07/20/2023 0555   CO2 24 07/20/2023 0555   GLUCOSE 98 07/20/2023 0555   BUN 12 07/20/2023 0555   CREATININE 0.71 07/20/2023 0555   CREATININE 0.77 08/22/2020 1142   CREATININE 0.74 08/04/2011 1757   CALCIUM 8.0 (L) 07/20/2023 0555   PROT 7.3 07/17/2023 0823   ALBUMIN 4.1 07/17/2023 0823   AST 23 07/17/2023 0823   AST 17 08/22/2020 1142   ALT 17 07/17/2023 0823   ALT 17 08/22/2020 1142   ALKPHOS 69 07/17/2023 0823   BILITOT 0.7 07/17/2023 0823   BILITOT 0.7 08/22/2020 1142   GFRNONAA >60 07/20/2023 0555   GFRNONAA >60 08/22/2020 1142   GFRAA >60 02/24/2020 1530      Latest Ref Rng & Units 07/17/2023    8:23 AM 06/04/2023    5:11 AM 06/03/2023    6:53 AM  Hepatic Function  Total Protein 6.5 - 8.1 g/dL 7.3  5.8  6.0   Albumin 3.5 - 5.0 g/dL 4.1  3.1  3.5   AST 15 - 41 U/L 23  20  23    ALT 0 - 44 U/L 17  17  19   Alk Phosphatase 38 - 126 U/L 69  50  59   Total Bilirubin <1.2 mg/dL 0.7  0.7  1.0       Current Medications:   Current Outpatient Medications (Endocrine & Metabolic):    estradiol  (ESTRACE ) 1 MG tablet, TAKE ONE TABLET BY MOUTH ONCE A DAY   metFORMIN (GLUCOPHAGE-XR) 500 MG 24 hr tablet, Take 500 mg by mouth 2 (two) times daily.  Current Outpatient Medications (Cardiovascular):    EPINEPHrine  0.3 mg/0.3 mL IJ SOAJ injection, Inject 0.3 mLs (0.3 mg total) into the muscle as needed for anaphylaxis.   hydrochlorothiazide  (HYDRODIURIL ) 25 MG tablet, Take 25 mg by mouth daily.   losartan  (COZAAR ) 25 MG tablet, Take 25 mg by mouth in the morning and at bedtime.  Current Outpatient Medications (Respiratory):    albuterol  (PROAIR   HFA) 108 (90 Base) MCG/ACT inhaler, INHALE TWO PUFFS EVERY FOUR HOURS AS NEEDED SHORTNESS OF BREATH (Patient taking differently: Inhale 2 puffs into the lungs every 6 (six) hours as needed for shortness of breath or wheezing.)  Current Outpatient Medications (Analgesics):    oxyCODONE -acetaminophen  (PERCOCET) 5-325 MG tablet, Take 1 tablet by mouth 4 times a day   Current Outpatient Medications (Other):    Dextromethorphan -buPROPion  ER (AUVELITY ) 45-105 MG TBCR, Take 1 tablet by mouth in the morning and at bedtime.   dicyclomine  (BENTYL ) 10 MG capsule, Take 1 capsule (10 mg total) by mouth 2 (two) times daily.   doxepin  (SINEQUAN ) 25 MG capsule, Take 25 mg by mouth at bedtime.   DULoxetine  (CYMBALTA ) 60 MG capsule, Take 120 mg by mouth daily.   Multiple Vitamin (MULTIVITAMIN WITH MINERALS) TABS tablet, Take 1 tablet by mouth daily.   omeprazole  (PRILOSEC) 40 MG capsule, Take 1 capsule (40 mg total) by mouth 2 (two) times daily for 28 days. (Patient taking differently: Take 40 mg by mouth as needed.)   QUEtiapine  (SEROQUEL ) 400 MG tablet, Take 400 mg by mouth at bedtime.   sucralfate  (CARAFATE ) 1 g tablet, Take 1 g by mouth 4 (four) times daily.   tiZANidine  (ZANAFLEX ) 4 MG tablet, Take 1 and 1/2 tablets by mouth 3 times a day as needed (Patient taking differently: Take 6 mg by mouth 3 (three) times daily as needed for muscle spasms.)   zolpidem  (AMBIEN ) 10 MG tablet, Take 10 mg by mouth at bedtime.  Medical History:  Past Medical History:  Diagnosis Date   Allergy     Anginal pain (HCC)    one episode 4-5 years ago-[facility] [place]-nonspecific-no problems since-   Anxiety    Arthritis    multiple areas of arthritis- DDD   Asthma    recent admit to ER 01/11/12 for exac of asthma   Calcaneal fracture    Cancer (HCC)    Duplicated ureter, right    Family history of breast cancer    Family history of colon cancer    Family history of ovarian cancer    Family history of  pancreatic cancer    Fibromyalgia    History of kidney stones    Hypertension    Obesity (BMI 30.0-34.9)    Pancreatitis 1986   Pneumonia    PONV (postoperative nausea and vomiting)    Poor venous access    hard to start iv-   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, was treated /w phenobarbital for a while, then taken off - 02/24/2020 occurred 30 years ago per patient   Shortness of breath dyspnea  Wears glasses    Allergies:  Allergies  Allergen Reactions   Chlorquinaldol Rash    11/30/19 patch testing: positive reaction to Quinoline Mix (contains clioquinol and chlorquinaldol)   Clioquinol Rash    11/30/19 patch testing: positive reaction to Quinoline Mix (contains clioquinol and chlorquinaldol)    Other Anaphylaxis    Pt has anaphylactic reaction to something during procedure - unsure of what   Per patient, Quinoline Mix  - based on allergy  testing   Latex Itching, Swelling and Dermatitis    SWELLING FROM FACE MASK RING AFTER LAST SURGERY    Lisinopril Cough        Codeine Nausea And Vomiting   Hydrocodone  Itching   Tape Rash    TOLERATES PAPER TAPE ONLY NO PINK SURGICAL TAPE EITHER, EKG Leads    Wound Dressing Adhesive Rash    Dermabond     Surgical History:  She  has a past surgical history that includes Tonsillectomy (age 72); Bunionectomy (1995); Cholecystectomy (late 1980's); Lumbar laminectomy (06/2005); Knee surgery (Left); R compressed pronator (Right); Tendon repair (2013); Tendon repair (Right, 02/01/2012); Bladder suspension (03/01/2012); Cystoscopy (03/01/2012); Abdominal hysterectomy; Incision and drainage abscess (Left, 07/19/2013); Cystoscopy w/ ureteral stent placement (Left, 12/15/2014); Spinal fusion; Laminectomy; Cystoscopy w/ ureteral stent placement (Left, 10/02/2016); Hardware Removal (N/A, 12/22/2016); Knee cartilage surgery; Shoulder arthroscopy (Right, 06/30/2017); Resection distal clavical (Right, 06/30/2017); Shoulder acromioplasty (Right,  06/30/2017); laparoscopic appendectomy (N/A, 08/05/2017); Appendectomy (08/05/2017); Anterior lat lumbar fusion (Left, 08/17/2018); Lumbar percutaneous pedicle screw 1 level (Left, 08/17/2018); Posterior cervical laminectomy (Right, 02/28/2020); Mastectomy w/ sentinel node biopsy (Bilateral, 10/19/2020); Breast Reconstruction with placement of Tissue Expander and Alloderm (Bilateral, 10/19/2020); Removal of bilateral tissue expanders with placement of bilateral breast implants (Bilateral, 01/28/2021); Total shoulder arthroplasty (Right, 08/22/2021); Total knee arthroplasty (Left, 08/12/2022); Breast capsulectomy with implant exchange (Bilateral, 04/10/2023); Liposuction (Bilateral, 04/10/2023); Colonoscopy with propofol  (N/A, 09/22/2023); Esophagogastroduodenoscopy (egd) with propofol  (N/A, 09/22/2023); biopsy (09/22/2023); Hemostasis clip placement (09/22/2023); and polypectomy (09/22/2023). Family History:  Her family history includes Breast cancer in her maternal grandmother and another family member; Cancer in her mother; Colon cancer in her paternal grandfather; Colon polyps in her father; Dementia in her father; Diabetes in an other family member; Heart disease (age of onset: 59) in her father; Leukemia in her father; Ovarian cancer in an other family member; Ovarian cancer (age of onset: 73) in her maternal grandmother; Uterine cancer in an other family member.  REVIEW OF SYSTEMS  : All other systems reviewed and negative except where noted in the History of Present Illness.  PHYSICAL EXAM: LMP 12/07/2011  General Appearance: Well nourished, in no apparent distress. Head:   Normocephalic and atraumatic. Eyes:  sclerae anicteric,conjunctive pink  Respiratory: Respiratory effort normal, BS equal bilaterally without rales, rhonchi, wheezing. Cardio: RRR with no MRGs. Peripheral pulses intact.  Abdomen: Soft,  Obese ,active bowel sounds. moderate tenderness in the epigastrium and in the RUQ. Without guarding  and Without rebound. No masses. Rectal: Not evaluated Musculoskeletal: Full ROM, Normal gait. Without edema. Skin:  Dry and intact without significant lesions or rashes Neuro: Alert and  oriented x4;  No focal deficits. Psych:  Cooperative. Normal mood and affect.    Edmonia Gottron, PA-C 7:52 AM

## 2023-12-30 ENCOUNTER — Other Ambulatory Visit: Payer: Self-pay | Admitting: Radiology

## 2023-12-30 DIAGNOSIS — K7689 Other specified diseases of liver: Secondary | ICD-10-CM

## 2023-12-31 ENCOUNTER — Other Ambulatory Visit: Payer: Self-pay

## 2023-12-31 ENCOUNTER — Ambulatory Visit (HOSPITAL_COMMUNITY)
Admission: RE | Admit: 2023-12-31 | Discharge: 2023-12-31 | Disposition: A | Discharge status: Home / Self Care | Source: Ambulatory Visit | Attending: General Surgery | Admitting: General Surgery

## 2023-12-31 DIAGNOSIS — F419 Anxiety disorder, unspecified: Secondary | ICD-10-CM | POA: Diagnosis not present

## 2023-12-31 DIAGNOSIS — I10 Essential (primary) hypertension: Secondary | ICD-10-CM | POA: Insufficient documentation

## 2023-12-31 DIAGNOSIS — M797 Fibromyalgia: Secondary | ICD-10-CM | POA: Diagnosis not present

## 2023-12-31 DIAGNOSIS — Z853 Personal history of malignant neoplasm of breast: Secondary | ICD-10-CM | POA: Diagnosis not present

## 2023-12-31 DIAGNOSIS — J45909 Unspecified asthma, uncomplicated: Secondary | ICD-10-CM | POA: Insufficient documentation

## 2023-12-31 DIAGNOSIS — E669 Obesity, unspecified: Secondary | ICD-10-CM | POA: Diagnosis not present

## 2023-12-31 DIAGNOSIS — K7689 Other specified diseases of liver: Secondary | ICD-10-CM | POA: Diagnosis present

## 2023-12-31 LAB — CBC
HCT: 37.8 % (ref 36.0–46.0)
Hemoglobin: 12.4 g/dL (ref 12.0–15.0)
MCH: 29 pg (ref 26.0–34.0)
MCHC: 32.8 g/dL (ref 30.0–36.0)
MCV: 88.3 fL (ref 80.0–100.0)
Platelets: 232 10*3/uL (ref 150–400)
RBC: 4.28 MIL/uL (ref 3.87–5.11)
RDW: 12.7 % (ref 11.5–15.5)
WBC: 5.3 10*3/uL (ref 4.0–10.5)
nRBC: 0 % (ref 0.0–0.2)

## 2023-12-31 LAB — COMPREHENSIVE METABOLIC PANEL WITH GFR
ALT: 12 U/L (ref 0–44)
AST: 18 U/L (ref 15–41)
Albumin: 3.4 g/dL — ABNORMAL LOW (ref 3.5–5.0)
Alkaline Phosphatase: 67 U/L (ref 38–126)
Anion gap: 13 (ref 5–15)
BUN: 13 mg/dL (ref 6–20)
CO2: 25 mmol/L (ref 22–32)
Calcium: 9.3 mg/dL (ref 8.9–10.3)
Chloride: 101 mmol/L (ref 98–111)
Creatinine, Ser: 1.42 mg/dL — ABNORMAL HIGH (ref 0.44–1.00)
GFR, Estimated: 43 mL/min — ABNORMAL LOW (ref >60)
Glucose, Bld: 88 mg/dL (ref 70–99)
Potassium: 3.1 mmol/L — ABNORMAL LOW (ref 3.5–5.1)
Sodium: 139 mmol/L (ref 135–145)
Total Bilirubin: 0.5 mg/dL (ref 0.0–1.2)
Total Protein: 6.3 g/dL — ABNORMAL LOW (ref 6.5–8.1)

## 2023-12-31 LAB — PROTIME-INR
INR: 1 (ref 0.8–1.2)
Prothrombin Time: 13.2 s (ref 11.4–15.2)

## 2023-12-31 MED ORDER — MIDAZOLAM HCL 2 MG/2ML IJ SOLN
INTRAMUSCULAR | Status: AC | PRN
  Administered 2023-12-31 (×4): .5 mg via INTRAVENOUS

## 2023-12-31 MED ORDER — FENTANYL CITRATE (PF) 100 MCG/2ML IJ SOLN
INTRAMUSCULAR | Status: AC
  Filled 2023-12-31: qty 4

## 2023-12-31 MED ORDER — FENTANYL CITRATE (PF) 100 MCG/2ML IJ SOLN
INTRAMUSCULAR | Status: AC | PRN
  Administered 2023-12-31 (×2): 50 ug via INTRAVENOUS
  Administered 2023-12-31: 25 ug via INTRAVENOUS

## 2023-12-31 MED ORDER — LIDOCAINE HCL (PF) 1 % IJ SOLN
19.0000 mL | Freq: Once | INTRAMUSCULAR | Status: AC
  Administered 2023-12-31: 19 mL via INTRADERMAL

## 2023-12-31 MED ORDER — MIDAZOLAM HCL 2 MG/2ML IJ SOLN
INTRAMUSCULAR | Status: AC
  Filled 2023-12-31: qty 4

## 2023-12-31 MED ORDER — SODIUM CHLORIDE 0.9 % IV SOLN: INTRAVENOUS | Status: DC

## 2023-12-31 NOTE — Discharge Instructions (Signed)
 Needle Puncture , Care After   These instructions tell you how to care for yourself after your procedure. Your doctor may give you more instructions. Call your doctor if you have any problems or questions. What can I expect after the procedure? After a needle biopsy, it is common to have these things at the puncture site: Soreness. Bruising. Mild pain. These things should go away after a few days. Follow these instructions at home: Puncture site care  Wash your hands with soap and water  for at least 20 seconds before and after you change your bandage (dressing). If you cannot use soap and water , use hand sanitizer. Follow instructions from your doctor about how to take care of your puncture site. This includes: When and how to change your bandage. When to take off your bandage. Check your puncture site every day for signs of infection. Check for: Redness, swelling, or more pain. Fluid or blood. Warmth. Pus or a bad smell. General instructions Go back to your normal activities when your doctor says that it is safe. Ask if there is anything you cannot do as you heal. Take over-the-counter and prescription medicines only as told by your doctor. Do not take baths, swim, or use a hot tub. Ask your doctor when you can shower. Keep all follow-up visits. Ask if you need an appointment to get your biopsy results. Contact a doctor if: You have a fever. You have redness, swelling, or more pain at the puncture site, and it lasts longer than a few days. You have fluid, blood, or pus coming from the puncture site. Your puncture site feels warm. Get help right away if: You have very bad bleeding from the puncture site. Summary After the procedure, it is common to have soreness, bruising, or mild pain at the puncture site. Check your puncture site every day for signs of infection, such as  redness, swelling, or more pain. Get help right away if you have very bad bleeding from your puncture site. This information is not intended to replace advice given to you by your health care provider. Make sure you discuss any questions you have with your health care provider. Document Revised: 02/13/2021 Document Reviewed: 02/13/2021 Elsevier Patient Education  2024 E

## 2023-12-31 NOTE — H&P (Signed)
 Chief Complaint: Patient was seen in consultation today for hepatic cyst   at the request of Byerly,Faera  Referring Physician(s): Byerly,Faera  Supervising Physician: Myrlene Asper  Patient Status: [facility] - Out-pt  Full Code  History of Present Illness: Melissa Allen is a 58 y.o. 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 Patient is currently followed by Lockie Rima, MD with general surgery. Patient's last visit was 12/07/23. Most recent MRI abdomen was performed 10/04/23, which the impression is below.     Patient was referred by Dr. Cherlynn Cornfield for an aspiration of the hepatic cyst. Patient reports continued right sided abdominal pain. The area is also tender to touch. Endorses: right sided abdominal pain, occasional nausea. Denies: chest pain, shortness of breath, cough, vomiting, diarrhea, fever/chills.  Past Medical History:  Diagnosis Date   Allergy     Anginal pain (HCC)    one episode 4-5 years ago-[facility] [place]-nonspecific-no problems since-   Anxiety    Arthritis    multiple areas of arthritis- DDD   Asthma    recent admit to ER 01/11/12 for exac of asthma   Calcaneal fracture    Cancer (HCC)    Duplicated ureter, right    Family history of breast cancer    Family history of colon cancer    Family history of ovarian cancer    Family history of pancreatic cancer    Fibromyalgia    History of kidney stones    Hypertension    Obesity (BMI 30.0-34.9)    Pancreatitis 1986   Pneumonia    PONV (postoperative nausea and vomiting)    Poor venous access    hard to start iv-   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, was treated /w phenobarbital for a while, then taken off - 02/24/2020 occurred 30 years ago per patient   Shortness of breath dyspnea    Wears glasses     Past Surgical History:  Procedure Laterality Date   ABDOMINAL HYSTERECTOMY      ANTERIOR LAT LUMBAR FUSION Left 08/17/2018   Procedure: LUMBAR THREE-FOUR ANTERIOR LATERAL INTERBODY FUSION;  Surgeon: Agustina Aldrich, MD;  Location: [facility];  Service: Neurosurgery;  Laterality: Left;   APPENDECTOMY  08/05/2017   BIOPSY  09/22/2023   Procedure: BIOPSY;  Surgeon: Annis Kinder, DO;  Location: [facility];  Service: Gastroenterology;;   BLADDER SUSPENSION  03/01/2012   Procedure: TRANSVAGINAL TAPE (TVT) PROCEDURE;  Surgeon: Ana Balling, MD;  Location: [facility];  Service: Gynecology;  Laterality: N/A;   BREAST CAPSULECTOMY WITH IMPLANT EXCHANGE Bilateral 04/10/2023   Procedure: BREAST CAPSULORRHAPHY WITH SILICONE IMPLANT EXCHANGE;  Surgeon: Alger Infield, MD;  Location: [facility];  Service: Plastics;  Laterality: Bilateral;   BREAST RECONSTRUCTION WITH PLACEMENT OF TISSUE EXPANDER AND ALLODERM Bilateral 10/19/2020   Procedure: BILATERAL BREAST RECONSTRUCTION WITH PLACEMENT OF TISSUE EXPANDER AND ALLODERM;  Surgeon: Alger Infield, MD;  Location: [facility];  Service: Plastics;  Laterality: Bilateral;   BUNIONECTOMY  1995   Right   CHOLECYSTECTOMY  late 1980's       COLONOSCOPY WITH PROPOFOL  N/A 09/22/2023   Procedure: COLONOSCOPY WITH PROPOFOL ;  Surgeon: Annis Kinder, DO;  [facility];  Service: Gastroenterology;  Laterality: N/A;   CYSTOSCOPY  03/01/2012   Procedure: CYSTOSCOPY;  Surgeon: Ana Balling, MD;  Location: [facility];  Service: Gynecology;  Laterality: N/A;   CYSTOSCOPY  W/ URETERAL STENT PLACEMENT Left 12/15/2014   Procedure: CYSTOSCOPY, LEFT URETEROSCOPYU WITH STONE EXTRACTION;  Surgeon: Homero Luster, MD;  Location: [facility];  Service: Urology;  Laterality: Left;   CYSTOSCOPY W/ URETERAL STENT PLACEMENT Left 10/02/2016   Procedure: CYSTOSCOPY WITH RETROGRADE PYELOGRAM/URETERAL STENT PLACEMENT ureteroscopy, basket extraction;  Surgeon: Marco Severs, MD;  Location: [facility];  Service: Urology;  Laterality: Left;    ESOPHAGOGASTRODUODENOSCOPY (EGD) WITH PROPOFOL  N/A 09/22/2023   Procedure: ESOPHAGOGASTRODUODENOSCOPY (EGD) WITH PROPOFOL ;  Surgeon: Annis Kinder, DO;  [facility];  Service: Gastroenterology;  Laterality: N/A;   HARDWARE REMOVAL N/A 12/22/2016   Procedure: Lumbar Four-Five Removal of Hardware;  Surgeon: Agustina Aldrich, MD;  Location: [facility];  Service: Neurosurgery;  Laterality: N/A;   HEMOSTASIS CLIP PLACEMENT  09/22/2023   Procedure: HEMOSTASIS CLIP PLACEMENT;  Surgeon: Annis Kinder, DO;  [facility];  Service: Gastroenterology;;   INCISION AND DRAINAGE ABSCESS Left 07/19/2013   Procedure: INCISION AND DRAINAGE LEFT LOWER EXTERMITY HEMATOMA;  Surgeon: Kari Otto. Eli Grizzle, MD;  Location: [facility];  Service: General;  Laterality: Left;  Excision left lower leg mass   KNEE CARTILAGE SURGERY     left   KNEE SURGERY Left    x 2, Murphy/Sue, corrected Patella displacement    LAMINECTOMY     LAPAROSCOPIC APPENDECTOMY N/A 08/05/2017   Procedure: APPENDECTOMY LAPAROSCOPIC;  Surgeon: Derral Flick, MD;  [facility]: [facility];  Service: General;  Laterality: N/A;   LIPOSUCTION Bilateral 04/10/2023   Procedure: BILATERAL CHEST WALLS LIPOSUCTION;  Surgeon: Alger Infield, MD;  Location: [facility];  Service: Plastics;  Laterality: Bilateral;   LUMBAR LAMINECTOMY  06/2005   L4-5, Ray   LUMBAR PERCUTANEOUS PEDICLE SCREW 1 LEVEL Left 08/17/2018   Procedure: LUMBAR THREE-FOUR LUMBAR PERCUTANEOUS PEDICLE SCREW;  Surgeon: Agustina Aldrich, MD;  Location: [facility];  Service: Neurosurgery;  Laterality: Left;   MASTECTOMY W/ SENTINEL NODE BIOPSY Bilateral 10/19/2020   Procedure: BILATERAL MASTECTOMY WITH LEFT SENTINEL LYMPH NODE BIOPSY;  Surgeon: Caralyn Chandler, MD;  Location: [facility];  Service: General;  Laterality: Bilateral;   POLYPECTOMY  09/22/2023   Procedure: POLYPECTOMY;  Surgeon: Annis Kinder, DO;  [facility];   Service: Gastroenterology;;   POSTERIOR CERVICAL LAMINECTOMY Right 02/28/2020   Procedure: Right Cervical Six-Seven Laminectomy and Foraminotomy;  Surgeon: Agustina Aldrich, MD;  Location: [facility] OR;  Service: Neurosurgery;  Laterality: Right;  3C   R compressed pronator Right    Sypher, R arm   REMOVAL OF BILATERAL TISSUE EXPANDERS WITH PLACEMENT OF BILATERAL BREAST IMPLANTS Bilateral 01/28/2021   Procedure: REMOVAL OF BILATERAL TISSUE EXPANDERS WITH PLACEMENT OF BILATERAL BREAST IMPLANTS WITH ACELLULAR DERMIS;  Surgeon: Alger Infield, MD;  Location:  [facility];  Service: Plastics;  Laterality: Bilateral;   RESECTION DISTAL CLAVICAL Right 06/30/2017   Procedure: RIGHT RESECTION DISTAL CLAVICAL;  Surgeon: Dayne Even, MD;  [facility]: [facility];  Service: Orthopedics;  Laterality: Right;   SHOULDER ACROMIOPLASTY Right 06/30/2017   Procedure: RIGHT SHOULDER ACROMIOPLASTY;  Surgeon: Dayne Even, MD;  Location: [facility];  Service: Orthopedics;  Laterality: Right;   SHOULDER ARTHROSCOPY Right 06/30/2017   Procedure: ARTHROSCOPY DEBRIDEMENT RIGHT SHOULDER;  Surgeon: Dayne Even, MD;  Location: [facility];  Service: Orthopedics;  Laterality: Right;   SPINAL FUSION     TENDON REPAIR  2013   TENDON REPAIR Right 02/01/2012   TONSILLECTOMY  age 68   TOTAL KNEE ARTHROPLASTY Left 08/12/2022   Procedure: LEFT  TOTAL KNEE ARTHROPLASTY;  Surgeon: Dayne Even, MD;  [facility]: [facility];  Service: Orthopedics;  Laterality: Left;   TOTAL SHOULDER ARTHROPLASTY Right 08/22/2021   Procedure: TOTAL SHOULDER ARTHROPLASTY;  Surgeon: Sammye Cristal, MD;  Location: [facility];  Service: Orthopedics;  Laterality: Right;    Allergies: Chlorquinaldol, Clioquinol, Other, Latex, Lisinopril, Codeine, Hydrocodone , Tape, and Wound dressing adhesive  Medications: Prior to Admission medications   Medication Sig Start Date End Date Taking? Authorizing Provider  Dextromethorphan -buPROPion  ER (AUVELITY ) 45-105 MG TBCR  Take 1 tablet by mouth in the morning and at bedtime.   Yes [provider]  dicyclomine  (BENTYL ) 10 MG capsule Take 1 capsule (10 mg total) by mouth 2 (two) times daily. 08/18/23  Yes Zehr, Jessica D, PA-C  doxepin  (SINEQUAN ) 25 MG capsule Take 25 mg by mouth at bedtime. 07/06/23  Yes [provider]  DULoxetine  (CYMBALTA ) 60 MG capsule Take 120 mg by mouth daily. 05/22/23  Yes [provider]  estradiol  (ESTRACE ) 1 MG tablet TAKE ONE TABLET BY MOUTH ONCE A DAY 07/31/23  Yes Granville Layer, MD  hydrochlorothiazide  (HYDRODIURIL ) 25 MG tablet Take 25 mg by mouth daily.   Yes [provider]  losartan  (COZAAR ) 25 MG tablet Take 25 mg by mouth in the morning and at bedtime.   Yes [provider]  metFORMIN (GLUCOPHAGE-XR) 500 MG 24 hr tablet Take 500 mg by mouth 2 (two) times daily.   Yes [provider]  Multiple Vitamin (MULTIVITAMIN WITH MINERALS) TABS tablet Take 1 tablet by mouth daily.   Yes [provider]  oxyCODONE -acetaminophen  (PERCOCET) 5-325 MG tablet Take 1 tablet by mouth 4 times a day 11/06/22  Yes   QUEtiapine  (SEROQUEL ) 400 MG tablet Take 400 mg by mouth at bedtime.   Yes [provider]  tiZANidine  (ZANAFLEX ) 4 MG tablet Take 1 and 1/2 tablets by mouth 3 times a day as needed Patient taking differently: Take 6 mg by mouth 3 (three) times daily as needed for muscle spasms. 11/06/22  Yes   zolpidem  (AMBIEN ) 10 MG tablet Take 10 mg by mouth at bedtime.   Yes [provider]  albuterol  (PROAIR  HFA) 108 (90 Base) MCG/ACT inhaler INHALE TWO PUFFS EVERY FOUR HOURS AS NEEDED SHORTNESS OF BREATH Patient taking differently: Inhale 2 puffs into the lungs every 6 (six) hours as needed for shortness of breath or wheezing. 08/27/16   Roslyn Coombe, MD  EPINEPHrine  0.3 mg/0.3 mL IJ SOAJ injection Inject 0.3 mLs (0.3 mg total) into the muscle as needed for anaphylaxis. 10/04/19   Adela Holter, DO  omeprazole  (PRILOSEC) 40  MG capsule Take 1 capsule (40 mg total) by mouth 2 (two) times daily for 28 days. Patient taking differently: Take 40 mg by mouth as needed. 07/21/23 10/12/23  Modena Andes, MD  sucralfate  (CARAFATE ) 1 g tablet Take 1 g by mouth 4 (four) times daily. 07/06/23   [provider]     Family History  Problem Relation Age of Onset   Cancer Mother        pancreatic cancer   Heart disease Father 87   Colon polyps Father    Leukemia Father    Dementia Father    Breast cancer Maternal Grandmother    Ovarian cancer Maternal Grandmother 55   Colon cancer Paternal Grandfather        dx < 50   Uterine cancer Other        Grandmother   Breast cancer Other  Grandmother   Ovarian cancer Other        Grandmother   Diabetes Other        mother   Other Neg Hx     Social History   Socioeconomic History   Marital status: Married    Spouse name: Not on file   Number of children: 3   Years of education: Not on file   Highest education level: Not on file  Occupational History   Occupation: TEACHER    Employer: [employer]    Comment: [employer] (6th grade)  Tobacco Use   Smoking status: Never   Smokeless tobacco: Never  Vaping Use   Vaping status: Never Used  Substance and Sexual Activity   Alcohol use: Not Currently   Drug use: No   Sexual activity: Yes    Birth control/protection: Surgical  Other Topics Concern   Not on file  Social History Narrative   Caffeine use- 2 dailyLives c 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 Gwendel Lemme for Pain Medicine    Social Drivers of Health   Financial Resource Strain: Not on file  Food Insecurity: No Food Insecurity (07/17/2023)   Hunger Vital Sign    Worried About Running Out of Food in the Last Year: Never true    Ran Out of Food in the Last Year: Never true  Transportation Needs: No Transportation Needs  (07/17/2023)   PRAPARE - Administrator, Civil Service (Medical): No    Lack of Transportation (Non-Medical): No  Physical Activity: Not on file  Stress: Not on file  Social Connections: Not on file     Review of Systems: A 12 point ROS discussed and pertinent positives are indicated in the HPI above.  All other systems are negative.  Review of Systems  Constitutional:  Negative for fever.  Respiratory:  Negative for cough and shortness of breath.   Cardiovascular:  Negative for chest pain.  Gastrointestinal:  Positive for abdominal pain (right sided) and nausea (occasional). Negative for diarrhea and vomiting.  Psychiatric/Behavioral:  Negative for confusion.     Vital Signs: BP 117/73   Pulse 87   Temp 97.7 F (36.5 C) (Oral)   Resp 13   Ht 5\' 4"  (1.626 m)   Wt 165 lb (74.8 kg)   LMP 12/07/2011   SpO2 100%   BMI 28.32 kg/m     Physical Exam Constitutional:      Appearance: Normal appearance.  HENT:     Head: Normocephalic and atraumatic.  Cardiovascular:     Rate and Rhythm: Regular rhythm. Tachycardia present.  Pulmonary:     Effort: Pulmonary effort is normal.     Breath sounds: Normal breath sounds.  Abdominal:     General: Abdomen is flat.  Skin:    General: Skin is warm.  Neurological:     General: No focal deficit present.     Mental Status: She is alert and oriented to person, place, and time.  Psychiatric:        Mood and Affect: Mood normal.        Thought Content: Thought content normal.     Imaging: No results found.  Labs:  CBC: Recent Labs    06/03/23 0653 07/17/23 0823 07/18/23 0748 12/31/23 1113  WBC 6.6 9.0 5.7 5.3  HGB 11.7* 13.6 14.2 12.4  HCT 36.4 41.3 43.7 37.8  PLT  209 294 232 232    COAGS: Recent Labs    06/03/23 0653 12/31/23 1113  INR 1.1 1.0    BMP: Recent Labs    07/18/23 0748 07/19/23 0714 07/20/23 0555 12/31/23 1113  NA 137 136 137 139  K 4.0 3.5 3.8 3.1*  CL 104 107 106 101  CO2 23  23 24 25   GLUCOSE 93 91 98 88  BUN 14 11 12 13   CALCIUM 9.2 8.3* 8.0* 9.3  CREATININE 1.02* 0.79 0.71 1.42*  GFRNONAA >60 >60 >60 43*    LIVER FUNCTION TESTS: Recent Labs    06/03/23 0653 06/04/23 0511 07/17/23 0823 12/31/23 1113  BILITOT 1.0 0.7 0.7 0.5  AST 23 20 23 18   ALT 19 17 17 12   ALKPHOS 59 50 69 67  PROT 6.0* 5.8* 7.3 6.3*  ALBUMIN 3.5 3.1* 4.1 3.4*    TUMOR MARKERS: No results for input(s): "AFPTM", "CEA", "CA199", "CHROMGRNA" in the last 8760 hours.  Assessment and Plan: Hepatic cyst  Patient is a 58 y/o female with history of anxiety, arthritis, asthma, cancer, fibromyalgia, HTN, obesity, seizures, breast cancer, and cysts in the pancrease and liver. MRI 10/04/23 reported a unchanged, thinly septated fluid signal cystic lesion of the central pancreatic head measuring 1.7 x 1.0 cm. Additionally, there is a tiny sub centimeter fluid signal cystic lesion of the ventral pancreatic neck. 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 Patient presents today for aspiration at the request of Lockie Rima, MD.   Risks and benefits of hepatic cyst aspiration/drainage was discussed with the patient and/or patient's family including, but not limited to bleeding, infection, damage to adjacent structures or low yield requiring additional tests.  All of the questions were answered and there is agreement to proceed.  Consent signed and in chart.   Thank you for this interesting consult.  I greatly enjoyed meeting Melissa Allen and look forward to participating in their care.  A copy of this report was sent to the requesting provider on this date.  Electronically Signed: Lawrance Presume, PA 12/31/2023, 1:14 PM   I spent a total of  30 Minutes   in face to face in clinical consultation, greater than 50% of which was counseling/coordinating care for Hepatic cyst

## 2024-01-04 LAB — CYTOLOGY - NON PAP

## 2024-01-05 LAB — AEROBIC/ANAEROBIC CULTURE W GRAM STAIN (SURGICAL/DEEP WOUND)
Culture: NO GROWTH
Gram Stain: NONE SEEN
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
## 2024-01-25 ENCOUNTER — Ambulatory Visit: Attending: General Surgery

## 2024-01-25 ENCOUNTER — Other Ambulatory Visit: Payer: Self-pay

## 2024-01-25 DIAGNOSIS — M6281 Muscle weakness (generalized): Secondary | ICD-10-CM

## 2024-01-25 DIAGNOSIS — Z483 Aftercare following surgery for neoplasm: Secondary | ICD-10-CM | POA: Diagnosis present

## 2024-01-25 DIAGNOSIS — M25611 Stiffness of right shoulder, not elsewhere classified: Secondary | ICD-10-CM

## 2024-01-25 DIAGNOSIS — R293 Abnormal posture: Secondary | ICD-10-CM | POA: Diagnosis present

## 2024-01-25 DIAGNOSIS — R0789 Other chest pain: Secondary | ICD-10-CM | POA: Diagnosis present

## 2024-01-25 DIAGNOSIS — M25612 Stiffness of left shoulder, not elsewhere classified: Secondary | ICD-10-CM | POA: Diagnosis present

## 2024-02-03 ENCOUNTER — Ambulatory Visit

## 2024-02-09 ENCOUNTER — Ambulatory Visit: Attending: General Surgery

## 2024-02-09 DIAGNOSIS — M25612 Stiffness of left shoulder, not elsewhere classified: Secondary | ICD-10-CM | POA: Insufficient documentation

## 2024-02-09 DIAGNOSIS — R293 Abnormal posture: Secondary | ICD-10-CM | POA: Diagnosis present

## 2024-02-09 DIAGNOSIS — M25611 Stiffness of right shoulder, not elsewhere classified: Secondary | ICD-10-CM | POA: Diagnosis present

## 2024-02-09 DIAGNOSIS — R0789 Other chest pain: Secondary | ICD-10-CM | POA: Insufficient documentation

## 2024-02-09 DIAGNOSIS — M6281 Muscle weakness (generalized): Secondary | ICD-10-CM | POA: Diagnosis present

## 2024-02-09 DIAGNOSIS — Z483 Aftercare following surgery for neoplasm: Secondary | ICD-10-CM | POA: Diagnosis present
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
## 2024-02-12 ENCOUNTER — Ambulatory Visit

## 2024-02-12 DIAGNOSIS — R293 Abnormal posture: Secondary | ICD-10-CM

## 2024-02-12 DIAGNOSIS — M25612 Stiffness of left shoulder, not elsewhere classified: Secondary | ICD-10-CM

## 2024-02-12 DIAGNOSIS — R0789 Other chest pain: Secondary | ICD-10-CM | POA: Diagnosis not present

## 2024-02-12 DIAGNOSIS — Z483 Aftercare following surgery for neoplasm: Secondary | ICD-10-CM

## 2024-02-12 DIAGNOSIS — M25611 Stiffness of right shoulder, not elsewhere classified: Secondary | ICD-10-CM

## 2024-02-12 DIAGNOSIS — M6281 Muscle weakness (generalized): Secondary | ICD-10-CM
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
## 2024-02-12 NOTE — Patient Instructions (Addendum)
 Melissa Allen

## 2024-02-16 ENCOUNTER — Ambulatory Visit

## 2024-02-16 DIAGNOSIS — Z483 Aftercare following surgery for neoplasm: Secondary | ICD-10-CM

## 2024-02-16 DIAGNOSIS — R0789 Other chest pain: Secondary | ICD-10-CM | POA: Diagnosis not present

## 2024-02-16 DIAGNOSIS — R293 Abnormal posture: Secondary | ICD-10-CM

## 2024-02-16 DIAGNOSIS — M25611 Stiffness of right shoulder, not elsewhere classified: Secondary | ICD-10-CM

## 2024-02-16 DIAGNOSIS — M25612 Stiffness of left shoulder, not elsewhere classified: Secondary | ICD-10-CM

## 2024-02-16 DIAGNOSIS — M6281 Muscle weakness (generalized): Secondary | ICD-10-CM

## 2024-02-16 NOTE — Therapy (Addendum)
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 Leg Lengthener    Straighten one leg. Pull toes AND forefoot toward knee, extend heel. Lengthen leg by pulling pelvis away from ribs. Hold _2-3__ seconds. Relax. Repeat __4-6__ times. Do other leg.  Surface: floor   5. Leg Press    Straighten one leg down to floor keeping leg aligned with hip. Pull toes AND forefoot toward knee; extend heel.  Press entire leg downward (as if pressing leg into sandy beach). DO NOT BEND KNEE. Hold _2-3__ seconds. Do __4-6__ times. Repeat with other leg.

## 2024-02-18 ENCOUNTER — Ambulatory Visit

## 2024-02-23 ENCOUNTER — Ambulatory Visit

## 2024-02-26 ENCOUNTER — Encounter

## 2024-03-07 ENCOUNTER — Encounter: Payer: Self-pay | Admitting: Neurology

## 2024-03-07 ENCOUNTER — Other Ambulatory Visit: Payer: Self-pay

## 2024-03-07 ENCOUNTER — Other Ambulatory Visit: Payer: Self-pay | Admitting: Student

## 2024-03-07 DIAGNOSIS — M5412 Radiculopathy, cervical region: Secondary | ICD-10-CM

## 2024-03-07 DIAGNOSIS — R202 Paresthesia of skin: Secondary | ICD-10-CM

## 2024-03-08 ENCOUNTER — Encounter: Payer: Self-pay | Admitting: Student

## 2024-03-18 ENCOUNTER — Ambulatory Visit: Admitting: Neurology

## 2024-03-18 DIAGNOSIS — R202 Paresthesia of skin: Secondary | ICD-10-CM | POA: Diagnosis not present

## 2024-03-18 NOTE — Procedures (Signed)
  [facility]  [address] [place], [address]  [place], [place] [address] Tel: [phone] Fax: [phone] Test Date:  03/18/2024  Patient: Melissa Allen DOB: 1966-06-14 Physician: Tonita Blanch, DO  Sex: Female Height: 5' 4 Ref Phys: Gerard Beck, NP  ID#: [id number]   Technician:    History: This is a 58 year old female referred for evaluation of left upper extremity pain and paresthesias.  NCV & EMG Findings: Electrodiagnostic testing was prematurely terminated at patient's request due to pain.  Findings show: Left median, ulnar, and mixed palmar sensory responses are within normal limits.   Left median motor study was incomplete as patient was unable to tolerate testing.  Impression: This is an incomplete study of the left upper extremity as testing was terminated at patient's request due to pain.  Of the limited results, there is no evidence of carpal tunnel syndrome.   ___________________________ Tonita Blanch, DO    Nerve Conduction Studies   Stim Site NR Peak (ms) Norm Peak (ms) O-P Amp (V) Norm O-P Amp  Left Median Anti Sensory (2nd Digit)  32 C  Wrist    2.9 <3.6 26.3 >15  Left Ulnar Anti Sensory (5th Digit)  32 C  Wrist    2.5 <3.1 30.8 >10     Stim Site NR Onset (ms) Norm Onset (ms) O-P Amp (mV) Norm O-P Amp Site1 Site2 Delta-0 (ms) Dist (cm) Vel (m/s) Norm Vel (m/s)  Left Median Motor (Abd Poll Brev)  32 C  Wrist    3.0 <4.0 *4.4 >6           Stim Site NR Peak (ms) Norm Peak (ms) P-T Amp (V) Site1 Site2 Delta-P (ms) Norm Delta (ms)  Left Median/Ulnar Palm Comparison (Wrist - 8cm)  32 C  Median Palm    1.7 <2.2 87.6 Median Palm Ulnar Palm 0.2   Ulnar Palm    1.5 <2.2 21.2          Waveforms:

## 2024-03-22 ENCOUNTER — Ambulatory Visit
Admission: RE | Admit: 2024-03-22 | Discharge: 2024-03-22 | Disposition: A | Discharge status: Home / Self Care | Source: Ambulatory Visit | Attending: Student

## 2024-03-22 DIAGNOSIS — M5412 Radiculopathy, cervical region: Secondary | ICD-10-CM

## 2024-03-31 ENCOUNTER — Other Ambulatory Visit: Payer: Self-pay | Admitting: Neurosurgery

## 2024-04-05 NOTE — Progress Notes (Signed)
 Surgical Instructions   Your procedure is scheduled on April 12, 2024. Report to [facility] Main Entrance A at 10:50 A.M., then check in with the Admitting office. Any questions or running late day of surgery: call [phone]  Questions prior to your surgery date: call [phone], Monday-Friday, 8am-4pm. If you experience any cold or flu symptoms such as cough, fever, chills, shortness of breath, etc. between now and your scheduled surgery, please notify us  at the above number.     Remember:  Do not eat after midnight the night before your surgery  You may drink clear liquids until 9:50 the morning of your surgery.   Clear liquids allowed are: Water , Non-Citrus Juices (without pulp), Carbonated Beverages, Clear Tea (no milk, honey, etc.), Black Coffee Only (NO MILK, CREAM OR POWDERED CREAMER of any kind), and Gatorade.    Take these medicines the morning of surgery with A SIP OF WATER   DULoxetine  (CYMBALTA   estradiol  (ESTRACE )   May take these medicines IF NEEDED: albuterol  (PROAIR  HFA)  inhaler MAY BRING WITH YOU EPINEPHrine   injection  oxyCODONE -acetaminophen  (PERCOCET)  omeprazole  (PRILOSEC)  tiZANidine  (ZANAFLEX )   WHAT DO I DO ABOUT MY DIABETES MEDICATION?   Do not take oral diabetes medicines metFORMIN (GLUCOPHAGE-XR)   the morning of surgery.    The day of surgery, do not take other diabetes injectables, including Byetta (exenatide), Bydureon (exenatide ER), Victoza (liraglutide), or Trulicity (dulaglutide).  If your CBG is greater than 220 mg/dL, you may take  of your sliding scale (correction) dose of insulin .   HOW TO MANAGE YOUR DIABETES BEFORE AND AFTER SURGERY  Why is it important to control my blood sugar before and after surgery? Improving blood sugar levels before and after surgery helps healing and can limit problems. A way of improving blood sugar control is eating a healthy diet by:  Eating less sugar and carbohydrates  Increasing  activity/exercise  Talking with your doctor about reaching your blood sugar goals High blood sugars (greater than 180 mg/dL) can raise your risk of infections and slow your recovery, so you will need to focus on controlling your diabetes during the weeks before surgery. Make sure that the doctor who takes care of your diabetes knows about your planned surgery including the date and location.  How do I manage my blood sugar before surgery? Check your blood sugar at least 4 times a day, starting 2 days before surgery, to make sure that the level is not too high or low.  Check your blood sugar the morning of your surgery when you wake up and every 2 hours until you get to the Short Stay unit.  If your blood sugar is less than 70 mg/dL, you will need to treat for low blood sugar: Do not take insulin . Treat a low blood sugar (less than 70 mg/dL) with  cup of clear juice (cranberry or apple), 4 glucose tablets, OR glucose gel. Recheck blood sugar in 15 minutes after treatment (to make sure it is greater than 70 mg/dL). If your blood sugar is not greater than 70 mg/dL on recheck, call [phone] for further instructions. Report your blood sugar to the short stay nurse when you get to Short Stay.  If you are admitted to the hospital after surgery: Your blood sugar will be checked by the staff and you will probably be given insulin  after surgery (instead of oral diabetes medicines) to make sure you have good blood sugar levels. The goal for blood sugar control after surgery is 80-180  mg/dL.   One week prior to surgery, STOP taking any Aspirin  (unless otherwise instructed by your surgeon) Aleve, Naproxen, Ibuprofen , Motrin , Advil , Goody's, BC's, all herbal medications, fish oil, and non-prescription vitamins.                     Do NOT Smoke (Tobacco/Vaping) for 24 hours prior to your procedure.  If you use a CPAP at night, you may bring your mask/headgear for your overnight stay.   You will be  asked to remove any contacts, glasses, piercing's, hearing aid's, dentures/partials prior to surgery. Please bring cases for these items if needed.    Patients discharged the day of surgery will not be allowed to drive home, and someone needs to stay with them for 24 hours.  SURGICAL WAITING ROOM VISITATION Patients may have no more than 2 support people in the waiting area - these visitors may rotate.   Pre-op nurse will coordinate an appropriate time for 1 ADULT support person, who may not rotate, to accompany patient in pre-op.  Children under the age of 16 must have an adult with them who is not the patient and must remain in the main waiting area with an adult.  If the patient needs to stay at the hospital during part of their recovery, the visitor guidelines for inpatient rooms apply.  Please refer to the [facility] website for the visitor guidelines for any additional information.   If you received a COVID test during your pre-op visit  it is requested that you wear a mask when out in public, stay away from anyone that may not be feeling well and notify your surgeon if you develop symptoms. If you have been in contact with anyone that has tested positive in the last 10 days please notify you surgeon.      Pre-operative 5 CHG Bathing Instructions   You can play a key role in reducing the risk of infection after surgery. Your skin needs to be as free of germs as possible. You can reduce the number of germs on your skin by washing with CHG (chlorhexidine  gluconate) soap before surgery. CHG is an antiseptic soap that kills germs and continues to kill germs even after washing.   DO NOT use if you have an allergy  to chlorhexidine /CHG or antibacterial soaps. If your skin becomes reddened or irritated, stop using the CHG and notify one of our RNs at [phone].   Please shower with the CHG soap starting 4 days before surgery using the following schedule:     Please keep in mind the  following:  DO NOT shave, including legs and underarms, starting the day of your first shower.   You may shave your face at any point before/day of surgery.  Place clean sheets on your bed the day you start using CHG soap. Use a clean washcloth (not used since being washed) for each shower. DO NOT sleep with pets once you start using the CHG.   CHG Shower Instructions:  Wash your face and private area with normal soap. If you choose to wash your hair, wash first with your normal shampoo.  After you use shampoo/soap, rinse your hair and body thoroughly to remove shampoo/soap residue.  Turn the water  OFF and apply about 3 tablespoons (45 ml) of CHG soap to a CLEAN washcloth.  Apply CHG soap ONLY FROM YOUR NECK DOWN TO YOUR TOES (washing for 3-5 minutes)  DO NOT use CHG soap on face, private areas, open wounds,  or sores.  Pay special attention to the area where your surgery is being performed.  If you are having back surgery, having someone wash your back for you may be helpful. Wait 2 minutes after CHG soap is applied, then you may rinse off the CHG soap.  Pat dry with a clean towel  Put on clean clothes/pajamas   If you choose to wear lotion, please use ONLY the CHG-compatible lotions that are listed below.  Additional instructions for the day of surgery: DO NOT APPLY any lotions, deodorants, cologne, or perfumes.   Do not bring valuables to the hospital. [facility] is not responsible for any belongings/valuables. Do not wear nail polish, gel polish, artificial nails, or any other type of covering on natural nails (fingers and toes) Do not wear jewelry or makeup Put on clean/comfortable clothes.  Please brush your teeth.  Ask your nurse before applying any prescription medications to the skin.     CHG Compatible Lotions   Aveeno Moisturizing lotion  Cetaphil Moisturizing Cream  Cetaphil Moisturizing Lotion  Clairol Herbal Essence Moisturizing Lotion, Dry Skin  Clairol Herbal  Essence Moisturizing Lotion, Extra Dry Skin  Clairol Herbal Essence Moisturizing Lotion, Normal Skin  Curel Age Defying Therapeutic Moisturizing Lotion with Alpha Hydroxy  Curel Extreme Care Body Lotion  Curel Soothing Hands Moisturizing Hand Lotion  Curel Therapeutic Moisturizing Cream, Fragrance-Free  Curel Therapeutic Moisturizing Lotion, Fragrance-Free  Curel Therapeutic Moisturizing Lotion, Original Formula  Eucerin Daily Replenishing Lotion  Eucerin Dry Skin Therapy Plus Alpha Hydroxy Crme  Eucerin Dry Skin Therapy Plus Alpha Hydroxy Lotion  Eucerin Original Crme  Eucerin Original Lotion  Eucerin Plus Crme Eucerin Plus Lotion  Eucerin TriLipid Replenishing Lotion  Keri Anti-Bacterial Hand Lotion  Keri Deep Conditioning Original Lotion Dry Skin Formula Softly Scented  Keri Deep Conditioning Original Lotion, Fragrance Free Sensitive Skin Formula  Keri Lotion Fast Absorbing Fragrance Free Sensitive Skin Formula  Keri Lotion Fast Absorbing Softly Scented Dry Skin Formula  Keri Original Lotion  Keri Skin Renewal Lotion Keri Silky Smooth Lotion  Keri Silky Smooth Sensitive Skin Lotion  Nivea Body Creamy Conditioning Oil  Nivea Body Extra Enriched Lotion  Nivea Body Original Lotion  Nivea Body Sheer Moisturizing Lotion Nivea Crme  Nivea Skin Firming Lotion  NutraDerm 30 Skin Lotion  NutraDerm Skin Lotion  NutraDerm Therapeutic Skin Cream  NutraDerm Therapeutic Skin Lotion  ProShield Protective Hand Cream  Provon moisturizing lotion  Please read over the following fact sheets that you were given.

## 2024-04-06 ENCOUNTER — Other Ambulatory Visit: Payer: Self-pay

## 2024-04-06 ENCOUNTER — Encounter (HOSPITAL_COMMUNITY)
Admission: RE | Admit: 2024-04-06 | Discharge: 2024-04-06 | Disposition: A | Discharge status: Home / Self Care | Source: Ambulatory Visit | Attending: Neurosurgery | Admitting: Neurosurgery

## 2024-04-06 ENCOUNTER — Encounter (HOSPITAL_COMMUNITY): Payer: Self-pay

## 2024-04-06 VITALS — BP 126/57 | HR 131 | Temp 98.0°F | Resp 18 | Ht 63.0 in | Wt 157.0 lb

## 2024-04-06 DIAGNOSIS — M797 Fibromyalgia: Secondary | ICD-10-CM | POA: Insufficient documentation

## 2024-04-06 DIAGNOSIS — E119 Type 2 diabetes mellitus without complications: Secondary | ICD-10-CM | POA: Diagnosis not present

## 2024-04-06 DIAGNOSIS — Z9013 Acquired absence of bilateral breasts and nipples: Secondary | ICD-10-CM | POA: Insufficient documentation

## 2024-04-06 DIAGNOSIS — J45909 Unspecified asthma, uncomplicated: Secondary | ICD-10-CM | POA: Insufficient documentation

## 2024-04-06 DIAGNOSIS — M4802 Spinal stenosis, cervical region: Secondary | ICD-10-CM | POA: Insufficient documentation

## 2024-04-06 DIAGNOSIS — Z853 Personal history of malignant neoplasm of breast: Secondary | ICD-10-CM | POA: Insufficient documentation

## 2024-04-06 DIAGNOSIS — Z01818 Encounter for other preprocedural examination: Secondary | ICD-10-CM

## 2024-04-06 DIAGNOSIS — Z01812 Encounter for preprocedural laboratory examination: Secondary | ICD-10-CM | POA: Insufficient documentation

## 2024-04-06 DIAGNOSIS — I10 Essential (primary) hypertension: Secondary | ICD-10-CM | POA: Diagnosis not present

## 2024-04-06 HISTORY — DX: Depression, unspecified: F32.A

## 2024-04-06 HISTORY — DX: Type 2 diabetes mellitus without complications: E11.9

## 2024-04-06 LAB — COMPREHENSIVE METABOLIC PANEL WITH GFR
ALT: 16 U/L (ref 0–44)
AST: 22 U/L (ref 15–41)
Albumin: 3.7 g/dL (ref 3.5–5.0)
Alkaline Phosphatase: 71 U/L (ref 38–126)
Anion gap: 11 (ref 5–15)
BUN: 14 mg/dL (ref 6–20)
CO2: 26 mmol/L (ref 22–32)
Calcium: 9.6 mg/dL (ref 8.9–10.3)
Chloride: 99 mmol/L (ref 98–111)
Creatinine, Ser: 1.07 mg/dL — ABNORMAL HIGH (ref 0.44–1.00)
GFR, Estimated: 60 mL/min — ABNORMAL LOW (ref >60)
Glucose, Bld: 106 mg/dL — ABNORMAL HIGH (ref 70–99)
Potassium: 3.2 mmol/L — ABNORMAL LOW (ref 3.5–5.1)
Sodium: 136 mmol/L (ref 135–145)
Total Bilirubin: 0.6 mg/dL (ref 0.0–1.2)
Total Protein: 6.7 g/dL (ref 6.5–8.1)

## 2024-04-06 LAB — TYPE AND SCREEN
ABO/RH(D): AB POS
Antibody Screen: NEGATIVE

## 2024-04-06 LAB — CBC
HCT: 40.7 % (ref 36.0–46.0)
Hemoglobin: 13.2 g/dL (ref 12.0–15.0)
MCH: 29.9 pg (ref 26.0–34.0)
MCHC: 32.4 g/dL (ref 30.0–36.0)
MCV: 92.1 fL (ref 80.0–100.0)
Platelets: 283 K/uL (ref 150–400)
RBC: 4.42 MIL/uL (ref 3.87–5.11)
RDW: 13.3 % (ref 11.5–15.5)
WBC: 5.5 K/uL (ref 4.0–10.5)
nRBC: 0 % (ref 0.0–0.2)

## 2024-04-06 LAB — SURGICAL PCR SCREEN
MRSA, PCR: NEGATIVE
Staphylococcus aureus: NEGATIVE

## 2024-04-06 LAB — GLUCOSE, CAPILLARY: Glucose-Capillary: 161 mg/dL — ABNORMAL HIGH (ref 70–99)

## 2024-04-06 LAB — HEMOGLOBIN A1C
Hgb A1c MFr Bld: 4.8 % (ref 4.8–5.6)
Mean Plasma Glucose: 91.06 mg/dL

## 2024-04-06 NOTE — Progress Notes (Signed)
 Surgical Instructions   Your procedure is scheduled on Tuesday, April 12, 2024. Report to [facility] Main Entrance A at 11 A.M., then check in with the Admitting office. Any questions or running late day of surgery: call [phone]  Questions prior to your surgery date: call [phone], Monday-Friday, 8am-4pm. If you experience any cold or flu symptoms such as cough, fever, chills, shortness of breath, etc. between now and your scheduled surgery, please notify us  at the above number.     Remember:  Do not eat after midnight the night before your surgery-Mon  You may drink clear liquids until 9:50 the morning of your surgery-Tues.   Clear liquids allowed are: Water , Non-Citrus Juices (without pulp), Carbonated Beverages, Clear Tea (no milk, honey, etc.), Black Coffee Only (NO MILK, CREAM OR POWDERED CREAMER of any kind), and Gatorade.    Take these medicines the morning of surgery with A SIP OF WATER   DULoxetine  (CYMBALTA   estradiol  (ESTRACE )   May take these medicines IF NEEDED: albuterol  (PROAIR  HFA)  inhaler MAY BRING WITH YOU EPINEPHrine   injection  oxyCODONE -acetaminophen  (PERCOCET)  omeprazole  (PRILOSEC)  tiZANidine  (ZANAFLEX )   WHAT DO I DO ABOUT MY DIABETES MEDICATION?  Do not take oral diabetes medicines metFORMIN (GLUCOPHAGE-XR)   the morning of surgery.   One week prior to surgery, STOP taking any Aspirin  (unless otherwise instructed by your surgeon) Aleve, Naproxen, Ibuprofen , Motrin , Advil , Goody's, BC's, all herbal medications, fish oil, and non-prescription vitamins.                     Do NOT Smoke (Tobacco/Vaping) for 24 hours prior to your procedure.  If you use a CPAP at night, you may bring your mask/headgear for your overnight stay.   You will be asked to remove any contacts, glasses, piercing's, hearing aid's, dentures/partials prior to surgery. Please bring cases for these items if needed.    Patients discharged the day of surgery will not be  allowed to drive home, and someone needs to stay with them for 24 hours.  SURGICAL WAITING ROOM VISITATION Patients may have no more than 2 support people in the waiting area - these visitors may rotate.   Pre-op nurse will coordinate an appropriate time for 1 ADULT support person, who may not rotate, to accompany patient in pre-op.  Children under the age of 81 must have an adult with them who is not the patient and must remain in the main waiting area with an adult.  If the patient needs to stay at the hospital during part of their recovery, the visitor guidelines for inpatient rooms apply.  Please refer to the [facility] website for the visitor guidelines for any additional information.   If you received a COVID test during your pre-op visit  it is requested that you wear a mask when out in public, stay away from anyone that may not be feeling well and notify your surgeon if you develop symptoms. If you have been in contact with anyone that has tested positive in the last 10 days please notify you surgeon.      Pre-operative 5 CHG Bathing Instructions   You can play a key role in reducing the risk of infection after surgery. Your skin needs to be as free of germs as possible. You can reduce the number of germs on your skin by washing with CHG (chlorhexidine  gluconate) soap before surgery. CHG is an antiseptic soap that kills germs and continues to kill germs even after washing.  DO NOT use if you have an allergy  to chlorhexidine /CHG or antibacterial soaps. If your skin becomes reddened or irritated, stop using the CHG and notify one of our RNs at [phone].   Please shower with the CHG soap starting 4 days before surgery using the following schedule:     Please keep in mind the following:  DO NOT shave, including legs and underarms, starting the day of your first shower.   You may shave your face at any point before/day of surgery.  Place clean sheets on your bed the day you  start using CHG soap. Use a clean washcloth (not used since being washed) for each shower. DO NOT sleep with pets once you start using the CHG.   CHG Shower Instructions:  Wash your face and private area with normal soap. If you choose to wash your hair, wash first with your normal shampoo.  After you use shampoo/soap, rinse your hair and body thoroughly to remove shampoo/soap residue.  Turn the water  OFF and apply about 3 tablespoons (45 ml) of CHG soap to a CLEAN washcloth.  Apply CHG soap ONLY FROM YOUR NECK DOWN TO YOUR TOES (washing for 3-5 minutes)  DO NOT use CHG soap on face, private areas, open wounds, or sores.  Pay special attention to the area where your surgery is being performed.  If you are having back surgery, having someone wash your back for you may be helpful. Wait 2 minutes after CHG soap is applied, then you may rinse off the CHG soap.  Pat dry with a clean towel  Put on clean clothes/pajamas   If you choose to wear lotion, please use ONLY the CHG-compatible lotions that are listed below.  Additional instructions for the day of surgery: DO NOT APPLY any lotions, deodorants, cologne, or perfumes.   Do not bring valuables to the hospital. [facility] is not responsible for any belongings/valuables. Do not wear nail polish, gel polish, artificial nails, or any other type of covering on natural nails (fingers and toes) Do not wear jewelry or makeup Put on clean/comfortable clothes.  Please brush your teeth.  Ask your nurse before applying any prescription medications to the skin.     CHG Compatible Lotions   Aveeno Moisturizing lotion  Cetaphil Moisturizing Cream  Cetaphil Moisturizing Lotion  Clairol Herbal Essence Moisturizing Lotion, Dry Skin  Clairol Herbal Essence Moisturizing Lotion, Extra Dry Skin  Clairol Herbal Essence Moisturizing Lotion, Normal Skin  Curel Age Defying Therapeutic Moisturizing Lotion with Alpha Hydroxy  Curel Extreme Care Body Lotion   Curel Soothing Hands Moisturizing Hand Lotion  Curel Therapeutic Moisturizing Cream, Fragrance-Free  Curel Therapeutic Moisturizing Lotion, Fragrance-Free  Curel Therapeutic Moisturizing Lotion, Original Formula  Eucerin Daily Replenishing Lotion  Eucerin Dry Skin Therapy Plus Alpha Hydroxy Crme  Eucerin Dry Skin Therapy Plus Alpha Hydroxy Lotion  Eucerin Original Crme  Eucerin Original Lotion  Eucerin Plus Crme Eucerin Plus Lotion  Eucerin TriLipid Replenishing Lotion  Keri Anti-Bacterial Hand Lotion  Keri Deep Conditioning Original Lotion Dry Skin Formula Softly Scented  Keri Deep Conditioning Original Lotion, Fragrance Free Sensitive Skin Formula  Keri Lotion Fast Absorbing Fragrance Free Sensitive Skin Formula  Keri Lotion Fast Absorbing Softly Scented Dry Skin Formula  Keri Original Lotion  Keri Skin Renewal Lotion Keri Silky Smooth Lotion  Keri Silky Smooth Sensitive Skin Lotion  Nivea Body Creamy Conditioning Oil  Nivea Body Extra Enriched Teacher, adult education Moisturizing Lotion Nivea Crme  Nivea  Skin Firming Lotion  NutraDerm 30 Skin Lotion  NutraDerm Skin Lotion  NutraDerm Therapeutic Skin Cream  NutraDerm Therapeutic Skin Lotion  ProShield Protective Hand Cream  Provon moisturizing lotion  Please read over the following fact sheets that you were given.

## 2024-04-06 NOTE — Progress Notes (Signed)
 PCP - Dr Darice Henle Cardiologist -  none Pain Mgmt - Dr Oneil Ellen  Chest x-ray - 06/02/23 EKG - 07/17/23 Stress Test - n/a ECHO - 07/21/19 Cardiac Cath - n/a  ICD Pacemaker/Loop - n/a  Sleep Study -  n/a  Diabetes Type 2, does not check blood sugar. Do not take Metformin on the DOS.  Aspirin  & Blood Thinner Instructions:  n/a  ERAS - clear liquids til 9:45 AM DOS.  Anesthesia review: Yes  STOP now taking any Aspirin  (unless otherwise instructed by your surgeon), Aleve, Naproxen, Ibuprofen , Motrin , Advil , Goody's, BC's, all herbal medications, fish oil, and all vitamins.   Coronavirus Screening Do you have any of the following symptoms:  Cough yes/no: No Fever (>100.44F)  yes/no: No Runny nose yes/no: No Sore throat yes/no: No Difficulty breathing/shortness of breath  yes/no: No  Have you traveled in the last 14 days and where? yes/no: No  Patient verbalized understanding of instructions that were given to them at the PAT appointment. Patient was also instructed that they will need to review over the PAT instructions again at home before surgery.

## 2024-04-07 NOTE — Progress Notes (Addendum)
 Anesthesia Chart Review:  Case: [id number] Date/Time: 04/12/24 1245   Procedure: ANTERIOR CERVICAL DECOMPRESSION/DISCECTOMY FUSION 2 LEVELS - ACDF - C3-C4 - C4-C5   Anesthesia type: General   Diagnosis: Cervical spinal stenosis [M48.02]   Pre-op diagnosis: Cervical spinal stenosis   [facility] / [facility]   Surgeons: Louis Shove, MD       DISCUSSION: Patient is a 58 year old female scheduled for the above procedure.  History includes never smoker, post-operative N/V, HTN, DM2, asthma, exertional dyspnea, chest pain (2011, evaluated by cardiologist Dr. 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  She is a clinical FYI of difficult airway. She did not recall a specific history of this but noted limited neck mobility. In review of [facility] records, she has had multiple surgeries, many requiring intubation. The first mention of DIFFICULT AIRWAY was on 08/05/17 due to large tongee and limited oral opening. Mask ventilation was without difficulty. A 7.0 mm ETT was placed using MAC and 4, 1 attempt.  She was later classified as anticipated difficulty on 02/28/20 due to neck instability, reduced neck mobility, anterior larynx, and limited oral opening. A Videoscope was planned due to neck  instability. For subsequent surgeries requiring intubation, a Glidescope was used given her history. Most recently on 04/10/23, Glidescope and 3 used to place 7.00 ETT, 1 attempt. Grade 1 view. Difficulty was anticipated due to reduced neck mobility and limited oral opening.  Recommended induction with short acting agents and alternative techniques readily available.  She also had a delayed allergic reaction following endoscopy on 09/28/19 (blisters and ulcers inside her mouth and swelling around her left eye, no difficulty breathing or throat swelling). She had an allergy  evaluation at [facility] with patch test positive for fragrance mix (1+) and Quinoline mix (1+). Gold Sodium Thiosulfate was +/- at 48 hours, but not listed as reactive at 96 hour check. Samples of oxygen  mask, pink tubing, mouthpiece, mouth strap from endoscopy center also tested but not listed as being reactive. Recommendation to Avoid products that list any of the following names in the ingredients: Clioquinol, Chlorquinaldol, Chloriodquin, Iodochlorhydroxyquin, Sterosan, Vioform. Propofol  was used for EGD/colonoscopy. She also reported localized hives on her thigh when pink tape was used to secure a foley catheter in 1986. She also has a Latex allergy .   Chart forwarded for review after patient had left her PAT visit. Noted that pulse documented as 131 bpm. BP 126/57. In review of previous VS trends, HR is often in the 120's dating back to at least 2022. EKG from 07/2023 showed ST at 123 bpm (during admission for dehydration secondary to diarrhea with AKI). For endoscopy procedures on 09/22/23 HR 97-114 bpm. At GI visit on 08/18/23 HR 134. She reported HR typically higher when at medical visits as she is very anxious. She also has chronic pain, and said HR was checked shortly after arriving to PAT without time to rest. She denied history of hyperthyroidism or afib. She is asymptomatic  of her tachycardia. She denied  palpitations, chest pain, SOB at rest, edema, dizziness, syncope. She reported stable DOE with activities such as walking up a flight of stairs.  She believes Dr. Burney checks her thyroid  function yearly. She is on BP medications, but says that HR rate lowering medications have never been recommended. She may have a BP cuff at home that also measures HR, but has not used it in a while and would have to find it. She feels anxious about being in the hospital and having another surgery, so feels her HR will be elevated at least initially. She is particularly anxious about getting a PIV as she is a difficult stick (has had an ankle IV in the past or IV team or anesthesia team has had to start using ultrasound guidance. If she is able to check her HR in a more relaxing environment for her then I encouraged her to call me with the results. In the interim, I will attempt to get last labs and office notes from Dr. Burney. Patient says she was seen there within the past 3 months. (ADDENDUM 04/11/24 6:21 PM: Last PCP records received today and are scanned under Media tab. TSH normal at 1.48 on 06/26/23. HR 105 bpm at 02/24/24 office visit.)   VS: BP (!) 126/57   Pulse (!) 131   Temp 36.7 C   Resp 18   Ht 5' 3 (1.6 m)   Wt 71.2 kg   LMP 12/07/2011   SpO2 100%   BMI 27.81 kg/m     PROVIDERS: Burney Darice CROME, MD Aron Shoulders, MD is general surgery (hepatic cyst) Abran Rush, MD is GI Orlando Anes, MD is Pain Management   LABS: Labs reviewed: Acceptable for surgery. (all labs ordered are listed, but only abnormal results are displayed)  Labs Reviewed  GLUCOSE, CAPILLARY - Abnormal; Notable for the following components:      Result Value   Glucose-Capillary 161 (*)    All other components within normal limits  COMPREHENSIVE METABOLIC PANEL WITH GFR - Abnormal; Notable for the following components:   Potassium 3.2 (*)    Glucose, Bld 106 (*)    Creatinine, Ser 1.07 (*)    GFR, Estimated 60  (*)    All other components within normal limits  SURGICAL PCR SCREEN  CBC  HEMOGLOBIN A1C  TYPE AND SCREEN    OTHER: Colonoscopy 09/22/23 Impression: Preparation of the colon was fair. Stool in the entire examined colon. Normal mucosa in the entire examined colon.  Biopsied.  MR conditional clip was placed. (Right and left colon biopsies showed no specific histopathologic changes, negative for acute inflammation, increased intraepithelial lymphocytes or thickened Sab epithelial collagen table). The distal rectum and anal verge were normal on retroflexion view. The examined portion of the ileum was normal. Repeat colonoscopy in 1 year for screening purposes due to suboptimal prep quality of the study.  Recommend extended 2-day prep with repeat procedure.  EGD 09/22/23: Impression: Normal esophagus. Z-line regular, 35 cm from the incisors. Gastritis. Biopsied (mild nonspecific reactive gastropathy, no specific histopathologic changes, no H. pylori identified). A few gastric polyps, resected and retrieved (negative for dysplasia). Normal examined duodenum.  Biopsied (no specific histopathologic changes negative for increased intraepithelial lymphocytes or villous architectural changes).   IMAGES: MRI C-spine 03/22/24: IMPRESSION: 1. Anterior cervical fusion from C5 through C7. 2. Moderate to severe canal stenosis at C3-C4 secondary to disc osteophyte complex and facet arthropathy. 3. Severe left and moderate right foraminal stenosis  at C3-C4 secondary to uncovertebral joint disease and facet arthropathy. 4. Severe left foraminal stenosis at C4-C5. Moderate right foraminal stenosis at C6-C7.  CTA Abd/pelvis 03/02/24 ([facility]): IMPRESSION:  1. Deep inferior epigastric artery perforator mapping as detailed above.  2. There 2 small micronodules in the right lower lobe measuring up to 4  mm, nonspecific and may be infectious or inflammatory. Recommend  comparison to outside prior to  ensure stability of which outside imaging  study reports indicate no reported nodules (CT chest 08/26/2021).   MRI Abd 10/04/23: IMPRESSION: 1. Unchanged, thinly septated fluid signal cystic lesion of the central pancreatic head measuring 1.7 x 1.0 cm. Additional tiny subcentimeter fluid signal cystic lesion of the ventral pancreatic neck. No solid component or suspicious contrast enhancement. 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 As previously reported, these lesions are essentially unchanged over numerous prior examinations dating back to at least 03/26/2012 and are therefore benign, requiring no further follow-up or characterization. 2. Trace bilateral pleural effusions. - Per GI Dr. Norleen Kiang, Please let the patient know that her MRI shows no change in her very tiny pancreatic cyst.  No change in over 10 years.  Radiology does not recommend any further follow-up.  However, given her family history, I would like a repeat MRI of the pancreas in 2 years.  US  Renal Artery Duplex 08/28/23: Summary:  - Largest Aortic Diameter: 2.2 cm  - Renal:  Right: Normal size right kidney. Normal right Resistive Index. Normal cortical thickness of right kidney. No evidence of right renal artery stenosis. RRV flow present.  Left:  Normal size of left kidney. Normal left Resistive Index. Normal cortical thickness of the left kidney. No evidence of  left renal artery stenosis. LRV flow present.  - Mesenteric:  Normal Celiac artery and Superior Mesenteric artery findings.  Patent IVC.    EKG: 07/17/23: Sinus tachycardia at 123 bpm Right axis deviation Low voltage, extremity and precordial leads Nonspecific T abnormalities, lateral leads New since previous tracing Confirmed by Zackowski, Scott [phone]) on 07/17/2023 8:45:02 AM   CV: Echo 07/21/19: IMPRESSIONS   1. Left ventricular ejection fraction, by visual estimation, is 55 to  60%. The left ventricle has  normal function. There is no left ventricular  hypertrophy.   2. Left ventricular diastolic parameters are indeterminate.   3. The left ventricle has no regional wall motion abnormalities.   4. Global right ventricle has normal systolic function.The right  ventricular size is normal. No increase in right ventricular wall  thickness.   5. Left atrial size was normal.   6. Right atrial size was normal.   7. Presence of pericardial fat pad.   8. Trivial pericardial effusion is present.   9. Mild mitral annular calcification.  10. The mitral valve is grossly normal. Trace mitral valve regurgitation.  11. The tricuspid valve is grossly normal. Tricuspid valve regurgitation  is trivial.  12. The aortic valve is tricuspid. Aortic valve regurgitation is not  visualized. No evidence of aortic valve sclerosis or stenosis.  13. The pulmonic valve was grossly normal. Pulmonic valve regurgitation is  not visualized.  14. Normal pulmonary artery systolic pressure.  15. The tricuspid regurgitant velocity is 2.19 m/s, and with an assumed  right atrial pressure of 8 mmHg, the estimated right ventricular systolic  pressure is normal at 27.2 mmHg.  16. The inferior vena cava is normal in size with <50% respiratory  variability, suggesting right atrial pressure of 8  mmHg.    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/2023   was drained w/ anesthesia   Calcaneal fracture    Cancer (HCC)    breast cancer   Depression    Diabetes mellitus without complication (HCC)    type 2, does not check blood sugar, on metformin    Duplicated ureter, right    Family history of breast cancer    Family history  of colon cancer    Family history of ovarian cancer    Family history of pancreatic cancer    Fibromyalgia    History of kidney stones    Passed stones and surg to remove stones   Hypertension    Obesity (BMI 30.0-34.9)    Pancreatitis 1986   Pneumonia    x 1   PONV (postoperative nausea and vomiting)    Poor venous access    hard to start iv-   Seizures (HCC) 1986   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, was treated /w phenobarbital for a while, then taken off - 02/24/2020 occurred 30 years ago per patient   Shortness of breath dyspnea    with exertion   Wears glasses     Past Surgical History:  Procedure Laterality Date   ABDOMINAL HYSTERECTOMY     ANTERIOR LAT LUMBAR FUSION Left 08/17/2018   Procedure: LUMBAR THREE-FOUR ANTERIOR LATERAL INTERBODY FUSION;  Surgeon: Louis Shove, MD;  [facility];  Service: Neurosurgery;  Laterality: Left;   APPENDECTOMY  08/05/2017   BIOPSY  09/22/2023   Procedure: BIOPSY;  Surgeon: San Sandor [name], DO;  [facility];  Service: Gastroenterology;;   BLADDER SUSPENSION  03/01/2012   Procedure: TRANSVAGINAL TAPE (TVT) PROCEDURE;  Surgeon: Harland JAYSON Birkenhead, MD;  Location: [facility];  Service: Gynecology;  Laterality: N/A;   BREAST CAPSULECTOMY WITH IMPLANT EXCHANGE Bilateral 04/10/2023   Procedure: BREAST CAPSULORRHAPHY WITH SILICONE IMPLANT EXCHANGE;  Surgeon: Arelia Filippo, MD;  Location: [facility];  Service: Plastics;  Laterality: Bilateral;   BREAST RECONSTRUCTION WITH PLACEMENT OF TISSUE EXPANDER AND ALLODERM Bilateral 10/19/2020   Procedure: BILATERAL BREAST RECONSTRUCTION WITH PLACEMENT OF TISSUE EXPANDER AND ALLODERM;  Surgeon: Arelia Filippo, MD;  Location: [facility];  Service: Plastics;  Laterality: Bilateral;   BUNIONECTOMY  1995   Right   CHOLECYSTECTOMY  late 1980's       COLONOSCOPY WITH PROPOFOL  N/A 09/22/2023   Procedure: COLONOSCOPY WITH PROPOFOL ;  Surgeon: San Sandor [name], DO;  [facility];  Service: Gastroenterology;  Laterality: N/A;   CYSTOSCOPY  03/01/2012   Procedure: CYSTOSCOPY;  Surgeon: Harland JAYSON Birkenhead, MD;  [facility];  Service: Gynecology;  Laterality: N/A;   CYSTOSCOPY W/ URETERAL STENT PLACEMENT Left 12/15/2014   Procedure: CYSTOSCOPY, LEFT URETEROSCOPYU WITH STONE EXTRACTION;  Surgeon: Norleen Seltzer, MD;  [facility];  Service: Urology;  Laterality: Left;   CYSTOSCOPY W/ URETERAL STENT PLACEMENT Left 10/02/2016   Procedure: CYSTOSCOPY WITH RETROGRADE PYELOGRAM/URETERAL STENT PLACEMENT ureteroscopy, basket extraction;  Surgeon: Belvie LITTIE Clara, MD;  [facility];  Service: Urology;  Laterality: Left;   ESOPHAGOGASTRODUODENOSCOPY (EGD) WITH PROPOFOL  N/A 09/22/2023  Procedure: ESOPHAGOGASTRODUODENOSCOPY (EGD) WITH PROPOFOL ;  Surgeon: San Sandor [name], DO;  [facility];  Service: Gastroenterology;  Laterality: N/A;   HARDWARE REMOVAL N/A 12/22/2016   Procedure: Lumbar Four-Five Removal of Hardware;  Surgeon: Victory Gunnels, MD;  Location: [facility] OR;  Service: Neurosurgery;  Laterality: N/A;   HEMOSTASIS CLIP PLACEMENT  09/22/2023   Procedure: HEMOSTASIS CLIP PLACEMENT;  Surgeon: San Sandor [name], DO;  [facility];  Service: Gastroenterology;;   INCISION AND DRAINAGE ABSCESS Left 07/19/2013   Procedure: INCISION AND DRAINAGE LEFT LOWER EXTERMITY HEMATOMA;  Surgeon: Donnice POUR. Belinda, MD;  Location: [facility];  Service: General;  Laterality: Left;  Excision left lower leg mass   KNEE CARTILAGE SURGERY     left   KNEE SURGERY Left    x 2, Murphy/Sue, corrected Patella displacement    LAMINECTOMY     LAPAROSCOPIC APPENDECTOMY N/A 08/05/2017   Procedure: APPENDECTOMY LAPAROSCOPIC;  Surgeon: Stevie Herlene Righter, MD;  [facility];  Service: General;  Laterality: N/A;   LIPOSUCTION Bilateral 04/10/2023   Procedure: BILATERAL CHEST WALLS LIPOSUCTION;  Surgeon: Arelia Filippo, MD;  Location: [facility];   Service: Plastics;  Laterality: Bilateral;   LUMBAR LAMINECTOMY  06/2005   L4-5, Ray   LUMBAR PERCUTANEOUS PEDICLE SCREW 1 LEVEL Left 08/17/2018   Procedure: LUMBAR THREE-FOUR LUMBAR PERCUTANEOUS PEDICLE SCREW;  Surgeon: Gunnels Victory, MD;  [facility];  Service: Neurosurgery;  Laterality: Left;   MASTECTOMY W/ SENTINEL NODE BIOPSY Bilateral 10/19/2020   Procedure: BILATERAL MASTECTOMY WITH LEFT SENTINEL LYMPH NODE BIOPSY;  Surgeon: Curvin Deward MOULD, MD;  Location: [facility];  Service: General;  Laterality: Bilateral;   POLYPECTOMY  09/22/2023   Procedure: POLYPECTOMY;  Surgeon: San Sandor [name], DO;  [facility];  Service: Gastroenterology;;   POSTERIOR CERVICAL LAMINECTOMY Right 02/28/2020   Procedure: Right Cervical Six-Seven Laminectomy and Foraminotomy;  Surgeon: Gunnels Victory, MD;  Location: [facility];  Service: Neurosurgery;  Laterality: Right;  3C   R compressed pronator Right    Sypher, R arm   REMOVAL OF BILATERAL TISSUE EXPANDERS WITH PLACEMENT OF BILATERAL BREAST IMPLANTS Bilateral 01/28/2021   Procedure: REMOVAL OF BILATERAL TISSUE EXPANDERS WITH PLACEMENT OF BILATERAL BREAST IMPLANTS WITH ACELLULAR DERMIS;  Surgeon: Arelia Filippo, MD;  Location: [facility];  Service: Plastics;  Laterality: Bilateral;   RESECTION DISTAL CLAVICAL Right 06/30/2017   Procedure: RIGHT RESECTION DISTAL CLAVICAL;  Surgeon: Sheril Coy, MD;  [facility];  Service: Orthopedics;  Laterality: Right;   SHOULDER ACROMIOPLASTY Right 06/30/2017   Procedure: RIGHT SHOULDER ACROMIOPLASTY;  Surgeon: Sheril Coy, MD;  [facility];  Service: Orthopedics;  Laterality: Right;   SHOULDER ARTHROSCOPY Right 06/30/2017   Procedure: ARTHROSCOPY DEBRIDEMENT RIGHT SHOULDER;  Surgeon: Sheril Coy, MD;  [facility];  Service: Orthopedics;  Laterality: Right;   SPINAL FUSION     TENDON REPAIR  2013   TENDON REPAIR Right 02/01/2012   TONSILLECTOMY  age 81   TOTAL  KNEE ARTHROPLASTY Left 08/12/2022   Procedure: LEFT TOTAL KNEE ARTHROPLASTY;  Surgeon: Sheril Coy, MD;  [facility];  Service: Orthopedics;  Laterality: Left;   TOTAL SHOULDER ARTHROPLASTY Right 08/22/2021   Procedure: TOTAL SHOULDER ARTHROPLASTY;  Surgeon: Dozier Soulier, MD;  [facility];  Service: Orthopedics;  Laterality: Right;    MEDICATIONS:  albuterol  (PROAIR  HFA) 108 (90 Base) MCG/ACT inhaler   Dextromethorphan -buPROPion  ER (AUVELITY ) 45-105 MG TBCR   doxepin  (SINEQUAN ) 25 MG capsule   DULoxetine  (  CYMBALTA ) 60 MG capsule   EPINEPHrine  0.3 mg/0.3 mL IJ SOAJ injection   estradiol  (ESTRACE ) 1 MG tablet   hydrochlorothiazide  (HYDRODIURIL ) 25 MG tablet   losartan  (COZAAR ) 50 MG tablet   MAGNESIUM  PO   metFORMIN  (GLUCOPHAGE -XR) 500 MG 24 hr tablet   Multiple Vitamin (MULTIVITAMIN WITH MINERALS) TABS tablet   omeprazole  (PRILOSEC) 40 MG capsule   PERCOCET 10-325 MG tablet   QUEtiapine  (SEROQUEL ) 400 MG tablet   tiZANidine  (ZANAFLEX ) 4 MG tablet   triamterene -hydrochlorothiazide  (MAXZIDE -25) 37.5-25 MG tablet   zolpidem  (AMBIEN  CR) 12.5 MG CR tablet   No current facility-administered medications for this encounter.    Isaiah Ruder, PA-C Surgical Short Stay/Anesthesiology [facility] Phone [phone] [facility] Phone [phone] 04/08/2024 10:19 AM

## 2024-04-08 NOTE — Anesthesia Preprocedure Evaluation (Addendum)
 Anesthesia Evaluation  Patient identified by MRN, date of birth, ID band Patient awake    Reviewed: Allergy  & Precautions, NPO status , Patient's Chart, lab work & pertinent test results  History of Anesthesia Complications (+) PONV, DIFFICULT AIRWAY and history of anesthetic complications  Airway Mallampati: II  TM Distance: >3 FB Neck ROM: Full    Dental no notable dental hx. (+) Teeth Intact, Dental Advisory Given   Pulmonary shortness of breath, asthma    Pulmonary exam normal breath sounds clear to auscultation       Cardiovascular hypertension, Normal cardiovascular exam Rhythm:Regular Rate:Normal     Neuro/Psych Seizures - (once 1986), Well Controlled,  PSYCHIATRIC DISORDERS Anxiety Depression     Neuromuscular disease    GI/Hepatic ,GERD  ,,  Endo/Other  diabetes, Type 2    Renal/GU Renal diseaseLab Results      Component                Value               Date                               K                        3.2 (L)             04/06/2024                         CREATININE               1.07 (H)            04/06/2024                GFRNONAA                 60 (L)              04/06/2024                        Musculoskeletal  (+) Arthritis ,  Fibromyalgia -  Abdominal   Peds  Hematology Lab Results      Component                Value               Date                      WBC                      5.5                 04/06/2024                HGB                      13.2                04/06/2024                HCT                      40.7                04/06/2024  MCV                      92.1                04/06/2024                PLT                      283                 04/06/2024              Anesthesia Other Findings ALL: latex, lisinopril codeine, hydrocodone   Breast CA  Hx of difficult IV access  Reproductive/Obstetrics                               Anesthesia Physical Anesthesia Plan  ASA: 3  Anesthesia Plan: General   Post-op Pain Management: Ofirmev  IV (intra-op)*, Precedex  and Ketamine  IV*   Induction: Intravenous  PONV Risk Score and Plan: Treatment may vary due to age or medical condition, Midazolam , Dexamethasone , Ondansetron , Scopolamine  patch - Pre-op, Propofol  infusion and TIVA  Airway Management Planned: Oral ETT and Video Laryngoscope Planned  Additional Equipment: None  Intra-op Plan:   Post-operative Plan: Extubation in OR  Informed Consent: I have reviewed the patients History and Physical, chart, labs and discussed the procedure including the risks, benefits and alternatives for the proposed anesthesia with the patient or authorized representative who has indicated his/her understanding and acceptance.     Dental advisory given  Plan Discussed with: CRNA and Surgeon  Anesthesia Plan Comments: (PAT note written 04/08/2024 by Allison Zelenak, PA-C. History of elective Glidescope due to anticipated difficult airway due to limited neck mobility, mouth opening, anterior larynx.  Reported being a difficult IV stick. Often IV or anesthesia team has had to start PIV using US  guidance.     )         Anesthesia Quick Evaluation

## 2024-04-12 ENCOUNTER — Encounter (HOSPITAL_COMMUNITY): Payer: Self-pay | Admitting: Neurosurgery

## 2024-04-12 ENCOUNTER — Other Ambulatory Visit: Payer: Self-pay

## 2024-04-12 ENCOUNTER — Ambulatory Visit (HOSPITAL_COMMUNITY): Payer: Self-pay | Admitting: Vascular Surgery

## 2024-04-12 ENCOUNTER — Encounter (HOSPITAL_COMMUNITY)
Admission: RE | Disposition: A | Discharge status: Home / Self Care | Payer: Self-pay | Source: Home / Self Care | Attending: Neurosurgery

## 2024-04-12 ENCOUNTER — Observation Stay (HOSPITAL_COMMUNITY)
Admission: RE | Admit: 2024-04-12 | Discharge: 2024-04-13 | Disposition: A | Discharge status: Home / Self Care | Attending: Neurosurgery | Admitting: Neurosurgery

## 2024-04-12 ENCOUNTER — Ambulatory Visit (HOSPITAL_COMMUNITY)

## 2024-04-12 ENCOUNTER — Ambulatory Visit (HOSPITAL_COMMUNITY): Payer: Self-pay | Admitting: Anesthesiology

## 2024-04-12 DIAGNOSIS — M4802 Spinal stenosis, cervical region: Secondary | ICD-10-CM

## 2024-04-12 DIAGNOSIS — I10 Essential (primary) hypertension: Secondary | ICD-10-CM

## 2024-04-12 DIAGNOSIS — E119 Type 2 diabetes mellitus without complications: Secondary | ICD-10-CM | POA: Insufficient documentation

## 2024-04-12 DIAGNOSIS — Z91040 Latex allergy status: Secondary | ICD-10-CM | POA: Insufficient documentation

## 2024-04-12 DIAGNOSIS — F418 Other specified anxiety disorders: Secondary | ICD-10-CM

## 2024-04-12 DIAGNOSIS — M4712 Other spondylosis with myelopathy, cervical region: Secondary | ICD-10-CM | POA: Diagnosis present

## 2024-04-12 DIAGNOSIS — J45909 Unspecified asthma, uncomplicated: Secondary | ICD-10-CM | POA: Diagnosis not present

## 2024-04-12 DIAGNOSIS — M4722 Other spondylosis with radiculopathy, cervical region: Secondary | ICD-10-CM | POA: Insufficient documentation

## 2024-04-12 HISTORY — PX: ANTERIOR CERVICAL DECOMP/DISCECTOMY FUSION: SHX1161

## 2024-04-12 LAB — GLUCOSE, CAPILLARY
Glucose-Capillary: 101 mg/dL — ABNORMAL HIGH (ref 70–99)
Glucose-Capillary: 110 mg/dL — ABNORMAL HIGH (ref 70–99)
Glucose-Capillary: 148 mg/dL — ABNORMAL HIGH (ref 70–99)

## 2024-04-12 SURGERY — ANTERIOR CERVICAL DECOMPRESSION/DISCECTOMY FUSION 2 LEVELS
Anesthesia: General

## 2024-04-12 MED ORDER — ONDANSETRON HCL 4 MG/2ML IJ SOLN: 4.0000 mg | Freq: Four times a day (QID) | INTRAMUSCULAR | Status: DC | PRN

## 2024-04-12 MED ORDER — PANTOPRAZOLE SODIUM 40 MG PO TBEC: 40.0000 mg | DELAYED_RELEASE_TABLET | Freq: Every day | ORAL | Status: DC

## 2024-04-12 MED ORDER — ESTRADIOL 0.5 MG PO TABS
1.0000 mg | ORAL_TABLET | Freq: Every day | ORAL | Status: DC
  Filled 2024-04-12 (×2): qty 2

## 2024-04-12 MED ORDER — PHENYLEPHRINE 80 MCG/ML (10ML) SYRINGE FOR IV PUSH (FOR BLOOD PRESSURE SUPPORT)
PREFILLED_SYRINGE | INTRAVENOUS | Status: DC | PRN
  Administered 2024-04-12 (×2): 80 ug via INTRAVENOUS

## 2024-04-12 MED ORDER — AMISULPRIDE (ANTIEMETIC) 5 MG/2ML IV SOLN: 10.0000 mg | Freq: Once | INTRAVENOUS | Status: DC | PRN

## 2024-04-12 MED ORDER — ROCURONIUM BROMIDE 10 MG/ML (PF) SYRINGE
PREFILLED_SYRINGE | INTRAVENOUS | Status: DC | PRN
  Administered 2024-04-12: 10 mg via INTRAVENOUS
  Administered 2024-04-12: 20 mg via INTRAVENOUS
  Administered 2024-04-12: 60 mg via INTRAVENOUS
  Administered 2024-04-12: 20 mg via INTRAVENOUS

## 2024-04-12 MED ORDER — ACETAMINOPHEN 10 MG/ML IV SOLN
INTRAVENOUS | Status: AC
  Filled 2024-04-12: qty 100

## 2024-04-12 MED ORDER — ONDANSETRON HCL 4 MG/2ML IJ SOLN
INTRAMUSCULAR | Status: DC | PRN
  Administered 2024-04-12: 4 mg via INTRAVENOUS

## 2024-04-12 MED ORDER — LIDOCAINE 2% (20 MG/ML) 5 ML SYRINGE
INTRAMUSCULAR | Status: DC | PRN
  Administered 2024-04-12: 100 mg via INTRAVENOUS

## 2024-04-12 MED ORDER — ONDANSETRON HCL 4 MG PO TABS: 4.0000 mg | ORAL_TABLET | Freq: Four times a day (QID) | ORAL | Status: DC | PRN

## 2024-04-12 MED ORDER — TRIAMTERENE-HCTZ 37.5-25 MG PO TABS
1.0000 | ORAL_TABLET | Freq: Every day | ORAL | Status: DC
  Filled 2024-04-12: qty 1

## 2024-04-12 MED ORDER — HYDROMORPHONE HCL 1 MG/ML IJ SOLN
0.2500 mg | INTRAMUSCULAR | Status: DC | PRN
  Administered 2024-04-12 (×4): 0.5 mg via INTRAVENOUS

## 2024-04-12 MED ORDER — SODIUM CHLORIDE 0.9 % IV SOLN
250.0000 mL | INTRAVENOUS | Status: DC
  Administered 2024-04-12: 250 mL via INTRAVENOUS

## 2024-04-12 MED ORDER — SODIUM CHLORIDE 0.9% FLUSH
3.0000 mL | Freq: Two times a day (BID) | INTRAVENOUS | Status: DC
  Administered 2024-04-12 (×2): 3 mL via INTRAVENOUS

## 2024-04-12 MED ORDER — PROPOFOL 10 MG/ML IV BOLUS
INTRAVENOUS | Status: AC
  Filled 2024-04-12: qty 20

## 2024-04-12 MED ORDER — HYDROMORPHONE HCL 1 MG/ML IJ SOLN
INTRAMUSCULAR | Status: AC
  Filled 2024-04-12: qty 1

## 2024-04-12 MED ORDER — DEXTROMETHORPHAN-BUPROPION ER 45-105 MG PO TBCR: 1.0000 | EXTENDED_RELEASE_TABLET | Freq: Two times a day (BID) | ORAL | Status: DC

## 2024-04-12 MED ORDER — SCOPOLAMINE 1 MG/3DAYS TD PT72: 1.0000 | MEDICATED_PATCH | TRANSDERMAL | Status: DC

## 2024-04-12 MED ORDER — CHLORHEXIDINE GLUCONATE CLOTH 2 % EX PADS: 6.0000 | MEDICATED_PAD | Freq: Once | CUTANEOUS | Status: DC

## 2024-04-12 MED ORDER — OXYCODONE HCL 5 MG PO TABS
ORAL_TABLET | ORAL | Status: AC
Start: 2024-04-12 — End: 2024-04-12
  Filled 2024-04-12: qty 1

## 2024-04-12 MED ORDER — DOXEPIN HCL 25 MG PO CAPS
75.0000 mg | ORAL_CAPSULE | Freq: Every day | ORAL | Status: DC
  Administered 2024-04-12: 75 mg via ORAL
  Filled 2024-04-12: qty 3
  Filled 2024-04-12: qty 1
  Filled 2024-04-12: qty 3

## 2024-04-12 MED ORDER — FENTANYL CITRATE (PF) 250 MCG/5ML IJ SOLN
INTRAMUSCULAR | Status: DC | PRN
  Administered 2024-04-12: 100 ug via INTRAVENOUS
  Administered 2024-04-12 (×3): 50 ug via INTRAVENOUS

## 2024-04-12 MED ORDER — DEXAMETHASONE SODIUM PHOSPHATE 10 MG/ML IJ SOLN
INTRAMUSCULAR | Status: DC | PRN
  Administered 2024-04-12: 5 mg via INTRAVENOUS

## 2024-04-12 MED ORDER — CEFAZOLIN SODIUM-DEXTROSE 1-4 GM/50ML-% IV SOLN
1.0000 g | Freq: Three times a day (TID) | INTRAVENOUS | Status: AC
  Administered 2024-04-12 – 2024-04-13 (×2): 1 g via INTRAVENOUS
  Filled 2024-04-12 (×2): qty 50

## 2024-04-12 MED ORDER — OXYCODONE-ACETAMINOPHEN 5-325 MG PO TABS
1.0000 | ORAL_TABLET | ORAL | Status: DC | PRN
  Administered 2024-04-12 – 2024-04-13 (×5): 2 via ORAL
  Filled 2024-04-12 (×5): qty 2

## 2024-04-12 MED ORDER — ROCURONIUM BROMIDE 10 MG/ML (PF) SYRINGE
PREFILLED_SYRINGE | INTRAVENOUS | Status: AC
  Filled 2024-04-12: qty 10

## 2024-04-12 MED ORDER — THROMBIN (RECOMBINANT) 5000 UNITS EX SOLR
CUTANEOUS | Status: AC
  Filled 2024-04-12: qty 10000

## 2024-04-12 MED ORDER — METFORMIN HCL ER 500 MG PO TB24
500.0000 mg | ORAL_TABLET | Freq: Two times a day (BID) | ORAL | Status: DC
  Administered 2024-04-12: 500 mg via ORAL
  Filled 2024-04-12: qty 1

## 2024-04-12 MED ORDER — HYDROMORPHONE HCL 1 MG/ML IJ SOLN
0.2500 mg | INTRAMUSCULAR | Status: DC | PRN
  Administered 2024-04-12: 0.5 mg via INTRAVENOUS

## 2024-04-12 MED ORDER — MIDAZOLAM HCL 2 MG/2ML IJ SOLN
INTRAMUSCULAR | Status: AC
Start: 2024-04-12 — End: 2024-04-12
  Filled 2024-04-12: qty 2

## 2024-04-12 MED ORDER — MIDAZOLAM HCL 2 MG/2ML IJ SOLN
INTRAMUSCULAR | Status: DC | PRN
  Administered 2024-04-12: 2 mg via INTRAVENOUS

## 2024-04-12 MED ORDER — 0.9 % SODIUM CHLORIDE (POUR BTL) OPTIME
TOPICAL | Status: DC | PRN
  Administered 2024-04-12: 1000 mL

## 2024-04-12 MED ORDER — PROPOFOL 1000 MG/100ML IV EMUL
INTRAVENOUS | Status: AC
  Filled 2024-04-12: qty 300

## 2024-04-12 MED ORDER — ACETAMINOPHEN 650 MG RE SUPP: 650.0000 mg | RECTAL | Status: DC | PRN

## 2024-04-12 MED ORDER — KETAMINE HCL 50 MG/5ML IJ SOSY
PREFILLED_SYRINGE | INTRAMUSCULAR | Status: AC
  Filled 2024-04-12: qty 5

## 2024-04-12 MED ORDER — DEXAMETHASONE SODIUM PHOSPHATE 10 MG/ML IJ SOLN
INTRAMUSCULAR | Status: AC
Start: 2024-04-12 — End: 2024-04-12
  Filled 2024-04-12: qty 1

## 2024-04-12 MED ORDER — ACETAMINOPHEN 10 MG/ML IV SOLN: 1000.0000 mg | Freq: Once | INTRAVENOUS | Status: DC | PRN

## 2024-04-12 MED ORDER — SUGAMMADEX SODIUM 200 MG/2ML IV SOLN
INTRAVENOUS | Status: DC | PRN
  Administered 2024-04-12: 150 mg via INTRAVENOUS

## 2024-04-12 MED ORDER — ONDANSETRON HCL 4 MG/2ML IJ SOLN: 4.0000 mg | Freq: Once | INTRAMUSCULAR | Status: DC | PRN

## 2024-04-12 MED ORDER — HYDROMORPHONE HCL 1 MG/ML IJ SOLN: 1.0000 mg | INTRAMUSCULAR | Status: DC | PRN

## 2024-04-12 MED ORDER — ONDANSETRON HCL 4 MG/2ML IJ SOLN
INTRAMUSCULAR | Status: AC
  Filled 2024-04-12: qty 2

## 2024-04-12 MED ORDER — ALBUTEROL SULFATE (2.5 MG/3ML) 0.083% IN NEBU: 2.5000 mg | INHALATION_SOLUTION | Freq: Four times a day (QID) | RESPIRATORY_TRACT | Status: DC | PRN

## 2024-04-12 MED ORDER — TIZANIDINE HCL 4 MG PO TABS
6.0000 mg | ORAL_TABLET | Freq: Three times a day (TID) | ORAL | Status: DC | PRN
  Filled 2024-04-12: qty 1

## 2024-04-12 MED ORDER — OXYCODONE HCL 5 MG PO TABS
5.0000 mg | ORAL_TABLET | Freq: Once | ORAL | Status: AC | PRN
  Administered 2024-04-12: 5 mg via ORAL

## 2024-04-12 MED ORDER — ORAL CARE MOUTH RINSE: 15.0000 mL | Freq: Once | OROMUCOSAL | Status: AC

## 2024-04-12 MED ORDER — ALBUTEROL SULFATE HFA 108 (90 BASE) MCG/ACT IN AERS: 2.0000 | INHALATION_SPRAY | Freq: Four times a day (QID) | RESPIRATORY_TRACT | Status: DC | PRN

## 2024-04-12 MED ORDER — KETAMINE HCL 10 MG/ML IJ SOLN
INTRAMUSCULAR | Status: DC | PRN
  Administered 2024-04-12 (×2): 10 mg via INTRAVENOUS
  Administered 2024-04-12: 20 mg via INTRAVENOUS

## 2024-04-12 MED ORDER — MENTHOL 3 MG MT LOZG
1.0000 | LOZENGE | OROMUCOSAL | Status: DC | PRN
  Administered 2024-04-12: 3 mg via ORAL
  Filled 2024-04-12: qty 9

## 2024-04-12 MED ORDER — PROPOFOL 10 MG/ML IV BOLUS
INTRAVENOUS | Status: DC | PRN
  Administered 2024-04-12: 150 mg via INTRAVENOUS
  Administered 2024-04-12: 125 ug/kg/min via INTRAVENOUS

## 2024-04-12 MED ORDER — SODIUM CHLORIDE 0.9% FLUSH: 3.0000 mL | INTRAVENOUS | Status: DC | PRN

## 2024-04-12 MED ORDER — QUETIAPINE FUMARATE 200 MG PO TABS
400.0000 mg | ORAL_TABLET | Freq: Every day | ORAL | Status: DC
  Administered 2024-04-12: 400 mg via ORAL
  Filled 2024-04-12 (×2): qty 2

## 2024-04-12 MED ORDER — CHLORHEXIDINE GLUCONATE 0.12 % MT SOLN
OROMUCOSAL | Status: AC
  Administered 2024-04-12: 15 mL via OROMUCOSAL
  Filled 2024-04-12: qty 15

## 2024-04-12 MED ORDER — LACTATED RINGERS IV SOLN: INTRAVENOUS | Status: DC

## 2024-04-12 MED ORDER — MAGNESIUM CHLORIDE 64 MG PO TBEC
1.0000 | DELAYED_RELEASE_TABLET | Freq: Every day | ORAL | Status: DC
  Filled 2024-04-12: qty 1

## 2024-04-12 MED ORDER — ADULT MULTIVITAMIN W/MINERALS CH: 1.0000 | ORAL_TABLET | Freq: Every day | ORAL | Status: DC

## 2024-04-12 MED ORDER — HYDROCHLOROTHIAZIDE 25 MG PO TABS: 25.0000 mg | ORAL_TABLET | Freq: Every day | ORAL | Status: DC

## 2024-04-12 MED ORDER — ZOLPIDEM TARTRATE 5 MG PO TABS
5.0000 mg | ORAL_TABLET | Freq: Every evening | ORAL | Status: DC | PRN
  Administered 2024-04-12: 5 mg via ORAL
  Filled 2024-04-12: qty 1

## 2024-04-12 MED ORDER — TIZANIDINE HCL 4 MG PO TABS
6.0000 mg | ORAL_TABLET | Freq: Three times a day (TID) | ORAL | Status: DC | PRN
  Administered 2024-04-12 – 2024-04-13 (×2): 6 mg via ORAL
  Filled 2024-04-12 (×2): qty 2

## 2024-04-12 MED ORDER — SCOPOLAMINE 1 MG/3DAYS TD PT72
MEDICATED_PATCH | TRANSDERMAL | Status: AC
  Administered 2024-04-12: 1.5 mg via TRANSDERMAL
  Filled 2024-04-12: qty 1

## 2024-04-12 MED ORDER — LOSARTAN POTASSIUM 50 MG PO TABS: 50.0000 mg | ORAL_TABLET | Freq: Two times a day (BID) | ORAL | Status: DC

## 2024-04-12 MED ORDER — LIDOCAINE 2% (20 MG/ML) 5 ML SYRINGE
INTRAMUSCULAR | Status: AC
Start: 2024-04-12 — End: 2024-04-12
  Filled 2024-04-12: qty 5

## 2024-04-12 MED ORDER — OXYCODONE HCL 5 MG/5ML PO SOLN: 5.0000 mg | Freq: Once | ORAL | Status: AC | PRN

## 2024-04-12 MED ORDER — CHLORHEXIDINE GLUCONATE 0.12 % MT SOLN: 15.0000 mL | Freq: Once | OROMUCOSAL | Status: AC

## 2024-04-12 MED ORDER — DULOXETINE HCL 60 MG PO CPEP: 120.0000 mg | ORAL_CAPSULE | Freq: Every day | ORAL | Status: DC

## 2024-04-12 MED ORDER — ACETAMINOPHEN 325 MG PO TABS: 650.0000 mg | ORAL_TABLET | ORAL | Status: DC | PRN

## 2024-04-12 MED ORDER — PHENOL 1.4 % MT LIQD: 1.0000 | OROMUCOSAL | Status: DC | PRN

## 2024-04-12 MED ORDER — CEFAZOLIN SODIUM-DEXTROSE 2-4 GM/100ML-% IV SOLN
2.0000 g | INTRAVENOUS | Status: AC
  Administered 2024-04-12: 2 g via INTRAVENOUS

## 2024-04-12 MED ORDER — CEFAZOLIN SODIUM-DEXTROSE 2-4 GM/100ML-% IV SOLN
INTRAVENOUS | Status: AC
  Filled 2024-04-12: qty 100

## 2024-04-12 MED ORDER — FENTANYL CITRATE (PF) 250 MCG/5ML IJ SOLN
INTRAMUSCULAR | Status: AC
  Filled 2024-04-12: qty 5

## 2024-04-12 MED ORDER — ACETAMINOPHEN 10 MG/ML IV SOLN
INTRAVENOUS | Status: DC | PRN
  Administered 2024-04-12: 1000 mg via INTRAVENOUS

## 2024-04-12 MED ORDER — THROMBIN (RECOMBINANT) 5000 UNITS EX SOLR
CUTANEOUS | Status: DC | PRN
  Administered 2024-04-12: 10 mL via TOPICAL

## 2024-04-12 SURGICAL SUPPLY — 46 items
BAG COUNTER SPONGE SURGICOUNT (BAG) ×1 IMPLANT
BAND RUBBER #18 3X1/16 STRL (MISCELLANEOUS) ×2 IMPLANT
BENZOIN TINCTURE PRP APPL 2/3 (GAUZE/BANDAGES/DRESSINGS) ×1 IMPLANT
BIT DRILL 13 (BIT) IMPLANT
BUR MATCHSTICK NEURO 3.0 LAGG (BURR) ×1 IMPLANT
CANISTER SUCTION 3000ML PPV (SUCTIONS) ×1 IMPLANT
CLSR STERI-STRIP ANTIMIC 1/2X4 (GAUZE/BANDAGES/DRESSINGS) IMPLANT
DRAPE C-ARM 42X72 X-RAY (DRAPES) ×2 IMPLANT
DRAPE INCISE IOBAN 66X45 STRL (DRAPES) IMPLANT
DRAPE LAPAROTOMY 100X72 PEDS (DRAPES) ×1 IMPLANT
DRAPE MICROSCOPE SLANT 54X150 (MISCELLANEOUS) ×1 IMPLANT
DURAPREP 6ML APPLICATOR 50/CS (WOUND CARE) ×1 IMPLANT
ELECT COATED BLADE 2.86 ST (ELECTRODE) ×1 IMPLANT
ELECTRODE REM PT RTRN 9FT ADLT (ELECTROSURGICAL) ×1 IMPLANT
GAUZE 4X4 16PLY ~~LOC~~+RFID DBL (SPONGE) IMPLANT
GAUZE SPONGE 4X4 12PLY STRL (GAUZE/BANDAGES/DRESSINGS) ×1 IMPLANT
GAUZE SPONGE 4X4 12PLY STRL LF (GAUZE/BANDAGES/DRESSINGS) IMPLANT
GLOVE ECLIPSE 9.0 STRL (GLOVE) ×1 IMPLANT
GLOVE EXAM NITRILE XL STR (GLOVE) IMPLANT
GOWN STRL REUS W/ TWL LRG LVL3 (GOWN DISPOSABLE) IMPLANT
GOWN STRL REUS W/ TWL XL LVL3 (GOWN DISPOSABLE) ×1 IMPLANT
GOWN STRL REUS W/TWL 2XL LVL3 (GOWN DISPOSABLE) IMPLANT
HALTER HD/CHIN CERV TRACTION D (MISCELLANEOUS) ×1 IMPLANT
HEMOSTAT POWDER KIT SURGIFOAM (HEMOSTASIS) IMPLANT
HEMOSTAT SURGICEL 2X14 (HEMOSTASIS) IMPLANT
KIT BASIN OR (CUSTOM PROCEDURE TRAY) ×1 IMPLANT
KIT TURNOVER KIT B (KITS) ×1 IMPLANT
NDL SPNL 20GX3.5 QUINCKE YW (NEEDLE) ×1 IMPLANT
NEEDLE SPNL 20GX3.5 QUINCKE YW (NEEDLE) ×1 IMPLANT
NS IRRIG 1000ML POUR BTL (IV SOLUTION) ×1 IMPLANT
PACK LAMINECTOMY NEURO (CUSTOM PROCEDURE TRAY) ×1 IMPLANT
PAD ARMBOARD POSITIONER FOAM (MISCELLANEOUS) ×3 IMPLANT
PATTIES SURGICAL .75X.75 (GAUZE/BANDAGES/DRESSINGS) IMPLANT
PLATE ELITE 42MM (Plate) IMPLANT
SCREW ST 13X4XST VA NS SPNE (Screw) IMPLANT
SPACER BONE CORNERSTONE 6X14 (Orthopedic Implant) IMPLANT
SPONGE INTESTINAL PEANUT (DISPOSABLE) ×1 IMPLANT
SPONGE SURGIFOAM ABS GEL SZ50 (HEMOSTASIS) ×1 IMPLANT
STRIP CLOSURE SKIN 1/2X4 (GAUZE/BANDAGES/DRESSINGS) ×1 IMPLANT
SUT VIC AB 3-0 SH 8-18 (SUTURE) ×1 IMPLANT
SUT VIC AB 4-0 RB1 18 (SUTURE) ×1 IMPLANT
TAPE CLOTH 4X10 WHT NS (GAUZE/BANDAGES/DRESSINGS) ×1 IMPLANT
TOWEL GREEN STERILE (TOWEL DISPOSABLE) ×1 IMPLANT
TOWEL GREEN STERILE FF (TOWEL DISPOSABLE) ×1 IMPLANT
TRAP SPECIMEN MUCUS 40CC (MISCELLANEOUS) ×1 IMPLANT
WATER STERILE IRR 1000ML POUR (IV SOLUTION) ×1 IMPLANT

## 2024-04-12 NOTE — Transfer of Care (Signed)
 Immediate Anesthesia Transfer of Care Note  Patient: Melissa Allen  Procedure(s) Performed: ANTERIOR CERVICAL DECOMPRESSION/DISCECTOMY FUSION 2 LEVELS  Patient Location: PACU  Anesthesia Type:General  Level of Consciousness: awake, alert , and sedated  Airway & Oxygen  Therapy: Patient Spontanous Breathing and Patient connected to face mask oxygen   Post-op Assessment: Report given to RN and Post -op Vital signs reviewed and stable  Post vital signs: Reviewed and stable  Last Vitals:  Vitals Value Taken Time  BP 112/82 04/12/24 14:34  Temp    Pulse 99 04/12/24 14:36  Resp 24 04/12/24 14:36  SpO2 100 % 04/12/24 14:36  Vitals shown include unfiled device data.  Last Pain:  Vitals:   04/12/24 1436  TempSrc:   PainSc: 10-Worst pain ever         Complications: No notable events documented.

## 2024-04-12 NOTE — Plan of Care (Signed)

## 2024-04-12 NOTE — Brief Op Note (Signed)
 04/12/2024  2:12 PM  PATIENT:  Melissa Allen  58 y.o. female  PRE-OPERATIVE DIAGNOSIS:  Cervical spinal stenosis  POST-OPERATIVE DIAGNOSIS:  Cervical spinal stenosis  PROCEDURE:  Procedure(s) with comments: ANTERIOR CERVICAL DECOMPRESSION/DISCECTOMY FUSION 2 LEVELS (N/A) - ACDF - C3-C4 - C4-C5  SURGEON:  Surgeons and Role:    DEWAINE Louis Shove, MD - Primary  PHYSICIAN ASSISTANT:   ASSISTANTSBETHA Jennetta PIETY   ANESTHESIA:   general  EBL:  30cc   BLOOD ADMINISTERED:none  DRAINS: none   LOCAL MEDICATIONS USED:  NONE  SPECIMEN:  No Specimen  DISPOSITION OF SPECIMEN:  N/A  COUNTS:  YES  TOURNIQUET:  * No tourniquets in log *  DICTATION: .Dragon Dictation  PLAN OF CARE: Admit for overnight observation  PATIENT DISPOSITION:  PACU - hemodynamically stable.   Delay start of Pharmacological VTE agent (>24hrs) due to surgical blood loss or risk of bleeding: yes

## 2024-04-12 NOTE — Progress Notes (Signed)
 Orthopedic Tech Progress Note Patient Details:  Melissa Allen Jan 31, 1966 [id number]  Patient has on collar   Patient ID: [name], female   DOB: 1966-04-13, 58 y.o.   MRN: [id number]  Delanna LITTIE Pac 04/12/2024, 4:56 PM

## 2024-04-12 NOTE — H&P (Signed)
 Melissa Allen is an 58 y.o. female.   Chief Complaint: Neck pain HPI: 59 year old female remotely status post C5-6 and C6-7 anterior cervical discectomy and fusion with good results presents now with worsening neck and bilateral upper extremity symptoms which is failed conservative management her workup demonstrates evidence of marked spondylosis and disc degeneration at C3-4 and C4-5.  Patient with dynamic instability at C3-4.  Patient with moderately severe stenosis at C3-4 with some early cord signal change.  Patient also with significant spondylosis and foraminal stenosis and mild spinal stenosis at C4-5.  Patient presents now for C3-4 and C4-5 anterior cervical discectomy and fusion.  This will require removal of her prior instrumentation from C5-C7.  Past Medical History:  Diagnosis Date   Allergy     Anginal pain (HCC)    one episode 4-5 years ago-[facility] [place]-nonspecific-no problems since-   Anxiety    Arthritis    multiple areas of arthritis- DDD   Asthma    recent admit to ER 01/11/12 for exac of asthma; no problems w/ashtma in years per pt on 04/06/24   Benign liver cyst 12/2023   was drained w/ anesthesia   Calcaneal fracture    Cancer (HCC)    breast cancer   Depression    Diabetes mellitus without complication (HCC)    type 2, does not check blood sugar, on metformin    Duplicated ureter, right    Family history of breast cancer    Family history of colon cancer    Family history of ovarian cancer    Family history of pancreatic cancer    Fibromyalgia    History of kidney stones    Passed stones and surg to remove stones   Hypertension    Obesity (BMI 30.0-34.9)    Pancreatitis 1986   Pneumonia    x 1   PONV (postoperative nausea and vomiting)    Poor venous access    hard to start iv-   Seizures (HCC) 1986   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, was treated /w phenobarbital for a while, then taken off - 02/24/2020 occurred 30 years ago per patient   Shortness of  breath dyspnea    with exertion   Wears glasses     Past Surgical History:  Procedure Laterality Date   ABDOMINAL HYSTERECTOMY     ANTERIOR LAT LUMBAR FUSION Left 08/17/2018   Procedure: LUMBAR THREE-FOUR ANTERIOR LATERAL INTERBODY FUSION;  Surgeon: Louis Shove, MD;  Location: [facility];  Service: Neurosurgery;  Laterality: Left;   APPENDECTOMY  08/05/2017   BIOPSY  09/22/2023   Procedure: BIOPSY;  Surgeon: San Sandor [name], DO;  Location: [facility];  Service: Gastroenterology;;   BLADDER SUSPENSION  03/01/2012   Procedure: TRANSVAGINAL TAPE (TVT) PROCEDURE;  Surgeon: Harland JAYSON Birkenhead, MD;  Location: [facility];  Service: Gynecology;  Laterality: N/A;   BREAST CAPSULECTOMY WITH IMPLANT EXCHANGE Bilateral 04/10/2023   Procedure: BREAST CAPSULORRHAPHY WITH SILICONE IMPLANT EXCHANGE;  Surgeon: Arelia Filippo, MD;  Location: [facility];  Service: Plastics;  Laterality: Bilateral;   BREAST RECONSTRUCTION WITH PLACEMENT OF TISSUE EXPANDER AND ALLODERM Bilateral 10/19/2020   Procedure: BILATERAL BREAST RECONSTRUCTION WITH PLACEMENT OF TISSUE EXPANDER AND ALLODERM;  Surgeon: Arelia Filippo, MD;  Location: [facility];  Service: Plastics;  Laterality: Bilateral;   BUNIONECTOMY  1995   Right   CHOLECYSTECTOMY  late 1980's       COLONOSCOPY WITH PROPOFOL  N/A 09/22/2023   Procedure: COLONOSCOPY WITH PROPOFOL ;  Surgeon: San Sandor [name],  DO;  [facility]: [facility];  Service: Gastroenterology;  Laterality: N/A;   CYSTOSCOPY  03/01/2012   Procedure: CYSTOSCOPY;  Surgeon: Harland JAYSON Birkenhead, MD;  Location: [facility];  Service: Gynecology;  Laterality: N/A;   CYSTOSCOPY W/ URETERAL STENT PLACEMENT Left 12/15/2014   Procedure: CYSTOSCOPY, LEFT URETEROSCOPYU WITH STONE EXTRACTION;  Surgeon: Norleen Seltzer, MD;  Location: [facility];  Service: Urology;  Laterality: Left;   CYSTOSCOPY W/ URETERAL STENT PLACEMENT Left 10/02/2016   Procedure: CYSTOSCOPY WITH RETROGRADE PYELOGRAM/URETERAL STENT  PLACEMENT ureteroscopy, basket extraction;  Surgeon: Belvie LITTIE Clara, MD;  Location: [facility];  Service: Urology;  Laterality: Left;   ESOPHAGOGASTRODUODENOSCOPY (EGD) WITH PROPOFOL  N/A 09/22/2023   Procedure: ESOPHAGOGASTRODUODENOSCOPY (EGD) WITH PROPOFOL ;  Surgeon: San Sandor [name], DO;  [facility];  Service: Gastroenterology;  Laterality: N/A;   HARDWARE REMOVAL N/A 12/22/2016   Procedure: Lumbar Four-Five Removal of Hardware;  Surgeon: Victory Gunnels, MD;  Location: [facility] OR;  Service: Neurosurgery;  Laterality: N/A;   HEMOSTASIS CLIP PLACEMENT  09/22/2023   Procedure: HEMOSTASIS CLIP PLACEMENT;  Surgeon: San Sandor [name], DO;  [facility];  Service: Gastroenterology;;   INCISION AND DRAINAGE ABSCESS Left 07/19/2013   Procedure: INCISION AND DRAINAGE LEFT LOWER EXTERMITY HEMATOMA;  Surgeon: Donnice POUR. Belinda, MD;  Location: [facility];  Service: General;  Laterality: Left;  Excision left lower leg mass   KNEE CARTILAGE SURGERY     left   KNEE SURGERY Left    x 2, Murphy/Sue, corrected Patella displacement    LAMINECTOMY     LAPAROSCOPIC APPENDECTOMY N/A 08/05/2017   Procedure: APPENDECTOMY LAPAROSCOPIC;  Surgeon: Stevie Herlene Righter, MD;  [facility];  Service: General;  Laterality: N/A;   LIPOSUCTION Bilateral 04/10/2023   Procedure: BILATERAL CHEST WALLS LIPOSUCTION;  Surgeon: Arelia Filippo, MD;  Location: [facility];  Service: Plastics;  Laterality: Bilateral;   LUMBAR LAMINECTOMY  06/2005   L4-5, Ray   LUMBAR PERCUTANEOUS PEDICLE SCREW 1 LEVEL Left 08/17/2018   Procedure: LUMBAR THREE-FOUR LUMBAR PERCUTANEOUS PEDICLE SCREW;  Surgeon: Gunnels Victory, MD;  [facility]: [facility];  Service: Neurosurgery;  Laterality: Left;   MASTECTOMY W/ SENTINEL NODE BIOPSY Bilateral 10/19/2020   Procedure: BILATERAL MASTECTOMY WITH LEFT SENTINEL LYMPH NODE BIOPSY;  Surgeon: Curvin Deward MOULD, MD;  Location: [facility];  Service: General;   Laterality: Bilateral;   POLYPECTOMY  09/22/2023   Procedure: POLYPECTOMY;  Surgeon: San Sandor [name], DO;  [facility];  Service: Gastroenterology;;   POSTERIOR CERVICAL LAMINECTOMY Right 02/28/2020   Procedure: Right Cervical Six-Seven Laminectomy and Foraminotomy;  Surgeon: Gunnels Victory, MD;  Location: [facility] OR;  Service: Neurosurgery;  Laterality: Right;  3C   R compressed pronator Right    Sypher, R arm   REMOVAL OF BILATERAL TISSUE EXPANDERS WITH PLACEMENT OF BILATERAL BREAST IMPLANTS Bilateral 01/28/2021   Procedure: REMOVAL OF BILATERAL TISSUE EXPANDERS WITH PLACEMENT OF BILATERAL BREAST IMPLANTS WITH ACELLULAR DERMIS;  Surgeon: Arelia Filippo, MD;  Location: [facility];  Service: Plastics;  Laterality: Bilateral;   RESECTION DISTAL CLAVICAL Right 06/30/2017   Procedure: RIGHT RESECTION DISTAL CLAVICAL;  Surgeon: Sheril Coy, MD;  [facility]: [facility];  Service: Orthopedics;  Laterality: Right;   SHOULDER ACROMIOPLASTY Right 06/30/2017   Procedure: RIGHT SHOULDER ACROMIOPLASTY;  Surgeon: Sheril Coy, MD;  Location: [facility];  Service: Orthopedics;  Laterality: Right;   SHOULDER ARTHROSCOPY Right 06/30/2017   Procedure: ARTHROSCOPY DEBRIDEMENT RIGHT SHOULDER;  Surgeon: Sheril Coy, MD;  Location: [facility] OR;  Service:  Orthopedics;  Laterality: Right;   SPINAL FUSION     TENDON REPAIR  2013   TENDON REPAIR Right 02/01/2012   TONSILLECTOMY  age 89   TOTAL KNEE ARTHROPLASTY Left 08/12/2022   Procedure: LEFT TOTAL KNEE ARTHROPLASTY;  Surgeon: Sheril Coy, MD;  [facility]: [facility];  Service: Orthopedics;  Laterality: Left;   TOTAL SHOULDER ARTHROPLASTY Right 08/22/2021   Procedure: TOTAL SHOULDER ARTHROPLASTY;  Surgeon: Dozier Soulier, MD;  Location: [facility];  Service: Orthopedics;  Laterality: Right;    Family History  Problem Relation Age of Onset   Cancer Mother        pancreatic cancer   Heart disease Father 44   Colon polyps Father    Leukemia Father     Dementia Father    Breast cancer Maternal Grandmother    Ovarian cancer Maternal Grandmother 46   Colon cancer Paternal Grandfather        dx < 50   Uterine cancer Other        Grandmother   Breast cancer Other        Grandmother   Ovarian cancer Other        Grandmother   Diabetes Other        mother   Other Neg Hx    Social History:  reports that she has never smoked. She has never used smokeless tobacco. She reports that she does not currently use alcohol. She reports that she does not use drugs.  Allergies:  Allergies  Allergen Reactions   Chlorquinaldol Rash    11/30/19 patch testing: positive reaction to Quinoline Mix (contains clioquinol and chlorquinaldol)   Clioquinol Rash    11/30/19 patch testing: positive reaction to Quinoline Mix (contains clioquinol and chlorquinaldol)    Other Anaphylaxis    Pt has anaphylactic reaction to something during procedure - unsure of what   Per patient, Quinoline Mix  - based on allergy  testing   Latex Itching, Swelling and Dermatitis    SWELLING FROM FACE MASK RING AFTER LAST SURGERY    Lisinopril Cough        Codeine Nausea And Vomiting   Hydrocodone  Itching   Tape Itching and Rash    TOLERATES ADHESIVE AND PAPER TAPE ONLY -- NO PINK SURGICAL TAPE    Wound Dressing Adhesive Rash    Dermabond    No medications prior to admission.    No results found for this or any previous visit (from the past 48 hours). No results found.  Pertinent items noted in HPI and remainder of comprehensive ROS otherwise negative.  Last menstrual period 12/07/2011.  Patient is awake and alert.  She is oriented and appropriate.  Speech is fluent.  Judgment insight are intact.  Cranial nerve function normal bilateral.  Motor examination reveals 5/5 strength in both upper and lower extremities.  Sensory examination with some patchy distal sensory loss in both upper extremities.  Reflexes are normal active.  No evidence of long track signs.   Examination head ears eyes nose and throat is unremarked.  Chest and abdomen are benign.  Extremities are free from injury deformity. 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  Plan C3-4, C4-5 anterior cervical discectomy and fusion utilizing interbody allograft wedge, anterior plate instrumentation.  This will require removal prior anterior sedation from C5-C7.  Risks and benefits been explained.  Patient wishes to proceed.  Racer Quam A Adina Puzzo 04/12/2024, 7:50 AM

## 2024-04-12 NOTE — Anesthesia Postprocedure Evaluation (Signed)
 Anesthesia Post Note  Patient: Melissa Allen  Procedure(s) Performed: ANTERIOR CERVICAL DECOMPRESSION/DISCECTOMY FUSION 2 LEVELS     Patient location during evaluation: PACU Anesthesia Type: General Level of consciousness: awake and alert Pain management: pain level controlled Vital Signs Assessment: post-procedure vital signs reviewed and stable Respiratory status: spontaneous breathing, nonlabored ventilation, respiratory function stable and patient connected to nasal cannula oxygen  Cardiovascular status: blood pressure returned to baseline and stable Postop Assessment: no apparent nausea or vomiting Anesthetic complications: no   No notable events documented.  Last Vitals:  Vitals:   04/12/24 1530 04/12/24 1555  BP: (!) 140/95 (!) 150/93  Pulse: 95 93  Resp: 13 18  Temp: 37.1 C 36.7 C  SpO2: 94% 97%    Last Pain:  Vitals:   04/12/24 1538  TempSrc:   PainSc: 4                  Garnette DELENA Gab

## 2024-04-12 NOTE — Anesthesia Procedure Notes (Signed)
 Procedure Name: Intubation Date/Time: 04/12/2024 12:09 PM  Performed by: Omeka Holben C, CRNAPre-anesthesia Checklist: Patient identified, Emergency Drugs available, Suction available and Patient being monitored Patient Re-evaluated:Patient Re-evaluated prior to induction Oxygen  Delivery Method: Circle system utilized Preoxygenation: Pre-oxygenation with 100% oxygen  Induction Type: IV induction Ventilation: Mask ventilation without difficulty Laryngoscope Size: Glidescope and 3 Grade View: Grade I Tube type: Oral Tube size: 7.0 mm Number of attempts: 1 Airway Equipment and Method: Oral airway, Rigid stylet and Video-laryngoscopy Placement Confirmation: ETT inserted through vocal cords under direct vision, positive ETCO2 and breath sounds checked- equal and bilateral Secured at: 21 cm Tube secured with: Tape Dental Injury: Teeth and Oropharynx as per pre-operative assessment

## 2024-04-12 NOTE — Op Note (Signed)
 Date of procedure: 04/12/2024  Date of dictation: Same  Service: Neurosurgery  Preoperative diagnosis: C3-4, C4-5 spondylosis with stenosis and myelopathy and radiculopathy, status post C5-C7 anterior cervical fusion with instrumentation  Postoperative diagnosis: Same  Procedure Name: C3-4, C4-5 anterior cervical discectomy with interbody allograft wedge and anterior plate instrumentation  Removal of C5-C7 plate  Surgeon:Phoenix Dresser A.Jaloni Sorber, M.D.  Asst. Surgeon: Jennetta, NP  Anesthesia: General  Indication: 58 year old female with history of prior C5-C7 anterior cervical discectomy and fusion with good results presents with worsening neck and upper extremity symptoms.  Workup demonstrates evidence of marked spondylosis with stenosis and instability at C3-4 and C4-5.  Patient presents now for two-level anterior cervical decompression and fusion.  This will require removal of the previously placed C5-C7 instrumentation.  Operative note: After induction of anesthesia, patient position supine with neck slightly extended and held placeholder traction.  Patient's anterior cervical region prepped draped sterilely.  Incision made overlying C4-5.  Dissection performed on the right.  Retractor placed.  Fluoroscopy used.  Levels confirmed.  Previously placed anterior plate instrumentation from C5-C7 was dissected free.  This was disassembled and the plate was removed.  Fusions at C5-6 and C6-7 were inspected and found to be solid.  Anterior osteophytes removed at C4-5.  Disc bases at both C4-5 and C3-4 were incised using a 15 blade.  Discectomy was then performed using various instruments to remove elements of the disc out of the posterior annulus.  Microscope then brought to the field used throughout the remainder of the discectomy.  Remaining aspects of annulus and osteophytes removed using high-speed drill down to level the posterior logical limb.  Posterior logical limb was then elevated and resected in a  piecemeal fashion.  Underlying thecal sac was identified.  A wide central decompression was then performed by undercutting the bodies of C3 and C4.  Decompression then proceeded each neural foramina.  Wide anterior foraminotomies were performed along course the exiting C4 nerve roots bilaterally.  At this point a very thorough decompression been achieved.  There was no evidence of injury to the thecal sac or nerve roots.  Attention then placed to the C4-5 level where the discectomy was performed in similar fashion again without complications.  Wound was irrigated.  Gelfoam was placed topically for hemostasis then removed.  6 mm Medtronic allograft wedges were then impacted into place and recessed slightly from the anterior cortical margin at both C3-4 and C4-5.  Atlantis anterior cervical plate was then placed of the C3, C4 and C5 levels.  This then attached under fluoroscopic guidance using 13 mm variable angle screws to each at all 3 levels.  All screws given final tightening found to be solidly within the bone.  Locking screws engaged in all 3 levels.  Final images reveal good position of the cages and the hardware at the proper operative level with normal alignment of the spine.  Wound was irrigated.  Hemostasis was achieved with bipolar cautery.  Wounds then closed in layers with Vicryl sutures.  Steri-Strips and sterile dressing were applied.  No apparent complications.  Patient tolerated the procedure well and she returns to the recovery room postop.

## 2024-04-13 ENCOUNTER — Encounter (HOSPITAL_COMMUNITY): Payer: Self-pay | Admitting: Neurosurgery

## 2024-04-13 DIAGNOSIS — M4712 Other spondylosis with myelopathy, cervical region: Secondary | ICD-10-CM | POA: Diagnosis not present

## 2024-04-13 LAB — GLUCOSE, CAPILLARY: Glucose-Capillary: 104 mg/dL — ABNORMAL HIGH (ref 70–99)

## 2024-04-13 NOTE — Evaluation (Signed)
 Occupational Therapy Evaluation and Discharge Patient Details Name: Melissa Allen MRN: [id number] DOB: Sep 07, 1966 Today's Date: 04/13/2024   History of Present Illness   Pt is a 58 year old woman admitted on 04/12/24 for C3-4, C 4-5 ACDF. Pt has had multiple spine surgeries per her report. PMH: arthritis, asthma, HTN, seizures, anxiety, depression, DM, arthritis, fibromyalgia, breast cancer.     Clinical Impressions All education completed. Pt is functioning modified independently in basic ADLs and reports having her husband available to assist with lifting and IADLs. No further OT needs.      If plan is discharge home, recommend the following:   Assistance with cooking/housework;Assist for transportation;Help with stairs or ramp for entrance     Functional Status Assessment   Patient has had a recent decline in their functional status and demonstrates the ability to make significant improvements in function in a reasonable and predictable amount of time.     Equipment Recommendations   None recommended by OT     Recommendations for Other Services         Precautions/Restrictions   Precautions Precautions: Cervical Precaution Booklet Issued: Yes (comment) Recall of Precautions/Restrictions: Intact Precaution/Restrictions Comments: reeducated in cervical precautions, pt is familiar from previous surgeries Required Braces or Orthoses: Cervical Brace Cervical Brace: Soft collar Restrictions Weight Bearing Restrictions Per Provider Order: No     Mobility Bed Mobility Overal bed mobility: Modified Independent             General bed mobility comments: used rail and log roll technique    Transfers Overall transfer level: Modified independent                 General transfer comment: tentative      Balance Overall balance assessment: No apparent balance deficits (not formally assessed)                                          ADL either performed or assessed with clinical judgement   ADL Overall ADL's : Modified independent                                       General ADL Comments: Educated in IADLs to avoid and compensatory strategies for ADLs adhering to cervical precautions. Pt verbalized and or demonstrated understanding.     Vision Baseline Vision/History: 1 Wears glasses Ability to See in Adequate Light: 0 Adequate Patient Visual Report: No change from baseline       Perception         Praxis         Pertinent Vitals/Pain Pain Assessment Pain Assessment: Faces Faces Pain Scale: Hurts a little bit Pain Location: neck Pain Descriptors / Indicators: Operative site guarding, Discomfort Pain Intervention(s): Patient requesting pain meds-RN notified     Extremity/Trunk Assessment Upper Extremity Assessment Upper Extremity Assessment: Overall WFL for tasks assessed   Lower Extremity Assessment Lower Extremity Assessment: Overall WFL for tasks assessed   Cervical / Trunk Assessment Cervical / Trunk Assessment: Neck Surgery   Communication Communication Communication: No apparent difficulties   Cognition Arousal: Alert Behavior During Therapy: WFL for tasks assessed/performed Cognition: No apparent impairments  Following commands: Intact       Cueing  General Comments   Cueing Techniques: Verbal cues      Exercises     Shoulder Instructions      Home Living Family/patient expects to be discharged to:: Private residence Living Arrangements: Spouse/significant other Available Help at Discharge: Family;Available 24 hours/day Type of Home: House Home Access: Stairs to enter [employer] of Steps: 1   Home Layout: Two level;Able to live on main level with bedroom/bathroom     Bathroom Shower/Tub: Arts development officer Toilet: Handicapped height     Home Equipment: Agricultural consultant (2  wheels);Cane - single point;BSC/3in1;Grab bars - tub/shower;Grab bars - toilet          Prior Functioning/Environment Prior Level of Function : Independent/Modified Independent;Driving                    OT Problem List:     OT Treatment/Interventions:        OT Goals(Current goals can be found in the care plan section)       OT Frequency:       Co-evaluation              AM-PAC OT 6 Clicks Daily Activity     Outcome Measure Help from another person eating meals?: None Help from another person taking care of personal grooming?: None Help from another person toileting, which includes using toliet, bedpan, or urinal?: None Help from another person bathing (including washing, rinsing, drying)?: None Help from another person to put on and taking off regular upper body clothing?: None Help from another person to put on and taking off regular lower body clothing?: None 6 Click Score: 24   End of Session Equipment Utilized During Treatment: Cervical collar Nurse Communication: Patient requests pain meds;Other (comment) (ready for d/c)  Activity Tolerance: Patient tolerated treatment well Patient left: in bed;with call bell/phone within reach  OT Visit Diagnosis: Pain                Time: 9054-8995 OT Time Calculation (min): 19 min Charges:  OT General Charges $OT Visit: 1 Visit OT Evaluation $OT Eval Low Complexity: 1 Low  Melissa Allen, OTR/L Acute Rehabilitation Services Office: [phone]   Melissa Allen 04/13/2024, 10:34 AM

## 2024-04-13 NOTE — Plan of Care (Signed)

## 2024-04-13 NOTE — Discharge Instructions (Addendum)
  Wound Care Keep incision covered and dry until post op day 3. You may remove the Honeycomb dressing on post op day 3. Leave steri-strips on neck.  They will fall off by themselves. Do not put any creams, lotions, or ointments on incision. You are fine to shower. Let water  run over incision and pat dry.  Activity Walk each and every day, increasing distance each day. No lifting greater than 8 lbs.  Avoid excessive neck motion. No driving,  Diet Resume your normal diet.   Return to Work Will be discussed at your follow up appointment.  Call Your Doctor If Any of These Occur Redness, drainage, or swelling at the wound.  Temperature greater than 101 degrees. Severe pain not relieved by pain medication. Incision starts to come apart.  Follow Up Appt Call [phone] if you have one or any problem.

## 2024-04-13 NOTE — Progress Notes (Signed)
 Patient alert and oriented, voiding adequately, skin clean, dry and intact without evidence of skin break down, or symptoms of complications - no redness or edema noted, only slight tenderness at site.  Patient states pain is manageable at time of discharge. Room was checked and accounted for all patient's belongings; discharge instructions concerning her medications, incision care, follow up appointment and when to call the doctor as needed were all discussed with patient by RN and she expressed understanding on the instructions given.

## 2024-04-13 NOTE — Discharge Summary (Signed)
 Physician Discharge Summary  Patient ID: Melissa Allen MRN: [id number] DOB/AGE: 1966/08/27 58 y.o.  Admit date: 04/12/2024 Discharge date: 04/13/2024  Admission Diagnoses:  Discharge Diagnoses:  Principal Problem:   Cervical spondylosis with myelopathy and radiculopathy   Discharged Condition: good  Hospital Course: Patient admitted to hospital where she underwent uncomplicated to avoid decompression and fusion.  Postoperatively doing well.  Preoperative neck and upper extremity symptoms much improved.  Standing ambulating and voiding without difficulty.  Voice strong.  Swallowing well.  Consults:   Significant Diagnostic Studies:   Treatments:   Discharge Exam: Blood pressure 107/68, pulse 82, temperature 98.7 F (37.1 C), temperature source Oral, resp. rate 17, height 5' 3 (1.6 m), weight 71.2 kg, last menstrual period 12/07/2011, SpO2 98%. Awake and alert.  Oriented and appropriate.  Motor and sensory function intact.  Wound clean and dry.  Neck soft.  Chest and abdomen benign.  Disposition: Discharge disposition: 01-Home or Self Care        Allergies as of 04/13/2024       Reactions   Chlorquinaldol Rash   11/30/19 patch testing: positive reaction to Quinoline Mix (contains clioquinol and chlorquinaldol)   Clioquinol Rash   11/30/19 patch testing: positive reaction to Quinoline Mix (contains clioquinol and chlorquinaldol)   Other Anaphylaxis   Pt has anaphylactic reaction to something during procedure - unsure of what  Per patient, Quinoline Mix  - based on allergy  testing   Latex Itching, Swelling, Dermatitis   SWELLING FROM FACE MASK RING AFTER LAST SURGERY   Lisinopril Cough      Codeine Nausea And Vomiting   Hydrocodone  Itching   Tape Itching, Rash   TOLERATES ADHESIVE AND PAPER TAPE ONLY -- NO PINK SURGICAL TAPE    Wound Dressing Adhesive Rash   Dermabond        Medication List     TAKE these medications    albuterol  108 (90 Base) MCG/ACT  inhaler Commonly known as: ProAir  HFA INHALE TWO PUFFS EVERY FOUR HOURS AS NEEDED SHORTNESS OF BREATH What changed:  how much to take how to take this when to take this reasons to take this additional instructions   Auvelity  45-105 MG Tbcr Generic drug: Dextromethorphan -buPROPion  ER Take 1 tablet by mouth in the morning and at bedtime.   doxepin  25 MG capsule Commonly known as: SINEQUAN  Take 75 mg by mouth at bedtime.   DULoxetine  60 MG capsule Commonly known as: CYMBALTA  Take 120 mg by mouth daily.   EPINEPHrine  0.3 mg/0.3 mL Soaj injection Commonly known as: EPI-PEN Inject 0.3 mLs (0.3 mg total) into the muscle as needed for anaphylaxis.   estradiol  1 MG tablet Commonly known as: ESTRACE  TAKE ONE TABLET BY MOUTH ONCE A DAY   hydrochlorothiazide  25 MG tablet Commonly known as: HYDRODIURIL  Take 25 mg by mouth daily.   losartan  50 MG tablet Commonly known as: COZAAR  Take 50 mg by mouth in the morning and at bedtime.   MAGNESIUM  PO Take 1 capsule by mouth daily at 12 noon.   metFORMIN  500 MG 24 hr tablet Commonly known as: GLUCOPHAGE -XR Take 500 mg by mouth 2 (two) times daily.   multivitamin with minerals Tabs tablet Take 1 tablet by mouth daily.   omeprazole  40 MG capsule Commonly known as: PRILOSEC Take 1 capsule (40 mg total) by mouth 2 (two) times daily for 28 days. What changed:  when to take this reasons to take this   Percocet 10-325 MG tablet Generic drug: oxyCODONE -acetaminophen  Take 1 tablet  by mouth every 4 (four) hours as needed.   QUEtiapine  400 MG tablet Commonly known as: SEROQUEL  Take 400 mg by mouth at bedtime.   tiZANidine  4 MG tablet Commonly known as: Zanaflex  Take 1 and 1/2 tablets by mouth 3 times a day as needed What changed:  how much to take how to take this when to take this reasons to take this   triamterene -hydrochlorothiazide  37.5-25 MG tablet Commonly known as: MAXZIDE -25 Take 1 tablet by mouth daily.   zolpidem   12.5 MG CR tablet Commonly known as: AMBIEN  CR Take 12.5 mg by mouth at bedtime as needed.         Signed: [facility] LABOR Season Astacio 04/13/2024, 8:16 AM

## 2024-04-14 ENCOUNTER — Other Ambulatory Visit: Payer: Self-pay

## 2024-04-15 ENCOUNTER — Encounter: Admitting: Neurology

## 2024-05-18 ENCOUNTER — Other Ambulatory Visit: Payer: Self-pay

## 2024-05-18 ENCOUNTER — Emergency Department (HOSPITAL_COMMUNITY)

## 2024-05-18 ENCOUNTER — Encounter (HOSPITAL_COMMUNITY): Payer: Self-pay | Admitting: Emergency Medicine

## 2024-05-18 ENCOUNTER — Emergency Department (HOSPITAL_COMMUNITY)
Admission: EM | Admit: 2024-05-18 | Discharge: 2024-05-18 | Disposition: A | Discharge status: Home / Self Care | Payer: Self-pay | Attending: Emergency Medicine | Admitting: Emergency Medicine

## 2024-05-18 DIAGNOSIS — S161XXA Strain of muscle, fascia and tendon at neck level, initial encounter: Secondary | ICD-10-CM | POA: Insufficient documentation

## 2024-05-18 DIAGNOSIS — S199XXA Unspecified injury of neck, initial encounter: Secondary | ICD-10-CM | POA: Diagnosis present

## 2024-05-18 DIAGNOSIS — M546 Pain in thoracic spine: Secondary | ICD-10-CM | POA: Diagnosis not present

## 2024-05-18 DIAGNOSIS — Y92410 Unspecified street and highway as the place of occurrence of the external cause: Secondary | ICD-10-CM | POA: Insufficient documentation

## 2024-05-18 DIAGNOSIS — Z91040 Latex allergy status: Secondary | ICD-10-CM | POA: Insufficient documentation
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
## 2024-05-18 NOTE — ED Notes (Signed)
 Patient d/c with home care instructions.
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
## 2024-08-15 ENCOUNTER — Telehealth: Payer: Self-pay | Admitting: Family Medicine

## 2024-08-15 DIAGNOSIS — E2839 Other primary ovarian failure: Secondary | ICD-10-CM

## 2024-08-15 MED ORDER — ESTRADIOL 1 MG PO TABS: 1.0000 mg | ORAL_TABLET | Freq: Every day | ORAL | 0 refills | Status: DC

## 2024-08-15 NOTE — Telephone Encounter (Signed)
 Patient called in needing a refill on Rx Estradiol  1mg  tablet . [facility]. Has an appointment scheduled Sep 22, 2024 at 10:55am with Dr Fredirick.

## 2024-09-03 ENCOUNTER — Encounter (HOSPITAL_BASED_OUTPATIENT_CLINIC_OR_DEPARTMENT_OTHER): Payer: Self-pay | Admitting: Emergency Medicine

## 2024-09-03 ENCOUNTER — Other Ambulatory Visit: Payer: Self-pay

## 2024-09-03 ENCOUNTER — Emergency Department (HOSPITAL_BASED_OUTPATIENT_CLINIC_OR_DEPARTMENT_OTHER)
Admission: EM | Admit: 2024-09-03 | Discharge: 2024-09-03 | Disposition: A | Discharge status: Home / Self Care | Attending: Emergency Medicine | Admitting: Emergency Medicine

## 2024-09-03 DIAGNOSIS — I10 Essential (primary) hypertension: Secondary | ICD-10-CM | POA: Insufficient documentation

## 2024-09-03 DIAGNOSIS — Z7984 Long term (current) use of oral hypoglycemic drugs: Secondary | ICD-10-CM | POA: Diagnosis not present

## 2024-09-03 DIAGNOSIS — Z79899 Other long term (current) drug therapy: Secondary | ICD-10-CM | POA: Insufficient documentation

## 2024-09-03 DIAGNOSIS — Z91040 Latex allergy status: Secondary | ICD-10-CM | POA: Insufficient documentation

## 2024-09-03 DIAGNOSIS — R059 Cough, unspecified: Secondary | ICD-10-CM | POA: Diagnosis present

## 2024-09-03 DIAGNOSIS — R000 Tachycardia, unspecified: Secondary | ICD-10-CM | POA: Diagnosis not present

## 2024-09-03 DIAGNOSIS — E119 Type 2 diabetes mellitus without complications: Secondary | ICD-10-CM | POA: Diagnosis not present

## 2024-09-03 DIAGNOSIS — J101 Influenza due to other identified influenza virus with other respiratory manifestations: Secondary | ICD-10-CM | POA: Diagnosis not present

## 2024-09-03 LAB — RESP PANEL BY RT-PCR (RSV, FLU A&B, COVID)  RVPGX2
Influenza A by PCR: POSITIVE — AB
Influenza B by PCR: NEGATIVE
Resp Syncytial Virus by PCR: NEGATIVE
SARS Coronavirus 2 by RT PCR: NEGATIVE

## 2024-09-03 MED ORDER — IBUPROFEN 400 MG PO TABS
600.0000 mg | ORAL_TABLET | Freq: Once | ORAL | Status: AC
  Administered 2024-09-03: 600 mg via ORAL
  Filled 2024-09-03: qty 1

## 2024-09-03 MED ORDER — ONDANSETRON 4 MG PO TBDP
4.0000 mg | ORAL_TABLET | Freq: Once | ORAL | Status: AC
  Administered 2024-09-03: 4 mg via ORAL
  Filled 2024-09-03: qty 1

## 2024-09-03 MED ORDER — ACETAMINOPHEN 325 MG PO TABS
650.0000 mg | ORAL_TABLET | Freq: Once | ORAL | Status: AC | PRN
  Administered 2024-09-03: 650 mg via ORAL
  Filled 2024-09-03: qty 2

## 2024-09-03 NOTE — ED Provider Notes (Signed)
 " [place] EMERGENCY DEPARTMENT AT [facility] Provider Note   CSN: [id number] Arrival date & time: 09/03/24  9062     Patient presents with: Melissa Allen Symptoms   MYSTIC LABO is a 58 y.o. female with history of type 2 diabetes, hypertension, presents with concern for bodyaches, dry cough, congestion, and fever and chills at home.  She reports her husband tested positive for the flu 3 days ago which is when her symptoms also started.  She also reports a mild sore throat, but no difficulties with swallowing.  Denies any vomiting, abdominal pain, or diarrhea.   HPI     Prior to Admission medications  Medication Sig Start Date End Date Taking? Authorizing Provider  albuterol  (PROAIR  HFA) 108 (90 Base) MCG/ACT inhaler INHALE TWO PUFFS EVERY FOUR HOURS AS NEEDED SHORTNESS OF BREATH Patient taking differently: Inhale 2 puffs into the lungs every 6 (six) hours as needed for shortness of breath or wheezing. 08/27/16   Norleen Lynwood ORN, MD  Dextromethorphan -buPROPion  ER (AUVELITY ) 45-105 MG TBCR Take 1 tablet by mouth in the morning and at bedtime.    [provider]  doxepin  (SINEQUAN ) 25 MG capsule Take 75 mg by mouth at bedtime. 07/06/23   [provider]  DULoxetine  (CYMBALTA ) 60 MG capsule Take 120 mg by mouth daily. 05/22/23   [provider]  EPINEPHrine  0.3 mg/0.3 mL IJ SOAJ injection Inject 0.3 mLs (0.3 mg total) into the muscle as needed for anaphylaxis. 10/04/19   Alvia Bring, DO  estradiol  (ESTRACE ) 1 MG tablet Take 1 tablet (1 mg total) by mouth daily. 08/15/24   Fredirick Glenys GORMAN, MD  hydrochlorothiazide  (HYDRODIURIL ) 25 MG tablet Take 25 mg by mouth daily.    [provider]  losartan  (COZAAR ) 50 MG tablet Take 50 mg by mouth in the morning and at bedtime.    [provider]  MAGNESIUM  PO Take 1 capsule by mouth daily at 12 noon.    [provider]  metFORMIN  (GLUCOPHAGE -XR) 500 MG 24 hr tablet Take 500 mg by mouth 2 (two)  times daily.    [provider]  Multiple Vitamin (MULTIVITAMIN WITH MINERALS) TABS tablet Take 1 tablet by mouth daily.    [provider]  omeprazole  (PRILOSEC) 40 MG capsule Take 1 capsule (40 mg total) by mouth 2 (two) times daily for 28 days. Patient taking differently: Take 40 mg by mouth daily as needed (indigestion). 07/21/23 04/06/24  Pahwani, Ravi, MD  PERCOCET 10-325 MG tablet Take 1 tablet by mouth every 4 (four) hours as needed. 03/09/24   [provider]  QUEtiapine  (SEROQUEL ) 400 MG tablet Take 400 mg by mouth at bedtime.    [provider]  tiZANidine  (ZANAFLEX ) 4 MG tablet Take 1 and 1/2 tablets by mouth 3 times a day as needed Patient taking differently: Take 6 mg by mouth 3 (three) times daily as needed for muscle spasms. 11/06/22     triamterene -hydrochlorothiazide  (MAXZIDE -25) 37.5-25 MG tablet Take 1 tablet by mouth daily. 03/18/24   [provider]  zolpidem  (AMBIEN  CR) 12.5 MG CR tablet Take 12.5 mg by mouth at bedtime as needed.    [provider]    Allergies: Chlorquinaldol, Clioquinol, Other, Latex, Lisinopril, Codeine, Hydrocodone , Tape, and Wound dressing adhesive    Review of Systems  Constitutional:  Positive for chills and fever.    Updated Vital Signs BP 120/84 (BP Location: Right Arm)   Pulse (!) 111   Temp 99.5 F (37.5 C) (Oral)  Resp 18   Ht 5' 3 (1.6 m)   Wt 66.7 kg   LMP 12/07/2011   SpO2 99%   BMI 26.04 kg/m   Physical Exam Vitals and nursing note reviewed.  Constitutional:      General: She is not in acute distress.    Appearance: She is well-developed. She is ill-appearing. She is not toxic-appearing.  HENT:     Head: Normocephalic and atraumatic.     Right Ear: Tympanic membrane and ear canal normal.     Left Ear: Tympanic membrane and ear canal normal.  Eyes:     Conjunctiva/sclera: Conjunctivae normal.  Cardiovascular:     Rate and Rhythm: Regular rhythm. Tachycardia present.      Heart sounds: No murmur heard. Pulmonary:     Effort: Pulmonary effort is normal. No respiratory distress.     Breath sounds: Normal breath sounds.  Abdominal:     Palpations: Abdomen is soft.     Tenderness: There is no abdominal tenderness.  Musculoskeletal:        General: No swelling.     Cervical back: Normal range of motion and neck supple. No rigidity.  Lymphadenopathy:     Cervical: No cervical adenopathy.  Skin:    General: Skin is warm and dry.     Capillary Refill: Capillary refill takes less than 2 seconds.     Findings: No rash.  Neurological:     Mental Status: She is alert.  Psychiatric:        Mood and Affect: Mood normal.     (all labs ordered are listed, but only abnormal results are displayed) Labs Reviewed  RESP PANEL BY RT-PCR (RSV, FLU A&B, COVID)  RVPGX2 - Abnormal; Notable for the following components:      Result Value   Influenza A by PCR POSITIVE (*)    All other components within normal limits    EKG: None  Radiology: No results found.   Procedures   Medications Ordered in the ED  acetaminophen  (TYLENOL ) tablet 650 mg (650 mg Oral Given 09/03/24 1026)  ondansetron  (ZOFRAN -ODT) disintegrating tablet 4 mg (4 mg Oral Given 09/03/24 1422)  ibuprofen  (ADVIL ) tablet 600 mg (600 mg Oral Given 09/03/24 1422)                                    Medical Decision Making Risk OTC drugs. Prescription drug management.     Differential diagnosis includes but is not limited to COVID, flu, RSV, viral URI, strep pharyngitis, viral pharyngitis, allergic rhinitis, pneumonia, bronchitis   ED Course:  Upon initial evaluation, patient is ill-appearing, but no acute distress.  She has a fever to 100.7 upon arrival.  Tachycardic to 137.  Lungs clear to auscultation.  No signs of otitis media or otitis externa on exam.  No significant erythema of the posterior oropharynx, no tonsillar exudate, lower concern for strep pharyngitis given symptoms and  Centor score 0.  She is reporting some nausea, but no vomiting.  Will give Zofran .  Will give Tylenol  for headache and fever.  Labs Ordered: I Ordered, and personally interpreted labs.  The pertinent results include:   Influenza positive. Covid and RSV negative   Medications Given: Tylenol  Ibuprofen  Zofran   Upon re-evaluation, patient remains well appearing.  Fever has broken with the Tylenol  given, temperature now 99.5.  Tachycardia also improved to 111.  Patient tested positive for Influenza A which would  explain patient's symptoms and vital sign abnormalities. Covid and RSV negative.  Lower concern for pneumonia at this time given lungs clear and positive influenza test would explain patient's symptoms.  Patient without significant co-morbidities, no significant vital sign abnormalities, tolerating PO intake, do not feel patient needs any admission for treatment or observation.  Considered Tamiflu, but patient on day 3 of symptoms, and also reporting nausea.  Feel patient would not gain much benefit from this medication.  Patient stable and appropriate for discharge home at this time.   Impression: Influenza A  Disposition:  Discharged home with instructions to use over-the-counter medications as needed for symptom control.  Follow-up with PCP if symptoms not improving within the next 5 days. Remain out of school/work until symptoms improving and fever free for at least 24 hours without the use of fever reducing medication.  Work/school note provided. Return precautions given and patient verbalized understanding.    This chart was dictated using voice recognition software, Dragon. Despite the best efforts of this provider to proofread and correct errors, errors may still occur which can change documentation meaning.       Final diagnoses:  Influenza A    ED Discharge Orders     None          Veta Palma, [place] 09/03/24 1446  "

## 2024-09-03 NOTE — Discharge Instructions (Addendum)
 You tested positive for the flu today.  It takes about 7-10 days for symptoms to start improving. Your COVID, RSV are negative.  Your illness is contagious and can be spread to others. It cannot be cured by antibiotics or other medicines. Take basic precautions such as washing your hands often, covering your mouth when you cough or sneeze, and avoiding public places where you could spread your illness to others.  Home care instructions:  You can take Tylenol  (acetominophen) and/or Motrin  (Ibuprofen ) as directed on the packaging for fever reduction and pain relief.  If you are taking Tylenol  (acetaminophen ), please check any other over the counter medications you are taking to ensure they do not also contain acetaminophen . Taking too much Tylenol /acetaminophen  can lead to severe liver damage.  You were given Tylenol  here at 10:20am, your next dose can be at 4:20pm You were given ibuprofen  here today at 2:30 PM, next dose can be 8:30 PM   For cough: honey 1/2 to 1 teaspoon (you can dilute the honey in water  or another fluid).  You can also use guaifenesin  and dextromethorphan  for cough which are over-the-counter medications. You can use a humidifier for chest congestion and cough.  If you don't have a humidifier, you can sit in the bathroom with the hot shower running.      For sore throat: try warm salt water  gargles, cepacol lozenges, throat spray, warm tea or water  with lemon/honey, popsicles or ice, or OTC cold relief medicine for throat discomfort.    For congestion: Flonase  1-2 sprays in each nostril daily.    It is important to stay hydrated: drink plenty of fluids (water , gatorade/powerade/pedialyte, juices, or teas) to keep your throat moisturized and help further relieve irritation/discomfort.   You may return to normal activities (work/school) when:  - You are having improvement in symptoms  - AND have had resolution of fever without the use of fever-reducing medications for 24  hours  Follow-up instructions: Please follow-up with your primary care provider for further evaluation of your symptoms if you are not feeling better within the next 5 days.  Return instructions:  Please return to the Emergency Department if you experience worsening symptoms.  RETURN IMMEDIATELY IF you develop shortness of breath, confusion or altered mental status, a new rash, become dizzy, faint, or poorly responsive, or are unable to be cared for at home. Please return if you have persistent vomiting and cannot keep down fluids or develop a fever that is not controlled by tylenol  or motrin .   Please return if you have any other emergent concerns.

## 2024-09-03 NOTE — ED Triage Notes (Signed)
 Pt reports flulike s/s since Thursday including fever, body aches, sore throat, congestion, cough

## 2024-09-22 ENCOUNTER — Encounter: Payer: Self-pay | Admitting: Family Medicine

## 2024-09-22 ENCOUNTER — Ambulatory Visit: Admitting: Family Medicine

## 2024-09-22 DIAGNOSIS — E2839 Other primary ovarian failure: Secondary | ICD-10-CM

## 2024-09-22 MED ORDER — ESTRADIOL 1 MG PO TABS: 1.0000 mg | ORAL_TABLET | Freq: Every day | ORAL | 0 refills | Status: AC

## 2024-09-22 NOTE — Assessment & Plan Note (Signed)
 Will keep her estrogen at current dose and consider adjusting if symptoms worsen.

## 2024-09-22 NOTE — Progress Notes (Signed)
" ° °  Subjective:    Patient ID: Melissa Allen is a 59 y.o. female presenting with Medication Refill  on 09/22/2024  HPI: Here today for refill of her HRT. S/p hysterectomy. Doing well. 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 Reports still with some lingering hot flashes. On 1 mg Estradiol . Has adhesive allergies. Has a lot of pain.  Review of Systems  Constitutional:  Negative for chills and fever.  Respiratory:  Negative for shortness of breath.   Cardiovascular:  Negative for chest pain.  Gastrointestinal:  Negative for abdominal pain, nausea and vomiting.  Genitourinary:  Negative for dysuria.  Skin:  Negative for rash.      Objective:    BP 111/74   Pulse (!) 116   Wt 150 lb 9.6 oz (68.3 kg)   LMP 12/07/2011   BMI 26.68 kg/m  Physical Exam Exam conducted with a chaperone present.  Constitutional:      General: She is not in acute distress.    Appearance: She is well-developed.  HENT:     Head: Normocephalic and atraumatic.  Eyes:     General: No scleral icterus. Cardiovascular:     Rate and Rhythm: Normal rate.  Pulmonary:     Effort: Pulmonary effort is normal.  Abdominal:     Palpations: Abdomen is soft.  Musculoskeletal:     Cervical back: Neck supple.  Skin:    General: Skin is warm and dry.  Neurological:     Mental Status: She is alert and oriented to person, place, and time.         Assessment & Plan:    No follow-ups on file.  Glenys GORMAN Birk, MD 09/22/2024 11:26 AM   "

## 2024-10-07 ENCOUNTER — Emergency Department (HOSPITAL_BASED_OUTPATIENT_CLINIC_OR_DEPARTMENT_OTHER)

## 2024-10-07 ENCOUNTER — Emergency Department (HOSPITAL_BASED_OUTPATIENT_CLINIC_OR_DEPARTMENT_OTHER)
Admission: EM | Admit: 2024-10-07 | Discharge: 2024-10-08 | Disposition: A | Attending: Emergency Medicine | Admitting: Emergency Medicine

## 2024-10-07 ENCOUNTER — Other Ambulatory Visit: Payer: Self-pay

## 2024-10-07 ENCOUNTER — Emergency Department (HOSPITAL_BASED_OUTPATIENT_CLINIC_OR_DEPARTMENT_OTHER): Admitting: Radiology

## 2024-10-07 ENCOUNTER — Encounter (HOSPITAL_BASED_OUTPATIENT_CLINIC_OR_DEPARTMENT_OTHER): Payer: Self-pay | Admitting: Emergency Medicine

## 2024-10-07 DIAGNOSIS — S60222A Contusion of left hand, initial encounter: Secondary | ICD-10-CM | POA: Insufficient documentation

## 2024-10-07 DIAGNOSIS — S0990XA Unspecified injury of head, initial encounter: Secondary | ICD-10-CM | POA: Diagnosis present

## 2024-10-07 DIAGNOSIS — Z7984 Long term (current) use of oral hypoglycemic drugs: Secondary | ICD-10-CM | POA: Insufficient documentation

## 2024-10-07 DIAGNOSIS — S3210XA Unspecified fracture of sacrum, initial encounter for closed fracture: Secondary | ICD-10-CM | POA: Insufficient documentation

## 2024-10-07 DIAGNOSIS — E119 Type 2 diabetes mellitus without complications: Secondary | ICD-10-CM | POA: Diagnosis not present

## 2024-10-07 DIAGNOSIS — W01198A Fall on same level from slipping, tripping and stumbling with subsequent striking against other object, initial encounter: Secondary | ICD-10-CM | POA: Diagnosis not present

## 2024-10-07 DIAGNOSIS — S60221A Contusion of right hand, initial encounter: Secondary | ICD-10-CM | POA: Insufficient documentation

## 2024-10-07 DIAGNOSIS — Z853 Personal history of malignant neoplasm of breast: Secondary | ICD-10-CM | POA: Diagnosis not present

## 2024-10-07 DIAGNOSIS — Z91040 Latex allergy status: Secondary | ICD-10-CM | POA: Insufficient documentation

## 2024-10-07 DIAGNOSIS — Z7951 Long term (current) use of inhaled steroids: Secondary | ICD-10-CM | POA: Diagnosis not present

## 2024-10-07 DIAGNOSIS — Z79899 Other long term (current) drug therapy: Secondary | ICD-10-CM | POA: Insufficient documentation

## 2024-10-07 DIAGNOSIS — S065XAA Traumatic subdural hemorrhage with loss of consciousness status unknown, initial encounter: Secondary | ICD-10-CM

## 2024-10-07 DIAGNOSIS — S065X0A Traumatic subdural hemorrhage without loss of consciousness, initial encounter: Secondary | ICD-10-CM | POA: Diagnosis not present

## 2024-10-07 DIAGNOSIS — I10 Essential (primary) hypertension: Secondary | ICD-10-CM | POA: Insufficient documentation

## 2024-10-07 DIAGNOSIS — J45909 Unspecified asthma, uncomplicated: Secondary | ICD-10-CM | POA: Diagnosis not present

## 2024-10-07 MED ORDER — ACETAMINOPHEN 500 MG PO TABS
1000.0000 mg | ORAL_TABLET | Freq: Once | ORAL | Status: AC
  Administered 2024-10-07: 1000 mg via ORAL
  Filled 2024-10-07: qty 2

## 2024-10-07 MED ORDER — ONDANSETRON HCL 4 MG/2ML IJ SOLN
4.0000 mg | Freq: Once | INTRAMUSCULAR | Status: AC
  Administered 2024-10-08: 4 mg via INTRAVENOUS
  Filled 2024-10-07: qty 2

## 2024-10-07 MED ORDER — HYDROMORPHONE HCL 1 MG/ML IJ SOLN
0.5000 mg | Freq: Once | INTRAMUSCULAR | Status: AC
  Administered 2024-10-07: 0.5 mg via INTRAVENOUS
  Filled 2024-10-07: qty 1

## 2024-10-07 MED ORDER — FENTANYL CITRATE (PF) 50 MCG/ML IJ SOSY
50.0000 ug | PREFILLED_SYRINGE | Freq: Once | INTRAMUSCULAR | Status: AC
  Administered 2024-10-07: 50 ug via INTRAMUSCULAR
  Filled 2024-10-07: qty 1

## 2024-10-07 MED ORDER — ONDANSETRON 4 MG PO TBDP
4.0000 mg | ORAL_TABLET | Freq: Once | ORAL | Status: AC
  Administered 2024-10-07: 4 mg via ORAL
  Filled 2024-10-07: qty 1

## 2024-10-07 NOTE — ED Notes (Signed)
 ED Provider at bedside.

## 2024-10-07 NOTE — ED Triage Notes (Signed)
 Fall/ slip on ice happen around noon Hit back of head, lower back and palms Denies LOC

## 2024-10-07 NOTE — ED Notes (Signed)
 Provided pt with warm blanket

## 2024-10-07 NOTE — Discharge Instructions (Addendum)
 You were seen today for fall.  You do have a small subdural hematoma which is stable on repeat CT imaging.  It is very important that you follow-up with neurosurgery.  Take your pain medication at home as directed.  Regarding your tailbone injury, you may benefit from obtaining a donut pillow to sit on for comfort.

## 2024-10-07 NOTE — ED Notes (Signed)
 X-Ray at bedside.

## 2024-10-07 NOTE — ED Provider Notes (Signed)
 " [place] [facility] AT [facility] Provider Note   CSN: [id number] Arrival date & time: 10/07/24  1311     Patient presents with: Melissa Allen is a 59 y.o. female.  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   was drained w/ anesthesia   Calcaneal fracture    Cancer (HCC)    breast cancer   Depression    Diabetes mellitus without complication (HCC)    type 2, does not check blood sugar, on metformin    Duplicated ureter, right    Family history of breast cancer    Family history of colon cancer    Family history of ovarian cancer    Family history of pancreatic cancer    Fibromyalgia    History of kidney stones    Passed stones and surg to remove stones   Hypertension    Obesity (BMI 30.0-34.9)    Pancreatitis 1986   Pneumonia    x 1   PONV (postoperative nausea and vomiting)    Poor venous access    hard to start iv-   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 Authorizing Provider  fluticasone -salmeterol (ADVAIR) 250-50 MCG/ACT AEPB Inhale 1 puff into the lungs 2 (two) times daily. 09/05/24  Yes [provider]  albuterol  (PROAIR  HFA) 108 (90 Base) MCG/ACT inhaler INHALE TWO PUFFS EVERY FOUR HOURS AS NEEDED SHORTNESS OF BREATH 08/27/16   Norleen Lynwood ORN, MD  Dextromethorphan -buPROPion  ER  (AUVELITY ) 45-105 MG TBCR Take 1 tablet by mouth in the morning and at bedtime.    [provider]  doxepin  (SINEQUAN ) 25 MG capsule Take 75 mg by mouth at bedtime. 07/06/23   [provider]  DULoxetine  (CYMBALTA ) 60 MG capsule Take 120 mg by mouth daily. 05/22/23   [provider]  EPINEPHrine  0.3 mg/0.3 mL IJ SOAJ injection Inject 0.3 mLs (0.3 mg total) into the muscle as needed for anaphylaxis. 10/04/19   Alvia Bring, DO  estradiol  (ESTRACE ) 1 MG tablet Take 1 tablet (1 mg total) by mouth daily. 09/22/24   Fredirick Glenys GORMAN, MD  hydrochlorothiazide  (HYDRODIURIL ) 25 MG tablet Take 25 mg by mouth daily.    [provider]  losartan  (COZAAR ) 50 MG tablet Take 50 mg by mouth in the morning and at bedtime.    [provider]  MAGNESIUM  PO Take 1 capsule by mouth daily at 12 noon.    [provider]  metFORMIN  (GLUCOPHAGE -XR) 500 MG 24 hr tablet Take 500 mg by mouth 2 (two) times daily.    [provider]  Multiple Vitamin (MULTIVITAMIN WITH MINERALS) TABS tablet Take 1 tablet by mouth daily.    [provider]  omeprazole  (PRILOSEC) 40  MG capsule Take 1 capsule (40 mg total) by mouth 2 (two) times daily for 28 days. 07/21/23 09/22/24  Pahwani, Ravi, MD  PERCOCET 10-325 MG tablet Take 1 tablet by mouth every 4 (four) hours as needed. 03/09/24   [provider]  QUEtiapine  (SEROQUEL ) 400 MG tablet Take 400 mg by mouth at bedtime.    [provider]  tiZANidine  (ZANAFLEX ) 4 MG tablet Take 1 and 1/2 tablets by mouth 3 times a day as needed 11/06/22     triamterene -hydrochlorothiazide  (MAXZIDE -25) 37.5-25 MG tablet Take 1 tablet by mouth daily. 03/18/24   [provider]  zolpidem  (AMBIEN  CR) 12.5 MG CR tablet Take 12.5 mg by mouth at bedtime as needed.    [provider]    Allergies: Chlorquinaldol, Clioquinol, Other, Latex, Lisinopril, Codeine, Hydrocodone , Tape, and Wound dressing adhesive    Review  of Systems  Updated Vital Signs BP 125/83 (BP Location: Right Arm)   Pulse (!) 123   Temp 98.3 F (36.8 C)   Resp 18   LMP 12/07/2011   SpO2 99%   Physical Exam  (all labs ordered are listed, but only abnormal results are displayed) Labs Reviewed - No data to display  EKG: None  Radiology: No results found.  {Document cardiac monitor, telemetry assessment procedure when appropriate:32947} Procedures   Medications Ordered in the ED - No data to display    {Click here for ABCD2, HEART and other calculators REFRESH Note before signing:1}                              Medical Decision Making Amount and/or Complexity of Data Reviewed Radiology: ordered.   ***  {Document critical care time when appropriate  Document review of labs and clinical decision tools ie CHADS2VASC2, etc  Document your independent review of radiology images and any outside records  Document your discussion with family members, caretakers and with consultants  Document social determinants of health affecting pt's care  Document your decision making why or why not admission, treatments were needed:32947:::1}   Final diagnoses:  None    ED Discharge Orders     None        "

## 2024-10-08 ENCOUNTER — Emergency Department (HOSPITAL_BASED_OUTPATIENT_CLINIC_OR_DEPARTMENT_OTHER)

## 2024-10-08 MED ORDER — OXYCODONE-ACETAMINOPHEN 5-325 MG PO TABS
2.0000 | ORAL_TABLET | Freq: Once | ORAL | Status: AC
  Administered 2024-10-08: 2 via ORAL
  Filled 2024-10-08: qty 2

## 2024-10-08 MED ORDER — OXYCODONE-ACETAMINOPHEN 5-325 MG PO TABS
1.0000 | ORAL_TABLET | Freq: Once | ORAL | Status: AC
  Administered 2024-10-08: 1 via ORAL
  Filled 2024-10-08: qty 1

## 2024-10-08 NOTE — ED Provider Notes (Signed)
 Repeat CT imaging is stable without any progression of bleed.  Patient has remained stable.  She does report persistent headache.  Discussed with her that she may have an ongoing headache.  She has pain medication at home and is on a pain management plan.  She will follow-up with neurosurgery for plan.  Regarding COVID injury, recommend she get a donut pillow for comfort.  She has been ambulatory.   Bari Charmaine FALCON, MD 10/08/24 [phone]

## 2024-10-08 NOTE — ED Notes (Signed)
 Patient transported to CT
